# Patient Record
Sex: Female | Born: 1945 | Race: White | Hispanic: No | Smoking: Never smoker
Health system: Southern US, Community
[De-identification: ages and names within clinical notes are randomized; demographics above are authoritative.]

## PROBLEM LIST (undated history)

## (undated) DIAGNOSIS — T7840XA Allergy, unspecified, initial encounter: Secondary | ICD-10-CM

## (undated) DIAGNOSIS — C50919 Malignant neoplasm of unspecified site of unspecified female breast: Secondary | ICD-10-CM

## (undated) DIAGNOSIS — E079 Disorder of thyroid, unspecified: Secondary | ICD-10-CM

## (undated) DIAGNOSIS — K5790 Diverticulosis of intestine, part unspecified, without perforation or abscess without bleeding: Secondary | ICD-10-CM

## (undated) DIAGNOSIS — F419 Anxiety disorder, unspecified: Secondary | ICD-10-CM

## (undated) DIAGNOSIS — I499 Cardiac arrhythmia, unspecified: Secondary | ICD-10-CM

## (undated) DIAGNOSIS — R7303 Prediabetes: Secondary | ICD-10-CM

## (undated) DIAGNOSIS — G8929 Other chronic pain: Secondary | ICD-10-CM

## (undated) DIAGNOSIS — G4700 Insomnia, unspecified: Secondary | ICD-10-CM

## (undated) DIAGNOSIS — K219 Gastro-esophageal reflux disease without esophagitis: Secondary | ICD-10-CM

## (undated) DIAGNOSIS — M549 Dorsalgia, unspecified: Secondary | ICD-10-CM

## (undated) DIAGNOSIS — J3489 Other specified disorders of nose and nasal sinuses: Secondary | ICD-10-CM

## (undated) DIAGNOSIS — I480 Paroxysmal atrial fibrillation: Secondary | ICD-10-CM

## (undated) DIAGNOSIS — F329 Major depressive disorder, single episode, unspecified: Secondary | ICD-10-CM

## (undated) DIAGNOSIS — I428 Other cardiomyopathies: Secondary | ICD-10-CM

## (undated) DIAGNOSIS — R112 Nausea with vomiting, unspecified: Secondary | ICD-10-CM

## (undated) DIAGNOSIS — I509 Heart failure, unspecified: Secondary | ICD-10-CM

## (undated) DIAGNOSIS — B029 Zoster without complications: Secondary | ICD-10-CM

## (undated) DIAGNOSIS — R531 Weakness: Secondary | ICD-10-CM

## (undated) DIAGNOSIS — Z8719 Personal history of other diseases of the digestive system: Secondary | ICD-10-CM

## (undated) DIAGNOSIS — E039 Hypothyroidism, unspecified: Secondary | ICD-10-CM

## (undated) DIAGNOSIS — M1990 Unspecified osteoarthritis, unspecified site: Secondary | ICD-10-CM

## (undated) DIAGNOSIS — E669 Obesity, unspecified: Secondary | ICD-10-CM

## (undated) DIAGNOSIS — D649 Anemia, unspecified: Secondary | ICD-10-CM

## (undated) DIAGNOSIS — Z923 Personal history of irradiation: Secondary | ICD-10-CM

## (undated) DIAGNOSIS — M2550 Pain in unspecified joint: Secondary | ICD-10-CM

## (undated) DIAGNOSIS — Z9889 Other specified postprocedural states: Secondary | ICD-10-CM

## (undated) DIAGNOSIS — F32A Depression, unspecified: Secondary | ICD-10-CM

## (undated) DIAGNOSIS — Z973 Presence of spectacles and contact lenses: Secondary | ICD-10-CM

## (undated) DIAGNOSIS — R51 Headache: Secondary | ICD-10-CM

## (undated) DIAGNOSIS — E785 Hyperlipidemia, unspecified: Secondary | ICD-10-CM

## (undated) DIAGNOSIS — I10 Essential (primary) hypertension: Secondary | ICD-10-CM

## (undated) DIAGNOSIS — Z8619 Personal history of other infectious and parasitic diseases: Secondary | ICD-10-CM

## (undated) DIAGNOSIS — Z8711 Personal history of peptic ulcer disease: Secondary | ICD-10-CM

## (undated) HISTORY — DX: Anemia, unspecified: D64.9

## (undated) HISTORY — PX: CATARACT EXTRACTION BILATERAL W/ ANTERIOR VITRECTOMY: SHX1304

## (undated) HISTORY — DX: Obesity, unspecified: E66.9

## (undated) HISTORY — PX: TUBAL LIGATION: SHX77

## (undated) HISTORY — DX: Major depressive disorder, single episode, unspecified: F32.9

## (undated) HISTORY — DX: Hyperlipidemia, unspecified: E78.5

## (undated) HISTORY — DX: Allergy, unspecified, initial encounter: T78.40XA

## (undated) HISTORY — PX: ABDOMINAL HYSTERECTOMY: SHX81

## (undated) HISTORY — DX: Essential (primary) hypertension: I10

## (undated) HISTORY — PX: OTHER SURGICAL HISTORY: SHX169

## (undated) HISTORY — PX: KNEE ARTHROSCOPY: SUR90

## (undated) HISTORY — DX: Depression, unspecified: F32.A

## (undated) HISTORY — PX: CHOLECYSTECTOMY: SHX55

## (undated) HISTORY — PX: BACK SURGERY: SHX140

## (undated) HISTORY — PX: JOINT REPLACEMENT: SHX530

## (undated) HISTORY — DX: Anxiety disorder, unspecified: F41.9

## (undated) HISTORY — DX: Zoster without complications: B02.9

## (undated) HISTORY — PX: TONSILLECTOMY: SUR1361

## (undated) HISTORY — DX: Other cardiomyopathies: I42.8

## (undated) HISTORY — PX: FOOT TENDON SURGERY: SHX958

## (undated) HISTORY — DX: Gastro-esophageal reflux disease without esophagitis: K21.9

## (undated) HISTORY — DX: Unspecified osteoarthritis, unspecified site: M19.90

## (undated) HISTORY — PX: BREAST LUMPECTOMY: SHX2

## (undated) HISTORY — PX: UPPER GI ENDOSCOPY: SHX6162

## (undated) HISTORY — DX: Prediabetes: R73.03

## (undated) HISTORY — DX: Paroxysmal atrial fibrillation: I48.0

---

## 1998-06-23 ENCOUNTER — Ambulatory Visit (HOSPITAL_COMMUNITY): Admission: RE | Admit: 1998-06-23 | Discharge: 1998-06-23 | Payer: Self-pay | Admitting: *Deleted

## 1998-11-26 ENCOUNTER — Other Ambulatory Visit: Admission: RE | Admit: 1998-11-26 | Discharge: 1998-11-26 | Payer: Self-pay | Admitting: *Deleted

## 1999-01-19 ENCOUNTER — Other Ambulatory Visit: Admission: RE | Admit: 1999-01-19 | Discharge: 1999-01-19 | Payer: Self-pay | Admitting: Podiatry

## 1999-09-07 ENCOUNTER — Ambulatory Visit (HOSPITAL_COMMUNITY): Admission: RE | Admit: 1999-09-07 | Discharge: 1999-09-07 | Payer: Self-pay | Admitting: *Deleted

## 1999-12-30 ENCOUNTER — Other Ambulatory Visit: Admission: RE | Admit: 1999-12-30 | Discharge: 1999-12-30 | Payer: Self-pay | Admitting: *Deleted

## 2000-01-28 ENCOUNTER — Ambulatory Visit (HOSPITAL_COMMUNITY): Admission: RE | Admit: 2000-01-28 | Discharge: 2000-01-28 | Payer: Self-pay | Admitting: *Deleted

## 2003-01-15 ENCOUNTER — Encounter: Payer: Self-pay | Admitting: Gastroenterology

## 2003-07-01 ENCOUNTER — Ambulatory Visit (HOSPITAL_COMMUNITY): Admission: RE | Admit: 2003-07-01 | Discharge: 2003-07-01 | Payer: Self-pay | Admitting: Internal Medicine

## 2004-01-21 ENCOUNTER — Other Ambulatory Visit: Admission: RE | Admit: 2004-01-21 | Discharge: 2004-01-21 | Payer: Self-pay | Admitting: Internal Medicine

## 2004-01-27 ENCOUNTER — Ambulatory Visit (HOSPITAL_COMMUNITY): Admission: RE | Admit: 2004-01-27 | Discharge: 2004-01-27 | Payer: Self-pay | Admitting: Internal Medicine

## 2004-02-03 ENCOUNTER — Encounter: Admission: RE | Admit: 2004-02-03 | Discharge: 2004-02-03 | Payer: Self-pay | Admitting: Internal Medicine

## 2005-01-25 ENCOUNTER — Ambulatory Visit: Payer: Self-pay | Admitting: Cardiology

## 2005-01-28 ENCOUNTER — Encounter: Admission: RE | Admit: 2005-01-28 | Discharge: 2005-01-28 | Payer: Self-pay | Admitting: Internal Medicine

## 2005-02-09 ENCOUNTER — Ambulatory Visit: Payer: Self-pay

## 2005-02-11 ENCOUNTER — Encounter: Admission: RE | Admit: 2005-02-11 | Discharge: 2005-02-11 | Payer: Self-pay | Admitting: Internal Medicine

## 2005-04-11 HISTORY — PX: DUPUYTREN / PALMAR FASCIOTOMY: SUR601

## 2005-04-18 ENCOUNTER — Ambulatory Visit: Payer: Self-pay | Admitting: Cardiology

## 2005-10-20 ENCOUNTER — Encounter (INDEPENDENT_AMBULATORY_CARE_PROVIDER_SITE_OTHER): Payer: Self-pay | Admitting: Specialist

## 2005-10-20 ENCOUNTER — Ambulatory Visit (HOSPITAL_BASED_OUTPATIENT_CLINIC_OR_DEPARTMENT_OTHER): Admission: RE | Admit: 2005-10-20 | Discharge: 2005-10-20 | Payer: Self-pay | Admitting: Orthopedic Surgery

## 2006-02-22 ENCOUNTER — Encounter: Admission: RE | Admit: 2006-02-22 | Discharge: 2006-02-22 | Payer: Self-pay | Admitting: Internal Medicine

## 2006-03-27 ENCOUNTER — Ambulatory Visit: Payer: Self-pay | Admitting: Cardiology

## 2006-08-10 ENCOUNTER — Encounter: Admission: RE | Admit: 2006-08-10 | Discharge: 2006-08-10 | Payer: Self-pay | Admitting: Internal Medicine

## 2006-08-15 ENCOUNTER — Encounter: Payer: Self-pay | Admitting: Internal Medicine

## 2006-08-15 ENCOUNTER — Ambulatory Visit: Payer: Self-pay

## 2007-02-26 ENCOUNTER — Encounter: Admission: RE | Admit: 2007-02-26 | Discharge: 2007-02-26 | Payer: Self-pay | Admitting: Internal Medicine

## 2007-03-01 ENCOUNTER — Encounter: Admission: RE | Admit: 2007-03-01 | Discharge: 2007-03-01 | Payer: Self-pay | Admitting: Internal Medicine

## 2007-05-04 ENCOUNTER — Ambulatory Visit (HOSPITAL_BASED_OUTPATIENT_CLINIC_OR_DEPARTMENT_OTHER): Admission: RE | Admit: 2007-05-04 | Discharge: 2007-05-04 | Payer: Self-pay | Admitting: Orthopedic Surgery

## 2008-02-27 ENCOUNTER — Encounter: Admission: RE | Admit: 2008-02-27 | Discharge: 2008-02-27 | Payer: Self-pay | Admitting: Internal Medicine

## 2008-03-11 ENCOUNTER — Encounter: Payer: Self-pay | Admitting: Physician Assistant

## 2008-03-13 ENCOUNTER — Ambulatory Visit: Payer: Self-pay | Admitting: Cardiology

## 2008-03-13 ENCOUNTER — Telehealth: Payer: Self-pay | Admitting: Gastroenterology

## 2008-03-13 ENCOUNTER — Ambulatory Visit: Payer: Self-pay | Admitting: Gastroenterology

## 2008-03-13 DIAGNOSIS — I447 Left bundle-branch block, unspecified: Secondary | ICD-10-CM | POA: Insufficient documentation

## 2008-03-13 DIAGNOSIS — I10 Essential (primary) hypertension: Secondary | ICD-10-CM | POA: Insufficient documentation

## 2008-03-13 DIAGNOSIS — Z8679 Personal history of other diseases of the circulatory system: Secondary | ICD-10-CM

## 2008-03-13 DIAGNOSIS — E782 Mixed hyperlipidemia: Secondary | ICD-10-CM | POA: Insufficient documentation

## 2008-03-13 DIAGNOSIS — J309 Allergic rhinitis, unspecified: Secondary | ICD-10-CM | POA: Insufficient documentation

## 2008-03-13 DIAGNOSIS — F3341 Major depressive disorder, recurrent, in partial remission: Secondary | ICD-10-CM | POA: Insufficient documentation

## 2008-03-13 DIAGNOSIS — K219 Gastro-esophageal reflux disease without esophagitis: Secondary | ICD-10-CM

## 2008-03-13 LAB — CONVERTED CEMR LAB
Amylase: 42 units/L (ref 27–131)
BUN: 14 mg/dL (ref 6–23)
Basophils Absolute: 0 10*3/uL (ref 0.0–0.1)
Basophils Relative: 0 % (ref 0.0–3.0)
CO2: 30 meq/L (ref 19–32)
Calcium: 8.9 mg/dL (ref 8.4–10.5)
Chloride: 99 meq/L (ref 96–112)
Creatinine, Ser: 0.9 mg/dL (ref 0.4–1.2)
Eosinophils Absolute: 0.1 10*3/uL (ref 0.0–0.7)
Eosinophils Relative: 0.6 % (ref 0.0–5.0)
GFR calc Af Amer: 82 mL/min
GFR calc non Af Amer: 68 mL/min
Glucose, Bld: 98 mg/dL (ref 70–99)
HCT: 31.9 % — ABNORMAL LOW (ref 36.0–46.0)
Hemoglobin: 10.9 g/dL — ABNORMAL LOW (ref 12.0–15.0)
Lipase: 29 units/L (ref 11.0–59.0)
Lymphocytes Relative: 15.6 % (ref 12.0–46.0)
MCHC: 34.2 g/dL (ref 30.0–36.0)
MCV: 85 fL (ref 78.0–100.0)
Monocytes Absolute: 1 10*3/uL (ref 0.1–1.0)
Monocytes Relative: 7 % (ref 3.0–12.0)
Neutro Abs: 10.9 10*3/uL — ABNORMAL HIGH (ref 1.4–7.7)
Neutrophils Relative %: 76.8 % (ref 43.0–77.0)
Platelets: 372 10*3/uL (ref 150–400)
Potassium: 3.2 meq/L — ABNORMAL LOW (ref 3.5–5.1)
RBC: 3.75 M/uL — ABNORMAL LOW (ref 3.87–5.11)
RDW: 13.8 % (ref 11.5–14.6)
Sodium: 137 meq/L (ref 135–145)
WBC: 14.2 10*3/uL — ABNORMAL HIGH (ref 4.5–10.5)

## 2008-03-14 ENCOUNTER — Ambulatory Visit: Payer: Self-pay | Admitting: Internal Medicine

## 2008-03-19 ENCOUNTER — Telehealth: Payer: Self-pay | Admitting: Physician Assistant

## 2008-03-20 ENCOUNTER — Telehealth (INDEPENDENT_AMBULATORY_CARE_PROVIDER_SITE_OTHER): Payer: Self-pay | Admitting: *Deleted

## 2008-03-21 ENCOUNTER — Ambulatory Visit (HOSPITAL_COMMUNITY): Admission: RE | Admit: 2008-03-21 | Discharge: 2008-03-21 | Payer: Self-pay | Admitting: Internal Medicine

## 2008-03-27 ENCOUNTER — Ambulatory Visit: Payer: Self-pay | Admitting: Internal Medicine

## 2008-03-27 ENCOUNTER — Encounter: Payer: Self-pay | Admitting: Physician Assistant

## 2008-03-27 DIAGNOSIS — Z8719 Personal history of other diseases of the digestive system: Secondary | ICD-10-CM

## 2008-03-27 HISTORY — DX: Personal history of other diseases of the digestive system: Z87.19

## 2008-03-28 ENCOUNTER — Encounter: Payer: Self-pay | Admitting: Gastroenterology

## 2008-03-31 ENCOUNTER — Ambulatory Visit: Payer: Self-pay | Admitting: Gastroenterology

## 2008-03-31 LAB — CONVERTED CEMR LAB
ALT: 24 units/L (ref 0–35)
AST: 20 units/L (ref 0–37)
Albumin: 3.2 g/dL — ABNORMAL LOW (ref 3.5–5.2)
Alkaline Phosphatase: 95 units/L (ref 39–117)
Amylase: 47 units/L (ref 27–131)
Basophils Absolute: 0.1 10*3/uL (ref 0.0–0.1)
Basophils Relative: 1.2 % (ref 0.0–3.0)
Bilirubin, Direct: 0.1 mg/dL (ref 0.0–0.3)
Eosinophils Absolute: 0.1 10*3/uL (ref 0.0–0.7)
Eosinophils Relative: 1.8 % (ref 0.0–5.0)
HCT: 27.5 % — ABNORMAL LOW (ref 36.0–46.0)
Hemoglobin: 9.4 g/dL — ABNORMAL LOW (ref 12.0–15.0)
Lipase: 36 units/L (ref 11.0–59.0)
Lymphocytes Relative: 26.1 % (ref 12.0–46.0)
MCHC: 34.2 g/dL (ref 30.0–36.0)
MCV: 84.2 fL (ref 78.0–100.0)
Monocytes Absolute: 0.6 10*3/uL (ref 0.1–1.0)
Monocytes Relative: 7.1 % (ref 3.0–12.0)
Neutro Abs: 5.1 10*3/uL (ref 1.4–7.7)
Neutrophils Relative %: 63.8 % (ref 43.0–77.0)
Platelets: 345 10*3/uL (ref 150–400)
RBC: 3.26 M/uL — ABNORMAL LOW (ref 3.87–5.11)
RDW: 14 % (ref 11.5–14.6)
Total Bilirubin: 0.2 mg/dL — ABNORMAL LOW (ref 0.3–1.2)
Total Protein: 6.6 g/dL (ref 6.0–8.3)
WBC: 8 10*3/uL (ref 4.5–10.5)

## 2008-04-01 ENCOUNTER — Encounter: Payer: Self-pay | Admitting: Gastroenterology

## 2008-04-01 ENCOUNTER — Ambulatory Visit: Payer: Self-pay | Admitting: Gastroenterology

## 2008-04-01 LAB — CONVERTED CEMR LAB
Basophils Absolute: 0.1 10*3/uL (ref 0.0–0.1)
Basophils Relative: 1.1 % (ref 0.0–3.0)
Eosinophils Absolute: 0.1 10*3/uL (ref 0.0–0.7)
Eosinophils Relative: 1.3 % (ref 0.0–5.0)
Ferritin: 20.7 ng/mL (ref 10.0–291.0)
Folate: 6.8 ng/mL
HCT: 29.8 % — ABNORMAL LOW (ref 36.0–46.0)
Hemoglobin: 10.2 g/dL — ABNORMAL LOW (ref 12.0–15.0)
Iron: 41 ug/dL — ABNORMAL LOW (ref 42–145)
Lymphocytes Relative: 24.8 % (ref 12.0–46.0)
MCHC: 34.1 g/dL (ref 30.0–36.0)
MCV: 86.4 fL (ref 78.0–100.0)
Monocytes Absolute: 0.5 10*3/uL (ref 0.1–1.0)
Monocytes Relative: 5.7 % (ref 3.0–12.0)
Neutro Abs: 5.7 10*3/uL (ref 1.4–7.7)
Neutrophils Relative %: 67.1 % (ref 43.0–77.0)
Platelets: 356 10*3/uL (ref 150–400)
RBC: 3.45 M/uL — ABNORMAL LOW (ref 3.87–5.11)
RDW: 14.3 % (ref 11.5–14.6)
Saturation Ratios: 9.1 % — ABNORMAL LOW (ref 20.0–50.0)
Transferrin: 323 mg/dL (ref >=212.0)
Vitamin B-12: 244 pg/mL (ref 211–911)
WBC: 8.5 10*3/uL (ref 4.5–10.5)

## 2008-04-02 ENCOUNTER — Telehealth (INDEPENDENT_AMBULATORY_CARE_PROVIDER_SITE_OTHER): Payer: Self-pay | Admitting: *Deleted

## 2008-04-03 ENCOUNTER — Encounter: Payer: Self-pay | Admitting: Gastroenterology

## 2008-04-10 ENCOUNTER — Ambulatory Visit (HOSPITAL_COMMUNITY): Admission: RE | Admit: 2008-04-10 | Discharge: 2008-04-10 | Payer: Self-pay | Admitting: Gastroenterology

## 2008-04-14 ENCOUNTER — Encounter: Payer: Self-pay | Admitting: Gastroenterology

## 2008-04-17 ENCOUNTER — Ambulatory Visit (HOSPITAL_COMMUNITY): Admission: RE | Admit: 2008-04-17 | Discharge: 2008-04-17 | Payer: Self-pay | Admitting: Gastroenterology

## 2008-04-17 ENCOUNTER — Ambulatory Visit: Payer: Self-pay | Admitting: Gastroenterology

## 2008-04-23 ENCOUNTER — Ambulatory Visit: Payer: Self-pay | Admitting: Gastroenterology

## 2008-04-23 ENCOUNTER — Encounter (INDEPENDENT_AMBULATORY_CARE_PROVIDER_SITE_OTHER): Payer: Self-pay | Admitting: *Deleted

## 2008-04-23 LAB — CONVERTED CEMR LAB
Amylase: 40 units/L (ref 27–131)
Basophils Absolute: 0.1 10*3/uL (ref 0.0–0.1)
Basophils Relative: 1.1 % (ref 0.0–3.0)
Eosinophils Absolute: 0.2 10*3/uL (ref 0.0–0.7)
Eosinophils Relative: 2.4 % (ref 0.0–5.0)
HCT: 29.3 % — ABNORMAL LOW (ref 36.0–46.0)
Hemoglobin: 9.9 g/dL — ABNORMAL LOW (ref 12.0–15.0)
Lymphocytes Relative: 22.9 % (ref 12.0–46.0)
MCHC: 33.8 g/dL (ref 30.0–36.0)
MCV: 85.2 fL (ref 78.0–100.0)
Monocytes Absolute: 0.6 10*3/uL (ref 0.1–1.0)
Monocytes Relative: 7.5 % (ref 3.0–12.0)
Neutro Abs: 5.6 10*3/uL (ref 1.4–7.7)
Neutrophils Relative %: 66.1 % (ref 43.0–77.0)
Platelets: 292 10*3/uL (ref 150–400)
RBC: 3.44 M/uL — ABNORMAL LOW (ref 3.87–5.11)
RDW: 14.3 % (ref 11.5–14.6)
WBC: 8.4 10*3/uL (ref 4.5–10.5)

## 2008-04-25 ENCOUNTER — Ambulatory Visit: Payer: Self-pay | Admitting: Gastroenterology

## 2008-04-25 LAB — CONVERTED CEMR LAB
Fecal Occult Blood: NEGATIVE
OCCULT 1: NEGATIVE
OCCULT 2: NEGATIVE
OCCULT 3: NEGATIVE
OCCULT 4: NEGATIVE
OCCULT 5: NEGATIVE

## 2008-06-06 ENCOUNTER — Ambulatory Visit: Payer: Self-pay | Admitting: Gastroenterology

## 2008-06-06 LAB — CONVERTED CEMR LAB
ALT: 16 units/L (ref 0–35)
AST: 19 units/L (ref 0–37)
Albumin: 3.5 g/dL (ref 3.5–5.2)
Alkaline Phosphatase: 102 units/L (ref 39–117)
Amylase: 49 units/L (ref 27–131)
Basophils Absolute: 0.1 10*3/uL (ref 0.0–0.1)
Basophils Relative: 1.5 % (ref 0.0–3.0)
Bilirubin, Direct: 0.1 mg/dL (ref 0.0–0.3)
Eosinophils Absolute: 0.2 10*3/uL (ref 0.0–0.7)
Eosinophils Relative: 2.2 % (ref 0.0–5.0)
HCT: 32 % — ABNORMAL LOW (ref 36.0–46.0)
Hemoglobin: 10.8 g/dL — ABNORMAL LOW (ref 12.0–15.0)
Lipase: 26 units/L (ref 11.0–59.0)
Lymphocytes Relative: 22.3 % (ref 12.0–46.0)
MCHC: 33.7 g/dL (ref 30.0–36.0)
MCV: 84.3 fL (ref 78.0–100.0)
Monocytes Absolute: 0.7 10*3/uL (ref 0.1–1.0)
Monocytes Relative: 7.5 % (ref 3.0–12.0)
Neutro Abs: 5.8 10*3/uL (ref 1.4–7.7)
Neutrophils Relative %: 66.5 % (ref 43.0–77.0)
Platelets: 300 10*3/uL (ref 150–400)
RBC: 3.79 M/uL — ABNORMAL LOW (ref 3.87–5.11)
RDW: 14.1 % (ref 11.5–14.6)
Total Bilirubin: 0.5 mg/dL (ref 0.3–1.2)
Total Protein: 7.4 g/dL (ref 6.0–8.3)
WBC: 8.8 10*3/uL (ref 4.5–10.5)

## 2008-06-09 ENCOUNTER — Encounter: Payer: Self-pay | Admitting: Gastroenterology

## 2008-06-09 ENCOUNTER — Ambulatory Visit: Payer: Self-pay | Admitting: Cardiovascular Disease

## 2008-07-09 ENCOUNTER — Ambulatory Visit: Payer: Self-pay | Admitting: Gastroenterology

## 2008-09-16 ENCOUNTER — Encounter (INDEPENDENT_AMBULATORY_CARE_PROVIDER_SITE_OTHER): Payer: Self-pay | Admitting: *Deleted

## 2008-10-20 ENCOUNTER — Ambulatory Visit: Payer: Self-pay | Admitting: Gastroenterology

## 2008-11-03 ENCOUNTER — Ambulatory Visit (HOSPITAL_COMMUNITY): Admission: RE | Admit: 2008-11-03 | Discharge: 2008-11-03 | Payer: Self-pay | Admitting: Gastroenterology

## 2008-11-05 ENCOUNTER — Ambulatory Visit: Payer: Self-pay | Admitting: Gastroenterology

## 2008-11-25 ENCOUNTER — Encounter: Payer: Self-pay | Admitting: Gastroenterology

## 2009-03-12 ENCOUNTER — Other Ambulatory Visit: Admission: RE | Admit: 2009-03-12 | Discharge: 2009-03-12 | Payer: Self-pay | Admitting: Internal Medicine

## 2009-03-12 ENCOUNTER — Encounter: Payer: Self-pay | Admitting: Nurse Practitioner

## 2009-03-19 ENCOUNTER — Encounter: Admission: RE | Admit: 2009-03-19 | Discharge: 2009-03-19 | Payer: Self-pay | Admitting: Internal Medicine

## 2009-06-09 ENCOUNTER — Encounter: Payer: Self-pay | Admitting: Nurse Practitioner

## 2009-06-16 ENCOUNTER — Encounter: Payer: Self-pay | Admitting: Nurse Practitioner

## 2009-06-19 ENCOUNTER — Telehealth: Payer: Self-pay | Admitting: Gastroenterology

## 2009-06-22 ENCOUNTER — Ambulatory Visit: Payer: Self-pay | Admitting: Gastroenterology

## 2009-07-29 ENCOUNTER — Ambulatory Visit: Payer: Self-pay | Admitting: Gastroenterology

## 2009-08-04 ENCOUNTER — Encounter: Payer: Self-pay | Admitting: Gastroenterology

## 2009-08-19 ENCOUNTER — Ambulatory Visit (HOSPITAL_COMMUNITY): Admission: RE | Admit: 2009-08-19 | Discharge: 2009-08-19 | Payer: Self-pay | Admitting: General Surgery

## 2009-08-31 ENCOUNTER — Encounter: Payer: Self-pay | Admitting: Gastroenterology

## 2010-03-30 ENCOUNTER — Ambulatory Visit (HOSPITAL_COMMUNITY)
Admission: RE | Admit: 2010-03-30 | Discharge: 2010-03-30 | Payer: Self-pay | Source: Home / Self Care | Attending: Internal Medicine | Admitting: Internal Medicine

## 2010-05-02 ENCOUNTER — Encounter: Payer: Self-pay | Admitting: Internal Medicine

## 2010-05-13 NOTE — Letter (Signed)
Summary: [facility]  [facility]   Imported By: Lester [place] 06/24/2009 09:57:10  _____________________________________________________________________  External Attachment:    Type:   Image     Comment:   External Document

## 2010-05-13 NOTE — Assessment & Plan Note (Signed)
Summary: DIARRHEA X2WKS/YF               (DR.KAPLAN PT.)   History of Present Illness Visit Type: Follow-up Visit Primary GI MD: Melvia Heaps MD [facility] Primary Provider: Marisue Brooklyn, DO Requesting Provider: NA Chief Complaint: diarrhea x 2 weeks that has stopped with medication, stool were bright pink in color History of Present Illness:   Presents with two week history of diarrhea.  We gave patient samples of pancreatic enzymes in July after she presented with diarrhea following episode of acute pancreatitis. Diarrhea resolved, she discontinued enzymes. For at least a year now patient has continued to have intermittent epigastric discomfort radiating through to her back. Pain not any worse nor are episodes any more frequent than when she saw Dr. Arlyce Dice several months ago. Over the last few months has had occasional loose stool but two weeks ago developed increased abdominal pain and diarrhea. She had low grade fevers and just didn't feel well. No recent antibiotics. No sick contacts. Patient saw her PCP and LFTs, CBC and Lipase were normal.  Friday she restarted Creon and diarrhea has completely resolved.      GI Review of Systems    Reports bloating.     Location of  Abdominal pain: upper abdomen.    Denies abdominal pain, acid reflux, belching, chest pain, dysphagia with liquids, dysphagia with solids, heartburn, loss of appetite, nausea, vomiting, vomiting blood, weight loss, and  weight gain.        Denies anal fissure, black tarry stools, change in bowel habit, constipation, diarrhea, diverticulosis, fecal incontinence, heme positive stool, hemorrhoids, irritable bowel syndrome, jaundice, light color stool, liver problems, rectal bleeding, and  rectal pain.   Current Medications (verified): 1)  Verapamil Hcl Cr 180 Mg Cr-Tabs (Verapamil Hcl) .... Take 1 Tablet By Mouth Once A Day 2)  Temazepam 30 Mg Caps (Temazepam) .... Take 1 Tab By Mouth At Bedtime 3)  Doxazosin Mesylate 2 Mg Tabs  (Doxazosin Mesylate) .... Take 1 1/2 Tablet By Mouth Once Daily 4)  Omeprazole 20 Mg Cpdr (Omeprazole) .... Take 1 Tab Daily 5)  Paxil Cr 25 Mg Xr24h-Tab (Paroxetine Hcl) .... Take 1 Daily 6)  Promethazine Hcl 25 Mg Tabs (Promethazine Hcl) .... Take 1 Tab Every 4-6 Hours For Nausea and Vomiting 7)  Tramadol Hcl 50 Mg Tabs (Tramadol Hcl) .... Take 2 Tablets By Mouth Once Daily 8)  Vitamin D 2000 Unit Tabs (Cholecalciferol) .... Take 3 Tablets By Mouth Once Daily 9)  Creon 24000 Unit Cpep (Pancrelipase (Lip-Prot-Amyl)) .... Take One By Mouth With Meals 10)  Hyomax-Dt 0.375 Mg Cr-Tabs (Hyoscyamine Sulfate) .... Take One By Mouth Two Times A Day 11)  Flax Seed Oil 1000 Mg Caps (Flaxseed (Linseed)) .... Take 2 Tablet By Mouth Daily  Allergies (verified): 1)  ! Morphine Sulfate Cr (Morphine Sulfate) 2)  ! * Minocycline  Past History:  Past Medical History: Hx of LEFT BUNDLE BRANCH BLOCK (ICD-426.3) Family Hx of CARDIOVASCULAR DISEASE (ICD-429.2) DYSLIPIDEMIA (ICD-272.4) HYPERTENSION (ICD-401.9) DIVERTICULOSIS, COLON (ICD-562.10) ACUTE PANCREATITS 2009 (? thiazides) DEPRESSION  Past Surgical History: Reviewed history from 06/06/2008 and no changes required. Total abs hysterectomy with bilateral salpingo-oophorectomy Tonsillectomy right foot surgery x 2 right hand surgery x 1 trigger finger release-right hand 5 digit -left hand surgery x 3 right knee arthroscopy dental surgery lumpectomy-left breast-benign  Family History: Reviewed history from 06/06/2008 and no changes required. Family History of Breast Cancer:sister No FH of Colon Cancer: Family History of Diabetes: Brother, Sister x  2 Family History of Heart Disease: Mother Family History of Kidney Disease: Father  Social History: Reviewed history from 06/06/2008 and no changes required. Occupation: unemployed Patient has never smoked.  Alcohol Use - yes social Daily Caffeine Use 1 per day Illicit Drug Use - no Patient  does not get regular exercise.   Review of Systems       The patient complains of allergy/sinus, arthritis/joint pain, back pain, cough, fever, headaches-new, night sweats, sleeping problems, swelling of feet/legs, thirst - excessive, and urine leakage.  The patient denies anemia, anxiety-new, blood in urine, breast changes/lumps, change in vision, confusion, depression-new, fainting, fatigue, hearing problems, heart murmur, heart rhythm changes, itching, menstrual pain, muscle pains/cramps, nosebleeds, pregnancy symptoms, shortness of breath, skin rash, sore throat, swollen lymph glands, thirst - excessive , urination - excessive , urination changes/pain, vision changes, and voice change.    Vital Signs:  Patient profile:   65 year old female Height:      66 inches Weight:      233 pounds Pulse rate:   130 / minute Pulse rhythm:   regular BP sitting:   124 / 78  (left arm) Cuff size:   regular  Vitals Entered By: June McMurray CMA Duncan Dull) (June 22, 2009 11:08 AM)  Physical Exam  General:  Well developed, well nourished, no acute distress. Lungs:  Clear throughout to auscultation. Heart:  Regular rate and rhythm; no murmurs, rubs,  or bruits. Abdomen:  Abdomen soft,  nondistended, mild epigastric and LUQ tenderness to palpation. No obvious masses or hepatomegaly.Normal bowel sounds.  Neurologic:  Alert and  oriented x4;  grossly normal neurologically. Psych:  Alert and cooperative. Normal mood and affect.  Impression & Recommendations:  Problem # 1:  DIARRHEA (ICD-787.91) Assessment Improved Recent episode of diarrhea, abdominal pain, low grade fevers and malaise. Saw PCP and LFTs, Amylase, Lipase, BMET and UA unrmarkable. Hgb stable at 10. 2.  WBC normal. Minimally elevated ESR / CRP.  We called in Creon on Friday and she feels much better.  Patient may of had a viral illness which would have improved on its own. Not sure why pancreatic enzymes would help in the absence of chronic  pancreatitis / malabsorption.  I recommended weaning off Creon to see if diarrhea returns. See #2.  Problem # 2:  Hx of PANCREATITIS, HX OF (ICD-V12.70) Episode Dec. 2009 felt to be secondary to thiazides. She continues to have intermittent epigastric discomfort but nothing severe. She will follow up with Dr. Arlyce Dice in a few weeks. In the meantime, should the patient have any episodes of severe epigastric pain we will obtain CTscan. In the event of recurrent pancreatitis condsider MRCP or empric cholecystecomy for possible microlithiasis.  Patient Instructions: 1)  Over the next 7-10 days discontinue Creon. If diarrhea returns then resume medication. 2)  We made you a follow up appointment with Dr. Arlyce Dice for 07-29-09 at 9:45am.   3)  If upper abdominal pain increases, call our office for labs and CTscan.  4)  Copy sent to : Marisue Brooklyn, DO 5)  The medication list was reviewed and reconciled.  All changed / newly prescribed medications were explained.  A complete medication list was provided to the patient / caregiver.

## 2010-05-13 NOTE — Progress Notes (Signed)
Summary: TRIAGE: Diarrhea x2wks  Phone Note From Other Clinic Call back at [facility] [place] Phone [phone]   Caller: Darel Hong @ Dr Hardie Pulley office [phone] Call For: Dr Arlyce Dice Reason for Call: Schedule Patient Appt Summary of Call: Diarrhea x2wks. Would like pt worked in sooner than next available on 07-15-09. Would like for [place] to call patient back directly since she is established with [place] already and Dr Hardie Pulley office is only open half day today. Initial call taken by: Leanor Kail [facility],  June 19, 2009 9:27 AM  Follow-up for Phone Call        Last OV 10/2008.  For 3 weeks pt. states that every other day she gets up with diarrhea, stools are loose to watery, "Sometimes it's like an explosion"  Fever on tuesday at Dr.Stevenson's. Some nausea. Denies blood, black stools. Takes Hyomax two times a day, as needed.   1) Begin Hyomax two times a day for 5 days 2) Immodium QAM and PRN 3) See Willette Cluster NP on 06-22-09 at 11am 4) If symptoms become worse call back immediately or go to ER.  Follow-up by: Laureen Ochs LPN,  June 19, 2009 9:51 AM  Additional Follow-up for Phone Call Additional follow up Details #1::        she should restart creon if not already taking it Additional Follow-up by: Louis Meckel MD,  June 19, 2009 10:04 AM    Additional Follow-up for Phone Call Additional follow up Details #2::    Above MD orders reviewed with patient. Refill to her pharmacy. Pt. to keep scheduled office visit. Pt. instructed to call back as needed.  Follow-up by: Laureen Ochs LPN,  June 19, 2009 10:49 AM  Prescriptions: CREON 24000 UNIT CPEP (PANCRELIPASE (LIP-PROT-AMYL)) take one by mouth with meals  #90 x 3   Entered by:   Laureen Ochs LPN   Authorized by:   Louis Meckel MD   Signed by:   Laureen Ochs LPN on 46/96/2952   Method used:   Electronically to        [facility]  [phone]* (retail)       [address] [place], [place]  [address]       Ph:  [phone] or [phone]       Fax: [phone]   RxID:   [id number]

## 2010-05-13 NOTE — Letter (Signed)
Summary: [facility]   Imported By: Sherian Rein 08/18/2009 09:36:42  _____________________________________________________________________  External Attachment:    Type:   Image     Comment:   External Document

## 2010-05-13 NOTE — Letter (Signed)
Summary: [facility]   Imported By: Lester Vergas 09/23/2009 09:50:44  _____________________________________________________________________  External Attachment:    Type:   Image     Comment:   External Document

## 2010-05-13 NOTE — Assessment & Plan Note (Signed)
Summary: F/U Diarrhea, saw NP   History of Present Illness Visit Type: Follow-up Visit Primary GI MD: Melvia Heaps MD [facility] Primary Provider: Marisue Brooklyn, DO Requesting Provider: NA Chief Complaint: F/u diarrhea. Pt states diarrhea is better but she is having upper abd pain that radiates to her back  History of Present Illness:   Mrs. Neace has returned for followup of her abdominal pain and diarrhea.  Diarrhea has subsided.  She continues to complain of moderately severe midepigastric pain that radiates to her back.  It will occasionally awaken her.  Pain subsides with hyomax.  It may last up to 4 hours at a time.  Prior HIDA scan and ultrasound were negative.     GI Review of Systems    Reports abdominal pain.     Location of  Abdominal pain: upper abdomen.    Denies acid reflux, belching, bloating, chest pain, dysphagia with liquids, dysphagia with solids, heartburn, loss of appetite, nausea, vomiting, vomiting blood, weight loss, and  weight gain.        Denies anal fissure, black tarry stools, change in bowel habit, constipation, diarrhea, diverticulosis, fecal incontinence, heme positive stool, hemorrhoids, irritable bowel syndrome, jaundice, light color stool, liver problems, rectal bleeding, and  rectal pain.    Current Medications (verified): 1)  Verapamil Hcl Cr 180 Mg Cr-Tabs (Verapamil Hcl) .... Take 1 Tablet By Mouth Once A Day 2)  Temazepam 30 Mg Caps (Temazepam) .... Take 1 Tab By Mouth At Bedtime 3)  Doxazosin Mesylate 2 Mg Tabs (Doxazosin Mesylate) .... Take 1 1/2 Tablet By Mouth Once Daily 4)  Omeprazole 20 Mg Cpdr (Omeprazole) .... Take 1 Tab Daily 5)  Paxil Cr 25 Mg Xr24h-Tab (Paroxetine Hcl) .... Take 1 Daily 6)  Promethazine Hcl 25 Mg Tabs (Promethazine Hcl) .... Take 1 Tab Every 4-6 Hours For Nausea and Vomiting 7)  Tramadol Hcl 50 Mg Tabs (Tramadol Hcl) .... Take 2 Tablets By Mouth Once Daily 8)  Vitamin D 2000 Unit Tabs (Cholecalciferol) .... Take 3 Tablets  By Mouth Once Daily 9)  Creon 24000 Unit Cpep (Pancrelipase (Lip-Prot-Amyl)) .... Take One By Mouth With Meals 10)  Hyomax-Dt 0.375 Mg Cr-Tabs (Hyoscyamine Sulfate) .... Take One By Mouth Two Times A Day 11)  Flax Seed Oil 1000 Mg Caps (Flaxseed (Linseed)) .... Take 2 Tablet By Mouth Daily  Allergies (verified): 1)  ! Morphine Sulfate Cr (Morphine Sulfate) 2)  ! * Minocycline  Past History:  Past Medical History: Reviewed history from 06/22/2009 and no changes required. Hx of LEFT BUNDLE BRANCH BLOCK (ICD-426.3) Family Hx of CARDIOVASCULAR DISEASE (ICD-429.2) DYSLIPIDEMIA (ICD-272.4) HYPERTENSION (ICD-401.9) DIVERTICULOSIS, COLON (ICD-562.10) ACUTE PANCREATITS 2009 (? thiazides) DEPRESSION  Past Surgical History: Reviewed history from 06/06/2008 and no changes required. Total abs hysterectomy with bilateral salpingo-oophorectomy Tonsillectomy right foot surgery x 2 right hand surgery x 1 trigger finger release-right hand 5 digit -left hand surgery x 3 right knee arthroscopy dental surgery lumpectomy-left breast-benign  Family History: Reviewed history from 06/06/2008 and no changes required. Family History of Breast Cancer:sister No FH of Colon Cancer: Family History of Diabetes: Brother, Sister x 2 Family History of Heart Disease: Mother Family History of Kidney Disease: Father  Social History: Reviewed history from 06/06/2008 and no changes required. Occupation: unemployed Patient has never smoked.  Alcohol Use - yes social Daily Caffeine Use 1 per day Illicit Drug Use - no Patient does not get regular exercise.   Review of Systems       The patient complains  of back pain.  The patient denies allergy/sinus, anemia, anxiety-new, arthritis/joint pain, blood in urine, breast changes/lumps, change in vision, confusion, cough, coughing up blood, depression-new, fainting, fatigue, fever, headaches-new, hearing problems, heart murmur, heart rhythm changes, itching,  menstrual pain, muscle pains/cramps, night sweats, nosebleeds, pregnancy symptoms, shortness of breath, skin rash, sleeping problems, sore throat, swelling of feet/legs, swollen lymph glands, thirst - excessive , urination - excessive , urination changes/pain, urine leakage, vision changes, and voice change.    Vital Signs:  Patient profile:   65 year old female Height:      66 inches Weight:      233 pounds BMI:     37.74 BSA:     2.14 Pulse rate:   104 / minute Pulse rhythm:   regular BP sitting:   140 / 82  (left arm) Cuff size:   regular  Vitals Entered By: Ok Anis CMA (July 29, 2009 9:43 AM)  Physical Exam  Additional Exam:  exam she is well-developed well-nourished female  Abdomen is without masses organomegaly.  There is mild midepigastric tenderness to deep palpation   Impression & Recommendations:  Problem # 1:  EPIGASTRIC PAIN (ICD-789.06)  Pain could be due to acalculous cholecystitis.  A bile duct stone is a consideration.  History is noteworthy for pancreatitis that was thought to be due to thiazides.  EUS was normal.  Microlithiasis is also a concern.  Her response to hyomax, however, renders biliary tract disease less likely or at least atypical.  Recommendations #1 surgical consult for consideration of a cholecystectomy.  An intraoperative cholangiogram would also be important to rule out choledocholithiasis  Orders: [facility] ([facility])  Problem # 2:  DIARRHEA (ICD-787.91) Assessment: Improved  Patient Instructions: 1)  We will contact you with your appointment to [facility] 2)  The medication list was reviewed and reconciled.  All changed / newly prescribed medications were explained.  A complete medication list was provided to the patient / caregiver. 3)  Your appointment with [facility] is scheduled for 08/18/2009 at 10:20am 4)  cc Amy Elisabeth Most, DO 5)  cc Marcelino Freestone, MD Prescriptions: HYOMAX-SL 0.125 MG SUBL (HYOSCYAMINE SULFATE) take 2 tabs  sublingual q.4 h. p.r.n. abdominal pain  #25 x 2   Entered and Authorized by:   Louis Meckel MD   Signed by:   Louis Meckel MD on 07/29/2009   Method used:   [facility] to        [facility]  [phone]* (retail)       [address] [place], [place]  [address]       Ph: [phone] or [phone]       Fax: [phone]   RxID:   [id number]

## 2010-06-29 LAB — DIFFERENTIAL
Basophils Absolute: 0.1 10*3/uL (ref 0.0–0.1)
Basophils Relative: 1 % (ref 0–1)
Monocytes Absolute: 0.6 10*3/uL (ref 0.1–1.0)
Neutro Abs: 4 10*3/uL (ref 1.7–7.7)
Neutrophils Relative %: 64 % (ref 43–77)

## 2010-06-29 LAB — COMPREHENSIVE METABOLIC PANEL
Alkaline Phosphatase: 106 U/L (ref 39–117)
BUN: 11 mg/dL (ref 6–23)
Chloride: 106 mEq/L (ref 96–112)
GFR calc non Af Amer: >60 mL/min (ref >60)
Glucose, Bld: 103 mg/dL — ABNORMAL HIGH (ref 70–99)
Potassium: 5.4 mEq/L — ABNORMAL HIGH (ref 3.5–5.1)
Total Bilirubin: 1 mg/dL (ref 0.3–1.2)

## 2010-06-29 LAB — CBC
HCT: 31.2 % — ABNORMAL LOW (ref 36.0–46.0)
Hemoglobin: 10.6 g/dL — ABNORMAL LOW (ref 12.0–15.0)
MCV: 82.2 fL (ref 78.0–100.0)
WBC: 6.3 10*3/uL (ref 4.0–10.5)

## 2010-06-29 LAB — POTASSIUM: Potassium: 3.7 mEq/L (ref 3.5–5.1)

## 2010-08-24 NOTE — Op Note (Signed)
NAME:  [name] [name], [name] [name]             ACCOUNT NO.:  [id number]   MEDICAL RECORD NO.:  [id number]          PATIENT TYPE:  AMB   LOCATION:  [facility]                          FACILITY:  [facility]   PHYSICIAN:  Harvie Junior, M.D.   DATE OF BIRTH:  [birth year]   DATE OF PROCEDURE:  05/04/2007  DATE OF DISCHARGE:                               OPERATIVE REPORT   PREOPERATIVE DIAGNOSIS:  1. Medial meniscal tear.  2. Chondromalacia patella.  3. 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   POSTOPERATIVE DIAGNOSIS:  1. Medial meniscal tear.  2. Chondromalacia patella.  3. 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  4,  Chondral injury, lateral side.   PROCEDURE:  1. Partial posterior horn medial meniscectomy with corresponding      debridement in the medial femoral compartment.  2. Chondroplasty of the patellofemoral compartment, in particular the      lateral patellar facet and the patellofemoral trochlea with grade 4      change with debridement down to bleeding bone.  3. Debridement of chondral injury in the lateral compartment, both of      the lateral tibial plateau and lateral femoral condyle.  4. Lateral retinacular release.   SURGEON:  Harvie Junior, MD   ASSISTANT:  None.   ANESTHESIA:  General.   HISTORY:  [name] [name] is a 65 year old female with long history of  having had significant pain in the right knee.  We treated her  conservative for a period of time because of continued complaints of  pain.  She ultimately underwent operative knee arthroscopy.  Suspected  to have chondromalacia of the patella, and a tight lateral retinaculum  with medial pain preoperatively.   PROCEDURE IN DETAIL:  The patient taken to the operating room.  After  adequate anesthesia was obtained with general endotracheal, the patient  was taken to the operating room where the right leg was prepped and  draped in the usual sterile fashion.  Following this routine  arthroscopic examination of the knee revealed that  there was an obvious  chondromalacia of the patella on the lateral patellar facet and also in  the patellofemoral trochlea with exposed bone centrally.  This is  debrided of all loose fragmenting pieces of cartilage.  Attention was  turned down to the medial compartment.  There was a complex tear of the  posterior horn of the medial meniscus, which was debrided with straight  biting forceps and upbiting forceps.  Suction shaver was used to contour  the remaining rim.  The medial femoral condyle had some grade 2 and  grade 3 change which was debrided in the medial compartment.   Attention was turned over laterally, where there was some grade 2 and 3  change in the lateral compartment.  The lat4eral meniscus was within  normal limits.  ACL was within normal limits.   Attention turned up to the lateral retinaculum, and a lateral  retinaculum certainly seemed tight, and given the chondral findings on  the patellofemoral joint, felt that lateral retinacular release was  appropriate, and this was performed arthroscopically from 3  fingerbreadths proximal to the patella, down  to the joint line.  Once  this was completed, the knee was copiously and thoroughly irrigated and  suctioned dry.  The patellofemoral joint now tracked midline and without  any significant pressure.   At this point the knee was thoroughly irrigated and suctioned dry.  The  arthroscopic portals were closed with a bandage.  Sterile, compressive  dressing was applied.  The patient was taken to the recovery room, noted  to be in satisfactory condition.  Estimated blood loss for the procedure was none.      Harvie Junior, M.D.  Electronically Signed     JLG/MEDQ  D:  05/04/2007  T:  05/04/2007  Job:  [id number]

## 2010-08-27 NOTE — Assessment & Plan Note (Signed)
[facility]                            CARDIOLOGY OFFICE NOTE   NAME:[name] [name], RASHELL SHAMBAUGH                    MRN:          [id number]  DATE:03/27/2006                            DOB:          December 21, 1945    PRIMARY CARE PHYSICIAN:  Lovenia Kim, D.O.   REASON FOR VISIT:  Routine follow up.   HISTORY OF PRESENT ILLNESS:  This is a my meeting with Ms. Ebright.  She is a former patient of Dr. Andee Lineman with hypertension, hyperlipidemia  and left bundle branch block pattern by electrocardiography.  She has  had previous reassuring ischemic assessment and has normal left  ventricular systolic function.  Symptomatically, she denies any problems  with significant palpitations, chest pain or limiting dyspnea since her  last visit.  She was doing some swimming back in the summer months and  lost 10 pounds but has gotten away from this.  Her electrocardiogram  today shows sinus rhythm with a left bundle branch block pattern and her  medications are listed below.   ALLERGIES:  1. MORPHINE.  2. MINOCYCLINE.   PRESENT MEDICATIONS:  1. Paxil CR 25 mg p.o. daily.  2. Etodolac 400 mg p.o. b.i.d.  3. Doxazosin 4 mg p.o. daily.  4. Premarin 1.25 mg p.o. daily.  5. Flaxseed oil 3 tablets p.o. daily.  6. Ampicillin 500 mg p.o. daily.  7. Ranitidine 150 mg p.o. daily.  8. Hydrochlorothiazide 25 mg p.o. daily.  9. Verapamil 180 mg p.o. daily.   REVIEW OF SYSTEMS:  As in present illness.   EXAMINATION:  Blood pressure today 150/80, heart rate is 69 and weight  is 214 pounds which is up from 200 pounds this past January.  The  patient is comfortable and in no acute distress.  Examination of the  neck reveals no elevated jugular venous pressure without bruits.  No  thyromegaly is noted.  Lungs are clear without labored breathing at  rest.  Cardiac exam reveals a regular rate and rhythm without loud  murmur or gallop.  Abdomen is soft, nontender, and nondistended.   Normal  active bowel sounds.  Extremities show no significant pitting edema.  Distal pulses are 2+.   IMPRESSION/RECOMMENDATIONS:  1. Left bundle branch block pattern by electrocardiogram with no clear      evidence of ischemia based on fairly recent Myoview and overall      normal left ventricular systolic function.  She is not bothered by      any significant palpitations recently and I have recommended      continued efforts at exercise and also follow up of hypertension      for more optimal control.  I did not make any specific medication      adjustments.  She will follow up with Dr. Elisabeth Most and we can plan      to see her back in one years' time.  2. Further plans to follow.     Jonelle Sidle, MD  Electronically Signed    SGM/MedQ  DD: 03/27/2006  DT: 03/27/2006  Job #: [id number]   cc:   Amy  Elmer Ramp, D.O.

## 2010-08-27 NOTE — Op Note (Signed)
NAME:  [name] [name], [name] [name]             ACCOUNT NO.:  [id number]   MEDICAL RECORD NO.:  [id number]          PATIENT TYPE:  AMB   LOCATION:  DSC                          FACILITY:  [facility]   PHYSICIAN:  Katy Fitch. Sypher, M.D. DATE OF BIRTH:  11-04-1945   DATE OF PROCEDURE:  10/20/2005  DATE OF DISCHARGE:                                 OPERATIVE REPORT   PREOPERATIVE DIAGNOSIS:  Extensive Dupuytren's contracture involving right  ring and small fingers extending from pretendinous fibers of the palm to the  middle phalangeal segment, with 60-degree flexion fracture of  metacarpophalangeal joints and 40-degree flexion fracture of proximal  interphalangeal joints.   POSTOPERATIVE DIAGNOSIS:  Extensive Dupuytren's contracture involving right  ring and small fingers extending from pretendinous fibers of the palm to the  middle phalangeal segment, with 60-degree flexion fracture of  metacarpophalangeal joints and 40-degree flexion fracture of proximal  interphalangeal joints.   OPERATIONS:  Resection of pathologic Dupuytren's palmar fibromatosis from  right ring and small fingers and palm.   OPERATING SURGEON:  Katy Fitch. Sypher, M.D.   ASSISTANT:  Molly Maduro Dasnoit PA-C.   ANESTHESIA:  General by LMA, supervising anesthesiologist is Dr. Gelene Mink.   INDICATIONS:  [name] [name] is a 65 year old right-hand dominant woman  referred for evaluation and management of an extensive palmar  fibromatosis/Dupuytren's contracture.   She has [place] ancestors.  She is unaware of any of other primary  relatives who have experienced Dupuytren's disease.   She developed a significant flexion contracture of her right ring and small  finger MP and PIP joints, rendering her hand function impaired.   She requested resection.  Preoperatively she was advised of the potential  for Dupuytren's disease to extend over time into other rays an recur in the  ray operated upon.   She understands that at times  skin grafts are required and other measures to  resolve recurrent palmar fibromatosis.   She also understands the basic risks of infection, anesthetic complication,  possible neurovascular injury during surgery.  After informed consent, she  is brought to the operating room at this time anticipating resection of her  pathologic palmar fibromatosis.   PROCEDURE:  [name] [name] was brought to the operating room and placed in  supine position on the operating table.   Following induction of general anesthesia by LMA technique, the right arm  was prepped with Betadine soap and solution and sterilely draped.  A  pneumatic tourniquet was applied to the proximal right brachium.   Following exsanguination of the left arm with an Esmarch bandage, an  arterial tourniquet on the proximal brachium was inflated to 230 mmHg.  Ancef 1 g was administered preoperatively as a prophylactic antibiotic.   Brunner zigzag incisions were planned beginning with an incision paralleling  the thenar crease in the palm.  The subcutaneous tissues were carefully  divided raising 90-degree flaps to the level of the PIP flexion creases of  the ring and small fingers.  The dermis was carefully separated from the  pathologic fascia, followed by meticulous resection of the pretendinous  fibers to the ring and small fingers, several  extensions to the natatory  ligaments between the long and ring and ring and small fingers followed by  removal of spiral bands adjacent to the neurovascular bundles and a central  cord distally.  The pathologic fascia was excised from the ring and small  fingers as well as the palm.  The pretendinous fibers to the index and long  finger and the thumb index webspace were not involved.   After excision of the pathologic fascia, full extension of the MP joints  with hyperextension at 30 degrees was recovered and full extension of the  PIP joints was recovered.   Bleeding points were  electrocauterized with bipolar current, followed by  irrigation of the wound.  The wound was then repaired with corner sutures of  5-0 nylon and multiple horizontal mattress sutures of 5-0 nylon.  Redundant  skin was resected prior to closure to prevent dead space.  The transverse  limb in the palm of the Brunner incisions was left open to close by  secondary intention.   The wounds were dressed with Silvadene, Adaptic, sterile gauze, sterile  Webril and a volar plaster splint maintaining the ring and small fingers in  full extension at the MP and PIP joints.  The tourniquet was released with  immediate capillary refill to all fingers.  The dressing was completed with  Ace wrap and a volar plaster splint.  There were no apparent complications.   [name] [name] tolerated surgery and anesthesia well.  She was transferred to  the recovery room with stable vital signs.  She will be discharged to care  of her family with prescriptions for Percocet 5 mg one or two tablets p.o.  q.4-6h. p.r.n. pain, 30 tablets without refill, also Keflex 500 mg one p.o.  q.8h. x4 days as a prophylactic antibiotic.      Katy Fitch Sypher, M.D.  Electronically Signed     RVS/MEDQ  D:  10/20/2005  T:  10/20/2005  Job:  [id number]

## 2010-12-30 LAB — I-STAT 8, (EC8 V) (CONVERTED LAB)
Bicarbonate: 28.3 — ABNORMAL HIGH
Glucose, Bld: 92
Hemoglobin: 11.6 — ABNORMAL LOW
Operator id: 279391
Sodium: 136
TCO2: 29

## 2011-02-23 ENCOUNTER — Other Ambulatory Visit: Payer: Self-pay | Admitting: Emergency Medicine

## 2011-02-23 DIAGNOSIS — Z1231 Encounter for screening mammogram for malignant neoplasm of breast: Secondary | ICD-10-CM

## 2011-04-01 ENCOUNTER — Ambulatory Visit: Payer: Self-pay

## 2011-04-22 ENCOUNTER — Ambulatory Visit: Payer: Self-pay

## 2011-05-10 DIAGNOSIS — M1710 Unilateral primary osteoarthritis, unspecified knee: Secondary | ICD-10-CM | POA: Diagnosis not present

## 2011-05-10 DIAGNOSIS — M7090 Unspecified soft tissue disorder related to use, overuse and pressure of unspecified site: Secondary | ICD-10-CM | POA: Diagnosis not present

## 2011-05-23 DIAGNOSIS — E559 Vitamin D deficiency, unspecified: Secondary | ICD-10-CM | POA: Diagnosis not present

## 2011-05-23 DIAGNOSIS — I10 Essential (primary) hypertension: Secondary | ICD-10-CM | POA: Diagnosis not present

## 2011-05-23 DIAGNOSIS — E782 Mixed hyperlipidemia: Secondary | ICD-10-CM | POA: Diagnosis not present

## 2011-05-23 DIAGNOSIS — Z79899 Other long term (current) drug therapy: Secondary | ICD-10-CM | POA: Diagnosis not present

## 2011-05-23 DIAGNOSIS — R7309 Other abnormal glucose: Secondary | ICD-10-CM | POA: Diagnosis not present

## 2011-05-23 DIAGNOSIS — Z1212 Encounter for screening for malignant neoplasm of rectum: Secondary | ICD-10-CM | POA: Diagnosis not present

## 2011-05-26 ENCOUNTER — Other Ambulatory Visit: Payer: Self-pay | Admitting: Internal Medicine

## 2011-05-26 DIAGNOSIS — N63 Unspecified lump in unspecified breast: Secondary | ICD-10-CM

## 2011-06-01 ENCOUNTER — Ambulatory Visit
Admission: RE | Admit: 2011-06-01 | Discharge: 2011-06-01 | Disposition: A | Discharge status: Home / Self Care | Payer: Medicare Other | Source: Ambulatory Visit | Attending: Internal Medicine | Admitting: Internal Medicine

## 2011-06-01 DIAGNOSIS — N63 Unspecified lump in unspecified breast: Secondary | ICD-10-CM

## 2011-06-01 DIAGNOSIS — N644 Mastodynia: Secondary | ICD-10-CM | POA: Diagnosis not present

## 2011-06-01 DIAGNOSIS — N6489 Other specified disorders of breast: Secondary | ICD-10-CM | POA: Diagnosis not present

## 2011-06-27 DIAGNOSIS — IMO0001 Reserved for inherently not codable concepts without codable children: Secondary | ICD-10-CM | POA: Diagnosis not present

## 2011-06-27 DIAGNOSIS — M999 Biomechanical lesion, unspecified: Secondary | ICD-10-CM | POA: Diagnosis not present

## 2011-06-27 DIAGNOSIS — M47814 Spondylosis without myelopathy or radiculopathy, thoracic region: Secondary | ICD-10-CM | POA: Diagnosis not present

## 2011-06-27 DIAGNOSIS — M9981 Other biomechanical lesions of cervical region: Secondary | ICD-10-CM | POA: Diagnosis not present

## 2011-06-27 DIAGNOSIS — IMO0002 Reserved for concepts with insufficient information to code with codable children: Secondary | ICD-10-CM | POA: Diagnosis not present

## 2011-07-13 DIAGNOSIS — IMO0001 Reserved for inherently not codable concepts without codable children: Secondary | ICD-10-CM | POA: Diagnosis not present

## 2011-07-13 DIAGNOSIS — M999 Biomechanical lesion, unspecified: Secondary | ICD-10-CM | POA: Diagnosis not present

## 2011-07-13 DIAGNOSIS — M9981 Other biomechanical lesions of cervical region: Secondary | ICD-10-CM | POA: Diagnosis not present

## 2011-07-13 DIAGNOSIS — IMO0002 Reserved for concepts with insufficient information to code with codable children: Secondary | ICD-10-CM | POA: Diagnosis not present

## 2011-07-13 DIAGNOSIS — M47814 Spondylosis without myelopathy or radiculopathy, thoracic region: Secondary | ICD-10-CM | POA: Diagnosis not present

## 2011-07-15 DIAGNOSIS — M999 Biomechanical lesion, unspecified: Secondary | ICD-10-CM | POA: Diagnosis not present

## 2011-07-15 DIAGNOSIS — M47814 Spondylosis without myelopathy or radiculopathy, thoracic region: Secondary | ICD-10-CM | POA: Diagnosis not present

## 2011-07-15 DIAGNOSIS — IMO0001 Reserved for inherently not codable concepts without codable children: Secondary | ICD-10-CM | POA: Diagnosis not present

## 2011-07-15 DIAGNOSIS — M9981 Other biomechanical lesions of cervical region: Secondary | ICD-10-CM | POA: Diagnosis not present

## 2011-07-15 DIAGNOSIS — IMO0002 Reserved for concepts with insufficient information to code with codable children: Secondary | ICD-10-CM | POA: Diagnosis not present

## 2011-07-22 DIAGNOSIS — M25569 Pain in unspecified knee: Secondary | ICD-10-CM | POA: Diagnosis not present

## 2011-08-12 DIAGNOSIS — M9981 Other biomechanical lesions of cervical region: Secondary | ICD-10-CM | POA: Diagnosis not present

## 2011-08-12 DIAGNOSIS — M999 Biomechanical lesion, unspecified: Secondary | ICD-10-CM | POA: Diagnosis not present

## 2011-08-12 DIAGNOSIS — M47814 Spondylosis without myelopathy or radiculopathy, thoracic region: Secondary | ICD-10-CM | POA: Diagnosis not present

## 2011-08-12 DIAGNOSIS — IMO0001 Reserved for inherently not codable concepts without codable children: Secondary | ICD-10-CM | POA: Diagnosis not present

## 2011-08-22 DIAGNOSIS — R7309 Other abnormal glucose: Secondary | ICD-10-CM | POA: Diagnosis not present

## 2011-08-22 DIAGNOSIS — E782 Mixed hyperlipidemia: Secondary | ICD-10-CM | POA: Diagnosis not present

## 2011-08-22 DIAGNOSIS — E559 Vitamin D deficiency, unspecified: Secondary | ICD-10-CM | POA: Diagnosis not present

## 2011-08-22 DIAGNOSIS — I10 Essential (primary) hypertension: Secondary | ICD-10-CM | POA: Diagnosis not present

## 2011-08-22 DIAGNOSIS — Z79899 Other long term (current) drug therapy: Secondary | ICD-10-CM | POA: Diagnosis not present

## 2011-09-08 DIAGNOSIS — M25569 Pain in unspecified knee: Secondary | ICD-10-CM | POA: Diagnosis not present

## 2011-09-09 DIAGNOSIS — M79609 Pain in unspecified limb: Secondary | ICD-10-CM | POA: Diagnosis not present

## 2011-09-13 DIAGNOSIS — IMO0001 Reserved for inherently not codable concepts without codable children: Secondary | ICD-10-CM | POA: Diagnosis not present

## 2011-09-13 DIAGNOSIS — M9981 Other biomechanical lesions of cervical region: Secondary | ICD-10-CM | POA: Diagnosis not present

## 2011-09-13 DIAGNOSIS — M47814 Spondylosis without myelopathy or radiculopathy, thoracic region: Secondary | ICD-10-CM | POA: Diagnosis not present

## 2011-09-13 DIAGNOSIS — M999 Biomechanical lesion, unspecified: Secondary | ICD-10-CM | POA: Diagnosis not present

## 2011-09-14 DIAGNOSIS — M79609 Pain in unspecified limb: Secondary | ICD-10-CM | POA: Diagnosis not present

## 2011-09-20 DIAGNOSIS — M47814 Spondylosis without myelopathy or radiculopathy, thoracic region: Secondary | ICD-10-CM | POA: Diagnosis not present

## 2011-09-20 DIAGNOSIS — M999 Biomechanical lesion, unspecified: Secondary | ICD-10-CM | POA: Diagnosis not present

## 2011-09-20 DIAGNOSIS — M25569 Pain in unspecified knee: Secondary | ICD-10-CM | POA: Diagnosis not present

## 2011-09-20 DIAGNOSIS — M9981 Other biomechanical lesions of cervical region: Secondary | ICD-10-CM | POA: Diagnosis not present

## 2011-09-28 DIAGNOSIS — M23302 Other meniscus derangements, unspecified lateral meniscus, unspecified knee: Secondary | ICD-10-CM | POA: Diagnosis not present

## 2011-09-28 DIAGNOSIS — M9420 Chondromalacia, unspecified site: Secondary | ICD-10-CM | POA: Diagnosis not present

## 2011-09-28 DIAGNOSIS — M23329 Other meniscus derangements, posterior horn of medial meniscus, unspecified knee: Secondary | ICD-10-CM | POA: Diagnosis not present

## 2011-09-28 DIAGNOSIS — G5790 Unspecified mononeuropathy of unspecified lower limb: Secondary | ICD-10-CM | POA: Diagnosis not present

## 2011-09-28 DIAGNOSIS — M2240 Chondromalacia patellae, unspecified knee: Secondary | ICD-10-CM | POA: Diagnosis not present

## 2011-09-28 DIAGNOSIS — S8410XA Injury of peroneal nerve at lower leg level, unspecified leg, initial encounter: Secondary | ICD-10-CM | POA: Diagnosis not present

## 2011-09-28 DIAGNOSIS — IMO0002 Reserved for concepts with insufficient information to code with codable children: Secondary | ICD-10-CM | POA: Diagnosis not present

## 2011-09-28 DIAGNOSIS — S83289A Other tear of lateral meniscus, current injury, unspecified knee, initial encounter: Secondary | ICD-10-CM | POA: Diagnosis not present

## 2011-10-26 DIAGNOSIS — M9981 Other biomechanical lesions of cervical region: Secondary | ICD-10-CM | POA: Diagnosis not present

## 2011-10-26 DIAGNOSIS — M999 Biomechanical lesion, unspecified: Secondary | ICD-10-CM | POA: Diagnosis not present

## 2011-10-26 DIAGNOSIS — M47814 Spondylosis without myelopathy or radiculopathy, thoracic region: Secondary | ICD-10-CM | POA: Diagnosis not present

## 2011-11-03 DIAGNOSIS — M25569 Pain in unspecified knee: Secondary | ICD-10-CM | POA: Diagnosis not present

## 2011-11-03 DIAGNOSIS — M2240 Chondromalacia patellae, unspecified knee: Secondary | ICD-10-CM | POA: Diagnosis not present

## 2011-11-22 DIAGNOSIS — E782 Mixed hyperlipidemia: Secondary | ICD-10-CM | POA: Diagnosis not present

## 2011-11-22 DIAGNOSIS — E559 Vitamin D deficiency, unspecified: Secondary | ICD-10-CM | POA: Diagnosis not present

## 2011-11-22 DIAGNOSIS — R7309 Other abnormal glucose: Secondary | ICD-10-CM | POA: Diagnosis not present

## 2011-11-22 DIAGNOSIS — I10 Essential (primary) hypertension: Secondary | ICD-10-CM | POA: Diagnosis not present

## 2011-11-22 DIAGNOSIS — E538 Deficiency of other specified B group vitamins: Secondary | ICD-10-CM | POA: Diagnosis not present

## 2011-11-22 DIAGNOSIS — D649 Anemia, unspecified: Secondary | ICD-10-CM | POA: Diagnosis not present

## 2011-12-01 DIAGNOSIS — M2240 Chondromalacia patellae, unspecified knee: Secondary | ICD-10-CM | POA: Diagnosis not present

## 2011-12-01 DIAGNOSIS — M1710 Unilateral primary osteoarthritis, unspecified knee: Secondary | ICD-10-CM | POA: Diagnosis not present

## 2011-12-08 DIAGNOSIS — M1710 Unilateral primary osteoarthritis, unspecified knee: Secondary | ICD-10-CM | POA: Diagnosis not present

## 2011-12-08 DIAGNOSIS — M25569 Pain in unspecified knee: Secondary | ICD-10-CM | POA: Diagnosis not present

## 2011-12-15 DIAGNOSIS — M1710 Unilateral primary osteoarthritis, unspecified knee: Secondary | ICD-10-CM | POA: Diagnosis not present

## 2011-12-22 DIAGNOSIS — M1710 Unilateral primary osteoarthritis, unspecified knee: Secondary | ICD-10-CM | POA: Diagnosis not present

## 2011-12-29 DIAGNOSIS — M1710 Unilateral primary osteoarthritis, unspecified knee: Secondary | ICD-10-CM | POA: Diagnosis not present

## 2012-01-24 DIAGNOSIS — R29898 Other symptoms and signs involving the musculoskeletal system: Secondary | ICD-10-CM | POA: Diagnosis not present

## 2012-01-24 DIAGNOSIS — M1710 Unilateral primary osteoarthritis, unspecified knee: Secondary | ICD-10-CM | POA: Diagnosis not present

## 2012-02-01 DIAGNOSIS — Z23 Encounter for immunization: Secondary | ICD-10-CM | POA: Diagnosis not present

## 2012-02-07 DIAGNOSIS — IMO0002 Reserved for concepts with insufficient information to code with codable children: Secondary | ICD-10-CM | POA: Diagnosis not present

## 2012-02-07 DIAGNOSIS — M545 Low back pain: Secondary | ICD-10-CM | POA: Diagnosis not present

## 2012-02-07 DIAGNOSIS — M1710 Unilateral primary osteoarthritis, unspecified knee: Secondary | ICD-10-CM | POA: Diagnosis not present

## 2012-03-12 DIAGNOSIS — M25569 Pain in unspecified knee: Secondary | ICD-10-CM | POA: Diagnosis not present

## 2012-03-19 DIAGNOSIS — R109 Unspecified abdominal pain: Secondary | ICD-10-CM | POA: Diagnosis not present

## 2012-03-19 DIAGNOSIS — E782 Mixed hyperlipidemia: Secondary | ICD-10-CM | POA: Diagnosis not present

## 2012-03-19 DIAGNOSIS — E559 Vitamin D deficiency, unspecified: Secondary | ICD-10-CM | POA: Diagnosis not present

## 2012-03-19 DIAGNOSIS — R7309 Other abnormal glucose: Secondary | ICD-10-CM | POA: Diagnosis not present

## 2012-03-19 DIAGNOSIS — Z79899 Other long term (current) drug therapy: Secondary | ICD-10-CM | POA: Diagnosis not present

## 2012-03-19 DIAGNOSIS — I10 Essential (primary) hypertension: Secondary | ICD-10-CM | POA: Diagnosis not present

## 2012-04-11 HISTORY — PX: COLONOSCOPY: SHX174

## 2012-06-04 DIAGNOSIS — I10 Essential (primary) hypertension: Secondary | ICD-10-CM | POA: Diagnosis not present

## 2012-06-04 DIAGNOSIS — E782 Mixed hyperlipidemia: Secondary | ICD-10-CM | POA: Diagnosis not present

## 2012-06-04 DIAGNOSIS — D539 Nutritional anemia, unspecified: Secondary | ICD-10-CM | POA: Diagnosis not present

## 2012-06-04 DIAGNOSIS — R7309 Other abnormal glucose: Secondary | ICD-10-CM | POA: Diagnosis not present

## 2012-06-04 DIAGNOSIS — E559 Vitamin D deficiency, unspecified: Secondary | ICD-10-CM | POA: Diagnosis not present

## 2012-06-04 DIAGNOSIS — D518 Other vitamin B12 deficiency anemias: Secondary | ICD-10-CM | POA: Diagnosis not present

## 2012-06-04 DIAGNOSIS — Z79899 Other long term (current) drug therapy: Secondary | ICD-10-CM | POA: Diagnosis not present

## 2012-06-08 ENCOUNTER — Other Ambulatory Visit: Payer: Self-pay

## 2012-06-08 DIAGNOSIS — Z1231 Encounter for screening mammogram for malignant neoplasm of breast: Secondary | ICD-10-CM

## 2012-06-13 ENCOUNTER — Other Ambulatory Visit: Payer: Self-pay

## 2012-06-13 DIAGNOSIS — D485 Neoplasm of uncertain behavior of skin: Secondary | ICD-10-CM | POA: Diagnosis not present

## 2012-06-13 DIAGNOSIS — E782 Mixed hyperlipidemia: Secondary | ICD-10-CM | POA: Diagnosis not present

## 2012-07-06 ENCOUNTER — Inpatient Hospital Stay: Admission: RE | Admit: 2012-07-06 | Payer: Medicare Other | Source: Ambulatory Visit

## 2012-08-30 DIAGNOSIS — R7309 Other abnormal glucose: Secondary | ICD-10-CM | POA: Diagnosis not present

## 2012-08-30 DIAGNOSIS — I10 Essential (primary) hypertension: Secondary | ICD-10-CM | POA: Diagnosis not present

## 2012-08-30 DIAGNOSIS — E559 Vitamin D deficiency, unspecified: Secondary | ICD-10-CM | POA: Diagnosis not present

## 2012-08-30 DIAGNOSIS — E782 Mixed hyperlipidemia: Secondary | ICD-10-CM | POA: Diagnosis not present

## 2012-08-30 DIAGNOSIS — Z79899 Other long term (current) drug therapy: Secondary | ICD-10-CM | POA: Diagnosis not present

## 2012-09-06 DIAGNOSIS — M9981 Other biomechanical lesions of cervical region: Secondary | ICD-10-CM | POA: Diagnosis not present

## 2012-09-06 DIAGNOSIS — M999 Biomechanical lesion, unspecified: Secondary | ICD-10-CM | POA: Diagnosis not present

## 2012-09-06 DIAGNOSIS — M47814 Spondylosis without myelopathy or radiculopathy, thoracic region: Secondary | ICD-10-CM | POA: Diagnosis not present

## 2012-09-06 DIAGNOSIS — IMO0001 Reserved for inherently not codable concepts without codable children: Secondary | ICD-10-CM | POA: Diagnosis not present

## 2012-09-10 DIAGNOSIS — M9981 Other biomechanical lesions of cervical region: Secondary | ICD-10-CM | POA: Diagnosis not present

## 2012-09-10 DIAGNOSIS — IMO0001 Reserved for inherently not codable concepts without codable children: Secondary | ICD-10-CM | POA: Diagnosis not present

## 2012-09-10 DIAGNOSIS — M999 Biomechanical lesion, unspecified: Secondary | ICD-10-CM | POA: Diagnosis not present

## 2012-09-10 DIAGNOSIS — M47814 Spondylosis without myelopathy or radiculopathy, thoracic region: Secondary | ICD-10-CM | POA: Diagnosis not present

## 2012-09-12 DIAGNOSIS — M999 Biomechanical lesion, unspecified: Secondary | ICD-10-CM | POA: Diagnosis not present

## 2012-09-12 DIAGNOSIS — M47814 Spondylosis without myelopathy or radiculopathy, thoracic region: Secondary | ICD-10-CM | POA: Diagnosis not present

## 2012-09-12 DIAGNOSIS — M9981 Other biomechanical lesions of cervical region: Secondary | ICD-10-CM | POA: Diagnosis not present

## 2012-09-12 DIAGNOSIS — IMO0001 Reserved for inherently not codable concepts without codable children: Secondary | ICD-10-CM | POA: Diagnosis not present

## 2012-09-21 DIAGNOSIS — M999 Biomechanical lesion, unspecified: Secondary | ICD-10-CM | POA: Diagnosis not present

## 2012-09-21 DIAGNOSIS — IMO0001 Reserved for inherently not codable concepts without codable children: Secondary | ICD-10-CM | POA: Diagnosis not present

## 2012-09-21 DIAGNOSIS — M9981 Other biomechanical lesions of cervical region: Secondary | ICD-10-CM | POA: Diagnosis not present

## 2012-09-21 DIAGNOSIS — M47814 Spondylosis without myelopathy or radiculopathy, thoracic region: Secondary | ICD-10-CM | POA: Diagnosis not present

## 2012-10-23 DIAGNOSIS — M1710 Unilateral primary osteoarthritis, unspecified knee: Secondary | ICD-10-CM | POA: Diagnosis not present

## 2012-10-23 DIAGNOSIS — M545 Low back pain: Secondary | ICD-10-CM | POA: Diagnosis not present

## 2012-10-23 DIAGNOSIS — M549 Dorsalgia, unspecified: Secondary | ICD-10-CM | POA: Diagnosis not present

## 2012-11-16 ENCOUNTER — Encounter: Payer: Self-pay | Admitting: Gastroenterology

## 2012-12-20 DIAGNOSIS — M25569 Pain in unspecified knee: Secondary | ICD-10-CM | POA: Diagnosis not present

## 2012-12-20 DIAGNOSIS — M1710 Unilateral primary osteoarthritis, unspecified knee: Secondary | ICD-10-CM | POA: Diagnosis not present

## 2012-12-27 DIAGNOSIS — M1710 Unilateral primary osteoarthritis, unspecified knee: Secondary | ICD-10-CM | POA: Diagnosis not present

## 2013-01-01 DIAGNOSIS — I10 Essential (primary) hypertension: Secondary | ICD-10-CM | POA: Diagnosis not present

## 2013-01-01 DIAGNOSIS — M79609 Pain in unspecified limb: Secondary | ICD-10-CM | POA: Diagnosis not present

## 2013-01-01 DIAGNOSIS — R7309 Other abnormal glucose: Secondary | ICD-10-CM | POA: Diagnosis not present

## 2013-01-01 DIAGNOSIS — E559 Vitamin D deficiency, unspecified: Secondary | ICD-10-CM | POA: Diagnosis not present

## 2013-01-01 DIAGNOSIS — Z23 Encounter for immunization: Secondary | ICD-10-CM | POA: Diagnosis not present

## 2013-01-01 DIAGNOSIS — E782 Mixed hyperlipidemia: Secondary | ICD-10-CM | POA: Diagnosis not present

## 2013-01-01 DIAGNOSIS — Z79899 Other long term (current) drug therapy: Secondary | ICD-10-CM | POA: Diagnosis not present

## 2013-01-03 DIAGNOSIS — M1710 Unilateral primary osteoarthritis, unspecified knee: Secondary | ICD-10-CM | POA: Diagnosis not present

## 2013-01-04 ENCOUNTER — Encounter: Payer: Self-pay | Admitting: Gastroenterology

## 2013-01-10 DIAGNOSIS — M1710 Unilateral primary osteoarthritis, unspecified knee: Secondary | ICD-10-CM | POA: Diagnosis not present

## 2013-01-17 DIAGNOSIS — M1710 Unilateral primary osteoarthritis, unspecified knee: Secondary | ICD-10-CM | POA: Diagnosis not present

## 2013-01-21 DIAGNOSIS — M549 Dorsalgia, unspecified: Secondary | ICD-10-CM | POA: Diagnosis not present

## 2013-01-21 DIAGNOSIS — J019 Acute sinusitis, unspecified: Secondary | ICD-10-CM | POA: Diagnosis not present

## 2013-02-11 ENCOUNTER — Other Ambulatory Visit: Payer: Self-pay | Admitting: Physician Assistant

## 2013-02-11 ENCOUNTER — Ambulatory Visit (HOSPITAL_COMMUNITY)
Admission: RE | Admit: 2013-02-11 | Discharge: 2013-02-11 | Disposition: A | Discharge status: Home / Self Care | Payer: Medicare Other | Source: Ambulatory Visit | Attending: Physician Assistant | Admitting: Physician Assistant

## 2013-02-11 DIAGNOSIS — S298XXA Other specified injuries of thorax, initial encounter: Secondary | ICD-10-CM | POA: Diagnosis not present

## 2013-02-11 DIAGNOSIS — R109 Unspecified abdominal pain: Secondary | ICD-10-CM

## 2013-02-11 DIAGNOSIS — R079 Chest pain, unspecified: Secondary | ICD-10-CM | POA: Diagnosis not present

## 2013-02-12 ENCOUNTER — Telehealth: Payer: Self-pay

## 2013-02-12 NOTE — Telephone Encounter (Signed)
Patient given xray results, advised to discontinue gabapentin and continue pain meds , call office if needed

## 2013-02-12 NOTE — Telephone Encounter (Signed)
Message copied by Linwood Dibbles on Tue Feb 12, 2013  5:06 PM ------      Message from: Quentin Mulling R      Created: Tue Feb 12, 2013  4:54 PM       Please call patient and inform her that CXR showed a non displaced rib fracture. She can decrease or stop the Gabapentin and call if she needs pain meds. ------

## 2013-02-13 ENCOUNTER — Telehealth: Payer: Self-pay

## 2013-02-13 ENCOUNTER — Telehealth: Payer: Self-pay | Admitting: Physician Assistant

## 2013-02-13 MED ORDER — OXYCODONE-ACETAMINOPHEN 5-325 MG PO TABS: 1.0000 | ORAL_TABLET | Freq: Four times a day (QID) | ORAL | Status: DC | PRN

## 2013-02-13 NOTE — Telephone Encounter (Signed)
Patient has rib fracture. Will call in stronger pain medication. Please discontinue Norco. She can also buy an ACE bandage and wrap her ribs at the area that hurts to help decrease pain.

## 2013-02-13 NOTE — Telephone Encounter (Signed)
Pt got a call from [place] yesterday sayin that she had a fracture rib=and she is havin a lot of pain=she wants to know can we or her tape up her rib area =would that help with the pain? sendin chart back .

## 2013-02-13 NOTE — Telephone Encounter (Signed)
Patient has been informed of her xray results and advised to d/c gabapentin

## 2013-02-13 NOTE — Telephone Encounter (Signed)
Message copied by Linwood Dibbles on Wed Feb 13, 2013  8:08 AM ------      Message from: Quentin Mulling R      Created: Tue Feb 12, 2013  4:54 PM       Please call patient and inform her that CXR showed a non displaced rib fracture. She can decrease or stop the Gabapentin and call if she needs pain meds. ------

## 2013-02-26 ENCOUNTER — Ambulatory Visit (AMBULATORY_SURGERY_CENTER): Payer: Medicare Other | Admitting: *Deleted

## 2013-02-26 VITALS — Ht 65.0 in | Wt 223.8 lb

## 2013-02-26 DIAGNOSIS — Z1211 Encounter for screening for malignant neoplasm of colon: Secondary | ICD-10-CM

## 2013-02-26 MED ORDER — NA SULFATE-K SULFATE-MG SULF 17.5-3.13-1.6 GM/177ML PO SOLN: 1.0000 | Freq: Once | ORAL | Status: DC

## 2013-02-26 NOTE — Progress Notes (Signed)
Denies allergies to eggs or soy products. Denies complications with sedation or anesthesia. 

## 2013-03-18 ENCOUNTER — Other Ambulatory Visit: Payer: Self-pay | Admitting: Physician Assistant

## 2013-03-18 MED ORDER — GABAPENTIN 800 MG PO TABS: ORAL_TABLET | ORAL | Status: DC

## 2013-03-19 ENCOUNTER — Encounter: Payer: Self-pay | Admitting: Gastroenterology

## 2013-03-19 ENCOUNTER — Ambulatory Visit (AMBULATORY_SURGERY_CENTER): Payer: Medicare Other | Admitting: Gastroenterology

## 2013-03-19 VITALS — BP 151/83 | HR 72 | Temp 97.0°F | Resp 32 | Ht 65.0 in | Wt 223.0 lb

## 2013-03-19 DIAGNOSIS — K5730 Diverticulosis of large intestine without perforation or abscess without bleeding: Secondary | ICD-10-CM | POA: Diagnosis not present

## 2013-03-19 DIAGNOSIS — K5732 Diverticulitis of large intestine without perforation or abscess without bleeding: Secondary | ICD-10-CM | POA: Diagnosis not present

## 2013-03-19 DIAGNOSIS — D649 Anemia, unspecified: Secondary | ICD-10-CM | POA: Diagnosis not present

## 2013-03-19 DIAGNOSIS — K219 Gastro-esophageal reflux disease without esophagitis: Secondary | ICD-10-CM | POA: Diagnosis not present

## 2013-03-19 DIAGNOSIS — R109 Unspecified abdominal pain: Secondary | ICD-10-CM | POA: Diagnosis not present

## 2013-03-19 DIAGNOSIS — Z1211 Encounter for screening for malignant neoplasm of colon: Secondary | ICD-10-CM

## 2013-03-19 MED ORDER — SODIUM CHLORIDE 0.9 % IV SOLN: 500.0000 mL | INTRAVENOUS | Status: DC

## 2013-03-19 NOTE — Op Note (Signed)
[facility] [address] [place], [address]   COLONOSCOPY PROCEDURE REPORT  PATIENT: [name] [name], [name] [name]  MR#: [id number] BIRTHDATE: Aug 27, 1945 , 66  yrs. old GENDER: Female ENDOSCOPIST: Louis Meckel, MD REFERRED XB:[id number] Oneta Rack, M.D. PROCEDURE DATE:  03/19/2013 PROCEDURE:   Colonoscopy, diagnostic First Screening Colonoscopy - Avg.  risk and is 50 yrs.  old or older - No.  Prior Negative Screening - Now for repeat screening. 10 or more years since last screening  History of Adenoma - Now for follow-up colonoscopy & has been > or = to 3 yrs.  N/A  Polyps Removed Today? No.  Recommend repeat exam, <10 yrs? No. ASA CLASS:   Class II INDICATIONS:Average risk patient for colon cancer. MEDICATIONS: MAC sedation, administered by CRNA and Fentanyl-Detailed 260 mg IV  DESCRIPTION OF PROCEDURE:   After the risks benefits and alternatives of the procedure were thoroughly explained, informed consent was obtained.  A digital rectal exam revealed no abnormalities of the rectum.   The LB [id number]  endoscope was introduced through the anus and advanced to the terminal ileum which was intubated for a short distance. No adverse events experienced.   The quality of the prep was Suprep good  The instrument was then slowly withdrawn as the colon was fully examined.      COLON FINDINGS: The mucosa appeared normal in the terminal ileum. A normal appearing cecum, ileocecal valve, and appendiceal orifice were identified.  The ascending, hepatic flexure, transverse, splenic flexure, descending, sigmoid colon and rectum appeared unremarkable.  No polyps or cancers were seen.   Moderate diverticulosis was noted in the sigmoid colon.   The colon was otherwise normal.  There was no diverticulosis, inflammation, polyps or cancers unless previously stated.  Retroflexed views revealed no abnormalities. The time to cecum=4 minutes 04 seconds. Withdrawal time=7 minutes 49  seconds.  The scope was withdrawn and the procedure completed. COMPLICATIONS: There were no complications.  ENDOSCOPIC IMPRESSION: 1.   Normal mucosa in the terminal ileum 2.   Moderate diverticulosis was noted in the sigmoid colon 3.   The colon was otherwise normal  RECOMMENDATIONS: Continue current colorectal screening recommendations for "routine risk" patients with a repeat colonoscopy in 10 years.   eSigned:  Louis Meckel, MD 03/19/2013 9:09 AM   cc:   PATIENT NAME:  [name] [name], [name] [name] MR#: [id number]

## 2013-03-19 NOTE — Progress Notes (Signed)
Patient did not experience any of the following events: a burn prior to discharge; a fall within the facility; wrong site/side/patient/procedure/implant event; or a hospital transfer or hospital admission upon discharge from the facility. (G8907) Patient did not have preoperative order for IV antibiotic SSI prophylaxis. (G8918)  

## 2013-03-19 NOTE — Patient Instructions (Signed)

## 2013-03-19 NOTE — Progress Notes (Signed)
Report to pacu rn, vss, bbs=clear 

## 2013-03-20 ENCOUNTER — Telehealth: Payer: Self-pay | Admitting: *Deleted

## 2013-03-20 NOTE — Telephone Encounter (Signed)
Number identifier, left message, follow-up  

## 2013-03-27 ENCOUNTER — Encounter: Payer: Self-pay | Admitting: Internal Medicine

## 2013-03-28 ENCOUNTER — Encounter (INDEPENDENT_AMBULATORY_CARE_PROVIDER_SITE_OTHER): Payer: Self-pay

## 2013-03-28 ENCOUNTER — Ambulatory Visit (INDEPENDENT_AMBULATORY_CARE_PROVIDER_SITE_OTHER): Payer: Medicare Other | Admitting: Emergency Medicine

## 2013-03-28 ENCOUNTER — Encounter: Payer: Self-pay | Admitting: Emergency Medicine

## 2013-03-28 VITALS — BP 130/68 | HR 82 | Temp 97.8°F | Resp 18 | Ht 65.5 in | Wt 218.0 lb

## 2013-03-28 DIAGNOSIS — I10 Essential (primary) hypertension: Secondary | ICD-10-CM | POA: Diagnosis not present

## 2013-03-28 DIAGNOSIS — R7309 Other abnormal glucose: Secondary | ICD-10-CM | POA: Diagnosis not present

## 2013-03-28 DIAGNOSIS — E559 Vitamin D deficiency, unspecified: Secondary | ICD-10-CM

## 2013-03-28 DIAGNOSIS — R0602 Shortness of breath: Secondary | ICD-10-CM

## 2013-03-28 DIAGNOSIS — E782 Mixed hyperlipidemia: Secondary | ICD-10-CM | POA: Diagnosis not present

## 2013-03-28 LAB — HEPATIC FUNCTION PANEL
ALT: 12 U/L (ref 0–35)
Alkaline Phosphatase: 115 U/L (ref 39–117)
Indirect Bilirubin: 0.2 mg/dL (ref 0.0–0.9)
Total Protein: 7.3 g/dL (ref 6.0–8.3)

## 2013-03-28 LAB — CBC WITH DIFFERENTIAL/PLATELET
Basophils Absolute: 0.1 10*3/uL (ref 0.0–0.1)
Basophils Relative: 1 % (ref 0–1)
Eosinophils Absolute: 0.3 10*3/uL (ref 0.0–0.7)
Eosinophils Relative: 3 % (ref 0–5)
MCH: 26.4 pg (ref 26.0–34.0)
MCHC: 32.8 g/dL (ref 30.0–36.0)
MCV: 80.5 fL (ref 78.0–100.0)
Monocytes Relative: 8 % (ref 3–12)
Neutro Abs: 5.9 10*3/uL (ref 1.7–7.7)
Neutrophils Relative %: 61 % (ref 43–77)
Platelets: 345 10*3/uL (ref 150–400)

## 2013-03-28 LAB — BASIC METABOLIC PANEL WITH GFR
CO2: 26 mEq/L (ref 19–32)
Calcium: 9.5 mg/dL (ref 8.4–10.5)
GFR, Est African American: 85 mL/min
GFR, Est Non African American: 74 mL/min
Potassium: 4.4 mEq/L (ref 3.5–5.3)
Sodium: 136 mEq/L (ref 135–145)

## 2013-03-28 LAB — HEMOGLOBIN A1C
Hgb A1c MFr Bld: 6 % — ABNORMAL HIGH (ref <5.7)
Mean Plasma Glucose: 126 mg/dL — ABNORMAL HIGH (ref <117)

## 2013-03-28 LAB — LIPID PANEL
HDL: 51 mg/dL (ref >39)
LDL Cholesterol: 119 mg/dL — ABNORMAL HIGH (ref 0–99)
Total CHOL/HDL Ratio: 3.9 Ratio
VLDL: 29 mg/dL (ref 0–40)

## 2013-03-28 NOTE — Progress Notes (Signed)
Subjective:    Patient ID: [name] [name], female    DOB: 11-05-1945, 67 y.o.   MRN: [id number]  HPI Comments: 67 yo female presents for 3 month F/U for HTN, Cholesterol, Pre-Dm, D. Deficient. She notes she is trying to improve exercise with walking the dog. She is eating descent. LAST LABS T 193 TG 170 H 47 LDL 112 A1C 6.3 D 73. Her BP. has been good at home.  She notes occasional SOB with exertion but notes usually with her left sided rib pain from previous FX. She is slowly trying to increase activity level, which had declined with the rib FX pain.  She notes mild increased stress. She has noticed sores have started occuring in head. She enies itch and is not sure if she picks or has a nervous tendency of scratching head.  Anemia  Gastrophageal Reflux  Anxiety Symptoms include nervous/anxious behavior and shortness of breath. Patient reports no suicidal ideas.   Her past medical history is significant for anemia.  Hypertension Associated symptoms include anxiety and shortness of breath.  Hyperlipidemia Associated symptoms include shortness of breath.   Current Outpatient Prescriptions on File Prior to Visit  Medication Sig Dispense Refill  . atorvastatin (LIPITOR) 80 MG tablet Take 80 mg by mouth daily. 1/2 TU TH SUN      . doxazosin (CARDURA) 4 MG tablet Take 4 mg by mouth daily. 1/2 qd      . DULoxetine (CYMBALTA) 30 MG capsule Take 30 mg by mouth 3 (three) times daily.       Marland Kitchen gabapentin (NEURONTIN) 800 MG tablet Take one tablet 3 times daily  90 tablet  0  . oxyCODONE-acetaminophen (ROXICET) 5-325 MG per tablet Take 1 tablet by mouth every 6 (six) hours as needed.  60 tablet  0  . temazepam (RESTORIL) 30 MG capsule Take 30 mg by mouth at bedtime as needed for sleep.      . traMADol (ULTRAM) 50 MG tablet Take 50 mg by mouth 3 (three) times daily.      . verapamil (CALAN-SR) 240 MG CR tablet Take 240 mg by mouth at bedtime.       No current facility-administered  medications on file prior to visit.   ALLERGIES Requip; Codeine; Minocycline; Morphine sulfate; Tetracyclines & related; Zestril; Zetia; Amoxicillin; and Sulfa antibiotics  Past Medical History  Diagnosis Date  . Hypertension   . Arthritis   . Cataracts, bilateral   . Depression   . Anxiety   . GERD (gastroesophageal reflux disease)   . Hyperlipidemia   . Rib fracture   . Shingles   . Anemia   . Allergy   . Pre-diabetes   . Obese   . Gallstones        Review of Systems  Respiratory: Positive for shortness of breath.   Skin: Positive for rash.  Psychiatric/Behavioral: Negative for suicidal ideas. The patient is nervous/anxious.   All other systems reviewed and are negative.   BP 130/68  Pulse 82  Temp(Src) 97.8 F (36.6 C) (Temporal)  Resp 18  Ht 5' 5.5" (1.664 m)  Wt 218 lb (98.884 kg)  BMI 35.71 kg/m2     Objective:   Physical Exam  Nursing note and vitals reviewed. Constitutional: She is oriented to person, place, and time. She appears well-developed and well-nourished. No distress.  HENT:  Head: Normocephalic and atraumatic.  Right Ear: External ear normal.  Left Ear: External ear normal.  Nose: Nose normal.  Mouth/Throat: Oropharynx is  clear and moist.  Eyes: Conjunctivae and EOM are normal.  Neck: Normal range of motion. Neck supple. No JVD present. No thyromegaly present.  Cardiovascular: Normal rate, regular rhythm, normal heart sounds and intact distal pulses.   Pulmonary/Chest: Effort normal and breath sounds normal.  Abdominal: Soft. Bowel sounds are normal. She exhibits no distension and no mass. There is no tenderness. There is no rebound and no guarding.  Musculoskeletal: Normal range of motion. She exhibits no edema and no tenderness.  Lymphadenopathy:    She has no cervical adenopathy.  Neurological: She is alert and oriented to person, place, and time. No cranial nerve deficit.  Skin: Skin is warm and dry. No rash noted. No erythema. No  pallor.  Scattered 2-3 mm scaling, no erythema/ exudate  Psychiatric: She has a normal mood and affect. Her behavior is normal. Judgment and thought content normal.          Assessment & Plan:  1.  3 month F/U for HTN, Cholesterol, Pre-Dm, D. Deficient. Needs healthy diet, cardio QD and obtain healthy weight. Check Labs, Check BP if >130/80 call office 2. SOB vs Rib FX hx vs Anxiety-CXR, Check labs if any increase SX ER, advised of decreased stress measures. Advised to uses Baby shampoo on scalp and try not to scratch as aobserved during visit. Patient w/c if sx increase.

## 2013-03-28 NOTE — Patient Instructions (Signed)
Rib Fracture  A rib fracture is a break or crack in one of the bones of the ribs. The ribs are like a cage that goes around your upper chest. A broken or cracked rib is often painful, but most do not cause other problems. Most rib fractures heal on their own in 1 3 months.  HOME CARE   Avoid activities that cause pain to the injured area. Protect your injured area.   Slowly increase activity as told by your doctor.   Take medicine as told by your doctor.   Put ice on the injured area for the first 1 2 days after you have been treated or as told by your doctor.   Put ice in a plastic bag.   Place a towel between your skin and the bag.   Leave the ice on for 15 20 minutes at a time, every 2 hours while you are awake.   Do deep breathing as told by your doctor. You may be told to:   Take deep breaths many times a day.   Cough many times a day while hugging a pillow.   Use a device (incentive spirometer) to perform deep breathing many times a day.   Drink enough fluids to keep your pee (urine) clear or pale yellow.    Do not wear a rib belt or binder. These do not allow you to breathe deeply.  GET HELP RIGHT AWAY IF:    You have a fever.   You have trouble breathing.    You cannot stop coughing.   You cough up thick or bloody spit (mucus).    You feel sick to your stomach (nauseous), throw up (vomit), or have belly (abdominal) pain.    Your pain gets worse and medicine does not help.   MAKE SURE YOU:    Understand these instructions.   Will watch your condition.   Will get help right away if you are not doing well or get worse.  Document Released: 01/05/2008 Document Revised: 07/23/2012 Document Reviewed: 05/30/2012  [employer] Patient Information 2014 [employer].

## 2013-03-29 ENCOUNTER — Ambulatory Visit (HOSPITAL_COMMUNITY)
Admission: RE | Admit: 2013-03-29 | Discharge: 2013-03-29 | Disposition: A | Discharge status: Home / Self Care | Payer: Medicare Other | Source: Ambulatory Visit | Attending: Emergency Medicine | Admitting: Emergency Medicine

## 2013-03-29 ENCOUNTER — Other Ambulatory Visit: Payer: Self-pay | Admitting: Physician Assistant

## 2013-03-29 ENCOUNTER — Telehealth: Payer: Self-pay | Admitting: *Deleted

## 2013-03-29 DIAGNOSIS — R079 Chest pain, unspecified: Secondary | ICD-10-CM | POA: Insufficient documentation

## 2013-03-29 DIAGNOSIS — R0602 Shortness of breath: Secondary | ICD-10-CM | POA: Diagnosis not present

## 2013-03-29 DIAGNOSIS — S298XXA Other specified injuries of thorax, initial encounter: Secondary | ICD-10-CM | POA: Diagnosis not present

## 2013-03-29 DIAGNOSIS — Z1231 Encounter for screening mammogram for malignant neoplasm of breast: Secondary | ICD-10-CM

## 2013-03-29 LAB — INSULIN, FASTING: Insulin fasting, serum: 20 u[IU]/mL (ref 3–28)

## 2013-03-29 NOTE — Telephone Encounter (Signed)
Message copied by Nicholaus Corolla A on Fri Mar 29, 2013 12:05 PM ------      Message from: [place], [place] R      Created: Fri Mar 29, 2013 11:54 AM       CXR WNL ------

## 2013-04-01 NOTE — Telephone Encounter (Signed)
Spoke with patient about recent lab results, Xray results and instructions.

## 2013-04-16 ENCOUNTER — Ambulatory Visit (HOSPITAL_COMMUNITY)
Admission: RE | Admit: 2013-04-16 | Discharge: 2013-04-16 | Disposition: A | Discharge status: Home / Self Care | Payer: Medicare Other | Source: Ambulatory Visit | Attending: Physician Assistant | Admitting: Physician Assistant

## 2013-04-16 DIAGNOSIS — Z1231 Encounter for screening mammogram for malignant neoplasm of breast: Secondary | ICD-10-CM | POA: Diagnosis not present

## 2013-04-17 ENCOUNTER — Other Ambulatory Visit: Payer: Self-pay | Admitting: Physician Assistant

## 2013-04-17 MED ORDER — GABAPENTIN 800 MG PO TABS: ORAL_TABLET | ORAL | Status: DC

## 2013-04-19 ENCOUNTER — Other Ambulatory Visit: Payer: Self-pay | Admitting: Physician Assistant

## 2013-04-19 DIAGNOSIS — R928 Other abnormal and inconclusive findings on diagnostic imaging of breast: Secondary | ICD-10-CM

## 2013-04-22 ENCOUNTER — Encounter: Payer: Self-pay | Admitting: Physician Assistant

## 2013-04-22 ENCOUNTER — Ambulatory Visit (INDEPENDENT_AMBULATORY_CARE_PROVIDER_SITE_OTHER): Payer: Medicare Other | Admitting: Physician Assistant

## 2013-04-22 VITALS — BP 118/72 | HR 108 | Temp 100.0°F | Resp 16 | Ht 65.5 in | Wt 213.0 lb

## 2013-04-22 DIAGNOSIS — J09X2 Influenza due to identified novel influenza A virus with other respiratory manifestations: Secondary | ICD-10-CM | POA: Diagnosis not present

## 2013-04-22 LAB — POC INFLUENZA A&B (BINAX/QUICKVUE)
INFLUENZA A, POC: POSITIVE
Influenza B, POC: NEGATIVE

## 2013-04-22 MED ORDER — PREDNISONE 20 MG PO TABS: ORAL_TABLET | ORAL | Status: DC

## 2013-04-22 MED ORDER — PROMETHAZINE HCL 25 MG PO TABS: 25.0000 mg | ORAL_TABLET | Freq: Four times a day (QID) | ORAL | Status: DC | PRN

## 2013-04-22 MED ORDER — HYDROCODONE-ACETAMINOPHEN 5-325 MG PO TABS: ORAL_TABLET | ORAL | Status: DC

## 2013-04-22 MED ORDER — AZITHROMYCIN 250 MG PO TABS: ORAL_TABLET | ORAL | Status: DC

## 2013-04-22 NOTE — Patient Instructions (Signed)

## 2013-04-22 NOTE — Progress Notes (Signed)
Subjective:    Patient ID: [name] [name], female    DOB: Apr 08, 1946, 68 y.o.   MRN: [id number]  Influenza This is a new problem. Episode onset: Saturday morning. The problem has been gradually worsening. Associated symptoms include chills, congestion, coughing, fatigue, a fever, headaches, myalgias and weakness. Pertinent negatives include no abdominal pain, anorexia, arthralgias, change in bowel habit, chest pain, diaphoresis, joint swelling, nausea, neck pain, numbness, rash, sore throat, swollen glands, urinary symptoms, vertigo, visual change or vomiting. Nothing aggravates the symptoms. Treatments tried: theraflu, mucinex D, zyrtec D, robitussin. The treatment provided no relief.   Current Outpatient Prescriptions on File Prior to Visit  Medication Sig Dispense Refill  . atorvastatin (LIPITOR) 80 MG tablet Take 80 mg by mouth daily. 1/2 TU TH SUN      . doxazosin (CARDURA) 4 MG tablet Take 4 mg by mouth daily. 1/2 qd      . DULoxetine (CYMBALTA) 30 MG capsule Take 30 mg by mouth 3 (three) times daily.       Marland Kitchen gabapentin (NEURONTIN) 800 MG tablet Take one tablet 3 times daily  90 tablet  0  . omeprazole (PRILOSEC) 40 MG capsule Take 40 mg by mouth daily.      Marland Kitchen oxyCODONE-acetaminophen (ROXICET) 5-325 MG per tablet Take 1 tablet by mouth every 6 (six) hours as needed.  60 tablet  0  . temazepam (RESTORIL) 30 MG capsule Take 30 mg by mouth at bedtime as needed for sleep.      . traMADol (ULTRAM) 50 MG tablet Take 50 mg by mouth 3 (three) times daily.      . verapamil (CALAN-SR) 240 MG CR tablet Take 240 mg by mouth at bedtime.       No current facility-administered medications on file prior to visit.   Allergies  Allergen Reactions  . Requip [Ropinirole Hcl] Shortness Of Breath and Nausea And Vomiting  . Codeine Other (See Comments)    fatigue  . Minocycline   . Morphine Sulfate   . Tetracyclines & Related     Hives  . Zestril [Lisinopril] Cough  . Zetia [Ezetimibe] Hives  .  Amoxicillin Rash  . Sulfa Antibiotics Other (See Comments)    headache    Past Medical History  Diagnosis Date  . Hypertension   . Arthritis   . Cataracts, bilateral   . Depression   . Anxiety   . GERD (gastroesophageal reflux disease)   . Hyperlipidemia   . Rib fracture   . Shingles   . Anemia   . Allergy   . Pre-diabetes   . Obese   . Gallstones      Review of Systems  Constitutional: Positive for fever, chills and fatigue. Negative for diaphoresis.  HENT: Positive for congestion, postnasal drip, rhinorrhea and sinus pressure. Negative for sore throat.   Eyes: Negative.   Respiratory: Positive for cough and wheezing. Negative for chest tightness, shortness of breath and stridor.   Cardiovascular: Negative for chest pain.  Gastrointestinal: Negative.  Negative for nausea, vomiting, abdominal pain, anorexia and change in bowel habit.  Genitourinary: Negative.   Musculoskeletal: Positive for myalgias. Negative for arthralgias, joint swelling and neck pain.  Skin: Negative for rash.  Neurological: Positive for weakness and headaches. Negative for vertigo and numbness.       Objective:   Physical Exam  Constitutional: She is oriented to person, place, and time. She appears well-developed and well-nourished.  HENT:  Head: Normocephalic and atraumatic.  Right Ear: External  ear normal.  Left Ear: External ear normal.  Mouth/Throat: Oropharynx is clear and moist.  Eyes: Conjunctivae and EOM are normal. Pupils are equal, round, and reactive to light.  Neck: Normal range of motion. Neck supple. No thyromegaly present.  Cardiovascular: Normal rate, regular rhythm and normal heart sounds.  Exam reveals no gallop and no friction rub.   No murmur heard. Pulmonary/Chest: Effort normal and breath sounds normal. No respiratory distress. She has no wheezes.  Abdominal: Soft. Bowel sounds are normal. She exhibits no distension and no mass. There is no tenderness. There is no rebound  and no guarding.  Musculoskeletal: Normal range of motion.  Lymphadenopathy:    She has no cervical adenopathy.  Neurological: She is alert and oriented to person, place, and time. She displays normal reflexes. No cranial nerve deficit. Coordination normal.  Skin: Skin is warm and dry.  Psychiatric: She has a normal mood and affect.      Assessment & Plan:  Influenza due to identified novel influenza A virus with other respiratory manifestations - Plan: POC Influenza A&B  + flu A Prednisone 20 #20 NR Norco 5/325 #60 NR Phenergan 25 #60 NR Rest, increase fluids, go to the ER  If she develops chest pain, shortness of breath, dizziness, or unable to hold down fluids.

## 2013-04-25 ENCOUNTER — Other Ambulatory Visit: Payer: Self-pay | Admitting: Physician Assistant

## 2013-04-25 MED ORDER — ALBUTEROL SULFATE HFA 108 (90 BASE) MCG/ACT IN AERS: 2.0000 | INHALATION_SPRAY | Freq: Four times a day (QID) | RESPIRATORY_TRACT | Status: DC | PRN

## 2013-04-25 MED ORDER — AZITHROMYCIN 250 MG PO TABS: ORAL_TABLET | ORAL | Status: AC

## 2013-05-02 ENCOUNTER — Ambulatory Visit: Payer: Self-pay

## 2013-05-08 ENCOUNTER — Other Ambulatory Visit: Payer: Self-pay

## 2013-05-08 ENCOUNTER — Other Ambulatory Visit: Payer: Self-pay | Admitting: Physician Assistant

## 2013-05-08 DIAGNOSIS — R928 Other abnormal and inconclusive findings on diagnostic imaging of breast: Secondary | ICD-10-CM

## 2013-05-10 ENCOUNTER — Ambulatory Visit
Admission: RE | Admit: 2013-05-10 | Discharge: 2013-05-10 | Disposition: A | Discharge status: Home / Self Care | Payer: BC Managed Care – PPO | Source: Ambulatory Visit | Attending: Physician Assistant | Admitting: Physician Assistant

## 2013-05-10 ENCOUNTER — Other Ambulatory Visit: Payer: Self-pay | Admitting: Physician Assistant

## 2013-05-10 DIAGNOSIS — R928 Other abnormal and inconclusive findings on diagnostic imaging of breast: Secondary | ICD-10-CM

## 2013-05-15 ENCOUNTER — Ambulatory Visit
Admission: RE | Admit: 2013-05-15 | Discharge: 2013-05-15 | Disposition: A | Discharge status: Home / Self Care | Payer: BC Managed Care – PPO | Source: Ambulatory Visit | Attending: Physician Assistant | Admitting: Physician Assistant

## 2013-05-15 ENCOUNTER — Ambulatory Visit
Admission: RE | Admit: 2013-05-15 | Discharge: 2013-05-15 | Disposition: A | Discharge status: Home / Self Care | Payer: Medicare Other | Source: Ambulatory Visit | Attending: Physician Assistant | Admitting: Physician Assistant

## 2013-05-15 ENCOUNTER — Other Ambulatory Visit: Payer: Self-pay | Admitting: Physician Assistant

## 2013-05-15 ENCOUNTER — Ambulatory Visit
Admission: RE | Admit: 2013-05-15 | Discharge: 2013-05-15 | Disposition: A | Discharge status: Home / Self Care | Payer: Medicare Other | Source: Ambulatory Visit

## 2013-05-15 DIAGNOSIS — N63 Unspecified lump in unspecified breast: Secondary | ICD-10-CM | POA: Diagnosis not present

## 2013-05-15 DIAGNOSIS — R928 Other abnormal and inconclusive findings on diagnostic imaging of breast: Secondary | ICD-10-CM

## 2013-05-15 DIAGNOSIS — C50919 Malignant neoplasm of unspecified site of unspecified female breast: Secondary | ICD-10-CM | POA: Diagnosis not present

## 2013-05-16 ENCOUNTER — Telehealth: Payer: Self-pay | Admitting: Internal Medicine

## 2013-05-16 ENCOUNTER — Ambulatory Visit: Payer: Self-pay

## 2013-05-16 NOTE — Telephone Encounter (Signed)
Pt called and canceled NV.

## 2013-05-20 ENCOUNTER — Encounter: Payer: Self-pay | Admitting: *Deleted

## 2013-05-20 ENCOUNTER — Telehealth: Payer: Self-pay | Admitting: *Deleted

## 2013-05-20 DIAGNOSIS — C50312 Malignant neoplasm of lower-inner quadrant of left female breast: Secondary | ICD-10-CM

## 2013-05-20 DIAGNOSIS — Z170 Estrogen receptor positive status [ER+]: Secondary | ICD-10-CM

## 2013-05-20 HISTORY — DX: Malignant neoplasm of lower-inner quadrant of left female breast: C50.312

## 2013-05-20 HISTORY — DX: Estrogen receptor positive status (ER+): Z17.0

## 2013-05-20 NOTE — Telephone Encounter (Signed)
Called and spoke with patient to discuss navigator resources.  Confirmed appointment with Dr. Donne Hazel on 05/22/13.  Patient asks if someone will call her about her surgery.  Informed her that her appointment is discuss surgical options not to have surgery.  She stated " When they called me from the breast center and gave me my results they told me I was going to have surgery and I have already made plans accordingly."  She was very distraught and requested [facility] number to call and get more information. Gave her the number and she hung up the phone.

## 2013-05-22 ENCOUNTER — Ambulatory Visit (INDEPENDENT_AMBULATORY_CARE_PROVIDER_SITE_OTHER): Payer: Medicare Other | Admitting: General Surgery

## 2013-05-22 ENCOUNTER — Encounter (INDEPENDENT_AMBULATORY_CARE_PROVIDER_SITE_OTHER): Payer: Self-pay

## 2013-05-22 VITALS — BP 134/82 | HR 88 | Temp 98.9°F | Resp 18 | Ht 66.0 in | Wt 222.0 lb

## 2013-05-22 DIAGNOSIS — C50312 Malignant neoplasm of lower-inner quadrant of left female breast: Secondary | ICD-10-CM

## 2013-05-22 DIAGNOSIS — C50319 Malignant neoplasm of lower-inner quadrant of unspecified female breast: Secondary | ICD-10-CM

## 2013-05-23 ENCOUNTER — Encounter: Payer: Self-pay | Admitting: *Deleted

## 2013-05-23 ENCOUNTER — Telehealth: Payer: Self-pay | Admitting: *Deleted

## 2013-05-23 ENCOUNTER — Encounter (INDEPENDENT_AMBULATORY_CARE_PROVIDER_SITE_OTHER): Payer: Self-pay | Admitting: General Surgery

## 2013-05-23 NOTE — Progress Notes (Signed)
Patient ID: [name] [name], female   DOB: 02/06/1946, 68 y.o.   MRN: [id number]  Chief Complaint  Patient presents with  . Establish Care    Left br ca    HPI [name] [name] is a 68 y.o. female.  Referred by Dr Ulyess Blossom HPI This is a 68 year old female with a family history of breast cancer in a sister in her 31s who presents after undergoing screening mammography. She has breast density category C. She was noted to have a mass in the lower inner quadrant of her left breast with spiculated margins and associated microcalcifications measuring approximately 10 mm. Target ultrasound was performed showing an irregular hypoechoic mass at 9:00 6-7 cm from the nipple that measured 5 x 4 x 5 mm. This corresponds with the mammography findings. No lymphadenopathy is seen in her left axilla. She underwent a core biopsy that shows a grade 2 invasive lobular carcinoma that is estrogen and progesterone receptor positive, HER-2/neu is not amplified, and her proliferation marker is 14%. She describes no complaints referable to either breast. She comes in today to discuss her options. Past Medical History  Diagnosis Date  . Hypertension   . Arthritis   . Cataracts, bilateral   . Depression   . Anxiety   . GERD (gastroesophageal reflux disease)   . Hyperlipidemia   . Rib fracture   . Shingles   . Anemia   . Allergy   . Pre-diabetes   . Obese   . Gallstones     Past Surgical History  Procedure Laterality Date  . Cataract extraction bilateral w/ anterior vitrectomy    . Abdominal hysterectomy    . Cholecystectomy    . Knee arthroscopy Bilateral     Family History  Problem Relation Age of Onset  . Colon polyps Neg Hx   . Esophageal cancer Neg Hx   . Rectal cancer Neg Hx   . Stomach cancer Neg Hx   . Heart attack Mother   . Stroke Mother   . COPD Father   . Hypertension Father     Social History History  Substance Use Topics  . Smoking status: Never Smoker   . Smokeless  tobacco: Never Used  . Alcohol Use: Yes     Comment: social    Allergies  Allergen Reactions  . Requip [Ropinirole Hcl] Shortness Of Breath and Nausea And Vomiting  . Codeine Other (See Comments)    fatigue  . Minocycline   . Morphine Sulfate   . Tetracyclines & Related     Hives  . Zestril [Lisinopril] Cough  . Zetia [Ezetimibe] Hives  . Amoxicillin Rash  . Sulfa Antibiotics Other (See Comments)    headache    Current Outpatient Prescriptions  Medication Sig Dispense Refill  . atorvastatin (LIPITOR) 80 MG tablet Take 80 mg by mouth daily. 1/2 TU TH SUN      . doxazosin (CARDURA) 4 MG tablet Take 4 mg by mouth daily. 1/2 qd      . DULoxetine (CYMBALTA) 30 MG capsule Take 30 mg by mouth 3 (three) times daily.       Marland Kitchen gabapentin (NEURONTIN) 800 MG tablet Take one tablet 3 times daily  90 tablet  0  . HYDROcodone-acetaminophen (NORCO) 5-325 MG per tablet 1/2-1 pill as needed for cough, pain, headache every 6 hours.  60 tablet  0  . omeprazole (PRILOSEC) 40 MG capsule Take 40 mg by mouth daily.      Marland Kitchen oxyCODONE-acetaminophen (ROXICET) 5-325  MG per tablet Take 1 tablet by mouth every 6 (six) hours as needed.  60 tablet  0  . promethazine (PHENERGAN) 25 MG tablet Take 1 tablet (25 mg total) by mouth every 6 (six) hours as needed for nausea or vomiting.  60 tablet  0  . temazepam (RESTORIL) 30 MG capsule Take 30 mg by mouth at bedtime as needed for sleep.      . traMADol (ULTRAM) 50 MG tablet Take 50 mg by mouth 3 (three) times daily.      . verapamil (CALAN-SR) 240 MG CR tablet Take 240 mg by mouth at bedtime.      Marland Kitchen albuterol (PROVENTIL HFA;VENTOLIN HFA) 108 (90 BASE) MCG/ACT inhaler Inhale 2 puffs into the lungs every 6 (six) hours as needed for wheezing or shortness of breath.  1 Inhaler  2  . predniSONE (DELTASONE) 20 MG tablet Take one tablet three times daily with food for 3 days, take one tablet two times daily with food for 3 days, take one tablet daily for 5 days.  20 tablet  0    No current facility-administered medications for this visit.    Review of Systems Review of Systems  Constitutional: Negative for fever, chills and unexpected weight change.  HENT: Negative for congestion, hearing loss, sore throat, trouble swallowing and voice change.   Eyes: Negative for visual disturbance.  Respiratory: Negative for cough and wheezing.   Cardiovascular: Negative for chest pain, palpitations and leg swelling.  Gastrointestinal: Negative for nausea, vomiting, abdominal pain, diarrhea, constipation, blood in stool, abdominal distention and anal bleeding.  Genitourinary: Negative for hematuria, vaginal bleeding and difficulty urinating.  Musculoskeletal: Negative for arthralgias.  Skin: Negative for rash and wound.  Neurological: Negative for seizures, syncope and headaches.  Hematological: Negative for adenopathy. Does not bruise/bleed easily.  Psychiatric/Behavioral: Negative for confusion.    Blood pressure 134/82, pulse 88, temperature 98.9 F (37.2 C), resp. rate 18, height _0  (1.676 m), weight 222 lb (100.699 kg).  Physical Exam Physical Exam  Vitals reviewed. Constitutional: She appears well-developed and well-nourished.  HENT:  Head: Normocephalic and atraumatic.  Eyes: No scleral icterus.  Neck: Neck supple.  Cardiovascular: Normal rate, regular rhythm and normal heart sounds.   Pulmonary/Chest: Effort normal and breath sounds normal. She has no wheezes. She has no rales. Right breast exhibits no inverted nipple, no mass, no nipple discharge, no skin change and no tenderness. Left breast exhibits no inverted nipple, no mass, no nipple discharge, no skin change and no tenderness.  Lymphadenopathy:    She has no cervical adenopathy.    She has no axillary adenopathy.       Right: No supraclavicular adenopathy present.       Left: No supraclavicular adenopathy present.    Data Reviewed EXAM:  DIGITAL DIAGNOSTIC LEFT MAMMOGRAM  ULTRASOUND LEFT  BREAST  COMPARISON: Previous exams.  ACR Breast Density Category c: The breast tissue is heterogeneously  dense, which may obscure small masses.  FINDINGS:  There is a mass in the lower far inner left breast with spiculated  margins and associated microcalcifications measuring approximately  10 mm. This is confirmed on the additional spot compression CC and  MLO views.  Physical examination of the inner left breast does not definitely  reveal any palpable masses.  Targeted ultrasound of the left breast was performed demonstrating  an irregular hypoechoic mass at 9 o'clock 6-7 cm from the nipple  (this was initially described as 4 cm from the nipple, however  appeared more medial on the imaging than initially described)  measuring 0.5 x 0.4 x 0.5 cm. This corresponds with mammography  findings.  No lymphadenopathy is seen in the left axilla.  IMPRESSION:  Suspicious mass in the left breast at 9 o'clock 6-7 cm from the  nipple.    Assessment    Clinical stage I left breast invasive lobular cancer    Plan    Left breast seed guided lumpectomy, left axillary sentinel node biopsy     We discussed the staging and pathophysiology of breast cancer. We discussed all of the different options for treatment for breast cancer including surgery, chemotherapy, radiation therapy, Herceptin, and antiestrogen therapy.   We discussed a sentinel lymph node biopsy as she does not appear to having lymph node involvement right now. We discussed the performance of that with injection of radioactive tracer and possibly blue dye. We discussed that she would have an incision underneath her axillary hairline. We discussed that there is achance of having a positive node with a sentinel lymph node biopsy and we will await the permanent pathology to make any other first further decisions in terms of her treatment. One of these options might be to return to the operating room to perform an axillary lymph node  dissection. We discussed up to a 5% risk lifetime of chronic shoulder pain as well as lymphedema associated with a sentinel lymph node biopsy.  We discussed the options for treatment of the breast cancer which included lumpectomy versus a mastectomy. We discussed the performance of the lumpectomy with a wire placement. We discussed a 5-10% chance of a positive margin requiring reexcision in the operating room. We also discussed that she may need radiation therapy or antiestrogen therapy or both if she undergoes lumpectomy. We discussed the mastectomy and the postoperative care for that as well. We discussed that there is no difference in her survival whether she undergoes lumpectomy with radiation therapy or antiestrogen therapy versus a mastectomy.  We will have her see med onc and rad onc after surgery.  Referrals have been made. We discussed the risks of operation including bleeding, infection, possible reoperation. She understands her further therapy will be based on what her stages at the time of her operation.      Rashad Obeid 05/23/2013, 12:23 PM

## 2013-05-23 NOTE — Telephone Encounter (Signed)
Received appt date and time from Dr. Humphrey Rolls and called and left a message at home and on cell for pt to return my call so I can schedule a med onc appt.

## 2013-05-23 NOTE — Progress Notes (Signed)
[place] referral in [place].  Took paperwork to Dr. Humphrey Rolls for an appt time and date.  Emailed Alisha at [facility] to make her aware.

## 2013-05-27 ENCOUNTER — Encounter: Payer: Self-pay | Admitting: *Deleted

## 2013-05-27 NOTE — Progress Notes (Signed)
Called [name] [name] in [place] to see if she could coordinate the Rad Onc appt w/ the Med Onc appt for a same day visit and she doesn't have anything.  She also doesn't have a referral in [facility] to make the appt.  Emailed Alisha & Dr. Donne Hazel at [facility] to make them aware of Dr. Laurelyn Sickle appt and to request for them to place a referral in [facility] - per the pts request.  Mailed before appt letter, welcome packet & intake form to pt.  Took paperwork to Med [facility] for chart.

## 2013-05-30 ENCOUNTER — Encounter (HOSPITAL_BASED_OUTPATIENT_CLINIC_OR_DEPARTMENT_OTHER): Payer: Self-pay | Admitting: *Deleted

## 2013-05-30 NOTE — Progress Notes (Signed)
Pt to come in for labs and ekg She is going 2/24 for radioactive seeds-then surgery 2/26

## 2013-05-31 ENCOUNTER — Encounter (HOSPITAL_BASED_OUTPATIENT_CLINIC_OR_DEPARTMENT_OTHER)
Admission: RE | Admit: 2013-05-31 | Discharge: 2013-05-31 | Disposition: A | Discharge status: Home / Self Care | Payer: Medicare Other | Source: Ambulatory Visit | Attending: General Surgery | Admitting: General Surgery

## 2013-05-31 ENCOUNTER — Other Ambulatory Visit: Payer: Self-pay

## 2013-05-31 DIAGNOSIS — Z01810 Encounter for preprocedural cardiovascular examination: Secondary | ICD-10-CM | POA: Insufficient documentation

## 2013-05-31 LAB — CBC WITH DIFFERENTIAL/PLATELET
BASOS PCT: 1 % (ref 0–1)
Basophils Absolute: 0.1 10*3/uL (ref 0.0–0.1)
Eosinophils Absolute: 0.1 10*3/uL (ref 0.0–0.7)
Eosinophils Relative: 1 % (ref 0–5)
HCT: 33.2 % — ABNORMAL LOW (ref 36.0–46.0)
Hemoglobin: 10.9 g/dL — ABNORMAL LOW (ref 12.0–15.0)
Lymphocytes Relative: 36 % (ref 12–46)
Lymphs Abs: 3.5 10*3/uL (ref 0.7–4.0)
MCH: 27.6 pg (ref 26.0–34.0)
MCHC: 32.8 g/dL (ref 30.0–36.0)
MCV: 84.1 fL (ref 78.0–100.0)
Monocytes Absolute: 0.8 10*3/uL (ref 0.1–1.0)
Monocytes Relative: 8 % (ref 3–12)
NEUTROS ABS: 5.2 10*3/uL (ref 1.7–7.7)
NEUTROS PCT: 54 % (ref 43–77)
Platelets: 318 10*3/uL (ref 150–400)
RBC: 3.95 MIL/uL (ref 3.87–5.11)
RDW: 15.6 % — ABNORMAL HIGH (ref 11.5–15.5)
WBC: 9.6 10*3/uL (ref 4.0–10.5)

## 2013-05-31 LAB — COMPREHENSIVE METABOLIC PANEL
ALBUMIN: 3.7 g/dL (ref 3.5–5.2)
ALT: 14 U/L (ref 0–35)
AST: 15 U/L (ref 0–37)
Alkaline Phosphatase: 111 U/L (ref 39–117)
BUN: 18 mg/dL (ref 6–23)
CO2: 28 mEq/L (ref 19–32)
Calcium: 9.5 mg/dL (ref 8.4–10.5)
Chloride: 102 mEq/L (ref 96–112)
Creatinine, Ser: 0.78 mg/dL (ref 0.50–1.10)
GFR calc Af Amer: >90 mL/min (ref >90)
GFR calc non Af Amer: 85 mL/min — ABNORMAL LOW (ref >90)
Glucose, Bld: 100 mg/dL — ABNORMAL HIGH (ref 70–99)
POTASSIUM: 3.7 meq/L (ref 3.7–5.3)
SODIUM: 143 meq/L (ref 137–147)
Total Bilirubin: <0.2 mg/dL — ABNORMAL LOW (ref 0.3–1.2)
Total Protein: 7.4 g/dL (ref 6.0–8.3)

## 2013-06-01 LAB — CANCER ANTIGEN 27.29: CA 27.29: 30 U/mL (ref 0–39)

## 2013-06-04 ENCOUNTER — Ambulatory Visit
Admission: RE | Admit: 2013-06-04 | Discharge: 2013-06-04 | Disposition: A | Discharge status: Home / Self Care | Payer: BC Managed Care – PPO | Source: Ambulatory Visit | Attending: General Surgery | Admitting: General Surgery

## 2013-06-04 DIAGNOSIS — C50312 Malignant neoplasm of lower-inner quadrant of left female breast: Secondary | ICD-10-CM

## 2013-06-04 DIAGNOSIS — N63 Unspecified lump in unspecified breast: Secondary | ICD-10-CM | POA: Diagnosis not present

## 2013-06-06 ENCOUNTER — Ambulatory Visit
Admit: 2013-06-06 | Discharge: 2013-06-06 | Disposition: A | Discharge status: Home / Self Care | Payer: BC Managed Care – PPO | Attending: General Surgery | Admitting: General Surgery

## 2013-06-06 ENCOUNTER — Ambulatory Visit (HOSPITAL_BASED_OUTPATIENT_CLINIC_OR_DEPARTMENT_OTHER): Payer: Medicare Other | Admitting: Certified Registered"

## 2013-06-06 ENCOUNTER — Encounter (HOSPITAL_BASED_OUTPATIENT_CLINIC_OR_DEPARTMENT_OTHER): Payer: Self-pay

## 2013-06-06 ENCOUNTER — Ambulatory Visit (HOSPITAL_BASED_OUTPATIENT_CLINIC_OR_DEPARTMENT_OTHER)
Admission: RE | Admit: 2013-06-06 | Discharge: 2013-06-06 | Disposition: A | Discharge status: Home / Self Care | Payer: Medicare Other | Source: Ambulatory Visit | Attending: General Surgery | Admitting: General Surgery

## 2013-06-06 ENCOUNTER — Encounter (HOSPITAL_BASED_OUTPATIENT_CLINIC_OR_DEPARTMENT_OTHER)
Admission: RE | Disposition: A | Discharge status: Home / Self Care | Payer: Self-pay | Source: Ambulatory Visit | Attending: General Surgery

## 2013-06-06 ENCOUNTER — Encounter (HOSPITAL_COMMUNITY)
Admission: RE | Admit: 2013-06-06 | Discharge: 2013-06-06 | Disposition: A | Discharge status: Home / Self Care | Payer: Medicare Other | Source: Ambulatory Visit | Attending: General Surgery | Admitting: General Surgery

## 2013-06-06 ENCOUNTER — Encounter (HOSPITAL_BASED_OUTPATIENT_CLINIC_OR_DEPARTMENT_OTHER): Payer: Medicare Other | Admitting: Certified Registered"

## 2013-06-06 DIAGNOSIS — F3289 Other specified depressive episodes: Secondary | ICD-10-CM | POA: Diagnosis not present

## 2013-06-06 DIAGNOSIS — Z170 Estrogen receptor positive status [ER+]: Secondary | ICD-10-CM | POA: Diagnosis not present

## 2013-06-06 DIAGNOSIS — C50319 Malignant neoplasm of lower-inner quadrant of unspecified female breast: Secondary | ICD-10-CM | POA: Insufficient documentation

## 2013-06-06 DIAGNOSIS — Z803 Family history of malignant neoplasm of breast: Secondary | ICD-10-CM | POA: Diagnosis not present

## 2013-06-06 DIAGNOSIS — K219 Gastro-esophageal reflux disease without esophagitis: Secondary | ICD-10-CM | POA: Insufficient documentation

## 2013-06-06 DIAGNOSIS — G8918 Other acute postprocedural pain: Secondary | ICD-10-CM | POA: Diagnosis not present

## 2013-06-06 DIAGNOSIS — D649 Anemia, unspecified: Secondary | ICD-10-CM | POA: Diagnosis not present

## 2013-06-06 DIAGNOSIS — Z9849 Cataract extraction status, unspecified eye: Secondary | ICD-10-CM | POA: Diagnosis not present

## 2013-06-06 DIAGNOSIS — N644 Mastodynia: Secondary | ICD-10-CM | POA: Diagnosis not present

## 2013-06-06 DIAGNOSIS — F411 Generalized anxiety disorder: Secondary | ICD-10-CM | POA: Insufficient documentation

## 2013-06-06 DIAGNOSIS — F329 Major depressive disorder, single episode, unspecified: Secondary | ICD-10-CM | POA: Diagnosis not present

## 2013-06-06 DIAGNOSIS — I10 Essential (primary) hypertension: Secondary | ICD-10-CM | POA: Diagnosis not present

## 2013-06-06 DIAGNOSIS — IMO0002 Reserved for concepts with insufficient information to code with codable children: Secondary | ICD-10-CM | POA: Diagnosis not present

## 2013-06-06 DIAGNOSIS — E669 Obesity, unspecified: Secondary | ICD-10-CM | POA: Insufficient documentation

## 2013-06-06 DIAGNOSIS — C50312 Malignant neoplasm of lower-inner quadrant of left female breast: Secondary | ICD-10-CM

## 2013-06-06 DIAGNOSIS — C50919 Malignant neoplasm of unspecified site of unspecified female breast: Secondary | ICD-10-CM | POA: Diagnosis not present

## 2013-06-06 DIAGNOSIS — E785 Hyperlipidemia, unspecified: Secondary | ICD-10-CM | POA: Diagnosis not present

## 2013-06-06 DIAGNOSIS — N63 Unspecified lump in unspecified breast: Secondary | ICD-10-CM | POA: Diagnosis not present

## 2013-06-06 HISTORY — DX: Presence of spectacles and contact lenses: Z97.3

## 2013-06-06 HISTORY — DX: Other specified postprocedural states: R11.2

## 2013-06-06 HISTORY — DX: Other specified postprocedural states: Z98.890

## 2013-06-06 SURGERY — RADIOACTIVE SEED GUIDED PARTIAL MASTECTOMY WITH AXILLARY SENTINEL LYMPH NODE BIOPSY AND AXILLARY LYMPH NODE DISSECTION
Anesthesia: Regional | Site: Breast | Laterality: Left

## 2013-06-06 MED ORDER — FENTANYL CITRATE 0.05 MG/ML IJ SOLN
50.0000 ug | INTRAMUSCULAR | Status: DC | PRN
Start: 2013-06-06 — End: 2013-06-06
  Administered 2013-06-06: 50 ug via INTRAVENOUS
  Administered 2013-06-06: 100 ug via INTRAVENOUS

## 2013-06-06 MED ORDER — HYDROMORPHONE HCL PF 1 MG/ML IJ SOLN
0.2500 mg | INTRAMUSCULAR | Status: DC | PRN
  Administered 2013-06-06: 0.5 mg via INTRAVENOUS
  Administered 2013-06-06 (×2): 0.25 mg via INTRAVENOUS

## 2013-06-06 MED ORDER — HYDROMORPHONE HCL PF 1 MG/ML IJ SOLN
INTRAMUSCULAR | Status: AC
  Filled 2013-06-06: qty 1

## 2013-06-06 MED ORDER — VANCOMYCIN HCL IN DEXTROSE 500-5 MG/100ML-% IV SOLN
INTRAVENOUS | Status: AC
  Filled 2013-06-06: qty 100

## 2013-06-06 MED ORDER — MIDAZOLAM HCL 2 MG/2ML IJ SOLN
INTRAMUSCULAR | Status: AC
  Filled 2013-06-06: qty 2

## 2013-06-06 MED ORDER — BUPIVACAINE-EPINEPHRINE PF 0.5-1:200000 % IJ SOLN
INTRAMUSCULAR | Status: DC | PRN
  Administered 2013-06-06: 30 mL via PERINEURAL

## 2013-06-06 MED ORDER — LIDOCAINE HCL (CARDIAC) 20 MG/ML IV SOLN
INTRAVENOUS | Status: DC | PRN
  Administered 2013-06-06: 80 mg via INTRAVENOUS

## 2013-06-06 MED ORDER — VANCOMYCIN HCL IN DEXTROSE 1-5 GM/200ML-% IV SOLN
INTRAVENOUS | Status: AC
  Filled 2013-06-06: qty 200

## 2013-06-06 MED ORDER — BUPIVACAINE HCL (PF) 0.25 % IJ SOLN
INTRAMUSCULAR | Status: AC
  Filled 2013-06-06: qty 30

## 2013-06-06 MED ORDER — SODIUM CHLORIDE 0.9 % IJ SOLN
INTRAMUSCULAR | Status: AC
  Filled 2013-06-06: qty 10

## 2013-06-06 MED ORDER — LACTATED RINGERS IV SOLN
INTRAVENOUS | Status: DC
  Administered 2013-06-06 (×2): via INTRAVENOUS

## 2013-06-06 MED ORDER — HYDROCODONE-ACETAMINOPHEN 10-325 MG PO TABS: 1.0000 | ORAL_TABLET | Freq: Four times a day (QID) | ORAL | Status: DC | PRN

## 2013-06-06 MED ORDER — FENTANYL CITRATE 0.05 MG/ML IJ SOLN
INTRAMUSCULAR | Status: AC
  Filled 2013-06-06: qty 2

## 2013-06-06 MED ORDER — ONDANSETRON HCL 4 MG/2ML IJ SOLN
INTRAMUSCULAR | Status: DC | PRN
  Administered 2013-06-06: 4 mg via INTRAVENOUS

## 2013-06-06 MED ORDER — PROPOFOL 10 MG/ML IV BOLUS
INTRAVENOUS | Status: DC | PRN
  Administered 2013-06-06: 20 mg via INTRAVENOUS
  Administered 2013-06-06: 170 mg via INTRAVENOUS

## 2013-06-06 MED ORDER — FENTANYL CITRATE 0.05 MG/ML IJ SOLN
INTRAMUSCULAR | Status: AC
  Filled 2013-06-06: qty 4

## 2013-06-06 MED ORDER — ONDANSETRON HCL 4 MG/2ML IJ SOLN: 4.0000 mg | Freq: Four times a day (QID) | INTRAMUSCULAR | Status: DC | PRN

## 2013-06-06 MED ORDER — BUPIVACAINE HCL (PF) 0.25 % IJ SOLN
INTRAMUSCULAR | Status: DC | PRN
  Administered 2013-06-06: 7 mL

## 2013-06-06 MED ORDER — DEXAMETHASONE SODIUM PHOSPHATE 4 MG/ML IJ SOLN
INTRAMUSCULAR | Status: DC | PRN
  Administered 2013-06-06: 10 mg via INTRAVENOUS

## 2013-06-06 MED ORDER — METHYLENE BLUE 1 % INJ SOLN
INTRAMUSCULAR | Status: AC
  Filled 2013-06-06: qty 10

## 2013-06-06 MED ORDER — MIDAZOLAM HCL 2 MG/2ML IJ SOLN
1.0000 mg | INTRAMUSCULAR | Status: DC | PRN
  Administered 2013-06-06: 2 mg via INTRAVENOUS
  Administered 2013-06-06: 1 mg via INTRAVENOUS

## 2013-06-06 MED ORDER — OXYCODONE HCL 5 MG/5ML PO SOLN: 5.0000 mg | Freq: Once | ORAL | Status: AC | PRN

## 2013-06-06 MED ORDER — SODIUM CHLORIDE 0.9 % IV SOLN
1500.0000 mg | INTRAVENOUS | Status: AC
  Administered 2013-06-06: 1500 mg via INTRAVENOUS

## 2013-06-06 MED ORDER — OXYCODONE HCL 5 MG PO TABS
5.0000 mg | ORAL_TABLET | Freq: Once | ORAL | Status: AC | PRN
  Administered 2013-06-06: 5 mg via ORAL

## 2013-06-06 MED ORDER — TECHNETIUM TC 99M SULFUR COLLOID FILTERED
1.0000 | Freq: Once | INTRAVENOUS | Status: AC | PRN
  Administered 2013-06-06: 1 via INTRADERMAL

## 2013-06-06 MED ORDER — OXYCODONE HCL 5 MG PO TABS
ORAL_TABLET | ORAL | Status: AC
  Filled 2013-06-06: qty 1

## 2013-06-06 SURGICAL SUPPLY — 67 items
ADH SKN CLS APL DERMABOND .7 (GAUZE/BANDAGES/DRESSINGS) ×1
APL SKNCLS STERI-STRIP NONHPOA (GAUZE/BANDAGES/DRESSINGS) ×1
APPLIER CLIP 9.375 MED OPEN (MISCELLANEOUS) ×3
APR CLP MED 9.3 20 MLT OPN (MISCELLANEOUS) ×1
BENZOIN TINCTURE PRP APPL 2/3 (GAUZE/BANDAGES/DRESSINGS) ×3 IMPLANT
BINDER BREAST LRG (GAUZE/BANDAGES/DRESSINGS) IMPLANT
BINDER BREAST MEDIUM (GAUZE/BANDAGES/DRESSINGS) IMPLANT
BINDER BREAST XLRG (GAUZE/BANDAGES/DRESSINGS) IMPLANT
BINDER BREAST XXLRG (GAUZE/BANDAGES/DRESSINGS) ×2 IMPLANT
BLADE SURG 15 STRL LF DISP TIS (BLADE) ×2 IMPLANT
BLADE SURG 15 STRL SS (BLADE) ×6
CANISTER SUC SOCK COL 7IN (MISCELLANEOUS) ×1 IMPLANT
CANISTER SUCT 1200ML W/VALVE (MISCELLANEOUS) ×1 IMPLANT
CHLORAPREP W/TINT 26ML (MISCELLANEOUS) ×3 IMPLANT
CLIP APPLIE 9.375 MED OPEN (MISCELLANEOUS) ×1 IMPLANT
CLOSURE WOUND 1/2 X4 (GAUZE/BANDAGES/DRESSINGS) ×1
COVER MAYO STAND STRL (DRAPES) ×3 IMPLANT
COVER PROBE W GEL 5X96 (DRAPES) ×3 IMPLANT
COVER TABLE BACK 60X90 (DRAPES) ×3 IMPLANT
DECANTER SPIKE VIAL GLASS SM (MISCELLANEOUS) IMPLANT
DERMABOND ADVANCED (GAUZE/BANDAGES/DRESSINGS) ×2
DERMABOND ADVANCED .7 DNX12 (GAUZE/BANDAGES/DRESSINGS) ×1 IMPLANT
DEVICE DUBIN W/COMP PLATE 8390 (MISCELLANEOUS) ×3 IMPLANT
DRAIN CHANNEL 19F RND (DRAIN) IMPLANT
DRAPE LAPAROSCOPIC ABDOMINAL (DRAPES) ×3 IMPLANT
DRSG TEGADERM 4X4.75 (GAUZE/BANDAGES/DRESSINGS) ×3 IMPLANT
ELECT COATED BLADE 2.86 ST (ELECTRODE) ×3 IMPLANT
ELECT REM PT RETURN 9FT ADLT (ELECTROSURGICAL) ×3
ELECTRODE REM PT RTRN 9FT ADLT (ELECTROSURGICAL) ×1 IMPLANT
EVACUATOR SILICONE 100CC (DRAIN) IMPLANT
GLOVE BIO SURGEON STRL SZ7 (GLOVE) ×7 IMPLANT
GLOVE BIOGEL PI IND STRL 7.5 (GLOVE) ×1 IMPLANT
GLOVE BIOGEL PI INDICATOR 7.5 (GLOVE) ×4
GOWN STRL REUS W/ TWL LRG LVL3 (GOWN DISPOSABLE) ×2 IMPLANT
GOWN STRL REUS W/TWL LRG LVL3 (GOWN DISPOSABLE) ×6
KIT MARKER MARGIN INK (KITS) ×3 IMPLANT
NDL HYPO 25X1 1.5 SAFETY (NEEDLE) IMPLANT
NEEDLE HYPO 25X1 1.5 SAFETY (NEEDLE) ×3 IMPLANT
NS IRRIG 1000ML POUR BTL (IV SOLUTION) ×1 IMPLANT
PACK BASIN DAY SURGERY FS (CUSTOM PROCEDURE TRAY) ×3 IMPLANT
PENCIL BUTTON HOLSTER BLD 10FT (ELECTRODE) ×3 IMPLANT
PIN SAFETY STERILE (MISCELLANEOUS) ×1 IMPLANT
SHEET MEDIUM DRAPE 40X70 STRL (DRAPES) IMPLANT
SLEEVE SCD COMPRESS KNEE MED (MISCELLANEOUS) ×3 IMPLANT
SPONGE GAUZE 4X4 12PLY STER LF (GAUZE/BANDAGES/DRESSINGS) ×3 IMPLANT
SPONGE LAP 4X18 X RAY DECT (DISPOSABLE) ×3 IMPLANT
STAPLER VISISTAT 35W (STAPLE) ×3 IMPLANT
STOCKINETTE IMPERVIOUS LG (DRAPES) IMPLANT
STRIP CLOSURE SKIN 1/2X4 (GAUZE/BANDAGES/DRESSINGS) ×2 IMPLANT
SUT ETHILON 2 0 FS 18 (SUTURE) IMPLANT
SUT MNCRL AB 4-0 PS2 18 (SUTURE) ×5 IMPLANT
SUT MON AB 5-0 PS2 18 (SUTURE) IMPLANT
SUT SILK 2 0 SH (SUTURE) ×4 IMPLANT
SUT VIC AB 2-0 SH 27 (SUTURE) ×9
SUT VIC AB 2-0 SH 27XBRD (SUTURE) ×3 IMPLANT
SUT VIC AB 3-0 SH 27 (SUTURE) ×3
SUT VIC AB 3-0 SH 27X BRD (SUTURE) ×1 IMPLANT
SUT VIC AB 5-0 PS2 18 (SUTURE) IMPLANT
SUT VICRYL AB 2 0 TIE (SUTURE) IMPLANT
SUT VICRYL AB 2 0 TIES (SUTURE)
SUT VICRYL AB 3 0 TIES (SUTURE) IMPLANT
SYR CONTROL 10ML LL (SYRINGE) ×2 IMPLANT
TOWEL OR 17X24 6PK STRL BLUE (TOWEL DISPOSABLE) ×3 IMPLANT
TOWEL OR NON WOVEN STRL DISP B (DISPOSABLE) ×3 IMPLANT
TUBE CONNECTING 20'X1/4 (TUBING)
TUBE CONNECTING 20X1/4 (TUBING) ×1 IMPLANT
YANKAUER SUCT BULB TIP NO VENT (SUCTIONS) IMPLANT

## 2013-06-06 NOTE — Interval H&P Note (Signed)
History and Physical Interval Note:  06/06/2013 10:32 AM  [name] [name]  has presented today for surgery, with the diagnosis of left breast cancer  The various methods of treatment have been discussed with the patient and family. After consideration of risks, benefits and other options for treatment, the patient has consented to  Procedure(s): LEFT BREAST RADIOACTIVE SEED GUIDED LUMPECTOMY AND LEFT AXILLARY SENTINEL LYMPH NODE BIOPSY (Left) as a surgical intervention .  The patient's history has been reviewed, patient examined, no change in status, stable for surgery.  I have reviewed the patient's chart and labs.  Questions were answered to the patient's satisfaction.     [name] [name]

## 2013-06-06 NOTE — Discharge Instructions (Signed)
[facility] °Office Phone Number [phone] ° °BREAST BIOPSY/ PARTIAL MASTECTOMY: POST OP INSTRUCTIONS ° °Always review your discharge instruction sheet given to you by the facility where your surgery was performed. ° °IF YOU HAVE DISABILITY OR FAMILY LEAVE FORMS, YOU MUST BRING THEM TO THE OFFICE FOR PROCESSING.  DO NOT GIVE THEM TO YOUR DOCTOR. ° °1. A prescription for pain medication may be given to you upon discharge.  Take your pain medication as prescribed, if needed.  If narcotic pain medicine is not needed, then you may take acetaminophen (Tylenol), naprosyn (Alleve) or ibuprofen (Advil) as needed. °2. Take your usually prescribed medications unless otherwise directed °3. If you need a refill on your pain medication, please contact your pharmacy.  They will contact our office to request authorization.  Prescriptions will not be filled after 5pm or on week-ends. °4. You should eat very light the first 24 hours after surgery, such as soup, crackers, pudding, etc.  Resume your normal diet the day after surgery. °5. Most patients will experience some swelling and bruising in the breast.  Ice packs and a good support bra will help.  Wear the breast binder provided or a sports bra for 72 hours day and night.  After that wear a sports bra during the day until you return to the office. Swelling and bruising can take several days to resolve.  °6. It is common to experience some constipation if taking pain medication after surgery.  Increasing fluid intake and taking a stool softener will usually help or prevent this problem from occurring.  A mild laxative (Milk of Magnesia or Miralax) should be taken according to package directions if there are no bowel movements after 48 hours. °7. Unless discharge instructions indicate otherwise, you may remove your bandages 48 hours after surgery and you may shower at that time.  You may have steri-strips (small skin tapes) in place directly over the incision.   These strips should be left on the skin for 7-10 days and will come off on their own.  If your surgeon used skin glue on the incision, you may shower in 24 hours.  The glue will flake off over the next 2-3 weeks.  Any sutures or staples will be removed at the office during your follow-up visit. °8. ACTIVITIES:  You may resume regular daily activities (gradually increasing) beginning the next day.  Wearing a good support bra or sports bra minimizes pain and swelling.  You may have sexual intercourse when it is comfortable. °a. You may drive when you no longer are taking prescription pain medication, you can comfortably wear a seatbelt, and you can safely maneuver your car and apply brakes. °b. RETURN TO WORK:  ______________________________________________________________________________________ °9. You should see your doctor in the office for a follow-up appointment approximately two weeks after your surgery.  Your doctor’s nurse will typically make your follow-up appointment when she calls you with your pathology report.  Expect your pathology report 3-4 business days after your surgery.  You may call to check if you do not hear from us after three days. °10. OTHER INSTRUCTIONS: _______________________________________________________________________________________________ _____________________________________________________________________________________________________________________________________ °_____________________________________________________________________________________________________________________________________ °_____________________________________________________________________________________________________________________________________ ° °WHEN TO CALL DR WAKEFIELD: °1. Fever over 101.0 °2. Nausea and/or vomiting. °3. Extreme swelling or bruising. °4. Continued bleeding from incision. °5. Increased pain, redness, or drainage from the incision. ° °The clinic staff is available to  answer your questions during regular business hours.  Please don’t hesitate to call and ask to speak to one of the nurses for   clinical concerns.  If you have a medical emergency, go to the nearest emergency room or call 911.  A surgeon from [facility] is always on call at the hospital. ° °For further questions, please visit [email or web address] ° ° ° °Post Anesthesia Home Care Instructions ° °Activity: °Get plenty of rest for the remainder of the day. A responsible adult should stay with you for 24 hours following the procedure.  °For the next 24 hours, DO NOT: °-Drive a car °-Operate machinery °-Drink alcoholic beverages °-Take any medication unless instructed by your physician °-Make any legal decisions or sign important papers. ° °Meals: °Start with liquid foods such as gelatin or soup. Progress to regular foods as tolerated. Avoid greasy, spicy, heavy foods. If nausea and/or vomiting occur, drink only clear liquids until the nausea and/or vomiting subsides. Call your physician if vomiting continues. ° °Special Instructions/Symptoms: °Your throat may feel dry or sore from the anesthesia or the breathing tube placed in your throat during surgery. If this causes discomfort, gargle with warm salt water. The discomfort should disappear within 24 hours. ° ° °Regional Anesthesia Blocks ° °1. Numbness or the inability to move the "blocked" extremity may last from 3-48 hours after placement. The length of time depends on the medication injected and your individual response to the medication. If the numbness is not going away after 48 hours, call your surgeon. ° °2. The extremity that is blocked will need to be protected until the numbness is gone and the  Strength has returned. Because you cannot feel it, you will need to take extra care to avoid injury. Because it may be weak, you may have difficulty moving it or using it. You may not know what position it is in without looking at it while the  block is in effect. ° °3. For blocks in the legs and feet, returning to weight bearing and walking needs to be done carefully. You will need to wait until the numbness is entirely gone and the strength has returned. You should be able to move your leg and foot normally before you try and bear weight or walk. You will need someone to be with you when you first try to ensure you do not fall and possibly risk injury. ° °4. Bruising and tenderness at the needle site are common side effects and will resolve in a few days. ° °5. Persistent numbness or new problems with movement should be communicated to the surgeon or the [facility] ([phone])/ [facility] ([phone]). °

## 2013-06-06 NOTE — Progress Notes (Signed)
Assisted Dr. Marcie Bal with left, ultrasound guided, pectoralis block. Side rails up, monitors on throughout procedure. See vital signs in flow sheet. Tolerated Procedure well.

## 2013-06-06 NOTE — Anesthesia Postprocedure Evaluation (Signed)
Anesthesia Post Note  Patient: [name] [name]  Procedure(s) Performed: Procedure(s) (LRB): LEFT BREAST RADIOACTIVE SEED GUIDED LUMPECTOMY AND LEFT AXILLARY SENTINEL LYMPH NODE BIOPSY (Left)  Anesthesia type: General and PECS block  Patient location: PACU  Post pain: Pain level controlled and Adequate analgesia  Post assessment: Post-op Vital signs reviewed, Patient's Cardiovascular Status Stable, Respiratory Function Stable, Patent Airway and Pain level controlled  Last Vitals:  Filed Vitals:   06/06/13 1300  BP: 157/76  Pulse: 89  Temp:   Resp: 7    Post vital signs: Reviewed and stable  Level of consciousness: awake, alert  and oriented  Complications: No apparent anesthesia complications

## 2013-06-06 NOTE — Anesthesia Preprocedure Evaluation (Signed)
Anesthesia Evaluation  Patient identified by MRN, date of birth, ID band Patient awake    Reviewed: Allergy & Precautions, H&P , NPO status , Patient's Chart, lab work & pertinent test results  History of Anesthesia Complications (+) PONV  Airway Mallampati: II  Neck ROM: full    Dental   Pulmonary          Cardiovascular hypertension, + dysrhythmias     Neuro/Psych Anxiety Depression    GI/Hepatic GERD-  ,  Endo/Other  obese  Renal/GU      Musculoskeletal   Abdominal   Peds  Hematology   Anesthesia Other Findings   Reproductive/Obstetrics                           Anesthesia Physical Anesthesia Plan  ASA: II  Anesthesia Plan: General and Regional   Post-op Pain Management: MAC Combined w/ Regional for Post-op pain   Induction: Intravenous  Airway Management Planned: LMA  Additional Equipment:   Intra-op Plan:   Post-operative Plan:   Informed Consent: I have reviewed the patients History and Physical, chart, labs and discussed the procedure including the risks, benefits and alternatives for the proposed anesthesia with the patient or authorized representative who has indicated his/her understanding and acceptance.     Plan Discussed with: CRNA, Anesthesiologist and Surgeon  Anesthesia Plan Comments:         Anesthesia Quick Evaluation

## 2013-06-06 NOTE — Anesthesia Procedure Notes (Addendum)
Anesthesia Regional Block:  Pectoralis block  Pre-Anesthetic Checklist: ,, timeout performed, Correct Patient, Correct Site, Correct Laterality, Correct Procedure, Correct Position, site marked, Risks and benefits discussed,  Surgical consent,  Pre-op evaluation,  At surgeon's request and post-op pain management  Laterality: Left  Prep: chloraprep       Needles:   Needle Type: Echogenic Needle     Needle Length: 9cm 9 cm Needle Gauge: 21 and 21 G    Additional Needles:  Procedures: ultrasound guided (picture in chart) Pectoralis block Narrative:  Start time: 06/06/2013 10:44 AM End time: 06/06/2013 10:58 AM   Procedure Name: LMA Insertion Date/Time: 06/06/2013 11:06 AM Performed by: Baxter Flattery Pre-anesthesia Checklist: Patient identified, Emergency Drugs available, Suction available and Patient being monitored Patient Re-evaluated:Patient Re-evaluated prior to inductionOxygen Delivery Method: Circle System Utilized Preoxygenation: Pre-oxygenation with 100% oxygen Intubation Type: IV induction Ventilation: Mask ventilation without difficulty LMA: LMA inserted LMA Size: 4.0 Number of attempts: 1 Airway Equipment and Method: bite block Placement Confirmation: positive ETCO2 and breath sounds checked- equal and bilateral Tube secured with: Tape Dental Injury: Teeth and Oropharynx as per pre-operative assessment

## 2013-06-06 NOTE — Progress Notes (Signed)
Nuc med injection done by radiology staff. Versed and fentanyl given IV for sedation at Dr. Trude Mcburney request. Pt tol procedure well, see VS in doc flowsheets

## 2013-06-06 NOTE — Transfer of Care (Signed)
Immediate Anesthesia Transfer of Care Note  Patient: [name] [name]  Procedure(s) Performed: Procedure(s): LEFT BREAST RADIOACTIVE SEED GUIDED LUMPECTOMY AND LEFT AXILLARY SENTINEL LYMPH NODE BIOPSY (Left)  Patient Location: PACU  Anesthesia Type:GA combined with regional for post-op pain  Level of Consciousness: awake, alert , oriented and patient cooperative  Airway & Oxygen Therapy: Patient Spontanous Breathing and Patient connected to face mask oxygen  Post-op Assessment: Report given to PACU RN, Post -op Vital signs reviewed and stable and Patient moving all extremities  Post vital signs: Reviewed and stable  Complications: No apparent anesthesia complications

## 2013-06-06 NOTE — H&P (View-Only) (Signed)
Patient ID: [name] [name], female   DOB: 02/06/1946, 68 y.o.   MRN: [id number]  Chief Complaint  Patient presents with  . Establish Care    Left br ca    HPI [name] [name] is a 68 y.o. female.  Referred by Dr Ulyess Blossom HPI This is a 68 year old female with a family history of breast cancer in a sister in her 31s who presents after undergoing screening mammography. She has breast density category C. She was noted to have a mass in the lower inner quadrant of her left breast with spiculated margins and associated microcalcifications measuring approximately 10 mm. Target ultrasound was performed showing an irregular hypoechoic mass at 9:00 6-7 cm from the nipple that measured 5 x 4 x 5 mm. This corresponds with the mammography findings. No lymphadenopathy is seen in her left axilla. She underwent a core biopsy that shows a grade 2 invasive lobular carcinoma that is estrogen and progesterone receptor positive, HER-2/neu is not amplified, and her proliferation marker is 14%. She describes no complaints referable to either breast. She comes in today to discuss her options. Past Medical History  Diagnosis Date  . Hypertension   . Arthritis   . Cataracts, bilateral   . Depression   . Anxiety   . GERD (gastroesophageal reflux disease)   . Hyperlipidemia   . Rib fracture   . Shingles   . Anemia   . Allergy   . Pre-diabetes   . Obese   . Gallstones     Past Surgical History  Procedure Laterality Date  . Cataract extraction bilateral w/ anterior vitrectomy    . Abdominal hysterectomy    . Cholecystectomy    . Knee arthroscopy Bilateral     Family History  Problem Relation Age of Onset  . Colon polyps Neg Hx   . Esophageal cancer Neg Hx   . Rectal cancer Neg Hx   . Stomach cancer Neg Hx   . Heart attack Mother   . Stroke Mother   . COPD Father   . Hypertension Father     Social History History  Substance Use Topics  . Smoking status: Never Smoker   . Smokeless  tobacco: Never Used  . Alcohol Use: Yes     Comment: social    Allergies  Allergen Reactions  . Requip [Ropinirole Hcl] Shortness Of Breath and Nausea And Vomiting  . Codeine Other (See Comments)    fatigue  . Minocycline   . Morphine Sulfate   . Tetracyclines & Related     Hives  . Zestril [Lisinopril] Cough  . Zetia [Ezetimibe] Hives  . Amoxicillin Rash  . Sulfa Antibiotics Other (See Comments)    headache    Current Outpatient Prescriptions  Medication Sig Dispense Refill  . atorvastatin (LIPITOR) 80 MG tablet Take 80 mg by mouth daily. 1/2 TU TH SUN      . doxazosin (CARDURA) 4 MG tablet Take 4 mg by mouth daily. 1/2 qd      . DULoxetine (CYMBALTA) 30 MG capsule Take 30 mg by mouth 3 (three) times daily.       Marland Kitchen gabapentin (NEURONTIN) 800 MG tablet Take one tablet 3 times daily  90 tablet  0  . HYDROcodone-acetaminophen (NORCO) 5-325 MG per tablet 1/2-1 pill as needed for cough, pain, headache every 6 hours.  60 tablet  0  . omeprazole (PRILOSEC) 40 MG capsule Take 40 mg by mouth daily.      Marland Kitchen oxyCODONE-acetaminophen (ROXICET) 5-325  MG per tablet Take 1 tablet by mouth every 6 (six) hours as needed.  60 tablet  0  . promethazine (PHENERGAN) 25 MG tablet Take 1 tablet (25 mg total) by mouth every 6 (six) hours as needed for nausea or vomiting.  60 tablet  0  . temazepam (RESTORIL) 30 MG capsule Take 30 mg by mouth at bedtime as needed for sleep.      . traMADol (ULTRAM) 50 MG tablet Take 50 mg by mouth 3 (three) times daily.      . verapamil (CALAN-SR) 240 MG CR tablet Take 240 mg by mouth at bedtime.      Marland Kitchen albuterol (PROVENTIL HFA;VENTOLIN HFA) 108 (90 BASE) MCG/ACT inhaler Inhale 2 puffs into the lungs every 6 (six) hours as needed for wheezing or shortness of breath.  1 Inhaler  2  . predniSONE (DELTASONE) 20 MG tablet Take one tablet three times daily with food for 3 days, take one tablet two times daily with food for 3 days, take one tablet daily for 5 days.  20 tablet  0    No current facility-administered medications for this visit.    Review of Systems Review of Systems  Constitutional: Negative for fever, chills and unexpected weight change.  HENT: Negative for congestion, hearing loss, sore throat, trouble swallowing and voice change.   Eyes: Negative for visual disturbance.  Respiratory: Negative for cough and wheezing.   Cardiovascular: Negative for chest pain, palpitations and leg swelling.  Gastrointestinal: Negative for nausea, vomiting, abdominal pain, diarrhea, constipation, blood in stool, abdominal distention and anal bleeding.  Genitourinary: Negative for hematuria, vaginal bleeding and difficulty urinating.  Musculoskeletal: Negative for arthralgias.  Skin: Negative for rash and wound.  Neurological: Negative for seizures, syncope and headaches.  Hematological: Negative for adenopathy. Does not bruise/bleed easily.  Psychiatric/Behavioral: Negative for confusion.    Blood pressure 134/82, pulse 88, temperature 98.9 F (37.2 C), resp. rate 18, height _0  (1.676 m), weight 222 lb (100.699 kg).  Physical Exam Physical Exam  Vitals reviewed. Constitutional: She appears well-developed and well-nourished.  HENT:  Head: Normocephalic and atraumatic.  Eyes: No scleral icterus.  Neck: Neck supple.  Cardiovascular: Normal rate, regular rhythm and normal heart sounds.   Pulmonary/Chest: Effort normal and breath sounds normal. She has no wheezes. She has no rales. Right breast exhibits no inverted nipple, no mass, no nipple discharge, no skin change and no tenderness. Left breast exhibits no inverted nipple, no mass, no nipple discharge, no skin change and no tenderness.  Lymphadenopathy:    She has no cervical adenopathy.    She has no axillary adenopathy.       Right: No supraclavicular adenopathy present.       Left: No supraclavicular adenopathy present.    Data Reviewed EXAM:  DIGITAL DIAGNOSTIC LEFT MAMMOGRAM  ULTRASOUND LEFT  BREAST  COMPARISON: Previous exams.  ACR Breast Density Category c: The breast tissue is heterogeneously  dense, which may obscure small masses.  FINDINGS:  There is a mass in the lower far inner left breast with spiculated  margins and associated microcalcifications measuring approximately  10 mm. This is confirmed on the additional spot compression CC and  MLO views.  Physical examination of the inner left breast does not definitely  reveal any palpable masses.  Targeted ultrasound of the left breast was performed demonstrating  an irregular hypoechoic mass at 9 o'clock 6-7 cm from the nipple  (this was initially described as 4 cm from the nipple, however  appeared more medial on the imaging than initially described)  measuring 0.5 x 0.4 x 0.5 cm. This corresponds with mammography  findings.  No lymphadenopathy is seen in the left axilla.  IMPRESSION:  Suspicious mass in the left breast at 9 o'clock 6-7 cm from the  nipple.    Assessment    Clinical stage I left breast invasive lobular cancer    Plan    Left breast seed guided lumpectomy, left axillary sentinel node biopsy     We discussed the staging and pathophysiology of breast cancer. We discussed all of the different options for treatment for breast cancer including surgery, chemotherapy, radiation therapy, Herceptin, and antiestrogen therapy.   We discussed a sentinel lymph node biopsy as she does not appear to having lymph node involvement right now. We discussed the performance of that with injection of radioactive tracer and possibly blue dye. We discussed that she would have an incision underneath her axillary hairline. We discussed that there is achance of having a positive node with a sentinel lymph node biopsy and we will await the permanent pathology to make any other first further decisions in terms of her treatment. One of these options might be to return to the operating room to perform an axillary lymph node  dissection. We discussed up to a 5% risk lifetime of chronic shoulder pain as well as lymphedema associated with a sentinel lymph node biopsy.  We discussed the options for treatment of the breast cancer which included lumpectomy versus a mastectomy. We discussed the performance of the lumpectomy with a wire placement. We discussed a 5-10% chance of a positive margin requiring reexcision in the operating room. We also discussed that she may need radiation therapy or antiestrogen therapy or both if she undergoes lumpectomy. We discussed the mastectomy and the postoperative care for that as well. We discussed that there is no difference in her survival whether she undergoes lumpectomy with radiation therapy or antiestrogen therapy versus a mastectomy.  We will have her see med onc and rad onc after surgery.  Referrals have been made. We discussed the risks of operation including bleeding, infection, possible reoperation. She understands her further therapy will be based on what her stages at the time of her operation.      Amaad Byers 05/23/2013, 12:23 PM

## 2013-06-06 NOTE — Op Note (Signed)
Preoperative diagnosis: left breast cancer clinical stage I Postoperative diagnosis: same as above Procedure: 1. Left breast radioactive seed guided lumpectomy 2. Left axillary sentinel node biopsy Surgeon: Dr Serita Grammes Anesthesia: general with pectoral block EBL: minimal Complications: none Specimen: 1. Left breast tissue marked with paint, additional inferior, lateral and posterior margins marked short stitch superior, long lateral, double deep 2. Left axillary sentinel nodes with count of 564 Drains: none Sponge count correct at end of case Disposition to recovery in stable condition  Indications: This is a 27 yof who has clinical stage I left breast cancer.  We have decided to proceed with left breast radioactive seed lumpectomy and sentinel node biopsy.  Procedure: After informed consent was obtained the patient first had a radioactive seed placed prior to surgery. She then presented to day surgery. I confirmed the placement of the seed in the preoperative area. She underwent a pectoral block. She was given antibiotics. Sequential compression devices were on her legs. She was placed under general anesthesia with an LMA. She was then prepped and draped in the standard sterile surgical fashion. A surgical timeout was then performed.  I used the neoprobe to identify the location of the radioactive seed. I then made a curvilinear incision overlying this in the medial aspect of the breast. I then used the neoprobe to guide the dissection to remove the seed and the surrounding tissue. A Faxitron mammogram was taken after confirming the seed was in the specimen with the neoprobe. The mammogram showed the clip and the radioactive seed both to be present in the specimen. The specimen had been marked with paint. Radiology confirmed this and we placed pins. This was then taken to pathology. I took additional posterior margin to make this the pectoral muscle.  This was marked as above.  Pathology  thought that the lateral and inferior margins could be close. I excised more of both of these margins and passed them off.   I then obtained hemostasis. I closed the breast tissue a 2-0 Vicryl. I closed the dermis with 3-0 Vicryl and the skin with 4 Monocryl. Dermabond and Steri-Strips were placed.  I then made an axillary incision and dissected through the axillary fascia.  I then located a packet of sentinel nodes and excised these.  There was no background radioactivity.  I then obtained hemostasis.  I closed this with 2-0 vicryl, 3-0 vicryl and 4-0 monocryl.  Steristrips and dressings were placed.  She tolerated this well, was extubated and transferred to recovery stable.

## 2013-06-11 ENCOUNTER — Ambulatory Visit (INDEPENDENT_AMBULATORY_CARE_PROVIDER_SITE_OTHER): Payer: Medicare Other | Admitting: Physician Assistant

## 2013-06-11 ENCOUNTER — Encounter: Payer: Self-pay | Admitting: Physician Assistant

## 2013-06-11 VITALS — BP 132/70 | HR 88 | Temp 97.9°F | Resp 16 | Ht 65.5 in | Wt 218.0 lb

## 2013-06-11 DIAGNOSIS — E785 Hyperlipidemia, unspecified: Secondary | ICD-10-CM | POA: Diagnosis not present

## 2013-06-11 DIAGNOSIS — E559 Vitamin D deficiency, unspecified: Secondary | ICD-10-CM

## 2013-06-11 DIAGNOSIS — F341 Dysthymic disorder: Secondary | ICD-10-CM

## 2013-06-11 DIAGNOSIS — K219 Gastro-esophageal reflux disease without esophagitis: Secondary | ICD-10-CM

## 2013-06-11 DIAGNOSIS — Z79899 Other long term (current) drug therapy: Secondary | ICD-10-CM | POA: Diagnosis not present

## 2013-06-11 DIAGNOSIS — D649 Anemia, unspecified: Secondary | ICD-10-CM

## 2013-06-11 DIAGNOSIS — E669 Obesity, unspecified: Secondary | ICD-10-CM

## 2013-06-11 DIAGNOSIS — R7303 Prediabetes: Secondary | ICD-10-CM

## 2013-06-11 DIAGNOSIS — I447 Left bundle-branch block, unspecified: Secondary | ICD-10-CM

## 2013-06-11 DIAGNOSIS — Z0000 Encounter for general adult medical examination without abnormal findings: Secondary | ICD-10-CM | POA: Diagnosis not present

## 2013-06-11 DIAGNOSIS — E538 Deficiency of other specified B group vitamins: Secondary | ICD-10-CM

## 2013-06-11 DIAGNOSIS — I10 Essential (primary) hypertension: Secondary | ICD-10-CM

## 2013-06-11 DIAGNOSIS — R7309 Other abnormal glucose: Secondary | ICD-10-CM | POA: Diagnosis not present

## 2013-06-11 HISTORY — DX: Obesity, unspecified: E66.9

## 2013-06-11 LAB — HEMOGLOBIN A1C
Hgb A1c MFr Bld: 6.3 % — ABNORMAL HIGH (ref <5.7)
Mean Plasma Glucose: 134 mg/dL — ABNORMAL HIGH (ref <117)

## 2013-06-11 MED ORDER — CYCLOBENZAPRINE HCL 10 MG PO TABS: 10.0000 mg | ORAL_TABLET | Freq: Three times a day (TID) | ORAL | Status: DC | PRN

## 2013-06-11 NOTE — Progress Notes (Signed)
Subjective:   [name] [name] is a 68 y.o. female who presents for Medicare Annual Wellness Visit and complete physical.    Date of last medicare wellness visit is unknown.  Her blood pressure has been controlled at home, today their BP is BP: 132/70 mmHg She does workout. She denies chest pain, shortness of breath, dizziness.  She is on cholesterol medication and denies myalgias. Her cholesterol is not at goal. The cholesterol last visit was:   Lab Results  Component Value Date   CHOL 199 03/28/2013   HDL 51 03/28/2013   LDLCALC 119* 03/28/2013   TRIG 145 03/28/2013   CHOLHDL 3.9 03/28/2013   She has been working on diet and exercise for prediabetes, and denies foot ulcerations, nausea, paresthesia of the feet, polydipsia, polyuria and visual disturbances. Last A1C in the office was:  Lab Results  Component Value Date   HGBA1C 6.0* 03/28/2013   Patient is on Vitamin D supplement.    Names of Other Physician/Practitioners you currently use: 1. [facility]- here for primary care 2. [facility], eye doctor, last visit July 06 2012, and has appointment March 24 3. Dr. Lelon Perla, dentist, last visit has appoint in March and saw 6 months previously  Medical Services you may have received from other than [facility] providers in the past year (date may be approximate) Right breast biopsy for + breast cancer  Medication Review Current Outpatient Prescriptions on File Prior to Visit  Medication Sig Dispense Refill  . albuterol (PROVENTIL HFA;VENTOLIN HFA) 108 (90 BASE) MCG/ACT inhaler Inhale 2 puffs into the lungs every 6 (six) hours as needed for wheezing or shortness of breath.  1 Inhaler  2  . atorvastatin (LIPITOR) 80 MG tablet Take 80 mg by mouth daily. 1/2 TU TH SUN      . cholecalciferol (VITAMIN D) 1000 UNITS tablet Take 1,000 Units by mouth daily. 2      . doxazosin (CARDURA) 4 MG tablet Take 4 mg by mouth daily. 1/2 qd      . DULoxetine  (CYMBALTA) 30 MG capsule Take 30 mg by mouth 3 (three) times daily.       Marland Kitchen gabapentin (NEURONTIN) 800 MG tablet 800 mg at bedtime. Take 1-or 1/2      . HYDROcodone-acetaminophen (NORCO) 10-325 MG per tablet Take 1 tablet by mouth every 6 (six) hours as needed.  20 tablet  0  . HYDROcodone-acetaminophen (NORCO) 5-325 MG per tablet 1/2-1 pill as needed for cough, pain, headache every 6 hours.  60 tablet  0  . meloxicam (MOBIC) 15 MG tablet Take 15 mg by mouth daily.      Marland Kitchen omeprazole (PRILOSEC) 40 MG capsule Take 40 mg by mouth daily.      . promethazine (PHENERGAN) 25 MG tablet Take 1 tablet (25 mg total) by mouth every 6 (six) hours as needed for nausea or vomiting.  60 tablet  0  . temazepam (RESTORIL) 30 MG capsule Take 30 mg by mouth at bedtime as needed for sleep.      . traMADol (ULTRAM) 50 MG tablet Take 50 mg by mouth 3 (three) times daily.      . verapamil (CALAN-SR) 240 MG CR tablet Take 240 mg by mouth at bedtime.       No current facility-administered medications on file prior to visit.    Current Problems (verified) Patient Active Problem List   Diagnosis Date Noted  . Cancer of lower-inner quadrant of left female breast  05/20/2013  . DIARRHEA 06/22/2009  . PANCREATITIS, HX OF 06/22/2009  . ANEMIA 04/01/2008  . PANCREATITIS 03/27/2008  . EPIGASTRIC PAIN 03/27/2008  . DYSLIPIDEMIA 03/13/2008  . ANXIETY DEPRESSION 03/13/2008  . HYPERTENSION 03/13/2008  . LEFT BUNDLE BRANCH BLOCK 03/13/2008  . ALLERGIC RHINITIS 03/13/2008  . GERD 03/13/2008  . Diverticulosis of Colon (without Mention of Hemorrhage) 03/13/2008  . NAUSEA 03/13/2008  . ABDOMINAL PAIN, UNSPECIFIED SITE 03/13/2008  . ABDOMINAL PAIN-MULTIPLE SITES 03/13/2008    Screening Tests Health Maintenance  Topic Date Due  . Influenza Vaccine  11/09/2013  . Mammogram  05/16/2015  . Tetanus/tdap  03/11/2018  . Colonoscopy  03/20/2023  . Pneumococcal Polysaccharide Vaccine Age 37 And Over  Completed  . Zostavax   Completed    Immunization History  Administered Date(s) Administered  . Influenza Whole 01/01/2013  . Pneumococcal Polysaccharide-23 05/23/2011  . Tdap 03/11/2008  . Zoster 02/02/2010    Preventative care: Last colonoscopy: 2014 Last mammogram: 05/06/2013 + right breast cancer Last pap smear/pelvic exam: remote   DEXA: March 2014  Prior vaccinations: TD or Tdap: 2009  Influenza: 12/2012  Pneumococcal: 2013 Shingles/Zostavax: 2011  History reviewed: allergies, current medications, past family history, past medical history, past social history, past surgical history and problem list  Risk Factors: Osteoporosis: postmenopausal estrogen deficiency, dietary calcium and/or vitamin D deficiency and amenorrhea History of fracture in the past year: yes, fractured rib  Tobacco History  Smoking status  . Never Smoker   Smokeless tobacco  . Never Used   She does not smoke.  Patient is not a former smoker. Are there smokers in your home (other than you)?  No  Alcohol Current alcohol use: none  Caffeine Current caffeine use: denies use  Exercise Current exercise habits: The patient does not participate in regular exercise at present.  Current exercise: walking  Nutrition/Diet Current diet: in general, a "healthy" diet    Cardiac risk factors: advanced age (older than 53 for men, 45 for women), dyslipidemia, family history of premature cardiovascular disease, hypertension, obesity (BMI >= 30 kg/m2) and sedentary lifestyle.  Depression Screen Nurse depression screen reviewed due to recent cancer diagnosis however she states she is doing well, denies suicidal ideation and states cymbalta helps.   (Note: if answer to either of the following is "Yes", a more complete depression screening is indicated)   Q1: Over the past two weeks, have you felt down, depressed or hopeless? Yes  Q2: Over the past two weeks, have you felt little interest or pleasure in doing things? Yes  Have  you lost interest or pleasure in daily life? Yes  Do you often feel hopeless? No  Do you cry easily over simple problems? No  Activities of Daily Living Nurse ADLs screen reviewed.  In your present state of health, do you have any difficulty performing the following activities?:  Driving? No Managing money?  No Feeding yourself? No Getting from bed to chair? No Climbing a flight of stairs? No Preparing food and eating?: No Bathing or showering? No Getting dressed: No Getting to the toilet? No Using the toilet:No Moving around from place to place: No In the past year have you fallen or had a near fall?:Yes   Are you sexually active?  No  Do you have more than one partner?  No  Vision Difficulties: No  Hearing Difficulties: No Do you often ask people to speak up or repeat themselves? No Do you experience ringing or noises in your ears? No Do you  have difficulty understanding soft or whispered voices? No  Cognition  Do you feel that you have a problem with memory? No  Do you often misplace items? No  Do you feel safe at home?  Yes  Advanced directives Does patient have a [place]? No Does patient have a Living Will? No    Objective:     Vision and hearing screens reviewed.   Blood pressure 132/70, pulse 88, temperature 97.9 F (36.6 C), resp. rate 16, height 5' 5.5" (1.664 m), weight 218 lb (98.884 kg). Body mass index is 35.71 kg/(m^2).  General appearance: alert, no distress, WD/WN,  female Cognitive Testing  Alert? Yes  Normal Appearance?Yes  Oriented to person? Yes  Place? Yes   Time? Yes  Recall of three objects?  Yes  Can perform simple calculations? Yes  Displays appropriate judgment?Yes  Can read the correct time from a watch face?Yes  HEENT: normocephalic, sclerae anicteric, TMs pearly, nares patent, no discharge or erythema, pharynx normal Oral cavity: MMM, no lesions Neck: supple, no lymphadenopathy, no thyromegaly, no  masses Heart: RRR, normal S1, S2, no murmurs Lungs: CTA bilaterally, no wheezes, rhonchi, or rales Abdomen: +bs, soft, non tender, non distended, no masses, no hepatomegaly, no splenomegaly Musculoskeletal: nontender, no swelling, no obvious deformity Extremities: no edema, no cyanosis, no clubbing Pulses: 2+ symmetric, upper and lower extremities, normal cap refill Neurological: alert, oriented x 3, CN2-12 intact, strength normal upper extremities and lower extremities, sensation normal throughout, DTRs 2+ throughout, no cerebellar signs, gait normal Psychiatric: normal affect, behavior normal, pleasant  Breast: defer being treated for breast cancer Gyn: defer Rectal: defer  Assessment:   1. HYPERTENSION - TSH - Urinalysis, Routine w reflex microscopic - Microalbumin / creatinine urine ratio - [place], RETROPERITNL ABD,  LTD  2. LEFT BUNDLE BRANCH BLOCK  3. GERD  4. DYSLIPIDEMIA - Lipid panel  5. ANEMIA - Iron and TIBC - Ferritin  6. ANXIETY DEPRESSION - at goal with cymbalta  7. Prediabetes - Hemoglobin A1c - Insulin, fasting  8. Unspecified vitamin D deficiency  9. Encounter for long-term (current) use of other medications - Magnesium   Plan:   During the course of the visit the patient was educated and counseled about appropriate screening and preventive services including:    Influenza vaccine  Bone densitometry screening  Colorectal cancer screening  Diabetes screening  Glaucoma screening  Nutrition counseling   Advanced directives: has NO advanced directive  - add't info requested. Referral to SW: yes  Screening recommendations, referrals:  Vaccinations: Tdap vaccine no  Influenza vaccine no Pneumococcal vaccine no Shingles vaccine no Hep B vaccine no  Nutrition assessed and recommended  Colonoscopy no Mammogram no Pap smear no Pelvic exam no Recommended yearly ophthalmology/optometry visit for glaucoma screening and checkup Recommended  yearly dental visit for hygiene and checkup Advanced directives - yes  Conditions/risks identified: BMI: Discussed weight loss, diet, and increase physical activity.  Increase physical activity: AHA recommends 150 minutes of physical activity a week.  Medications reviewed DEXA- requested Prediabetes at goal. Urinary Incontinence is not an issue: discussed non pharmacology and pharmacology options.  Fall risk: low- discussed PT, home fall assessment, medications.   Medicare Attestation I have personally reviewed: The patient's medical and social history Their use of alcohol, tobacco or illicit drugs Their current medications and supplements The patient's functional ability including ADLs,fall risks, home safety risks, cognitive, and hearing and visual impairment Diet and physical activities Evidence for depression or mood disorders  The patient's weight, height, BMI, and visual acuity have been recorded in the chart.  I have made referrals, counseling, and provided education to the patient based on review of the above and I have provided the patient with a written personalized care plan for preventive services.     Vicie Mutters, PA-C   06/11/2013

## 2013-06-11 NOTE — Patient Instructions (Signed)
Preventative Care for Adults - Female      MAINTAIN REGULAR HEALTH EXAMS:  A routine yearly physical is a good way to check in with your primary care provider about your health and preventive screening. It is also an opportunity to share updates about your health and any concerns you have, and receive a thorough all-over exam.   Most health insurance companies pay for at least some preventative services.  Check with your health plan for specific coverages.  WHAT PREVENTATIVE SERVICES DO WOMEN NEED?  Adult women should have their weight and blood pressure checked regularly.   Women age 35 and older should have their cholesterol levels checked regularly.  Women should be screened for cervical cancer with a Pap smear and pelvic exam beginning at either age 21, or 3 years after they become sexually activity.    Breast cancer screening generally begins at age 40 with a mammogram and breast exam by your primary care provider.    Beginning at age 50 and continuing to age 75, women should be screened for colorectal cancer.  Certain people may need continued testing until age 85.  Updating vaccinations is part of preventative care.  Vaccinations help protect against diseases such as the flu.  Osteoporosis is a disease in which the bones lose minerals and strength as we age. Women ages 65 and over should discuss this with their caregivers, as should women after menopause who have other risk factors.  Lab tests are generally done as part of preventative care to screen for anemia and blood disorders, to screen for problems with the kidneys and liver, to screen for bladder problems, to check blood sugar, and to check your cholesterol level.  Preventative services generally include counseling about diet, exercise, avoiding tobacco, drugs, excessive alcohol consumption, and sexually transmitted infections.    GENERAL RECOMMENDATIONS FOR GOOD HEALTH:  Healthy diet:  Eat a variety of foods, including  fruit, vegetables, animal or vegetable protein, such as meat, fish, chicken, and eggs, or beans, lentils, tofu, and grains, such as rice.  Drink plenty of water daily.  Decrease saturated fat in the diet, avoid lots of red meat, processed foods, sweets, fast foods, and fried foods.  Exercise:  Aerobic exercise helps maintain good heart health. At least 30-40 minutes of moderate-intensity exercise is recommended. For example, a brisk walk that increases your heart rate and breathing. This should be done on most days of the week.   Find a type of exercise or a variety of exercises that you enjoy so that it becomes a part of your daily life.  Examples are running, walking, swimming, water aerobics, and biking.  For motivation and support, explore group exercise such as aerobic class, spin class, Zumba, Yoga,or  martial arts, etc.    Set exercise goals for yourself, such as a certain weight goal, walk or run in a race such as a 5k walk/run.  Speak to your primary care provider about exercise goals.  Disease prevention:  If you smoke or chew tobacco, find out from your caregiver how to quit. It can literally save your life, no matter how long you have been a tobacco user. If you do not use tobacco, never begin.   Maintain a healthy diet and normal weight. Increased weight leads to problems with blood pressure and diabetes.   The Body Mass Index or BMI is a way of measuring how much of your body is fat. Having a BMI above 27 increases the risk of heart disease,   diabetes, hypertension, stroke and other problems related to obesity. Your caregiver can help determine your BMI and based on it develop an exercise and dietary program to help you achieve or maintain this important measurement at a healthful level.  High blood pressure causes heart and blood vessel problems.  Persistent high blood pressure should be treated with medicine if weight loss and exercise do not work.   Fat and cholesterol leaves  deposits in your arteries that can block them. This causes heart disease and vessel disease elsewhere in your body.  If your cholesterol is found to be high, or if you have heart disease or certain other medical conditions, then you may need to have your cholesterol monitored frequently and be treated with medication.   Ask if you should have a cardiac stress test if your history suggests this. A stress test is a test done on a treadmill that looks for heart disease. This test can find disease prior to there being a problem.  Menopause can be associated with physical symptoms and risks. Hormone replacement therapy is available to decrease these. You should talk to your caregiver about whether starting or continuing to take hormones is right for you.   Osteoporosis is a disease in which the bones lose minerals and strength as we age. This can result in serious bone fractures. Risk of osteoporosis can be identified using a bone density scan. Women ages 65 and over should discuss this with their caregivers, as should women after menopause who have other risk factors. Ask your caregiver whether you should be taking a calcium supplement and Vitamin D, to reduce the rate of osteoporosis.   Avoid drinking alcohol in excess (more than two drinks per day).  Avoid use of street drugs. Do not share needles with anyone. Ask for professional help if you need assistance or instructions on stopping the use of alcohol, cigarettes, and/or drugs.  Brush your teeth twice a day with fluoride toothpaste, and floss once a day. Good oral hygiene prevents tooth decay and gum disease. The problems can be painful, unattractive, and can cause other health problems. Visit your dentist for a routine oral and dental check up and preventive care every 6-12 months.   Look at your skin regularly.  Use a mirror to look at your back. Notify your caregivers of changes in moles, especially if there are changes in shapes, colors, a size  larger than a pencil eraser, an irregular border, or development of new moles.  Safety:  Use seatbelts 100% of the time, whether driving or as a passenger.  Use safety devices such as hearing protection if you work in environments with loud noise or significant background noise.  Use safety glasses when doing any work that could send debris in to the eyes.  Use a helmet if you ride a bike or motorcycle.  Use appropriate safety gear for contact sports.  Talk to your caregiver about gun safety.  Use sunscreen with a SPF (or skin protection factor) of 15 or greater.  Lighter skinned people are at a greater risk of skin cancer. Don't forget to also wear sunglasses in order to protect your eyes from too much damaging sunlight. Damaging sunlight can accelerate cataract formation.   Practice safe sex. Use condoms. Condoms are used for birth control and to help reduce the spread of sexually transmitted infections (or STIs).  Some of the STIs are gonorrhea (the clap), chlamydia, syphilis, trichomonas, herpes, HPV (human papilloma virus) and HIV (human immunodeficiency virus)   which causes AIDS. The herpes, HIV and HPV are viral illnesses that have no cure. These can result in disability, cancer and death.   Keep carbon monoxide and smoke detectors in your home functioning at all times. Change the batteries every 6 months or use a model that plugs into the wall.   Vaccinations:  Stay up to date with your tetanus shots and other required immunizations. You should have a booster for tetanus every 10 years. Be sure to get your flu shot every year, since 5%-20% of the [place] population comes down with the flu. The flu vaccine changes each year, so being vaccinated once is not enough. Get your shot in the fall, before the flu season peaks.   Other vaccines to consider:  Human Papilloma Virus or HPV causes cancer of the cervix, and other infections that can be transmitted from person to person. There is a vaccine for  HPV, and females should get immunized between the ages of 75 and 33. It requires a series of 3 shots.   Pneumococcal vaccine to protect against certain types of pneumonia.  This is normally recommended for adults age 4 or older.  However, adults younger than 68 years old with certain underlying conditions such as diabetes, heart or lung disease should also receive the vaccine.  Shingles vaccine to protect against Varicella Zoster if you are older than age 67, or younger than 68 years old with certain underlying illness.  Hepatitis A vaccine to protect against a form of infection of the liver by a virus acquired from food.  Hepatitis B vaccine to protect against a form of infection of the liver by a virus acquired from blood or body fluids, particularly if you work in health care.  If you plan to travel internationally, check with your local health department for specific vaccination recommendations.  Cancer Screening:  Breast cancer screening is essential to preventive care for women. All women age 38 and older should perform a breast self-exam every month. At age 59 and older, women should have their caregiver complete a breast exam each year. Women at ages 23 and older should have a mammogram (x-ray film) of the breasts. Your caregiver can discuss how often you need mammograms.    Cervical cancer screening includes taking a Pap smear (sample of cells examined under a microscope) from the cervix (end of the uterus). It also includes testing for HPV (Human Papilloma Virus, which can cause cervical cancer). Screening and a pelvic exam should begin at age 56, or 3 years after a woman becomes sexually active. Screening should occur every year, with a Pap smear but no HPV testing, up to age 65. After age 34, you should have a Pap smear every 3 years with HPV testing, if no HPV was found previously.   Most routine colon cancer screening begins at the age of 74. On a yearly basis, doctors may provide  special easy to use take-home tests to check for hidden blood in the stool. Sigmoidoscopy or colonoscopy can detect the earliest forms of colon cancer and is life saving. These tests use a small camera at the end of a tube to directly examine the colon. Speak to your caregiver about this at age 81, when routine screening begins (and is repeated every 5 years unless early forms of pre-cancerous polyps or small growths are found).    What is the TMJ? The temporomandibular (tem-PUH-ro-man-DIB-yoo-ler) joint, or the TMJ, connects the upper and lower jawbones. This joint allows the jaw  to open wide and move back and forth when you chew, talk, or yawn.There are also several muscles that help this joint move. There can be muscle tightness and pain in the muscle that can cause several symptoms.  What causes TMJ pain? There are many causes of TMJ pain. Repeated chewing (for example, chewing gum) and clenching your teeth can cause pain in the joint. Some TMJ pain has no obvious cause. What can I do to ease the pain? There are many things you can do to help your pain get better. When you have pain:  Eat soft foods and stay away from chewy foods (for example, taffy) Try to use both sides of your mouth to chew Don't chew gum Don't open your mouth wide (for example, during yawning or singing) Don't bite your cheeks or fingernails Lower your amount of stress and worry Applying a warm, damp washcloth to the joint may help. Over-the-counter pain medicines such as ibuprofen (one brand: Advil) or acetaminophen (one brand: Tylenol) might also help. Do not use these medicines if you are allergic to them or if your doctor told you not to use them. How can I stop the pain from coming back? When your pain is better, you can do these exercises to make your muscles stronger and to keep the pain from coming back:  Resisted mouth opening: Place your thumb or two fingers under your chin and open your mouth slowly, pushing up  lightly on your chin with your thumb. Hold for three to six seconds. Close your mouth slowly. Resisted mouth closing: Place your thumbs under your chin and your two index fingers on the ridge between your mouth and the bottom of your chin. Push down lightly on your chin as you close your mouth. Tongue up: Slowly open and close your mouth while keeping the tongue touching the roof of the mouth. Side-to-side jaw movement: Place an object about one fourth of an inch thick (for example, two tongue depressors) between your front teeth. Slowly move your jaw from side to side. Increase the thickness of the object as the exercise becomes easier Forward jaw movement: Place an object about one fourth of an inch thick between your front teeth and move the bottom jaw forward so that the bottom teeth are in front of the top teeth. Increase the thickness of the object as the exercise becomes easier. These exercises should not be painful. If it hurts to do these exercises, stop doing them and talk to your family doctor.

## 2013-06-12 ENCOUNTER — Telehealth (INDEPENDENT_AMBULATORY_CARE_PROVIDER_SITE_OTHER): Payer: Self-pay | Admitting: General Surgery

## 2013-06-12 LAB — IRON AND TIBC
%SAT: 6 % — ABNORMAL LOW (ref 20–55)
IRON: 25 ug/dL — AB (ref 42–145)
TIBC: 415 ug/dL (ref 250–470)
UIBC: 390 ug/dL (ref 125–400)

## 2013-06-12 LAB — URINALYSIS, ROUTINE W REFLEX MICROSCOPIC
BILIRUBIN URINE: NEGATIVE
Glucose, UA: NEGATIVE mg/dL
Hgb urine dipstick: NEGATIVE
KETONES UR: NEGATIVE mg/dL
Leukocytes, UA: NEGATIVE
Nitrite: NEGATIVE
PH: 7 (ref 5.0–8.0)
Protein, ur: NEGATIVE mg/dL
SPECIFIC GRAVITY, URINE: 1.009 (ref 1.005–1.030)
Urobilinogen, UA: 0.2 mg/dL (ref 0.0–1.0)

## 2013-06-12 LAB — LIPID PANEL
Cholesterol: 215 mg/dL — ABNORMAL HIGH (ref 0–200)
HDL: 47 mg/dL (ref >39)
LDL CALC: 129 mg/dL — AB (ref 0–99)
TRIGLYCERIDES: 193 mg/dL — AB (ref <150)
Total CHOL/HDL Ratio: 4.6 Ratio
VLDL: 39 mg/dL (ref 0–40)

## 2013-06-12 LAB — MICROALBUMIN / CREATININE URINE RATIO
CREATININE, URINE: 82.2 mg/dL
MICROALB/CREAT RATIO: 6.1 mg/g (ref 0.0–30.0)
Microalb, Ur: 0.5 mg/dL (ref 0.00–1.89)

## 2013-06-12 LAB — FERRITIN: Ferritin: 17 ng/mL (ref 10–291)

## 2013-06-12 LAB — MAGNESIUM: Magnesium: 1.8 mg/dL (ref 1.5–2.5)

## 2013-06-12 LAB — INSULIN, FASTING: Insulin fasting, serum: 15 u[IU]/mL (ref 3–28)

## 2013-06-12 LAB — TSH: TSH: 1.951 u[IU]/mL (ref 0.350–4.500)

## 2013-06-12 NOTE — Telephone Encounter (Signed)
Attempted to call patient with pathology results

## 2013-06-14 ENCOUNTER — Other Ambulatory Visit: Payer: Self-pay | Admitting: *Deleted

## 2013-06-14 DIAGNOSIS — C50312 Malignant neoplasm of lower-inner quadrant of left female breast: Secondary | ICD-10-CM

## 2013-06-17 ENCOUNTER — Encounter: Payer: Self-pay | Admitting: *Deleted

## 2013-06-17 NOTE — Progress Notes (Signed)
Completed chart, labs entered, added to spreadsheet and placed in Dr. Khan's box.  

## 2013-06-19 ENCOUNTER — Encounter (INDEPENDENT_AMBULATORY_CARE_PROVIDER_SITE_OTHER): Payer: Self-pay | Admitting: General Surgery

## 2013-06-19 ENCOUNTER — Encounter (INDEPENDENT_AMBULATORY_CARE_PROVIDER_SITE_OTHER): Payer: Self-pay

## 2013-06-19 ENCOUNTER — Ambulatory Visit (INDEPENDENT_AMBULATORY_CARE_PROVIDER_SITE_OTHER): Payer: Medicare Other | Admitting: General Surgery

## 2013-06-19 VITALS — BP 134/80 | HR 78 | Temp 98.8°F | Resp 14 | Ht 66.0 in | Wt 222.0 lb

## 2013-06-19 DIAGNOSIS — Z09 Encounter for follow-up examination after completed treatment for conditions other than malignant neoplasm: Secondary | ICD-10-CM

## 2013-06-19 NOTE — Progress Notes (Signed)
Subjective:     Patient ID: [name] [name], female   DOB: 1945/06/29, 68 y.o.   MRN: [id number]  HPI This is a 68 year old female Who recently underwent a left breast radioactive seed guided lumpectomy and axillary sentinel lymph node biopsy. This is a 1.8 cm grade 1 invasive lobular carcinoma clear margins and a negative sentinel lymph node. This is hormone receptor positive and HER-2/neu is not amplified. She has some mild swelling in her left axilla but is otherwise doing fine. She comes back in today postoperatively.  Review of Systems     Objective:   Physical Exam  Pulmonary/Chest:         Assessment:     Stage I invasive lobular cancer status post lumpectomy and sentinel node biopsy     Plan:     She is doing very well. She's going to increase her activity. I asked her to massage her incisions with vitamin E. I gave her a list of exercises and a referral to the after breast cancer physical therapy class. I will place her next followup on her upcoming radiation and medical oncology appointments. I provided her a copy of her pathology today. I also told her if the seroma increases in size to call me back sooner

## 2013-06-19 NOTE — Patient Instructions (Signed)
      [facility] After Breast Cancer Class  After Breast Cancer Class is a specially designed exercise class to assist you in a safe recovery after having breast cancer surgery.  In this class you will learn how to get back to full function whether your drains were just removed or if you had surgery a month ago.  This one-time class is held the 1st and 3rd Monday of every month from 11:00 a.m. until 12:00 noon at the [place] located at [facility].  This class is FREE and space is limited.  For more information or to register for the next available class, call [phone].  Class Goals   Understand specific stretches to improve the flexibility of your chest and shoulder.   Learn ways to safely strengthen your upper body and improve your posture.   Understand the warning signs of infection and why you may be at risk for an arm infection.   Learn about Lymphedema and prevention.  **You do not attend this class until after surgery.  Drains must be removed to participate.     Serafina Royals, PT, CLT Leone Payor, PT, CLT

## 2013-06-21 ENCOUNTER — Encounter: Payer: Self-pay | Admitting: *Deleted

## 2013-06-21 ENCOUNTER — Telehealth: Payer: Self-pay | Admitting: Oncology

## 2013-06-21 ENCOUNTER — Ambulatory Visit (HOSPITAL_BASED_OUTPATIENT_CLINIC_OR_DEPARTMENT_OTHER): Payer: Medicare Other | Admitting: Oncology

## 2013-06-21 ENCOUNTER — Encounter (INDEPENDENT_AMBULATORY_CARE_PROVIDER_SITE_OTHER): Payer: Self-pay

## 2013-06-21 ENCOUNTER — Encounter: Payer: Self-pay | Admitting: Oncology

## 2013-06-21 ENCOUNTER — Ambulatory Visit: Payer: Medicare Other

## 2013-06-21 ENCOUNTER — Other Ambulatory Visit (HOSPITAL_BASED_OUTPATIENT_CLINIC_OR_DEPARTMENT_OTHER): Payer: Medicare Other

## 2013-06-21 VITALS — BP 152/82 | HR 102 | Temp 98.7°F | Resp 20 | Ht 66.0 in | Wt 203.3 lb

## 2013-06-21 DIAGNOSIS — C50319 Malignant neoplasm of lower-inner quadrant of unspecified female breast: Secondary | ICD-10-CM

## 2013-06-21 DIAGNOSIS — Z170 Estrogen receptor positive status [ER+]: Secondary | ICD-10-CM | POA: Diagnosis not present

## 2013-06-21 DIAGNOSIS — C50312 Malignant neoplasm of lower-inner quadrant of left female breast: Secondary | ICD-10-CM

## 2013-06-21 DIAGNOSIS — D649 Anemia, unspecified: Secondary | ICD-10-CM | POA: Diagnosis not present

## 2013-06-21 LAB — CBC WITH DIFFERENTIAL/PLATELET
BASO%: 2.9 % — AB (ref 0.0–2.0)
Basophils Absolute: 0.2 10*3/uL — ABNORMAL HIGH (ref 0.0–0.1)
EOS%: 6.9 % (ref 0.0–7.0)
Eosinophils Absolute: 0.5 10*3/uL (ref 0.0–0.5)
HCT: 31.3 % — ABNORMAL LOW (ref 34.8–46.6)
HGB: 10.3 g/dL — ABNORMAL LOW (ref 11.6–15.9)
LYMPH%: 22.2 % (ref 14.0–49.7)
MCH: 27.1 pg (ref 25.1–34.0)
MCHC: 32.9 g/dL (ref 31.5–36.0)
MCV: 82.4 fL (ref 79.5–101.0)
MONO#: 0.7 10*3/uL (ref 0.1–0.9)
MONO%: 9.2 % (ref 0.0–14.0)
NEUT%: 58.8 % (ref 38.4–76.8)
NEUTROS ABS: 4.3 10*3/uL (ref 1.5–6.5)
Platelets: 274 10*3/uL (ref 145–400)
RBC: 3.8 10*6/uL (ref 3.70–5.45)
RDW: 16.1 % — AB (ref 11.2–14.5)
WBC: 7.3 10*3/uL (ref 3.9–10.3)
lymph#: 1.6 10*3/uL (ref 0.9–3.3)

## 2013-06-21 LAB — COMPREHENSIVE METABOLIC PANEL (CC13)
ALK PHOS: 108 U/L (ref 40–150)
ALT: 15 U/L (ref 0–55)
AST: 14 U/L (ref 5–34)
Albumin: 3.5 g/dL (ref 3.5–5.0)
Anion Gap: 11 mEq/L (ref 3–11)
BUN: 17.9 mg/dL (ref 7.0–26.0)
CO2: 22 mEq/L (ref 22–29)
Calcium: 9.1 mg/dL (ref 8.4–10.4)
Chloride: 108 mEq/L (ref 98–109)
Creatinine: 0.8 mg/dL (ref 0.6–1.1)
Glucose: 126 mg/dl (ref 70–140)
POTASSIUM: 3.8 meq/L (ref 3.5–5.1)
SODIUM: 141 meq/L (ref 136–145)
TOTAL PROTEIN: 6.9 g/dL (ref 6.4–8.3)
Total Bilirubin: 0.33 mg/dL (ref 0.20–1.20)

## 2013-06-21 NOTE — [facility] Oncology Navigator Note (Signed)
Oncotype Dx ordered as per request by Dr. Humphrey Rolls.  Faxed to Pathology and receipt confirmed by [facility].

## 2013-06-21 NOTE — Progress Notes (Signed)
[name] [name] [id number] [birth year] 68 y.o. 06/21/2013 1:25 PM  CC  MCKEOWN,WILLIAM DAVID, MD [place] [address] Dr. Rolm Bookbinder Dr. Eppie Gibson  REASON FOR CONSULTATION:  68 year old female with new diagnosis of left breast cancer (T1 N0)  STAGE:  Cancer of lower-inner quadrant of left female breast   Primary site: Breast (Left)   Staging method: AJCC 7th Edition   Pathologic: Stage IA (T1c, N0, cM0) signed by Deatra Robinson, MD on 06/27/2013 12:21 AM   Summary: Stage IA (T1c, N0, cM0)    REFERRING PHYSICIAN: Dr. Rolm Bookbinder  HISTORY OF PRESENT ILLNESS:  [name] [name] is a 68 y.o. female the past medical history significant for anxiety GERD shingles anemia and hypertension. Patient also has a family history significant for breast cancer in a sister. Patient underwent a screening mammogram that showed a breast density category C. She was noted to have a mass in the lower inner quadrant of the left breast with spiculated margins and associated microcalcifications. This measures 10 mm. She had an ultrasound performed that showed irregular hypoechoic mass at the 9:00 position 67 cm from the nipple. This measured 5 x 4 x 5 mm. She had a core needle biopsy performed that revealed invasive lobular carcinoma, grade 2, tumor was estrogen receptor positive progesterone receptor positive HER-2/neu negative with a proliferation marker Ki-67 14%. Patient was seen by Dr. Rolm Bookbinder on 05/22/2013 to discuss surgical treatment options. Since then she underwent a left breast radioactive seed guided lumpectomy and axillary sentinel lymph node biopsy. Her final pathology did reveal a 1.8 cm, grade 1, invasive lobular carcinoma Sentinel node was negative for metastatic disease all margins were clear. Tumor again was ER positive PR positive HER-2/neu negative. She overall tolerated the procedure well. She is now seen in medical oncology for  discussion of adjuvant treatment options. Postoperatively she is doing well other than some mild left axilla swelling. She does have some tenderness at the surgical site as well. But she does tell me that she feels she has done remarkably well.   Past Medical History: Past Medical History  Diagnosis Date  . Hypertension   . Arthritis   . Cataracts, bilateral   . Depression   . Anxiety   . GERD (gastroesophageal reflux disease)   . Hyperlipidemia   . Rib fracture   . Shingles   . Anemia   . Allergy   . Pre-diabetes   . Obese   . Gallstones   . Wears glasses   . PONV (postoperative nausea and vomiting)     Past Surgical History: Past Surgical History  Procedure Laterality Date  . Cataract extraction bilateral w/ anterior vitrectomy    . Abdominal hysterectomy    . Cholecystectomy    . Knee arthroscopy Bilateral   . Tonsillectomy    . Colonoscopy    . Upper gi endoscopy    . Breast surgery      lt lump-neg  . Dupuytren / palmar fasciotomy  2007    rt    Family History: Family History  Problem Relation Age of Onset  . Colon polyps Neg Hx   . Esophageal cancer Neg Hx   . Rectal cancer Neg Hx   . Stomach cancer Neg Hx   . Heart attack Mother   . Stroke Mother   . COPD Father   . Hypertension Father     Social History History  Substance Use Topics  . Smoking status: Never  Smoker   . Smokeless tobacco: Never Used  . Alcohol Use: Yes     Comment: social    Allergies: Allergies  Allergen Reactions  . Requip [Ropinirole Hcl] Shortness Of Breath and Nausea And Vomiting  . Codeine Other (See Comments)    fatigue  . Minocycline   . Morphine Sulfate   . Tetracyclines & Related     Hives  . Zestril [Lisinopril] Cough  . Zetia [Ezetimibe] Hives  . Amoxicillin Rash  . Sulfa Antibiotics Other (See Comments)    headache    Current Medications: Current Outpatient Prescriptions  Medication Sig Dispense Refill  . albuterol (PROVENTIL HFA;VENTOLIN HFA) 108  (90 BASE) MCG/ACT inhaler Inhale 2 puffs into the lungs every 6 (six) hours as needed for wheezing or shortness of breath.  1 Inhaler  2  . atorvastatin (LIPITOR) 80 MG tablet Take 80 mg by mouth daily. 1/2 TU TH SUN      . cholecalciferol (VITAMIN D) 1000 UNITS tablet Take 1,000 Units by mouth daily. 2      . cyclobenzaprine (FLEXERIL) 10 MG tablet Take 1 tablet (10 mg total) by mouth every 8 (eight) hours as needed for muscle spasms.  30 tablet  1  . doxazosin (CARDURA) 4 MG tablet Take 4 mg by mouth daily. 1/2 qd      . DULoxetine (CYMBALTA) 30 MG capsule Take 30 mg by mouth 3 (three) times daily.       Marland Kitchen gabapentin (NEURONTIN) 800 MG tablet 800 mg at bedtime. Take 1-or 1/2      . HYDROcodone-acetaminophen (NORCO) 10-325 MG per tablet Take 1 tablet by mouth every 6 (six) hours as needed.  20 tablet  0  . HYDROcodone-acetaminophen (NORCO) 5-325 MG per tablet 1/2-1 pill as needed for cough, pain, headache every 6 hours.  60 tablet  0  . meloxicam (MOBIC) 15 MG tablet Take 15 mg by mouth daily.      Marland Kitchen omeprazole (PRILOSEC) 40 MG capsule Take 40 mg by mouth daily.      . promethazine (PHENERGAN) 25 MG tablet Take 1 tablet (25 mg total) by mouth every 6 (six) hours as needed for nausea or vomiting.  60 tablet  0  . temazepam (RESTORIL) 30 MG capsule Take 30 mg by mouth at bedtime as needed for sleep.      . traMADol (ULTRAM) 50 MG tablet Take 50 mg by mouth 3 (three) times daily.      . verapamil (CALAN-SR) 240 MG CR tablet Take 240 mg by mouth at bedtime.       No current facility-administered medications for this visit.    OB/GYN History: menarche at 36, menopause, HRT for 15 years, discontinue 2000, GxP1 at 52  Fertility Discussion: n/a Prior History of Cancer: no  Health Maintenance:  Colonoscopy yes Bone Density yes Last PAP smear n/a  ECOG PERFORMANCE STATUS: 0 - Asymptomatic  Genetic Counseling/testing:no  REVIEW OF SYSTEMS:  A comprehensive review of systems was negative.  except for some back pain and arthritic type of pains and aches and headaches. She does have some chronic sinus trouble. Remainder of the 14 point review of systems was normal and it is scant separately into the electronic medical record  PHYSICAL EXAMINATION: Blood pressure 152/82, pulse 102, temperature 98.7 F (37.1 C), temperature source Oral, resp. rate 20, height 5' 6"  (1.676 m), weight 203 lb 4.8 oz (92.216 kg).  General:  well-nourished in no acute distress.  Eyes:  no scleral icterus.  ENT:  There were no oropharyngeal lesions.  Neck was without thyromegaly.  Lymphatics:  Negative cervical, supraclavicular or axillary adenopathy.  Respiratory: lungs were clear bilaterally without wheezing or crackles.  Cardiovascular:  Regular rate and rhythm, S1/S2, without murmur, rub or gallop.  There was no pedal edema.  GI:  abdomen was soft, flat, nontender, nondistended, without organomegaly.  Muscoloskeletal:  no spinal tenderness of palpation of vertebral spine.  Skin exam was without echymosis, petichae.  Neuro exam was nonfocal.  Patient was able to get on and off exam table without assistance.  Gait was normal.  Patient was alerted and oriented.  Attention was good.   Language was appropriate.  Mood was normal without depression.  Speech was not pressured.  Thought content was not tangential.   Breasts: right breast normal without mass, skin or nipple changes or axillary nodes, left breast normal without mass, skin or nipple changes or axillary nodes with healing surgical scar and some fullness in the axilla..   STUDIES/RESULTS: Nm Sentinel Node Inj-no Rpt (breast)  06/06/2013   CLINICAL DATA: left axillary snbx   Sulfur colloid was injected intradermally by the nuclear medicine  technologist for breast cancer sentinel node localization.    US Breast Surgical Specimen  06/06/2013   CLINICAL DATA:  Status post lumpectomy following seed localization.  EXAM: SPECIMEN RADIOGRAPH OF THE left BREAST   COMPARISON:  Previous exam(s)  FINDINGS: Status post excision of the left breast. Radioactive seed is present and is marked for pathology. The previously placed clip is also localized for pathology.  IMPRESSION: Specimen radiograph of the left breast.  No apparent complications   Electronically Signed   By: Shon Hale M.D.   On: 06/06/2013 14:13   Mm Lt Plc Breast Loc Dev   1st Lesion  Inc Mammo Guide  06/04/2013   CLINICAL DATA:  Patient presents for seed localization prior to left lumpectomy.  EXAM: MAMMOGRAPHIC GUIDED RADIOACTIVE SEED LOCALIZATION OF THE left BREAST  COMPARISON:  Previous exam(s)  FINDINGS: Patient presents for radioactive seed localization prior to lumpectomy. I met with the patient and we discussed the procedure of seed localization including benefits and alternatives. We discussed the high likelihood of a successful procedure. We discussed the risks of the procedure including infection, bleeding, tissue injury and further surgery. We discussed the low dose of radioactivity involved in the procedure. Informed, written consent was given.  The usual time-out protocol was performed immediately prior to the procedure.  Using mammographic guidance, sterile technique, 2% lidocaine and an I-125 radioactive seed, mass and clip in the medial portion of the left breast are localized using a medial approach. The follow-up mammogram images confirm the seed in the expected location and are marked for Dr. Donne Hazel.  Follow-up survey of the patient confirms presence of radioactive seed.  Order number of I-125 seed:  [id number].  Dose of I-125 seed:  0.25 mCi.  The patient tolerated the procedure well and was [facility]. She was given instructions regarding seed removal. Surgery is scheduled for 06/06/2013.  IMPRESSION: Radioactive seed localization of the left breast. No apparent complications.   Electronically Signed   By: Shon Hale M.D.   On: 06/04/2013 14:43     LABS:     Chemistry      Component Value Date/Time   NA 143 05/31/2013 1427   K 3.7 05/31/2013 1427   CL 102 05/31/2013 1427   CO2 28 05/31/2013 1427   BUN 18 05/31/2013 1427  CREATININE 0.78 05/31/2013 1427   CREATININE 0.83 03/28/2013 1425      Component Value Date/Time   CALCIUM 9.5 05/31/2013 1427   ALKPHOS 111 05/31/2013 1427   AST 15 05/31/2013 1427   ALT 14 05/31/2013 1427   BILITOT <0.2* 05/31/2013 1427      Lab Results  Component Value Date   WBC 7.3 06/21/2013   HGB 10.3* 06/21/2013   HCT 31.3* 06/21/2013   MCV 82.4 06/21/2013   PLT 274 06/21/2013     ASSESSMENT/PLAN    68 year old female with  #1 stage I (T1 N0) invasive lobular carcinoma of the left breast diagnosed on a routine screening mammogram. Patient is now status post lumpectomy with sentinel lymph node biopsy performed on 06/06/2013. The final pathology revealed 1.8 cm grade 1 invasive lobular carcinoma Sentinel node was negative. Tumor was ER positive PR positive HER-2/neu negative with a proliferation marker Ki-67 at 14%.  #2 We spent the better part of today's hour-long appointment discussing the biology of breast cancer in general, and the specifics of the patient's tumor in particular. We discussed the pathology and radiology in detail today. We also discussed the pathophysiology of patient's breast cancer. We discussed the prognostic markers. We discussed the multidisciplinary approach to treatment of breast cancer including surgery radiation oncology and medical oncology. We discussed the patient's tumor being estrogen receptor positive and progesterone receptor positive so therefore she would be eligible for antiestrogen therapy with tamoxifen or one of the aromatase inhibitors. We discussed risks and benefits and side effects of these agents. We discussed role of chemotherapy in the treatment of breast cancer. Patient understands that she may or may not receive chemotherapy based on her final pathology. We discussed Oncotype  DX testing. I think this patient would be a good candidate for Oncotype DX testing per NCCN guidelines.  #3 she also will need adjuvant radiation therapy and she has an appointment set up with Dr. Eppie Gibson.  #4. Anemia: unclear etiology, could be related to recent surgery she will need a work up eventually if her hemoglobin does not improve. At present we will observe. She is asymptomatic.  5. We will see her back in 3-4 weeks time to discuss the results of Oncotype Dx.   Thank you so much for allowing me to participate in the care of [name] [name]. I will continue to follow up the patient with you and assist in her care.  All questions were answered. The patient knows to call the clinic with any problems, questions or concerns. We can certainly see the patient much sooner if necessary.  I spent 40 minutes counseling the patient face to face. The total time spent in the appointment was 60 minutes.  Marcy Panning, MD Medical/Oncology [facility] [phone] (beeper) [phone] (Office)  06/21/2013, 1:25 PM

## 2013-06-21 NOTE — Progress Notes (Signed)
Checked in new pt with no financial concerns. °

## 2013-06-21 NOTE — Telephone Encounter (Signed)
, °

## 2013-06-26 ENCOUNTER — Other Ambulatory Visit: Payer: Self-pay | Admitting: Radiation Oncology

## 2013-06-26 ENCOUNTER — Encounter: Payer: Self-pay | Admitting: Radiation Oncology

## 2013-06-26 ENCOUNTER — Ambulatory Visit
Admission: RE | Admit: 2013-06-26 | Discharge: 2013-06-26 | Disposition: A | Discharge status: Home / Self Care | Payer: Medicare Other | Source: Ambulatory Visit | Attending: Radiation Oncology | Admitting: Radiation Oncology

## 2013-06-26 VITALS — BP 133/71 | HR 80 | Temp 98.3°F | Ht 66.0 in | Wt 227.3 lb

## 2013-06-26 DIAGNOSIS — C50312 Malignant neoplasm of lower-inner quadrant of left female breast: Secondary | ICD-10-CM

## 2013-06-26 DIAGNOSIS — N63 Unspecified lump in unspecified breast: Secondary | ICD-10-CM

## 2013-06-26 DIAGNOSIS — C50919 Malignant neoplasm of unspecified site of unspecified female breast: Secondary | ICD-10-CM | POA: Insufficient documentation

## 2013-06-26 DIAGNOSIS — C50319 Malignant neoplasm of lower-inner quadrant of unspecified female breast: Secondary | ICD-10-CM | POA: Diagnosis not present

## 2013-06-26 HISTORY — DX: Malignant neoplasm of unspecified site of unspecified female breast: C50.919

## 2013-06-26 NOTE — Progress Notes (Signed)
Radiation Oncology         [phone] ________________________________  Initial outpatient Consultation  Name: [name] [name] MRN: [id number]  Date: 06/26/2013  DOB: [birth year]  KG:URKYHCW,CBJSEGB DAVID, MD  Rolm Bookbinder, MD   REFERRING PHYSICIAN: Rolm Bookbinder, MD  DIAGNOSIS: pT1cN0, clinical M0 stage I invasive lobular carcinoma of the left breast, grade 1, ER/PR positive HER-2/neu negative  HISTORY OF PRESENT ILLNESS::[name] [name] is a 68 y.o. female who presented on screening mammography to have an abnormality in the lower inner quadrant of her left breast. The lesion measured 5 mm on ultrasound. This was biopsied, revealing invasive mammary carcinoma. MRI of her breasts was not recommended. She underwent lumpectomy with sentinel lymph node biopsy on 06/06/2013. Her single sentinel node was negative. The invasive lobular carcinoma, identified in the primary lesion, spanned 1.8 cm. Her resection margins are negative.  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 She is otherwise in her usual state of health. Oncotype test results are pending.  PREVIOUS RADIATION THERAPY: No  PAST MEDICAL HISTORY:  has a past medical history of Hypertension; Arthritis; Cataracts, bilateral; Depression; Anxiety; GERD (gastroesophageal reflux disease); Hyperlipidemia; Rib fracture; Shingles; Anemia; Allergy; Pre-diabetes; Obese; Gallstones; Wears glasses; PONV (postoperative nausea and vomiting); and Breast cancer.    PAST SURGICAL HISTORY: Past Surgical History  Procedure Laterality Date  . Cataract extraction bilateral w/ anterior vitrectomy    . Abdominal hysterectomy    . Cholecystectomy    . Knee arthroscopy Bilateral   . Tonsillectomy    . Colonoscopy    . Upper gi endoscopy    . Breast surgery      lt lump-neg  . Dupuytren / palmar fasciotomy  2007    rt    FAMILY HISTORY: family history includes COPD in her father; Heart attack in her mother; Hypertension in her father;  Stroke in her mother. There is no history of Colon polyps, Esophageal cancer, Rectal cancer, or Stomach cancer.  SOCIAL HISTORY:  reports that she has never smoked. She has never used smokeless tobacco. She reports that she drinks alcohol. She reports that she does not use illicit drugs.  ALLERGIES: Requip; Codeine; Minocycline; Morphine sulfate; Tetracyclines & related; Zestril; Zetia; Amoxicillin; and Sulfa antibiotics  MEDICATIONS:  Current Outpatient Prescriptions  Medication Sig Dispense Refill  . atorvastatin (LIPITOR) 80 MG tablet Take 80 mg by mouth daily. 1/2 TU TH SUN      . cholecalciferol (VITAMIN D) 1000 UNITS tablet Take 1,000 Units by mouth. 2      . cyclobenzaprine (FLEXERIL) 10 MG tablet Take 1 tablet (10 mg total) by mouth every 8 (eight) hours as needed for muscle spasms.  30 tablet  1  . doxazosin (CARDURA) 4 MG tablet Take 4 mg by mouth daily. 1/2 qd      . DULoxetine (CYMBALTA) 30 MG capsule Take 30 mg by mouth 3 (three) times daily.       Marland Kitchen gabapentin (NEURONTIN) 800 MG tablet 800 mg at bedtime. Take 1-or 1/2      . HYDROcodone-acetaminophen (NORCO) 10-325 MG per tablet Take 1 tablet by mouth every 6 (six) hours as needed.  20 tablet  0  . HYDROcodone-acetaminophen (NORCO) 5-325 MG per tablet 1/2-1 pill as needed for cough, pain, headache every 6 hours.  60 tablet  0  . meloxicam (MOBIC) 15 MG tablet Take 15 mg by mouth daily.      Marland Kitchen omeprazole (PRILOSEC) 40 MG capsule Take 40 mg by mouth daily.      Marland Kitchen  temazepam (RESTORIL) 30 MG capsule Take 30 mg by mouth at bedtime as needed for sleep.      . traMADol (ULTRAM) 50 MG tablet Take 50 mg by mouth every 6 (six) hours as needed.       . verapamil (CALAN-SR) 240 MG CR tablet Take 240 mg by mouth at bedtime.      Marland Kitchen albuterol (PROVENTIL HFA;VENTOLIN HFA) 108 (90 BASE) MCG/ACT inhaler Inhale 2 puffs into the lungs every 6 (six) hours as needed for wheezing or shortness of breath.  1 Inhaler  2  . promethazine (PHENERGAN) 25 MG  tablet Take 1 tablet (25 mg total) by mouth every 6 (six) hours as needed for nausea or vomiting.  60 tablet  0   No current facility-administered medications for this encounter.    REVIEW OF SYSTEMS:  Notable for that above.   PHYSICAL EXAM:  height is _0  (1.676 m) and weight is 227 lb 4.8 oz (103.103 kg). Her temperature is 98.3 F (36.8 C). Her blood pressure is 133/71 and her pulse is 80.   Alert and oriented Head normocephalic, extraocular movements intact Neck demonstrates no palpable cervical or supraclavicular lymphadenopathy Heart regular rate and rhythm Chest clear to auscultation bilaterally Abdomen soft nontender nondistended no rigidity or guarding Skin and breast exam:R breast mass, tenderness to palpation, ~2cm, at 1230 oclock ; Left breast LIQ scar well healed.Marland Kitchen Axilla clear bilaterally to palpation Psychiatric exam: Insight intact,  normal affect  ECOG = 0    Pathology: 06/06/2013: HER-2/NEU BY C[facility] - NO AMPLIFICATION OF HER-2 DETECTED.  FINAL DIAGNOSIS Diagnosis 1. Breast, lumpectomy, Left with radioactive seed - INVASIVE LOBULAR CARCINOMA, GRADE 1 OF 3, SPANNING 1.8 CM. - FINAL RESECTION MARGINS ARE NEGATIVE FOR CARCINOMA. - SEE ONCOLOGY TABLE. 2. Lymph node, sentinel, biopsy, Left axillary #1 - ONE OF ONE LYMPH NODES NEGATIVE FOR CARCINOMA (0/1). 3. Breast, excision, Left additional posterior margin - BENIGN BREAST TISSUE. - NO MALIGNANCY IDENTIFIED. 4. Breast, excision, Left additional inferior margin - BENIGN BREAST TISSUE. - NO MALIGNANCY IDENTIFIED. 1 of 4 FINAL for MELLA, INCLAN ([id number]) Diagnosis(continued) 5. Breast, excision, Left additional lateral margin - BENIGN BREAST TISSUE. - NO MALIGNANCY IDENTIFIED. Microscopic Comment 1. BREAST, INVASIVE TUMOR, WITH LYMPH NODE SAMPLING Specimen, including laterality and lymph node sampling (sentinel, non-sentinel): Left breast lumpectomy and sentinel lymph node. Procedure: Left breast  lumpectomy and sentinel lymph node biopsy. Histologic type: Invasive lobular carcinoma. Grade: 1 Tubule formation: 3 Nuclear pleomorphism: 1 Mitotic:1 Tumor size (gross measurement): 1.8 cm Margins: Final margins negative. Invasive, distance to closest margin: <0.2 cm of original lateral margin, >1 cm of final lateral margin. In-situ, distance to closest margin: If margin positive, focally or broadly: Lymphovascular invasion: Absent. Ductal carcinoma in situ: Absent. Lobular neoplasia: Present, lobular carcinoma in situ. Tumor focality: Unifocal. Treatment effect: N/A. Extent of tumor: Confined to breast parenchyma. Lymph nodes: Examined: 1 Sentinel 0 Non-sentinel 1 Total Lymph nodes with metastasis: 0 Isolated tumor cells (< 0.2 mm): 0 Micrometastasis: (> 0.2 mm and < 2.0 mm): 0 Macrometastasis: (> 2.0 mm): 0 Extracapsular extension: 0 Breast prognostic profile: Performed on [id number], Her2 will be repeated and reported in an addendum. Estrogen receptor: Positive, strong staining intensity. Progesterone receptor: Positive, strong staining intensity. Her 2 neu: Not amplified. Ki-67: 14% Non-neoplastic breast: Fibrocystic change with calcifications, usual ductal hyperplasia, collagenous spherulosis (stained positive with e-cadherin), biopsy site change. TNM: pT1c, pN0 Comments: Immunohistochemistry for e-cadherin is negative in the carcinoma, confirming lobular origin. There are  calcifications within carcinoma and benign tissue.   LABORATORY DATA:  Lab Results  Component Value Date   WBC 7.3 06/21/2013   HGB 10.3* 06/21/2013   HCT 31.3* 06/21/2013   MCV 82.4 06/21/2013   PLT 274 06/21/2013   CMP     Component Value Date/Time   NA 141 06/21/2013 1242   NA 143 05/31/2013 1427   K 3.8 06/21/2013 1242   K 3.7 05/31/2013 1427   CL 102 05/31/2013 1427   CO2 22 06/21/2013 1242   CO2 28 05/31/2013 1427   GLUCOSE 126 06/21/2013 1242   GLUCOSE 100* 05/31/2013 1427   BUN 17.9  06/21/2013 1242   BUN 18 05/31/2013 1427   CREATININE 0.8 06/21/2013 1242   CREATININE 0.78 05/31/2013 1427   CREATININE 0.83 03/28/2013 1425   CALCIUM 9.1 06/21/2013 1242   CALCIUM 9.5 05/31/2013 1427   PROT 6.9 06/21/2013 1242   PROT 7.4 05/31/2013 1427   ALBUMIN 3.5 06/21/2013 1242   ALBUMIN 3.7 05/31/2013 1427   AST 14 06/21/2013 1242   AST 15 05/31/2013 1427   ALT 15 06/21/2013 1242   ALT 14 05/31/2013 1427   ALKPHOS 108 06/21/2013 1242   ALKPHOS 111 05/31/2013 1427   BILITOT 0.33 06/21/2013 1242   BILITOT <0.2* 05/31/2013 1427   GFRNONAA 85* 05/31/2013 1427   GFRAA >90 05/31/2013 1427         RADIOGRAPHY: Nm Sentinel Node Inj-no Rpt (breast)  06/06/2013   CLINICAL DATA: left axillary snbx   Sulfur colloid was injected intradermally by the nuclear medicine  technologist for breast cancer sentinel node localization.    US Breast Surgical Specimen  06/06/2013   CLINICAL DATA:  Status post lumpectomy following seed localization.  EXAM: SPECIMEN RADIOGRAPH OF THE left BREAST  COMPARISON:  Previous exam(s)  FINDINGS: Status post excision of the left breast. Radioactive seed is present and is marked for pathology. The previously placed clip is also localized for pathology.  IMPRESSION: Specimen radiograph of the left breast.  No apparent complications   Electronically Signed   By: Shon Hale M.D.   On: 06/06/2013 14:13   Mm Lt Plc Breast Loc Dev   1st Lesion  Inc Mammo Guide  06/04/2013   CLINICAL DATA:  Patient presents for seed localization prior to left lumpectomy.  EXAM: MAMMOGRAPHIC GUIDED RADIOACTIVE SEED LOCALIZATION OF THE left BREAST  COMPARISON:  Previous exam(s)  FINDINGS: Patient presents for radioactive seed localization prior to lumpectomy. I met with the patient and we discussed the procedure of seed localization including benefits and alternatives. We discussed the high likelihood of a successful procedure. We discussed the risks of the procedure including infection, bleeding, tissue  injury and further surgery. We discussed the low dose of radioactivity involved in the procedure. Informed, written consent was given.  The usual time-out protocol was performed immediately prior to the procedure.  Using mammographic guidance, sterile technique, 2% lidocaine and an I-125 radioactive seed, mass and clip in the medial portion of the left breast are localized using a medial approach. The follow-up mammogram images confirm the seed in the expected location and are marked for Dr. Donne Hazel.  Follow-up survey of the patient confirms presence of radioactive seed.  Order number of I-125 seed:  [id number].  Dose of I-125 seed:  0.25 mCi.  The patient tolerated the procedure well and was released from the [facility]. She was given instructions regarding seed removal. Surgery is scheduled for 06/06/2013.  IMPRESSION: Radioactive seed localization of the  left breast. No apparent complications.   Electronically Signed   By: Shon Hale M.D.   On: 06/04/2013 14:43      IMPRESSION/PLAN:  It was a pleasure meeting the patient today. We discussed the risks, benefits, and side effects of radiotherapy. Radio therapy should improve her local control by about two thirds following lumpectomy . We discussed that radiation would take approximately 6 weeks to complete and that her disposition for chemotherapy needs to be finalized before starting treatment planning. We spoke about acute effects including skin irritation and fatigue as well as much less common late effects including lung and heart irritation. We spoke about the latest technology that is used to minimize the risk of late effects for breast cancer patients undergoing radiotherapy. No guarantees of treatment were given. The patient is enthusiastic about proceeding with treatment. I look forward to participating in the patient's care.  Plan to simulate for RT once done with chemotherapy, or once cleared from chemotherapy pending oncotype results. I will  await the patient be referred back to me by medical oncology  I have ordered an ultrasound for a palpable R breast mass at 1230 o'clock identified at physical exam today. There is no comment about this mass on mammography report. Rationale for test discussed with patient.  __________________________________________   Eppie Gibson, MD

## 2013-06-26 NOTE — Progress Notes (Signed)
Location of Breast Cancer: Left Breast - Lower Inner Quadrant  Histology per Pathology Report:   06/06/13 Diagnosis 1. Breast, lumpectomy, Left with radioactive seed - INVASIVE LOBULAR CARCINOMA, GRADE 1 OF 3, SPANNING 1.8 CM. - FINAL RESECTION MARGINS ARE NEGATIVE FOR CARCINOMA. - SEE ONCOLOGY TABLE. 2. Lymph node, sentinel, biopsy, Left axillary #1 - ONE OF ONE LYMPH NODES NEGATIVE FOR CARCINOMA (0/1). 3. Breast, excision, Left additional posterior margin - BENIGN BREAST TISSUE. - NO MALIGNANCY IDENTIFIED. 4. Breast, excision, Left additional inferior margin - BENIGN BREAST TISSUE.  Examined: 1 Sentinel 0 Non-sentinel 1 Total Lymph nodes with metastasis: 0 - NO MALIGNANCY IDENTIFIED. 1 of 4 FINAL for VENIA, RIVERON ([id number]) Diagnosis(continued) 5. Breast, excision, Left additional lateral margin - BENIGN BREAST TISSUE. - NO MALIGNANCY IDENTIFIED.  05/15/13 Diagnosis Breast, left, needle core biopsy, mass, 8:30 o'clock, 9 cm / nipple - INVASIVE MAMMARY CARCINOMA, SEE COMMENT  Receptor Status: ER(100%), PR (88%), Her2-neu (No amplification), Ki-67(14%)   Patient Presentation:  68 year old female with a family history of breast cancer in a sister in her 5s who presents after undergoing screening mammography. She has breast density category C. She was noted to have a mass in the lower inner quadrant of her left breast with spiculated margins and associated microcalcifications measuring approximately 10 mm. Target ultrasound was performed showing an irregular hypoechoic mass at 9:00 6-7 cm from the nipple that measured 5 x 4 x 5 mm. This corresponds with the mammography findings. No lymphadenopathy is seen in her left axilla   Past/Anticipated interventions by surgeon, if SMO:[id number] of Left Breast with Axillary Dissection of 1 Sentinel Node  Past/Anticipated interventions by medical oncology, if any: Chemotherapy: Appt. On 08/23/13  Lymphedema issues, if any:  None  Pain issues, if any:   SAFETY ISSUES:  Prior radiation? No  Pacemaker/ICD? NO  Possible current pregnancy?No       Is the patient on methotrexate? NO  Current Complaints / other details:  Never Smoked, Alcohol socially, no drug use    Shalamar Plourde, Crista Curb, RN 06/26/2013,10:40 AM

## 2013-07-01 ENCOUNTER — Telehealth: Payer: Self-pay

## 2013-07-01 DIAGNOSIS — C50919 Malignant neoplasm of unspecified site of unspecified female breast: Secondary | ICD-10-CM | POA: Diagnosis not present

## 2013-07-01 NOTE — Telephone Encounter (Signed)
Pt left msg for test results.  LMOVM for return call to clinic desk nurse.  Marland Kitchen

## 2013-07-02 ENCOUNTER — Encounter: Payer: Self-pay | Admitting: *Deleted

## 2013-07-02 DIAGNOSIS — H35369 Drusen (degenerative) of macula, unspecified eye: Secondary | ICD-10-CM | POA: Diagnosis not present

## 2013-07-02 NOTE — Progress Notes (Signed)
Received Oncotype Dx results of 11.  Placed a copy in Dr. Laurelyn Sickle box and took a copy to HIM to scan.

## 2013-07-03 NOTE — Telephone Encounter (Signed)
This RN left voice message on patient's cell phone that we are waiting on the Oncotype results. The Oncotype determines if a patient needs chemotherapy or not. Results can take 2 - 3 weeks to get back. I informed patient that once we received the results, someone from [facility] will get in touch with her. Instructed patient to call if she has any further questions.

## 2013-07-05 ENCOUNTER — Ambulatory Visit
Admission: RE | Admit: 2013-07-05 | Discharge: 2013-07-05 | Disposition: A | Discharge status: Home / Self Care | Payer: BC Managed Care – PPO | Source: Ambulatory Visit | Attending: Radiation Oncology | Admitting: Radiation Oncology

## 2013-07-05 ENCOUNTER — Encounter: Payer: Self-pay | Admitting: Radiation Oncology

## 2013-07-05 DIAGNOSIS — N63 Unspecified lump in unspecified breast: Secondary | ICD-10-CM

## 2013-07-05 DIAGNOSIS — N6489 Other specified disorders of breast: Secondary | ICD-10-CM | POA: Diagnosis not present

## 2013-07-05 NOTE — Progress Notes (Signed)
I spoke w/ the patient about her negative US/mammogram and that I received word from Dr Humphrey Rolls and she can proceed with RT. Her score is low so no chemotherapy. She will only receive antiestrogen therapy. We will schedule simulation next week. -----------------------------------  [name] Gibson, MD

## 2013-07-09 ENCOUNTER — Other Ambulatory Visit: Payer: Self-pay | Admitting: Physician Assistant

## 2013-07-10 ENCOUNTER — Ambulatory Visit
Admission: RE | Admit: 2013-07-10 | Discharge: 2013-07-10 | Disposition: A | Discharge status: Home / Self Care | Payer: Medicare Other | Source: Ambulatory Visit | Attending: Radiation Oncology | Admitting: Radiation Oncology

## 2013-07-10 DIAGNOSIS — Z510 Encounter for antineoplastic radiation therapy: Secondary | ICD-10-CM | POA: Diagnosis not present

## 2013-07-10 DIAGNOSIS — C50319 Malignant neoplasm of lower-inner quadrant of unspecified female breast: Secondary | ICD-10-CM | POA: Insufficient documentation

## 2013-07-10 DIAGNOSIS — C50312 Malignant neoplasm of lower-inner quadrant of left female breast: Secondary | ICD-10-CM

## 2013-07-10 NOTE — Progress Notes (Signed)
  Radiation Oncology         (336) [phone] ________________________________  Name: [name] [name] MRN: [id number]  Date: 07/10/2013  DOB: 05/11/1945  SIMULATION AND TREATMENT PLANNING NOTE  Outpatient  DIAGNOSIS:  Left breast Cancer  NARRATIVE:  The patient was brought to the [facility].  Identity was confirmed.  All relevant records and images related to the planned course of therapy were reviewed.  The patient freely provided informed written consent to proceed with treatment after reviewing the details related to the planned course of therapy. The consent form was witnessed and verified by the simulation staff.    Then, the patient was set-up in a stable reproducible  supine position for radiation therapy.  CT images were obtained.  Surface markings were placed.  The CT images were loaded into the planning software.     RESPIRATORY MOTION MANAGEMENT SIMULATION  NARRATIVE:  In order to account for effect of respiratory motion on target structures and other organs in the planning and delivery of radiotherapy, this patient underwent respiratory motion management simulation.  To accomplish this, when the patient was brought to the CT simulation planning suite, 4D respiratoy motion management CT images were obtained.  The CT images were loaded into the planning software.  Then, using a variety of tools including Cine, MIP, and standard views, the target volume and planning target volumes (PTV) were delineated.  Avoidance structures were contoured.  Treatment planning then occurred.     TREATMENT PLANNING NOTE: Treatment planning then occurred.  The radiation prescription was entered and confirmed.    A total of 3 medically necessary complex treatment devices were fabricated and supervised by me - 2 tangential fields with MLCs to block heart, lung and a VACLOCK. I have requested : 3D Simulation  I have requested a DVH of the following structures: heart, lung, lump cav.    The  patient will receive 50 Gy in 25 fractions to her Left breast. A boost will be planned later.  Optical Surface Tracking Plan:  Since intensity modulated radiotherapy (IMRT) and 3D conformal radiation treatment methods are predicated on accurate and precise positioning for treatment, intrafraction motion monitoring is medically necessary to ensure accurate and safe treatment delivery. The ability to quantify intrafraction motion without excessive ionizing radiation dose can only be performed with optical surface tracking. Accordingly, surface imaging offers the opportunity to obtain 3D measurements of patient position throughout IMRT and 3D treatments without excessive radiation exposure. I am ordering optical surface tracking for this patient's upcoming course of radiotherapy.  ________________________________   Reference:  Ursula Alert, J, et al. Surface imaging-based analysis of intrafraction motion for breast radiotherapy patients.Journal of [employer], n. 6, nov. 2014. ISSN [id number].  Available at: <http://www.jacmp.org/index.php/jacmp/article/view/4957>.   -----------------------------------  Eppie Gibson, MD

## 2013-07-12 ENCOUNTER — Ambulatory Visit: Payer: Medicare Other | Admitting: Radiation Oncology

## 2013-07-16 DIAGNOSIS — M999 Biomechanical lesion, unspecified: Secondary | ICD-10-CM | POA: Diagnosis not present

## 2013-07-16 DIAGNOSIS — M545 Low back pain, unspecified: Secondary | ICD-10-CM | POA: Diagnosis not present

## 2013-07-16 DIAGNOSIS — M5137 Other intervertebral disc degeneration, lumbosacral region: Secondary | ICD-10-CM | POA: Diagnosis not present

## 2013-07-16 DIAGNOSIS — IMO0001 Reserved for inherently not codable concepts without codable children: Secondary | ICD-10-CM | POA: Diagnosis not present

## 2013-07-16 DIAGNOSIS — M9981 Other biomechanical lesions of cervical region: Secondary | ICD-10-CM | POA: Diagnosis not present

## 2013-07-16 DIAGNOSIS — M5430 Sciatica, unspecified side: Secondary | ICD-10-CM | POA: Diagnosis not present

## 2013-07-16 DIAGNOSIS — M47814 Spondylosis without myelopathy or radiculopathy, thoracic region: Secondary | ICD-10-CM | POA: Diagnosis not present

## 2013-07-17 ENCOUNTER — Ambulatory Visit
Admission: RE | Admit: 2013-07-17 | Discharge: 2013-07-17 | Disposition: A | Discharge status: Home / Self Care | Payer: Medicare Other | Source: Ambulatory Visit | Attending: Radiation Oncology | Admitting: Radiation Oncology

## 2013-07-17 ENCOUNTER — Encounter: Payer: Self-pay | Admitting: Radiation Oncology

## 2013-07-17 DIAGNOSIS — Z510 Encounter for antineoplastic radiation therapy: Secondary | ICD-10-CM | POA: Diagnosis not present

## 2013-07-17 DIAGNOSIS — C50319 Malignant neoplasm of lower-inner quadrant of unspecified female breast: Secondary | ICD-10-CM | POA: Diagnosis not present

## 2013-07-17 NOTE — Progress Notes (Signed)
Simulation Verification Note LOWER INNER QUADRANT BREAST CANCER, left OUTPATIENT  The patient was brought to the treatment unit and placed in the planned treatment position. The clinical setup was verified. Then port films were obtained and uploaded to the radiation oncology medical record software.  The treatment beams were carefully compared against the planned radiation fields. The position location and shape of the radiation fields was reviewed. They targeted volume of tissue appears to be appropriately covered by the radiation beams. Organs at risk appear to be excluded as planned.  Based on my personal review, I approved the simulation verification. The patient's treatment will proceed as planned.  -----------------------------------  Eppie Gibson, MD

## 2013-07-18 ENCOUNTER — Ambulatory Visit
Admission: RE | Admit: 2013-07-18 | Discharge: 2013-07-18 | Disposition: A | Discharge status: Home / Self Care | Payer: Medicare Other | Source: Ambulatory Visit | Attending: Radiation Oncology | Admitting: Radiation Oncology

## 2013-07-18 DIAGNOSIS — M47814 Spondylosis without myelopathy or radiculopathy, thoracic region: Secondary | ICD-10-CM | POA: Diagnosis not present

## 2013-07-18 DIAGNOSIS — Z510 Encounter for antineoplastic radiation therapy: Secondary | ICD-10-CM | POA: Diagnosis not present

## 2013-07-18 DIAGNOSIS — M5137 Other intervertebral disc degeneration, lumbosacral region: Secondary | ICD-10-CM | POA: Diagnosis not present

## 2013-07-18 DIAGNOSIS — M5430 Sciatica, unspecified side: Secondary | ICD-10-CM | POA: Diagnosis not present

## 2013-07-18 DIAGNOSIS — M999 Biomechanical lesion, unspecified: Secondary | ICD-10-CM | POA: Diagnosis not present

## 2013-07-18 DIAGNOSIS — C50319 Malignant neoplasm of lower-inner quadrant of unspecified female breast: Secondary | ICD-10-CM | POA: Diagnosis not present

## 2013-07-18 DIAGNOSIS — IMO0001 Reserved for inherently not codable concepts without codable children: Secondary | ICD-10-CM | POA: Diagnosis not present

## 2013-07-18 DIAGNOSIS — M9981 Other biomechanical lesions of cervical region: Secondary | ICD-10-CM | POA: Diagnosis not present

## 2013-07-19 ENCOUNTER — Ambulatory Visit: Payer: Medicare Other

## 2013-07-19 ENCOUNTER — Ambulatory Visit
Admission: RE | Admit: 2013-07-19 | Discharge: 2013-07-19 | Disposition: A | Discharge status: Home / Self Care | Payer: Medicare Other | Source: Ambulatory Visit | Attending: Radiation Oncology | Admitting: Radiation Oncology

## 2013-07-19 DIAGNOSIS — Z510 Encounter for antineoplastic radiation therapy: Secondary | ICD-10-CM | POA: Diagnosis not present

## 2013-07-19 DIAGNOSIS — N200 Calculus of kidney: Secondary | ICD-10-CM | POA: Diagnosis not present

## 2013-07-19 DIAGNOSIS — C50319 Malignant neoplasm of lower-inner quadrant of unspecified female breast: Secondary | ICD-10-CM | POA: Diagnosis not present

## 2013-07-19 DIAGNOSIS — K7689 Other specified diseases of liver: Secondary | ICD-10-CM | POA: Diagnosis not present

## 2013-07-22 ENCOUNTER — Ambulatory Visit
Admission: RE | Admit: 2013-07-22 | Discharge: 2013-07-22 | Disposition: A | Discharge status: Home / Self Care | Payer: Medicare Other | Source: Ambulatory Visit | Attending: Radiation Oncology | Admitting: Radiation Oncology

## 2013-07-22 ENCOUNTER — Ambulatory Visit: Payer: Medicare Other

## 2013-07-22 VITALS — BP 120/65 | HR 70 | Temp 98.7°F | Ht 66.0 in

## 2013-07-22 DIAGNOSIS — M5430 Sciatica, unspecified side: Secondary | ICD-10-CM | POA: Diagnosis not present

## 2013-07-22 DIAGNOSIS — C50312 Malignant neoplasm of lower-inner quadrant of left female breast: Secondary | ICD-10-CM

## 2013-07-22 DIAGNOSIS — M999 Biomechanical lesion, unspecified: Secondary | ICD-10-CM | POA: Diagnosis not present

## 2013-07-22 DIAGNOSIS — C50319 Malignant neoplasm of lower-inner quadrant of unspecified female breast: Secondary | ICD-10-CM | POA: Diagnosis not present

## 2013-07-22 DIAGNOSIS — M5137 Other intervertebral disc degeneration, lumbosacral region: Secondary | ICD-10-CM | POA: Diagnosis not present

## 2013-07-22 DIAGNOSIS — Z510 Encounter for antineoplastic radiation therapy: Secondary | ICD-10-CM | POA: Diagnosis not present

## 2013-07-22 DIAGNOSIS — IMO0001 Reserved for inherently not codable concepts without codable children: Secondary | ICD-10-CM | POA: Diagnosis not present

## 2013-07-22 DIAGNOSIS — M47814 Spondylosis without myelopathy or radiculopathy, thoracic region: Secondary | ICD-10-CM | POA: Diagnosis not present

## 2013-07-22 DIAGNOSIS — M9981 Other biomechanical lesions of cervical region: Secondary | ICD-10-CM | POA: Diagnosis not present

## 2013-07-22 MED ORDER — RADIAPLEXRX EX GEL
Freq: Once | CUTANEOUS | Status: AC
  Administered 2013-07-22: 20:00:00 via TOPICAL

## 2013-07-22 MED ORDER — ALRA NON-METALLIC DEODORANT (RAD-ONC)
1.0000 "application " | Freq: Once | TOPICAL | Status: AC
  Administered 2013-07-22: 1 via TOPICAL

## 2013-07-22 NOTE — Progress Notes (Signed)
Ms. Aldredge has received 4 fractions to her left breast.  She appears very fatigued and reports nausea without emesis since this weekend.  She has a script for Phenergan at home, but has not taken any.   Post sim education today  Regarding side-effect management related to treatment to her breast inclusive of skin changes/irritation, fatigue and pain.  Given the radiation therapy and you booklet with the appropriate pages marked.  Dicussed management of her nausea with the phenergan she has at home.  Will report to Dr. Isidore Moos.  Given Radiaplex Gel to use after treatment and at bedtime and given Alra Deodorant.

## 2013-07-22 NOTE — Progress Notes (Signed)
   Weekly Management Note:  outpatient Current Dose:  6 Gy  Projected Dose: 60 Gy   Narrative:  The patient presents for routine under treatment assessment.  CBCT/MVCT images/Port film x-rays were reviewed.  The chart was checked. Had some nausea, starting on Fri 4-10.   Physical Findings:  height is 5\' 6"  (1.676 m). Her temperature is 98.7 F (37.1 C). Her blood pressure is 120/65 and her pulse is 70.  NAD, no skin changes over left breast  Impression:  The patient is tolerating radiotherapy.  Plan:  Continue radiotherapy as planned. Recommended phenergan for nausea, prn.  ________________________________   Eppie Gibson, M.D.

## 2013-07-22 NOTE — Addendum Note (Signed)
Encounter addended by: Deirdre Evener, RN on: 07/22/2013  7:31 PM<BR>     Documentation filed: Inpatient MAR

## 2013-07-23 ENCOUNTER — Ambulatory Visit
Admission: RE | Admit: 2013-07-23 | Discharge: 2013-07-23 | Disposition: A | Discharge status: Home / Self Care | Payer: Medicare Other | Source: Ambulatory Visit | Attending: Radiation Oncology | Admitting: Radiation Oncology

## 2013-07-23 DIAGNOSIS — C50319 Malignant neoplasm of lower-inner quadrant of unspecified female breast: Secondary | ICD-10-CM | POA: Diagnosis not present

## 2013-07-23 DIAGNOSIS — Z510 Encounter for antineoplastic radiation therapy: Secondary | ICD-10-CM | POA: Diagnosis not present

## 2013-07-24 ENCOUNTER — Ambulatory Visit
Admission: RE | Admit: 2013-07-24 | Discharge: 2013-07-24 | Disposition: A | Discharge status: Home / Self Care | Payer: Medicare Other | Source: Ambulatory Visit | Attending: Radiation Oncology | Admitting: Radiation Oncology

## 2013-07-24 DIAGNOSIS — Z510 Encounter for antineoplastic radiation therapy: Secondary | ICD-10-CM | POA: Diagnosis not present

## 2013-07-24 DIAGNOSIS — C50319 Malignant neoplasm of lower-inner quadrant of unspecified female breast: Secondary | ICD-10-CM | POA: Diagnosis not present

## 2013-07-25 ENCOUNTER — Ambulatory Visit
Admission: RE | Admit: 2013-07-25 | Discharge: 2013-07-25 | Disposition: A | Discharge status: Home / Self Care | Payer: Medicare Other | Source: Ambulatory Visit | Attending: Radiation Oncology | Admitting: Radiation Oncology

## 2013-07-25 DIAGNOSIS — C50319 Malignant neoplasm of lower-inner quadrant of unspecified female breast: Secondary | ICD-10-CM | POA: Diagnosis not present

## 2013-07-25 DIAGNOSIS — Z510 Encounter for antineoplastic radiation therapy: Secondary | ICD-10-CM | POA: Diagnosis not present

## 2013-07-26 ENCOUNTER — Encounter (HOSPITAL_COMMUNITY): Payer: Self-pay

## 2013-07-26 ENCOUNTER — Ambulatory Visit
Admission: RE | Admit: 2013-07-26 | Discharge: 2013-07-26 | Disposition: A | Discharge status: Home / Self Care | Payer: Medicare Other | Source: Ambulatory Visit | Attending: Radiation Oncology | Admitting: Radiation Oncology

## 2013-07-26 DIAGNOSIS — H43399 Other vitreous opacities, unspecified eye: Secondary | ICD-10-CM | POA: Diagnosis not present

## 2013-07-26 DIAGNOSIS — H43819 Vitreous degeneration, unspecified eye: Secondary | ICD-10-CM | POA: Diagnosis not present

## 2013-07-26 DIAGNOSIS — Z510 Encounter for antineoplastic radiation therapy: Secondary | ICD-10-CM | POA: Diagnosis not present

## 2013-07-26 DIAGNOSIS — H353190 Nonexudative age-related macular degeneration, unspecified eye, stage unspecified: Secondary | ICD-10-CM | POA: Diagnosis not present

## 2013-07-26 DIAGNOSIS — C50319 Malignant neoplasm of lower-inner quadrant of unspecified female breast: Secondary | ICD-10-CM | POA: Diagnosis not present

## 2013-07-26 DIAGNOSIS — H35369 Drusen (degenerative) of macula, unspecified eye: Secondary | ICD-10-CM | POA: Diagnosis not present

## 2013-07-29 ENCOUNTER — Ambulatory Visit
Admission: RE | Admit: 2013-07-29 | Discharge: 2013-07-29 | Disposition: A | Discharge status: Home / Self Care | Payer: Medicare Other | Source: Ambulatory Visit | Attending: Radiation Oncology | Admitting: Radiation Oncology

## 2013-07-29 VITALS — BP 138/63 | HR 78 | Temp 99.0°F | Wt 224.2 lb

## 2013-07-29 DIAGNOSIS — Z510 Encounter for antineoplastic radiation therapy: Secondary | ICD-10-CM | POA: Diagnosis not present

## 2013-07-29 DIAGNOSIS — C50319 Malignant neoplasm of lower-inner quadrant of unspecified female breast: Secondary | ICD-10-CM | POA: Diagnosis not present

## 2013-07-29 DIAGNOSIS — C50312 Malignant neoplasm of lower-inner quadrant of left female breast: Secondary | ICD-10-CM

## 2013-07-29 NOTE — Progress Notes (Signed)
Patient here for weekly assessment of radiation to left breast.Completed 8 of 25 treatments.Denies pain.Mildly discolored.No concerns.

## 2013-07-29 NOTE — Progress Notes (Signed)
   Weekly Management Note:  outpatient Current Dose:  16 Gy  Projected Dose: 60  Gy   Narrative:  The patient presents for routine under treatment assessment.  CBCT/MVCT images/Port film x-rays were reviewed.  The chart was checked. Doing well  Physical Findings:  weight is 224 lb 3.2 oz (101.696 kg). Her temperature is 99 F (37.2 C). Her blood pressure is 138/63 and her pulse is 78.  mild hyperpigmentation of left breast  Impression:  The patient is tolerating radiotherapy.  Plan:  Continue radiotherapy as planned.   ________________________________   Eppie Gibson, M.D.

## 2013-07-30 ENCOUNTER — Ambulatory Visit
Admission: RE | Admit: 2013-07-30 | Discharge: 2013-07-30 | Disposition: A | Discharge status: Home / Self Care | Payer: Medicare Other | Source: Ambulatory Visit | Attending: Radiation Oncology | Admitting: Radiation Oncology

## 2013-07-30 DIAGNOSIS — M545 Low back pain, unspecified: Secondary | ICD-10-CM | POA: Diagnosis not present

## 2013-07-30 DIAGNOSIS — C50319 Malignant neoplasm of lower-inner quadrant of unspecified female breast: Secondary | ICD-10-CM | POA: Diagnosis not present

## 2013-07-30 DIAGNOSIS — Z510 Encounter for antineoplastic radiation therapy: Secondary | ICD-10-CM | POA: Diagnosis not present

## 2013-07-31 ENCOUNTER — Ambulatory Visit
Admission: RE | Admit: 2013-07-31 | Discharge: 2013-07-31 | Disposition: A | Discharge status: Home / Self Care | Payer: Medicare Other | Source: Ambulatory Visit | Attending: Radiation Oncology | Admitting: Radiation Oncology

## 2013-07-31 DIAGNOSIS — C50319 Malignant neoplasm of lower-inner quadrant of unspecified female breast: Secondary | ICD-10-CM | POA: Diagnosis not present

## 2013-07-31 DIAGNOSIS — Z510 Encounter for antineoplastic radiation therapy: Secondary | ICD-10-CM | POA: Diagnosis not present

## 2013-08-01 ENCOUNTER — Ambulatory Visit
Admission: RE | Admit: 2013-08-01 | Discharge: 2013-08-01 | Disposition: A | Discharge status: Home / Self Care | Payer: Medicare Other | Source: Ambulatory Visit | Attending: Radiation Oncology | Admitting: Radiation Oncology

## 2013-08-01 ENCOUNTER — Other Ambulatory Visit: Payer: Self-pay | Admitting: Internal Medicine

## 2013-08-01 DIAGNOSIS — C50319 Malignant neoplasm of lower-inner quadrant of unspecified female breast: Secondary | ICD-10-CM | POA: Diagnosis not present

## 2013-08-01 DIAGNOSIS — Z510 Encounter for antineoplastic radiation therapy: Secondary | ICD-10-CM | POA: Diagnosis not present

## 2013-08-02 ENCOUNTER — Ambulatory Visit
Admission: RE | Admit: 2013-08-02 | Discharge: 2013-08-02 | Disposition: A | Discharge status: Home / Self Care | Payer: Medicare Other | Source: Ambulatory Visit | Attending: Radiation Oncology | Admitting: Radiation Oncology

## 2013-08-02 DIAGNOSIS — C50319 Malignant neoplasm of lower-inner quadrant of unspecified female breast: Secondary | ICD-10-CM | POA: Diagnosis not present

## 2013-08-02 DIAGNOSIS — Z510 Encounter for antineoplastic radiation therapy: Secondary | ICD-10-CM | POA: Diagnosis not present

## 2013-08-05 ENCOUNTER — Ambulatory Visit
Admission: RE | Admit: 2013-08-05 | Discharge: 2013-08-05 | Disposition: A | Discharge status: Home / Self Care | Payer: Medicare Other | Source: Ambulatory Visit | Attending: Radiation Oncology | Admitting: Radiation Oncology

## 2013-08-05 VITALS — BP 159/77 | HR 86 | Temp 98.5°F | Ht 66.0 in | Wt 225.9 lb

## 2013-08-05 DIAGNOSIS — Z510 Encounter for antineoplastic radiation therapy: Secondary | ICD-10-CM | POA: Diagnosis not present

## 2013-08-05 DIAGNOSIS — C50319 Malignant neoplasm of lower-inner quadrant of unspecified female breast: Secondary | ICD-10-CM | POA: Diagnosis not present

## 2013-08-05 DIAGNOSIS — C50312 Malignant neoplasm of lower-inner quadrant of left female breast: Secondary | ICD-10-CM

## 2013-08-05 NOTE — Progress Notes (Addendum)
[name] [name] has had 13 fractions to her left breast.  She is having pain in her lower back that shoots down her leg.  She rates it as a 10/10 when standing. She is taking percocet PRN. She has seen Dr. [name] who put her on a prednisone dose pak last week.  She said she has been trying to contact him about doing an MRI.  She reports fatigue.  The skin on her left breast is pink and intact.  She is using radiaplex gel twice a day.  Her bp is elevated today at 160/78 and 159/77 when retaken.

## 2013-08-05 NOTE — Progress Notes (Signed)
   Weekly Management Note:  outpatient Current Dose:  26 Gy  Projected Dose: 60 Gy   Narrative:  The patient presents for routine under treatment assessment.  CBCT/MVCT images/Port film x-rays were reviewed.  The chart was checked. No new complaints related to RT. Using Radiaplex. Low back pain x 2-3 weeks managed by her orthopedist with prednisone.  Physical Findings:  height is 5\' 6"  (1.676 m) and weight is 225 lb 14.4 oz (102.468 kg). Her temperature is 98.5 F (36.9 C). Her blood pressure is 159/77 and her pulse is 86.  sitting with leg propped up on chair.  Left breast - hyperpigmentation with intact skin.  Impression:  The patient is tolerating radiotherapy.  Plan:  Continue radiotherapy as planned. She is calling orthopedist to schedule an MRI for her back.  ________________________________   Eppie Gibson, M.D.

## 2013-08-06 ENCOUNTER — Ambulatory Visit
Admission: RE | Admit: 2013-08-06 | Discharge: 2013-08-06 | Disposition: A | Discharge status: Home / Self Care | Payer: Medicare Other | Source: Ambulatory Visit | Attending: Radiation Oncology | Admitting: Radiation Oncology

## 2013-08-06 DIAGNOSIS — Z510 Encounter for antineoplastic radiation therapy: Secondary | ICD-10-CM | POA: Diagnosis not present

## 2013-08-06 DIAGNOSIS — C50319 Malignant neoplasm of lower-inner quadrant of unspecified female breast: Secondary | ICD-10-CM | POA: Diagnosis not present

## 2013-08-06 DIAGNOSIS — M47817 Spondylosis without myelopathy or radiculopathy, lumbosacral region: Secondary | ICD-10-CM | POA: Diagnosis not present

## 2013-08-07 ENCOUNTER — Telehealth: Payer: Self-pay | Admitting: *Deleted

## 2013-08-07 ENCOUNTER — Ambulatory Visit
Admission: RE | Admit: 2013-08-07 | Discharge: 2013-08-07 | Disposition: A | Discharge status: Home / Self Care | Payer: Medicare Other | Source: Ambulatory Visit | Attending: Radiation Oncology | Admitting: Radiation Oncology

## 2013-08-07 DIAGNOSIS — Z510 Encounter for antineoplastic radiation therapy: Secondary | ICD-10-CM | POA: Diagnosis not present

## 2013-08-07 DIAGNOSIS — C50319 Malignant neoplasm of lower-inner quadrant of unspecified female breast: Secondary | ICD-10-CM | POA: Diagnosis not present

## 2013-08-07 NOTE — Telephone Encounter (Signed)
Spoke to pt concerning r/s f/u appt with Dr. Humphrey Rolls.  Informed pt that Dr. Humphrey Rolls is on LOA and she will need to see one of her partners.  Confirmed new appt date and time with Dr. Marko Plume on 09/18/13 at 1:00pm.  Pt denies further needs at this time.

## 2013-08-08 ENCOUNTER — Ambulatory Visit
Admission: RE | Admit: 2013-08-08 | Discharge: 2013-08-08 | Disposition: A | Discharge status: Home / Self Care | Payer: Medicare Other | Source: Ambulatory Visit | Attending: Radiation Oncology | Admitting: Radiation Oncology

## 2013-08-08 DIAGNOSIS — M545 Low back pain, unspecified: Secondary | ICD-10-CM | POA: Diagnosis not present

## 2013-08-08 DIAGNOSIS — C50319 Malignant neoplasm of lower-inner quadrant of unspecified female breast: Secondary | ICD-10-CM | POA: Diagnosis not present

## 2013-08-08 DIAGNOSIS — Z510 Encounter for antineoplastic radiation therapy: Secondary | ICD-10-CM | POA: Diagnosis not present

## 2013-08-09 ENCOUNTER — Ambulatory Visit
Admission: RE | Admit: 2013-08-09 | Discharge: 2013-08-09 | Disposition: A | Discharge status: Home / Self Care | Payer: Medicare Other | Source: Ambulatory Visit | Attending: Radiation Oncology | Admitting: Radiation Oncology

## 2013-08-09 DIAGNOSIS — C50319 Malignant neoplasm of lower-inner quadrant of unspecified female breast: Secondary | ICD-10-CM | POA: Diagnosis not present

## 2013-08-09 DIAGNOSIS — Z510 Encounter for antineoplastic radiation therapy: Secondary | ICD-10-CM | POA: Diagnosis not present

## 2013-08-09 DIAGNOSIS — IMO0002 Reserved for concepts with insufficient information to code with codable children: Secondary | ICD-10-CM | POA: Diagnosis not present

## 2013-08-12 ENCOUNTER — Encounter: Payer: Self-pay | Admitting: Radiation Oncology

## 2013-08-12 ENCOUNTER — Ambulatory Visit
Admission: RE | Admit: 2013-08-12 | Discharge: 2013-08-12 | Disposition: A | Discharge status: Home / Self Care | Payer: Medicare Other | Source: Ambulatory Visit | Attending: Radiation Oncology | Admitting: Radiation Oncology

## 2013-08-12 ENCOUNTER — Ambulatory Visit: Admission: RE | Admit: 2013-08-12 | Payer: Medicare Other | Source: Ambulatory Visit | Admitting: Radiation Oncology

## 2013-08-12 VITALS — BP 134/88 | HR 81 | Temp 98.3°F | Ht 66.0 in | Wt 224.6 lb

## 2013-08-12 DIAGNOSIS — C50319 Malignant neoplasm of lower-inner quadrant of unspecified female breast: Secondary | ICD-10-CM | POA: Diagnosis not present

## 2013-08-12 DIAGNOSIS — C50312 Malignant neoplasm of lower-inner quadrant of left female breast: Secondary | ICD-10-CM

## 2013-08-12 DIAGNOSIS — Z510 Encounter for antineoplastic radiation therapy: Secondary | ICD-10-CM | POA: Diagnosis not present

## 2013-08-12 MED ORDER — ONDANSETRON HCL 4 MG PO TABS
8.0000 mg | ORAL_TABLET | Freq: Once | ORAL | Status: DC
  Filled 2013-08-12: qty 2

## 2013-08-12 MED ORDER — ONDANSETRON 8 MG PO TBDP
8.0000 mg | ORAL_TABLET | Freq: Once | ORAL | Status: AC
Start: 2013-08-12 — End: 2013-08-12
  Administered 2013-08-12: 8 mg via ORAL
  Filled 2013-08-12: qty 1

## 2013-08-12 NOTE — Progress Notes (Signed)
Ms. Califano rports nausea today.  She is receiving radiation therapy to her left breast and note mild hyperpigmentation of the breast and in the inframmary fold.  She denies any pain today, but reports nausea.  Given Zofran  8mg   OD given today as ordered by Dr. Isidore Moos. Informed Ms. Heick that sometimes a headache can occur with this medication and she stated understanding.  Will re-check her status on tomorrow.

## 2013-08-12 NOTE — Progress Notes (Signed)
   Weekly Management Note:  outpatient Current Dose:  36 Gy  Projected Dose: 60 Gy   Narrative:  The patient presents for routine under treatment assessment.  CBCT/MVCT images/Port film x-rays were reviewed.  The chart was checked. She is having some nausea today. She usually takes Phenergan at home. The nausea is not a daily issue for her. She is in quite a bit of back pain and recently she was told that she has a cyst in the lumbar spine causing pain as it presses on the nerve. She is undergoing an epidural tomorrow.   Physical Findings:  height is 5\' 6"  (1.676 m) and weight is 224 lb 9.6 oz (101.878 kg). Her temperature is 98.3 F (36.8 C). Her blood pressure is 134/88 and her pulse is 81.  moderate hyperpigmentation of her left breast.  Impression:  The patient is tolerating radiotherapy.  Plan:  Continue radiotherapy as planned.   ________________________________   Eppie Gibson, M.D.

## 2013-08-13 ENCOUNTER — Ambulatory Visit
Admission: RE | Admit: 2013-08-13 | Discharge: 2013-08-13 | Disposition: A | Discharge status: Home / Self Care | Payer: Medicare Other | Source: Ambulatory Visit | Attending: Radiation Oncology | Admitting: Radiation Oncology

## 2013-08-13 DIAGNOSIS — IMO0002 Reserved for concepts with insufficient information to code with codable children: Secondary | ICD-10-CM | POA: Diagnosis not present

## 2013-08-13 DIAGNOSIS — C50319 Malignant neoplasm of lower-inner quadrant of unspecified female breast: Secondary | ICD-10-CM | POA: Diagnosis not present

## 2013-08-13 DIAGNOSIS — Z510 Encounter for antineoplastic radiation therapy: Secondary | ICD-10-CM | POA: Diagnosis not present

## 2013-08-14 ENCOUNTER — Ambulatory Visit
Admission: RE | Admit: 2013-08-14 | Discharge: 2013-08-14 | Disposition: A | Discharge status: Home / Self Care | Payer: Medicare Other | Source: Ambulatory Visit | Attending: Radiation Oncology | Admitting: Radiation Oncology

## 2013-08-14 DIAGNOSIS — C50319 Malignant neoplasm of lower-inner quadrant of unspecified female breast: Secondary | ICD-10-CM | POA: Diagnosis not present

## 2013-08-14 DIAGNOSIS — Z510 Encounter for antineoplastic radiation therapy: Secondary | ICD-10-CM | POA: Diagnosis not present

## 2013-08-15 ENCOUNTER — Encounter: Payer: Self-pay | Admitting: *Deleted

## 2013-08-15 ENCOUNTER — Ambulatory Visit
Admission: RE | Admit: 2013-08-15 | Discharge: 2013-08-15 | Disposition: A | Discharge status: Home / Self Care | Payer: Medicare Other | Source: Ambulatory Visit | Attending: Radiation Oncology | Admitting: Radiation Oncology

## 2013-08-15 DIAGNOSIS — C50319 Malignant neoplasm of lower-inner quadrant of unspecified female breast: Secondary | ICD-10-CM | POA: Diagnosis not present

## 2013-08-15 DIAGNOSIS — Z510 Encounter for antineoplastic radiation therapy: Secondary | ICD-10-CM | POA: Diagnosis not present

## 2013-08-15 NOTE — Progress Notes (Signed)
[name] in today with report of Bright Red face on yesterday and now, redness of her anterior neck with associated nausea.  She reports that she received an epidural for a cyst in the L5 region on Tuesday of this week and noted the bright red face on yesterday(Wednesday).  She is currently receiving radiation therapy to her left breast and this is not a reaction to treatment.  Called [facility] and relayed message related to above and was informed that [name] would be called today. Requested they give her at least 30 minutes to drive home before calling.   Encouraged Ms [name] [name] to take Phenergan went she returns home and she stated she would.

## 2013-08-16 ENCOUNTER — Ambulatory Visit
Admission: RE | Admit: 2013-08-16 | Discharge: 2013-08-16 | Disposition: A | Discharge status: Home / Self Care | Payer: Medicare Other | Source: Ambulatory Visit | Attending: Radiation Oncology | Admitting: Radiation Oncology

## 2013-08-16 ENCOUNTER — Encounter: Payer: Self-pay | Admitting: Radiation Oncology

## 2013-08-16 DIAGNOSIS — Z510 Encounter for antineoplastic radiation therapy: Secondary | ICD-10-CM | POA: Diagnosis not present

## 2013-08-16 DIAGNOSIS — C50319 Malignant neoplasm of lower-inner quadrant of unspecified female breast: Secondary | ICD-10-CM | POA: Diagnosis not present

## 2013-08-19 ENCOUNTER — Ambulatory Visit
Admission: RE | Admit: 2013-08-19 | Discharge: 2013-08-19 | Disposition: A | Discharge status: Home / Self Care | Payer: Medicare Other | Source: Ambulatory Visit | Attending: Radiation Oncology | Admitting: Radiation Oncology

## 2013-08-19 ENCOUNTER — Encounter: Payer: Self-pay | Admitting: Radiation Oncology

## 2013-08-19 VITALS — BP 135/67 | HR 89 | Temp 98.9°F | Ht 66.0 in | Wt 222.7 lb

## 2013-08-19 DIAGNOSIS — C50319 Malignant neoplasm of lower-inner quadrant of unspecified female breast: Secondary | ICD-10-CM | POA: Diagnosis not present

## 2013-08-19 DIAGNOSIS — Z510 Encounter for antineoplastic radiation therapy: Secondary | ICD-10-CM | POA: Diagnosis not present

## 2013-08-19 DIAGNOSIS — C50312 Malignant neoplasm of lower-inner quadrant of left female breast: Secondary | ICD-10-CM

## 2013-08-19 NOTE — Progress Notes (Signed)
   Weekly Management Note:  outpatient Current Dose:  46 Gy  Projected Dose: 60 Gy   Narrative:  The patient presents for routine under treatment assessment.  CBCT/MVCT images/Port film x-rays were reviewed.  The chart was checked. Will have boost which will start on 08/22/13.   She appears fatigued today. No longer has the facial flush that was present on last week. [place] informed her that this was a result of her Epidural  Physical Findings:  height is 5\' 6"  (1.676 m) and weight is 222 lb 11.2 oz (101.016 kg). Her temperature is 98.9 F (37.2 C). Her blood pressure is 135/67 and her pulse is 89.  skin is pink, hyperpigmented, and intact, over L breast/axilla  Impression:  The patient is tolerating radiotherapy.  Plan:  Continue radiotherapy as planned. I let the patient know that my partner, Dr. Valere Dross, will be covering for me next week during my vacation.  1 mo f/u card given.  ________________________________   Eppie Gibson, M.D.

## 2013-08-19 NOTE — Progress Notes (Signed)
[name] [name] has received 23/25 fractions to her whole breast.  Will have boost which will start on 08/22/13.  Note redness mixed with hyperpigmentation of her left breast.  Her skin is soft and intact, inclusive of her inframmary fold.  She appears fatigued today. No longer has the facial flush that was present on last week.  [place] informed her that this was a result of her Epidural on Tuesday of last week.

## 2013-08-20 ENCOUNTER — Ambulatory Visit
Admission: RE | Admit: 2013-08-20 | Discharge: 2013-08-20 | Disposition: A | Discharge status: Home / Self Care | Payer: Medicare Other | Source: Ambulatory Visit | Attending: Radiation Oncology | Admitting: Radiation Oncology

## 2013-08-20 DIAGNOSIS — Z510 Encounter for antineoplastic radiation therapy: Secondary | ICD-10-CM | POA: Diagnosis not present

## 2013-08-20 DIAGNOSIS — C50319 Malignant neoplasm of lower-inner quadrant of unspecified female breast: Secondary | ICD-10-CM | POA: Diagnosis not present

## 2013-08-21 ENCOUNTER — Ambulatory Visit
Admission: RE | Admit: 2013-08-21 | Discharge: 2013-08-21 | Disposition: A | Discharge status: Home / Self Care | Payer: Medicare Other | Source: Ambulatory Visit | Attending: Radiation Oncology | Admitting: Radiation Oncology

## 2013-08-21 DIAGNOSIS — C50319 Malignant neoplasm of lower-inner quadrant of unspecified female breast: Secondary | ICD-10-CM | POA: Diagnosis not present

## 2013-08-21 DIAGNOSIS — Z510 Encounter for antineoplastic radiation therapy: Secondary | ICD-10-CM | POA: Diagnosis not present

## 2013-08-22 ENCOUNTER — Ambulatory Visit
Admission: RE | Admit: 2013-08-22 | Discharge: 2013-08-22 | Disposition: A | Discharge status: Home / Self Care | Payer: Medicare Other | Source: Ambulatory Visit | Attending: Radiation Oncology | Admitting: Radiation Oncology

## 2013-08-22 DIAGNOSIS — C50319 Malignant neoplasm of lower-inner quadrant of unspecified female breast: Secondary | ICD-10-CM | POA: Diagnosis not present

## 2013-08-22 DIAGNOSIS — Z510 Encounter for antineoplastic radiation therapy: Secondary | ICD-10-CM | POA: Diagnosis not present

## 2013-08-23 ENCOUNTER — Ambulatory Visit
Admission: RE | Admit: 2013-08-23 | Discharge: 2013-08-23 | Disposition: A | Discharge status: Home / Self Care | Payer: Medicare Other | Source: Ambulatory Visit | Attending: Radiation Oncology | Admitting: Radiation Oncology

## 2013-08-23 ENCOUNTER — Ambulatory Visit: Payer: Medicare Other | Admitting: Oncology

## 2013-08-23 DIAGNOSIS — C50319 Malignant neoplasm of lower-inner quadrant of unspecified female breast: Secondary | ICD-10-CM | POA: Diagnosis not present

## 2013-08-23 DIAGNOSIS — Z510 Encounter for antineoplastic radiation therapy: Secondary | ICD-10-CM | POA: Diagnosis not present

## 2013-08-26 ENCOUNTER — Ambulatory Visit
Admission: RE | Admit: 2013-08-26 | Discharge: 2013-08-26 | Disposition: A | Discharge status: Home / Self Care | Payer: Medicare Other | Source: Ambulatory Visit | Attending: Radiation Oncology | Admitting: Radiation Oncology

## 2013-08-26 ENCOUNTER — Encounter: Payer: Self-pay | Admitting: Radiation Oncology

## 2013-08-26 VITALS — BP 123/73 | HR 68 | Temp 98.6°F | Ht 66.0 in | Wt 229.4 lb

## 2013-08-26 DIAGNOSIS — C50319 Malignant neoplasm of lower-inner quadrant of unspecified female breast: Secondary | ICD-10-CM | POA: Diagnosis not present

## 2013-08-26 DIAGNOSIS — Z510 Encounter for antineoplastic radiation therapy: Secondary | ICD-10-CM | POA: Diagnosis not present

## 2013-08-26 DIAGNOSIS — C50312 Malignant neoplasm of lower-inner quadrant of left female breast: Secondary | ICD-10-CM

## 2013-08-26 NOTE — Progress Notes (Signed)
[name] [name] has received 3/5 fractions to the boost field.  She now has a moist desquamation in the left inframmary fold and she reports pain at a level 5/10.  Anterior breast red, but skin intact.

## 2013-08-26 NOTE — Progress Notes (Signed)
Weekly Management Note:  Site: Left breast Current Dose:  5600  cGy Projected Dose: 6000  cGy  Narrative: The patient is seen today for routine under treatment assessment. CBCT/MVCT images/port films were reviewed. The chart was reviewed.   She does have discomfort from desquamation along her inframammary region. She has been using Radioplex gel.  Physical Examination:  Filed Vitals:   08/26/13 1328  BP: 123/73  Pulse: 68  Temp: 98.6 F (37 C)  .  Weight: 229 lb 6.4 oz (104.055 kg). There is moderate erythema along the left breast with moist desquamation along the inframammary region. There is dry desquamation elsewhere.  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 She was given hydrogel pads to use when necessary. She will finish her treatment this Thursday.  Plan: Continue radiation therapy as planned. One-month followup visit with Dr Isidore Moos after completion of radiation therapy this Thursday.

## 2013-08-27 ENCOUNTER — Ambulatory Visit
Admission: RE | Admit: 2013-08-27 | Discharge: 2013-08-27 | Disposition: A | Discharge status: Home / Self Care | Payer: Medicare Other | Source: Ambulatory Visit | Attending: Radiation Oncology | Admitting: Radiation Oncology

## 2013-08-27 DIAGNOSIS — C50319 Malignant neoplasm of lower-inner quadrant of unspecified female breast: Secondary | ICD-10-CM | POA: Diagnosis not present

## 2013-08-27 DIAGNOSIS — Z510 Encounter for antineoplastic radiation therapy: Secondary | ICD-10-CM | POA: Diagnosis not present

## 2013-08-28 ENCOUNTER — Ambulatory Visit
Admission: RE | Admit: 2013-08-28 | Discharge: 2013-08-28 | Disposition: A | Discharge status: Home / Self Care | Payer: Medicare Other | Source: Ambulatory Visit | Attending: Radiation Oncology | Admitting: Radiation Oncology

## 2013-08-28 ENCOUNTER — Encounter: Payer: Self-pay | Admitting: Radiation Oncology

## 2013-08-28 DIAGNOSIS — Z510 Encounter for antineoplastic radiation therapy: Secondary | ICD-10-CM | POA: Diagnosis not present

## 2013-08-28 DIAGNOSIS — C50319 Malignant neoplasm of lower-inner quadrant of unspecified female breast: Secondary | ICD-10-CM | POA: Diagnosis not present

## 2013-08-31 NOTE — Progress Notes (Signed)
  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 Also, she developed dry desquamation elsewhere.  Plan: The patient has completed radiation treatment. Topical agents given to help her skin. The patient will return to radiation oncology clinic for routine followup in one month. I advised them to call or return sooner if they have any questions or concerns related to their recovery or treatment.  -----------------------------------  Eppie Gibson, MD

## 2013-09-03 DIAGNOSIS — IMO0002 Reserved for concepts with insufficient information to code with codable children: Secondary | ICD-10-CM | POA: Diagnosis not present

## 2013-09-12 ENCOUNTER — Other Ambulatory Visit: Payer: Self-pay | Admitting: Physician Assistant

## 2013-09-12 ENCOUNTER — Encounter: Payer: Self-pay | Admitting: Physician Assistant

## 2013-09-12 ENCOUNTER — Ambulatory Visit (INDEPENDENT_AMBULATORY_CARE_PROVIDER_SITE_OTHER): Payer: Medicare Other | Admitting: Physician Assistant

## 2013-09-12 VITALS — BP 132/68 | HR 68 | Temp 97.9°F | Resp 16 | Wt 226.0 lb

## 2013-09-12 DIAGNOSIS — I10 Essential (primary) hypertension: Secondary | ICD-10-CM

## 2013-09-12 DIAGNOSIS — E559 Vitamin D deficiency, unspecified: Secondary | ICD-10-CM

## 2013-09-12 DIAGNOSIS — E669 Obesity, unspecified: Secondary | ICD-10-CM

## 2013-09-12 DIAGNOSIS — Z79899 Other long term (current) drug therapy: Secondary | ICD-10-CM

## 2013-09-12 DIAGNOSIS — E785 Hyperlipidemia, unspecified: Secondary | ICD-10-CM | POA: Diagnosis not present

## 2013-09-12 DIAGNOSIS — R7303 Prediabetes: Secondary | ICD-10-CM

## 2013-09-12 DIAGNOSIS — R7309 Other abnormal glucose: Secondary | ICD-10-CM | POA: Diagnosis not present

## 2013-09-12 DIAGNOSIS — E538 Deficiency of other specified B group vitamins: Secondary | ICD-10-CM | POA: Diagnosis not present

## 2013-09-12 DIAGNOSIS — F341 Dysthymic disorder: Secondary | ICD-10-CM | POA: Diagnosis not present

## 2013-09-12 DIAGNOSIS — C50312 Malignant neoplasm of lower-inner quadrant of left female breast: Secondary | ICD-10-CM

## 2013-09-12 DIAGNOSIS — D509 Iron deficiency anemia, unspecified: Secondary | ICD-10-CM | POA: Diagnosis not present

## 2013-09-12 LAB — CBC WITH DIFFERENTIAL/PLATELET
Basophils Absolute: 0.1 K/uL (ref 0.0–0.1)
Basophils Relative: 1 % (ref 0–1)
Eosinophils Absolute: 0.2 K/uL (ref 0.0–0.7)
Eosinophils Relative: 2 % (ref 0–5)
HCT: 29.7 % — ABNORMAL LOW (ref 36.0–46.0)
Hemoglobin: 9.7 g/dL — ABNORMAL LOW (ref 12.0–15.0)
Lymphocytes Relative: 21 % (ref 12–46)
Lymphs Abs: 1.6 K/uL (ref 0.7–4.0)
MCH: 26.9 pg (ref 26.0–34.0)
MCHC: 32.7 g/dL (ref 30.0–36.0)
MCV: 82.3 fL (ref 78.0–100.0)
Monocytes Absolute: 0.6 K/uL (ref 0.1–1.0)
Monocytes Relative: 8 % (ref 3–12)
Neutro Abs: 5.3 K/uL (ref 1.7–7.7)
Neutrophils Relative %: 68 % (ref 43–77)
Platelets: 309 K/uL (ref 150–400)
RBC: 3.61 MIL/uL — ABNORMAL LOW (ref 3.87–5.11)
RDW: 15.4 % (ref 11.5–15.5)
WBC: 7.8 K/uL (ref 4.0–10.5)

## 2013-09-12 MED ORDER — AZITHROMYCIN 250 MG PO TABS: ORAL_TABLET | ORAL | Status: AC

## 2013-09-12 MED ORDER — TEMAZEPAM 30 MG PO CAPS: ORAL_CAPSULE | ORAL | Status: DC

## 2013-09-12 MED ORDER — CIPROFLOXACIN HCL 500 MG PO TABS: 500.0000 mg | ORAL_TABLET | Freq: Two times a day (BID) | ORAL | Status: AC

## 2013-09-12 NOTE — Patient Instructions (Signed)
Use a dropper to put olive oil or canola oil in the effected ear- 2-3 times a week. Let it soak for 20-30 min then you can take a shower or use a baby bulb with warm water to wash out the ear wax.  Do not use Qtips  If you are traveling you can take these medications to be more prepared. If you get chest pain, shortness of breath or abdominal pain please go to the hospital wherever you may be.   Ciprofloxacin is good for travelers diarrhea, you can take 2 pills a day for 7 days. Or it is also good for urinary tract infections, you can take 2 a day for 7 days.  Zpak- is good for sinus infections please finish as prescribed Phenergran is for nausea but it sedating so plan on eating and sleeping.  Levsin is good for nausea, diarrhea, or abdominal cramping- it can constipate you so don't take too much. This dissolves under your tongue.  Prednisone is good for joint pain or rashes or spider bites- you can take it as prescribed but if you are feeling better you can stop it early.     Bad carbs also include fruit juice, alcohol, and sweet tea. These are empty calories that do not signal to your brain that you are full.   Please remember the good carbs are still carbs which convert into sugar. So please measure them out no more than 1/2-1 cup of rice, oatmeal, pasta, and beans.  Veggies are however free foods! Pile them on.   I like lean protein at every meal such as chicken, [place], pork chops, cottage cheese, etc. Just do not fry these meats and please center your meal around vegetable, the meats should be a side dish.   No all fruit is created equal. Please see the list below, the fruit at the bottom is higher in sugars than the fruit at the top

## 2013-09-12 NOTE — Progress Notes (Signed)
Assessment and Plan:  Hypertension: Continue medication, monitor blood pressure at home. Continue DASH diet. Cholesterol: Continue diet and exercise. Check cholesterol.  Pre-diabetes-Continue diet and exercise. Check A1C Vitamin D Def- check level and continue medications.  Back pain- cont follow up Dr. [name] Travel- sending in travel medications with directions on use  Continue diet and meds as discussed. Further disposition pending results of labs.  HPI 68 y.o. female  presents for 3 month follow up with hypertension, hyperlipidemia, prediabetes and vitamin D. Her blood pressure has been controlled at home, today their BP is BP: 132/68 mmHg She does not workout. She denies chest pain, shortness of breath, dizziness.  She is on cholesterol medication and denies myalgias. Her cholesterol is at goal. The cholesterol last visit was:   Lab Results  Component Value Date   CHOL 215* 06/11/2013   HDL 47 06/11/2013   LDLCALC 129* 06/11/2013   TRIG 193* 06/11/2013   CHOLHDL 4.6 06/11/2013   She has been working on diet and exercise for prediabetes, and denies nausea, paresthesia of the feet and polydipsia. Last A1C in the office was:  Lab Results  Component Value Date   HGBA1C 6.3* 06/11/2013   Patient is on Vitamin D supplement.   She was diagnosed with left breast cancer in Dec had a lumpectomy and has been doing radiation.  She has been having lower back pain with radiation down her left leg, she had an MRI of her lower back that showed a cyst causing mass effect and root impingement. She is going on vacation on the 13th and is planning on having surgery with Dr. [name] when she returns. She has had 2 epidural and is having another epidural Tuesday.   Current Medications:  Current Outpatient Prescriptions on File Prior to Visit  Medication Sig Dispense Refill  . atorvastatin (LIPITOR) 80 MG tablet Take 80 mg by mouth daily. 1/2 TU TH SUN      . cholecalciferol (VITAMIN D) 1000 UNITS tablet Take  1,000 Units by mouth. 2      . cyclobenzaprine (FLEXERIL) 10 MG tablet Take 1 tablet (10 mg total) by mouth every 8 (eight) hours as needed for muscle spasms.  30 tablet  1  . doxazosin (CARDURA) 8 MG tablet TAKE 1/2 TABLET(4MG ) BY MOUTH EVERY DAY  45 tablet  3  . DULoxetine (CYMBALTA) 30 MG capsule Take 30 mg by mouth 3 (three) times daily.       Marland Kitchen gabapentin (NEURONTIN) 800 MG tablet 800 mg at bedtime. Take 1-or 1/2      . hyaluronate sodium (RADIAPLEXRX) GEL Apply 1 application topically 2 (two) times daily.      Marland Kitchen HYDROcodone-acetaminophen (NORCO) 5-325 MG per tablet 1/2-1 pill as needed for cough, pain, headache every 6 hours.  60 tablet  0  . meloxicam (MOBIC) 15 MG tablet Take 15 mg by mouth daily.      . non-metallic deodorant Jethro Poling) MISC Apply 1 application topically daily as needed.      Marland Kitchen omeprazole (PRILOSEC) 40 MG capsule Take 40 mg by mouth daily.      . promethazine (PHENERGAN) 25 MG tablet Take 1 tablet (25 mg total) by mouth every 6 (six) hours as needed for nausea or vomiting.  60 tablet  0  . temazepam (RESTORIL) 30 MG capsule TAKE 1 CAPSULE BY MOUTH AT BEDTIME  90 capsule  1  . traMADol (ULTRAM) 50 MG tablet Take 50 mg by mouth every 6 (six) hours as needed.  No current facility-administered medications on file prior to visit.   Medical History:  Past Medical History  Diagnosis Date  . Hypertension   . Arthritis   . Cataracts, bilateral   . Depression   . Anxiety   . GERD (gastroesophageal reflux disease)   . Hyperlipidemia   . Rib fracture   . Shingles   . Anemia   . Allergy   . Pre-diabetes   . Obese   . Gallstones   . Wears glasses   . PONV (postoperative nausea and vomiting)   . Breast cancer      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   Allergies:  Allergies  Allergen Reactions  . Requip [Ropinirole Hcl] Shortness Of Breath and Nausea And Vomiting  . Codeine Other (See Comments)    fatigue  . Minocycline   . Morphine  Sulfate     Palpitations  . Tetracyclines & Related     Hives  . Zestril [Lisinopril] Cough  . Zetia [Ezetimibe] Hives  . Amoxicillin Rash  . Sulfa Antibiotics Other (See Comments)    headache     Review of Systems: [X]  = complains of  [ ]  = denies  General: Fatigue [ ]  Fever [ ]  Chills [ ]  Weakness [ ]   Insomnia [ ]  Eyes: Redness [ ]  Blurred vision [ ]  Diplopia [ ]   ENT: Congestion [ ]  Sinus Pain [ ]  Post Nasal Drip [ ]  Sore Throat [ ]  Earache [ ]   Cardiac: Chest pain/pressure [ ]  SOB [ ]  Orthopnea [ ]   Palpitations [ ]   Paroxysmal nocturnal dyspnea[ ]  Claudication [ ]  Edema [ ]   Pulmonary: Cough [ ]  Wheezing[ ]   SOB [ ]   Snoring [ ]   GI: Nausea [ ]  Vomiting[ ]  Dysphagia[ ]  Heartburn[ ]  Abdominal pain [ ]  Constipation [ ] ; Diarrhea [ ] ; BRBPR [ ]  Melena[ ]  GU: Hematuria[ ]  Dysuria [ ]  Nocturia[ ]  Urgency [ ]   Hesitancy [ ]  Discharge [ ]  Neuro: Headaches[ ]  Vertigo[ ]  Paresthesias[ ]  Spasm [ ]  Speech changes [ ]  Incoordination [ ]   Ortho: Arthritis [ ]  Joint pain [ ]  Muscle pain Valu.Nieves ] Joint swelling [ ]  Back Pain SEEING DR.GRAVES [ X] Skin:  Rash FROM RADIATION ON HER CHEST Valu.Nieves ]  Pruritis [ ]  Change in skin lesion [ ]   Psych: Depression[ ]  Anxiety[ ]  Confusion [ ]  Memory loss [ ]   Heme/Lypmh: Bleeding [ ]  Bruising [ ]  Enlarged lymph nodes [ ]   Endocrine: Visual blurring [ ]  Paresthesia [ ]  Polyuria [ ]  Polydypsea [ ]    Heat/cold intolerance [ ]  Hypoglycemia [ ]   Family history- Review and unchanged Social history- Review and unchanged Physical Exam: BP 132/68  Pulse 68  Temp(Src) 97.9 F (36.6 C)  Resp 16  Wt 226 lb (102.513 kg) Wt Readings from Last 3 Encounters:  09/12/13 226 lb (102.513 kg)  08/26/13 229 lb 6.4 oz (104.055 kg)  08/19/13 222 lb 11.2 oz (101.016 kg)   General Appearance: Well nourished, in no apparent distress. Eyes: PERRLA, EOMs, conjunctiva no swelling or erythema Sinuses: No Frontal/maxillary tenderness ENT/Mouth: Ext aud canals clear, TMs without  erythema, bulging. No erythema, swelling, or exudate on post pharynx.  Tonsils not swollen or erythematous. Hearing normal.  Neck: Supple, thyroid normal.  Respiratory: Respiratory effort normal, BS equal bilaterally without rales, rhonchi, wheezing or stridor.  Cardio: RRR with no MRGs. Brisk peripheral pulses without edema.  Abdomen: Soft, + BS.  Non tender, no  guarding, rebound, hernias, masses. Lymphatics: Non tender without lymphadenopathy.  Musculoskeletal: antalgic gait, decrease reflexes/ROM/ and strength in left leg secondary to pain Skin: Warm, dry without rashes, lesions, ecchymosis.  Neuro: Cranial nerves intact. Normal muscle tone, no cerebellar symptoms. Sensation intact.  Psych: Awake and oriented X 3, normal affect, Insight and Judgment appropriate.    Vicie Mutters 2:51 PM

## 2013-09-13 LAB — TSH: TSH: 2.309 u[IU]/mL (ref 0.350–4.500)

## 2013-09-13 LAB — IRON AND TIBC
%SAT: 13 % — ABNORMAL LOW (ref 20–55)
Iron: 61 ug/dL (ref 42–145)
TIBC: 461 ug/dL (ref 250–470)
UIBC: 400 ug/dL (ref 125–400)

## 2013-09-13 LAB — HEPATIC FUNCTION PANEL
ALBUMIN: 4.1 g/dL (ref 3.5–5.2)
ALK PHOS: 92 U/L (ref 39–117)
ALT: 15 U/L (ref 0–35)
AST: 15 U/L (ref 0–37)
Bilirubin, Direct: <0.1 mg/dL (ref 0.0–0.3)
TOTAL PROTEIN: 7 g/dL (ref 6.0–8.3)
Total Bilirubin: 0.3 mg/dL (ref 0.2–1.2)

## 2013-09-13 LAB — VITAMIN B12: Vitamin B-12: 359 pg/mL (ref 211–911)

## 2013-09-13 LAB — LIPID PANEL
Cholesterol: 246 mg/dL — ABNORMAL HIGH (ref 0–200)
HDL: 53 mg/dL (ref >39)
LDL Cholesterol: 166 mg/dL — ABNORMAL HIGH (ref 0–99)
Total CHOL/HDL Ratio: 4.6 Ratio
Triglycerides: 137 mg/dL (ref <150)
VLDL: 27 mg/dL (ref 0–40)

## 2013-09-13 LAB — BASIC METABOLIC PANEL WITH GFR
BUN: 21 mg/dL (ref 6–23)
CO2: 27 mEq/L (ref 19–32)
CREATININE: 0.86 mg/dL (ref 0.50–1.10)
Calcium: 9.3 mg/dL (ref 8.4–10.5)
Chloride: 100 mEq/L (ref 96–112)
GFR, EST NON AFRICAN AMERICAN: 70 mL/min
GFR, Est African American: 81 mL/min
Glucose, Bld: 88 mg/dL (ref 70–99)
Potassium: 4.1 mEq/L (ref 3.5–5.3)
Sodium: 135 mEq/L (ref 135–145)

## 2013-09-13 LAB — HEMOGLOBIN A1C
Hgb A1c MFr Bld: 6.3 % — ABNORMAL HIGH (ref <5.7)
Mean Plasma Glucose: 134 mg/dL — ABNORMAL HIGH (ref <117)

## 2013-09-13 LAB — MAGNESIUM: MAGNESIUM: 2 mg/dL (ref 1.5–2.5)

## 2013-09-13 LAB — FERRITIN: Ferritin: 20 ng/mL (ref 10–291)

## 2013-09-15 ENCOUNTER — Other Ambulatory Visit: Payer: Self-pay | Admitting: Oncology

## 2013-09-16 ENCOUNTER — Telehealth: Payer: Self-pay

## 2013-09-16 ENCOUNTER — Telehealth: Payer: Self-pay | Admitting: Oncology

## 2013-09-16 NOTE — Telephone Encounter (Signed)
returned pt call and s.w pt and r/s appt per pt requst...pt aware of new d.t

## 2013-09-16 NOTE — Telephone Encounter (Signed)
Message copied by Baruch Merl on Mon Sep 16, 2013 11:50 AM ------      Message from: Gordy Levan      Created: Sun Sep 15, 2013 12:20 PM       If possible, please call Dr Marcy Siren office before her apt on 6-10, to get copy of bone density scan if he has had it done, or let me know if none has been done.      thanks ------

## 2013-09-16 NOTE — Telephone Encounter (Signed)
Caren Griffins reviewed patient's chart back to 2007 and no bone density scan has been done.

## 2013-09-17 DIAGNOSIS — IMO0002 Reserved for concepts with insufficient information to code with codable children: Secondary | ICD-10-CM | POA: Diagnosis not present

## 2013-09-18 ENCOUNTER — Ambulatory Visit: Payer: Medicare Other | Admitting: Oncology

## 2013-09-30 ENCOUNTER — Encounter: Payer: Self-pay | Admitting: Radiation Oncology

## 2013-10-01 ENCOUNTER — Telehealth: Payer: Self-pay | Admitting: Oncology

## 2013-10-01 DIAGNOSIS — IMO0002 Reserved for concepts with insufficient information to code with codable children: Secondary | ICD-10-CM | POA: Diagnosis not present

## 2013-10-01 NOTE — Telephone Encounter (Signed)
RETURNED PT'S CALL RE CX 6/24 APPTS. FORMER kk PT TO [facility]. PER PT SHE IS NOT FEELING WELL AND WILL CALL BACK TO R/S. MESSAGE FORWARDED TO [facility] RE APPT W/SQUIRE 6/24 - PT AWARE.

## 2013-10-02 ENCOUNTER — Ambulatory Visit: Payer: Medicare Other | Admitting: Oncology

## 2013-10-02 ENCOUNTER — Other Ambulatory Visit: Payer: Self-pay | Admitting: Orthopedic Surgery

## 2013-10-02 ENCOUNTER — Ambulatory Visit: Admission: RE | Admit: 2013-10-02 | Payer: Medicare Other | Source: Ambulatory Visit | Admitting: Radiation Oncology

## 2013-10-02 HISTORY — DX: Personal history of irradiation: Z92.3

## 2013-10-04 ENCOUNTER — Other Ambulatory Visit: Payer: Self-pay | Admitting: Physician Assistant

## 2013-10-04 ENCOUNTER — Encounter: Payer: Self-pay | Admitting: Physician Assistant

## 2013-10-04 ENCOUNTER — Telehealth: Payer: Self-pay

## 2013-10-04 ENCOUNTER — Encounter (HOSPITAL_COMMUNITY): Payer: Self-pay | Admitting: Pharmacy Technician

## 2013-10-04 NOTE — Telephone Encounter (Signed)
Patient just got back from vacation and was given Rx for prednisone. Patient c/o chills & not feeling well. Is it okay for patient to take Prednisone? Please advise

## 2013-10-07 ENCOUNTER — Ambulatory Visit (INDEPENDENT_AMBULATORY_CARE_PROVIDER_SITE_OTHER): Payer: Medicare Other | Admitting: Physician Assistant

## 2013-10-07 ENCOUNTER — Encounter: Payer: Self-pay | Admitting: Physician Assistant

## 2013-10-07 VITALS — BP 138/70 | HR 100 | Temp 97.7°F | Resp 16 | Wt 220.0 lb

## 2013-10-07 DIAGNOSIS — J0100 Acute maxillary sinusitis, unspecified: Secondary | ICD-10-CM | POA: Diagnosis not present

## 2013-10-07 DIAGNOSIS — H6120 Impacted cerumen, unspecified ear: Secondary | ICD-10-CM

## 2013-10-07 DIAGNOSIS — H6121 Impacted cerumen, right ear: Secondary | ICD-10-CM

## 2013-10-07 MED ORDER — LEVOFLOXACIN 500 MG PO TABS: 500.0000 mg | ORAL_TABLET | Freq: Every day | ORAL | Status: DC

## 2013-10-07 MED ORDER — DEXAMETHASONE SODIUM PHOSPHATE 10 MG/ML IJ SOLN
10.0000 mg | Freq: Once | INTRAMUSCULAR | Status: AC
  Administered 2013-10-07: 10 mg via INTRAMUSCULAR

## 2013-10-07 MED ORDER — PREDNISONE 20 MG PO TABS: ORAL_TABLET | ORAL | Status: DC

## 2013-10-07 NOTE — Pre-Procedure Instructions (Signed)
[name] [name]  10/07/2013   Your procedure is scheduled on:  Wednesday, July 8th  Report to [facility] at 0630 AM.  Call this number if you have problems the morning of surgery: [phone]   Remember:   Do not eat food or drink liquids after midnight.   Take these medicines the morning of surgery with A SIP OF WATER: cymbalta, neurontin, prilosec, pain medication if needed, nasonex if needed   Do not wear jewelry, make-up or nail polish.  Do not wear lotions, powders, or perfumes. You may wear deodorant.  Do not shave 48 hours prior to surgery. Men may shave face and neck.  Do not bring valuables to the hospital.  [facility] is not responsible  for any belongings or valuables.               Contacts, dentures or bridgework may not be worn into surgery.  Leave suitcase in the car. After surgery it may be brought to your room.  For patients admitted to the hospital, discharge time is determined by your  treatment team.               Patients discharged the day of surgery will not be allowed to drive home.  Please read over the following fact sheets that you were given: Pain Booklet, Coughing and Deep Breathing, Blood Transfusion Information, MRSA Information and Surgical Site Infection Prevention [facility] - Preparing for Surgery  Before surgery, you can play an important role.  Because skin is not sterile, your skin needs to be as free of germs as possible.  You can reduce the number of germs on you skin by washing with CHG (chlorahexidine gluconate) soap before surgery.  CHG is an antiseptic cleaner which kills germs and bonds with the skin to continue killing germs even after washing.  Please DO NOT use if you have an allergy to CHG or antibacterial soaps.  If your skin becomes reddened/irritated stop using the CHG and inform your nurse when you arrive at Short Stay.  Do not shave (including legs and underarms) for at least 48 hours prior to the first CHG  shower.  You may shave your face.  Please follow these instructions carefully:   1.  Shower with CHG Soap the night before surgery and the morning of Surgery.  2.  If you choose to wash your hair, wash your hair first as usual with your normal shampoo.  3.  After you shampoo, rinse your hair and body thoroughly to remove the shampoo.  4.  Use CHG as you would any other liquid soap.  You can apply CHG directly to the skin and wash gently with scrungie or a clean washcloth.  5.  Apply the CHG Soap to your body ONLY FROM THE NECK DOWN.  Do not use on open wounds or open sores.  Avoid contact with your eyes, ears, mouth and genitals (private parts).  Wash genitals (private parts) with your normal soap.  6.  Wash thoroughly, paying special attention to the area where your surgery will be performed.  7.  Thoroughly rinse your body with warm water from the neck down.  8.  DO NOT shower/wash with your normal soap after using and rinsing off the CHG Soap.  9.  Pat yourself dry with a clean towel.            10.  Wear clean pajamas.  11.  Place clean sheets on your bed the night of your first shower and do not sleep with pets.  Day of Surgery  Do not apply any lotions/deoderants the morning of surgery.  Please wear clean clothes to the hospital/surgery center.

## 2013-10-07 NOTE — Patient Instructions (Addendum)
Can get on Magnesium 250mg  - 400mg  a day to help prevent constipation after surgery  Sinusitis Sinusitis is redness, soreness, and swelling (inflammation) of the paranasal sinuses. Paranasal sinuses are air pockets within the bones of your face (beneath the eyes, the middle of the forehead, or above the eyes). In healthy paranasal sinuses, mucus is able to drain out, and air is able to circulate through them by way of your nose. However, when your paranasal sinuses are inflamed, mucus and air can become trapped. This can allow bacteria and other germs to grow and cause infection. Sinusitis can develop quickly and last only a short time (acute) or continue over a long period (chronic). Sinusitis that lasts for more than 12 weeks is considered chronic.  CAUSES  Causes of sinusitis include:  Allergies.  Structural abnormalities, such as displacement of the cartilage that separates your nostrils (deviated septum), which can decrease the air flow through your nose and sinuses and affect sinus drainage.  Functional abnormalities, such as when the small hairs (cilia) that line your sinuses and help remove mucus do not work properly or are not present. SYMPTOMS  Symptoms of acute and chronic sinusitis are the same. The primary symptoms are pain and pressure around the affected sinuses. Other symptoms include:  Upper toothache.  Earache.  Headache.  Bad breath.  Decreased sense of smell and taste.  A cough, which worsens when you are lying flat.  Fatigue.  Fever.  Thick drainage from your nose, which often is green and may contain pus (purulent).  Swelling and warmth over the affected sinuses. DIAGNOSIS  Your caregiver will perform a physical exam. During the exam, your caregiver may:  Look in your nose for signs of abnormal growths in your nostrils (nasal polyps).  Tap over the affected sinus to check for signs of infection.  View the inside of your sinuses (endoscopy) with a  special imaging device with a light attached (endoscope), which is inserted into your sinuses. If your caregiver suspects that you have chronic sinusitis, one or more of the following tests may be recommended:  Allergy tests.  Nasal culture--A sample of mucus is taken from your nose and sent to a lab and screened for bacteria.  Nasal cytology--A sample of mucus is taken from your nose and examined by your caregiver to determine if your sinusitis is related to an allergy. TREATMENT  Most cases of acute sinusitis are related to a viral infection and will resolve on their own within 10 days. Sometimes medicines are prescribed to help relieve symptoms (pain medicine, decongestants, nasal steroid sprays, or saline sprays).  However, for sinusitis related to a bacterial infection, your caregiver will prescribe antibiotic medicines. These are medicines that will help kill the bacteria causing the infection.  Rarely, sinusitis is caused by a fungal infection. In theses cases, your caregiver will prescribe antifungal medicine. For some cases of chronic sinusitis, surgery is needed. Generally, these are cases in which sinusitis recurs more than 3 times per year, despite other treatments. HOME CARE INSTRUCTIONS   Drink plenty of water. Water helps thin the mucus so your sinuses can drain more easily.  Use a humidifier.  Inhale steam 3 to 4 times a day (for example, sit in the bathroom with the shower running).  Apply a warm, moist washcloth to your face 3 to 4 times a day, or as directed by your caregiver.  Use saline nasal sprays to help moisten and clean your sinuses.  Take over-the-counter or prescription medicines  for pain, discomfort, or fever only as directed by your caregiver. SEEK IMMEDIATE MEDICAL CARE IF:  You have increasing pain or severe headaches.  You have nausea, vomiting, or drowsiness.  You have swelling around your face.  You have vision problems.  You have a stiff  neck.  You have difficulty breathing. MAKE SURE YOU:   Understand these instructions.  Will watch your condition.  Will get help right away if you are not doing well or get worse. Document Released: 03/28/2005 Document Revised: 06/20/2011 Document Reviewed: 04/12/2011 [facility] Patient Information 2015 [place], [place]. This information is not intended to replace advice given to you by your health care provider. Make sure you discuss any questions you have with your health care provider.

## 2013-10-07 NOTE — Progress Notes (Signed)
[employer] representative Note  Diagnosis: Left Breast Cancer  The patient's CT images from her initial simulation were reviewed to plan her boost treatment to her left breast  lumpectomy cavity.  Measurements were made regarding the size and depth of the surgical bed. The boost to the lumpectomy cavity will be delivered with 15 MeV electrons prescribed to the 100% isodose line.   A special port plan was reviewed and approved. A custom electron cut-out will be used for her boost field.  10 Gy in 5 fractions prescribed.  -----------------------------------  Eppie Gibson, MD

## 2013-10-07 NOTE — Progress Notes (Signed)
   Subjective:    Patient ID: [name] [name], female    DOB: 1946/01/11, 68 y.o.   MRN: [id number]  URI  This is a new problem. Episode onset: 4-5 days. There has been no fever. Associated symptoms include congestion, coughing, ear pain (right ear), a plugged ear sensation, rhinorrhea, sinus pain and a sore throat. Pertinent negatives include no abdominal pain, chest pain, diarrhea, dysuria, headaches, joint pain, joint swelling, nausea, neck pain, rash, sneezing, swollen glands, vomiting or wheezing. She has tried antihistamine and decongestant (zpak) for the symptoms.    Review of Systems  HENT: Positive for congestion, ear pain (right ear), rhinorrhea and sore throat. Negative for sneezing.   Respiratory: Positive for cough. Negative for wheezing.   Cardiovascular: Negative for chest pain.  Gastrointestinal: Negative for nausea, vomiting, abdominal pain and diarrhea.  Genitourinary: Negative for dysuria.  Musculoskeletal: Negative for joint pain and neck pain.  Skin: Negative for rash.  Neurological: Negative for headaches.       Objective:   Physical Exam        Assessment & Plan:  Sinus infection- levaquin 500, Dexamethasone shot 10mg  Cerumen impaction- irrigation.

## 2013-10-07 NOTE — Progress Notes (Signed)
Another chart

## 2013-10-08 ENCOUNTER — Encounter (HOSPITAL_COMMUNITY)
Admission: RE | Admit: 2013-10-08 | Discharge: 2013-10-08 | Disposition: A | Discharge status: Home / Self Care | Payer: Medicare Other | Source: Ambulatory Visit | Attending: Orthopedic Surgery | Admitting: Orthopedic Surgery

## 2013-10-08 ENCOUNTER — Encounter (HOSPITAL_COMMUNITY): Payer: Self-pay

## 2013-10-08 DIAGNOSIS — Z01812 Encounter for preprocedural laboratory examination: Secondary | ICD-10-CM | POA: Diagnosis not present

## 2013-10-08 HISTORY — DX: Diverticulosis of intestine, part unspecified, without perforation or abscess without bleeding: K57.90

## 2013-10-08 HISTORY — DX: Other chronic pain: G89.29

## 2013-10-08 HISTORY — DX: Personal history of peptic ulcer disease: Z87.11

## 2013-10-08 HISTORY — DX: Personal history of other diseases of the digestive system: Z87.19

## 2013-10-08 HISTORY — DX: Personal history of other infectious and parasitic diseases: Z86.19

## 2013-10-08 HISTORY — DX: Weakness: R53.1

## 2013-10-08 HISTORY — DX: Pain in unspecified joint: M25.50

## 2013-10-08 HISTORY — DX: Dorsalgia, unspecified: M54.9

## 2013-10-08 HISTORY — DX: Insomnia, unspecified: G47.00

## 2013-10-08 HISTORY — DX: Headache: R51

## 2013-10-08 HISTORY — DX: Other specified disorders of nose and nasal sinuses: J34.89

## 2013-10-08 LAB — COMPREHENSIVE METABOLIC PANEL
ALT: 16 U/L (ref 0–35)
AST: 16 U/L (ref 0–37)
Albumin: 3.8 g/dL (ref 3.5–5.2)
Alkaline Phosphatase: 99 U/L (ref 39–117)
BUN: 20 mg/dL (ref 6–23)
CALCIUM: 9.7 mg/dL (ref 8.4–10.5)
CO2: 21 meq/L (ref 19–32)
CREATININE: 0.75 mg/dL (ref 0.50–1.10)
Chloride: 100 mEq/L (ref 96–112)
GFR, EST NON AFRICAN AMERICAN: 86 mL/min — AB (ref >90)
GLUCOSE: 132 mg/dL — AB (ref 70–99)
Potassium: 3.9 mEq/L (ref 3.7–5.3)
Sodium: 142 mEq/L (ref 137–147)
Total Bilirubin: 0.2 mg/dL — ABNORMAL LOW (ref 0.3–1.2)
Total Protein: 8.1 g/dL (ref 6.0–8.3)

## 2013-10-08 LAB — CBC WITH DIFFERENTIAL/PLATELET
Basophils Absolute: 0 10*3/uL (ref 0.0–0.1)
Basophils Relative: 0 % (ref 0–1)
Eosinophils Absolute: 0 10*3/uL (ref 0.0–0.7)
Eosinophils Relative: 0 % (ref 0–5)
HEMATOCRIT: 34.7 % — AB (ref 36.0–46.0)
Hemoglobin: 11.4 g/dL — ABNORMAL LOW (ref 12.0–15.0)
LYMPHS PCT: 6 % — AB (ref 12–46)
Lymphs Abs: 0.6 10*3/uL — ABNORMAL LOW (ref 0.7–4.0)
MCH: 27.7 pg (ref 26.0–34.0)
MCHC: 32.9 g/dL (ref 30.0–36.0)
MCV: 84.4 fL (ref 78.0–100.0)
MONOS PCT: 9 % (ref 3–12)
Monocytes Absolute: 0.9 10*3/uL (ref 0.1–1.0)
Neutro Abs: 8.6 10*3/uL — ABNORMAL HIGH (ref 1.7–7.7)
Neutrophils Relative %: 85 % — ABNORMAL HIGH (ref 43–77)
Platelets: 401 10*3/uL — ABNORMAL HIGH (ref 150–400)
RBC: 4.11 MIL/uL (ref 3.87–5.11)
RDW: 15.6 % — ABNORMAL HIGH (ref 11.5–15.5)
WBC: 10.1 10*3/uL (ref 4.0–10.5)

## 2013-10-08 LAB — URINALYSIS, ROUTINE W REFLEX MICROSCOPIC
Bilirubin Urine: NEGATIVE
GLUCOSE, UA: NEGATIVE mg/dL
Hgb urine dipstick: NEGATIVE
KETONES UR: NEGATIVE mg/dL
Leukocytes, UA: NEGATIVE
Nitrite: NEGATIVE
PH: 6 (ref 5.0–8.0)
Protein, ur: NEGATIVE mg/dL
SPECIFIC GRAVITY, URINE: 1.018 (ref 1.005–1.030)
Urobilinogen, UA: 0.2 mg/dL (ref 0.0–1.0)

## 2013-10-08 LAB — SURGICAL PCR SCREEN
MRSA, PCR: NEGATIVE
Staphylococcus aureus: NEGATIVE

## 2013-10-08 LAB — ABO/RH: ABO/RH(D): A POS

## 2013-10-08 LAB — APTT: aPTT: 24 seconds (ref 24–37)

## 2013-10-08 LAB — PROTIME-INR
INR: 0.94 (ref 0.00–1.49)
Prothrombin Time: 12.6 seconds (ref 11.6–15.2)

## 2013-10-08 MED ORDER — POVIDONE-IODINE 7.5 % EX SOLN: Freq: Once | CUTANEOUS | Status: DC

## 2013-10-08 NOTE — Progress Notes (Signed)
Pt doesn't have a cardiologist  Stress test done 62yrs ago  Echo report in epic from   Denies ever having a heart cath   Medical MD is Adult and Adolescent with Dr.William MCKeown but sees PA  EKG in epic from 05-31-13  CXR in epic from 03-28-13

## 2013-10-09 ENCOUNTER — Ambulatory Visit
Admission: RE | Admit: 2013-10-09 | Discharge: 2013-10-09 | Disposition: A | Discharge status: Home / Self Care | Payer: Medicare Other | Source: Ambulatory Visit | Attending: Radiation Oncology | Admitting: Radiation Oncology

## 2013-10-09 ENCOUNTER — Encounter: Payer: Self-pay | Admitting: Radiation Oncology

## 2013-10-09 DIAGNOSIS — C50312 Malignant neoplasm of lower-inner quadrant of left female breast: Secondary | ICD-10-CM

## 2013-10-09 HISTORY — PX: LUMBAR FUSION: SHX111

## 2013-10-09 NOTE — Progress Notes (Addendum)
[name] [name] here today for assessment s/p radiation therapy to her Left breast which completed on 08/28/13.  She denies any redness nor tanning of skin on the left breast, but reports tenderness and occasional shooting pains in this area.  Explained that the shooting pains are due to irritation of the nerve endings in her breast, but will gradually subside.   She reports that she does not have the desire to do anything at this time when questioned about her level of fatigue. Will have surgery for a disk fusion of her L4-5 vetebral bodies on 7/8/5 and she may be sent to rehabilitation which is causing her "distress".

## 2013-10-09 NOTE — Progress Notes (Signed)
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  She denies any redness nor tanning of skin on the left breast, but reports tenderness and occasional shooting pains in this area.  She reports that she does not have the desire to do anything at this time when questioned about her level of fatigue. Denies depression. Will have surgery for a disk fusion of her L4-5 vetebral bodies on 7/8/5 and she may be sent to rehabilitation which is causing her anxiety. The uncertainty of her recovery from back surgery is hard to think about.                             ALLERGIES:  is allergic to requip; codeine; minocycline; morphine sulfate; tetracyclines & related; zestril; zetia; amoxicillin; and sulfa antibiotics.  Meds: Current Outpatient Prescriptions  Medication Sig Dispense Refill  . atorvastatin (LIPITOR) 80 MG tablet Take 40 mg by mouth 3 (three) times a week. Tuesday, Thursday and Sunday      . Cholecalciferol (VITAMIN D) 2000 UNITS tablet Take 4,000 Units by mouth daily.      Marland Kitchen doxazosin (CARDURA) 8 MG tablet Take 4 mg by mouth at bedtime.      . DULoxetine (CYMBALTA) 30 MG capsule Take 90 mg by mouth daily.       Marland Kitchen gabapentin (NEURONTIN) 800 MG tablet Take 800 mg by mouth 3 (three) times daily.       Marland Kitchen levofloxacin (LEVAQUIN) 500 MG tablet Take 1 tablet (500 mg total) by mouth daily.  10 tablet  0  .  mometasone (NASONEX) 50 MCG/ACT nasal spray Place 1 spray into both nostrils 2 (two) times daily as needed (sinusitis).      Marland Kitchen omeprazole (PRILOSEC) 40 MG capsule Take 40 mg by mouth daily.      Marland Kitchen oxyCODONE-acetaminophen (ENDOCET) 5-325 MG per tablet Take 1 tablet by mouth every 6 (six) hours as needed for severe pain.      Marland Kitchen temazepam (RESTORIL) 30 MG capsule Take 30 mg by mouth at bedtime.      . traMADol (ULTRAM) 50 MG tablet Take 50 mg by mouth 4 (four) times daily as needed for moderate pain (headaches).       . verapamil (CALAN-SR) 240 MG CR tablet Take 240 mg by mouth daily.      . predniSONE (DELTASONE) 20 MG tablet Take one pill two times daily for 3 days, take one pill daily for 4 days.  10 tablet  0   No current facility-administered medications for this encounter.    Physical Findings: The patient is in no acute distress. Patient is alert and oriented. Skin has healed nicely over left breast.    Vitals with Age-Percentiles 10/08/2013  Length 932.3 cm  Systolic 557  Diastolic 85  Pulse 322  Respiration 18  Weight 99.02 kg  BMI 35.3  VISIT  REPORT     Lab Findings: Lab Results  Component Value Date   WBC 10.1 10/08/2013   HGB 11.4* 10/08/2013   HCT 34.7* 10/08/2013   MCV 84.4 10/08/2013   PLT 401* 10/08/2013    Radiographic Findings: No results found.  Impression/Plan: Continue lotion or vitamin E cream over left breast for 1 month.  I encouraged her to continue with yearly mammography and followup with medical oncology. I will see her back on an as-needed basis. I have encouraged her to call if she has any issues or concerns in the future. I wished her the very best.  Since she doesn't have an appointment with med/onc (none made since Dr. Humphrey Rolls left) I contacted people in the dept to make an appt for her ASAP. She needs to discussed anti estrogen therapy with a provider, and would like to do so before her back surgery on 10/16/13.    _____________________________________   Eppie Gibson, MD

## 2013-10-10 ENCOUNTER — Telehealth: Payer: Self-pay | Admitting: *Deleted

## 2013-10-10 ENCOUNTER — Other Ambulatory Visit: Payer: Self-pay | Admitting: Internal Medicine

## 2013-10-10 NOTE — Telephone Encounter (Signed)
Received referral from Dr. Isidore Moos to schedule pt before 7/8 due to pt having back surgery.  Got with Mendel Ryder and she gave me the ok to schedule her for 7/6.  Called pt and confirmed 10/14/13 appt w/ her.  Emailed Dr. Isidore Moos to make her aware.

## 2013-10-14 ENCOUNTER — Encounter: Payer: Self-pay | Admitting: Adult Health

## 2013-10-14 ENCOUNTER — Telehealth: Payer: Self-pay | Admitting: Oncology

## 2013-10-14 ENCOUNTER — Ambulatory Visit (HOSPITAL_BASED_OUTPATIENT_CLINIC_OR_DEPARTMENT_OTHER): Payer: BC Managed Care – PPO | Admitting: Adult Health

## 2013-10-14 VITALS — BP 139/81 | HR 103 | Temp 98.4°F | Resp 18 | Ht 66.0 in | Wt 223.9 lb

## 2013-10-14 DIAGNOSIS — C50919 Malignant neoplasm of unspecified site of unspecified female breast: Secondary | ICD-10-CM | POA: Diagnosis not present

## 2013-10-14 DIAGNOSIS — Z170 Estrogen receptor positive status [ER+]: Secondary | ICD-10-CM | POA: Diagnosis not present

## 2013-10-14 DIAGNOSIS — C50312 Malignant neoplasm of lower-inner quadrant of left female breast: Secondary | ICD-10-CM

## 2013-10-14 MED ORDER — ANASTROZOLE 1 MG PO TABS: 1.0000 mg | ORAL_TABLET | Freq: Every day | ORAL | Status: DC

## 2013-10-14 NOTE — Progress Notes (Signed)
[name] [name] [id number] 05-18-1945 68 y.o. 10/14/2013 9:20 PM  CC  MCKEOWN,WILLIAM DAVID, MD [place] [address] Dr. Rolm Bookbinder Dr. Eppie Gibson  DIAGNOSIS: 68 year old female with new diagnosis of left breast cancer (T1 N0)  STAGE:  Cancer of lower-inner quadrant of left female breast   Primary site: Breast (Left)   Staging method: AJCC 7th Edition   Pathologic: Stage IA (T1c, N0, cM0) signed by Deatra Robinson, MD on 06/27/2013 12:21 AM   Summary: Stage IA (T1c, N0, cM0)    ONCOLOGIC HISTORY:  [name] [name] is a 68 y.o. female with  1.  A past medical history significant for anxiety GERD shingles anemia and hypertension. Patient also has a family history significant for breast cancer in a sister. Patient underwent a screening mammogram that showed a breast density category C. She was noted to have a mass in the lower inner quadrant of the left breast with spiculated margins and associated microcalcifications. This measures 10 mm. She had an ultrasound performed that showed irregular hypoechoic mass at the 9:00 position 67 cm from the nipple. This measured 5 x 4 x 5 mm. She had a core needle biopsy performed that revealed invasive lobular carcinoma, grade 2, tumor was estrogen receptor positive progesterone receptor positive HER-2/neu negative with a proliferation marker Ki-67 14%. Patient was seen by Dr. Rolm Bookbinder on 05/22/2013 to discuss surgical treatment options. Since then she underwent a left breast radioactive seed guided lumpectomy and axillary sentinel lymph node biopsy. Her final pathology did reveal a 1.8 cm, grade 1, invasive lobular carcinoma Sentinel node was negative for metastatic disease all margins were clear. Tumor again was ER positive PR positive HER-2/neu negative. She overall tolerated the procedure well.  2.  Patient underwent adjuvant radiation therapy under the care of Dr. Isidore Moos from 07/18/13 through  08/28/13.  Past Medical History: Past Medical History  Diagnosis Date  . Arthritis   . Anxiety   . Hyperlipidemia     takes Atorvastatin 3 times a week  . Shingles   . Anemia   . Allergy     Nasonex daily as needed  . Wears glasses   . PONV (postoperative nausea and vomiting)   . 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 S/P radiation therapy 07/18/2013-08/28/2013    1) Left Breast / 50 Gy in 25 fractions/ 2) Left Breast Boost / 10 Gy in 5 fractions  . Hypertension     takes Cardura nightly and Verapamil daily  . History of shingles   . Sinus drainage     put on Levaquin yesterday if no better in 3 days will start prednisone  . Headache(784.0)     rare  . Weakness     tingling and numbness both hands and left leg  . Joint pain   . Chronic back pain     cyst sitting on L4-5;slipped disc  . GERD (gastroesophageal reflux disease)     takes Omeprazole daily  . History of gastric ulcer at age 53  . Diverticulosis   . Depression     takes Cymbalta daily  . Insomnia     takes Restoril nightly    Past Surgical History: Past Surgical History  Procedure Laterality Date  . Cataract extraction bilateral w/ anterior vitrectomy Bilateral   . Abdominal hysterectomy    . Cholecystectomy    . Knee arthroscopy Bilateral   . Tonsillectomy    .  Colonoscopy    . Upper gi endoscopy    . Breast surgery      lt lump-neg  . Dupuytren / palmar fasciotomy Bilateral 2007    x 3 to left and once to the right   . Tubal ligation    . Foot surgery Right   . Epidural injections      x 3    Family History: Family History  Problem Relation Age of Onset  . Colon polyps Neg Hx   . Esophageal cancer Neg Hx   . Rectal cancer Neg Hx   . Stomach cancer Neg Hx   . Heart attack Mother   . Stroke Mother   . COPD Father   . Hypertension Father     Social History History  Substance Use Topics  . Smoking status: Never Smoker   . Smokeless tobacco:  Never Used  . Alcohol Use: Yes     Comment: wine occasionally    Allergies: Allergies  Allergen Reactions  . Requip [Ropinirole Hcl] Shortness Of Breath and Nausea And Vomiting  . Codeine Other (See Comments)    fatigue  . Minocycline Hives  . Morphine Sulfate Other (See Comments)    Palpitations  . Tetracyclines & Related Hives    Pt doesn't remember a reaction  . Zestril [Lisinopril] Cough  . Zetia [Ezetimibe] Other (See Comments)    Muscle and joint pain  . Amoxicillin Rash    Pt doesn't remember a reaction  . Sulfa Antibiotics Other (See Comments)    Headache (pt states that a blood pressure medicine caused a severe headache but doesn't remember a reaction to sulfa)    Current Medications: Current Outpatient Prescriptions  Medication Sig Dispense Refill  . atorvastatin (LIPITOR) 80 MG tablet Take 40 mg by mouth 3 (three) times a week. Tuesday, Thursday and Sunday      . Cholecalciferol (VITAMIN D) 2000 UNITS tablet Take 4,000 Units by mouth daily.      Marland Kitchen doxazosin (CARDURA) 8 MG tablet Take 4 mg by mouth at bedtime.      . DULoxetine (CYMBALTA) 30 MG capsule Take 90 mg by mouth daily.       Marland Kitchen gabapentin (NEURONTIN) 800 MG tablet Take 800 mg by mouth 3 (three) times daily.       . mometasone (NASONEX) 50 MCG/ACT nasal spray Place 1 spray into both nostrils 2 (two) times daily as needed (sinusitis).      Marland Kitchen omeprazole (PRILOSEC) 40 MG capsule Take 40 mg by mouth daily.      Marland Kitchen oxyCODONE-acetaminophen (ENDOCET) 5-325 MG per tablet Take 1 tablet by mouth every 6 (six) hours as needed for severe pain.      Marland Kitchen temazepam (RESTORIL) 30 MG capsule Take 30 mg by mouth at bedtime.      . traMADol (ULTRAM) 50 MG tablet Take 50 mg by mouth 4 (four) times daily as needed for moderate pain (headaches).       . verapamil (CALAN-SR) 240 MG CR tablet TAKE 1 TABLET BY MOUTH EVERY DAY WITH FOOD  90 tablet  1  . anastrozole (ARIMIDEX) 1 MG tablet Take 1 tablet (1 mg total) by mouth daily.  30  tablet  3  . levofloxacin (LEVAQUIN) 500 MG tablet Take 1 tablet (500 mg total) by mouth daily.  10 tablet  0  . predniSONE (DELTASONE) 20 MG tablet Take one pill two times daily for 3 days, take one pill daily for 4 days.  10 tablet  0   No current facility-administered medications for this visit.    OB/GYN History: menarche at 66, menopause, HRT for 15 years, discontinue 2000, GxP1 at 67  REVIEW OF SYSTEMS: A 10 point review of systems was conducted and is otherwise negative except for what is noted above.    Health Maintenance  Mammogram: 03/2013 Colonoscopy: 03/19/2013 with 10 year f/u recommended Bone Density Scan: 1 year ago, at [facility] Pap Smear: s/p TAH/BSO in [place] Exam: 04/2013 Vitamin D Level: 04/2013 Lipid Panel: 04/2013   PHYSICAL EXAMINATION: Blood pressure 139/81, pulse 103, temperature 98.4 F (36.9 C), temperature source Oral, resp. rate 18, height 5' 6" (1.676 m), weight 223 lb 14.4 oz (101.56 kg). GENERAL: Patient is a well appearing female in no acute distress HEENT:  Sclerae anicteric.  Oropharynx clear and moist. No ulcerations or evidence of oropharyngeal candidiasis. Neck is supple.  NODES:  No cervical, supraclavicular, or axillary lymphadenopathy palpated.  BREAST EXAM:  Left breast s/p lumpectomy, no nodules, masses, right breast no nodules, masses, skin changes, benign bilateral breast exam.  LUNGS:  Clear to auscultation bilaterally.  No wheezes or rhonchi. HEART:  Regular rate and rhythm. No murmur appreciated. ABDOMEN:  Soft, nontender.  Positive, normoactive bowel sounds. No organomegaly palpated. MSK:  No focal spinal tenderness to palpation. Full range of motion bilaterally in the upper extremities. EXTREMITIES:  No peripheral edema.   SKIN:  Clear with no obvious rashes or skin changes. No nail dyscrasia. NEURO:  Nonfocal. Well oriented.  Appropriate affect.    Breasts: right breast normal without mass, skin or nipple changes or  axillary nodes, left breast normal without mass, skin or nipple changes or axillary nodes with healing surgical scar and some fullness in the axilla..   STUDIES/RESULTS: Nm Sentinel Node Inj-no Rpt (breast)  06/06/2013   CLINICAL DATA: left axillary snbx   Sulfur colloid was injected intradermally by the nuclear medicine  technologist for breast cancer sentinel node localization.    US Breast Surgical Specimen  06/06/2013   CLINICAL DATA:  Status post lumpectomy following seed localization.  EXAM: SPECIMEN RADIOGRAPH OF THE left BREAST  COMPARISON:  Previous exam(s)  FINDINGS: Status post excision of the left breast. Radioactive seed is present and is marked for pathology. The previously placed clip is also localized for pathology.  IMPRESSION: Specimen radiograph of the left breast.  No apparent complications   Electronically Signed   By: Shon Hale M.D.   On: 06/06/2013 14:13   Mm Lt Plc Breast Loc Dev   1st Lesion  Inc Mammo Guide  06/04/2013   CLINICAL DATA:  Patient presents for seed localization prior to left lumpectomy.  EXAM: MAMMOGRAPHIC GUIDED RADIOACTIVE SEED LOCALIZATION OF THE left BREAST  COMPARISON:  Previous exam(s)  FINDINGS: Patient presents for radioactive seed localization prior to lumpectomy. I met with the patient and we discussed the procedure of seed localization including benefits and alternatives. We discussed the high likelihood of a successful procedure. We discussed the risks of the procedure including infection, bleeding, tissue injury and further surgery. We discussed the low dose of radioactivity involved in the procedure. Informed, written consent was given.  The usual time-out protocol was performed immediately prior to the procedure.  Using mammographic guidance, sterile technique, 2% lidocaine and an I-125 radioactive seed, mass and clip in the medial portion of the left breast are localized using a medial approach. The follow-up mammogram images confirm the seed in  the expected location and are marked for  Dr. Donne Hazel.  Follow-up survey of the patient confirms presence of radioactive seed.  Order number of I-125 seed:  [id number].  Dose of I-125 seed:  0.25 mCi.  The patient tolerated the procedure well and was [facility]. She was given instructions regarding seed removal. Surgery is scheduled for 06/06/2013.  IMPRESSION: Radioactive seed localization of the left breast. No apparent complications.   Electronically Signed   By: Shon Hale M.D.   On: 06/04/2013 14:43     LABS:    Chemistry      Component Value Date/Time   NA 142 10/08/2013 1059   NA 141 06/21/2013 1242   K 3.9 10/08/2013 1059   K 3.8 06/21/2013 1242   CL 100 10/08/2013 1059   CO2 21 10/08/2013 1059   CO2 22 06/21/2013 1242   BUN 20 10/08/2013 1059   BUN 17.9 06/21/2013 1242   CREATININE 0.75 10/08/2013 1059   CREATININE 0.86 09/12/2013 1503   CREATININE 0.8 06/21/2013 1242      Component Value Date/Time   CALCIUM 9.7 10/08/2013 1059   CALCIUM 9.1 06/21/2013 1242   ALKPHOS 99 10/08/2013 1059   ALKPHOS 108 06/21/2013 1242   AST 16 10/08/2013 1059   AST 14 06/21/2013 1242   ALT 16 10/08/2013 1059   ALT 15 06/21/2013 1242   BILITOT 0.2* 10/08/2013 1059   BILITOT 0.33 06/21/2013 1242      Lab Results  Component Value Date   WBC 10.1 10/08/2013   HGB 11.4* 10/08/2013   HCT 34.7* 10/08/2013   MCV 84.4 10/08/2013   PLT 401* 10/08/2013     ASSESSMENT/PLAN    68 year old female with  #1 stage I (T1 N0) invasive lobular carcinoma of the left breast diagnosed on a routine screening mammogram. Patient is now status post lumpectomy with sentinel lymph node biopsy performed on 06/06/2013. The final pathology revealed 1.8 cm grade 1 invasive lobular carcinoma Sentinel node was negative. Tumor was ER positive PR positive HER-2/neu negative with a proliferation marker Ki-67 at 14%.  #2 Oncotype score was 11, putting her in the low risk category for distant recurrence.  She completed  adjuvant radiation on 08/28/13.    #3 I spent a great deal of time today discussing the patient's surgical pathology, and her oncotype score in detail.  I discussed anti-estrogen therapy with her in detail.  She is due to have a L1 spinal fusion on 10/16/13.  Due to her upcoming surgery, we will start her on Arimidex daily.  She will start this in the beginning of August.  I gave her detailed information about Arimidex in her AVS.  We will contact her PCP to get a copy of her recent bone density scan.  We also discussed survivorship today and I recommended a healthy diet, self breast exams, and exercise when she is able.    The patient will return in October, 2015 for labs and f/u with Dr. Jana Hakim.   She knows to call us in the interim for any questions or concerns.  We can certainly see her sooner if needed.  This plan was reviewed with Dr. Jana Hakim in detail.    I spent 25 minutes counseling the patient face to face.  The total time spent in the appointment was 30 minutes.  Minette Headland, [place] [phone] 10/14/2013, 9:20 PM

## 2013-10-14 NOTE — Patient Instructions (Signed)
Take Arimidex 1mg  daily beginning 11/09/2013.  Continue with healthy diet and self breast exams monthly.    Anastrozole tablets What is this medicine? ANASTROZOLE (an AS troe zole) is used to treat breast cancer in women who have gone through menopause. Some types of breast cancer depend on estrogen to grow, and this medicine can stop tumor growth by blocking estrogen production. This medicine may be used for other purposes; ask your health care provider or pharmacist if you have questions. COMMON BRAND NAME(S): Arimidex What should I tell my health care provider before I take this medicine? They need to know if you have any of these conditions: -liver disease -an unusual or allergic reaction to anastrozole, other medicines, foods, dyes, or preservatives -pregnant or trying to get pregnant -breast-feeding How should I use this medicine? Take this medicine by mouth with a glass of water. Follow the directions on the prescription label. You can take this medicine with or without food. Take your doses at regular intervals. Do not take your medicine more often than directed. Do not stop taking except on the advice of your doctor or health care professional. Talk to your pediatrician regarding the use of this medicine in children. Special care may be needed. Overdosage: If you think you have taken too much of this medicine contact a poison control center or emergency room at once. NOTE: This medicine is only for you. Do not share this medicine with others. What if I miss a dose? If you miss a dose, take it as soon as you can. If it is almost time for your next dose, take only that dose. Do not take double or extra doses. What may interact with this medicine? Do not take this medicine with any of the following medications: -female hormones, like estrogens or progestins and birth control pills This medicine may also interact with the following medications: -tamoxifen This list may not describe all  possible interactions. Give your health care provider a list of all the medicines, herbs, non-prescription drugs, or dietary supplements you use. Also tell them if you smoke, drink alcohol, or use illegal drugs. Some items may interact with your medicine. What should I watch for while using this medicine? Visit your doctor or health care professional for regular checks on your progress. Let your doctor or health care professional know about any unusual vaginal bleeding. Do not treat yourself for diarrhea, nausea, vomiting or other side effects. Ask your doctor or health care professional for advice. What side effects may I notice from receiving this medicine? Side effects that you should report to your doctor or health care professional as soon as possible: -allergic reactions like skin rash, itching or hives, swelling of the face, lips, or tongue -any new or unusual symptoms -breathing problems -chest pain -leg pain or swelling -vomiting Side effects that usually do not require medical attention (report to your doctor or health care professional if they continue or are bothersome): -back or bone pain -cough, or throat infection -diarrhea or constipation -dizziness -headache -hot flashes -loss of appetite -nausea -sweating -weakness and tiredness -weight gain This list may not describe all possible side effects. Call your doctor for medical advice about side effects. You may report side effects to FDA at 1-800-FDA-1088. Where should I keep my medicine? Keep out of the reach of children. Store at room temperature between 20 and 25 degrees C (68 and 77 degrees F). Throw away any unused medicine after the expiration date. NOTE: This sheet is a summary.  It may not cover all possible information. If you have questions about this medicine, talk to your doctor, pharmacist, or health care provider.  2015, Elsevier/Gold Standard. (2007-06-08 16:31:52)  Breast Self-Awareness Practicing breast  self-awareness may pick up problems early, prevent significant medical complications, and possibly save your life. By practicing breast self-awareness, you can become familiar with how your breasts look and feel and if your breasts are changing. This allows you to notice changes early. It can also offer you some reassurance that your breast health is good. One way to learn what is normal for your breasts and whether your breasts are changing is to do a breast self-exam. If you find a lump or something that was not present in the past, it is best to contact your caregiver right away. Other findings that should be evaluated by your caregiver include nipple discharge, especially if it is bloody; skin changes or reddening; areas where the skin seems to be pulled in (retracted); or new lumps and bumps. Breast pain is seldom associated with cancer (malignancy), but should also be evaluated by a caregiver. HOW TO PERFORM A BREAST SELF-EXAM The best time to examine your breasts is 5-7 days after your menstrual period is over. During menstruation, the breasts are lumpier, and it may be more difficult to pick up changes. If you do not menstruate, have reached menopause, or had your uterus removed (hysterectomy), you should examine your breasts at regular intervals, such as monthly. If you are breastfeeding, examine your breasts after a feeding or after using a breast pump. Breast implants do not decrease the risk for lumps or tumors, so continue to perform breast self-exams as recommended. Talk to your caregiver about how to determine the difference between the implant and breast tissue. Also, talk about the amount of pressure you should use during the exam. Over time, you will become more familiar with the variations of your breasts and more comfortable with the exam. A breast self-exam requires you to remove all your clothes above the waist. 1. Look at your breasts and nipples. Stand in front of a mirror in a room with  good lighting. With your hands on your hips, push your hands firmly downward. Look for a difference in shape, contour, and size from one breast to the other (asymmetry). Asymmetry includes puckers, dips, or bumps. Also, look for skin changes, such as reddened or scaly areas on the breasts. Look for nipple changes, such as discharge, dimpling, repositioning, or redness. 2. Carefully feel your breasts. This is best done either in the shower or tub while using soapy water or when flat on your back. Place the arm (on the side of the breast you are examining) above your head. Use the pads (not the fingertips) of your three middle fingers on your opposite hand to feel your breasts. Start in the underarm area and use  inch (2 cm) overlapping circles to feel your breast. Use 3 different levels of pressure (light, medium, and firm pressure) at each circle before moving to the next circle. The light pressure is needed to feel the tissue closest to the skin. The medium pressure will help to feel breast tissue a little deeper, while the firm pressure is needed to feel the tissue close to the ribs. Continue the overlapping circles, moving downward over the breast until you feel your ribs below your breast. Then, move one finger-width towards the center of the body. Continue to use the  inch (2 cm) overlapping circles to feel your  breast as you move slowly up toward the collar bone (clavicle) near the base of the neck. Continue the up and down exam using all 3 pressures until you reach the middle of the chest. Do this with each breast, carefully feeling for lumps or changes. 3.  Keep a written record with breast changes or normal findings for each breast. By writing this information down, you do not need to depend only on memory for size, tenderness, or location. Write down where you are in your menstrual cycle, if you are still menstruating. Breast tissue can have some lumps or thick tissue. However, see your caregiver if  you find anything that concerns you.  SEEK MEDICAL CARE IF:  You see a change in shape, contour, or size of your breasts or nipples.   You see skin changes, such as reddened or scaly areas on the breasts or nipples.   You have an unusual discharge from your nipples.   You feel a new lump or unusually thick areas.  Document Released: 03/28/2005 Document Revised: 03/14/2012 Document Reviewed: 07/13/2011 [facility] Patient Information 2015 [place], [place]. This information is not intended to replace advice given to you by your health care provider. Make sure you discuss any questions you have with your health care provider.

## 2013-10-15 MED ORDER — SODIUM CHLORIDE 0.9 % IV SOLN
1500.0000 mg | INTRAVENOUS | Status: AC
  Administered 2013-10-16: 1500 mg via INTRAVENOUS
  Filled 2013-10-15 (×2): qty 1500

## 2013-10-16 ENCOUNTER — Inpatient Hospital Stay (HOSPITAL_COMMUNITY): Payer: Medicare Other | Admitting: Anesthesiology

## 2013-10-16 ENCOUNTER — Inpatient Hospital Stay (HOSPITAL_COMMUNITY)
Admission: RE | Admit: 2013-10-16 | Discharge: 2013-10-22 | DRG: 459 | Disposition: A | Discharge status: Skilled Nursing Facility | Payer: Medicare Other | Source: Ambulatory Visit | Attending: Orthopedic Surgery | Admitting: Orthopedic Surgery

## 2013-10-16 ENCOUNTER — Inpatient Hospital Stay (HOSPITAL_COMMUNITY): Payer: Medicare Other

## 2013-10-16 ENCOUNTER — Encounter (HOSPITAL_COMMUNITY)
Admission: RE | Disposition: A | Discharge status: Skilled Nursing Facility | Payer: Medicare Other | Source: Ambulatory Visit | Attending: Orthopedic Surgery

## 2013-10-16 ENCOUNTER — Encounter (HOSPITAL_COMMUNITY): Payer: Medicare Other | Admitting: Anesthesiology

## 2013-10-16 ENCOUNTER — Encounter (HOSPITAL_COMMUNITY): Payer: Self-pay | Admitting: *Deleted

## 2013-10-16 DIAGNOSIS — M545 Low back pain, unspecified: Secondary | ICD-10-CM | POA: Diagnosis not present

## 2013-10-16 DIAGNOSIS — J189 Pneumonia, unspecified organism: Secondary | ICD-10-CM | POA: Diagnosis not present

## 2013-10-16 DIAGNOSIS — J9601 Acute respiratory failure with hypoxia: Secondary | ICD-10-CM

## 2013-10-16 DIAGNOSIS — M549 Dorsalgia, unspecified: Secondary | ICD-10-CM | POA: Diagnosis not present

## 2013-10-16 DIAGNOSIS — M8569 Other cyst of bone, multiple sites: Secondary | ICD-10-CM | POA: Diagnosis not present

## 2013-10-16 DIAGNOSIS — IMO0002 Reserved for concepts with insufficient information to code with codable children: Secondary | ICD-10-CM | POA: Diagnosis not present

## 2013-10-16 DIAGNOSIS — I471 Supraventricular tachycardia, unspecified: Secondary | ICD-10-CM | POA: Diagnosis not present

## 2013-10-16 DIAGNOSIS — E785 Hyperlipidemia, unspecified: Secondary | ICD-10-CM | POA: Diagnosis present

## 2013-10-16 DIAGNOSIS — R6510 Systemic inflammatory response syndrome (SIRS) of non-infectious origin without acute organ dysfunction: Secondary | ICD-10-CM | POA: Diagnosis not present

## 2013-10-16 DIAGNOSIS — M1990 Unspecified osteoarthritis, unspecified site: Secondary | ICD-10-CM | POA: Diagnosis not present

## 2013-10-16 DIAGNOSIS — E669 Obesity, unspecified: Secondary | ICD-10-CM

## 2013-10-16 DIAGNOSIS — R509 Fever, unspecified: Secondary | ICD-10-CM | POA: Diagnosis not present

## 2013-10-16 DIAGNOSIS — Q762 Congenital spondylolisthesis: Principal | ICD-10-CM

## 2013-10-16 DIAGNOSIS — M159 Polyosteoarthritis, unspecified: Secondary | ICD-10-CM | POA: Diagnosis not present

## 2013-10-16 DIAGNOSIS — D62 Acute posthemorrhagic anemia: Secondary | ICD-10-CM | POA: Diagnosis not present

## 2013-10-16 DIAGNOSIS — M5137 Other intervertebral disc degeneration, lumbosacral region: Secondary | ICD-10-CM | POA: Diagnosis not present

## 2013-10-16 DIAGNOSIS — Z79899 Other long term (current) drug therapy: Secondary | ICD-10-CM | POA: Diagnosis not present

## 2013-10-16 DIAGNOSIS — G96198 Other disorders of meninges, not elsewhere classified: Secondary | ICD-10-CM | POA: Diagnosis not present

## 2013-10-16 DIAGNOSIS — F3289 Other specified depressive episodes: Secondary | ICD-10-CM | POA: Diagnosis present

## 2013-10-16 DIAGNOSIS — J690 Pneumonitis due to inhalation of food and vomit: Secondary | ICD-10-CM | POA: Diagnosis not present

## 2013-10-16 DIAGNOSIS — J9600 Acute respiratory failure, unspecified whether with hypoxia or hypercapnia: Secondary | ICD-10-CM | POA: Diagnosis not present

## 2013-10-16 DIAGNOSIS — G4700 Insomnia, unspecified: Secondary | ICD-10-CM | POA: Diagnosis present

## 2013-10-16 DIAGNOSIS — R0602 Shortness of breath: Secondary | ICD-10-CM | POA: Diagnosis not present

## 2013-10-16 DIAGNOSIS — F411 Generalized anxiety disorder: Secondary | ICD-10-CM | POA: Diagnosis present

## 2013-10-16 DIAGNOSIS — M539 Dorsopathy, unspecified: Secondary | ICD-10-CM | POA: Diagnosis not present

## 2013-10-16 DIAGNOSIS — M6281 Muscle weakness (generalized): Secondary | ICD-10-CM | POA: Diagnosis not present

## 2013-10-16 DIAGNOSIS — Z923 Personal history of irradiation: Secondary | ICD-10-CM | POA: Diagnosis not present

## 2013-10-16 DIAGNOSIS — M79609 Pain in unspecified limb: Secondary | ICD-10-CM | POA: Diagnosis not present

## 2013-10-16 DIAGNOSIS — M4310 Spondylolisthesis, site unspecified: Secondary | ICD-10-CM | POA: Diagnosis not present

## 2013-10-16 DIAGNOSIS — C50919 Malignant neoplasm of unspecified site of unspecified female breast: Secondary | ICD-10-CM | POA: Diagnosis present

## 2013-10-16 DIAGNOSIS — Z6836 Body mass index (BMI) 36.0-36.9, adult: Secondary | ICD-10-CM

## 2013-10-16 DIAGNOSIS — R209 Unspecified disturbances of skin sensation: Secondary | ICD-10-CM | POA: Diagnosis not present

## 2013-10-16 DIAGNOSIS — K219 Gastro-esophageal reflux disease without esophagitis: Secondary | ICD-10-CM | POA: Diagnosis present

## 2013-10-16 DIAGNOSIS — F329 Major depressive disorder, single episode, unspecified: Secondary | ICD-10-CM | POA: Diagnosis present

## 2013-10-16 DIAGNOSIS — M5410 Radiculopathy, site unspecified: Secondary | ICD-10-CM | POA: Diagnosis present

## 2013-10-16 DIAGNOSIS — G9619 Other disorders of meninges, not elsewhere classified: Secondary | ICD-10-CM

## 2013-10-16 DIAGNOSIS — M48061 Spinal stenosis, lumbar region without neurogenic claudication: Secondary | ICD-10-CM | POA: Diagnosis present

## 2013-10-16 DIAGNOSIS — I10 Essential (primary) hypertension: Secondary | ICD-10-CM | POA: Diagnosis present

## 2013-10-16 DIAGNOSIS — Z4789 Encounter for other orthopedic aftercare: Secondary | ICD-10-CM | POA: Diagnosis not present

## 2013-10-16 SURGERY — POSTERIOR LUMBAR FUSION 1 LEVEL
Anesthesia: General | Site: Spine Lumbar

## 2013-10-16 MED ORDER — LIDOCAINE HCL (CARDIAC) 20 MG/ML IV SOLN
INTRAVENOUS | Status: DC | PRN
  Administered 2013-10-16: 20 mg via INTRAVENOUS

## 2013-10-16 MED ORDER — PROPOFOL 10 MG/ML IV BOLUS
INTRAVENOUS | Status: AC
  Filled 2013-10-16: qty 20

## 2013-10-16 MED ORDER — DOXAZOSIN MESYLATE 4 MG PO TABS
4.0000 mg | ORAL_TABLET | Freq: Every day | ORAL | Status: DC
  Administered 2013-10-16 – 2013-10-21 (×5): 4 mg via ORAL
  Filled 2013-10-16 (×7): qty 1

## 2013-10-16 MED ORDER — SODIUM CHLORIDE 0.9 % IV SOLN
INTRAVENOUS | Status: DC | PRN
  Administered 2013-10-16: 08:00:00 via INTRAVENOUS

## 2013-10-16 MED ORDER — LIDOCAINE HCL 4 % MT SOLN
OROMUCOSAL | Status: DC | PRN
  Administered 2013-10-16: 4 mL via TOPICAL

## 2013-10-16 MED ORDER — ALUM & MAG HYDROXIDE-SIMETH 200-200-20 MG/5ML PO SUSP: 30.0000 mL | Freq: Four times a day (QID) | ORAL | Status: DC | PRN

## 2013-10-16 MED ORDER — MIDAZOLAM HCL 2 MG/2ML IJ SOLN: 0.5000 mg | Freq: Once | INTRAMUSCULAR | Status: DC | PRN

## 2013-10-16 MED ORDER — MEPERIDINE HCL 25 MG/ML IJ SOLN: 6.2500 mg | INTRAMUSCULAR | Status: DC | PRN

## 2013-10-16 MED ORDER — ALBUMIN HUMAN 5 % IV SOLN
INTRAVENOUS | Status: DC | PRN
  Administered 2013-10-16: 11:00:00 via INTRAVENOUS

## 2013-10-16 MED ORDER — ONDANSETRON HCL 4 MG/2ML IJ SOLN
INTRAMUSCULAR | Status: AC
  Filled 2013-10-16: qty 2

## 2013-10-16 MED ORDER — MIDAZOLAM HCL 2 MG/2ML IJ SOLN
INTRAMUSCULAR | Status: AC
Start: 2013-10-16 — End: 2013-10-16
  Filled 2013-10-16: qty 2

## 2013-10-16 MED ORDER — HYDROMORPHONE HCL PF 1 MG/ML IJ SOLN
INTRAMUSCULAR | Status: AC
  Filled 2013-10-16: qty 1

## 2013-10-16 MED ORDER — VITAMIN D3 25 MCG (1000 UNIT) PO TABS
4000.0000 [IU] | ORAL_TABLET | Freq: Every day | ORAL | Status: DC
  Administered 2013-10-17 – 2013-10-22 (×6): 4000 [IU] via ORAL
  Filled 2013-10-16 (×6): qty 4

## 2013-10-16 MED ORDER — ONDANSETRON HCL 4 MG/2ML IJ SOLN: 4.0000 mg | INTRAMUSCULAR | Status: DC | PRN

## 2013-10-16 MED ORDER — FENTANYL 10 MCG/ML IV SOLN
INTRAVENOUS | Status: DC
  Administered 2013-10-16: 90 ug via INTRAVENOUS
  Administered 2013-10-16: 22:00:00 via INTRAVENOUS
  Administered 2013-10-16: 285 ug via INTRAVENOUS
  Administered 2013-10-16: 14:00:00 via INTRAVENOUS
  Administered 2013-10-16: 251.8 ug via INTRAVENOUS
  Administered 2013-10-17: 05:00:00 via INTRAVENOUS
  Administered 2013-10-17: 345 ug via INTRAVENOUS
  Administered 2013-10-17: 300 ug via INTRAVENOUS
  Filled 2013-10-16 (×3): qty 50

## 2013-10-16 MED ORDER — DULOXETINE HCL 60 MG PO CPEP
90.0000 mg | ORAL_CAPSULE | Freq: Every day | ORAL | Status: DC
  Administered 2013-10-17 – 2013-10-22 (×6): 90 mg via ORAL
  Filled 2013-10-16 (×6): qty 1

## 2013-10-16 MED ORDER — THROMBIN 20000 UNITS EX SOLR
CUTANEOUS | Status: AC
  Filled 2013-10-16: qty 20000

## 2013-10-16 MED ORDER — ARTIFICIAL TEARS OP OINT
TOPICAL_OINTMENT | OPHTHALMIC | Status: AC
  Filled 2013-10-16: qty 3.5

## 2013-10-16 MED ORDER — SENNOSIDES-DOCUSATE SODIUM 8.6-50 MG PO TABS: 1.0000 | ORAL_TABLET | Freq: Every evening | ORAL | Status: DC | PRN

## 2013-10-16 MED ORDER — ARTIFICIAL TEARS OP OINT
TOPICAL_OINTMENT | OPHTHALMIC | Status: DC | PRN
  Administered 2013-10-16: 1 via OPHTHALMIC

## 2013-10-16 MED ORDER — LIDOCAINE HCL (CARDIAC) 20 MG/ML IV SOLN
INTRAVENOUS | Status: AC
  Filled 2013-10-16: qty 10

## 2013-10-16 MED ORDER — SODIUM CHLORIDE 0.9 % IJ SOLN
INTRAMUSCULAR | Status: AC
  Filled 2013-10-16: qty 10

## 2013-10-16 MED ORDER — FLUTICASONE PROPIONATE 50 MCG/ACT NA SUSP
1.0000 | Freq: Every day | NASAL | Status: DC
  Administered 2013-10-17 – 2013-10-22 (×6): 1 via NASAL
  Filled 2013-10-16: qty 16

## 2013-10-16 MED ORDER — DIAZEPAM 5 MG PO TABS
5.0000 mg | ORAL_TABLET | Freq: Four times a day (QID) | ORAL | Status: DC | PRN
  Administered 2013-10-16 – 2013-10-17 (×4): 5 mg via ORAL
  Filled 2013-10-16 (×3): qty 1

## 2013-10-16 MED ORDER — GLYCOPYRROLATE 0.2 MG/ML IJ SOLN
INTRAMUSCULAR | Status: AC
  Filled 2013-10-16: qty 2

## 2013-10-16 MED ORDER — SODIUM CHLORIDE 0.9 % IV SOLN
INTRAVENOUS | Status: DC
  Administered 2013-10-16 – 2013-10-18 (×3): via INTRAVENOUS

## 2013-10-16 MED ORDER — SUCCINYLCHOLINE CHLORIDE 20 MG/ML IJ SOLN
INTRAMUSCULAR | Status: AC
  Filled 2013-10-16: qty 1

## 2013-10-16 MED ORDER — PROMETHAZINE HCL 25 MG/ML IJ SOLN: 6.2500 mg | INTRAMUSCULAR | Status: DC | PRN

## 2013-10-16 MED ORDER — EPHEDRINE SULFATE 50 MG/ML IJ SOLN
INTRAMUSCULAR | Status: AC
  Filled 2013-10-16: qty 1

## 2013-10-16 MED ORDER — NALOXONE HCL 0.4 MG/ML IJ SOLN
0.4000 mg | INTRAMUSCULAR | Status: DC | PRN
  Filled 2013-10-16: qty 1

## 2013-10-16 MED ORDER — OXYCODONE-ACETAMINOPHEN 5-325 MG PO TABS
1.0000 | ORAL_TABLET | ORAL | Status: DC | PRN
  Administered 2013-10-16 – 2013-10-17 (×4): 2 via ORAL
  Administered 2013-10-19 – 2013-10-20 (×6): 1 via ORAL
  Administered 2013-10-21 – 2013-10-22 (×6): 2 via ORAL
  Filled 2013-10-16: qty 1
  Filled 2013-10-16: qty 2
  Filled 2013-10-16: qty 1
  Filled 2013-10-16 (×3): qty 2
  Filled 2013-10-16: qty 1
  Filled 2013-10-16 (×2): qty 2
  Filled 2013-10-16 (×3): qty 1
  Filled 2013-10-16 (×4): qty 2

## 2013-10-16 MED ORDER — SODIUM CHLORIDE 0.9 % IJ SOLN
3.0000 mL | Freq: Two times a day (BID) | INTRAMUSCULAR | Status: DC
  Administered 2013-10-17 (×2): 3 mL via INTRAVENOUS

## 2013-10-16 MED ORDER — NEOSTIGMINE METHYLSULFATE 10 MG/10ML IV SOLN
INTRAVENOUS | Status: AC
  Filled 2013-10-16: qty 1

## 2013-10-16 MED ORDER — OXYCODONE HCL 5 MG/5ML PO SOLN: 5.0000 mg | Freq: Once | ORAL | Status: AC | PRN

## 2013-10-16 MED ORDER — SODIUM CHLORIDE 0.9 % IJ SOLN: 9.0000 mL | INTRAMUSCULAR | Status: DC | PRN

## 2013-10-16 MED ORDER — ONDANSETRON HCL 4 MG/2ML IJ SOLN
INTRAMUSCULAR | Status: DC | PRN
  Administered 2013-10-16: 4 mg via INTRAVENOUS

## 2013-10-16 MED ORDER — EPHEDRINE SULFATE 50 MG/ML IJ SOLN
INTRAMUSCULAR | Status: DC | PRN
  Administered 2013-10-16: 10 mg via INTRAVENOUS
  Administered 2013-10-16: 5 mg via INTRAVENOUS

## 2013-10-16 MED ORDER — FLEET ENEMA 7-19 GM/118ML RE ENEM: 1.0000 | ENEMA | Freq: Once | RECTAL | Status: AC | PRN

## 2013-10-16 MED ORDER — SUFENTANIL CITRATE 50 MCG/ML IV SOLN
INTRAVENOUS | Status: AC
  Filled 2013-10-16: qty 1

## 2013-10-16 MED ORDER — ANASTROZOLE 1 MG PO TABS
1.0000 mg | ORAL_TABLET | Freq: Every day | ORAL | Status: DC
  Administered 2013-10-17 – 2013-10-22 (×6): 1 mg via ORAL
  Filled 2013-10-16 (×6): qty 1

## 2013-10-16 MED ORDER — PROPOFOL 10 MG/ML IV BOLUS
INTRAVENOUS | Status: DC | PRN
  Administered 2013-10-16: 100 mg via INTRAVENOUS

## 2013-10-16 MED ORDER — SUFENTANIL CITRATE 50 MCG/ML IV SOLN
INTRAVENOUS | Status: DC | PRN
  Administered 2013-10-16: 10 ug via INTRAVENOUS
  Administered 2013-10-16: 20 ug via INTRAVENOUS
  Administered 2013-10-16: 5 ug via INTRAVENOUS
  Administered 2013-10-16: 10 ug via INTRAVENOUS
  Administered 2013-10-16 (×2): 5 ug via INTRAVENOUS
  Administered 2013-10-16 (×2): 10 ug via INTRAVENOUS
  Administered 2013-10-16 (×2): 5 ug via INTRAVENOUS

## 2013-10-16 MED ORDER — OXYCODONE HCL 5 MG PO TABS
ORAL_TABLET | ORAL | Status: AC
  Filled 2013-10-16: qty 1

## 2013-10-16 MED ORDER — DIAZEPAM 5 MG PO TABS
ORAL_TABLET | ORAL | Status: AC
  Filled 2013-10-16: qty 1

## 2013-10-16 MED ORDER — SODIUM CHLORIDE 0.9 % IJ SOLN: 3.0000 mL | INTRAMUSCULAR | Status: DC | PRN

## 2013-10-16 MED ORDER — TEMAZEPAM 15 MG PO CAPS
30.0000 mg | ORAL_CAPSULE | Freq: Every day | ORAL | Status: DC
  Administered 2013-10-16 – 2013-10-21 (×6): 30 mg via ORAL
  Filled 2013-10-16 (×6): qty 2

## 2013-10-16 MED ORDER — ONDANSETRON HCL 4 MG/2ML IJ SOLN
4.0000 mg | Freq: Four times a day (QID) | INTRAMUSCULAR | Status: DC | PRN
  Filled 2013-10-16: qty 2

## 2013-10-16 MED ORDER — 0.9 % SODIUM CHLORIDE (POUR BTL) OPTIME
TOPICAL | Status: DC | PRN
  Administered 2013-10-16 (×3): 1000 mL

## 2013-10-16 MED ORDER — ROCURONIUM BROMIDE 50 MG/5ML IV SOLN
INTRAVENOUS | Status: AC
  Filled 2013-10-16: qty 2

## 2013-10-16 MED ORDER — ROCURONIUM BROMIDE 100 MG/10ML IV SOLN
INTRAVENOUS | Status: DC | PRN
  Administered 2013-10-16: 10 mg via INTRAVENOUS
  Administered 2013-10-16: 20 mg via INTRAVENOUS
  Administered 2013-10-16: 10 mg via INTRAVENOUS
  Administered 2013-10-16: 30 mg via INTRAVENOUS

## 2013-10-16 MED ORDER — OXYCODONE HCL 5 MG PO TABS
5.0000 mg | ORAL_TABLET | Freq: Once | ORAL | Status: AC | PRN
  Administered 2013-10-16: 5 mg via ORAL

## 2013-10-16 MED ORDER — PHENOL 1.4 % MT LIQD: 1.0000 | OROMUCOSAL | Status: DC | PRN

## 2013-10-16 MED ORDER — GABAPENTIN 800 MG PO TABS
800.0000 mg | ORAL_TABLET | Freq: Three times a day (TID) | ORAL | Status: DC
  Administered 2013-10-16: 800 mg via ORAL
  Filled 2013-10-16 (×5): qty 1

## 2013-10-16 MED ORDER — ZOLPIDEM TARTRATE 5 MG PO TABS: 5.0000 mg | ORAL_TABLET | Freq: Every evening | ORAL | Status: DC | PRN

## 2013-10-16 MED ORDER — HYDROMORPHONE HCL PF 1 MG/ML IJ SOLN
0.2500 mg | INTRAMUSCULAR | Status: DC | PRN
  Administered 2013-10-16 (×4): 0.5 mg via INTRAVENOUS

## 2013-10-16 MED ORDER — HEMOSTATIC AGENTS (NO CHARGE) OPTIME
TOPICAL | Status: DC | PRN
  Administered 2013-10-16: 1 via TOPICAL

## 2013-10-16 MED ORDER — MIDAZOLAM HCL 5 MG/5ML IJ SOLN
INTRAMUSCULAR | Status: DC | PRN
  Administered 2013-10-16 (×2): 1 mg via INTRAVENOUS

## 2013-10-16 MED ORDER — METHYLENE BLUE 1 % INJ SOLN
INTRAMUSCULAR | Status: AC
  Filled 2013-10-16: qty 10

## 2013-10-16 MED ORDER — THROMBIN 20000 UNITS EX KIT
PACK | CUTANEOUS | Status: DC | PRN
  Administered 2013-10-16 (×2): 20000 [IU] via TOPICAL

## 2013-10-16 MED ORDER — MENTHOL 3 MG MT LOZG: 1.0000 | LOZENGE | OROMUCOSAL | Status: DC | PRN

## 2013-10-16 MED ORDER — DIPHENHYDRAMINE HCL 50 MG/ML IJ SOLN
12.5000 mg | Freq: Four times a day (QID) | INTRAMUSCULAR | Status: DC | PRN
  Filled 2013-10-16: qty 0.25

## 2013-10-16 MED ORDER — DOCUSATE SODIUM 100 MG PO CAPS
100.0000 mg | ORAL_CAPSULE | Freq: Two times a day (BID) | ORAL | Status: DC
  Administered 2013-10-16 – 2013-10-17 (×3): 100 mg via ORAL
  Filled 2013-10-16 (×5): qty 1

## 2013-10-16 MED ORDER — PROPOFOL INFUSION 10 MG/ML OPTIME
INTRAVENOUS | Status: DC | PRN
  Administered 2013-10-16: 50 ug/kg/min via INTRAVENOUS

## 2013-10-16 MED ORDER — ACETAMINOPHEN 325 MG PO TABS
650.0000 mg | ORAL_TABLET | ORAL | Status: DC | PRN
  Administered 2013-10-18 (×2): 650 mg via ORAL
  Filled 2013-10-16 (×2): qty 2

## 2013-10-16 MED ORDER — ATORVASTATIN CALCIUM 40 MG PO TABS
40.0000 mg | ORAL_TABLET | ORAL | Status: DC
  Administered 2013-10-17 – 2013-10-19 (×2): 40 mg via ORAL
  Filled 2013-10-16 (×3): qty 1

## 2013-10-16 MED ORDER — VANCOMYCIN HCL 10 G IV SOLR
1500.0000 mg | Freq: Once | INTRAVENOUS | Status: AC
  Administered 2013-10-16: 1500 mg via INTRAVENOUS
  Filled 2013-10-16: qty 1500

## 2013-10-16 MED ORDER — BUPIVACAINE-EPINEPHRINE 0.25% -1:200000 IJ SOLN
INTRAMUSCULAR | Status: DC | PRN
  Administered 2013-10-16: 25 mL

## 2013-10-16 MED ORDER — LACTATED RINGERS IV SOLN
INTRAVENOUS | Status: DC | PRN
  Administered 2013-10-16 (×2): via INTRAVENOUS

## 2013-10-16 MED ORDER — PANTOPRAZOLE SODIUM 40 MG PO TBEC
80.0000 mg | DELAYED_RELEASE_TABLET | Freq: Every day | ORAL | Status: DC
  Administered 2013-10-17 – 2013-10-22 (×6): 80 mg via ORAL
  Filled 2013-10-16 (×7): qty 2

## 2013-10-16 MED ORDER — BISACODYL 5 MG PO TBEC: 5.0000 mg | DELAYED_RELEASE_TABLET | Freq: Every day | ORAL | Status: DC | PRN

## 2013-10-16 MED ORDER — DIPHENHYDRAMINE HCL 12.5 MG/5ML PO ELIX
12.5000 mg | ORAL_SOLUTION | Freq: Four times a day (QID) | ORAL | Status: DC | PRN
  Filled 2013-10-16: qty 5

## 2013-10-16 MED ORDER — BUPIVACAINE-EPINEPHRINE (PF) 0.25% -1:200000 IJ SOLN
INTRAMUSCULAR | Status: AC
  Filled 2013-10-16: qty 30

## 2013-10-16 MED ORDER — ACETAMINOPHEN 650 MG RE SUPP: 650.0000 mg | RECTAL | Status: DC | PRN

## 2013-10-16 MED ORDER — VERAPAMIL HCL ER 240 MG PO TBCR
240.0000 mg | EXTENDED_RELEASE_TABLET | Freq: Every day | ORAL | Status: DC
  Administered 2013-10-17 – 2013-10-22 (×6): 240 mg via ORAL
  Filled 2013-10-16 (×6): qty 1

## 2013-10-16 MED ORDER — VITAMIN D 50 MCG (2000 UT) PO TABS: 4000.0000 [IU] | ORAL_TABLET | Freq: Every day | ORAL | Status: DC

## 2013-10-16 MED ORDER — SODIUM CHLORIDE 0.9 % IV SOLN: 250.0000 mL | INTRAVENOUS | Status: DC

## 2013-10-16 MED ORDER — LEVOFLOXACIN 500 MG PO TABS: 500.0000 mg | ORAL_TABLET | Freq: Every day | ORAL | Status: DC

## 2013-10-16 MED ORDER — HYDROMORPHONE HCL PF 1 MG/ML IJ SOLN
0.5000 mg | INTRAMUSCULAR | Status: DC | PRN
  Administered 2013-10-16: 1 mg via INTRAVENOUS
  Filled 2013-10-16: qty 1

## 2013-10-16 SURGICAL SUPPLY — 90 items
APL SKNCLS STERI-STRIP NONHPOA (GAUZE/BANDAGES/DRESSINGS) ×1
BENZOIN TINCTURE PRP APPL 2/3 (GAUZE/BANDAGES/DRESSINGS) ×3 IMPLANT
BLADE SURG ROTATE 9660 (MISCELLANEOUS) IMPLANT
BUR ROUND PRECISION 4.0 (BURR) ×2 IMPLANT
BUR ROUND PRECISION 4.0MM (BURR) ×1
CARTRIDGE OIL MAESTRO DRILL (MISCELLANEOUS) ×1 IMPLANT
CLOSURE STERI-STRIP 1/2X4 (GAUZE/BANDAGES/DRESSINGS) ×2
CLOSURE WOUND 1/2 X4 (GAUZE/BANDAGES/DRESSINGS) ×2
CLSR STERI-STRIP ANTIMIC 1/2X4 (GAUZE/BANDAGES/DRESSINGS) ×2 IMPLANT
CONT SPEC STER OR (MISCELLANEOUS) ×3 IMPLANT
CORDS BIPOLAR (ELECTRODE) ×3 IMPLANT
COVER MAYO STAND STRL (DRAPES) ×6 IMPLANT
COVER SURGICAL LIGHT HANDLE (MISCELLANEOUS) ×3 IMPLANT
DIFFUSER DRILL AIR PNEUMATIC (MISCELLANEOUS) ×3 IMPLANT
DRAIN CHANNEL 15F RND FF W/TCR (WOUND CARE) IMPLANT
DRAPE C-ARM 42X72 X-RAY (DRAPES) ×3 IMPLANT
DRAPE ORTHO SPLIT 77X108 STRL (DRAPES) ×3
DRAPE POUCH INSTRU U-SHP 10X18 (DRAPES) ×3 IMPLANT
DRAPE SURG 17X23 STRL (DRAPES) ×9 IMPLANT
DRAPE SURG ORHT 6 SPLT 77X108 (DRAPES) ×1 IMPLANT
DURAPREP 26ML APPLICATOR (WOUND CARE) ×3 IMPLANT
ELECT BLADE 4.0 EZ CLEAN MEGAD (MISCELLANEOUS) ×3
ELECT CAUTERY BLADE 6.4 (BLADE) ×3 IMPLANT
ELECT REM PT RETURN 9FT ADLT (ELECTROSURGICAL) ×3
ELECTRODE BLDE 4.0 EZ CLN MEGD (MISCELLANEOUS) ×1 IMPLANT
ELECTRODE REM PT RTRN 9FT ADLT (ELECTROSURGICAL) ×1 IMPLANT
EVACUATOR SILICONE 100CC (DRAIN) IMPLANT
GAUZE SPONGE 4X4 16PLY XRAY LF (GAUZE/BANDAGES/DRESSINGS) ×12 IMPLANT
GLOVE BIO SURGEON STRL SZ7 (GLOVE) ×3 IMPLANT
GLOVE BIO SURGEON STRL SZ8 (GLOVE) ×3 IMPLANT
GLOVE BIOGEL PI IND STRL 7.0 (GLOVE) ×1 IMPLANT
GLOVE BIOGEL PI IND STRL 8 (GLOVE) ×1 IMPLANT
GLOVE BIOGEL PI INDICATOR 7.0 (GLOVE) ×2
GLOVE BIOGEL PI INDICATOR 8 (GLOVE) ×2
GOWN STRL REUS W/ TWL LRG LVL3 (GOWN DISPOSABLE) ×2 IMPLANT
GOWN STRL REUS W/ TWL XL LVL3 (GOWN DISPOSABLE) ×1 IMPLANT
GOWN STRL REUS W/TWL LRG LVL3 (GOWN DISPOSABLE) ×6
GOWN STRL REUS W/TWL XL LVL3 (GOWN DISPOSABLE) ×3
IV CATH 14GX2 1/4 (CATHETERS) ×3 IMPLANT
KIT BASIN OR (CUSTOM PROCEDURE TRAY) ×3 IMPLANT
KIT POSITION SURG JACKSON T1 (MISCELLANEOUS) ×3 IMPLANT
KIT ROOM TURNOVER OR (KITS) ×3 IMPLANT
MARKER SKIN DUAL TIP RULER LAB (MISCELLANEOUS) IMPLANT
NDL ASP BONE MRW 11GX15 (NEEDLE) IMPLANT
NDL HYPO 25GX1X1/2 BEV (NEEDLE) ×1 IMPLANT
NDL SAFETY ECLIPSE 18X1.5 (NEEDLE) ×1 IMPLANT
NDL SPNL 18GX3.5 QUINCKE PK (NEEDLE) ×2 IMPLANT
NEEDLE 22X1 1/2 (OR ONLY) (NEEDLE) ×3 IMPLANT
NEEDLE ASP BONE MRW 11GX15 (NEEDLE) ×3 IMPLANT
NEEDLE BONE MARROW 8GX6 FENEST (NEEDLE) ×2 IMPLANT
NEEDLE HYPO 18GX1.5 SHARP (NEEDLE) ×3
NEEDLE HYPO 25GX1X1/2 BEV (NEEDLE) ×3 IMPLANT
NEEDLE SPNL 18GX3.5 QUINCKE PK (NEEDLE) ×6 IMPLANT
NEURO MONITORING STIM (LABOR (TRAVEL & OVERTIME)) ×2 IMPLANT
NS IRRIG 1000ML POUR BTL (IV SOLUTION) ×7 IMPLANT
OIL CARTRIDGE MAESTRO DRILL (MISCELLANEOUS) ×3
PACK LAMINECTOMY ORTHO (CUSTOM PROCEDURE TRAY) ×3 IMPLANT
PACK UNIVERSAL I (CUSTOM PROCEDURE TRAY) ×3 IMPLANT
PAD ARMBOARD 7.5X6 YLW CONV (MISCELLANEOUS) ×6 IMPLANT
PATTIES SURGICAL .5 X1 (DISPOSABLE) ×3 IMPLANT
PATTIES SURGICAL .5X1.5 (GAUZE/BANDAGES/DRESSINGS) ×3 IMPLANT
PUTTY DBX 1CC (Putty) ×3 IMPLANT
PUTTY DBX 1CC DEPUY (Putty) IMPLANT
ROD PRE BENT EXP 40MM (Rod) ×4 IMPLANT
SCREW EXPEDIUM POLYAXIAL 6X40 (Screw) ×12 IMPLANT
SCREW SET SINGLE INNER (Screw) ×10 IMPLANT
SHEET CONFORM 45LX20WX5H (Bone Implant) ×3 IMPLANT
SPONGE GAUZE 4X4 12PLY (GAUZE/BANDAGES/DRESSINGS) ×3 IMPLANT
SPONGE GAUZE 4X4 12PLY STER LF (GAUZE/BANDAGES/DRESSINGS) ×2 IMPLANT
SPONGE INTESTINAL PEANUT (DISPOSABLE) ×6 IMPLANT
SPONGE SURGIFOAM ABS GEL 100 (HEMOSTASIS) ×3 IMPLANT
STRIP CLOSURE SKIN 1/2X4 (GAUZE/BANDAGES/DRESSINGS) ×4 IMPLANT
SURGIFLO TRUKIT (HEMOSTASIS) IMPLANT
SUT BONE WAX W31G (SUTURE) ×2 IMPLANT
SUT MNCRL AB 4-0 PS2 18 (SUTURE) ×6 IMPLANT
SUT VIC AB 0 CT1 18XCR BRD 8 (SUTURE) ×1 IMPLANT
SUT VIC AB 0 CT1 8-18 (SUTURE) ×6
SUT VIC AB 1 CT1 18XCR BRD 8 (SUTURE) ×2 IMPLANT
SUT VIC AB 1 CT1 8-18 (SUTURE) ×6
SUT VIC AB 2-0 CT2 18 VCP726D (SUTURE) ×3 IMPLANT
SYR 20CC LL (SYRINGE) ×3 IMPLANT
SYR BULB IRRIGATION 50ML (SYRINGE) ×3 IMPLANT
SYR CONTROL 10ML LL (SYRINGE) ×6 IMPLANT
SYR TB 1ML LUER SLIP (SYRINGE) ×3 IMPLANT
TAPE CLOTH SURG 4X10 WHT LF (GAUZE/BANDAGES/DRESSINGS) ×2 IMPLANT
TOWEL OR 17X24 6PK STRL BLUE (TOWEL DISPOSABLE) ×3 IMPLANT
TOWEL OR 17X26 10 PK STRL BLUE (TOWEL DISPOSABLE) ×3 IMPLANT
TRAY FOLEY CATH 16FRSI W/METER (SET/KITS/TRAYS/PACK) ×3 IMPLANT
WATER STERILE IRR 1000ML POUR (IV SOLUTION) ×3 IMPLANT
YANKAUER SUCT BULB TIP NO VENT (SUCTIONS) ×5 IMPLANT

## 2013-10-16 NOTE — Progress Notes (Signed)
Utilization review completed.  

## 2013-10-16 NOTE — Transfer of Care (Signed)
Immediate Anesthesia Transfer of Care Note  Patient: [name] [name]  Procedure(s) Performed: Procedure(s) with comments: POSTERIOR LUMBAR FUSION 1 LEVEL (N/A) - Lumbar 4-5 posterior spinal fusion with instrumentation,allograft, autograft  Patient Location: PACU  Anesthesia Type:General  Level of Consciousness: awake, oriented, sedated and patient cooperative  Airway & Oxygen Therapy: Patient Spontanous Breathing and Patient connected to face mask oxygen  Post-op Assessment: Report given to PACU RN, Post -op Vital signs reviewed and stable and Patient moving all extremities  Post vital signs: Reviewed and stable  Complications: No apparent anesthesia complications

## 2013-10-16 NOTE — Progress Notes (Signed)
ANTIBIOTIC CONSULT NOTE - INITIAL  Pharmacy Consult for vancomcyin Indication: post-op prophylaxis  Allergies  Allergen Reactions  . Requip [Ropinirole Hcl] Shortness Of Breath and Nausea And Vomiting  . Codeine Other (See Comments)    fatigue  . Minocycline Hives  . Morphine Sulfate Other (See Comments)    Palpitations  . Tetracyclines & Related Hives    Pt doesn't remember a reaction  . Zestril [Lisinopril] Cough  . Zetia [Ezetimibe] Other (See Comments)    Muscle and joint pain  . Amoxicillin Rash    Pt doesn't remember a reaction  . Sulfa Antibiotics Other (See Comments)    Headache (pt states that a blood pressure medicine caused a severe headache but doesn't remember a reaction to sulfa)    Patient Measurements: Weight: 223 lb (101.152 kg) Adjusted Body Weight:   Vital Signs: Temp: 97.8 F (36.6 C) (07/08 1618) Temp src: Oral (07/08 0629) BP: 127/64 mmHg (07/08 1618) Pulse Rate: 96 (07/08 1618) Intake/Output from previous day:   Intake/Output from this shift: Total I/O In: 2250 [I.V.:2000; IV Piggyback:250] Out: 480 [Urine:180; Blood:300]  Labs: No results found for this basename: WBC, HGB, PLT, LABCREA, CREATININE,  in the last 72 hours The CrCl is unknown because both a height and weight (above a minimum accepted value) are required for this calculation. No results found for this basename: VANCOTROUGH, Corlis Leak, VANCORANDOM, GENTTROUGH, GENTPEAK, GENTRANDOM, TOBRATROUGH, TOBRAPEAK, TOBRARND, AMIKACINPEAK, AMIKACINTROU, AMIKACIN,  in the last 72 hours   Microbiology: Recent Results (from the past 720 hour(s))  SURGICAL PCR SCREEN     Status: None   Collection Time    10/08/13 10:59 AM      Result Value Ref Range Status   MRSA, PCR NEGATIVE  NEGATIVE Final   Staphylococcus aureus NEGATIVE  NEGATIVE Final   Comment:            The Xpert SA Assay (FDA     approved for NASAL specimens     in patients over 75 years of age),     is one component of     a  comprehensive surveillance     program.  Test performance has     been validated by [employer] for patients greater     than or equal to 31 year old.     It is not intended     to diagnose infection nor to     guide or monitor treatment.    Medical History: Past Medical History  Diagnosis Date  . Arthritis   . Anxiety   . Hyperlipidemia     takes Atorvastatin 3 times a week  . Shingles   . Anemia   . Allergy     Nasonex daily as needed  . Wears glasses   . PONV (postoperative nausea and vomiting)   . 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 S/P radiation therapy 07/18/2013-08/28/2013    1) Left Breast / 50 Gy in 25 fractions/ 2) Left Breast Boost / 10 Gy in 5 fractions  . Hypertension     takes Cardura nightly and Verapamil daily  . History of shingles   . Sinus drainage     put on Levaquin yesterday if no better in 3 days will start prednisone  . Headache(784.0)     rare  . Weakness     tingling and numbness both hands and left leg  . Joint pain   .  Chronic back pain     cyst sitting on L4-5;slipped disc  . GERD (gastroesophageal reflux disease)     takes Omeprazole daily  . History of gastric ulcer at age 81  . Diverticulosis   . Depression     takes Cymbalta daily  . Insomnia     takes Restoril nightly    Medications:  Prescriptions prior to admission  Medication Sig Dispense Refill  . anastrozole (ARIMIDEX) 1 MG tablet Take 1 tablet (1 mg total) by mouth daily.  30 tablet  3  . atorvastatin (LIPITOR) 80 MG tablet Take 40 mg by mouth 3 (three) times a week. Tuesday, Thursday and Sunday      . Cholecalciferol (VITAMIN D) 2000 UNITS tablet Take 4,000 Units by mouth daily.      Marland Kitchen doxazosin (CARDURA) 8 MG tablet Take 4 mg by mouth at bedtime.      . DULoxetine (CYMBALTA) 30 MG capsule Take 90 mg by mouth daily.       Marland Kitchen gabapentin (NEURONTIN) 800 MG tablet Take 800 mg by mouth 3 (three) times daily.       Marland Kitchen  levofloxacin (LEVAQUIN) 500 MG tablet Take 1 tablet (500 mg total) by mouth daily.  10 tablet  0  . mometasone (NASONEX) 50 MCG/ACT nasal spray Place 1 spray into both nostrils 2 (two) times daily as needed (sinusitis).      Marland Kitchen omeprazole (PRILOSEC) 40 MG capsule Take 40 mg by mouth daily.      Marland Kitchen oxyCODONE-acetaminophen (ENDOCET) 5-325 MG per tablet Take 1 tablet by mouth every 6 (six) hours as needed for severe pain.      Marland Kitchen temazepam (RESTORIL) 30 MG capsule Take 30 mg by mouth at bedtime.      . verapamil (CALAN-SR) 240 MG CR tablet TAKE 1 TABLET BY MOUTH EVERY DAY WITH FOOD  90 tablet  1  . predniSONE (DELTASONE) 20 MG tablet Take one pill two times daily for 3 days, take one pill daily for 4 days.  10 tablet  0  . traMADol (ULTRAM) 50 MG tablet Take 50 mg by mouth 4 (four) times daily as needed for moderate pain (headaches).         Assessment: 68 yo female POD #0 s/p lumbar fusion.  Initiating vancomycin for surgical prophylaxis.  Renal function is stable, Scr <1.  Currently no signs of infection, no leukocytosis, afebrile.   Goal of Therapy:  Infection prevention   Plan:  Vancomycin 1500 mg IV x1 Will discontinue after one dose as patient does not have any drains    Hughes Better, PharmD, BCPS Clinical Pharmacist Pager: [phone] 10/16/2013 5:01 PM

## 2013-10-16 NOTE — H&P (Signed)
PREOPERATIVE H&P  Chief Complaint: left leg pain  HPI: [name] [name] is a 68 y.o. female who presents with ongoing pain in the left leg x 4 months  MRI reveals facet cyst on left at L4/5, xrays reveal instability at L4/5  Patient has failed multiple forms of conservative care and continues to have pain (see office notes for additional details regarding the patient's full course of treatment)  Past Medical History  Diagnosis Date  . Arthritis   . Anxiety   . Hyperlipidemia     takes Atorvastatin 3 times a week  . Shingles   . Anemia   . Allergy     Nasonex daily as needed  . Wears glasses   . PONV (postoperative nausea and vomiting)   . 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 S/P radiation therapy 07/18/2013-08/28/2013    1) Left Breast / 50 Gy in 25 fractions/ 2) Left Breast Boost / 10 Gy in 5 fractions  . Hypertension     takes Cardura nightly and Verapamil daily  . History of shingles   . Sinus drainage     put on Levaquin yesterday if no better in 3 days will start prednisone  . Headache(784.0)     rare  . Weakness     tingling and numbness both hands and left leg  . Joint pain   . Chronic back pain     cyst sitting on L4-5;slipped disc  . GERD (gastroesophageal reflux disease)     takes Omeprazole daily  . History of gastric ulcer at age 47  . Diverticulosis   . Depression     takes Cymbalta daily  . Insomnia     takes Restoril nightly   Past Surgical History  Procedure Laterality Date  . Cataract extraction bilateral w/ anterior vitrectomy Bilateral   . Abdominal hysterectomy    . Cholecystectomy    . Knee arthroscopy Bilateral   . Tonsillectomy    . Colonoscopy    . Upper gi endoscopy    . Breast surgery      lt lump-neg  . Dupuytren / palmar fasciotomy Bilateral 2007    x 3 to left and once to the right   . Tubal ligation    . Foot surgery Right   . Epidural injections      x 3   History    Social History  . Marital Status: Widowed    Spouse Name: N/A    Number of Children: N/A  . Years of Education: N/A   Social History Main Topics  . Smoking status: Never Smoker   . Smokeless tobacco: Never Used  . Alcohol Use: Yes     Comment: wine occasionally  . Drug Use: No  . Sexual Activity: None   Other Topics Concern  . None   Social History Narrative  . None   Family History  Problem Relation Age of Onset  . Colon polyps Neg Hx   . Esophageal cancer Neg Hx   . Rectal cancer Neg Hx   . Stomach cancer Neg Hx   . Heart attack Mother   . Stroke Mother   . COPD Father   . Hypertension Father    Allergies  Allergen Reactions  . Requip [Ropinirole Hcl] Shortness Of Breath and Nausea And Vomiting  . Codeine Other (See Comments)    fatigue  . Minocycline Hives  . Morphine Sulfate Other (  See Comments)    Palpitations  . Tetracyclines & Related Hives    Pt doesn't remember a reaction  . Zestril [Lisinopril] Cough  . Zetia [Ezetimibe] Other (See Comments)    Muscle and joint pain  . Amoxicillin Rash    Pt doesn't remember a reaction  . Sulfa Antibiotics Other (See Comments)    Headache (pt states that a blood pressure medicine caused a severe headache but doesn't remember a reaction to sulfa)   Prior to Admission medications   Medication Sig Start Date End Date Taking? Authorizing Provider  anastrozole (ARIMIDEX) 1 MG tablet Take 1 tablet (1 mg total) by mouth daily. 10/14/13  Yes Minette Headland, NP  atorvastatin (LIPITOR) 80 MG tablet Take 40 mg by mouth 3 (three) times a week. Tuesday, Thursday and Sunday   Yes Historical Provider, MD  Cholecalciferol (VITAMIN D) 2000 UNITS tablet Take 4,000 Units by mouth daily.   Yes Historical Provider, MD  doxazosin (CARDURA) 8 MG tablet Take 4 mg by mouth at bedtime.   Yes Historical Provider, MD  DULoxetine (CYMBALTA) 30 MG capsule Take 90 mg by mouth daily.    Yes Historical Provider, MD  gabapentin (NEURONTIN) 800  MG tablet Take 800 mg by mouth 3 (three) times daily.  04/17/13  Yes Vicie Mutters, PA-C  levofloxacin (LEVAQUIN) 500 MG tablet Take 1 tablet (500 mg total) by mouth daily. 10/07/13  Yes Vicie Mutters, PA-C  mometasone (NASONEX) 50 MCG/ACT nasal spray Place 1 spray into both nostrils 2 (two) times daily as needed (sinusitis).   Yes Historical Provider, MD  omeprazole (PRILOSEC) 40 MG capsule Take 40 mg by mouth daily.   Yes Historical Provider, MD  oxyCODONE-acetaminophen (ENDOCET) 5-325 MG per tablet Take 1 tablet by mouth every 6 (six) hours as needed for severe pain.   Yes Historical Provider, MD  temazepam (RESTORIL) 30 MG capsule Take 30 mg by mouth at bedtime.   Yes Historical Provider, MD  verapamil (CALAN-SR) 240 MG CR tablet TAKE 1 TABLET BY MOUTH EVERY DAY WITH FOOD 10/10/13  Yes Unk Pinto, MD  predniSONE (DELTASONE) 20 MG tablet Take one pill two times daily for 3 days, take one pill daily for 4 days. 10/07/13   Vicie Mutters, PA-C  traMADol (ULTRAM) 50 MG tablet Take 50 mg by mouth 4 (four) times daily as needed for moderate pain (headaches).     Historical Provider, MD     All other systems have been reviewed and were otherwise negative with the exception of those mentioned in the HPI and as above.  Physical Exam: Filed Vitals:   10/16/13 0629  BP: 159/72  Pulse: 98  Temp: 98.3 F (36.8 C)  Resp: 18    General: Alert, no acute distress Cardiovascular: No pedal edema Respiratory: No cyanosis, no use of accessory musculature Skin: No lesions in the area of chief complaint Neurologic: Sensation intact distally Psychiatric: Patient is competent for consent with normal mood and affect Lymphatic: No axillary or cervical lymphadenopathy  MUSCULOSKELETAL: + SLR on left  Assessment/Plan: Left leg pain Plan for Procedure(s): POSTERIOR LUMBAR DECOMPRESSION AND FUSION 1 LEVEL   Sinclair Ship, MD 10/16/2013 7:47 AM

## 2013-10-16 NOTE — Anesthesia Procedure Notes (Signed)
Procedure Name: Intubation Date/Time: 10/16/2013 8:44 AM Performed by: Izora Gala Pre-anesthesia Checklist: Patient identified, Emergency Drugs available, Suction available, Patient being monitored and Timeout performed Patient Re-evaluated:Patient Re-evaluated prior to inductionOxygen Delivery Method: Circle system utilized Preoxygenation: Pre-oxygenation with 100% oxygen Intubation Type: IV induction Ventilation: Mask ventilation without difficulty and Oral airway inserted - appropriate to patient size Laryngoscope Size: Miller and 3 Grade View: Grade II Tube type: Oral Tube size: 7.5 mm Number of attempts: 1 Airway Equipment and Method: Stylet and LTA kit utilized Placement Confirmation: ETT inserted through vocal cords under direct vision,  positive ETCO2 and breath sounds checked- equal and bilateral Secured at: 23 cm Dental Injury: Teeth and Oropharynx as per pre-operative assessment  Comments: Larynx anterior.  Visualized arytenoids with laryngeal pressure by Dr. Glennon Mac

## 2013-10-16 NOTE — Anesthesia Preprocedure Evaluation (Addendum)
Anesthesia Evaluation  Patient identified by MRN, date of birth, ID band Patient awake    Reviewed: Allergy & Precautions, H&P , NPO status , Patient's Chart, lab work & pertinent test results  History of Anesthesia Complications Negative for: history of anesthetic complications  Airway Mallampati: II TM Distance: >3 FB Neck ROM: Full    Dental  (+) Teeth Intact, Dental Advisory Given   Pulmonary neg pulmonary ROS,  breath sounds clear to auscultation  Pulmonary exam normal       Cardiovascular hypertension, Pt. on medications - anginaRhythm:Regular Rate:Normal  Patient says she was told she had "extra beats".  Underwent stress test that was negative.  ECG shows SR  LBBB   Neuro/Psych Anxiety Depression Chronic back pain to L leg: narcotics negative neurological ROS  negative psych ROS   GI/Hepatic Neg liver ROS, GERD-  Medicated and Controlled,  Endo/Other  negative endocrine ROS  Renal/GU negative Renal ROS     Musculoskeletal negative musculoskeletal ROS (+)   Abdominal Normal abdominal exam  (+) + obese,   Peds  Hematology negative hematology ROS (+)   Anesthesia Other Findings Breast cancer: surgery and XRT  Reproductive/Obstetrics                        Anesthesia Physical Anesthesia Plan  ASA: II  Anesthesia Plan: General   Post-op Pain Management:    Induction: Intravenous  Airway Management Planned: Oral ETT  Additional Equipment:   Intra-op Plan:   Post-operative Plan: Extubation in OR  Informed Consent:   Dental advisory given  Plan Discussed with: CRNA, Anesthesiologist and Surgeon  Anesthesia Plan Comments: (Plan routine monitors, GETA)       Anesthesia Quick Evaluation

## 2013-10-16 NOTE — Anesthesia Postprocedure Evaluation (Signed)
Anesthesia Post Note  Patient: [name] [name]  Procedure(s) Performed: Procedure(s) (LRB): POSTERIOR LUMBAR FUSION 1 LEVEL (N/A)  Anesthesia type: general  Patient location: PACU  Post pain: Pain level controlled  Post assessment: Patient's Cardiovascular Status Stable  Last Vitals:  Filed Vitals:   10/16/13 1407  BP: 149/66  Pulse: 86  Temp:   Resp: 11    Post vital signs: Reviewed and stable  Level of consciousness: sedated  Complications: No apparent anesthesia complications

## 2013-10-17 ENCOUNTER — Encounter (HOSPITAL_COMMUNITY): Payer: Self-pay | Admitting: General Practice

## 2013-10-17 ENCOUNTER — Telehealth: Payer: Self-pay | Admitting: *Deleted

## 2013-10-17 MED ORDER — OXYCODONE HCL ER 10 MG PO T12A
10.0000 mg | EXTENDED_RELEASE_TABLET | Freq: Two times a day (BID) | ORAL | Status: DC
  Administered 2013-10-17 (×2): 10 mg via ORAL
  Filled 2013-10-17 (×2): qty 1

## 2013-10-17 MED ORDER — WHITE PETROLATUM GEL
Status: AC
  Administered 2013-10-17: 20:00:00
  Filled 2013-10-17: qty 5

## 2013-10-17 MED ORDER — GABAPENTIN 400 MG PO CAPS
800.0000 mg | ORAL_CAPSULE | Freq: Three times a day (TID) | ORAL | Status: DC
  Administered 2013-10-17 – 2013-10-22 (×16): 800 mg via ORAL
  Filled 2013-10-17 (×18): qty 2

## 2013-10-17 MED ORDER — GABAPENTIN 100 MG PO CAPS
200.0000 mg | ORAL_CAPSULE | Freq: Three times a day (TID) | ORAL | Status: DC
  Filled 2013-10-17 (×3): qty 2

## 2013-10-17 MED FILL — Sodium Chloride IV Soln 0.9%: INTRAVENOUS | Qty: 2000 | Status: AC

## 2013-10-17 MED FILL — Heparin Sodium (Porcine) Inj 1000 Unit/ML: INTRAMUSCULAR | Qty: 30 | Status: AC

## 2013-10-17 NOTE — Telephone Encounter (Signed)
Received call from Butch Penny at Dr. Idell Pickles office stating patient has not ever had a bone density performed there.  Will notify Mendel Ryder.

## 2013-10-17 NOTE — Progress Notes (Signed)
Fentanyl PCA d/c.  22 ml/220 mcg wasted, unable to waste in Pyxis as it is sent up from pharmacy.  Humphrey Rolls present for waste in sink.

## 2013-10-17 NOTE — Evaluation (Signed)
Physical Therapy Evaluation Patient Details Name: ALONA DANFORD MRN: [id number] DOB: [birth year] Today's Date: 10/17/2013   History of Present Illness  s/p L4-L5 fusion with facet cyst removal.   Clinical Impression  Patient is s/p surgery listed aboveresulting in the deficits listed below (see PT Problem List). Patient will benefit from skilled PT to increase their independence and safety with mobility (while adhering to their precautions) to allow discharge to next appropriate venue. Anticipate good progress from pt, who is highly motivated. At this time recommend SNF due to lack of 24/7 (A) and she has 19 steps to get to bedroom and bathroom at home.      Follow Up Recommendations SNF;Supervision/Assistance - 24 hour    Equipment Recommendations  3in1 (PT)    Recommendations for Other Services OT consult     Precautions / Restrictions Precautions Precautions: Back Precaution Booklet Issued: Yes (comment) Precaution Comments: reviewed precautions with pt Required Braces or Orthoses: Spinal Brace Spinal Brace: Thoracolumbosacral orthotic;Applied in sitting position Restrictions Weight Bearing Restrictions: No      Mobility  Bed Mobility Overal bed mobility: Needs Assistance Bed Mobility: Rolling;Sidelying to Sit Rolling: Min assist Sidelying to sit: Min assist       General bed mobility comments: cues and (A) for log rolling technique; incr time required due to pain   Transfers Overall transfer level: Needs assistance Equipment used: Rolling walker (2 wheeled) Transfers: Sit to/from Stand Sit to Stand: Min guard         General transfer comment: min guard to steady and cues for hand placement and sequencing with RW   Ambulation/Gait Ambulation/Gait assistance: Min guard;Min assist Ambulation Distance (Feet): 14 Feet Assistive device: Rolling walker (2 wheeled) Gait Pattern/deviations: Step-through pattern;Narrow base of support;Shuffle Gait velocity:  decreased due to pain  Gait velocity interpretation: Below normal speed for age/gender General Gait Details: pt with min LOB posteriorly; min (A) to maintain balance; cues for sequencing and safety with RW  Stairs            Wheelchair Mobility    Modified Rankin (Stroke Patients Only)       Balance Overall balance assessment: Needs assistance Sitting-balance support: Feet supported;Single extremity supported Sitting balance-Leahy Scale: Fair Sitting balance - Comments: able to tolerate sitting ~ 10 min to donn brace; able to sit with single or no UE support; very guarded due to pain    Standing balance support: During functional activity;Bilateral upper extremity supported Standing balance-Leahy Scale: Poor Standing balance comment: relies heavily on UE support from RW                             Pertinent Vitals/Pain 7/10; PCA PRN    Home Living Family/patient expects to be discharged to:: Private residence Living Arrangements: Alone Available Help at Discharge: Other (Comment) (unsure) Type of Home: Other(Comment) (Condo ) Home Access: Stairs to enter Entrance Stairs-Rails: None Entrance Stairs-Number of Steps: 1 Home Layout: Two level;Bed/bath upstairs Home Equipment: Walker - 2 wheels;Walker - 4 wheels;Shower seat;Hand held shower head      Prior Function Level of Independence: Independent               Hand Dominance   Dominant Hand: Right    Extremity/Trunk Assessment   Upper Extremity Assessment: Defer to OT evaluation           Lower Extremity Assessment: Generalized weakness (secondary to surgery)      Cervical / Trunk  Assessment: Normal  Communication   Communication: No difficulties  Cognition Arousal/Alertness: Awake/alert Behavior During Therapy: WFL for tasks assessed/performed Overall Cognitive Status: Within Functional Limits for tasks assessed                      General Comments General comments  (skin integrity, edema, etc.): educated on proper donning of brace and wear schedule     Exercises        Assessment/Plan    PT Assessment Patient needs continued PT services  PT Diagnosis Generalized weakness;Acute pain;Abnormality of gait   PT Problem List Decreased strength;Decreased activity tolerance;Decreased balance;Decreased mobility;Decreased knowledge of use of DME;Decreased safety awareness;Decreased knowledge of precautions;Pain  PT Treatment Interventions DME instruction;Gait training;Functional mobility training;Stair training;Therapeutic activities;Therapeutic exercise;Balance training;Neuromuscular re-education;Patient/family education   PT Goals (Current goals can be found in the Care Plan section) Acute Rehab PT Goals Patient Stated Goal: to get more independent and go home PT Goal Formulation: With patient Time For Goal Achievement: 10/21/13 Potential to Achieve Goals: Good    Frequency Min 5X/week   Barriers to discharge Decreased caregiver support;Inaccessible home environment does not have 24/7 (A) and has 19 STE    Co-evaluation               End of Session Equipment Utilized During Treatment: Gait belt;Back brace Activity Tolerance: Patient limited by pain Patient left: in chair;with call bell/phone within reach Nurse Communication: Mobility status;Patient requests pain meds         Time: 0806-0839 PT Time Calculation (min): 33 min   Charges:   PT Evaluation $Initial PT Evaluation Tier I: 1 Procedure PT Treatments $Gait Training: 8-22 mins $Therapeutic Activity: 8-22 mins   PT G CodesGustavus Bryant, [place] 10/17/2013, 8:53 AM

## 2013-10-17 NOTE — Evaluation (Signed)
I have read and agree with this note.   Time in/out: 244-695 Total time:48 minutes (Ev and 3SC)  [place], OTR/L [phone]

## 2013-10-17 NOTE — Op Note (Signed)
NAMEMarland Kitchen  [name], [name]             ACCOUNT NO.:  [id number]  MEDICAL RECORD NO.:  [id number]  LOCATION:  5N29C                        FACILITY:  [facility]  PHYSICIAN:  Phylliss Bob, MD      DATE OF BIRTH:  Nov 07, 1945  DATE OF PROCEDURE: 10/16/2013                              OPERATIVE REPORT   PREOPERATIVE DIAGNOSES: 1. Grade 1 L4-5 spondylolisthesis. 2. L4-5 spinal stenosis. 3. 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  POSTOPERATIVE DIAGNOSES: 1. Grade 1 L4-5 spondylolisthesis. 2. L4-5 spinal stenosis. 3. 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  PROCEDURE: 1. L4-5 laminectomy with bilateral partial facetectomy and removal of     large left L4-5 facet cyst. 2. Posterior spinal fusion. 3. Bilateral posterior spinal fusion, L4-5. 4. Placement of posterior instrumentation L4-5 (7 x 40 mm screws). 5. Use of local autograft. 6. Use of morselized allograft. 7. Intraoperative use of fluoroscopy.  SURGEON:  Phylliss Bob, MD  ASSISTANT:  Pricilla Holm, PA-C  ANESTHESIA:  General endotracheal anesthesia.  COMPLICATIONS:  None.  DISPOSITION:  Stable.  ESTIMATED BLOOD LOSS:  Minimal.  INDICATIONS FOR PROCEDURE:  Briefly, [name] [name] is a pleasant 68 year old female who did present to me with a 75-month history of severe pain in her left leg.  An MRI did reveal a very large left-sided L4-5 facet cyst.  The patient did fail multiple forms of conservative care.  She did have instability noted at L4-5 and we did have a discussion regarding proceeding with the procedure reflected above.  OPERATIVE DETAILS:  On October 16, 2013, the patient was brought to surgery and general endotracheal anesthesia was administered.  The patient was placed prone on a well-padded flat Jackson bed with a spinal frame. Antibiotics were given and a time-out procedure was performed.  The back was prepped and draped in usual sterile fashion.   I then made a midline incision.  The fascia was incised in the midline.  The musculature was retracted.  The transverse processes of L4 and L5 were subperiosteally exposed, as per the L4-5 facet joints bilaterally.  Using anatomical landmarks, I did cannulate the L4 and L5 pedicles bilaterally at L4 and at L5.  A ball-tip probe was used to confirm that there was no cortical violation.  A 5 mm tap was used.  Bone wax was placed in the cannulated pedicle holes.  The posterolateral gutters were then packed using Ray- Tec.  I then performed a central decompression.  I did remove the L4 spinous process.  I then performed a bilateral partial facetectomy.  On the left, a very large left-sided L4-5 facet cyst was noted.  It was noted to be very adherent to the dura immediately anterior to it.  I was able to perform a partial facetectomy on the left side, which did allow the cyst to migrate dorsally, away from the traversing L5 nerve.  I did also debulk the cyst substantially.  The anterior and medial wall of the cyst was extremely adherent to the dura, and I did feel that additional attempts at removing the wall of the cyst itself, would bring with it a substantially increased  risk of a durotomy.  I therefore, did believe the anterior wall of the cyst, which was adherent to the dura.  Again, the bone, of note, the facet joint immediately posterior and lateral to the cyst was partially removed, in order to obtain a full decompression of the exiting L5 nerve.  At the termination of the decompression, she had a traversing L5 nerve.  At the termination of the decompression, I was able to palpate the L4 and L5 pedicles using a Woodson.  There was no remaining substantial compression in the spinal canal.  At this point, a high-speed bur was used to decorticate the transverse processes of L4 and L5 and the L4-5 facet joint bilaterally.  An allograft in the form of a CONFORM SHEET was selected.   Autograft obtained from the decompression was packed into the posterolateral gutters on the right and on the left side.  A 50% of the CONFORM SHEET was placed over the autograft on the left, and 50% was placed on the right.  Of note, I did liberally irrigate the wound with approximately 1 L of normal saline, immediately prior to decortication.  At this point, 6 x 40 mm screws were placed bilaterally at the L4 and L5 pedicles.  A 40 mm rod was placed and the heads of the screws and caps were placed and a final locking procedure was performed.  I was very pleased with the fluoroscopic AP and lateral images.  At this point, the wound was closed in layers using #1 Vicryl, followed by 0 Vicryl, followed by 3-0 Monocryl.  Benzoin and Steri-Strips were applied followed by sterile dressing.  Of note, I did use neurologic monitoring throughout the surgery and there was no abnormal EMG activity.  The pedicle screws were tested using triggered EMG, and there was no screw that tested below 15 milliamps.  Of note, Pricilla Holm was my assistant throughout surgery and did aid in retraction, suctioning, and closure throughout the surgery.     Phylliss Bob, MD     MD/MEDQ  D:  10/16/2013  T:  10/17/2013  Job:  [id number]  cc:   Unk Pinto, M.D.

## 2013-10-17 NOTE — Progress Notes (Signed)
CSW has met with pt to discuss SNF placement. Pt agreeable and requesting placement at [facility].   Full assessment to follow.  Hunt Oris, MSW, [place]

## 2013-10-17 NOTE — Plan of Care (Signed)
Problem: Consults Goal: Diagnosis - Spinal Surgery Outcome: Completed/Met Date Met:  10/17/13 Thoraco/Lumbar Spine Fusion L4-L5

## 2013-10-17 NOTE — Progress Notes (Signed)
    Patient doing well, + moderate LBP Left leg pain is has resolved, no leg pain    Physical Exam: Filed Vitals:   10/17/13 0428  BP: 138/67  Pulse: 121  Temp: 98.5 F (36.9 C)  Resp: 13    Dressing in place NVI  POD #1 s/p L4/5 decompression and fusion  - up with PT/OT, encourage ambulation - Percocet for pain, Valium for muscle spasms, will also add 10mg  oxycontin for now - likely d/c home Friday/Saturday

## 2013-10-17 NOTE — Evaluation (Signed)
Occupational Therapy Evaluation Patient Details Name: [name] [name] MRN: [id number] DOB: 10/08/1945 Today's Date: 10/17/2013    History of Present Illness s/p L4-L5 fusion with facet cyst removal.    Clinical Impression   Pta pt was independent with ADLs and now from above presents with generalized weakness, acute pain and back precautions limiting her independence with self care tasks. Educated pt on AE for LB bathing/dressing, compensatory strategies for ADLs/IADLs, proper body mechanics for transfer while adhering to back precautions and reviewed how to don/doff TLSO brace. Pt lives alone and would benefit from additional acute OT with f/u of SNF to get to a modified independent level prior to D/C home. Will continue to follow.    Follow Up Recommendations  Supervision/Assistance - 24 hour;SNF    Equipment Recommendations  3 in 1 bedside comode;Other (comment) (Long handled sponge, reacher)       Precautions / Restrictions Precautions Precautions: Back Precaution Booklet Issued: Yes (comment) Precaution Comments: reviewed precautions with pt Required Braces or Orthoses: Spinal Brace Spinal Brace: Thoracolumbosacral orthotic;Applied in sitting position      Mobility Bed Mobility Overal bed mobility: Needs Assistance Bed Mobility: Rolling;Sit to Sidelying Rolling: Min assist     Sit to sidelying: Min assist General bed mobility comments: VC for log rolling technique and A required for LEs; increased pain when repositioning in bed  Transfers Overall transfer level: Needs assistance Equipment used: Rolling walker (2 wheeled) Transfers: Sit to/from Stand Sit to Stand: Min guard              Balance Overall balance assessment: Needs assistance Sitting-balance support: Single extremity supported;Feet supported Sitting balance-Leahy Scale: Fair     Standing balance-Leahy Scale: Poor Standing balance comment: relies heavily on RW for support while standing; pt  able to use both hands intermittently for grooming activities but quickly returns one hand to the RW/countertop                            ADL Overall ADL's : Needs assistance/impaired Eating/Feeding: Independent;Sitting   Grooming: Min guard;Standing;Wash/dry hands;Wash/dry face;Oral care   Upper Body Bathing: Sitting;Set up   Lower Body Bathing: Minimal assistance;With adaptive equipment;Adhering to back precautions;Sit to/from stand   Upper Body Dressing : Set up;Sitting   Lower Body Dressing: Minimal assistance;Adhering to back precautions;With adaptive equipment;Sit to/from stand   Toilet Transfer: Min guard;RW;Ambulation;Grab bars   Toileting- Water quality scientist and Hygiene: Adhering to back precautions;Sit to/from stand;Maximal assistance (due to girth and back brace)       Functional mobility during ADLs: Min guard;Rolling walker General ADL Comments: Pt was in pain at the start of tx. Pt lives alone and doesn't feel that safe going home with the back precautions and brace without help. Educated had pt demonstrate knowledge of AE for LB bathing/dressing and the toilet aide. Educated pt on using 2 cups (one for spitting and one for rinsing) for oral care, good body mechanics for transfers and comensatory strategies for IADLs.  Reviewed precautions, how to don/doff brace and wearing schedule.               Pertinent Vitals/Pain Pt c/o pain as "off the chart" during tx. Pt was given pain medicine at beginning of tx and was repositioned back in bed at the end to rest.     Hand Dominance Right   Extremity/Trunk Assessment Upper Extremity Assessment Upper Extremity Assessment: Generalized weakness   Lower Extremity Assessment Lower Extremity Assessment:  Defer to PT evaluation   Cervical / Trunk Assessment Cervical / Trunk Assessment: Normal   Communication Communication Communication: No difficulties   Cognition Arousal/Alertness:  Awake/alert Behavior During Therapy: WFL for tasks assessed/performed Overall Cognitive Status: Within Functional Limits for tasks assessed                                Home Living Family/patient expects to be discharged to:: [place]: Gilford Rile - 2 wheels;Walker - 4 wheels;Shower seat;Hand held shower head          Prior Functioning/Environment Level of Independence: Independent             OT Diagnosis: Generalized weakness;Acute pain   OT Problem List: Decreased strength;Decreased range of motion;Decreased activity tolerance;Decreased knowledge of use of DME or AE;Obesity;Impaired balance (sitting and/or standing);Pain   OT Treatment/Interventions: Self-care/ADL training;Energy conservation;DME and/or AE instruction;Balance training;Patient/family education    OT Goals(Current goals can be found in the care plan section) Acute Rehab OT Goals Patient Stated Goal: to get more independent and go home OT Goal Formulation: With patient Time For Goal Achievement: 10/24/13 Potential to Achieve Goals: Good ADL Goals Pt Will Perform Lower Body Bathing: with set-up;with supervision;sit to/from stand;with adaptive equipment Pt Will Perform Lower Body Dressing: with set-up;with supervision;sit to/from stand;with adaptive equipment Pt Will Transfer to Toilet: with set-up;with supervision;bedside commode;ambulating (over toilet) Pt Will Perform Toileting - Clothing Manipulation and hygiene: with mod assist;sit to/from stand Additional ADL Goal #1: Pt will don/doff back brace while adhering to precations with S  OT Frequency: Min 2X/week   Barriers to D/C: Inaccessible home environment             End of Session Equipment Utilized During Treatment: Gait belt;Rolling walker;Back brace  Activity Tolerance: Patient limited by pain Patient left: in bed;with call bell/phone within reach;with family/visitor  present   Time:  -    Charges:    G-CodesLyda Perone 2013/11/16, 12:24 PM

## 2013-10-18 ENCOUNTER — Inpatient Hospital Stay (HOSPITAL_COMMUNITY): Payer: Medicare Other

## 2013-10-18 ENCOUNTER — Encounter (HOSPITAL_COMMUNITY): Payer: Self-pay | Admitting: Orthopedic Surgery

## 2013-10-18 DIAGNOSIS — I471 Supraventricular tachycardia: Secondary | ICD-10-CM

## 2013-10-18 DIAGNOSIS — J9600 Acute respiratory failure, unspecified whether with hypoxia or hypercapnia: Secondary | ICD-10-CM

## 2013-10-18 DIAGNOSIS — E669 Obesity, unspecified: Secondary | ICD-10-CM

## 2013-10-18 LAB — LACTIC ACID, PLASMA: Lactic Acid, Venous: 1 mmol/L (ref 0.5–2.2)

## 2013-10-18 LAB — URINALYSIS, ROUTINE W REFLEX MICROSCOPIC
Bilirubin Urine: NEGATIVE
Glucose, UA: NEGATIVE mg/dL
HGB URINE DIPSTICK: NEGATIVE
Ketones, ur: NEGATIVE mg/dL
Nitrite: NEGATIVE
PROTEIN: NEGATIVE mg/dL
Specific Gravity, Urine: 1.021 (ref 1.005–1.030)
UROBILINOGEN UA: 0.2 mg/dL (ref 0.0–1.0)
pH: 5.5 (ref 5.0–8.0)

## 2013-10-18 LAB — COMPREHENSIVE METABOLIC PANEL
ALT: 21 U/L (ref 0–35)
ANION GAP: 16 — AB (ref 5–15)
AST: 34 U/L (ref 0–37)
Albumin: 2.9 g/dL — ABNORMAL LOW (ref 3.5–5.2)
Alkaline Phosphatase: 84 U/L (ref 39–117)
BILIRUBIN TOTAL: 0.6 mg/dL (ref 0.3–1.2)
BUN: 9 mg/dL (ref 6–23)
CO2: 22 mEq/L (ref 19–32)
Calcium: 8.3 mg/dL — ABNORMAL LOW (ref 8.4–10.5)
Chloride: 99 mEq/L (ref 96–112)
Creatinine, Ser: 0.85 mg/dL (ref 0.50–1.10)
GFR calc Af Amer: 80 mL/min — ABNORMAL LOW (ref >90)
GFR calc non Af Amer: 69 mL/min — ABNORMAL LOW (ref >90)
GLUCOSE: 103 mg/dL — AB (ref 70–99)
Potassium: 4 mEq/L (ref 3.7–5.3)
Sodium: 137 mEq/L (ref 137–147)
Total Protein: 6.2 g/dL (ref 6.0–8.3)

## 2013-10-18 LAB — URINE MICROSCOPIC-ADD ON

## 2013-10-18 LAB — CBC
HCT: 24.5 % — ABNORMAL LOW (ref 36.0–46.0)
Hemoglobin: 7.8 g/dL — ABNORMAL LOW (ref 12.0–15.0)
MCH: 27.5 pg (ref 26.0–34.0)
MCHC: 31.8 g/dL (ref 30.0–36.0)
MCV: 86.3 fL (ref 78.0–100.0)
Platelets: 264 10*3/uL (ref 150–400)
RBC: 2.84 MIL/uL — ABNORMAL LOW (ref 3.87–5.11)
RDW: 16.5 % — AB (ref 11.5–15.5)
WBC: 17.2 10*3/uL — ABNORMAL HIGH (ref 4.0–10.5)

## 2013-10-18 LAB — PREPARE RBC (CROSSMATCH)

## 2013-10-18 MED ORDER — SODIUM CHLORIDE 0.9 % IV SOLN
1.0000 g | Freq: Once | INTRAVENOUS | Status: AC
  Administered 2013-10-18: 1 g via INTRAVENOUS
  Filled 2013-10-18 (×2): qty 1

## 2013-10-18 MED ORDER — VANCOMYCIN HCL 10 G IV SOLR
2000.0000 mg | Freq: Once | INTRAVENOUS | Status: AC
  Administered 2013-10-18: 2000 mg via INTRAVENOUS
  Filled 2013-10-18 (×2): qty 2000

## 2013-10-18 MED ORDER — FUROSEMIDE 10 MG/ML IJ SOLN
10.0000 mg | Freq: Once | INTRAMUSCULAR | Status: AC
  Administered 2013-10-18: 10 mg via INTRAVENOUS
  Filled 2013-10-18: qty 2

## 2013-10-18 MED ORDER — POLYETHYLENE GLYCOL 3350 17 G PO PACK
17.0000 g | PACK | Freq: Two times a day (BID) | ORAL | Status: DC
  Administered 2013-10-18 – 2013-10-20 (×5): 17 g via ORAL
  Filled 2013-10-18 (×12): qty 1

## 2013-10-18 MED ORDER — DOCUSATE SODIUM 100 MG PO CAPS
200.0000 mg | ORAL_CAPSULE | Freq: Two times a day (BID) | ORAL | Status: DC
  Administered 2013-10-18 – 2013-10-20 (×6): 200 mg via ORAL
  Filled 2013-10-18 (×10): qty 2

## 2013-10-18 MED ORDER — IOHEXOL 350 MG/ML SOLN
100.0000 mL | Freq: Once | INTRAVENOUS | Status: AC | PRN
  Administered 2013-10-18: 100 mL via INTRAVENOUS

## 2013-10-18 MED ORDER — BISACODYL 10 MG RE SUPP
10.0000 mg | Freq: Every day | RECTAL | Status: DC
  Administered 2013-10-18 – 2013-10-19 (×2): 10 mg via RECTAL
  Filled 2013-10-18 (×3): qty 1

## 2013-10-18 MED ORDER — SODIUM CHLORIDE 0.9 % IV SOLN
1.0000 g | Freq: Three times a day (TID) | INTRAVENOUS | Status: DC
  Administered 2013-10-19 – 2013-10-21 (×8): 1 g via INTRAVENOUS
  Filled 2013-10-18 (×11): qty 1

## 2013-10-18 MED ORDER — SODIUM CHLORIDE 0.9 % IV SOLN
INTRAVENOUS | Status: AC
Start: 2013-10-18 — End: 2013-10-19
  Administered 2013-10-18 – 2013-10-19 (×2): via INTRAVENOUS

## 2013-10-18 MED ORDER — METOPROLOL TARTRATE 25 MG PO TABS
25.0000 mg | ORAL_TABLET | Freq: Two times a day (BID) | ORAL | Status: DC
  Administered 2013-10-18 – 2013-10-22 (×8): 25 mg via ORAL
  Filled 2013-10-18 (×10): qty 1

## 2013-10-18 MED ORDER — SODIUM CHLORIDE 0.9 % IJ SOLN
INTRAMUSCULAR | Status: AC
  Administered 2013-10-18: 3 mL
  Filled 2013-10-18: qty 50

## 2013-10-18 MED ORDER — VANCOMYCIN HCL IN DEXTROSE 1-5 GM/200ML-% IV SOLN
1000.0000 mg | Freq: Two times a day (BID) | INTRAVENOUS | Status: DC
  Administered 2013-10-19 – 2013-10-21 (×5): 1000 mg via INTRAVENOUS
  Filled 2013-10-18 (×6): qty 200

## 2013-10-18 MED ORDER — ALBUTEROL SULFATE (2.5 MG/3ML) 0.083% IN NEBU
2.5000 mg | INHALATION_SOLUTION | RESPIRATORY_TRACT | Status: DC | PRN
  Administered 2013-10-20: 2.5 mg via RESPIRATORY_TRACT
  Filled 2013-10-18: qty 3

## 2013-10-18 MED ORDER — DIAZEPAM 2 MG PO TABS
2.0000 mg | ORAL_TABLET | Freq: Two times a day (BID) | ORAL | Status: DC | PRN
  Administered 2013-10-22: 2 mg via ORAL
  Filled 2013-10-18: qty 1

## 2013-10-18 NOTE — Progress Notes (Signed)
CSW provided bed offers, pt has accepted placement at [facility]. CSW to assist with dc once pt is medically stable.  Hunt Oris, MSW, [place]

## 2013-10-18 NOTE — Progress Notes (Signed)
Of note I did just review pts CBC, Hg came back 7.8 and 2U PRBC's have been ordered. I called and spoke to pts nurse and she reports pt doing better with reduction in px medication. Will call and speak to pt and inform regarding infusion

## 2013-10-18 NOTE — Progress Notes (Signed)
Pt with temp of 102.0, HR 144, O2 sats as low as 70% on room air. Sats up to 94% on 2L [place]. Pt also voided in bed. Pt remains A&Ox4, but lethargic and slow to respond. 650 mg tylenol administered. PA on call notified. Received verbal orders for chest x-ray, blood cultures x 2, UA, and to restart fluids. Dr. Eliseo Squires also notified by on call PA. Dr. Eliseo Squires up to see pt at bedside. New orders placed. Pt placed on tele (box 5N18). Nursing will continue to monitor.

## 2013-10-18 NOTE — Progress Notes (Addendum)
Clinical Social Work Department CLINICAL SOCIAL WORK PLACEMENT NOTE 10/18/2013  Patient:  ELLAR, HAKALA  Account Number:  [id number] Admit date:  10/16/2013  Clinical Social Worker:  Hunt Oris, Latanya Presser  Date/time:  10/18/2013 10:21 AM  Clinical Social Work is seeking post-discharge placement for this patient at the following level of care:   SKILLED NURSING   (*CSW will update this form in Epic as items are completed)   10/18/2013  Patient/family provided with [facility] Department of Clinical Social Work's list of facilities offering this level of care within the geographic area requested by the patient (or if unable, by the patient's family).  10/18/2013  Patient/family informed of their freedom to choose among providers that offer the needed level of care, that participate in Medicare, Medicaid or managed care program needed by the patient, have an available bed and are willing to accept the patient.  10/18/2013  Patient/family informed of [facility]' ownership interest in [facility], as well as of the fact that they are under no obligation to receive care at this facility.  PASARR submitted to EDS on 10/18/2013 PASARR number received on 10/18/2013  FL2 transmitted to all facilities in geographic area requested by pt/family on   FL2 transmitted to all facilities within larger geographic area on   Patient informed that his/her managed care company has contracts with or will negotiate with  certain facilities, including the following:     Patient/family informed of bed offers received:  10/18/2013 Patient chooses bed at [place] [facility] Physician recommends and patient chooses bed at    Patient to be transferred to  [facility] on  10/22/13 Patient to be transferred to facility by ptar Patient and family notified of transfer on Pt alert and oriented and will notify family. Name of family member notified:    The following physician request  were entered in Epic:   Additional Comments:  Hunt Oris, MSW, [place]

## 2013-10-18 NOTE — Progress Notes (Signed)
Physical Therapy Treatment Patient Details Name: [name] [name] MRN: [id number] DOB: [birth year] Today's Date: 10/18/2013    History of Present Illness s/p L4-L5 fusion with facet cyst removal.     PT Comments    Pt was very slow with ambulation due to lethargy and pain. amb limited by increased HR to 134bpm. Noted pt up in recliner with no brace on. Pt stated she got up to recliner without brace. RN notified about brace wear. Continue to recommend SNF for ongoing Physical Therapy.     Follow Up Recommendations        Equipment Recommendations       Recommendations for Other Services       Precautions / Restrictions Precautions Precautions: Back Precaution Comments: reviewed precautions with pt Required Braces or Orthoses: Spinal Brace Spinal Brace: Thoracolumbosacral orthotic;Applied in sitting position Restrictions Weight Bearing Restrictions: No    Mobility  Bed Mobility               General bed mobility comments: pt up in recliner before and after session  Transfers Overall transfer level: Needs assistance Equipment used: Rolling walker (2 wheeled) Transfers: Sit to/from Stand Sit to Stand: Min guard         General transfer comment: min guard for safety and cues for hand placement and to not bend anteriorly.  Ambulation/Gait Ambulation/Gait assistance: Min guard Ambulation Distance (Feet): 14 Feet Assistive device: Rolling walker (2 wheeled) Gait Pattern/deviations: Step-to pattern;Decreased stride length;Antalgic;Trunk flexed Gait velocity: decreased due to pain  Gait velocity interpretation: Below normal speed for age/gender General Gait Details: min A to maintain balance and for safety. cues for RW management.   Stairs            Wheelchair Mobility    Modified Rankin (Stroke Patients Only)       Balance                                    Cognition Arousal/Alertness: Lethargic;Suspect due to  medications Behavior During Therapy: [facility] for tasks assessed/performed Overall Cognitive Status: Within Functional Limits for tasks assessed                      Exercises      General Comments        Pertinent Vitals/Pain 710. Pt repositioned in recliner with brace on and pillows for posture.     Home Living                      Prior Function            PT Goals (current goals can now be found in the care plan section) Progress towards PT goals: Progressing toward goals    Frequency       PT Plan      Co-evaluation             End of Session Equipment Utilized During Treatment: Gait belt;Back brace Activity Tolerance: Patient tolerated treatment well;Patient limited by pain;Patient limited by lethargy Patient left: in chair;with call bell/phone within reach     Time: 1107-1127 PT Time Calculation (min): 20 min  Charges:  $Gait Training: 8-22 mins                    G Codes:      Jacqualyn Posey 10/18/2013, 11:34 AM  10/18/2013 [place], Tonia Brooms PTA  [phone] pager [phone] office

## 2013-10-18 NOTE — Progress Notes (Signed)
Consult Note Patient Demographics  [name] [name], is a 68 y.o. female, DOB - 1946-02-21, PJA:[id number]  Admit date - 10/16/2013   Admitting Physician Sinclair Ship, MD  Outpatient Primary MD for the patient is Alesia Richards, MD  LOS - 2   No chief complaint on file.          Subjective:   [name] today has, No headache, No chest pain, No abdominal pain - No Nausea, No new weakness tingling or numbness, ++ Cough , mild SOB.      Assessment & Plan    1. Elective lumbar fusion surgery. We'll do further management to primary team which is delivered orthopedics. Consider chemical prophylaxis if safe.    2. Fever postop. Clinically appears to be mild aspiration, she does unknown history of choking on food yesterday, does cough with her diet, will have speech evaluate, downgrade to clear liquid diet for now, feeding assistance aspiration precautions. Obtain blood cultures and sputum cultures. Place on vancomycin and meropenem for now. Chest x-ray does show bibasal infiltrates. Incentive spirometry added as well. UA pending. Have reminded lab more than 3 times to obtain CBC and BMP on urgent basis.   3. History of paroxysmal supraventricular tachycardia. Is on verapamil at home continue.    4. History of dyslipidemia. On statin continue.    5. GERD. On PPI.      6. Medical issues of morbid obesity, anxiety, shingles, breast cancer. Supportive care with outpatient followup with PCP and primary oncologist as needed.    7. Tachycardia. Most certainly due to fever of 102, monitor with supportive care, and lower extremity venous duplex postop.  Suspicion for PE is low.     Medications  Scheduled Meds: . anastrozole  1 mg Oral Daily  . atorvastatin  40 mg Oral Q T,Th,Sat-1800  . bisacodyl  10 mg Rectal Daily  . cholecalciferol  4,000 Units Oral Daily  . docusate sodium  200 mg Oral BID  . doxazosin  4 mg Oral QHS  . DULoxetine  90 mg Oral Daily  . fluticasone  1 spray Each Nare Daily  . gabapentin  800 mg Oral TID  . meropenem (MERREM) IV  1 g Intravenous Once  . pantoprazole  80 mg Oral QAC breakfast  . polyethylene glycol  17 g Oral BID  . temazepam  30 mg Oral QHS  . vancomycin  2,000 mg Intravenous Once  . verapamil  240 mg Oral Daily   Continuous Infusions: . sodium chloride 75 mL/hr at 10/18/13 0830  PRN Meds:.acetaminophen, acetaminophen, alum & mag hydroxide-simeth, diazepam, naloxone, ondansetron (ZOFRAN) IV, oxyCODONE-acetaminophen, senna-docusate, zolpidem  DVT Prophylaxis  Per primary team, consider Chemical prophylaxis   Lab Results  Component Value Date   PLT 401* 10/08/2013    Antibiotics     Anti-infectives   Start     Dose/Rate Route Frequency Ordered Stop   10/18/13 0930  vancomycin (VANCOCIN) 2,000 mg in sodium chloride 0.9 % 500 mL IVPB     2,000 mg 250 mL/hr over 120 Minutes Intravenous  Once 10/18/13 0847     10/18/13 0930  meropenem (MERREM) 1 g in sodium chloride 0.9 % 100 mL IVPB     1 g 200 mL/hr over 30 Minutes Intravenous  Once 10/18/13 0847     10/16/13 2000  vancomycin (VANCOCIN) 1,500 mg in sodium chloride 0.9 % 500 mL IVPB     1,500 mg 250 mL/hr over 120 Minutes Intravenous  Once 10/16/13 1653 10/16/13 2212   10/16/13 1645  levofloxacin (LEVAQUIN) tablet 500 mg  Status:  Discontinued     500 mg Oral Daily 10/16/13 1636 10/16/13 1713   10/16/13 0600  vancomycin (VANCOCIN) 1,500 mg in sodium chloride 0.9 % 500 mL IVPB     1,500 mg 250 mL/hr over 120 Minutes Intravenous On call to O.R. 10/15/13 1412 10/16/13 0833          Objective:   Filed Vitals:   10/17/13 1124  10/17/13 1400 10/17/13 2035 10/18/13 0517  BP: 129/58 128/55 149/60 145/54  Pulse:  110 110 144  Temp:  99.3 F (37.4 C) 100.7 F (38.2 C) 102 F (38.9 C)  TempSrc:      Resp:  15 16 20   Weight:      SpO2:  92% 94% 94%    Wt Readings from Last 3 Encounters:  10/16/13 101.152 kg (223 lb)  10/16/13 101.152 kg (223 lb)  10/14/13 101.56 kg (223 lb 14.4 oz)     Intake/Output Summary (Last 24 hours) at 10/18/13 1011 Last data filed at 10/18/13 0630  Gross per 24 hour  Intake  767.5 ml  Output      0 ml  Net  767.5 ml     Physical Exam  Awake Alert, Oriented X 3, No new F.N deficits, Normal affect [place].AT,PERRAL Supple Neck,No JVD, No cervical lymphadenopathy appriciated.  Symmetrical Chest wall movement, Good air movement bilaterally, bibasilar rales RRR,No Gallops,Rubs or new Murmurs, No Parasternal Heave +ve B.Sounds, Abd Soft, No tenderness, No organomegaly appriciated, No rebound - guarding or rigidity. No Cyanosis, Clubbing or edema, No new Rash or bruise     Data Review   Micro Results Recent Results (from the past 240 hour(s))  SURGICAL PCR SCREEN     Status: None   Collection Time    10/08/13 10:59 AM      Result Value Ref Range Status   MRSA, PCR NEGATIVE  NEGATIVE Final   Staphylococcus aureus NEGATIVE  NEGATIVE Final   Comment:            The Xpert SA Assay (FDA     approved for NASAL specimens     in patients over 32 years of age),     is one component of     a comprehensive surveillance     program.  Test performance has     been validated by [employer] for patients greater     than or equal to 31 year old.  It is not intended     to diagnose infection nor to     guide or monitor treatment.    Radiology Reports Dg Lumbar Spine 2-3 Views  10/16/2013   CLINICAL DATA:  Back pain.  EXAM: LUMBAR SPINE - 2-3 VIEW; DG C-ARM 61-120 MIN  COMPARISON:  MRI lumbar spine 08/06/2013.  FINDINGS: Cross-table film 1 demonstrates needles directed most  closely at the L3 and L5 spinous processes.  Cross-table film 2 demonstrates an angled probe directed most closely toward the L4-5 interspace.  Spot radiographs from intraoperative C-arm demonstrate pedicle screw and rod construct across the L4-5 interspace.  IMPRESSION: L4-5 fusion.   Electronically Signed   By: Rolla Flatten M.D.   On: 10/16/2013 12:45   Dg Chest Port 1 View  10/18/2013   CLINICAL DATA:  Shortness of breath, postoperative lumbar surgery  EXAM: PORTABLE CHEST - 1 VIEW  COMPARISON:  03/19/2013  FINDINGS: Cardiac shadow is stable. The left lung is well aerated and clear. Increasing infiltrate is noted in the right lung base. Continued followup is recommended. The bony structures are within normal limits.  IMPRESSION: New right basilar infiltrate.  Followup films are recommended.   Electronically Signed   By: Inez Catalina M.D.   On: 10/18/2013 07:35   Dg C-arm 1-60 Min  10/16/2013   CLINICAL DATA:  Back pain.  EXAM: LUMBAR SPINE - 2-3 VIEW; DG C-ARM 61-120 MIN  COMPARISON:  MRI lumbar spine 08/06/2013.  FINDINGS: Cross-table film 1 demonstrates needles directed most closely at the L3 and L5 spinous processes.  Cross-table film 2 demonstrates an angled probe directed most closely toward the L4-5 interspace.  Spot radiographs from intraoperative C-arm demonstrate pedicle screw and rod construct across the L4-5 interspace.  IMPRESSION: L4-5 fusion.   Electronically Signed   By: Rolla Flatten M.D.   On: 10/16/2013 12:45    CBC No results found for this basename: WBC, HGB, HCT, PLT, MCV, MCH, MCHC, RDW, NEUTRABS, LYMPHSABS, MONOABS, EOSABS, BASOSABS, BANDABS, BANDSABD,  in the last 168 hours  Chemistries  No results found for this basename: NA, K, CL, CO2, GLUCOSE, BUN, CREATININE, GFRCGP, CALCIUM, MG, AST, ALT, ALKPHOS, BILITOT,  in the last 168 hours ------------------------------------------------------------------------------------------------------------------ CrCl is unknown because both  a height and weight (above a minimum accepted value) are required for this calculation. ------------------------------------------------------------------------------------------------------------------ No results found for this basename: HGBA1C,  in the last 72 hours ------------------------------------------------------------------------------------------------------------------ No results found for this basename: CHOL, HDL, LDLCALC, TRIG, CHOLHDL, LDLDIRECT,  in the last 72 hours ------------------------------------------------------------------------------------------------------------------ No results found for this basename: TSH, T4TOTAL, FREET3, T3FREE, THYROIDAB,  in the last 72 hours ------------------------------------------------------------------------------------------------------------------ No results found for this basename: VITAMINB12, FOLATE, FERRITIN, TIBC, IRON, RETICCTPCT,  in the last 72 hours  Coagulation profile No results found for this basename: INR, PROTIME,  in the last 168 hours  No results found for this basename: DDIMER,  in the last 72 hours  Cardiac Enzymes No results found for this basename: CK, CKMB, TROPONINI, MYOGLOBIN,  in the last 168 hours ------------------------------------------------------------------------------------------------------------------ No components found with this basename: POCBNP,      Time Spent in minutes  35   Laurey Salser K M.D on 10/18/2013 at 10:11 AM  Between 7am to 7pm - Pager - [phone]  After 7pm go to [email or web address] - password [email or web address]  And look for the night coverage person covering for me after hours  [facility] Office  [phone]   **Disclaimer: This note may have been dictated with voice recognition  software. Similar sounding words can inadvertently be transcribed and this note may contain transcription errors which may not have been corrected upon publication of note.**

## 2013-10-18 NOTE — Progress Notes (Signed)
Clinical Social Work Department BRIEF PSYCHOSOCIAL ASSESSMENT 10/18/2013  Patient:  [name] [name], [name] [name]     Account Number:  [id number]     Admit date:  10/16/2013  Clinical Social Worker:  Valda Lamb  Date/Time:  10/17/2013 03:30 PM  Referred by:  Physician  Date Referred:  10/18/2013 Referred for  SNF Placement   Other Referral:   Interview type:  Patient Other interview type:   CSW also completed assessment with pt son in the room, and grand daughter.    PSYCHOSOCIAL DATA Living Status:  ALONE Admitted from facility:   Level of care:   Primary support name:  [name] [name] Primary support relationship to patient:   Degree of support available:   Pt appears to have a good support system from children however she lives alone and does not have anyone to help her at home.    CURRENT CONCERNS Current Concerns  Post-Acute Placement   Other Concerns:    SOCIAL WORK ASSESSMENT / PLAN CSW visited pt room and introduced herself and role to pt and family, CSW received consent to speak while family was present.    Pt appreciative of visit and confirmed that she needs ST SNF before returning home as her family does not live locally. Pt states she lives near [facility] and would prefer placement there. CSW answered pt and family questions and informed her that [facility] would follow up with bed offers.   Assessment/plan status:  Psychosocial Support/Ongoing Assessment of Needs Other assessment/ plan:   Information/referral to community resources:   CSW provided pt with a SNF list and CSW contact number    PATIENT'S/FAMILY'S RESPONSE TO PLAN OF CARE: Pt was alert and oriented and very pleasant to speak to. Pt son and grandchildren also present. Pt agreeable to placement and CSW intervention.       Hunt Oris, MSW, [place]

## 2013-10-18 NOTE — Progress Notes (Signed)
ANTIBIOTIC CONSULT NOTE - INITIAL  Pharmacy Consult for vancomycin + meropenem Indication: rule out pneumonia  Allergies  Allergen Reactions  . Requip [Ropinirole Hcl] Shortness Of Breath and Nausea And Vomiting  . Codeine Other (See Comments)    fatigue  . Minocycline Hives  . Morphine Sulfate Other (See Comments)    Palpitations  . Tetracyclines & Related Hives    Pt doesn't remember a reaction  . Zestril [Lisinopril] Cough  . Zetia [Ezetimibe] Other (See Comments)    Muscle and joint pain  . Amoxicillin Rash    Pt doesn't remember a reaction  . Sulfa Antibiotics Other (See Comments)    Headache (pt states that a blood pressure medicine caused a severe headache but doesn't remember a reaction to sulfa)    Patient Measurements: Height: 5' 6.14" (168 cm) Weight: 223 lb (101.152 kg) IBW/kg (Calculated) : 59.63 Adjusted Body Weight:   Vital Signs: Temp: 102 F (38.9 C) (07/10 0517) BP: 145/54 mmHg (07/10 0517) Pulse Rate: 144 (07/10 0517) Intake/Output from previous day: 07/09 0701 - 07/10 0700 In: 1007.5 [P.O.:960; I.V.:47.5] Out: -  Intake/Output from this shift:    Labs:  Recent Labs  10/18/13 0845  WBC 17.2*  HGB 7.8*  PLT 264  CREATININE 0.85   Estimated Creatinine Clearance: 77.3 ml/min (by C-G formula based on Cr of 0.85). No results found for this basename: VANCOTROUGH, Corlis Leak, VANCORANDOM, GENTTROUGH, GENTPEAK, GENTRANDOM, TOBRATROUGH, TOBRAPEAK, TOBRARND, AMIKACINPEAK, AMIKACINTROU, AMIKACIN,  in the last 72 hours   Microbiology: Recent Results (from the past 720 hour(s))  SURGICAL PCR SCREEN     Status: None   Collection Time    10/08/13 10:59 AM      Result Value Ref Range Status   MRSA, PCR NEGATIVE  NEGATIVE Final   Staphylococcus aureus NEGATIVE  NEGATIVE Final   Comment:            The Xpert SA Assay (FDA     approved for NASAL specimens     in patients over 73 years of age),     is one component of     a comprehensive surveillance      program.  Test performance has     been validated by [employer] for patients greater     than or equal to 67 year old.     It is not intended     to diagnose infection nor to     guide or monitor treatment.    Medical History: Past Medical History  Diagnosis Date  . Arthritis   . Anxiety   . Hyperlipidemia     takes Atorvastatin 3 times a week  . Shingles   . Anemia   . Allergy     Nasonex daily as needed  . Wears glasses   . PONV (postoperative nausea and vomiting)   . 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 S/P radiation therapy 07/18/2013-08/28/2013    1) Left Breast / 50 Gy in 25 fractions/ 2) Left Breast Boost / 10 Gy in 5 fractions  . Hypertension     takes Cardura nightly and Verapamil daily  . History of shingles   . Sinus drainage     put on Levaquin yesterday if no better in 3 days will start prednisone  . Headache(784.0)     rare  . Weakness     tingling and numbness both hands and left leg  .  Joint pain   . Chronic back pain     cyst sitting on L4-5;slipped disc  . GERD (gastroesophageal reflux disease)     takes Omeprazole daily  . History of gastric ulcer at age 38  . Diverticulosis   . Depression     takes Cymbalta daily  . Insomnia     takes Restoril nightly    Medications:  Anti-infectives   Start     Dose/Rate Route Frequency Ordered Stop   10/19/13 0100  vancomycin (VANCOCIN) IVPB 1000 mg/200 mL premix     1,000 mg 200 mL/hr over 60 Minutes Intravenous Every 12 hours 10/18/13 1142     10/18/13 2000  meropenem (MERREM) 1 g in sodium chloride 0.9 % 100 mL IVPB     1 g 200 mL/hr over 30 Minutes Intravenous Every 8 hours 10/18/13 1142     10/18/13 0930  vancomycin (VANCOCIN) 2,000 mg in sodium chloride 0.9 % 500 mL IVPB     2,000 mg 250 mL/hr over 120 Minutes Intravenous  Once 10/18/13 0847     10/18/13 0930  meropenem (MERREM) 1 g in sodium chloride 0.9 % 100 mL IVPB     1 g 200 mL/hr over  30 Minutes Intravenous  Once 10/18/13 0847     10/16/13 2000  vancomycin (VANCOCIN) 1,500 mg in sodium chloride 0.9 % 500 mL IVPB     1,500 mg 250 mL/hr over 120 Minutes Intravenous  Once 10/16/13 1653 10/16/13 2212   10/16/13 1645  levofloxacin (LEVAQUIN) tablet 500 mg  Status:  Discontinued     500 mg Oral Daily 10/16/13 1636 10/16/13 1713   10/16/13 0600  vancomycin (VANCOCIN) 1,500 mg in sodium chloride 0.9 % 500 mL IVPB     1,500 mg 250 mL/hr over 120 Minutes Intravenous On call to O.R. 10/15/13 1412 10/16/13 5374     Assessment: 68 yof presented to the hospital initially with LLE pain. Now to start broad-spectrum empiric meropenem + vancomycin for fever/PNA. Tmax is 102, WBC is 17.2, Scr is WNL. Last received vancomycin on 7/8 pre- and post-op.   Vanc 7/10>> Mero 7/10>>  Goal of Therapy:  Vancomycin trough level 15-20 mcg/ml  Plan:  1. Vancomycin 2gm IV x 1 then 1gm IV Q12H 2. Meropenem 1gm IV Q8H 3. F/u renal fxn, C&S, clinical status and trough at [facility], Rande Lawman 10/18/2013,11:43 AM

## 2013-10-18 NOTE — Progress Notes (Signed)
OT Cancellation Note  Patient Details Name: [name] [name] MRN: [id number] DOB: [birth year]   Cancelled Treatment:    Reason Eval/Treat Not Completed: Patient at CT Scan. Will continue to follow.  [place], [place] [phone] 10/18/2013, 4:15 PM

## 2013-10-18 NOTE — Consult Note (Addendum)
[facility] Medical Consultation  [name] [name] HKV:[id number] DOB: [birth year] DOA: 10/16/2013 PCP: Alesia Richards, MD   Requesting physician: PA Date of consultation: 10/18/2013 Reason for consultation: tachycardia Impression/Recommendations Active Problems:   Obesity (BMI 30-39.9)   Radiculopathy   Fever, unspecified   Paroxysmal supraventricular tachycardia    Fever- blood culture x 2, tylenol PRN, U/A, chest x ray, CTA to r/o PE, recent surgery -hold abx pending these studies -holding on CTA until CR back  Tachycardia- place on tele, could be related to fever vs PE  Obesity  Recent lumbar fusion   I will followup again tomorrow. Please contact me if I can be of assistance in the meanwhile. Thank you for this consultation.  Chief Complaint: change in status  HPI:  68 yo female who underwent L4/5 decompression and fusion on 7/8 by Dr. Lynann Bologna.  Patient was up with PT and doing well until patient developed fevers and SOB last PM- has required 2 L of O2.  She has also been tachycardic Per nursing, patient has been doing her incentive spirometry Has history of breast cancer and is on arimidex Does not appear patient has been on blood thinners while in hospital No chest pain, no N/V/D Hospitalist were consulted for help with fevers.   Review of Systems:  All systems reviewed, negative unless stated above   Past Medical History  Diagnosis Date  . Arthritis   . Anxiety   . Hyperlipidemia     takes Atorvastatin 3 times a week  . Shingles   . Anemia   . Allergy     Nasonex daily as needed  . Wears glasses   . PONV (postoperative nausea and vomiting)   . 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 S/P radiation therapy 07/18/2013-08/28/2013    1) Left Breast / 50 Gy in 25 fractions/ 2) Left Breast Boost / 10 Gy in 5 fractions  . Hypertension     takes Cardura nightly and Verapamil daily  . History of shingles    . Sinus drainage     put on Levaquin yesterday if no better in 3 days will start prednisone  . Headache(784.0)     rare  . Weakness     tingling and numbness both hands and left leg  . Joint pain   . Chronic back pain     cyst sitting on L4-5;slipped disc  . GERD (gastroesophageal reflux disease)     takes Omeprazole daily  . History of gastric ulcer at age 75  . Diverticulosis   . Depression     takes Cymbalta daily  . Insomnia     takes Restoril nightly   Past Surgical History  Procedure Laterality Date  . Cataract extraction bilateral w/ anterior vitrectomy Bilateral   . Abdominal hysterectomy    . Cholecystectomy    . Knee arthroscopy Bilateral   . Tonsillectomy    . Colonoscopy    . Upper gi endoscopy    . Breast surgery      lt lump-neg  . Dupuytren / palmar fasciotomy Bilateral 2007    x 3 to left and once to the right   . Tubal ligation    . Foot surgery Right   . Epidural injections      x 3   Social History:  reports that she has never smoked. She has never used smokeless tobacco. She reports that she drinks alcohol. She reports that she  does not use illicit drugs.  Allergies  Allergen Reactions  . Requip [Ropinirole Hcl] Shortness Of Breath and Nausea And Vomiting  . Codeine Other (See Comments)    fatigue  . Minocycline Hives  . Morphine Sulfate Other (See Comments)    Palpitations  . Tetracyclines & Related Hives    Pt doesn't remember a reaction  . Zestril [Lisinopril] Cough  . Zetia [Ezetimibe] Other (See Comments)    Muscle and joint pain  . Amoxicillin Rash    Pt doesn't remember a reaction  . Sulfa Antibiotics Other (See Comments)    Headache (pt states that a blood pressure medicine caused a severe headache but doesn't remember a reaction to sulfa)   Family History  Problem Relation Age of Onset  . Colon polyps Neg Hx   . Esophageal cancer Neg Hx   . Rectal cancer Neg Hx   . Stomach cancer Neg Hx   . Heart attack Mother   . Stroke  Mother   . COPD Father   . Hypertension Father     Prior to Admission medications   Medication Sig Start Date End Date Taking? Authorizing Provider  anastrozole (ARIMIDEX) 1 MG tablet Take 1 tablet (1 mg total) by mouth daily. 10/14/13  Yes Minette Headland, NP  atorvastatin (LIPITOR) 80 MG tablet Take 40 mg by mouth 3 (three) times a week. Tuesday, Thursday and Sunday   Yes Historical Provider, MD  Cholecalciferol (VITAMIN D) 2000 UNITS tablet Take 4,000 Units by mouth daily.   Yes Historical Provider, MD  doxazosin (CARDURA) 8 MG tablet Take 4 mg by mouth at bedtime.   Yes Historical Provider, MD  DULoxetine (CYMBALTA) 30 MG capsule Take 90 mg by mouth daily.    Yes Historical Provider, MD  gabapentin (NEURONTIN) 800 MG tablet Take 800 mg by mouth 3 (three) times daily.  04/17/13  Yes Vicie Mutters, PA-C  levofloxacin (LEVAQUIN) 500 MG tablet Take 1 tablet (500 mg total) by mouth daily. 10/07/13  Yes Vicie Mutters, PA-C  mometasone (NASONEX) 50 MCG/ACT nasal spray Place 1 spray into both nostrils 2 (two) times daily as needed (sinusitis).   Yes Historical Provider, MD  omeprazole (PRILOSEC) 40 MG capsule Take 40 mg by mouth daily.   Yes Historical Provider, MD  oxyCODONE-acetaminophen (ENDOCET) 5-325 MG per tablet Take 1 tablet by mouth every 6 (six) hours as needed for severe pain.   Yes Historical Provider, MD  temazepam (RESTORIL) 30 MG capsule Take 30 mg by mouth at bedtime.   Yes Historical Provider, MD  verapamil (CALAN-SR) 240 MG CR tablet TAKE 1 TABLET BY MOUTH EVERY DAY WITH FOOD 10/10/13  Yes Unk Pinto, MD  predniSONE (DELTASONE) 20 MG tablet Take one pill two times daily for 3 days, take one pill daily for 4 days. 10/07/13   Vicie Mutters, PA-C  traMADol (ULTRAM) 50 MG tablet Take 50 mg by mouth 4 (four) times daily as needed for moderate pain (headaches).     Historical Provider, MD   Physical Exam: Blood pressure 145/54, pulse 144, temperature 102 F (38.9 C), temperature  source Oral, resp. rate 20, weight 101.152 kg (223 lb), SpO2 94.00%. Filed Vitals:   10/18/13 0517  BP: 145/54  Pulse: 144  Temp: 102 F (38.9 C)  Resp: 20     General:  right eye drooping (patient's normal), flushed appearing skin, slow to respond  ENT: moist mm  Neck: supple  Cardiovascular: tachy  Respiratory: coarse- cleared with coughing  Abdomen: +BS,  obese  Skin: flushed  Musculoskeletal: left calf tenderness  Psychiatric: slow to respond  Neurologic: right eye droop, moves all 4 ext  Labs on Admission:  Basic Metabolic Panel: No results found for this basename: NA, K, CL, CO2, GLUCOSE, BUN, CREATININE, CALCIUM, MG, PHOS,  in the last 168 hours Liver Function Tests: No results found for this basename: AST, ALT, ALKPHOS, BILITOT, PROT, ALBUMIN,  in the last 168 hours No results found for this basename: LIPASE, AMYLASE,  in the last 168 hours No results found for this basename: AMMONIA,  in the last 168 hours CBC: No results found for this basename: WBC, NEUTROABS, HGB, HCT, MCV, PLT,  in the last 168 hours Cardiac Enzymes: No results found for this basename: CKTOTAL, CKMB, CKMBINDEX, TROPONINI,  in the last 168 hours BNP: No components found with this basename: POCBNP,  CBG: No results found for this basename: GLUCAP,  in the last 168 hours  Radiological Exams on Admission: Dg Lumbar Spine 2-3 Views  10/16/2013   CLINICAL DATA:  Back pain.  EXAM: LUMBAR SPINE - 2-3 VIEW; DG C-ARM 61-120 MIN  COMPARISON:  MRI lumbar spine 08/06/2013.  FINDINGS: Cross-table film 1 demonstrates needles directed most closely at the L3 and L5 spinous processes.  Cross-table film 2 demonstrates an angled probe directed most closely toward the L4-5 interspace.  Spot radiographs from intraoperative C-arm demonstrate pedicle screw and rod construct across the L4-5 interspace.  IMPRESSION: L4-5 fusion.   Electronically Signed   By: Rolla Flatten M.D.   On: 10/16/2013 12:45   Dg C-arm 1-60  Min  10/16/2013   CLINICAL DATA:  Back pain.  EXAM: LUMBAR SPINE - 2-3 VIEW; DG C-ARM 61-120 MIN  COMPARISON:  MRI lumbar spine 08/06/2013.  FINDINGS: Cross-table film 1 demonstrates needles directed most closely at the L3 and L5 spinous processes.  Cross-table film 2 demonstrates an angled probe directed most closely toward the L4-5 interspace.  Spot radiographs from intraoperative C-arm demonstrate pedicle screw and rod construct across the L4-5 interspace.  IMPRESSION: L4-5 fusion.   Electronically Signed   By: Rolla Flatten M.D.   On: 10/16/2013 12:45    EKG: Independently reviewed. pending  Time spent: 65 min  Yesmin Mutch [facility] Pager [phone]  If 7PM-7AM, please contact night-coverage [email or web address] Password [email or web address] 10/18/2013, 6:09 AM

## 2013-10-18 NOTE — Progress Notes (Signed)
    Patient doing OK, well controlled expected mild-moderate LBP, resolved pre-op left leg pain. Reported fever and tachycardia overnight and IM team was consulted. Pt undergoing workup currently to include EKG, CBC, UA, Chest Xray, CTA. Pt reports feeling some better now but very sleepy. She was up and walking with PT yesterday and doing well. She wishes to go to SNF ([facility]) as she lives alone. She denies SOB, Chest Px, N/V/D. Has been up and to bathroom on own, has not had a BM yet. Admits to very little PO intake, just feels tired and "overmedicated."    Physical Exam: Filed Vitals:   10/18/13 0517  BP: 145/54  Pulse: 144  Temp: 102 F (38.9 C)  Resp: 20    Dressing in place, somewhat soaked, will reinforce. Pt appears very tired/sedated. Answers questions appropriately and A&OX3 but struggles to keep eyes open. Distal compartments soft and nontender, no labored breathing. NVI  POD #2 s/p L4/5 Decompression and Fusion  - Pt appears sedated  -D/C oxycontin  - D/C IV Dilaudid  - encourage only 1 PO percocet at a time PRN - CBC STAT ordered, awaiting results  - Dressing beginning to peal off, please reinforce, do not remove  - Cont PT/OT, encourage ambulation  -TLSO brace at all times when OOB except PM trips to bathroom   - Back precautions at all times - Tachycardia and fever 102 overnight, followed by IM team  -Pending ECG, Labwork, CT  - Likely able to d/c to SNF ([facility]) Sat vs Sunday pending work up by IM and improved alertness - F/U in office 2 weeks

## 2013-10-18 NOTE — Progress Notes (Signed)
I have read and agree with this note.   Golden Circle, OTR/L [phone]

## 2013-10-19 ENCOUNTER — Inpatient Hospital Stay (HOSPITAL_COMMUNITY): Payer: Medicare Other

## 2013-10-19 DIAGNOSIS — R509 Fever, unspecified: Secondary | ICD-10-CM

## 2013-10-19 DIAGNOSIS — M79609 Pain in unspecified limb: Secondary | ICD-10-CM

## 2013-10-19 LAB — TYPE AND SCREEN
ABO/RH(D): A POS
Antibody Screen: NEGATIVE
UNIT DIVISION: 0
Unit division: 0

## 2013-10-19 LAB — BASIC METABOLIC PANEL
ANION GAP: 12 (ref 5–15)
BUN: 9 mg/dL (ref 6–23)
CHLORIDE: 100 meq/L (ref 96–112)
CO2: 25 mEq/L (ref 19–32)
Calcium: 8.3 mg/dL — ABNORMAL LOW (ref 8.4–10.5)
Creatinine, Ser: 0.7 mg/dL (ref 0.50–1.10)
GFR, EST NON AFRICAN AMERICAN: 88 mL/min — AB (ref >90)
Glucose, Bld: 99 mg/dL (ref 70–99)
POTASSIUM: 3.8 meq/L (ref 3.7–5.3)
SODIUM: 137 meq/L (ref 137–147)

## 2013-10-19 LAB — CBC
HCT: 25.3 % — ABNORMAL LOW (ref 36.0–46.0)
Hemoglobin: 8.4 g/dL — ABNORMAL LOW (ref 12.0–15.0)
MCH: 28.3 pg (ref 26.0–34.0)
MCHC: 33.2 g/dL (ref 30.0–36.0)
MCV: 85.2 fL (ref 78.0–100.0)
PLATELETS: 221 10*3/uL (ref 150–400)
RBC: 2.97 MIL/uL — ABNORMAL LOW (ref 3.87–5.11)
RDW: 17.1 % — AB (ref 11.5–15.5)
WBC: 16.1 10*3/uL — AB (ref 4.0–10.5)

## 2013-10-19 LAB — IRON AND TIBC
IRON: 45 ug/dL (ref 42–135)
Saturation Ratios: 17 % — ABNORMAL LOW (ref 20–55)
TIBC: 262 ug/dL (ref 250–470)
UIBC: 217 ug/dL (ref 125–400)

## 2013-10-19 LAB — EXPECTORATED SPUTUM ASSESSMENT W GRAM STAIN, RFLX TO RESP C

## 2013-10-19 LAB — FOLATE

## 2013-10-19 LAB — RETICULOCYTES
RBC.: 3.06 MIL/uL — ABNORMAL LOW (ref 3.87–5.11)
RETIC CT PCT: 1 % (ref 0.4–3.1)
Retic Count, Absolute: 30.6 10*3/uL (ref 19.0–186.0)

## 2013-10-19 LAB — VITAMIN B12: Vitamin B-12: 729 pg/mL (ref 211–911)

## 2013-10-19 LAB — FERRITIN: Ferritin: 216 ng/mL (ref 10–291)

## 2013-10-19 LAB — EXPECTORATED SPUTUM ASSESSMENT W REFEX TO RESP CULTURE

## 2013-10-19 MED ORDER — FUROSEMIDE 10 MG/ML IJ SOLN
10.0000 mg | Freq: Once | INTRAMUSCULAR | Status: AC
  Administered 2013-10-19: 10 mg via INTRAVENOUS
  Filled 2013-10-19: qty 2

## 2013-10-19 NOTE — Evaluation (Signed)
Clinical/Bedside Swallow Evaluation Patient Details  Name: [name] [name] MRN: [id number] Date of Birth: 1945/06/24  Today's Date: 10/19/2013 Time: 1000-1014 SLP Time Calculation (min): 14 min  Past Medical History:  Past Medical History  Diagnosis Date  . Arthritis   . Anxiety   . Hyperlipidemia     takes Atorvastatin 3 times a week  . Shingles   . Anemia   . Allergy     Nasonex daily as needed  . Wears glasses   . PONV (postoperative nausea and vomiting)   . 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 S/P radiation therapy 07/18/2013-08/28/2013    1) Left Breast / 50 Gy in 25 fractions/ 2) Left Breast Boost / 10 Gy in 5 fractions  . Hypertension     takes Cardura nightly and Verapamil daily  . History of shingles   . Sinus drainage     put on Levaquin yesterday if no better in 3 days will start prednisone  . Headache(784.0)     rare  . Weakness     tingling and numbness both hands and left leg  . Joint pain   . Chronic back pain     cyst sitting on L4-5;slipped disc  . GERD (gastroesophageal reflux disease)     takes Omeprazole daily  . History of gastric ulcer at age 48  . Diverticulosis   . Depression     takes Cymbalta daily  . Insomnia     takes Restoril nightly   Past Surgical History:  Past Surgical History  Procedure Laterality Date  . Cataract extraction bilateral w/ anterior vitrectomy Bilateral   . Abdominal hysterectomy    . Cholecystectomy    . Knee arthroscopy Bilateral   . Tonsillectomy    . Colonoscopy    . Upper gi endoscopy    . Breast surgery      lt lump-neg  . Dupuytren / palmar fasciotomy Bilateral 2007    x 3 to left and once to the right   . Tubal ligation    . Foot surgery Right   . Epidural injections      x 3   HPI:  68 y.o. female admitted for L4-L5 fusion with facet cyst removal; fever and tachycardia post-surgery with MD concerned for aspiration; clinical swallow eval ordered.     Assessment / Plan / Recommendation Clinical Impression  Pt presents with normal oropharyngeal swallow with active mastication, swift swallow response, and no clinical signs of aspiration.  Recommend resuming regular diet with thin liquids.             Diet Recommendation Regular;Thin liquid   Liquid Administration via: Straw;Cup Medication Administration: Whole meds with liquid Supervision: Patient able to self feed Postural Changes and/or Swallow Maneuvers: Seated upright 90 degrees    Other  Recommendations Oral Care Recommendations: Oral care BID   Follow Up Recommendations  None      Swallow Study Prior Functional Status       General Date of Onset: 10/19/13 HPI: 68 y.o. female admitted for L4-L5 fusion with facet cyst removal; fever and tachycardia post-surgery with MD concerned for aspiration; clinical swallow eval ordered.  Type of Study: Bedside swallow evaluation Previous Swallow Assessment: none per records Diet Prior to this Study:  (clear liquids) Temperature Spikes Noted: No Respiratory Status: Nasal cannula Behavior/Cognition: Alert;Cooperative;Pleasant mood Oral Cavity - Dentition: Adequate natural dentition Self-Feeding Abilities: Able to feed self Patient Positioning: Upright in  bed Baseline Vocal Quality: Clear Volitional Cough: Strong Volitional Swallow: Able to elicit    Oral/Motor/Sensory Function Overall Oral Motor/Sensory Function: Appears within functional limits for tasks assessed   Ice Chips Ice chips: Within functional limits Presentation: Self Fed   Thin Liquid Thin Liquid: Within functional limits Presentation: Self Fed;Straw    Nectar Thick Nectar Thick Liquid: Not tested   Honey Thick Honey Thick Liquid: Not tested   Puree Puree: Within functional limits Presentation: Spoon;Self Fed   Solid   Dijon Cosens L. [place], [place] CCC/SLP Pager [phone]     Solid: Within functional limits Presentation: Self Fed       Juan Quam  Laurice 10/19/2013,10:19 AM

## 2013-10-19 NOTE — Progress Notes (Addendum)
This note also relates to the following rows which could not be included: Rate - Cannot attach notes to extension rows   Rate of blood administration noted to be at 100 mL/hr at this time.

## 2013-10-19 NOTE — Progress Notes (Signed)
VASCULAR LAB PRELIMINARY  PRELIMINARY  PRELIMINARY  PRELIMINARY  Bilateral lower extremity venous duplex completed.    Preliminary report:  Bilateral:  No evidence of DVT, superficial thrombosis, or Baker's Cyst.   Rodd Heft, RVS 10/19/2013, 2:56 PM

## 2013-10-19 NOTE — Progress Notes (Signed)
Physical Therapy Treatment Patient Details Name: [name] [name] MRN: [id number] DOB: [birth year] Today's Date: 10/19/2013    History of Present Illness s/p L4-L5 fusion with facet cyst removal.     PT Comments    Patient initially out for doppler study, returned with preliminary results Negative for LE DVT.  Patient fatigued, but agreeable to PT.  Mod assist to don/doff back brace, Min assist for transfers and gait in room distances.  Patient progressing towards therapy goals.  Follow Up Recommendations  SNF;Supervision/Assistance - 24 hour     Equipment Recommendations  3in1 (PT)    Recommendations for Other Services       Precautions / Restrictions Precautions Precautions: Back Precaution Comments: reviewed precautions with pt Required Braces or Orthoses: Spinal Brace Spinal Brace: Thoracolumbosacral orthotic;Applied in sitting position Restrictions Weight Bearing Restrictions: No    Mobility  Bed Mobility Overal bed mobility: Needs Assistance Bed Mobility: Rolling;Sit to Sidelying Rolling: Min assist Sidelying to sit: Min assist     Sit to sidelying: Min assist    Transfers Overall transfer level: Needs assistance Equipment used: Rolling walker (2 wheeled) Transfers: Sit to/from Stand Sit to Stand: Min guard         General transfer comment: min guard for safety and cues for hand placement and to not bend anteriorly.  Ambulation/Gait Ambulation/Gait assistance: Min guard Ambulation Distance (Feet): 25 Feet Assistive device: Rolling walker (2 wheeled) Gait Pattern/deviations: Decreased stride length;Antalgic Gait velocity: decreased due to pain  Gait velocity interpretation: Below normal speed for age/gender     Stairs            Wheelchair Mobility    Modified Rankin (Stroke Patients Only)       Balance Overall balance assessment: Needs assistance   Sitting balance-Leahy Scale: Fair       Standing balance-Leahy Scale: Poor                      Cognition Arousal/Alertness: Awake/alert Behavior During Therapy: WFL for tasks assessed/performed Overall Cognitive Status: Within Functional Limits for tasks assessed                      Exercises General Exercises - Lower Extremity Ankle Circles/Pumps: AROM;Both Heel Slides: AAROM;Both;10 reps Hip ABduction/ADduction: AAROM;Both;10 reps    General Comments        Pertinent Vitals/Pain Pain "sore and ache" descriptors, feels more support with back brace in place.    Home Living                      Prior Function            PT Goals (current goals can now be found in the care plan section) Acute Rehab PT Goals Patient Stated Goal: to get more independent and go home Progress towards PT goals: Progressing toward goals    Frequency  Min 5X/week    PT Plan Current plan remains appropriate    Co-evaluation             End of Session Equipment Utilized During Treatment: Gait belt;Back brace Activity Tolerance: Patient tolerated treatment well;Patient limited by lethargy;Patient limited by pain Patient left: in bed;with call bell/phone within reach     Time: 1610-1645 PT Time Calculation (min): 35 min  Charges:  $Gait Training: 8-22 mins $Therapeutic Activity: 8-22 mins                    G  Codes:      Greer Ee 10/19/2013, 4:52 PM

## 2013-10-19 NOTE — Progress Notes (Signed)
Consult Note Patient Demographics  [name] [name], is a 68 y.o. female, DOB - 1945-05-25, JIR:[id number]  Admit date - 10/16/2013   Admitting Physician Sinclair Ship, MD  Outpatient Primary MD for the patient is Alesia Richards, MD  LOS - 3   No chief complaint on file.          Subjective:   [name] [name] today has, No headache, No chest pain, No abdominal pain - No Nausea, No new weakness tingling or numbness, improved Cough , resolved SOB.      Assessment & Plan    1. Elective lumbar fusion surgery. We'll do further management to primary team which is delivered orthopedics. Consider chemical prophylaxis if safe.    2. Fever postop secondary to aspiration HCAP - monitor cultures which were drawn, on empiric vancomycin and meropenem with good effect, minimize narcotics, speech evaluate, currently on clear liquid diet, and a new feeding assistance aspiration precautions, flutter valve for pulmonary toiletry. Oxygen nebulizer treatments as needed. Clinically much improved.   If remains afebrile and cultures are negative on 10/20/2013, she can be switched to oral Levaquin and Flagyl (multiple allergies) for 7 more days and discharged with close speech followup and aspiration precautions to rehabilitation.    3. History of paroxysmal supraventricular tachycardia. Is on verapamil at home continue.    4. History of dyslipidemia. On statin continue.    5. GERD. On PPI.      6. Tachycardia. Most certainly due to fever of 102, monitor with supportive care, tachycardia resolved, CT angiogram chest unremarkable, lower extremity venous duplex is pending  clinical suspicion of DVT is low.    7. Post op blood loss related anemia. Received 2 units of packed RBC by surgical team on 10/18/2013. Some element of dilution due to IV fluids as well, anemia panel pending, repeat H&H in the morning. Gentle diuresis today.     8.Medical issues of morbid obesity, anxiety, shingles, breast cancer. Supportive care with outpatient followup with PCP and primary oncologist as needed.      Medications  Scheduled Meds: . anastrozole  1 mg Oral Daily  . atorvastatin  40 mg Oral Q T,Th,Sat-1800  . bisacodyl  10 mg Rectal Daily  . cholecalciferol  4,000 Units Oral Daily  . docusate sodium  200 mg Oral BID  . doxazosin  4 mg Oral QHS  . DULoxetine  90 mg Oral Daily  . fluticasone  1 spray Each Nare Daily  . gabapentin  800 mg Oral TID  . meropenem (MERREM) IV  1 g Intravenous Q8H  . metoprolol tartrate  25 mg Oral BID  . pantoprazole  80 mg Oral QAC breakfast  . polyethylene glycol  17 g Oral BID  . temazepam  30 mg Oral QHS  . vancomycin  1,000 mg Intravenous Q12H  . verapamil  240 mg Oral Daily   Continuous Infusions:   PRN Meds:.acetaminophen, acetaminophen, albuterol, alum & mag hydroxide-simeth, diazepam, naloxone, ondansetron (ZOFRAN) IV, oxyCODONE-acetaminophen, senna-docusate, zolpidem  DVT Prophylaxis  Per primary team, consider Chemical prophylaxis   Lab Results  Component Value Date   PLT 221 10/19/2013    Antibiotics     Anti-infectives   Start     Dose/Rate Route Frequency Ordered Stop   10/19/13 0100  vancomycin (VANCOCIN) IVPB 1000 mg/200 mL premix     1,000 mg 200 mL/hr over 60 Minutes Intravenous Every 12 hours 10/18/13 1142     10/18/13 2000  meropenem (MERREM) 1 g in sodium chloride 0.9 % 100 mL IVPB     1 g 200 mL/hr over 30 Minutes Intravenous Every 8 hours 10/18/13 1142     10/18/13 0930  vancomycin (VANCOCIN) 2,000 mg in sodium chloride 0.9 % 500 mL IVPB     2,000 mg 250 mL/hr over 120 Minutes Intravenous  Once  10/18/13 0847 10/18/13 1524   10/18/13 0930  meropenem (MERREM) 1 g in sodium chloride 0.9 % 100 mL IVPB     1 g 200 mL/hr over 30 Minutes Intravenous  Once 10/18/13 0847 10/18/13 1311   10/16/13 2000  vancomycin (VANCOCIN) 1,500 mg in sodium chloride 0.9 % 500 mL IVPB     1,500 mg 250 mL/hr over 120 Minutes Intravenous  Once 10/16/13 1653 10/16/13 2212   10/16/13 1645  levofloxacin (LEVAQUIN) tablet 500 mg  Status:  Discontinued     500 mg Oral Daily 10/16/13 1636 10/16/13 1713   10/16/13 0600  vancomycin (VANCOCIN) 1,500 mg in sodium chloride 0.9 % 500 mL IVPB     1,500 mg 250 mL/hr over 120 Minutes Intravenous On call to O.R. 10/15/13 1412 10/16/13 0833          Objective:   Filed Vitals:   10/18/13 2108 10/19/13 0003 10/19/13 0330 10/19/13 0539  BP: 105/57 129/61  133/63  Pulse: 79 75  85  Temp: 97.7 F (36.5 C) 97.2 F (36.2 C)  98 F (36.7 C)  TempSrc: Oral Oral  Oral  Resp: 20 20  21   Height:      Weight:      SpO2: 93% 95% 98% 98%    Wt Readings from Last 3 Encounters:  10/16/13 101.152 kg (223 lb)  10/16/13 101.152 kg (223 lb)  10/14/13 101.56 kg (223 lb 14.4 oz)     Intake/Output Summary (Last 24 hours) at 10/19/13 0934 Last data filed at 10/19/13 0600  Gross per 24 hour  Intake 2136.25 ml  Output      0 ml  Net 2136.25 ml     Physical Exam  Awake Alert, Oriented X 3, No new F.N deficits, Normal affect .AT,PERRAL Supple Neck,No JVD, No cervical lymphadenopathy appriciated.  Symmetrical Chest wall movement, Good air movement bilaterally, bibasilar rales RRR,No Gallops,Rubs or new Murmurs, No Parasternal Heave +ve B.Sounds, Abd Soft, No tenderness, No organomegaly appriciated, No rebound - guarding or rigidity. No Cyanosis, Clubbing or edema, No new Rash or bruise     Data Review   Micro Results No results found for this or any previous visit (from the past 240  hour(s)).  Radiology Reports Dg Lumbar Spine 2-3 Views  10/16/2013   CLINICAL  DATA:  Back pain.  EXAM: LUMBAR SPINE - 2-3 VIEW; DG C-ARM 61-120 MIN  COMPARISON:  MRI lumbar spine 08/06/2013.  FINDINGS: Cross-table film 1 demonstrates needles directed most closely at the L3 and L5 spinous processes.  Cross-table film 2 demonstrates an angled probe directed most closely toward the L4-5 interspace.  Spot radiographs from intraoperative C-arm demonstrate pedicle screw and rod construct across the L4-5 interspace.  IMPRESSION: L4-5 fusion.   Electronically Signed   By: Rolla Flatten M.D.   On: 10/16/2013 12:45   Dg Chest Port 1 View  10/18/2013   CLINICAL DATA:  Shortness of breath, postoperative lumbar surgery  EXAM: PORTABLE CHEST - 1 VIEW  COMPARISON:  03/19/2013  FINDINGS: Cardiac shadow is stable. The left lung is well aerated and clear. Increasing infiltrate is noted in the right lung base. Continued followup is recommended. The bony structures are within normal limits.  IMPRESSION: New right basilar infiltrate.  Followup films are recommended.   Electronically Signed   By: Inez Catalina M.D.   On: 10/18/2013 07:35   Dg C-arm 1-60 Min  10/16/2013   CLINICAL DATA:  Back pain.  EXAM: LUMBAR SPINE - 2-3 VIEW; DG C-ARM 61-120 MIN  COMPARISON:  MRI lumbar spine 08/06/2013.  FINDINGS: Cross-table film 1 demonstrates needles directed most closely at the L3 and L5 spinous processes.  Cross-table film 2 demonstrates an angled probe directed most closely toward the L4-5 interspace.  Spot radiographs from intraoperative C-arm demonstrate pedicle screw and rod construct across the L4-5 interspace.  IMPRESSION: L4-5 fusion.   Electronically Signed   By: Rolla Flatten M.D.   On: 10/16/2013 12:45    CBC  Recent Labs Lab 10/18/13 0845 10/19/13 0538  WBC 17.2* 16.1*  HGB 7.8* 8.4*  HCT 24.5* 25.3*  PLT 264 221  MCV 86.3 85.2  MCH 27.5 28.3  MCHC 31.8 33.2  RDW 16.5* 17.1*    Chemistries   Recent Labs Lab 10/18/13 0845 10/19/13 0538  NA 137 137  K 4.0 3.8  CL 99 100  CO2 22 25    GLUCOSE 103* 99  BUN 9 9  CREATININE 0.85 0.70  CALCIUM 8.3* 8.3*  AST 34  --   ALT 21  --   ALKPHOS 84  --   BILITOT 0.6  --    ------------------------------------------------------------------------------------------------------------------ estimated creatinine clearance is 82.1 ml/min (by C-G formula based on Cr of 0.7). ------------------------------------------------------------------------------------------------------------------ No results found for this basename: HGBA1C,  in the last 72 hours ------------------------------------------------------------------------------------------------------------------ No results found for this basename: CHOL, HDL, LDLCALC, TRIG, CHOLHDL, LDLDIRECT,  in the last 72 hours ------------------------------------------------------------------------------------------------------------------ No results found for this basename: TSH, T4TOTAL, FREET3, T3FREE, THYROIDAB,  in the last 72 hours ------------------------------------------------------------------------------------------------------------------ No results found for this basename: VITAMINB12, FOLATE, FERRITIN, TIBC, IRON, RETICCTPCT,  in the last 72 hours  Coagulation profile No results found for this basename: INR, PROTIME,  in the last 168 hours  No results found for this basename: DDIMER,  in the last 72 hours  Cardiac Enzymes No results found for this basename: CK, CKMB, TROPONINI, MYOGLOBIN,  in the last 168 hours ------------------------------------------------------------------------------------------------------------------ No components found with this basename: POCBNP,      Time Spent in minutes  35   Lala Lund K M.D on 10/19/2013 at 9:34 AM  Between 7am to 7pm - Pager - [phone]  After 7pm go to [email or web address] - password [email or web address]  And look  for the night coverage person covering for me after hours  [facility] Office  [phone]   **Disclaimer:  This note may have been dictated with voice recognition software. Similar sounding words can inadvertently be transcribed and this note may contain transcription errors which may not have been corrected upon publication of note.**

## 2013-10-19 NOTE — Progress Notes (Signed)
   PATIENT ID: [name] [name]   3 Days Post-Op Procedure(s) (LRB): POSTERIOR LUMBAR FUSION 1 LEVEL (N/A)  Subjective: Reports feeling much better this am after transfusion. No complaints or concerns.   Objective:  Filed Vitals:   10/19/13 0539  BP: 133/63  Pulse: 85  Temp: 98 F (36.7 C)  Resp: 21     Dressing in place Awake, alert, orientated x 3 Distally NVI  Labs:   Recent Labs  10/18/13 0845 10/19/13 0538  HGB 7.8* 8.4*   Recent Labs  10/18/13 0845 10/19/13 0538  WBC 17.2* 16.1*  RBC 2.84* 2.97*  HCT 24.5* 25.3*  PLT 264 221   Recent Labs  10/18/13 0845 10/19/13 0538  NA 137 137  K 4.0 3.8  CL 99 100  CO2 22 25  BUN 9 9  CREATININE 0.85 0.70  GLUCOSE 103* 99  CALCIUM 8.3* 8.3*    Assessment and Plan: Much improved today after 2 units packed RBC yesterday, Hgb 8.4, asymptomatic  Cognition improved after removing IV narcotics, continue with PO pain rx PT/OT today Per internal medicine, on IV abx for another 24 hrs, anticipate d/c to SNF  tomorrow when medically stable and SNF bed available Scripts in chart  VTE proph: SCDs

## 2013-10-20 DIAGNOSIS — J189 Pneumonia, unspecified organism: Secondary | ICD-10-CM

## 2013-10-20 LAB — BASIC METABOLIC PANEL
ANION GAP: 14 (ref 5–15)
BUN: 8 mg/dL (ref 6–23)
CHLORIDE: 101 meq/L (ref 96–112)
CO2: 25 mEq/L (ref 19–32)
Calcium: 8.5 mg/dL (ref 8.4–10.5)
Creatinine, Ser: 0.68 mg/dL (ref 0.50–1.10)
GFR calc Af Amer: >90 mL/min (ref >90)
GFR calc non Af Amer: 89 mL/min — ABNORMAL LOW (ref >90)
Glucose, Bld: 126 mg/dL — ABNORMAL HIGH (ref 70–99)
Potassium: 3.8 mEq/L (ref 3.7–5.3)
Sodium: 140 mEq/L (ref 137–147)

## 2013-10-20 LAB — MAGNESIUM: Magnesium: 1.9 mg/dL (ref 1.5–2.5)

## 2013-10-20 LAB — CBC
HCT: 26 % — ABNORMAL LOW (ref 36.0–46.0)
Hemoglobin: 8.3 g/dL — ABNORMAL LOW (ref 12.0–15.0)
MCH: 27.3 pg (ref 26.0–34.0)
MCHC: 31.9 g/dL (ref 30.0–36.0)
MCV: 85.5 fL (ref 78.0–100.0)
PLATELETS: 268 10*3/uL (ref 150–400)
RBC: 3.04 MIL/uL — AB (ref 3.87–5.11)
RDW: 16.8 % — ABNORMAL HIGH (ref 11.5–15.5)
WBC: 12.3 10*3/uL — AB (ref 4.0–10.5)

## 2013-10-20 NOTE — Progress Notes (Signed)
   PATIENT ID: [name] [name]   4 Days Post-Op Procedure(s) (LRB): POSTERIOR LUMBAR FUSION 1 LEVEL (N/A)  Subjective: Nursing and patient reports dropping saturation levels last night into the 80s off O2, Put on 2L O2 and then 4L after no improvement. This am is feeling better on nasal cannula. Using incentive spirometry hourly and discussed importance with patient this morning. Sitting up and eating breakfast. Pain controlled with Percocet.   Objective:  Filed Vitals:   10/20/13 0615  BP: 118/53  Pulse: 85  Temp: 98.4 F (36.9 C)  Resp: 18     Dressing c/d/i, changed by nursing today Awake, alert, orientated Respiratory effort wnl NVI bilat LE  Labs:   Recent Labs  10/18/13 0845 10/19/13 0538  HGB 7.8* 8.4*   Recent Labs  10/18/13 0845 10/19/13 0538 10/19/13 1017  WBC 17.2* 16.1*  --   RBC 2.84* 2.97* 3.06*  HCT 24.5* 25.3*  --   PLT 264 221  --    Recent Labs  10/18/13 0845 10/19/13 0538  NA 137 137  K 4.0 3.8  CL 99 100  CO2 22 25  BUN 9 9  CREATININE 0.85 0.70  GLUCOSE 103* 99  CALCIUM 8.3* 8.3*    Assessment and Plan: desat episode overnight, now on 4L O2 per internal medicine team, sats now 98 Will likely hold off on d/c until this has stabilized, per IM team, we appreciate their care in this case Continue with percocet for pain control, minimizing if possible, pain well controlled Cont PT IV abx per IM team D/c to SNF when medically stable  VTE proph: SCDs

## 2013-10-20 NOTE — Progress Notes (Signed)
PATIENT DETAILS Name: STEPHANE JUNKINS Age: 68 y.o. Sex: female Date of Birth: August 02, 1945 Admit Date: 10/16/2013 Admitting Physician Sinclair Ship, MD IOX:[id number] DAVID, MD  Subjective: Better today, denies any Shortness of breath  Assessment/Plan: Active Problems:   Suspected Aspiration PNA -improved, continue with empiric vancomycin and meropenem-started 7/10 -afebrile, leukocytosis significantly decreased -blood culture on 7/10 negative -seen by SLP on 7/11-now on regular diet with thin liquid -since clinically improved, suspect can be switched to oral Levaquin and Flagyl in the next day or so.  SIR's -secondary to above -resolved with IVF, Antibiotics  Acute Hypoxic resp failure -secondary to PNA -CTA Chest neg for PE -supportive care, O2 as needed  Tachycardia -secondary to fever -resolved with treatment of PNA  Elective lumbar fusion surgery - Defer further management to primary team. Consider chemical prophylaxis for DVT if safe.  History of paroxysmal supraventricular tachycardia -stable - continue verapamil  History of dyslipidemia - c/w statin    GERD - On PPI.  Morbid Obesity -counseled regarding weight loss  Disposition: Remain inpatient  DVT Prophylaxis:  SCD's  Code Status: Full code  Family Communication None at bedside  Procedures:  None  CONSULTS:  None  Time spent 40 minutes-which includes 50% of the time with face-to-face with patient/ family and coordinating care related to the above assessment and plan.    MEDICATIONS: Scheduled Meds: . anastrozole  1 mg Oral Daily  . atorvastatin  40 mg Oral Q T,Th,Sat-1800  . bisacodyl  10 mg Rectal Daily  . cholecalciferol  4,000 Units Oral Daily  . docusate sodium  200 mg Oral BID  . doxazosin  4 mg Oral QHS  . DULoxetine  90 mg Oral Daily  . fluticasone  1 spray Each Nare Daily  . gabapentin  800 mg Oral TID  . meropenem (MERREM) IV  1 g Intravenous  Q8H  . metoprolol tartrate  25 mg Oral BID  . pantoprazole  80 mg Oral QAC breakfast  . polyethylene glycol  17 g Oral BID  . temazepam  30 mg Oral QHS  . vancomycin  1,000 mg Intravenous Q12H  . verapamil  240 mg Oral Daily   Continuous Infusions:  PRN Meds:.acetaminophen, acetaminophen, albuterol, alum & mag hydroxide-simeth, diazepam, naloxone, ondansetron (ZOFRAN) IV, oxyCODONE-acetaminophen, senna-docusate, zolpidem  Antibiotics: Anti-infectives   Start     Dose/Rate Route Frequency Ordered Stop   10/19/13 0100  vancomycin (VANCOCIN) IVPB 1000 mg/200 mL premix     1,000 mg 200 mL/hr over 60 Minutes Intravenous Every 12 hours 10/18/13 1142     10/18/13 2000  meropenem (MERREM) 1 g in sodium chloride 0.9 % 100 mL IVPB     1 g 200 mL/hr over 30 Minutes Intravenous Every 8 hours 10/18/13 1142     10/18/13 0930  vancomycin (VANCOCIN) 2,000 mg in sodium chloride 0.9 % 500 mL IVPB     2,000 mg 250 mL/hr over 120 Minutes Intravenous  Once 10/18/13 0847 10/18/13 1524   10/18/13 0930  meropenem (MERREM) 1 g in sodium chloride 0.9 % 100 mL IVPB     1 g 200 mL/hr over 30 Minutes Intravenous  Once 10/18/13 0847 10/18/13 1311   10/16/13 2000  vancomycin (VANCOCIN) 1,500 mg in sodium chloride 0.9 % 500 mL IVPB     1,500 mg 250 mL/hr over 120 Minutes Intravenous  Once 10/16/13 1653 10/16/13 2212   10/16/13 1645  levofloxacin (LEVAQUIN) tablet 500 mg  Status:  Discontinued     500 mg Oral Daily 10/16/13 1636 10/16/13 1713   10/16/13 0600  vancomycin (VANCOCIN) 1,500 mg in sodium chloride 0.9 % 500 mL IVPB     1,500 mg 250 mL/hr over 120 Minutes Intravenous On call to O.R. 10/15/13 1412 10/16/13 0833       PHYSICAL EXAM: Vital signs in last 24 hours: Filed Vitals:   10/19/13 1405 10/19/13 2206 10/20/13 0028 10/20/13 0615  BP: 128/69 134/56  118/53  Pulse: 94 105  85  Temp: 98.8 F (37.1 C) 99.5 F (37.5 C) 99.7 F (37.6 C) 98.4 F (36.9 C)  TempSrc:  Oral  Oral  Resp: 18 18  18     Height:      Weight:      SpO2: 100%  87% 88%    Weight change:  Filed Weights   10/16/13 0629  Weight: 101.152 kg (223 lb)   Body mass index is 35.84 kg/(m^2).   Gen Exam: Awake and alert with clear speech.  Neck: Supple, No JVD.   Chest: Right base rales, but otherwise clear CVS: S1 S2 Regular, no murmurs.  Abdomen: soft, BS +, non tender, non distended.  Extremities: no edema, lower extremities warm to touch. Neurologic: Non Focal.   Skin: No Rash.   Wounds: N/A.    Intake/Output from previous day:  Intake/Output Summary (Last 24 hours) at 10/20/13 1106 Last data filed at 10/20/13 0900  Gross per 24 hour  Intake   1960 ml  Output      0 ml  Net   1960 ml     LAB RESULTS: CBC  Recent Labs Lab 10/18/13 0845 10/19/13 0538 10/20/13 0915  WBC 17.2* 16.1* 12.3*  HGB 7.8* 8.4* 8.3*  HCT 24.5* 25.3* 26.0*  PLT 264 221 268  MCV 86.3 85.2 85.5  MCH 27.5 28.3 27.3  MCHC 31.8 33.2 31.9  RDW 16.5* 17.1* 16.8*    Chemistries   Recent Labs Lab 10/18/13 0845 10/19/13 0538 10/20/13 0915  NA 137 137 140  K 4.0 3.8 3.8  CL 99 100 101  CO2 22 25 25   GLUCOSE 103* 99 126*  BUN 9 9 8   CREATININE 0.85 0.70 0.68  CALCIUM 8.3* 8.3* 8.5  MG  --   --  1.9    CBG: No results found for this basename: GLUCAP,  in the last 168 hours  GFR Estimated Creatinine Clearance: 82.1 ml/min (by C-G formula based on Cr of 0.68).  Coagulation profile No results found for this basename: INR, PROTIME,  in the last 168 hours  Cardiac Enzymes No results found for this basename: CK, CKMB, TROPONINI, MYOGLOBIN,  in the last 168 hours  No components found with this basename: POCBNP,  No results found for this basename: DDIMER,  in the last 72 hours No results found for this basename: HGBA1C,  in the last 72 hours No results found for this basename: CHOL, HDL, LDLCALC, TRIG, CHOLHDL, LDLDIRECT,  in the last 72 hours No results found for this basename: TSH, T4TOTAL, FREET3,  T3FREE, THYROIDAB,  in the last 72 hours  Recent Labs  10/19/13 1017  VITAMINB12 729  FOLATE >20.0  FERRITIN 216  TIBC 262  IRON 45  RETICCTPCT 1.0   No results found for this basename: LIPASE, AMYLASE,  in the last 72 hours  Urine Studies No results found for this basename: UACOL, UAPR, USPG, UPH, UTP, UGL, UKET, UBIL, UHGB, UNIT, UROB, ULEU, UEPI, UWBC, URBC, UBAC, CAST, CRYS, UCOM,  BILUA,  in the last 72 hours  MICROBIOLOGY: Recent Results (from the past 240 hour(s))  CULTURE, BLOOD (ROUTINE X 2)     Status: None   Collection Time    10/18/13  8:40 AM      Result Value Ref Range Status   Specimen Description BLOOD RIGHT ARM   Final   Special Requests BOTTLES DRAWN AEROBIC ONLY 5CC   Final   Culture  Setup Time     Final   Value: 10/18/2013 12:14     Performed at [facility]   Culture     Final   Value:        BLOOD CULTURE RECEIVED NO GROWTH TO DATE CULTURE WILL BE HELD FOR 5 DAYS BEFORE ISSUING A FINAL NEGATIVE REPORT     Performed at [facility]   Report Status PENDING   Incomplete  CULTURE, BLOOD (ROUTINE X 2)     Status: None   Collection Time    10/18/13  8:45 AM      Result Value Ref Range Status   Specimen Description BLOOD RIGHT HAND   Final   Special Requests BOTTLES DRAWN AEROBIC ONLY 10CC   Final   Culture  Setup Time     Final   Value: 10/18/2013 12:14     Performed at [facility]   Culture     Final   Value:        BLOOD CULTURE RECEIVED NO GROWTH TO DATE CULTURE WILL BE HELD FOR 5 DAYS BEFORE ISSUING A FINAL NEGATIVE REPORT     Performed at [facility]   Report Status PENDING   Incomplete  CULTURE, EXPECTORATED SPUTUM-ASSESSMENT     Status: None   Collection Time    10/19/13  5:21 PM      Result Value Ref Range Status   Specimen Description SPUTUM   Final   Special Requests NONE   Final   Sputum evaluation     Final   Value: THIS SPECIMEN IS ACCEPTABLE. RESPIRATORY CULTURE REPORT TO FOLLOW.   Report Status  10/19/2013 FINAL   Final    RADIOLOGY STUDIES/RESULTS: Dg Lumbar Spine 2-3 Views  10/16/2013   CLINICAL DATA:  Back pain.  EXAM: LUMBAR SPINE - 2-3 VIEW; DG C-ARM 61-120 MIN  COMPARISON:  MRI lumbar spine 08/06/2013.  FINDINGS: Cross-table film 1 demonstrates needles directed most closely at the L3 and L5 spinous processes.  Cross-table film 2 demonstrates an angled probe directed most closely toward the L4-5 interspace.  Spot radiographs from intraoperative C-arm demonstrate pedicle screw and rod construct across the L4-5 interspace.  IMPRESSION: L4-5 fusion.   Electronically Signed   By: Rolla Flatten M.D.   On: 10/16/2013 12:45   Ct Angio Chest Pe W/cm &/or Wo Cm  10/18/2013   CLINICAL DATA:  Shortness of breath.  EXAM: CT ANGIOGRAPHY CHEST WITH CONTRAST  TECHNIQUE: Multidetector CT imaging of the chest was performed using the standard protocol during bolus administration of intravenous contrast. Multiplanar CT image reconstructions and MIPs were obtained to evaluate the vascular anatomy.  CONTRAST:  148mL OMNIPAQUE IOHEXOL 350 MG/ML SOLN intravenously  COMPARISON:  Portable chest x-ray of October 18, 2013.  FINDINGS: Contrast within the pulmonary arterial tree is normal. There are no filling defects to suggest an acute pulmonary embolism. The caliber of the thoracic aorta is normal. There is no false lumen. The cardiac chambers are top-normal in size. There are faint coronary artery calcifications. The thoracic esophagus is  normal. There is a small hiatal hernia. There is no lymphadenopathy. There is no pleural nor pericardial effusion.  At lung window settings there are patchy interstitial densities bilaterally with relative sparing of the left lower lobe. There are no pulmonary parenchymal nodules. There is no bronchiectasis.  Within the upper abdomen the visualized portion of the liver exhibits well-circumscribed hypodensities with HU values of +4 to +6 consistent with cysts. The observed portions of the  adrenal glands and spleen are normal. The bony thorax is unremarkable.  Review of the MIP images confirms the above findings.  IMPRESSION: 1. Patchy interstitial and early alveolar densities in both lungs are consistent with pneumonia. 2. There is no acute pulmonary embolism nor acute thoracic aortic pathology. 3. There is no CHF nor pleural or pericardial effusion.   Electronically Signed   By: David  Martinique   On: 10/18/2013 15:14   Dg Chest Port 1 View  10/19/2013   CLINICAL DATA:  Short of breath  EXAM: PORTABLE CHEST - 1 VIEW  COMPARISON:  CT 10/18/2013  FINDINGS: Normal cardiac silhouette. There is bilateral patchy airspace disease not changed from prior. Low lung volumes. No pneumothorax.  IMPRESSION: Stable bilateral patchy airspace disease consistent with bilateral pneumonia.   Electronically Signed   By: Suzy Bouchard M.D.   On: 10/19/2013 08:03   Dg Chest Port 1 View  10/18/2013   CLINICAL DATA:  Shortness of breath, postoperative lumbar surgery  EXAM: PORTABLE CHEST - 1 VIEW  COMPARISON:  03/19/2013  FINDINGS: Cardiac shadow is stable. The left lung is well aerated and clear. Increasing infiltrate is noted in the right lung base. Continued followup is recommended. The bony structures are within normal limits.  IMPRESSION: New right basilar infiltrate.  Followup films are recommended.   Electronically Signed   By: Inez Catalina M.D.   On: 10/18/2013 07:35   Dg C-arm 1-60 Min  10/16/2013   CLINICAL DATA:  Back pain.  EXAM: LUMBAR SPINE - 2-3 VIEW; DG C-ARM 61-120 MIN  COMPARISON:  MRI lumbar spine 08/06/2013.  FINDINGS: Cross-table film 1 demonstrates needles directed most closely at the L3 and L5 spinous processes.  Cross-table film 2 demonstrates an angled probe directed most closely toward the L4-5 interspace.  Spot radiographs from intraoperative C-arm demonstrate pedicle screw and rod construct across the L4-5 interspace.  IMPRESSION: L4-5 fusion.   Electronically Signed   By: Rolla Flatten  M.D.   On: 10/16/2013 12:45    Oren Binet, MD  [facility] Pager:[phone]  If 7PM-7AM, please contact night-coverage [email or web address] Password [email or web address] 10/20/2013, 11:06 AM   LOS: 4 days   **Disclaimer: This note may have been dictated with voice recognition software. Similar sounding words can inadvertently be transcribed and this note may contain transcription errors which may not have been corrected upon publication of note.**

## 2013-10-21 LAB — CBC
HCT: 27 % — ABNORMAL LOW (ref 36.0–46.0)
Hemoglobin: 8.7 g/dL — ABNORMAL LOW (ref 12.0–15.0)
MCH: 27.8 pg (ref 26.0–34.0)
MCHC: 32.2 g/dL (ref 30.0–36.0)
MCV: 86.3 fL (ref 78.0–100.0)
PLATELETS: 281 10*3/uL (ref 150–400)
RBC: 3.13 MIL/uL — ABNORMAL LOW (ref 3.87–5.11)
RDW: 16.8 % — AB (ref 11.5–15.5)
WBC: 7.5 10*3/uL (ref 4.0–10.5)

## 2013-10-21 MED ORDER — LEVOFLOXACIN 750 MG PO TABS
750.0000 mg | ORAL_TABLET | Freq: Every day | ORAL | Status: DC
Start: 2013-10-21 — End: 2013-10-22
  Administered 2013-10-21 – 2013-10-22 (×2): 750 mg via ORAL
  Filled 2013-10-21 (×2): qty 1

## 2013-10-21 MED ORDER — METRONIDAZOLE 500 MG PO TABS
500.0000 mg | ORAL_TABLET | Freq: Three times a day (TID) | ORAL | Status: DC
  Administered 2013-10-21 – 2013-10-22 (×3): 500 mg via ORAL
  Filled 2013-10-21 (×6): qty 1

## 2013-10-21 NOTE — Discharge Summary (Signed)
Patient ID: TAIESHA BOVARD MRN: [id number] DOB/AGE: 68-Dec-1947 68 y.o.  Admit date: 10/16/2013 Discharge date: 10/21/2013  Admission Diagnoses:  Active Problems:   Obesity (BMI 30-39.9)   Radiculopathy   Fever, unspecified   Paroxysmal supraventricular tachycardia   Discharge Diagnoses:  Same  Past Medical History  Diagnosis Date  . Arthritis   . Anxiety   . Hyperlipidemia     takes Atorvastatin 3 times a week  . Shingles   . Anemia   . Allergy     Nasonex daily as needed  . Wears glasses   . PONV (postoperative nausea and vomiting)   . 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 S/P radiation therapy 07/18/2013-08/28/2013    1) Left Breast / 50 Gy in 25 fractions/ 2) Left Breast Boost / 10 Gy in 5 fractions  . Hypertension     takes Cardura nightly and Verapamil daily  . History of shingles   . Sinus drainage     put on Levaquin yesterday if no better in 3 days will start prednisone  . Headache(784.0)     rare  . Weakness     tingling and numbness both hands and left leg  . Joint pain   . Chronic back pain     cyst sitting on L4-5;slipped disc  . GERD (gastroesophageal reflux disease)     takes Omeprazole daily  . History of gastric ulcer at age 68  . Diverticulosis   . Depression     takes Cymbalta daily  . Insomnia     takes Restoril nightly    Surgeries: Procedure(s): POSTERIOR LUMBAR FUSION 1 LEVEL L4-5 on 10/16/2013   Consultants: Treatment Team:  Minerva Ends, MD  Discharged Condition: Improved  Hospital Course: PRESLEA RHODUS is an 68 y.o. female who was admitted 10/16/2013 for operative treatment of lumbar radiculopathy. Patient has severe unremitting pain that affects sleep, daily activities, and work/hobbies. After pre-op clearance the patient was taken to the operating room on 10/16/2013 and underwent  Procedure(s): POSTERIOR LUMBAR FUSION 1 L4-5 LEVEL.    Patient was given perioperative  antibiotics: Anti-infectives   Start     Dose/Rate Route Frequency Ordered Stop   10/21/13 1400  metroNIDAZOLE (FLAGYL) tablet 500 mg     500 mg Oral 3 times per day 10/21/13 1106     10/21/13 1230  levofloxacin (LEVAQUIN) tablet 750 mg     750 mg Oral Daily 10/21/13 1106     10/19/13 0100  vancomycin (VANCOCIN) IVPB 1000 mg/200 mL premix  Status:  Discontinued     1,000 mg 200 mL/hr over 60 Minutes Intravenous Every 12 hours 10/18/13 1142 10/21/13 1106   10/18/13 2000  meropenem (MERREM) 1 g in sodium chloride 0.9 % 100 mL IVPB  Status:  Discontinued     1 g 200 mL/hr over 30 Minutes Intravenous Every 8 hours 10/18/13 1142 10/21/13 1106   10/18/13 0930  vancomycin (VANCOCIN) 2,000 mg in sodium chloride 0.9 % 500 mL IVPB     2,000 mg 250 mL/hr over 120 Minutes Intravenous  Once 10/18/13 0847 10/18/13 1524   10/18/13 0930  meropenem (MERREM) 1 g in sodium chloride 0.9 % 100 mL IVPB     1 g 200 mL/hr over 30 Minutes Intravenous  Once 10/18/13 0847 10/18/13 1311   10/16/13 2000  vancomycin (VANCOCIN) 1,500 mg in sodium chloride 0.9 % 500 mL IVPB     1,500  mg 250 mL/hr over 120 Minutes Intravenous  Once 10/16/13 1653 10/16/13 2212   10/16/13 1645  levofloxacin (LEVAQUIN) tablet 500 mg  Status:  Discontinued     500 mg Oral Daily 10/16/13 1636 10/16/13 1713   10/16/13 0600  vancomycin (VANCOCIN) 1,500 mg in sodium chloride 0.9 % 500 mL IVPB     1,500 mg 250 mL/hr over 120 Minutes Intravenous On call to O.R. 10/15/13 1412 10/16/13 5956       Patient was given sequential compression devices, early ambulation to prevent DVT.  Patient benefited maximally from hospital stay and there was mild complication of aspiration PNA followed by IM team   Recent vital signs: Patient Vitals for the past 24 hrs:  BP Temp Temp src Pulse Resp SpO2  10/21/13 1331 132/63 mmHg 98.2 F (36.8 C) Oral 74 18 99 %  10/21/13 0559 118/51 mmHg 98.2 F (36.8 C) Oral 80 18 98 %  10/20/13 2123 144/66 mmHg 97.8 F  (36.6 C) Oral 87 18 97 %     Discharge Medications:     Medication List    STOP taking these medications       predniSONE 20 MG tablet  Commonly known as:  DELTASONE      TAKE these medications       anastrozole 1 MG tablet  Commonly known as:  ARIMIDEX  Take 1 tablet (1 mg total) by mouth daily.     atorvastatin 80 MG tablet  Commonly known as:  LIPITOR  Take 40 mg by mouth 3 (three) times a week. Tuesday, Thursday and Sunday     doxazosin 8 MG tablet  Commonly known as:  CARDURA  Take 4 mg by mouth at bedtime.     DULoxetine 30 MG capsule  Commonly known as:  CYMBALTA  Take 90 mg by mouth daily.     ENDOCET 5-325 MG per tablet  Generic drug:  oxyCODONE-acetaminophen  Take 1 tablet by mouth every 6 (six) hours as needed for severe pain.     gabapentin 800 MG tablet  Commonly known as:  NEURONTIN  Take 800 mg by mouth 3 (three) times daily.     levofloxacin 500 MG tablet  Commonly known as:  LEVAQUIN  Take 1 tablet (500 mg total) by mouth daily.     mometasone 50 MCG/ACT nasal spray  Commonly known as:  NASONEX  Place 1 spray into both nostrils 2 (two) times daily as needed (sinusitis).     omeprazole 40 MG capsule  Commonly known as:  PRILOSEC  Take 40 mg by mouth daily.     temazepam 30 MG capsule  Commonly known as:  RESTORIL  Take 30 mg by mouth at bedtime.     traMADol 50 MG tablet  Commonly known as:  ULTRAM  Take 50 mg by mouth 4 (four) times daily as needed for moderate pain (headaches).     verapamil 240 MG CR tablet  Commonly known as:  CALAN-SR  TAKE 1 TABLET BY MOUTH EVERY DAY WITH FOOD     Vitamin D 2000 UNITS tablet  Take 4,000 Units by mouth daily.        Diagnostic Studies: Dg Lumbar Spine 2-3 Views  10/16/2013   CLINICAL DATA:  Back pain.  EXAM: LUMBAR SPINE - 2-3 VIEW; DG C-ARM 61-120 MIN  COMPARISON:  MRI lumbar spine 08/06/2013.  FINDINGS: Cross-table film 1 demonstrates needles directed most closely at the L3 and L5 spinous  processes.  Cross-table film 2 demonstrates  an angled probe directed most closely toward the L4-5 interspace.  Spot radiographs from intraoperative C-arm demonstrate pedicle screw and rod construct across the L4-5 interspace.  IMPRESSION: L4-5 fusion.   Electronically Signed   By: Rolla Flatten M.D.   On: 10/16/2013 12:45   Ct Angio Chest Pe W/cm &/or Wo Cm  10/18/2013   CLINICAL DATA:  Shortness of breath.  EXAM: CT ANGIOGRAPHY CHEST WITH CONTRAST  TECHNIQUE: Multidetector CT imaging of the chest was performed using the standard protocol during bolus administration of intravenous contrast. Multiplanar CT image reconstructions and MIPs were obtained to evaluate the vascular anatomy.  CONTRAST:  155mL OMNIPAQUE IOHEXOL 350 MG/ML SOLN intravenously  COMPARISON:  Portable chest x-ray of October 18, 2013.  FINDINGS: Contrast within the pulmonary arterial tree is normal. There are no filling defects to suggest an acute pulmonary embolism. The caliber of the thoracic aorta is normal. There is no false lumen. The cardiac chambers are top-normal in size. There are faint coronary artery calcifications. The thoracic esophagus is normal. There is a small hiatal hernia. There is no lymphadenopathy. There is no pleural nor pericardial effusion.  At lung window settings there are patchy interstitial densities bilaterally with relative sparing of the left lower lobe. There are no pulmonary parenchymal nodules. There is no bronchiectasis.  Within the upper abdomen the visualized portion of the liver exhibits well-circumscribed hypodensities with HU values of +4 to +6 consistent with cysts. The observed portions of the adrenal glands and spleen are normal. The bony thorax is unremarkable.  Review of the MIP images confirms the above findings.  IMPRESSION: 1. Patchy interstitial and early alveolar densities in both lungs are consistent with pneumonia. 2. There is no acute pulmonary embolism nor acute thoracic aortic pathology. 3.  There is no CHF nor pleural or pericardial effusion.   Electronically Signed   By: David  Martinique   On: 10/18/2013 15:14   Dg Chest Port 1 View  10/19/2013   CLINICAL DATA:  Short of breath  EXAM: PORTABLE CHEST - 1 VIEW  COMPARISON:  CT 10/18/2013  FINDINGS: Normal cardiac silhouette. There is bilateral patchy airspace disease not changed from prior. Low lung volumes. No pneumothorax.  IMPRESSION: Stable bilateral patchy airspace disease consistent with bilateral pneumonia.   Electronically Signed   By: Suzy Bouchard M.D.   On: 10/19/2013 08:03   Dg Chest Port 1 View  10/18/2013   CLINICAL DATA:  Shortness of breath, postoperative lumbar surgery  EXAM: PORTABLE CHEST - 1 VIEW  COMPARISON:  03/19/2013  FINDINGS: Cardiac shadow is stable. The left lung is well aerated and clear. Increasing infiltrate is noted in the right lung base. Continued followup is recommended. The bony structures are within normal limits.  IMPRESSION: New right basilar infiltrate.  Followup films are recommended.   Electronically Signed   By: Inez Catalina M.D.   On: 10/18/2013 07:35   Dg C-arm 1-60 Min  10/16/2013   CLINICAL DATA:  Back pain.  EXAM: LUMBAR SPINE - 2-3 VIEW; DG C-ARM 61-120 MIN  COMPARISON:  MRI lumbar spine 08/06/2013.  FINDINGS: Cross-table film 1 demonstrates needles directed most closely at the L3 and L5 spinous processes.  Cross-table film 2 demonstrates an angled probe directed most closely toward the L4-5 interspace.  Spot radiographs from intraoperative C-arm demonstrate pedicle screw and rod construct across the L4-5 interspace.  IMPRESSION: L4-5 fusion.   Electronically Signed   By: Rolla Flatten M.D.   On: 10/16/2013 12:45  Disposition: SNF   POD #5 s/p L4-5 Decompression and Fusion  - IM following aspiration PNA   -Transition to PO ABX today  - Pt still anaemic minimally smptomatic with Hg 8.3 10/20/13 trending up 8.7 today - Cont PT/OT, encourage ambulation   -TLSO brace at all times when OOB  except PM trips to bathroom   -Back precautions  - Percocet for pain, Valium for muscle spasms PRN, px well controlled  -  d/c to SNF today pending release by IM to PO ABX -F/U in office 2 weeks   Signed: Justice Britain 10/21/2013, 4:32 PM

## 2013-10-21 NOTE — Progress Notes (Signed)
    Patient doing well from standpoint of lumbar sx, L leg pain resolved, minimal LBP. Notes feeling improved today with ABX therapy and decreased SOB, still feels a bit fatigued. Has been progressing diet after cleared by swallowing eval.    Physical Exam: Filed Vitals:   10/21/13 0559  BP: 118/51  Pulse: 80  Temp: 98.2 F (36.8 C)  Resp: 18    Dressing in place NVI  POD #5 s/p L4-5 Decompression and Fusion   - IM following aspiration PNA  -Transition to PO ABX today vs tomorrow  - Pt still anaemic and mildly symptomatic with Hg 8.3 10/20/13  -Will consider transfusion pending todays CBC results  - Cont PT/OT, encourage ambulation  -TLSO brace at all times when OOB except PM trips to bathroom   -Back precautions  - Percocet for pain, Valium for muscle spasms PRN, px well controlled  - likely d/c to SNF today vs tomorrow pending release by IM to PO ABX

## 2013-10-21 NOTE — Progress Notes (Signed)
Physical Therapy Treatment Patient Details Name: [name] [name] MRN: [id number] DOB: 07-27-1945 Today's Date: 11-10-13    History of Present Illness s/p L4-L5 fusion with facet cyst removal.     PT Comments    Making slow improvements with mobility and gait.  Follow Up Recommendations  SNF;Supervision/Assistance - 24 hour     Equipment Recommendations  3in1 (PT)    Recommendations for Other Services       Precautions / Restrictions Precautions Precautions: Back Precaution Comments: reviewed precautions with pt Required Braces or Orthoses: Spinal Brace Spinal Brace: Thoracolumbosacral orthotic;Applied in sitting position Restrictions Weight Bearing Restrictions: No    Mobility  Bed Mobility Overal bed mobility: Needs Assistance Bed Mobility: Sit to Sidelying         Sit to sidelying: Min assist General bed mobility comments: Assist to bring LE's onto bed.  Max assist to don/doff brace in sitting.  Transfers Overall transfer level: Needs assistance Equipment used: Rolling walker (2 wheeled) Transfers: Sit to/from Stand Sit to Stand: Min guard         General transfer comment: Verbal cues for hand placement and technique to maintain back precautions.  Ambulation/Gait Ambulation/Gait assistance: Min guard Ambulation Distance (Feet): 40 Feet Assistive device: Rolling walker (2 wheeled) Gait Pattern/deviations: Step-through pattern;Decreased step length - right;Decreased step length - left;Decreased stride length;Shuffle Gait velocity: Decreased Gait velocity interpretation: Below normal speed for age/gender General Gait Details: Verbal cues to stand upright.  Slow deliberate gait.   Stairs            Wheelchair Mobility    Modified Rankin (Stroke Patients Only)       Balance                                    Cognition Arousal/Alertness: Awake/alert Behavior During Therapy: WFL for tasks assessed/performed Overall  Cognitive Status: Within Functional Limits for tasks assessed                      Exercises      General Comments        Pertinent Vitals/Pain Pain 8/10.  RN notified    Home Living                      Prior Function            PT Goals (current goals can now be found in the care plan section) Progress towards PT goals: Progressing toward goals    Frequency  Min 5X/week    PT Plan Current plan remains appropriate    Co-evaluation             End of Session Equipment Utilized During Treatment: Gait belt;Back brace Activity Tolerance: Patient limited by pain;Patient limited by fatigue Patient left: in bed;with call bell/phone within reach     Time: 1719-1743 PT Time Calculation (min): 24 min  Charges:  $Gait Training: 23-37 mins                    G Codes:      Despina Pole 2013-11-10, 6:32 PM Carita Pian. Sanjuana Kava, [place] Pager [phone]

## 2013-10-21 NOTE — Progress Notes (Signed)
PATIENT DETAILS Name: [name] [name] Age: 68 y.o. Sex: female Date of Birth: 1945-08-31 Admit Date: 10/16/2013 Admitting Physician Sinclair Ship, MD EHU:[id number] DAVID, MD  Subjective: Improving rapidly, no major complaints  Assessment/Plan: Active Problems:   Suspected Aspiration PNA -significantly improved,was onempiric vancomycin and meropenem-started 7/10,since-afebrile, leukocytosis significantly decreased, ok to switch to Levaquin and flagyl, stop date for antibiotic therapy 10/24/13. Ok to discharge to SNF.I will sign off.  -blood culture on 7/10 negative -seen by SLP on 7/11-now on regular diet with thin liquid  SIR's -secondary to above -resolved with IVF, Antibiotics  Acute Hypoxic resp failure -secondary to PNA -CTA Chest neg for PE -supportive care, O2 as needed  Tachycardia -secondary to fever -resolved with treatment of PNA  Elective lumbar fusion surgery - Defer further management to primary team. Consider chemical prophylaxis for DVT if safe.  History of paroxysmal supraventricular tachycardia -stable - continue verapamil  History of dyslipidemia - c/w statin    GERD - On PPI.  Morbid Obesity -counseled regarding weight loss  Disposition: Remain inpatient-defer to primary service  DVT Prophylaxis:  SCD's  Code Status: Full code  Family Communication None at bedside  Procedures:  None  MEDICATIONS: Scheduled Meds: . anastrozole  1 mg Oral Daily  . atorvastatin  40 mg Oral Q T,Th,Sat-1800  . bisacodyl  10 mg Rectal Daily  . cholecalciferol  4,000 Units Oral Daily  . docusate sodium  200 mg Oral BID  . doxazosin  4 mg Oral QHS  . DULoxetine  90 mg Oral Daily  . fluticasone  1 spray Each Nare Daily  . gabapentin  800 mg Oral TID  . meropenem (MERREM) IV  1 g Intravenous Q8H  . metoprolol tartrate  25 mg Oral BID  . pantoprazole  80 mg Oral QAC breakfast  . polyethylene glycol  17 g Oral BID  .  temazepam  30 mg Oral QHS  . vancomycin  1,000 mg Intravenous Q12H  . verapamil  240 mg Oral Daily   Continuous Infusions:  PRN Meds:.acetaminophen, acetaminophen, albuterol, alum & mag hydroxide-simeth, diazepam, naloxone, ondansetron (ZOFRAN) IV, oxyCODONE-acetaminophen, senna-docusate, zolpidem  Antibiotics: Anti-infectives   Start     Dose/Rate Route Frequency Ordered Stop   10/19/13 0100  vancomycin (VANCOCIN) IVPB 1000 mg/200 mL premix     1,000 mg 200 mL/hr over 60 Minutes Intravenous Every 12 hours 10/18/13 1142     10/18/13 2000  meropenem (MERREM) 1 g in sodium chloride 0.9 % 100 mL IVPB     1 g 200 mL/hr over 30 Minutes Intravenous Every 8 hours 10/18/13 1142     10/18/13 0930  vancomycin (VANCOCIN) 2,000 mg in sodium chloride 0.9 % 500 mL IVPB     2,000 mg 250 mL/hr over 120 Minutes Intravenous  Once 10/18/13 0847 10/18/13 1524   10/18/13 0930  meropenem (MERREM) 1 g in sodium chloride 0.9 % 100 mL IVPB     1 g 200 mL/hr over 30 Minutes Intravenous  Once 10/18/13 0847 10/18/13 1311   10/16/13 2000  vancomycin (VANCOCIN) 1,500 mg in sodium chloride 0.9 % 500 mL IVPB     1,500 mg 250 mL/hr over 120 Minutes Intravenous  Once 10/16/13 1653 10/16/13 2212   10/16/13 1645  levofloxacin (LEVAQUIN) tablet 500 mg  Status:  Discontinued     500 mg Oral Daily 10/16/13 1636 10/16/13 1713   10/16/13 0600  vancomycin (VANCOCIN) 1,500 mg in sodium chloride 0.9 %  500 mL IVPB     1,500 mg 250 mL/hr over 120 Minutes Intravenous On call to O.R. 10/15/13 1412 10/16/13 0833       PHYSICAL EXAM: Vital signs in last 24 hours: Filed Vitals:   10/20/13 0615 10/20/13 1357 10/20/13 2123 10/21/13 0559  BP: 118/53 123/61 144/66 118/51  Pulse: 85 69 87 80  Temp: 98.4 F (36.9 C) 98.1 F (36.7 C) 97.8 F (36.6 C) 98.2 F (36.8 C)  TempSrc: Oral  Oral Oral  Resp: 18 18 18 18   Height:      Weight:      SpO2: 88% 98% 97% 98%    Weight change:  Filed Weights   10/16/13 0629  Weight:  101.152 kg (223 lb)   Body mass index is 35.84 kg/(m^2).   Gen Exam: Awake and alert with clear speech.  Neck: Supple, No JVD.   Chest: Right base rales, but otherwise clear CVS: S1 S2 Regular, no murmurs.  Abdomen: soft, BS +, non tender, non distended.  Extremities: no edema, lower extremities warm to touch. Neurologic: Non Focal.   Skin: No Rash.   Wounds: N/A.    Intake/Output from previous day:  Intake/Output Summary (Last 24 hours) at 10/21/13 1026 Last data filed at 10/21/13 0500  Gross per 24 hour  Intake    720 ml  Output      0 ml  Net    720 ml     LAB RESULTS: CBC  Recent Labs Lab 10/18/13 0845 10/19/13 0538 10/20/13 0915 10/21/13 0852  WBC 17.2* 16.1* 12.3* 7.5  HGB 7.8* 8.4* 8.3* 8.7*  HCT 24.5* 25.3* 26.0* 27.0*  PLT 264 221 268 281  MCV 86.3 85.2 85.5 86.3  MCH 27.5 28.3 27.3 27.8  MCHC 31.8 33.2 31.9 32.2  RDW 16.5* 17.1* 16.8* 16.8*    Chemistries   Recent Labs Lab 10/18/13 0845 10/19/13 0538 10/20/13 0915  NA 137 137 140  K 4.0 3.8 3.8  CL 99 100 101  CO2 22 25 25   GLUCOSE 103* 99 126*  BUN 9 9 8   CREATININE 0.85 0.70 0.68  CALCIUM 8.3* 8.3* 8.5  MG  --   --  1.9    CBG: No results found for this basename: GLUCAP,  in the last 168 hours  GFR Estimated Creatinine Clearance: 82.1 ml/min (by C-G formula based on Cr of 0.68).  Coagulation profile No results found for this basename: INR, PROTIME,  in the last 168 hours  Cardiac Enzymes No results found for this basename: CK, CKMB, TROPONINI, MYOGLOBIN,  in the last 168 hours  No components found with this basename: POCBNP,  No results found for this basename: DDIMER,  in the last 72 hours No results found for this basename: HGBA1C,  in the last 72 hours No results found for this basename: CHOL, HDL, LDLCALC, TRIG, CHOLHDL, LDLDIRECT,  in the last 72 hours No results found for this basename: TSH, T4TOTAL, FREET3, T3FREE, THYROIDAB,  in the last 72 hours  Recent Labs   10/19/13 1017  VITAMINB12 729  FOLATE >20.0  FERRITIN 216  TIBC 262  IRON 45  RETICCTPCT 1.0   No results found for this basename: LIPASE, AMYLASE,  in the last 72 hours  Urine Studies No results found for this basename: UACOL, UAPR, USPG, UPH, UTP, UGL, UKET, UBIL, UHGB, UNIT, UROB, ULEU, UEPI, UWBC, URBC, UBAC, CAST, CRYS, UCOM, BILUA,  in the last 72 hours  MICROBIOLOGY: Recent Results (from the past 240 hour(s))  CULTURE, BLOOD (ROUTINE X 2)     Status: None   Collection Time    10/18/13  8:40 AM      Result Value Ref Range Status   Specimen Description BLOOD RIGHT ARM   Final   Special Requests BOTTLES DRAWN AEROBIC ONLY 5CC   Final   Culture  Setup Time     Final   Value: 10/18/2013 12:14     Performed at [facility]   Culture     Final   Value:        BLOOD CULTURE RECEIVED NO GROWTH TO DATE CULTURE WILL BE HELD FOR 5 DAYS BEFORE ISSUING A FINAL NEGATIVE REPORT     Performed at [facility]   Report Status PENDING   Incomplete  CULTURE, BLOOD (ROUTINE X 2)     Status: None   Collection Time    10/18/13  8:45 AM      Result Value Ref Range Status   Specimen Description BLOOD RIGHT HAND   Final   Special Requests BOTTLES DRAWN AEROBIC ONLY 10CC   Final   Culture  Setup Time     Final   Value: 10/18/2013 12:14     Performed at [facility]   Culture     Final   Value:        BLOOD CULTURE RECEIVED NO GROWTH TO DATE CULTURE WILL BE HELD FOR 5 DAYS BEFORE ISSUING A FINAL NEGATIVE REPORT     Performed at [facility]   Report Status PENDING   Incomplete  CULTURE, EXPECTORATED SPUTUM-ASSESSMENT     Status: None   Collection Time    10/19/13  5:21 PM      Result Value Ref Range Status   Specimen Description SPUTUM   Final   Special Requests NONE   Final   Sputum evaluation     Final   Value: THIS SPECIMEN IS ACCEPTABLE. RESPIRATORY CULTURE REPORT TO FOLLOW.   Report Status 10/19/2013 FINAL   Final  CULTURE, RESPIRATORY  (NON-EXPECTORATED)     Status: None   Collection Time    10/19/13  5:21 PM      Result Value Ref Range Status   Specimen Description SPUTUM   Final   Special Requests NONE   Final   Gram Stain     Final   Value: FEW WBC PRESENT, PREDOMINANTLY PMN     FEW SQUAMOUS EPITHELIAL CELLS PRESENT     FEW GRAM POSITIVE COCCI IN PAIRS     FEW GRAM POSITIVE RODS     Performed at [facility]   Culture     Final   Value: NORMAL OROPHARYNGEAL FLORA     Performed at [facility]   Report Status PENDING   Incomplete    RADIOLOGY STUDIES/RESULTS: Dg Lumbar Spine 2-3 Views  10/16/2013   CLINICAL DATA:  Back pain.  EXAM: LUMBAR SPINE - 2-3 VIEW; DG C-ARM 61-120 MIN  COMPARISON:  MRI lumbar spine 08/06/2013.  FINDINGS: Cross-table film 1 demonstrates needles directed most closely at the L3 and L5 spinous processes.  Cross-table film 2 demonstrates an angled probe directed most closely toward the L4-5 interspace.  Spot radiographs from intraoperative C-arm demonstrate pedicle screw and rod construct across the L4-5 interspace.  IMPRESSION: L4-5 fusion.   Electronically Signed   By: Rolla Flatten M.D.   On: 10/16/2013 12:45   Ct Angio Chest Pe W/cm &/or Wo Cm  10/18/2013   CLINICAL DATA:  Shortness of breath.  EXAM: CT ANGIOGRAPHY CHEST WITH CONTRAST  TECHNIQUE: Multidetector CT imaging of the chest was performed using the standard protocol during bolus administration of intravenous contrast. Multiplanar CT image reconstructions and MIPs were obtained to evaluate the vascular anatomy.  CONTRAST:  145mL OMNIPAQUE IOHEXOL 350 MG/ML SOLN intravenously  COMPARISON:  Portable chest x-ray of October 18, 2013.  FINDINGS: Contrast within the pulmonary arterial tree is normal. There are no filling defects to suggest an acute pulmonary embolism. The caliber of the thoracic aorta is normal. There is no false lumen. The cardiac chambers are top-normal in size. There are faint coronary artery calcifications. The  thoracic esophagus is normal. There is a small hiatal hernia. There is no lymphadenopathy. There is no pleural nor pericardial effusion.  At lung window settings there are patchy interstitial densities bilaterally with relative sparing of the left lower lobe. There are no pulmonary parenchymal nodules. There is no bronchiectasis.  Within the upper abdomen the visualized portion of the liver exhibits well-circumscribed hypodensities with HU values of +4 to +6 consistent with cysts. The observed portions of the adrenal glands and spleen are normal. The bony thorax is unremarkable.  Review of the MIP images confirms the above findings.  IMPRESSION: 1. Patchy interstitial and early alveolar densities in both lungs are consistent with pneumonia. 2. There is no acute pulmonary embolism nor acute thoracic aortic pathology. 3. There is no CHF nor pleural or pericardial effusion.   Electronically Signed   By: David  Martinique   On: 10/18/2013 15:14   Dg Chest Port 1 View  10/19/2013   CLINICAL DATA:  Short of breath  EXAM: PORTABLE CHEST - 1 VIEW  COMPARISON:  CT 10/18/2013  FINDINGS: Normal cardiac silhouette. There is bilateral patchy airspace disease not changed from prior. Low lung volumes. No pneumothorax.  IMPRESSION: Stable bilateral patchy airspace disease consistent with bilateral pneumonia.   Electronically Signed   By: Suzy Bouchard M.D.   On: 10/19/2013 08:03   Dg Chest Port 1 View  10/18/2013   CLINICAL DATA:  Shortness of breath, postoperative lumbar surgery  EXAM: PORTABLE CHEST - 1 VIEW  COMPARISON:  03/19/2013  FINDINGS: Cardiac shadow is stable. The left lung is well aerated and clear. Increasing infiltrate is noted in the right lung base. Continued followup is recommended. The bony structures are within normal limits.  IMPRESSION: New right basilar infiltrate.  Followup films are recommended.   Electronically Signed   By: Inez Catalina M.D.   On: 10/18/2013 07:35   Dg C-arm 1-60 Min  10/16/2013    CLINICAL DATA:  Back pain.  EXAM: LUMBAR SPINE - 2-3 VIEW; DG C-ARM 61-120 MIN  COMPARISON:  MRI lumbar spine 08/06/2013.  FINDINGS: Cross-table film 1 demonstrates needles directed most closely at the L3 and L5 spinous processes.  Cross-table film 2 demonstrates an angled probe directed most closely toward the L4-5 interspace.  Spot radiographs from intraoperative C-arm demonstrate pedicle screw and rod construct across the L4-5 interspace.  IMPRESSION: L4-5 fusion.   Electronically Signed   By: Rolla Flatten M.D.   On: 10/16/2013 12:45    Oren Binet, MD  [facility] Pager:[phone]  If 7PM-7AM, please contact night-coverage [email or web address] Password [email or web address] 10/21/2013, 10:26 AM   LOS: 5 days   **Disclaimer: This note may have been dictated with voice recognition software. Similar sounding words can inadvertently be transcribed and this note may contain transcription errors which may not have been corrected upon publication of note.**

## 2013-10-22 DIAGNOSIS — M1990 Unspecified osteoarthritis, unspecified site: Secondary | ICD-10-CM | POA: Diagnosis not present

## 2013-10-22 DIAGNOSIS — F329 Major depressive disorder, single episode, unspecified: Secondary | ICD-10-CM | POA: Diagnosis not present

## 2013-10-22 DIAGNOSIS — C50919 Malignant neoplasm of unspecified site of unspecified female breast: Secondary | ICD-10-CM | POA: Diagnosis not present

## 2013-10-22 DIAGNOSIS — I10 Essential (primary) hypertension: Secondary | ICD-10-CM | POA: Diagnosis not present

## 2013-10-22 DIAGNOSIS — M539 Dorsopathy, unspecified: Secondary | ICD-10-CM | POA: Diagnosis not present

## 2013-10-22 DIAGNOSIS — IMO0002 Reserved for concepts with insufficient information to code with codable children: Secondary | ICD-10-CM | POA: Diagnosis not present

## 2013-10-22 DIAGNOSIS — M545 Low back pain, unspecified: Secondary | ICD-10-CM | POA: Diagnosis not present

## 2013-10-22 DIAGNOSIS — G4700 Insomnia, unspecified: Secondary | ICD-10-CM | POA: Diagnosis not present

## 2013-10-22 DIAGNOSIS — M159 Polyosteoarthritis, unspecified: Secondary | ICD-10-CM | POA: Diagnosis not present

## 2013-10-22 DIAGNOSIS — Z4789 Encounter for other orthopedic aftercare: Secondary | ICD-10-CM | POA: Diagnosis not present

## 2013-10-22 DIAGNOSIS — M6281 Muscle weakness (generalized): Secondary | ICD-10-CM | POA: Diagnosis not present

## 2013-10-22 DIAGNOSIS — Z9889 Other specified postprocedural states: Secondary | ICD-10-CM | POA: Diagnosis not present

## 2013-10-22 DIAGNOSIS — K219 Gastro-esophageal reflux disease without esophagitis: Secondary | ICD-10-CM | POA: Diagnosis not present

## 2013-10-22 LAB — CULTURE, RESPIRATORY W GRAM STAIN

## 2013-10-22 LAB — CULTURE, RESPIRATORY: Culture: NORMAL

## 2013-10-22 NOTE — Progress Notes (Signed)
Physical Therapy Treatment Patient Details Name: [name] [name] MRN: [id number] DOB: 07-05-1945 Today's Date: 10/22/2013    History of Present Illness s/p L4-L5 fusion with facet cyst removal.     PT Comments    Pt was much more alert and motivated to get up and amb today. Pt pushed to increase amb distance today. Pt needed verbal cues to stay in walker and not lean too far forward when amb. Continue to recommend SNF for ongoing Physical Therapy.    Follow Up Recommendations  SNF;Supervision/Assistance - 24 hour     Equipment Recommendations  3in1 (PT)    Recommendations for Other Services       Precautions / Restrictions Precautions Precautions: Back Required Braces or Orthoses: Spinal Brace Spinal Brace: Thoracolumbosacral orthotic;Applied in sitting position Restrictions Weight Bearing Restrictions: No    Mobility  Bed Mobility Overal bed mobility: Needs Assistance Bed Mobility: Sidelying to Sit;Sit to Sidelying;Rolling Rolling: Min guard Sidelying to sit: Supervision     Sit to sidelying: Supervision General bed mobility comments: Min guard for safety when rolling to prevent twisting. Verbal cues for LE when going from sit to sidelying. Pt needed assistance with shifting hips in bed when in supine.  Transfers Overall transfer level: Needs assistance Equipment used: Rolling walker (2 wheeled) Transfers: Sit to/from Stand Sit to Stand: Min guard         General transfer comment: verbal cues for technique and to make sure back precautions are maintained. Min guard for safety.   Ambulation/Gait Ambulation/Gait assistance: Min guard Ambulation Distance (Feet): 50 Feet Assistive device: Rolling walker (2 wheeled) Gait Pattern/deviations: Step-to pattern;Decreased stride length Gait velocity: Decreased Gait velocity interpretation: Below normal speed for age/gender General Gait Details: verbal cues to stand upright. Pt walks slow with precaution and says  there is no pain but is aware of pain in back.    Stairs            Wheelchair Mobility    Modified Rankin (Stroke Patients Only)       Balance                                    Cognition Arousal/Alertness: Awake/alert Behavior During Therapy: WFL for tasks assessed/performed Overall Cognitive Status: Within Functional Limits for tasks assessed                      Exercises      General Comments        Pertinent Vitals/Pain Pt. Did not state number of pain, but said "pain has gotten much better and able to move around easier." Pt repositioned for comfort in bed in supine.     Home Living                      Prior Function            PT Goals (current goals can now be found in the care plan section) Progress towards PT goals: Progressing toward goals    Frequency  Min 5X/week    PT Plan Current plan remains appropriate    Co-evaluation             End of Session Equipment Utilized During Treatment: Back brace Activity Tolerance: Patient tolerated treatment well Patient left: in bed;with call bell/phone within reach     Time: 1884-1660 PT Time Calculation (min): 14 min  Charges:  G Codes:      BRASFIELD,Chassity Ludke,SPTA 10/22/2013, 12:59 PM

## 2013-10-22 NOTE — Progress Notes (Signed)
Seen and agreed 10/22/2013 Jacqualyn Posey PTA [phone] pager [phone] office

## 2013-10-24 LAB — CULTURE, BLOOD (ROUTINE X 2)
CULTURE: NO GROWTH
Culture: NO GROWTH

## 2013-10-25 DIAGNOSIS — Z9889 Other specified postprocedural states: Secondary | ICD-10-CM | POA: Diagnosis not present

## 2013-10-25 DIAGNOSIS — M1990 Unspecified osteoarthritis, unspecified site: Secondary | ICD-10-CM | POA: Diagnosis not present

## 2013-10-25 DIAGNOSIS — I10 Essential (primary) hypertension: Secondary | ICD-10-CM | POA: Diagnosis not present

## 2013-10-25 DIAGNOSIS — K219 Gastro-esophageal reflux disease without esophagitis: Secondary | ICD-10-CM | POA: Diagnosis not present

## 2013-10-25 DIAGNOSIS — F329 Major depressive disorder, single episode, unspecified: Secondary | ICD-10-CM | POA: Diagnosis not present

## 2013-10-28 ENCOUNTER — Telehealth: Payer: Self-pay | Admitting: *Deleted

## 2013-10-28 NOTE — Telephone Encounter (Signed)
Called pt to f/u with her about getting a bone density scheduled for her. Pt had a spinal fusion done 2 weeks ago and is still recovering at a Rehab facility. I will schedule a bone density for pt after she is d/c from facility. Anticipated date of discharge is Nov 11, 2013. Pt was in agreement with this plan. Message to be forwarded to Charlestine Massed, NP.

## 2013-11-07 DIAGNOSIS — Q7649 Other congenital malformations of spine, not associated with scoliosis: Secondary | ICD-10-CM | POA: Diagnosis not present

## 2013-11-07 DIAGNOSIS — K219 Gastro-esophageal reflux disease without esophagitis: Secondary | ICD-10-CM | POA: Diagnosis not present

## 2013-11-07 DIAGNOSIS — L298 Other pruritus: Secondary | ICD-10-CM | POA: Diagnosis not present

## 2013-11-15 ENCOUNTER — Other Ambulatory Visit: Payer: Self-pay | Admitting: Internal Medicine

## 2013-11-29 ENCOUNTER — Telehealth: Payer: Self-pay | Admitting: *Deleted

## 2013-11-29 NOTE — Telephone Encounter (Signed)
Called to f/u with pt. No answer, but left a detailed message to have pt to call this nurse back concerning making her an appt to have a bone density done. Pt has a spinal fusion in July and was in rehab for recovery. I had told her I would wait until she was back home before I make this future appt for her. Told pt to call this nurse back @ [phone]. Message to be forwarded to Charlestine Massed, NP.

## 2013-12-06 ENCOUNTER — Encounter (INDEPENDENT_AMBULATORY_CARE_PROVIDER_SITE_OTHER): Payer: Self-pay | Admitting: General Surgery

## 2013-12-06 DIAGNOSIS — M545 Low back pain, unspecified: Secondary | ICD-10-CM | POA: Diagnosis not present

## 2013-12-17 DIAGNOSIS — M1710 Unilateral primary osteoarthritis, unspecified knee: Secondary | ICD-10-CM | POA: Diagnosis not present

## 2013-12-17 DIAGNOSIS — M25569 Pain in unspecified knee: Secondary | ICD-10-CM | POA: Diagnosis not present

## 2013-12-18 ENCOUNTER — Encounter: Payer: Self-pay | Admitting: Internal Medicine

## 2013-12-18 ENCOUNTER — Other Ambulatory Visit: Payer: Self-pay | Admitting: Internal Medicine

## 2013-12-18 ENCOUNTER — Ambulatory Visit (INDEPENDENT_AMBULATORY_CARE_PROVIDER_SITE_OTHER): Payer: Medicare Other | Admitting: Internal Medicine

## 2013-12-18 VITALS — BP 136/74 | HR 92 | Temp 97.5°F | Resp 16 | Ht 65.5 in | Wt 221.0 lb

## 2013-12-18 DIAGNOSIS — E559 Vitamin D deficiency, unspecified: Secondary | ICD-10-CM | POA: Diagnosis not present

## 2013-12-18 DIAGNOSIS — R7309 Other abnormal glucose: Secondary | ICD-10-CM | POA: Insufficient documentation

## 2013-12-18 DIAGNOSIS — I10 Essential (primary) hypertension: Secondary | ICD-10-CM | POA: Diagnosis not present

## 2013-12-18 DIAGNOSIS — E785 Hyperlipidemia, unspecified: Secondary | ICD-10-CM | POA: Diagnosis not present

## 2013-12-18 DIAGNOSIS — R7303 Prediabetes: Secondary | ICD-10-CM

## 2013-12-18 DIAGNOSIS — M8580 Other specified disorders of bone density and structure, unspecified site: Secondary | ICD-10-CM

## 2013-12-18 DIAGNOSIS — Z79899 Other long term (current) drug therapy: Secondary | ICD-10-CM | POA: Diagnosis not present

## 2013-12-18 NOTE — Progress Notes (Signed)
Patient ID: [name] [name], female   DOB: [birth year], 68 y.o.   MRN: [id number]   This very nice 68 y.o.female presents for 3 month follow up with Hypertension, Hyperlipidemia, Pre-Diabetes and Vitamin D Deficiency. Patient had a colonoscopy in Nov 2014 and also that month was found to have a Left breast cancer treated by lumpectomy then radiation and subsequently Arimidex and is followed by Dr Jana Hakim. More recently she had a Lumbar Fusion 10/16/2013 by Dr Lorin Mercy.    Patient is treated for HTN & BP has been controlled at home. Today's BP: 136/74 mmHg. Patient denies any cardiac type chest pain, palpitations, dyspnea/orthopnea/PND, dizziness, claudication, or dependent edema.   Hyperlipidemia is not controlled with diet & meds - which she has take Atorvastatin 8o mg x 1/2 tab 3 x week. Patient denies myalgias or other med SE's. Last Lipids were not at goal - Total Chol  246*; HDL  53; LDL  166*; Trig 137 on 09/12/2013    Also, the patient has history of Morbid Obesity (BMI 36.2) and consequent PreDiabetes and patient denies any symptoms of reactive hypoglycemia, diabetic polys, paresthesias or visual blurring.  Last A1c was  6.3% on 09/12/2013.    Further, Patient has history of Vitamin D Deficiency and patient supplements vitamin D without any suspected side-effects. Last vitamin D was  67.   Medication List   anastrozole 1 MG tablet  Commonly known as:  ARIMIDEX  Take 1 tablet (1 mg total) by mouth daily.     atorvastatin 80 MG tablet  Commonly known as:  LIPITOR  TAKE 1 TABLET EVERY DAY     doxazosin 8 MG tablet  Commonly known as:  CARDURA  Take 4 mg by mouth at bedtime.     DULoxetine 30 MG capsule  Commonly known as:  CYMBALTA  Take 90 mg by mouth daily.     ENDOCET 5-325 MG per tablet  Generic drug:  oxyCODONE-acetaminophen  Take 1 tablet by mouth every 6 (six) hours as needed for severe pain.     gabapentin 800 MG tablet  Commonly known as:  NEURONTIN  Take 800 mg by mouth 3  (three) times daily.     levofloxacin 500 MG tablet  Commonly known as:  LEVAQUIN  Take 1 tablet (500 mg total) by mouth daily.     mometasone 50 MCG/ACT nasal spray  Commonly known as:  NASONEX  Place 1 spray into both nostrils 2 (two) times daily as needed (sinusitis).     omeprazole 40 MG capsule  Commonly known as:  PRILOSEC  Take 40 mg by mouth daily.     temazepam 30 MG capsule  Commonly known as:  RESTORIL  Take 30 mg by mouth at bedtime.     traMADol 50 MG tablet  Commonly known as:  ULTRAM  Take 50 mg by mouth 4 (four) times daily as needed for moderate pain (headaches).     verapamil 240 MG CR tablet  Commonly known as:  CALAN-SR  TAKE 1 TABLET BY MOUTH EVERY DAY WITH FOOD     Vitamin D 2000 UNITS tablet  Take 4,000 Units by mouth daily.     Allergies  Allergen Reactions  . Requip [Ropinirole Hcl] Shortness Of Breath and Nausea And Vomiting  . Codeine Other (See Comments)    fatigue  . Minocycline Hives  . Morphine Sulfate Other (See Comments)    Palpitations  . Tetracyclines & Related Hives    Pt doesn't remember a reaction  .  Zestril [Lisinopril] Cough  . Zetia [Ezetimibe] Other (See Comments)    Muscle and joint pain  . Amoxicillin Rash    Pt doesn't remember a reaction  . Sulfa Antibiotics Other (See Comments)    Headache (pt states that a blood pressure medicine caused a severe headache but doesn't remember a reaction to sulfa)   PMHx:   Past Medical History  Diagnosis Date  . Arthritis   . Anxiety   . Hyperlipidemia     takes Atorvastatin 3 times a week  . Shingles   . Anemia   . Allergy     Nasonex daily as needed  . Wears glasses   . PONV (postoperative nausea and vomiting)   . 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 S/P radiation therapy 07/18/2013-08/28/2013    1) Left Breast / 50 Gy in 25 fractions/ 2) Left Breast Boost / 10 Gy in 5 fractions  . Hypertension     takes Cardura nightly and  Verapamil daily  . History of shingles   . Sinus drainage     put on Levaquin yesterday if no better in 3 days will start prednisone  . Headache(784.0)     rare  . Weakness     tingling and numbness both hands and left leg  . Joint pain   . Chronic back pain     cyst sitting on L4-5;slipped disc  . GERD (gastroesophageal reflux disease)     takes Omeprazole daily  . History of gastric ulcer at age 35  . Diverticulosis   . Depression     takes Cymbalta daily  . Insomnia     takes Restoril nightly   FHx:    Reviewed / unchanged SHx:    Reviewed / unchanged  Systems Review:  Constitutional: Denies fever, chills, wt changes, headaches, insomnia, fatigue, night sweats, change in appetite. Eyes: Denies redness, blurred vision, diplopia, discharge, itchy, watery eyes.  ENT: Denies discharge, congestion, post nasal drip, epistaxis, sore throat, earache, hearing loss, dental pain, tinnitus, vertigo, sinus pain, snoring.  CV: Denies chest pain, palpitations, irregular heartbeat, syncope, dyspnea, diaphoresis, orthopnea, PND, claudication or edema. Respiratory: denies cough, dyspnea, DOE, pleurisy, hoarseness, laryngitis, wheezing.  Gastrointestinal: Denies dysphagia, odynophagia, heartburn, reflux, water brash, abdominal pain or cramps, nausea, vomiting, bloating, diarrhea, constipation, hematemesis, melena, hematochezia  or hemorrhoids. Genitourinary: Denies dysuria, frequency, urgency, nocturia, hesitancy, discharge, hematuria or flank pain. Musculoskeletal: Denies arthralgias, myalgias, stiffness, jt. swelling, pain, limping or strain/sprain.  Skin: Denies pruritus, rash, hives, warts, acne, eczema or change in skin lesion(s). Neuro: No weakness, tremor, incoordination, spasms, paresthesia or pain. Psychiatric: Denies confusion, memory loss or sensory loss. Endo: Denies change in weight, skin or hair change.  Heme/Lymph: No excessive bleeding, bruising or enlarged lymph  nodes.  Exam:  BP 136/74  Pulse 92  Temp 97.5 F   Resp 16  Ht 5' 5.5"   Wt 221 lb   BMI 36.20   Appears well nourished and in no distress. Eyes: PERRLA, EOMs, conjunctiva no swelling or erythema. Sinuses: No frontal/maxillary tenderness ENT/Mouth: EAC's clear, TM's nl w/o erythema, bulging. Nares clear w/o erythema, swelling, exudates. Oropharynx clear without erythema or exudates. Oral hygiene is good. Tongue normal, non obstructing. Hearing intact.  Neck: Supple. Thyroid nl. Car 2+/2+ without bruits, nodes or JVD. Chest: Respirations nl with BS clear & equal w/o rales, rhonchi, wheezing or stridor.  Cor: Heart sounds normal w/ regular rate and rhythm  without sig. murmurs, gallops, clicks, or rubs. Peripheral pulses normal and equal  without edema.  Abdomen: Soft & bowel sounds normal. Non-tender w/o guarding, rebound, hernias, masses, or organomegaly.  Lymphatics: Unremarkable.  Musculoskeletal: Full ROM all peripheral extremities, joint stability, 5/5 strength, and normal gait.  Skin: Warm, dry without exposed rashes, lesions or ecchymosis apparent.  Neuro: Cranial nerves intact, reflexes equal bilaterally. Sensory-motor testing grossly intact. Tendon reflexes grossly intact.  Pysch: Alert & oriented x 3.  Insight and judgement nl & appropriate. No ideations.   Assessment and Plan:  1. Hypertension - Continue monitor blood pressure at home. Continue diet/meds same.  2. Hyperlipidemia - Continue diet/meds, exercise,& lifestyle modifications. Continue monitor periodic cholesterol/liver & renal functions   3. Pre-Diabetes - Continue diet, exercise, lifestyle modifications. Monitor appropriate labs.  4. Vitamin D Deficiency - Continue supplementation.  Recommended regular exercise, BP monitoring, weight loss, and discussed med and SE's. Recommended labs to assess and monitor clinical status. Further disposition pending results of labs.

## 2013-12-18 NOTE — Patient Instructions (Signed)

## 2013-12-19 LAB — HEMOGLOBIN A1C
Hgb A1c MFr Bld: 6.1 % — ABNORMAL HIGH (ref <5.7)
MEAN PLASMA GLUCOSE: 128 mg/dL — AB (ref <117)

## 2013-12-19 LAB — HEPATIC FUNCTION PANEL
ALBUMIN: 4.4 g/dL (ref 3.5–5.2)
ALT: 10 U/L (ref 0–35)
AST: 14 U/L (ref 0–37)
Alkaline Phosphatase: 99 U/L (ref 39–117)
Bilirubin, Direct: 0.1 mg/dL (ref 0.0–0.3)
Indirect Bilirubin: 0.2 mg/dL (ref 0.2–1.2)
TOTAL PROTEIN: 7.7 g/dL (ref 6.0–8.3)
Total Bilirubin: 0.3 mg/dL (ref 0.2–1.2)

## 2013-12-19 LAB — CBC WITH DIFFERENTIAL/PLATELET
BASOS ABS: 0 10*3/uL (ref 0.0–0.1)
BASOS PCT: 0 % (ref 0–1)
Eosinophils Absolute: 0 10*3/uL (ref 0.0–0.7)
Eosinophils Relative: 0 % (ref 0–5)
HCT: 33.1 % — ABNORMAL LOW (ref 36.0–46.0)
Hemoglobin: 10.6 g/dL — ABNORMAL LOW (ref 12.0–15.0)
Lymphocytes Relative: 3 % — ABNORMAL LOW (ref 12–46)
Lymphs Abs: 0.5 10*3/uL — ABNORMAL LOW (ref 0.7–4.0)
MCH: 27.7 pg (ref 26.0–34.0)
MCHC: 32 g/dL (ref 30.0–36.0)
MCV: 86.6 fL (ref 78.0–100.0)
MONO ABS: 1 10*3/uL (ref 0.1–1.0)
Monocytes Relative: 6 % (ref 3–12)
NEUTROS ABS: 14.6 10*3/uL — AB (ref 1.7–7.7)
Neutrophils Relative %: 91 % — ABNORMAL HIGH (ref 43–77)
Platelets: 320 10*3/uL (ref 150–400)
RBC: 3.82 MIL/uL — ABNORMAL LOW (ref 3.87–5.11)
RDW: 16.5 % — AB (ref 11.5–15.5)
WBC: 16 10*3/uL — ABNORMAL HIGH (ref 4.0–10.5)

## 2013-12-19 LAB — BASIC METABOLIC PANEL WITH GFR
BUN: 23 mg/dL (ref 6–23)
CALCIUM: 10 mg/dL (ref 8.4–10.5)
CO2: 25 mEq/L (ref 19–32)
Chloride: 101 mEq/L (ref 96–112)
Creat: 0.88 mg/dL (ref 0.50–1.10)
GFR, EST AFRICAN AMERICAN: 79 mL/min
GFR, EST NON AFRICAN AMERICAN: 68 mL/min
Glucose, Bld: 100 mg/dL — ABNORMAL HIGH (ref 70–99)
POTASSIUM: 4.5 meq/L (ref 3.5–5.3)
Sodium: 137 mEq/L (ref 135–145)

## 2013-12-19 LAB — MAGNESIUM: MAGNESIUM: 1.8 mg/dL (ref 1.5–2.5)

## 2013-12-19 LAB — LIPID PANEL
CHOLESTEROL: 221 mg/dL — AB (ref 0–200)
HDL: 56 mg/dL (ref >39)
LDL Cholesterol: 139 mg/dL — ABNORMAL HIGH (ref 0–99)
TRIGLYCERIDES: 130 mg/dL (ref <150)
Total CHOL/HDL Ratio: 3.9 Ratio
VLDL: 26 mg/dL (ref 0–40)

## 2013-12-19 LAB — INSULIN, FASTING: INSULIN FASTING, SERUM: 35.1 u[IU]/mL — AB (ref 2.0–19.6)

## 2013-12-19 LAB — VITAMIN D 25 HYDROXY (VIT D DEFICIENCY, FRACTURES): VIT D 25 HYDROXY: 60 ng/mL (ref 30–89)

## 2013-12-19 LAB — TSH: TSH: 0.612 u[IU]/mL (ref 0.350–4.500)

## 2014-01-06 DIAGNOSIS — M1710 Unilateral primary osteoarthritis, unspecified knee: Secondary | ICD-10-CM | POA: Diagnosis not present

## 2014-01-07 ENCOUNTER — Ambulatory Visit (HOSPITAL_COMMUNITY)
Admission: RE | Admit: 2014-01-07 | Discharge: 2014-01-07 | Disposition: A | Discharge status: Home / Self Care | Payer: Medicare Other | Source: Ambulatory Visit | Attending: Internal Medicine | Admitting: Internal Medicine

## 2014-01-07 DIAGNOSIS — Z13820 Encounter for screening for osteoporosis: Secondary | ICD-10-CM | POA: Insufficient documentation

## 2014-01-07 DIAGNOSIS — M8580 Other specified disorders of bone density and structure, unspecified site: Secondary | ICD-10-CM

## 2014-01-07 DIAGNOSIS — Z780 Asymptomatic menopausal state: Secondary | ICD-10-CM | POA: Diagnosis not present

## 2014-01-13 ENCOUNTER — Other Ambulatory Visit (HOSPITAL_BASED_OUTPATIENT_CLINIC_OR_DEPARTMENT_OTHER): Payer: Medicare Other

## 2014-01-13 ENCOUNTER — Telehealth: Payer: Self-pay | Admitting: Oncology

## 2014-01-13 ENCOUNTER — Ambulatory Visit (HOSPITAL_BASED_OUTPATIENT_CLINIC_OR_DEPARTMENT_OTHER): Payer: Medicare Other | Admitting: Oncology

## 2014-01-13 VITALS — BP 139/62 | HR 79 | Temp 98.5°F | Resp 18 | Ht 65.5 in | Wt 221.4 lb

## 2014-01-13 DIAGNOSIS — Z170 Estrogen receptor positive status [ER+]: Secondary | ICD-10-CM | POA: Diagnosis not present

## 2014-01-13 DIAGNOSIS — C50312 Malignant neoplasm of lower-inner quadrant of left female breast: Secondary | ICD-10-CM | POA: Diagnosis not present

## 2014-01-13 DIAGNOSIS — M545 Low back pain: Secondary | ICD-10-CM | POA: Diagnosis not present

## 2014-01-13 LAB — COMPREHENSIVE METABOLIC PANEL (CC13)
ALK PHOS: 102 U/L (ref 40–150)
ALT: 13 U/L (ref 0–55)
AST: 14 U/L (ref 5–34)
Albumin: 3.5 g/dL (ref 3.5–5.0)
Anion Gap: 8 mEq/L (ref 3–11)
BUN: 14.4 mg/dL (ref 7.0–26.0)
CALCIUM: 9.4 mg/dL (ref 8.4–10.4)
CHLORIDE: 105 meq/L (ref 98–109)
CO2: 27 mEq/L (ref 22–29)
Creatinine: 0.8 mg/dL (ref 0.6–1.1)
Glucose: 99 mg/dl (ref 70–140)
Potassium: 3.5 mEq/L (ref 3.5–5.1)
Sodium: 140 mEq/L (ref 136–145)
Total Bilirubin: 0.22 mg/dL (ref 0.20–1.20)
Total Protein: 7 g/dL (ref 6.4–8.3)

## 2014-01-13 LAB — CBC WITH DIFFERENTIAL/PLATELET
BASO%: 0.2 % (ref 0.0–2.0)
BASOS ABS: 0 10*3/uL (ref 0.0–0.1)
EOS%: 4 % (ref 0.0–7.0)
Eosinophils Absolute: 0.3 10*3/uL (ref 0.0–0.5)
HEMATOCRIT: 30.7 % — AB (ref 34.8–46.6)
HGB: 9.9 g/dL — ABNORMAL LOW (ref 11.6–15.9)
LYMPH%: 17.3 % (ref 14.0–49.7)
MCH: 28.1 pg (ref 25.1–34.0)
MCHC: 32.2 g/dL (ref 31.5–36.0)
MCV: 87.5 fL (ref 79.5–101.0)
MONO#: 0.7 10*3/uL (ref 0.1–0.9)
MONO%: 10.4 % (ref 0.0–14.0)
NEUT%: 68.1 % (ref 38.4–76.8)
NEUTROS ABS: 4.5 10*3/uL (ref 1.5–6.5)
PLATELETS: 256 10*3/uL (ref 145–400)
RBC: 3.51 10*6/uL — ABNORMAL LOW (ref 3.70–5.45)
RDW: 15.2 % — ABNORMAL HIGH (ref 11.2–14.5)
WBC: 6.6 10*3/uL (ref 3.9–10.3)
lymph#: 1.1 10*3/uL (ref 0.9–3.3)

## 2014-01-13 MED ORDER — CALCIUM CARBONATE-VITAMIN D 600-400 MG-UNIT PO CHEW: 1.0000 | CHEWABLE_TABLET | Freq: Every day | ORAL | Status: DC

## 2014-01-13 MED ORDER — ANASTROZOLE 1 MG PO TABS: 1.0000 mg | ORAL_TABLET | Freq: Every day | ORAL | Status: DC

## 2014-01-13 NOTE — Progress Notes (Signed)
[facility]  [phone] [phone]) [phone]     ID: [name] [name] DOB: Apr 19, 1945  MR#: [id number]  OYD#:[id number]  Patient Care Team: Unk Pinto, MD as PCP - General (Internal Medicine) PCP: Alesia Richards, MD GYN: SU: Rolm Bookbinder OTHER MD: Eppie Gibson, Phylliss Bob (ortho)  CHIEF COMPLAINT: Estrogen receptor positive breast cancer  CURRENT TREATMENT: Anastrozole   BREAST CANCER HISTORY: From doctor Khan's earlier note:  "[name] [name] is a 68 y.o. female with  1. A past medical history significant for anxiety GERD shingles anemia and hypertension. Patient also has a family history significant for breast cancer in a sister. Patient underwent a screening mammogram that showed a breast density category C. She was noted to have a mass in the lower inner quadrant of the left breast with spiculated margins and associated microcalcifications. This measures 10 mm. She had an ultrasound performed that showed irregular hypoechoic mass at the 9:00 position 67 cm from the nipple. This measured 5 x 4 x 5 mm. She had a core needle biopsy performed that revealed invasive lobular carcinoma, grade 2, tumor was estrogen receptor positive progesterone receptor positive HER-2/neu negative with a proliferation marker Ki-67 14%. Patient was seen by Dr. Rolm Bookbinder on 05/22/2013 to discuss surgical treatment options. Since then she underwent a left breast radioactive seed guided lumpectomy and axillary sentinel lymph node biopsy. Her final pathology did reveal a 1.8 cm, grade 1, invasive lobular carcinoma Sentinel node was negative for metastatic disease all margins were clear. Tumor again was ER positive PR positive HER-2/neu negative. She overall tolerated the procedure well.  2. Patient underwent adjuvant radiation therapy under the care of Dr. Isidore Moos from 07/18/13 through 08/28/13."  Her subsequent history is as detailed below  INTERVAL  HISTORY: [name] [name] returns today for followup of her breast cancer. She is establishing herself in my service today. She started anastrozole in June of 2015. Initially she had significant hot flashes but these have now become much milder. They don't wake her up at night. Vaginal dryness is not an issue. She does not have arthralgias or myalgias associated with the anastrozole although she does have significant knee and of course back problems.  REVIEW OF SYSTEMS: Since her last visit here she underwent spinal fusion therapy. This was complicated by anemia requiring transfusion, and pneumonia. She was in a brace for several weeks, which was not found she says because of the hot flashes she was experiencing from the anastrozole. She underwent rehabilitation at [facility] for 2 weeks with good results. Currently she walks her dog everyday as her main form of exercise. She's not allowed to lift more than 10 pounds. She is still undergoing rehabilitation at [facility]. From a breast cancer point of view she has tenderness in the lateral aspect of the left breast and a little bit in the axilla as well. This is worse with stretching. A detailed review of systems today was otherwise noncontributory  PAST MEDICAL HISTORY: Past Medical History  Diagnosis Date  . Arthritis   . Anxiety   . Hyperlipidemia     takes Atorvastatin 3 times a week  . Shingles   . Anemia   . Allergy     Nasonex daily as needed  . Wears glasses   . PONV (postoperative nausea and vomiting)   . 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 S/P radiation therapy 07/18/2013-08/28/2013    1) Left Breast / 50  Gy in 25 fractions/ 2) Left Breast Boost / 10 Gy in 5 fractions  . Hypertension     takes Cardura nightly and Verapamil daily  . History of shingles   . Sinus drainage     put on Levaquin yesterday if no better in 3 days will start prednisone  . Headache(784.0)     rare  .  Weakness     tingling and numbness both hands and left leg  . Joint pain   . Chronic back pain     cyst sitting on L4-5;slipped disc  . GERD (gastroesophageal reflux disease)     takes Omeprazole daily  . History of gastric ulcer at age 79  . Diverticulosis   . Depression     takes Cymbalta daily  . Insomnia     takes Restoril nightly    PAST SURGICAL HISTORY: Past Surgical History  Procedure Laterality Date  . Cataract extraction bilateral w/ anterior vitrectomy Bilateral   . Abdominal hysterectomy    . Cholecystectomy    . Knee arthroscopy Bilateral   . Tonsillectomy    . Colonoscopy    . Upper gi endoscopy    . Breast surgery      lt lump-neg  . Dupuytren / palmar fasciotomy Bilateral 2007    x 3 to left and once to the right   . Tubal ligation    . Foot surgery Right   . Epidural injections      x 3    FAMILY HISTORY Family History  Problem Relation Age of Onset  . Colon polyps Neg Hx   . Esophageal cancer Neg Hx   . Rectal cancer Neg Hx   . Stomach cancer Neg Hx   . Heart attack Mother   . Stroke Mother   . COPD Father   . Hypertension Father    the patient's father died at the age of 90 from uremia. The patient's mother died at the age of 18 from a heart attack. The patient has one brother, 5 sisters. One of her sisters was diagnosed with breast cancer in her 80s. There is no history of ovarian cancer in the family  GYNECOLOGIC HISTORY:  No LMP recorded. Patient has had a hysterectomy. Menarche age 43, first live birth age 42. The patient is GX P1. She had a hysterectomy with bilateral salpingo-oophorectomy in her 68s and receive hormone replacement for approximately 30 years after that.  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 Her husband died a few years ago with severe respiratory complications. He had a tracheostomy. She was his primary caregiver. She now lives alone with her femal Educational psychologist. Her son  Harrell Gave lives in [place]. He does O. designed for [employer]. The patient has one biological and 3 stepgrandchildren. She is a [employer]   ADVANCED DIRECTIVES: Not in place; at the 01/13/2014 visit the patient received the appropriate documents to declare healthcare power of attorney.   HEALTH MAINTENANCE: History  Substance Use Topics  . Smoking status: Never Smoker   . Smokeless tobacco: Never Used  . Alcohol Use: Yes     Comment: wine occasionally-rarely     Colonoscopy:  PAP:  Bone density: 01/07/2014, normal  Lipid panel:  Allergies  Allergen Reactions  . Requip [Ropinirole Hcl] Shortness Of Breath and Nausea And Vomiting  . Codeine Other (See Comments)    fatigue  . Minocycline Hives  . Morphine Sulfate Other (See  Comments)    Palpitations  . Tetracyclines & Related Hives    Pt doesn't remember a reaction  . Zestril [Lisinopril] Cough  . Zetia [Ezetimibe] Other (See Comments)    Muscle and joint pain  . Amoxicillin Rash    Pt doesn't remember a reaction  . Sulfa Antibiotics Other (See Comments)    Headache (pt states that a blood pressure medicine caused a severe headache but doesn't remember a reaction to sulfa)    Current Outpatient Prescriptions  Medication Sig Dispense Refill  . anastrozole (ARIMIDEX) 1 MG tablet Take 1 tablet (1 mg total) by mouth daily.  30 tablet  3  . atorvastatin (LIPITOR) 80 MG tablet TAKE 1 TABLET EVERY DAY  90 tablet  0  . Cholecalciferol (VITAMIN D) 2000 UNITS tablet Take 4,000 Units by mouth daily.      Marland Kitchen doxazosin (CARDURA) 8 MG tablet Take 4 mg by mouth at bedtime.      . DULoxetine (CYMBALTA) 30 MG capsule Take 90 mg by mouth daily.       Marland Kitchen gabapentin (NEURONTIN) 800 MG tablet Take 800 mg by mouth 3 (three) times daily.       . mometasone (NASONEX) 50 MCG/ACT nasal spray Place 1 spray into both nostrils 2 (two) times daily as needed (sinusitis).      Marland Kitchen omeprazole (PRILOSEC) 40 MG capsule Take 40 mg by mouth  daily.      Marland Kitchen oxyCODONE-acetaminophen (ENDOCET) 5-325 MG per tablet Take 1 tablet by mouth every 6 (six) hours as needed for severe pain.      Marland Kitchen temazepam (RESTORIL) 30 MG capsule Take 30 mg by mouth at bedtime.      . traMADol (ULTRAM) 50 MG tablet Take 50 mg by mouth 4 (four) times daily as needed for moderate pain (headaches).       . verapamil (CALAN-SR) 240 MG CR tablet TAKE 1 TABLET BY MOUTH EVERY DAY WITH FOOD  90 tablet  1   No current facility-administered medications for this visit.    OBJECTIVE: Middle-aged white woman in no acute distress Filed Vitals:   01/13/14 1545  BP: 139/62  Pulse: 79  Temp: 98.5 F (36.9 C)  Resp: 18     Body mass index is 36.27 kg/(m^2).    ECOG FS:2 - Symptomatic, <50% confined to bed  Ocular: Sclerae unicteric, pupils round and equal Ear-nose-throat: Oropharynx clear and moist Lymphatic: No cervical or supraclavicular adenopathy Lungs no rales or rhonchi, good excursion bilaterally Heart regular rate and rhythm, no murmur appreciated Abd soft, nontender, positive bowel sounds MSK no focal spinal tenderness to mild palpation, no upper extremity edema Neuro: non-focal, well-oriented, appropriate affect Breasts: The right breast is unremarkable. The left breast is status post lumpectomy and radiation. There is no evidence of local recurrence. The left axilla is benign.   LAB RESULTS:  CMP     Component Value Date/Time   NA 140 01/13/2014 1505   NA 137 12/18/2013 1438   K 3.5 01/13/2014 1505   K 4.5 12/18/2013 1438   CL 101 12/18/2013 1438   CO2 27 01/13/2014 1505   CO2 25 12/18/2013 1438   GLUCOSE 99 01/13/2014 1505   GLUCOSE 100* 12/18/2013 1438   BUN 14.4 01/13/2014 1505   BUN 23 12/18/2013 1438   CREATININE 0.8 01/13/2014 1505   CREATININE 0.88 12/18/2013 1438   CREATININE 0.68 10/20/2013 0915   CALCIUM 9.4 01/13/2014 1505   CALCIUM 10.0 12/18/2013 1438   PROT 7.0 01/13/2014  1505   PROT 7.7 12/18/2013 1438   ALBUMIN 3.5 01/13/2014 1505   ALBUMIN 4.4  12/18/2013 1438   AST 14 01/13/2014 1505   AST 14 12/18/2013 1438   ALT 13 01/13/2014 1505   ALT 10 12/18/2013 1438   ALKPHOS 102 01/13/2014 1505   ALKPHOS 99 12/18/2013 1438   BILITOT 0.22 01/13/2014 1505   BILITOT 0.3 12/18/2013 1438   GFRNONAA 68 12/18/2013 1438   GFRNONAA 89* 10/20/2013 0915   GFRAA 79 12/18/2013 1438   GFRAA >90 10/20/2013 0915    I No results found for this basename: SPEP, UPEP,  kappa and lambda light chains    Lab Results  Component Value Date   WBC 6.6 01/13/2014   NEUTROABS 4.5 01/13/2014   HGB 9.9* 01/13/2014   HCT 30.7* 01/13/2014   MCV 87.5 01/13/2014   PLT 256 01/13/2014      Chemistry      Component Value Date/Time   NA 140 01/13/2014 1505   NA 137 12/18/2013 1438   K 3.5 01/13/2014 1505   K 4.5 12/18/2013 1438   CL 101 12/18/2013 1438   CO2 27 01/13/2014 1505   CO2 25 12/18/2013 1438   BUN 14.4 01/13/2014 1505   BUN 23 12/18/2013 1438   CREATININE 0.8 01/13/2014 1505   CREATININE 0.88 12/18/2013 1438   CREATININE 0.68 10/20/2013 0915      Component Value Date/Time   CALCIUM 9.4 01/13/2014 1505   CALCIUM 10.0 12/18/2013 1438   ALKPHOS 102 01/13/2014 1505   ALKPHOS 99 12/18/2013 1438   AST 14 01/13/2014 1505   AST 14 12/18/2013 1438   ALT 13 01/13/2014 1505   ALT 10 12/18/2013 1438   BILITOT 0.22 01/13/2014 1505   BILITOT 0.3 12/18/2013 1438       Lab Results  Component Value Date   LABCA2 30 05/31/2013    No components found with this basename: [id number]    No results found for this basename: INR,  in the last 168 hours  Urinalysis    Component Value Date/Time   COLORURINE YELLOW 10/18/2013 1300   APPEARANCEUR HAZY* 10/18/2013 1300   LABSPEC 1.021 10/18/2013 1300   PHURINE 5.5 10/18/2013 1300   GLUCOSEU NEGATIVE 10/18/2013 1300   HGBUR NEGATIVE 10/18/2013 1300   BILIRUBINUR NEGATIVE 10/18/2013 1300   KETONESUR NEGATIVE 10/18/2013 1300   PROTEINUR NEGATIVE 10/18/2013 1300   UROBILINOGEN 0.2 10/18/2013 1300   NITRITE NEGATIVE 10/18/2013 1300   LEUKOCYTESUR MODERATE*  10/18/2013 1300    STUDIES: Dg Bone Density  01/07/2014   EXAM: DG DXA BONE DENSITY STUDY  The Bone Mineral Densitometry hard-copy report (which includes all data, graphical display, and FRAX results when applicable) has been sent directly to the ordering physician.  This report can also be obtained electronically by viewing images for this exam through the performing facility's EMR, or by logging directly into [employer].   Electronically Signed   By: Lajean Manes M.D.   On: 01/07/2014 13:59    ASSESSMENT: 68 y.o. [place] woman  (1) status post left lumpectomy and sentinel lymph node sampling 06/06/2013 for a pT1c pN0, stage IA invasive lobular carcinoma, grade 1, estrogen receptor and progesterone receptor strongly positive, with no HER-2 amplification, and an MIB-1 of 14%.  (2) Oncotype DX score of 11 predicted a 10 year risk of outside the breast recurrence of 8% of the patient's only local treatment was tamoxifen for 5 years. It also predicted no benefit from chemotherapy.  (3) adjuvant radiation  to the left breast completed 08/28/2013.  (4) started anastrozole June 20 15; bone density scan September 2015 was normal (T score positive)  (5) status post back injury requiring posterior lumbar fusion at the L4-5 level  (6) status post remote hysterectomy with bilateral salpingo-oophorectomy  PLAN: We spent the better part of today's hour-long appointment discussing the biology of breast cancer in general, and the specifics of the patient's tumor in particular. She understands her tumor was nonaggressive looking and slow-growing. She has had excellent local treatment. She started her systemic treatment in June and so far she is tolerating it well.  We went over the Oncotype score and she understands it is predicated on her taking tamoxifen for 5 years. However she will be on anastrozole instead for 5 years. Because anastrozole is more effective than tamoxifen in had direct comparisons,  she can expect a 95% chance of this cancer not recurring within the next 10 years assuming of course she does complete her 5 years of anastrozole, which is the plan.  We discussed the possible toxicities, side effects and complications of anastrozole particularly including bone density issues, and a good news there is that we are starting with a normal bone density. We will repeat that in 2 years. In the meantime I have encouraged her to continue walking and to start vitamin D and calcium supplementation.  If she sees her surgeon in 3 months then she can see me 6 months from now and we can continue to "tag team her" in that fashion for the first 2 years of followup, after which we can broaden the followup interval.  Finally we also discussed advanced directives. Because she is a widow she does not have an asthmatic healthcare power of attorney. She is intending to name her son. I gave her the appropriate documents to complete today.  The patient has a good understanding of the overall plan. She agrees with it. She knows the goal of treatment in her case is cure. She will call with any problems that may develop before her next visit here.  Chauncey Cruel, MD   01/13/2014 4:17 PM

## 2014-01-13 NOTE — Telephone Encounter (Signed)
gv pt appt schedule for march/april 2016.

## 2014-01-14 ENCOUNTER — Telehealth: Payer: Self-pay | Admitting: *Deleted

## 2014-01-14 NOTE — Telephone Encounter (Signed)
Patient aware of normal BMD result and advised to continue Calcium 500-600 mg daily,Vitamin D and exercise per Dr Melford Aase.

## 2014-01-16 DIAGNOSIS — M545 Low back pain: Secondary | ICD-10-CM | POA: Diagnosis not present

## 2014-01-22 DIAGNOSIS — M545 Low back pain: Secondary | ICD-10-CM | POA: Diagnosis not present

## 2014-01-27 DIAGNOSIS — M545 Low back pain: Secondary | ICD-10-CM | POA: Diagnosis not present

## 2014-01-29 ENCOUNTER — Ambulatory Visit (INDEPENDENT_AMBULATORY_CARE_PROVIDER_SITE_OTHER): Payer: Medicare Other | Admitting: *Deleted

## 2014-01-29 DIAGNOSIS — Z23 Encounter for immunization: Secondary | ICD-10-CM

## 2014-01-30 ENCOUNTER — Other Ambulatory Visit: Payer: Self-pay | Admitting: Physician Assistant

## 2014-01-31 ENCOUNTER — Other Ambulatory Visit: Payer: Self-pay | Admitting: Physician Assistant

## 2014-02-03 DIAGNOSIS — M545 Low back pain: Secondary | ICD-10-CM | POA: Diagnosis not present

## 2014-02-04 DIAGNOSIS — M1711 Unilateral primary osteoarthritis, right knee: Secondary | ICD-10-CM | POA: Diagnosis not present

## 2014-02-04 DIAGNOSIS — M1712 Unilateral primary osteoarthritis, left knee: Secondary | ICD-10-CM | POA: Diagnosis not present

## 2014-02-05 DIAGNOSIS — M545 Low back pain: Secondary | ICD-10-CM | POA: Diagnosis not present

## 2014-02-07 DIAGNOSIS — M545 Low back pain: Secondary | ICD-10-CM | POA: Diagnosis not present

## 2014-02-10 DIAGNOSIS — M545 Low back pain: Secondary | ICD-10-CM | POA: Diagnosis not present

## 2014-02-11 DIAGNOSIS — M170 Bilateral primary osteoarthritis of knee: Secondary | ICD-10-CM | POA: Diagnosis not present

## 2014-02-12 DIAGNOSIS — M545 Low back pain: Secondary | ICD-10-CM | POA: Diagnosis not present

## 2014-02-15 ENCOUNTER — Other Ambulatory Visit: Payer: Self-pay | Admitting: Physician Assistant

## 2014-02-17 DIAGNOSIS — M545 Low back pain: Secondary | ICD-10-CM | POA: Diagnosis not present

## 2014-02-18 DIAGNOSIS — M170 Bilateral primary osteoarthritis of knee: Secondary | ICD-10-CM | POA: Diagnosis not present

## 2014-02-19 DIAGNOSIS — M545 Low back pain: Secondary | ICD-10-CM | POA: Diagnosis not present

## 2014-02-24 DIAGNOSIS — M545 Low back pain: Secondary | ICD-10-CM | POA: Diagnosis not present

## 2014-02-25 DIAGNOSIS — M170 Bilateral primary osteoarthritis of knee: Secondary | ICD-10-CM | POA: Diagnosis not present

## 2014-02-27 ENCOUNTER — Other Ambulatory Visit: Payer: Self-pay

## 2014-02-27 MED ORDER — ATORVASTATIN CALCIUM 80 MG PO TABS: 80.0000 mg | ORAL_TABLET | Freq: Every day | ORAL | Status: DC

## 2014-03-04 DIAGNOSIS — M170 Bilateral primary osteoarthritis of knee: Secondary | ICD-10-CM | POA: Diagnosis not present

## 2014-03-21 DIAGNOSIS — Z981 Arthrodesis status: Secondary | ICD-10-CM | POA: Diagnosis not present

## 2014-03-24 ENCOUNTER — Encounter: Payer: Self-pay | Admitting: Physician Assistant

## 2014-03-24 ENCOUNTER — Other Ambulatory Visit: Payer: Self-pay | Admitting: Physician Assistant

## 2014-03-24 ENCOUNTER — Ambulatory Visit (INDEPENDENT_AMBULATORY_CARE_PROVIDER_SITE_OTHER): Payer: Medicare Other | Admitting: Physician Assistant

## 2014-03-24 VITALS — BP 140/78 | HR 84 | Temp 97.7°F | Resp 16 | Wt 223.0 lb

## 2014-03-24 DIAGNOSIS — K858 Other acute pancreatitis without necrosis or infection: Secondary | ICD-10-CM

## 2014-03-24 DIAGNOSIS — I471 Supraventricular tachycardia: Secondary | ICD-10-CM | POA: Diagnosis not present

## 2014-03-24 DIAGNOSIS — Z79899 Other long term (current) drug therapy: Secondary | ICD-10-CM

## 2014-03-24 DIAGNOSIS — I10 Essential (primary) hypertension: Secondary | ICD-10-CM | POA: Diagnosis not present

## 2014-03-24 DIAGNOSIS — R1013 Epigastric pain: Secondary | ICD-10-CM | POA: Diagnosis not present

## 2014-03-24 DIAGNOSIS — K219 Gastro-esophageal reflux disease without esophagitis: Secondary | ICD-10-CM

## 2014-03-24 DIAGNOSIS — E782 Mixed hyperlipidemia: Secondary | ICD-10-CM

## 2014-03-24 DIAGNOSIS — E538 Deficiency of other specified B group vitamins: Secondary | ICD-10-CM | POA: Diagnosis not present

## 2014-03-24 DIAGNOSIS — E559 Vitamin D deficiency, unspecified: Secondary | ICD-10-CM

## 2014-03-24 DIAGNOSIS — E669 Obesity, unspecified: Secondary | ICD-10-CM

## 2014-03-24 DIAGNOSIS — R7303 Prediabetes: Secondary | ICD-10-CM

## 2014-03-24 DIAGNOSIS — D649 Anemia, unspecified: Secondary | ICD-10-CM | POA: Diagnosis not present

## 2014-03-24 DIAGNOSIS — R7309 Other abnormal glucose: Secondary | ICD-10-CM | POA: Diagnosis not present

## 2014-03-24 DIAGNOSIS — E611 Iron deficiency: Secondary | ICD-10-CM | POA: Diagnosis not present

## 2014-03-24 LAB — CBC WITH DIFFERENTIAL/PLATELET
Basophils Absolute: 0.1 K/uL (ref 0.0–0.1)
Basophils Relative: 1 % (ref 0–1)
Eosinophils Absolute: 0.1 K/uL (ref 0.0–0.7)
Eosinophils Relative: 2 % (ref 0–5)
HCT: 29.3 % — ABNORMAL LOW (ref 36.0–46.0)
Hemoglobin: 10 g/dL — ABNORMAL LOW (ref 12.0–15.0)
Lymphocytes Relative: 23 % (ref 12–46)
Lymphs Abs: 1.6 K/uL (ref 0.7–4.0)
MCH: 28.3 pg (ref 26.0–34.0)
MCHC: 34.1 g/dL (ref 30.0–36.0)
MCV: 83 fL (ref 78.0–100.0)
MPV: 10.4 fL (ref 9.4–12.4)
Monocytes Absolute: 0.7 K/uL (ref 0.1–1.0)
Monocytes Relative: 10 % (ref 3–12)
Neutro Abs: 4.5 K/uL (ref 1.7–7.7)
Neutrophils Relative %: 64 % (ref 43–77)
Platelets: 284 K/uL (ref 150–400)
RBC: 3.53 MIL/uL — ABNORMAL LOW (ref 3.87–5.11)
RDW: 14.9 % (ref 11.5–15.5)
WBC: 7 K/uL (ref 4.0–10.5)

## 2014-03-24 MED ORDER — MELOXICAM 15 MG PO TABS: ORAL_TABLET | ORAL | Status: DC

## 2014-03-24 NOTE — Patient Instructions (Signed)
Increase prilosec to twice a day for 7-14 days  Abdominal Pain Many things can cause abdominal pain. Usually, abdominal pain is not caused by a disease and will improve without treatment. It can often be observed and treated at home. Your health care provider will do a physical exam and possibly order blood tests and X-rays to help determine the seriousness of your pain. However, in many cases, more time must pass before a clear cause of the pain can be found. Before that point, your health care provider may not know if you need more testing or further treatment. HOME CARE INSTRUCTIONS  Monitor your abdominal pain for any changes. The following actions may help to alleviate any discomfort you are experiencing:  Only take over-the-counter or prescription medicines as directed by your health care provider.  Do not take laxatives unless directed to do so by your health care provider.  Try a clear liquid diet (broth, tea, or water) as directed by your health care provider. Slowly move to a bland diet as tolerated. SEEK MEDICAL CARE IF:  You have unexplained abdominal pain.  You have abdominal pain associated with nausea or diarrhea.  You have pain when you urinate or have a bowel movement.  You experience abdominal pain that wakes you in the night.  You have abdominal pain that is worsened or improved by eating food.  You have abdominal pain that is worsened with eating fatty foods.  You have a fever. SEEK IMMEDIATE MEDICAL CARE IF:   Your pain does not go away within 2 hours.  You keep throwing up (vomiting).  Your pain is felt only in portions of the abdomen, such as the right side or the left lower portion of the abdomen.  You pass bloody or black tarry stools. MAKE SURE YOU:  Understand these instructions.   Will watch your condition.   Will get help right away if you are not doing well or get worse.  Document Released: 01/05/2005 Document Revised: 04/02/2013 Document  Reviewed: 12/05/2012 [facility] Patient Information 2015 [place], [place]. This information is not intended to replace advice given to you by your health care provider. Make sure you discuss any questions you have with your health care provider.

## 2014-03-24 NOTE — Progress Notes (Signed)
Assessment and Plan:  Hypertension: Continue medication, monitor blood pressure at home. Continue DASH diet.  Reminder to go to the ER if any CP, SOB, nausea, dizziness, severe HA, changes vision/speech, left arm numbness and tingling, and jaw pain. Cholesterol: Continue diet and exercise. Check cholesterol.  Pre-diabetes-Continue diet and exercise. Check A1C Vitamin D Def- check level and continue medications.  Obesity with co morbidities- long discussion about weight loss, diet, and exercise Epigastric pain:? infection, ulcer  pancreatitis-  check labs, bland diet, small portions, increase H20, increase PPI to BID for 1-2 weeks, stop NSAIDS if pain continues will return for abdominal U/S.  Patient advised to go to the ER if the symptoms increase or worsen.     Continue diet and meds as discussed. Further disposition pending results of labs.  HPI 68 y.o. female  presents for 3 month follow up with hypertension, hyperlipidemia, prediabetes and vitamin D. Her blood pressure has been controlled at home, she is on cardura 8mg , verapamil 240 at night, today their BP is BP: 140/78 mmHg She does not workout. She denies chest pain, shortness of breath, dizziness.  She is on cholesterol medication, lipitor 80mg  1/2 pill every day and denies myalgias. Her cholesterol is not at goal. The cholesterol last visit was:   Lab Results  Component Value Date   CHOL 221* 12/18/2013   HDL 56 12/18/2013   LDLCALC 139* 12/18/2013   TRIG 130 12/18/2013   CHOLHDL 3.9 12/18/2013   She has been working on diet and exercise for prediabetes, and denies paresthesia of the feet, polydipsia, polyuria and visual disturbances. Last A1C in the office was:  Lab Results  Component Value Date   HGBA1C 6.1* 12/18/2013   Patient is not on Vitamin D supplement, since she started calcitrate with Vit D, has not been taking it for 3 months.  Lab Results  Component Value Date   VD25OH 60 12/18/2013     Follows with Dr.  [name] for lower back surgery, she states it has improved but still has some muscular pain, no pain down her legs.  Follows with Dr. Griffith Citron for left breast cancer, had lumpectomy and radiation.  She has had periumbilical pain for 2 weeks, sharp pain, intermittent, worse with bending over/palpation, denies diarrhea, constipation, nausea. Not worse with eating/food. She has been taking meloxicam 15mg  daily since the surgery BMI is Body mass index is 36.53 kg/(m^2)., she is working on diet and exercise. Wt Readings from Last 3 Encounters:  03/24/14 223 lb (101.152 kg)  01/13/14 221 lb 6.4 oz (100.426 kg)  12/18/13 221 lb (100.245 kg)    Current Medications:  Current Outpatient Prescriptions on File Prior to Visit  Medication Sig Dispense Refill  . anastrozole (ARIMIDEX) 1 MG tablet Take 1 tablet (1 mg total) by mouth daily. 90 tablet 3  . atorvastatin (LIPITOR) 80 MG tablet Take 1 tablet (80 mg total) by mouth daily. 90 tablet 3  . Calcium Carbonate-Vitamin D (CALTRATE 600+D) 600-400 MG-UNIT per chew tablet Chew 1 tablet by mouth daily. 180 tablet 4  . Cholecalciferol (VITAMIN D) 2000 UNITS tablet Take 4,000 Units by mouth daily.    Marland Kitchen doxazosin (CARDURA) 8 MG tablet Take 4 mg by mouth at bedtime.    . DULoxetine (CYMBALTA) 30 MG capsule TAKE 1 CAPSULE BY MOUTH 3 TIMES DAILY 270 capsule 3  . gabapentin (NEURONTIN) 800 MG tablet Take 800 mg by mouth 3 (three) times daily.     . mometasone (NASONEX) 50 MCG/ACT nasal spray  Place 1 spray into both nostrils 2 (two) times daily as needed (sinusitis).    Marland Kitchen omeprazole (PRILOSEC) 20 MG capsule TAKE ONE CAPSULE BY MOUTH EVERY DAY 90 capsule 99  . omeprazole (PRILOSEC) 40 MG capsule TAKE 1 CAPSULE BY MOUTH ONCE DAILY 90 capsule 2  . oxyCODONE-acetaminophen (ENDOCET) 5-325 MG per tablet Take 1 tablet by mouth every 6 (six) hours as needed for severe pain.    Marland Kitchen temazepam (RESTORIL) 30 MG capsule Take 30 mg by mouth at bedtime.    . traMADol (ULTRAM) 50 MG  tablet Take 50 mg by mouth 4 (four) times daily as needed for moderate pain (headaches).     . verapamil (CALAN-SR) 240 MG CR tablet TAKE 1 TABLET BY MOUTH EVERY DAY WITH FOOD 90 tablet 1   No current facility-administered medications on file prior to visit.   Medical History:  Past Medical History  Diagnosis Date  . Arthritis   . Anxiety   . Hyperlipidemia     takes Atorvastatin 3 times a week  . Shingles   . Anemia   . Allergy     Nasonex daily as needed  . Wears glasses   . PONV (postoperative nausea and vomiting)   . 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 S/P radiation therapy 07/18/2013-08/28/2013    1) Left Breast / 50 Gy in 25 fractions/ 2) Left Breast Boost / 10 Gy in 5 fractions  . Hypertension     takes Cardura nightly and Verapamil daily  . History of shingles   . Sinus drainage     put on Levaquin yesterday if no better in 3 days will start prednisone  . Headache(784.0)     rare  . Weakness     tingling and numbness both hands and left leg  . Joint pain   . Chronic back pain     cyst sitting on L4-5;slipped disc  . GERD (gastroesophageal reflux disease)     takes Omeprazole daily  . History of gastric ulcer at age 35  . Diverticulosis   . Depression     takes Cymbalta daily  . Insomnia     takes Restoril nightly   Allergies:  Allergies  Allergen Reactions  . Requip [Ropinirole Hcl] Shortness Of Breath and Nausea And Vomiting  . Codeine Other (See Comments)    fatigue  . Minocycline Hives  . Morphine Sulfate Other (See Comments)    Palpitations  . Tetracyclines & Related Hives    Pt doesn't remember a reaction  . Zestril [Lisinopril] Cough  . Zetia [Ezetimibe] Other (See Comments)    Muscle and joint pain  . Amoxicillin Rash    Pt doesn't remember a reaction  . Sulfa Antibiotics Other (See Comments)    Headache (pt states that a blood pressure medicine caused a severe headache but doesn't remember a  reaction to sulfa)     Review of Systems:  Review of Systems  Constitutional: Negative.   HENT: Negative.   Respiratory: Negative.   Cardiovascular: Negative.   Gastrointestinal: Positive for abdominal pain. Negative for heartburn, nausea, vomiting, diarrhea, constipation, blood in stool and melena.  Genitourinary: Negative.  Negative for dysuria, urgency, frequency, hematuria and flank pain.  Musculoskeletal: Positive for back pain. Negative for myalgias, joint pain, falls and neck pain.  Skin: Negative.   Neurological: Negative.   Psychiatric/Behavioral: Negative.     Family history- Review and unchanged Social history- Review and  unchanged Physical Exam: BP 140/78 mmHg  Pulse 84  Temp(Src) 97.7 F (36.5 C)  Resp 16  Wt 223 lb (101.152 kg) Wt Readings from Last 3 Encounters:  03/24/14 223 lb (101.152 kg)  01/13/14 221 lb 6.4 oz (100.426 kg)  12/18/13 221 lb (100.245 kg)   General Appearance: Well nourished, in no apparent distress. Eyes: PERRLA, EOMs, conjunctiva no swelling or erythema Sinuses: No Frontal/maxillary tenderness ENT/Mouth: Ext aud canals clear, TMs without erythema, bulging. No erythema, swelling, or exudate on post pharynx.  Tonsils not swollen or erythematous. Hearing normal.  Neck: Supple, thyroid normal.  Respiratory: Respiratory effort normal, BS equal bilaterally without rales, rhonchi, wheezing or stridor.  Cardio: RRR with 2/6 soft holosystolic murmur. Brisk peripheral pulses without edema.  Abdomen: Soft, + BS, obese, + epigastric tenderness and suprapubic pain, no guarding, rebound, hernias, masses. + healing deep vertical scar from naval down lower AB. Lymphatics: Non tender without lymphadenopathy.  Musculoskeletal: Full ROM, 5/5 strength, normal gait.  Skin: Warm, dry without rashes, lesions, ecchymosis.  Neuro: Cranial nerves intact. Normal muscle tone, no cerebellar symptoms. Sensation intact.  Psych: Awake and oriented X 3, normal affect,  Insight and Judgment appropriate.    Vicie Mutters, PA-C 2:35 PM [facility]

## 2014-03-25 LAB — BASIC METABOLIC PANEL WITH GFR
BUN: 19 mg/dL (ref 6–23)
CALCIUM: 9.5 mg/dL (ref 8.4–10.5)
CHLORIDE: 103 meq/L (ref 96–112)
CO2: 24 mEq/L (ref 19–32)
Creat: 0.72 mg/dL (ref 0.50–1.10)
GFR, EST NON AFRICAN AMERICAN: 87 mL/min
GFR, Est African American: >89 mL/min
Glucose, Bld: 75 mg/dL (ref 70–99)
Potassium: 4.2 mEq/L (ref 3.5–5.3)
Sodium: 138 mEq/L (ref 135–145)

## 2014-03-25 LAB — LIPID PANEL
CHOLESTEROL: 159 mg/dL (ref 0–200)
HDL: 48 mg/dL (ref >39)
LDL Cholesterol: 82 mg/dL (ref 0–99)
Total CHOL/HDL Ratio: 3.3 Ratio
Triglycerides: 146 mg/dL (ref <150)
VLDL: 29 mg/dL (ref 0–40)

## 2014-03-25 LAB — HEPATIC FUNCTION PANEL
ALBUMIN: 4 g/dL (ref 3.5–5.2)
ALT: 10 U/L (ref 0–35)
AST: 15 U/L (ref 0–37)
Alkaline Phosphatase: 106 U/L (ref 39–117)
Bilirubin, Direct: 0.1 mg/dL (ref 0.0–0.3)
Indirect Bilirubin: 0.2 mg/dL (ref 0.2–1.2)
Total Bilirubin: 0.3 mg/dL (ref 0.2–1.2)
Total Protein: 6.8 g/dL (ref 6.0–8.3)

## 2014-03-25 LAB — IRON AND TIBC
%SAT: 8 % — AB (ref 20–55)
Iron: 30 ug/dL — ABNORMAL LOW (ref 42–145)
TIBC: 392 ug/dL (ref 250–470)
UIBC: 362 ug/dL (ref 125–400)

## 2014-03-25 LAB — TSH: TSH: 2.318 u[IU]/mL (ref 0.350–4.500)

## 2014-03-25 LAB — MAGNESIUM: Magnesium: 1.9 mg/dL (ref 1.5–2.5)

## 2014-03-25 LAB — FERRITIN: FERRITIN: 25 ng/mL (ref 10–291)

## 2014-03-25 LAB — VITAMIN B12: Vitamin B-12: 357 pg/mL (ref 211–911)

## 2014-03-25 LAB — HEMOGLOBIN A1C
HEMOGLOBIN A1C: 6.3 % — AB (ref <5.7)
Mean Plasma Glucose: 134 mg/dL — ABNORMAL HIGH (ref <117)

## 2014-03-25 LAB — AMYLASE: Amylase: 25 U/L (ref 0–105)

## 2014-03-25 LAB — LIPASE: LIPASE: 34 U/L (ref 0–75)

## 2014-03-31 DIAGNOSIS — L600 Ingrowing nail: Secondary | ICD-10-CM | POA: Diagnosis not present

## 2014-04-01 DIAGNOSIS — C50912 Malignant neoplasm of unspecified site of left female breast: Secondary | ICD-10-CM | POA: Diagnosis not present

## 2014-04-02 ENCOUNTER — Other Ambulatory Visit: Payer: Self-pay | Admitting: Internal Medicine

## 2014-04-28 ENCOUNTER — Other Ambulatory Visit: Payer: Self-pay | Admitting: Physician Assistant

## 2014-04-28 DIAGNOSIS — G4700 Insomnia, unspecified: Secondary | ICD-10-CM

## 2014-05-01 ENCOUNTER — Other Ambulatory Visit: Payer: Self-pay | Admitting: Oncology

## 2014-05-01 DIAGNOSIS — Z853 Personal history of malignant neoplasm of breast: Secondary | ICD-10-CM

## 2014-05-15 ENCOUNTER — Other Ambulatory Visit: Payer: Self-pay

## 2014-05-15 ENCOUNTER — Other Ambulatory Visit: Payer: Self-pay | Admitting: Oncology

## 2014-05-15 DIAGNOSIS — Z853 Personal history of malignant neoplasm of breast: Secondary | ICD-10-CM

## 2014-05-20 ENCOUNTER — Ambulatory Visit
Admission: RE | Admit: 2014-05-20 | Discharge: 2014-05-20 | Disposition: A | Discharge status: Home / Self Care | Payer: Medicare Other | Source: Ambulatory Visit | Attending: Oncology | Admitting: Oncology

## 2014-05-20 DIAGNOSIS — Z853 Personal history of malignant neoplasm of breast: Secondary | ICD-10-CM

## 2014-06-06 ENCOUNTER — Other Ambulatory Visit: Payer: Self-pay | Admitting: Nurse Practitioner

## 2014-06-13 ENCOUNTER — Telehealth: Payer: Self-pay | Admitting: Oncology

## 2014-06-13 NOTE — Telephone Encounter (Signed)
per GM to move due to on call-cld pt to adv of updated time appt same day

## 2014-06-16 ENCOUNTER — Encounter: Payer: Self-pay | Admitting: Physician Assistant

## 2014-06-16 ENCOUNTER — Ambulatory Visit (INDEPENDENT_AMBULATORY_CARE_PROVIDER_SITE_OTHER): Payer: Medicare Other | Admitting: Physician Assistant

## 2014-06-16 VITALS — BP 138/80 | HR 84 | Temp 97.7°F | Resp 16 | Ht 65.5 in | Wt 226.0 lb

## 2014-06-16 DIAGNOSIS — M51369 Other intervertebral disc degeneration, lumbar region without mention of lumbar back pain or lower extremity pain: Secondary | ICD-10-CM

## 2014-06-16 DIAGNOSIS — Z0001 Encounter for general adult medical examination with abnormal findings: Secondary | ICD-10-CM | POA: Diagnosis not present

## 2014-06-16 DIAGNOSIS — J0100 Acute maxillary sinusitis, unspecified: Secondary | ICD-10-CM

## 2014-06-16 DIAGNOSIS — R6889 Other general symptoms and signs: Secondary | ICD-10-CM | POA: Diagnosis not present

## 2014-06-16 DIAGNOSIS — B351 Tinea unguium: Secondary | ICD-10-CM

## 2014-06-16 DIAGNOSIS — I10 Essential (primary) hypertension: Secondary | ICD-10-CM

## 2014-06-16 DIAGNOSIS — E669 Obesity, unspecified: Secondary | ICD-10-CM

## 2014-06-16 DIAGNOSIS — J309 Allergic rhinitis, unspecified: Secondary | ICD-10-CM

## 2014-06-16 DIAGNOSIS — K219 Gastro-esophageal reflux disease without esophagitis: Secondary | ICD-10-CM

## 2014-06-16 DIAGNOSIS — R7303 Prediabetes: Secondary | ICD-10-CM

## 2014-06-16 DIAGNOSIS — M5136 Other intervertebral disc degeneration, lumbar region: Secondary | ICD-10-CM | POA: Insufficient documentation

## 2014-06-16 DIAGNOSIS — R7309 Other abnormal glucose: Secondary | ICD-10-CM | POA: Diagnosis not present

## 2014-06-16 DIAGNOSIS — Z8719 Personal history of other diseases of the digestive system: Secondary | ICD-10-CM

## 2014-06-16 DIAGNOSIS — Z79899 Other long term (current) drug therapy: Secondary | ICD-10-CM

## 2014-06-16 DIAGNOSIS — I471 Supraventricular tachycardia, unspecified: Secondary | ICD-10-CM

## 2014-06-16 DIAGNOSIS — E782 Mixed hyperlipidemia: Secondary | ICD-10-CM | POA: Diagnosis not present

## 2014-06-16 DIAGNOSIS — Z23 Encounter for immunization: Secondary | ICD-10-CM

## 2014-06-16 DIAGNOSIS — E559 Vitamin D deficiency, unspecified: Secondary | ICD-10-CM

## 2014-06-16 DIAGNOSIS — F341 Dysthymic disorder: Secondary | ICD-10-CM

## 2014-06-16 DIAGNOSIS — M5416 Radiculopathy, lumbar region: Secondary | ICD-10-CM

## 2014-06-16 DIAGNOSIS — Z0000 Encounter for general adult medical examination without abnormal findings: Secondary | ICD-10-CM

## 2014-06-16 DIAGNOSIS — G4700 Insomnia, unspecified: Secondary | ICD-10-CM

## 2014-06-16 DIAGNOSIS — Z789 Other specified health status: Secondary | ICD-10-CM

## 2014-06-16 DIAGNOSIS — C50312 Malignant neoplasm of lower-inner quadrant of left female breast: Secondary | ICD-10-CM

## 2014-06-16 DIAGNOSIS — Z8679 Personal history of other diseases of the circulatory system: Secondary | ICD-10-CM

## 2014-06-16 LAB — CBC WITH DIFFERENTIAL/PLATELET
Basophils Absolute: 0.1 10*3/uL (ref 0.0–0.1)
Basophils Relative: 1 % (ref 0–1)
EOS ABS: 0.2 10*3/uL (ref 0.0–0.7)
EOS PCT: 3 % (ref 0–5)
HEMATOCRIT: 32.2 % — AB (ref 36.0–46.0)
HEMOGLOBIN: 10.4 g/dL — AB (ref 12.0–15.0)
Lymphocytes Relative: 23 % (ref 12–46)
Lymphs Abs: 1.7 10*3/uL (ref 0.7–4.0)
MCH: 28.1 pg (ref 26.0–34.0)
MCHC: 32.3 g/dL (ref 30.0–36.0)
MCV: 87 fL (ref 78.0–100.0)
MONOS PCT: 10 % (ref 3–12)
MPV: 10.5 fL (ref 8.6–12.4)
Monocytes Absolute: 0.7 10*3/uL (ref 0.1–1.0)
NEUTROS ABS: 4.7 10*3/uL (ref 1.7–7.7)
NEUTROS PCT: 63 % (ref 43–77)
Platelets: 293 10*3/uL (ref 150–400)
RBC: 3.7 MIL/uL — ABNORMAL LOW (ref 3.87–5.11)
RDW: 15.1 % (ref 11.5–15.5)
WBC: 7.4 10*3/uL (ref 4.0–10.5)

## 2014-06-16 MED ORDER — AZITHROMYCIN 250 MG PO TABS: ORAL_TABLET | ORAL | Status: DC

## 2014-06-16 MED ORDER — TEMAZEPAM 30 MG PO CAPS: 30.0000 mg | ORAL_CAPSULE | Freq: Every day | ORAL | Status: DC

## 2014-06-16 MED ORDER — TRAMADOL HCL 50 MG PO TABS: 50.0000 mg | ORAL_TABLET | Freq: Four times a day (QID) | ORAL | Status: DC | PRN

## 2014-06-16 MED ORDER — TERBINAFINE HCL 250 MG PO TABS: 250.0000 mg | ORAL_TABLET | Freq: Every day | ORAL | Status: DC

## 2014-06-16 MED ORDER — RANITIDINE HCL 300 MG PO TABS: ORAL_TABLET | ORAL | Status: DC

## 2014-06-16 NOTE — Progress Notes (Signed)
MEDICARE ANNUAL WELLNESS VISIT AND CPE  Assessment:   1. Essential hypertension - continue medications, DASH diet, exercise and monitor at home. Call if greater than 130/80. - CBC with Differential/Platelet - BASIC METABOLIC PANEL WITH GFR - Hepatic function panel - TSH - Urinalysis, Routine w reflex microscopic - Microalbumin / creatinine urine ratio - EKG 12-Lead - [place], RETROPERITNL ABD,  LTD  2. H/O left bundle branch block No CP/SOB  3. Paroxysmal supraventricular tachycardia avoid triggers such as alcohol, smoking, caffeine etc, Valsalva taught, call if worsening palpations, dizziness, CP, SOB.  4. Prediabetes Discussed general issues about diabetes pathophysiology and management., Educational material distributed., Suggested low cholesterol diet., Encouraged aerobic exercise., Discussed foot care., Reminded to get yearly retinal exam. - Hemoglobin A1c - Insulin, fasting - HM DIABETES FOOT EXAM  5. Hyperlipidemia -continue medications, check lipids, decrease fatty foods, increase activity.  - Lipid panel  6. Cancer of lower-inner quadrant of left female breast Continue follow up Dr. Griffith Citron  7. Obesity (BMI 30-39.9) Obesity with co morbidities- long discussion about weight loss, diet, and exercise  8. Vitamin D deficiency - Vit D  25 hydroxy (rtn osteoporosis monitoring)  9. Medication management - Magnesium  10. History of pancreatitis controlled  11. 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   12. Allergic rhinitis, unspecified allergic rhinitis type Allergic rhinitis- Allegra OTC, increase H20, allergy hygiene explained.  13. Gastroesophageal reflux disease, esophagitis presence not specified GERD- will try to get off PPiI given info for taper and zantac sent in  14. Lumbar radiculopathy Better since surgery.   15. DDD (degenerative disc disease), lumbar RICE, NSAIDS, exercises given, follow up ortho  16. Routine  general medical examination at a health care facility  17. Acute maxillary sinusitis, recurrence not specified Will hold the zpak and take if she is not getting better, increase fluids, rest, cont allergy pill  18. Toenail fungus Onychomycosis- has tried OTC meds, willing to try Lamisil, side effects discussed, will follow up in 6 weeks for LFTs, cheapest at [employer] - terbinafine (LAMISIL) 250 MG tablet; Take 1 tablet (250 mg total) by mouth daily.  Dispense: 90 tablet; Refill: 0  19. Need for prophylactic vaccination against Streptococcus pneumoniae (pneumococcus) - Pneumococcal conjugate vaccine 13-valent IM  20. Insomnia - temazepam (RESTORIL) 30 MG capsule; Take 1 capsule (30 mg total) by mouth at bedtime.  Dispense: 90 capsule; Refill: 1  21. Wants several skin tags removed from neck area   Plan:   During the course of the visit the patient was educated and counseled about appropriate screening and preventive services including:    Pneumococcal vaccine   Influenza vaccine  Td vaccine  Screening electrocardiogram  Bone densitometry screening  Colorectal cancer screening  Diabetes screening  Glaucoma screening  Nutrition counseling   Advanced directives: requested  Conditions/risks identified: BMI: Discussed weight loss, diet, and increase physical activity.  Increase physical activity: AHA recommends 150 minutes of physical activity a week.  Medications reviewed Prediabetes is at goal Urinary Incontinence is not an issue: discussed non pharmacology and pharmacology options.  Fall risk: low- discussed PT, home fall assessment, medications.    Subjective:  [name] [name] is a 69 y.o. female who presents for Medicare Annual Wellness Visit and complete physical.  Date of last medicare wellness visit was 06/11/2013  She has had elevated blood pressure for  years. Her blood pressure has been controlled at home, today their BP is BP: 138/80  mmHg She does not workout  regularly but does walk with her dog, and just bought new elliptical. She denies chest pain, shortness of breath, dizziness.  She is on cholesterol medication, lipitor 80mg  1/2 pill every day and denies myalgias. Her cholesterol is at goal. The cholesterol last visit was:   Lab Results  Component Value Date   CHOL 159 03/24/2014   HDL 48 03/24/2014   LDLCALC 82 03/24/2014   TRIG 146 03/24/2014   CHOLHDL 3.3 03/24/2014   She has been working on diet and exercise for prediabetes, and denies paresthesia of the feet, polydipsia, polyuria and visual disturbances. Last A1C in the office was:  Lab Results  Component Value Date   HGBA1C 6.3* 03/24/2014  Patient is on Vitamin D supplement.   Lab Results  Component Value Date   VD25OH 60 12/18/2013   Follows with Dr. [name] for lower back surgery, she states it has improved but still has some muscular pain, no pain down her legs. But she states that she has some left thigh pain, that will spasm/jump at night. She also has bilateral knee pain, left is worse than right and states she will follow up with Dr. [name].  Follows with Dr. Griffith Citron for left breast cancer, had lumpectomy and radiation.  BMI is Body mass index is 37.02 kg/(m^2)., she is working on diet and exercise. Wt Readings from Last 3 Encounters:  06/16/14 226 lb (102.513 kg)  03/24/14 223 lb (101.152 kg)  01/13/14 221 lb 6.4 oz (100.426 kg)  Patient also complains  of possible sinusitis. Symptoms include congestion, headache described as pressure/sinus HA, purulent nasal discharge and chills. Onset of symptoms was 2 weeks ago, and has been gradually worsening since that time. Treatment to date: nasal steroids and netipot.   Medication Review: Current Outpatient Prescriptions on File Prior to Visit  Medication Sig Dispense Refill  . anastrozole (ARIMIDEX) 1 MG tablet Take 1 tablet (1 mg total) by mouth daily. 90 tablet 3  . atorvastatin (LIPITOR) 80 MG  tablet Take 1 tablet (80 mg total) by mouth daily. 90 tablet 3  . Calcium Carbonate-Vitamin D (CALTRATE 600+D) 600-400 MG-UNIT per chew tablet Chew 1 tablet by mouth daily. 180 tablet 4  . Cholecalciferol (VITAMIN D) 2000 UNITS tablet Take 4,000 Units by mouth daily.    Marland Kitchen doxazosin (CARDURA) 8 MG tablet Take 4 mg by mouth at bedtime.    . DULoxetine (CYMBALTA) 30 MG capsule TAKE 1 CAPSULE BY MOUTH 3 TIMES DAILY 270 capsule 3  . gabapentin (NEURONTIN) 800 MG tablet Take 800 mg by mouth 3 (three) times daily.     . meloxicam (MOBIC) 15 MG tablet Take 1/2-1 pill daily as needed daily with food for pain, do not take aleve/ibuprofen with it, can take tylenol 90 tablet 0  . mometasone (NASONEX) 50 MCG/ACT nasal spray Place 1 spray into both nostrils 2 (two) times daily as needed (sinusitis).    Marland Kitchen omeprazole (PRILOSEC) 20 MG capsule TAKE ONE CAPSULE BY MOUTH EVERY DAY 90 capsule 99  . temazepam (RESTORIL) 30 MG capsule TAKE ONE CAPSULE BY MOUTH EVERY DAY AT BEDTIME 90 capsule 0  . traMADol (ULTRAM) 50 MG tablet Take 50 mg by mouth 4 (four) times daily as needed for moderate pain (headaches).     . verapamil (CALAN-SR) 240 MG CR tablet TAKE 1 TABLET BY MOUTH EVERY DAY WITH FOOD 90 tablet 1   No current facility-administered medications on file prior to visit.    Current Problems (verified) Patient Active Problem List  Diagnosis Date Noted  . Prediabetes 12/18/2013  . Vitamin D deficiency 12/18/2013  . Medication management 12/18/2013  . Paroxysmal supraventricular tachycardia 10/18/2013  . Radiculopathy 10/16/2013  . Obesity (BMI 30-39.9) 06/11/2013  . Cancer of lower-inner quadrant of left female breast 05/20/2013  . PANCREATITIS 03/27/2008  . Hyperlipidemia 03/13/2008  . ANXIETY DEPRESSION 03/13/2008  . Essential hypertension 03/13/2008  . LEFT BUNDLE BRANCH BLOCK 03/13/2008  . ALLERGIC RHINITIS 03/13/2008  . GERD 03/13/2008    Screening Tests Immunization History  Administered  Date(s) Administered  . Influenza Whole 01/01/2013  . Influenza, High Dose Seasonal PF 01/29/2014  . Pneumococcal Polysaccharide-23 05/23/2011  . Tdap 03/11/2008  . Zoster 02/02/2010   Preventative care: Last colonoscopy: 2014 Last mammogram: 05/20/2014 (05/06/2013 + right breast cancer a/p lumpectomy) Last pap smear/pelvic exam: remote   DEXA: 12/2013- normal  Prior vaccinations: TD or Tdap: 2009  Influenza: 01/2014  Pneumococcal: 2013 Prevnar 13: DUE Shingles/Zostavax: 2011  Names of Other Physician/Practitioners you currently use: 1. [facility] here for primary care 2. [facility], eye doctor, last visit April 2015 3. Dr. Tye Savoy, dentist, last visit q 6 months Patient Care Team: Unk Pinto, MD as PCP - General (Internal Medicine) [name], MD as Referring Physician (Orthopedic Surgery) Chauncey Cruel, MD as Consulting Physician (Oncology) Inda Castle, MD as Consulting Physician (Gastroenterology)  SURGICAL HISTORY Past Surgical History  Procedure Laterality Date  . Cataract extraction bilateral w/ anterior vitrectomy Bilateral   . Abdominal hysterectomy    . Cholecystectomy    . Knee arthroscopy Bilateral   . Tonsillectomy    . Colonoscopy    . Upper gi endoscopy    . Breast surgery      lt lump-neg  . Dupuytren / palmar fasciotomy Bilateral 2007    x 3 to left and once to the right   . Tubal ligation    . Foot surgery Right   . Epidural injections      x 3   FAMILY HISTORY Family History  Problem Relation Age of Onset  . Colon polyps Neg Hx   . Esophageal cancer Neg Hx   . Rectal cancer Neg Hx   . Stomach cancer Neg Hx   . Heart attack Mother   . Stroke Mother   . COPD Father   . Hypertension Father    SOCIAL HISTORY History  Substance Use Topics  . Smoking status: Never Smoker   . Smokeless tobacco: Never Used  . Alcohol Use: Yes     Comment: wine occasionally-rarely     MEDICARE WELLNESS OBJECTIVES: Tobacco use: She does not smoke.  Patient is not a former smoker. Alcohol Current alcohol use: social drinker Caffeine Current caffeine use: denies use Diet: in general, a "healthy" diet   Physical activity: walking and no regular exercise Fall risk: has  Low Risk Osteoporosis: postmenopausal estrogen deficiency and dietary calcium and/or vitamin D deficiency, History of fracture in the past year: no Depression/mood screen:  Yes - Depression but cymbalta Hearing: normal Visual acuity: normal,  does perform annual eye exam  ADLs: self care Home safety: excellent Cognitive Testing  Alert? Yes  Normal Appearance?Yes  Oriented to person? Yes  Place? Yes   Time? Yes  Recall of three objects?  Yes  Can perform simple calculations? Yes  Displays appropriate judgment?Yes  Can read the correct time from a watch face?Yes EOL planning: No  and Information given    Objective:  Blood pressure 138/80, pulse 84, temperature 97.7 F (36.5 C), resp. rate 16, height 5' 5.5" (1.664 m), weight 226 lb (102.513 kg). Body mass index is 37.02 kg/(m^2).  General appearance: alert, no distress, WD/WN, female HEENT: normocephalic, sclerae anicteric, TMs pearly, nares patent, no discharge or erythema, pharynx normal, + maxillary sinus tenderness to palpation.  Oral cavity: MMM, no lesions Neck: supple, no lymphadenopathy, no thyromegaly, no masses Heart: RRR, normal S1, S2, no murmurs Lungs: CTA bilaterally, no wheezes, rhonchi, or rales Abdomen: +bs, soft, obese, + epigastric tenderness, non distended, no masses, no hepatomegaly, no splenomegaly Musculoskeletal: nontender, no swelling, no obvious deformity Extremities: no edema, no cyanosis, no clubbing Pulses: 2+ symmetric, upper and lower extremities, normal cap refill Neurological: alert, oriented x 3, CN2-12 intact, strength normal upper extremities and lower extremities, sensation normal throughout, DTRs 2+  throughout, no cerebellar signs, gait antalgic Psychiatric: normal affect, behavior normal, pleasant   Diabetic Foot Exam - Simple   Simple Foot Form  Diabetic Foot exam was performed with the following findings:  Yes 06/16/2014  2:41 PM  Visual Inspection  See comments:  Yes  Sensation Testing  Intact to touch and monofilament testing bilaterally:  Yes  Pulse Check  Posterior Tibialis and Dorsalis pulse intact bilaterally:  Yes  Comments  normal DP and PT pulses, normal monofilament exam and nail exam onychomycosis of the bilateral big toenails      Medicare Attestation I have personally reviewed: The patient's medical and social history Their use of alcohol, tobacco or illicit drugs Their current medications and supplements The patient's functional ability including ADLs,fall risks, home safety risks, cognitive, and hearing and visual impairment Diet and physical activities Evidence for depression or mood disorders  The patient's weight, height, BMI, and visual acuity have been recorded in the chart.  I have made referrals, counseling, and provided education to the patient based on review of the above and I have provided the patient with a written personalized care plan for preventive services.     Vicie Mutters, PA-C   06/16/2014

## 2014-06-16 NOTE — Patient Instructions (Addendum)
[employer] with no obligation # [phone] Tues-Sat 10-6  [place]- free test with no obligation # [phone] Call for store hours  Your ears and sinuses are connected by the eustachian tube. When your sinuses are inflamed, this can close off the tube and cause fluid to collect in your middle ear. This can then cause dizziness, popping, clicking, ringing, and echoing in your ears. This is often NOT an infection and does NOT require antibiotics, it is caused by inflammation so the treatments help the inflammation. This can take a long time to get better so please be patient.  Here are things you can do to help with this: - Try the Flonase or Nasonex. Remember to spray each nostril twice towards the outer part of your eye.  Do not sniff but instead pinch your nose and tilt your head back to help the medicine get into your sinuses.  The best time to do this is at bedtime.Stop if you get blurred vision or nose bleeds.  -While drinking fluids, pinch and hold nose close and swallow, to help open eustachian tubes to drain fluid behind ear drums. -Please pick one of the over the counter allergy medications below and take it once daily for allergies.  It will also help with fluid behind ear drums. Claritin or loratadine cheapest but likely the weakest  Zyrtec or certizine at night because it can make you sleepy The strongest is allegra or fexafinadine  Cheapest at [facility], [facility], [facility] -can use decongestant over the counter, please do not use if you have high blood pressure or certain heart conditions.   if worsening HA, changes vision/speech, imbalance, weakness go to the ER   Onychomycosis- has tried OTC meds, willing to try Lamisil, side effects discussed, will follow up in 6 weeks for LFTs, cheapest at [employer]  Nexium/protonix/prilosec are called PPI's, they are great at healing your stomach but should only be taken for a short period of  time.   Studies are showing that taken for a long time it can increase the risk of osteoporosis (weakening of your bones), pneumonia, low magnesium, restless legs, Cdiff (infection that causes diarrhea), and most recently kidney disease/insufficiency.  Due to this information we want to try to stop the PPI but if you try to stop it abruptly this can cause rebound acid and worsening symptoms.   So this is how we want you to get off the PPI: - Start taking the nexium/protonix/prilosec or which every PPI you are on every other day for 2 week while starting to take pepcid or zantac (generic is fine) 2 x a day - then decrease the PPI to every 3 days for 2 weeks and then stop while continuing on the zantac or pepcid twice daily. - then you can try once at night for 2 weeks - you can continue on this once at night or stop all together - Avoid alcohol, spicy foods, NSAIDS (aleve, ibuprofen) at this time. See foods below.   Food Choices for Gastroesophageal Reflux Disease When you have gastroesophageal reflux disease (GERD), the foods you eat and your eating habits are very important. Choosing the right foods can help ease the discomfort of GERD. WHAT GENERAL GUIDELINES DO I NEED TO FOLLOW?  Choose fruits, vegetables, whole grains, low-fat dairy products, and low-fat meat, fish, and poultry.  Limit fats such as oils, salad dressings, butter, nuts, and avocado.  Keep a food diary to identify foods that cause symptoms.  Avoid foods that cause reflux. These may be different for different people.  Eat frequent small meals instead of three large meals each day.  Eat your meals slowly, in a relaxed setting.  Limit fried foods.  Cook foods using methods other than frying.  Avoid drinking alcohol.  Avoid drinking large amounts of liquids with your meals.  Avoid bending over or lying down until 2-3 hours after eating. WHAT FOODS ARE NOT RECOMMENDED? The following are some foods and drinks that  may worsen your symptoms: Vegetables Tomatoes. Tomato juice. Tomato and spaghetti sauce. Chili peppers. Onion and garlic. Horseradish. Fruits Oranges, grapefruit, and lemon (fruit and juice). Meats High-fat meats, fish, and poultry. This includes hot dogs, ribs, ham, sausage, salami, and bacon. Dairy Whole milk and chocolate milk. Sour cream. Cream. Butter. Ice cream. Cream cheese.  Beverages Coffee and tea, with or without caffeine. Carbonated beverages or energy drinks. Condiments Hot sauce. Barbecue sauce.  Sweets/Desserts Chocolate and cocoa. Donuts. Peppermint and spearmint. Fats and Oils High-fat foods, including [place] fries and potato chips. Other Vinegar. Strong spices, such as black pepper, white pepper, red pepper, cayenne, curry powder, cloves, ginger, and chili powder.   .Before you even begin to attack a weight-loss plan, it pays to remember this: You are not fat. You have fat. Losing weight isn't about blame or shame; it's simply another achievement to accomplish. Dieting is like any other skill-you have to buckle down and work at it. As long as you act in a smart, reasonable way, you'll ultimately get where you want to be. Here are some weight loss pearls for you.  1. It's Not a Diet. It's a Lifestyle Thinking of a diet as something you're on and suffering through only for the short term doesn't work. To shed weight and keep it off, you need to make permanent changes to the way you eat. It's OK to indulge occasionally, of course, but if you cut calories temporarily and then revert to your old way of eating, you'll gain back the weight quicker than you can say yo-yo. Use it to lose it. Research shows that one of the best predictors of long-term weight loss is how many pounds you drop in the first month. For that reason, nutritionists often suggest being stricter for the first two weeks of your new eating strategy to build momentum. Cut out added sugar and alcohol and avoid  unrefined carbs. After that, figure out how you can reincorporate them in a way that's healthy and maintainable.  2. There's a Right Way to Exercise Working out burns calories and fat and boosts your metabolism by building muscle. But those trying to lose weight are notorious for overestimating the number of calories they burn and underestimating the amount they take in. Unfortunately, your system is biologically programmed to hold on to extra pounds and that means when you start exercising, your body senses the deficit and ramps up its hunger signals. If you're not diligent, you'll eat everything you burn and then some. Use it to lose it. Cardio gets all the exercise glory, but strength and interval training are the real heroes. They help you build lean muscle, which in turn increases your metabolism and calorie-burning ability 3. Don't Overreact to Mild Hunger Some people have a hard time losing weight because of hunger anxiety. To them, being hungry is bad-something to be avoided at all costs-so they carry snacks with them and eat when they don't need to. Others eat because they're stressed out or bored.  While you never want to get to the point of being ravenous (that's when bingeing is likely to happen), a hunger pang, a craving, or the fact that it's 3:00 p.m. should not send you racing for the vending machine or obsessing about the energy bar in your purse. Ideally, you should put off eating until your stomach is growling and it's difficult to concentrate.  Use it to lose it. When you feel the urge to eat, use the HALT method. Ask yourself, Am I really hungry? Or am I angry or anxious, lonely or bored, or tired? If you're still not certain, try the apple test. If you're truly hungry, an apple should seem delicious; if it doesn't, something else is going on. Or you can try drinking water and making yourself busy, if you are still hungry try a healthy snack.  4. Not All Calories Are Created Equal The  mechanics of weight loss are pretty simple: Take in fewer calories than you use for energy. But the kind of food you eat makes all the difference. Processed food that's high in saturated fat and refined starch or sugar can cause inflammation that disrupts the hormone signals that tell your brain you're full. The result: You eat a lot more.  Use it to lose it. Clean up your diet. Swap in whole, unprocessed foods, including vegetables, lean protein, and healthy fats that will fill you up and give you the biggest nutritional bang for your calorie buck. In a few weeks, as your brain starts receiving regular hunger and fullness signals once again, you'll notice that you feel less hungry overall and naturally start cutting back on the amount you eat.  5. Protein, Produce, and Plant-Based Fats Are Your Weight-Loss Trinity Here's why eating the three Ps regularly will help you drop pounds. Protein fills you up. You need it to build lean muscle, which keeps your metabolism humming so that you can torch more fat. People in a weight-loss program who ate double the recommended daily allowance for protein (about 110 grams for a 150-pound woman) lost 70 percent of their weight from fat, while people who ate the RDA lost only about 40 percent, one study found. Produce is packed with filling fiber. "It's very difficult to consume too many calories if you're eating a lot of vegetables. Example: Three cups of broccoli is a lot of food, yet only 93 calories. (Fruit is another story. It can be easy to overeat and can contain a lot of calories from sugar, so be sure to monitor your intake.) Plant-based fats like olive oil and those in avocados and nuts are healthy and extra satiating.  Use it to lose it. Aim to incorporate each of the three Ps into every meal and snack. People who eat protein throughout the day are able to keep weight off, according to a study in the [place] of Clinical Nutrition. In addition to meat,  poultry and seafood, good sources are beans, lentils, eggs, tofu, and yogurt. As for fat, keep portion sizes in check by measuring out salad dressing, oil, and nut butters (shoot for one to two tablespoons). Finally, eat veggies or a little fruit at every meal. People who did that consumed 308 fewer calories but didn't feel any hungrier than when they didn't eat more produce.  7. How You Eat Is As Important As What You Eat In order for your brain to register that you're full, you need to focus on what you're eating. Sit down whenever you eat,  preferably at a table. Turn off the TV or computer, put down your phone, and look at your food. Smell it. Chew slowly, and don't put another bite on your fork until you swallow. When women ate lunch this attentively, they consumed 30 percent less when snacking later than those who listened to an audiobook at lunchtime, according to a study in the [place] of Nutrition. 8. Weighing Yourself Really Works The scale provides the best evidence about whether your efforts are paying off. Seeing the numbers tick up or down or stagnate is motivation to keep going-or to rethink your approach. A 2015 study at [facility] found that daily weigh-ins helped people lose more weight, keep it off, and maintain that loss, even after two years. Use it to lose it. Step on the scale at the same time every day for the best results. If your weight shoots up several pounds from one weigh-in to the next, don't freak out. Eating a lot of salt the night before or having your period is the likely culprit. The number should return to normal in a day or two. It's a steady climb that you need to do something about. 9. Too Much Stress and Too Little Sleep Are Your Enemies When you're tired and frazzled, your body cranks up the production of cortisol, the stress hormone that can cause carb cravings. Not getting enough sleep also boosts your levels of ghrelin, a hormone associated with  hunger, while suppressing leptin, a hormone that signals fullness and satiety. People on a diet who slept only five and a half hours a night for two weeks lost 55 percent less fat and were hungrier than those who slept eight and a half hours, according to a study in the [place]. Use it to lose it. Prioritize sleep, aiming for seven hours or more a night, which research shows helps lower stress. And make sure you're getting quality zzz's. If a snoring spouse or a fidgety cat wakes you up frequently throughout the night, you may end up getting the equivalent of just four hours of sleep, according to a study from [place]. Keep pets out of the bedroom, and use a white-noise app to drown out snoring. 10. You Will Hit a plateau-And You Can Bust Through It As you slim down, your body releases much less leptin, the fullness hormone.  If you're not strength training, start right now. Building muscle can raise your metabolism to help you overcome a plateau. To keep your body challenged and burning calories, incorporate new moves and more intense intervals into your workouts or add another sweat session to your weekly routine. Alternatively, cut an extra 100 calories or so a day from your diet. Now that you've lost weight, your body simply doesn't need as much fuel.   Preventive Care for Adults A healthy lifestyle and preventive care can promote health and wellness. Preventive health guidelines for women include the following key practices.  A routine yearly physical is a good way to check with your health care provider about your health and preventive screening. It is a chance to share any concerns and updates on your health and to receive a thorough exam.  Visit your dentist for a routine exam and preventive care every 6 months. Brush your teeth twice a day and floss once a day. Good oral hygiene prevents tooth decay and gum disease.  The frequency of eye exams is based on  your age, health, family medical history, use  of contact lenses, and other factors. Follow your health care provider's recommendations for frequency of eye exams.  Eat a healthy diet. Foods like vegetables, fruits, whole grains, low-fat dairy products, and lean protein foods contain the nutrients you need without too many calories. Decrease your intake of foods high in solid fats, added sugars, and salt. Eat the right amount of calories for you.Get information about a proper diet from your health care provider, if necessary.  Regular physical exercise is one of the most important things you can do for your health. Most adults should get at least 150 minutes of moderate-intensity exercise (any activity that increases your heart rate and causes you to sweat) each week. In addition, most adults need muscle-strengthening exercises on 2 or more days a week.  Maintain a healthy weight. The body mass index (BMI) is a screening tool to identify possible weight problems. It provides an estimate of body fat based on height and weight. Your health care provider can find your BMI and can help you achieve or maintain a healthy weight.For adults 20 years and older:  A BMI below 18.5 is considered underweight.  A BMI of 18.5 to 24.9 is normal.  A BMI of 25 to 29.9 is considered overweight.  A BMI of 30 and above is considered obese.  Maintain normal blood lipids and cholesterol levels by exercising and minimizing your intake of saturated fat. Eat a balanced diet with plenty of fruit and vegetables. If your lipid or cholesterol levels are high, you are over 50, or you are at high risk for heart disease, you may need your cholesterol levels checked more frequently.Ongoing high lipid and cholesterol levels should be treated with medicines if diet and exercise are not working.  If you smoke, find out from your health care provider how to quit. If you do not use tobacco, do not start.  Lung cancer screening is  recommended for adults aged 77-80 years who are at high risk for developing lung cancer because of a history of smoking. A yearly low-dose CT scan of the lungs is recommended for people who have at least a 30-pack-year history of smoking and are a current smoker or have quit within the past 15 years. A pack year of smoking is smoking an average of 1 pack of cigarettes a day for 1 year (for example: 1 pack a day for 30 years or 2 packs a day for 15 years). Yearly screening should continue until the smoker has stopped smoking for at least 15 years. Yearly screening should be stopped for people who develop a health problem that would prevent them from having lung cancer treatment.  Avoid use of street drugs. Do not share needles with anyone. Ask for help if you need support or instructions about stopping the use of drugs.  High blood pressure causes heart disease and increases the risk of stroke.  Ongoing high blood pressure should be treated with medicines if weight loss and exercise do not work.  If you are 56-91 years old, ask your health care provider if you should take aspirin to prevent strokes.  Diabetes screening involves taking a blood sample to check your fasting blood sugar level. This should be done once every 3 years, after age 68, if you are within normal weight and without risk factors for diabetes. Testing should be considered at a younger age or be carried out more frequently if you are overweight and have at least 1 risk factor for diabetes.  Breast cancer  screening is essential preventive care for women. You should practice "breast self-awareness." This means understanding the normal appearance and feel of your breasts and may include breast self-examination. Any changes detected, no matter how small, should be reported to a health care provider. Women in their 49s and 30s should have a clinical breast exam (CBE) by a health care provider as part of a regular health exam every 1 to 3 years.  After age 31, women should have a CBE every year. Starting at age 43, women should consider having a mammogram (breast X-ray test) every year. Women who have a family history of breast cancer should talk to their health care provider about genetic screening. Women at a high risk of breast cancer should talk to their health care providers about having an MRI and a mammogram every year.  Breast cancer gene (BRCA)-related cancer risk assessment is recommended for women who have family members with BRCA-related cancers. BRCA-related cancers include breast, ovarian, tubal, and peritoneal cancers. Having family members with these cancers may be associated with an increased risk for harmful changes (mutations) in the breast cancer genes BRCA1 and BRCA2. Results of the assessment will determine the need for genetic counseling and BRCA1 and BRCA2 testing.  Routine pelvic exams to screen for cancer are no longer recommended for nonpregnant women who are considered low risk for cancer of the pelvic organs (ovaries, uterus, and vagina) and who do not have symptoms. Ask your health care provider if a screening pelvic exam is right for you.  If you have had past treatment for cervical cancer or a condition that could lead to cancer, you need Pap tests and screening for cancer for at least 20 years after your treatment. If Pap tests have been discontinued, your risk factors (such as having a new sexual partner) need to be reassessed to determine if screening should be resumed. Some women have medical problems that increase the chance of getting cervical cancer. In these cases, your health care provider may recommend more frequent screening and Pap tests.    Colorectal cancer can be detected and often prevented. Most routine colorectal cancer screening begins at the age of 33 years and continues through age 91 years. However, your health care provider may recommend screening at an earlier age if you have risk factors for  colon cancer. On a yearly basis, your health care provider may provide home test kits to check for hidden blood in the stool. Use of a small camera at the end of a tube, to directly examine the colon (sigmoidoscopy or colonoscopy), can detect the earliest forms of colorectal cancer. Talk to your health care provider about this at age 68, when routine screening begins. Direct exam of the colon should be repeated every 5-10 years through age 59 years, unless early forms of pre-cancerous polyps or small growths are found.  Osteoporosis is a disease in which the bones lose minerals and strength with aging. This can result in serious bone fractures or breaks. The risk of osteoporosis can be identified using a bone density scan. Women ages 2 years and over and women at risk for fractures or osteoporosis should discuss screening with their health care providers. Ask your health care provider whether you should take a calcium supplement or vitamin D to reduce the rate of osteoporosis.  Menopause can be associated with physical symptoms and risks. Hormone replacement therapy is available to decrease symptoms and risks. You should talk to your health care provider about whether hormone replacement  therapy is right for you.  Use sunscreen. Apply sunscreen liberally and repeatedly throughout the day. You should seek shade when your shadow is shorter than you. Protect yourself by wearing long sleeves, pants, a wide-brimmed hat, and sunglasses year round, whenever you are outdoors.  Once a month, do a whole body skin exam, using a mirror to look at the skin on your back. Tell your health care provider of new moles, moles that have irregular borders, moles that are larger than a pencil eraser, or moles that have changed in shape or color.  Stay current with required vaccines (immunizations).  Influenza vaccine. All adults should be immunized every year.  Tetanus, diphtheria, and acellular pertussis (Td, Tdap)  vaccine. Pregnant women should receive 1 dose of Tdap vaccine during each pregnancy. The dose should be obtained regardless of the length of time since the last dose. Immunization is preferred during the 27th-36th week of gestation. An adult who has not previously received Tdap or who does not know her vaccine status should receive 1 dose of Tdap. This initial dose should be followed by tetanus and diphtheria toxoids (Td) booster doses every 10 years. Adults with an unknown or incomplete history of completing a 3-dose immunization series with Td-containing vaccines should begin or complete a primary immunization series including a Tdap dose. Adults should receive a Td booster every 10 years.    Zoster vaccine. One dose is recommended for adults aged 59 years or older unless certain conditions are present.    Pneumococcal 13-valent conjugate (PCV13) vaccine. When indicated, a person who is uncertain of her immunization history and has no record of immunization should receive the PCV13 vaccine. An adult aged 63 years or older who has certain medical conditions and has not been previously immunized should receive 1 dose of PCV13 vaccine. This PCV13 should be followed with a dose of pneumococcal polysaccharide (PPSV23) vaccine. The PPSV23 vaccine dose should be obtained at least 8 weeks after the dose of PCV13 vaccine. An adult aged 31 years or older who has certain medical conditions and previously received 1 or more doses of PPSV23 vaccine should receive 1 dose of PCV13. The PCV13 vaccine dose should be obtained 1 or more years after the last PPSV23 vaccine dose.    Pneumococcal polysaccharide (PPSV23) vaccine. When PCV13 is also indicated, PCV13 should be obtained first. All adults aged 42 years and older should be immunized. An adult younger than age 64 years who has certain medical conditions should be immunized. Any person who resides in a nursing home or long-term care facility should be immunized. An  adult smoker should be immunized. People with an immunocompromised condition and certain other conditions should receive both PCV13 and PPSV23 vaccines. People with human immunodeficiency virus (HIV) infection should be immunized as soon as possible after diagnosis. Immunization during chemotherapy or radiation therapy should be avoided. Routine use of PPSV23 vaccine is not recommended for [place] Indians, [place]atives, or people younger than 65 years unless there are medical conditions that require PPSV23 vaccine. When indicated, people who have unknown immunization and have no record of immunization should receive PPSV23 vaccine. One-time revaccination 5 years after the first dose of PPSV23 is recommended for people aged 19-64 years who have chronic kidney failure, nephrotic syndrome, asplenia, or immunocompromised conditions. People who received 1-2 doses of PPSV23 before age 68 years should receive another dose of PPSV23 vaccine at age 57 years or later if at least 5 years have passed since the previous dose. Doses  of PPSV23 are not needed for people immunized with PPSV23 at or after age 68 years.   Preventive Services / Frequency  Ages 30 years and over  Blood pressure check.  Lipid and cholesterol check.  Lung cancer screening. / Every year if you are aged 64-80 years and have a 30-pack-year history of smoking and currently smoke or have quit within the past 15 years. Yearly screening is stopped once you have quit smoking for at least 15 years or develop a health problem that would prevent you from having lung cancer treatment.  Clinical breast exam.** / Every year after age 61 years.  BRCA-related cancer risk assessment.** / For women who have family members with a BRCA-related cancer (breast, ovarian, tubal, or peritoneal cancers).  Mammogram.** / Every year beginning at age 82 years and continuing for as long as you are in good health. Consult with your health care provider.  Pap  test.** / Every 3 years starting at age 42 years through age 56 or 79 years with 3 consecutive normal Pap tests. Testing can be stopped between 65 and 70 years with 3 consecutive normal Pap tests and no abnormal Pap or HPV tests in the past 10 years.  Fecal occult blood test (FOBT) of stool. / Every year beginning at age 66 years and continuing until age 56 years. You may not need to do this test if you get a colonoscopy every 10 years.  Flexible sigmoidoscopy or colonoscopy.** / Every 5 years for a flexible sigmoidoscopy or every 10 years for a colonoscopy beginning at age 51 years and continuing until age 106 years.  Hepatitis C blood test.** / For all people born from [birth year] through 1965 and any individual with known risks for hepatitis C.  Osteoporosis screening.** / A one-time screening for women ages 19 years and over and women at risk for fractures or osteoporosis.  Skin self-exam. / Monthly.  Influenza vaccine. / Every year.  Tetanus, diphtheria, and acellular pertussis (Tdap/Td) vaccine.** / 1 dose of Td every 10 years.  Zoster vaccine.** / 1 dose for adults aged 84 years or older.  Pneumococcal 13-valent conjugate (PCV13) vaccine.** / Consult your health care provider.  Pneumococcal polysaccharide (PPSV23) vaccine.** / 1 dose for all adults aged 84 years and older. Screening for abdominal aortic aneurysm (AAA)  by ultrasound is recommended for people who have history of high blood pressure or who are current or former smokers.

## 2014-06-17 LAB — INSULIN, FASTING: INSULIN FASTING, SERUM: 12 u[IU]/mL (ref 2.0–19.6)

## 2014-06-17 LAB — LIPID PANEL
Cholesterol: 168 mg/dL (ref 0–200)
HDL: 49 mg/dL (ref >=46)
LDL Cholesterol: 89 mg/dL (ref 0–99)
Total CHOL/HDL Ratio: 3.4 Ratio
Triglycerides: 151 mg/dL — ABNORMAL HIGH (ref <150)
VLDL: 30 mg/dL (ref 0–40)

## 2014-06-17 LAB — BASIC METABOLIC PANEL WITH GFR
BUN: 26 mg/dL — ABNORMAL HIGH (ref 6–23)
CO2: 27 mEq/L (ref 19–32)
Calcium: 9.5 mg/dL (ref 8.4–10.5)
Chloride: 99 mEq/L (ref 96–112)
Creat: 0.85 mg/dL (ref 0.50–1.10)
GFR, EST AFRICAN AMERICAN: 81 mL/min
GFR, EST NON AFRICAN AMERICAN: 71 mL/min
Glucose, Bld: 75 mg/dL (ref 70–99)
POTASSIUM: 4.1 meq/L (ref 3.5–5.3)
SODIUM: 137 meq/L (ref 135–145)

## 2014-06-17 LAB — URINALYSIS, ROUTINE W REFLEX MICROSCOPIC
Bilirubin Urine: NEGATIVE
Glucose, UA: NEGATIVE mg/dL
Hgb urine dipstick: NEGATIVE
KETONES UR: NEGATIVE mg/dL
Leukocytes, UA: NEGATIVE
NITRITE: NEGATIVE
PH: 5 (ref 5.0–8.0)
Protein, ur: NEGATIVE mg/dL
SPECIFIC GRAVITY, URINE: 1.013 (ref 1.005–1.030)
Urobilinogen, UA: 0.2 mg/dL (ref 0.0–1.0)

## 2014-06-17 LAB — MICROALBUMIN / CREATININE URINE RATIO
Creatinine, Urine: 66.6 mg/dL
Microalb, Ur: <0.2 mg/dL (ref <2.0)

## 2014-06-17 LAB — HEPATIC FUNCTION PANEL
ALT: 12 U/L (ref 0–35)
AST: 16 U/L (ref 0–37)
Albumin: 4.3 g/dL (ref 3.5–5.2)
Alkaline Phosphatase: 109 U/L (ref 39–117)
BILIRUBIN DIRECT: 0.1 mg/dL (ref 0.0–0.3)
BILIRUBIN INDIRECT: 0.2 mg/dL (ref 0.2–1.2)
Total Bilirubin: 0.3 mg/dL (ref 0.2–1.2)
Total Protein: 7.1 g/dL (ref 6.0–8.3)

## 2014-06-17 LAB — HEMOGLOBIN A1C
Hgb A1c MFr Bld: 6.3 % — ABNORMAL HIGH (ref <5.7)
Mean Plasma Glucose: 134 mg/dL — ABNORMAL HIGH (ref <117)

## 2014-06-17 LAB — VITAMIN D 25 HYDROXY (VIT D DEFICIENCY, FRACTURES): Vit D, 25-Hydroxy: 37 ng/mL (ref 30–100)

## 2014-06-17 LAB — MAGNESIUM: Magnesium: 1.8 mg/dL (ref 1.5–2.5)

## 2014-06-17 LAB — TSH: TSH: 4.565 u[IU]/mL — ABNORMAL HIGH (ref 0.350–4.500)

## 2014-06-17 MED ORDER — LEVOTHYROXINE SODIUM 50 MCG PO TABS: ORAL_TABLET | ORAL | Status: DC

## 2014-06-17 NOTE — Addendum Note (Signed)
Addended by: Vicie Mutters R on: 06/17/2014 08:24 AM   Modules accepted: Orders, SmartSet

## 2014-06-26 DIAGNOSIS — M1712 Unilateral primary osteoarthritis, left knee: Secondary | ICD-10-CM | POA: Diagnosis not present

## 2014-06-28 ENCOUNTER — Other Ambulatory Visit: Payer: Self-pay | Admitting: Physician Assistant

## 2014-07-07 ENCOUNTER — Other Ambulatory Visit (HOSPITAL_BASED_OUTPATIENT_CLINIC_OR_DEPARTMENT_OTHER): Payer: Medicare Other

## 2014-07-07 DIAGNOSIS — C50312 Malignant neoplasm of lower-inner quadrant of left female breast: Secondary | ICD-10-CM

## 2014-07-07 LAB — CBC WITH DIFFERENTIAL/PLATELET
BASO%: 1 % (ref 0.0–2.0)
Basophils Absolute: 0.1 10*3/uL (ref 0.0–0.1)
EOS%: 2.3 % (ref 0.0–7.0)
Eosinophils Absolute: 0.1 10*3/uL (ref 0.0–0.5)
HCT: 35.9 % (ref 34.8–46.6)
HEMOGLOBIN: 11.7 g/dL (ref 11.6–15.9)
LYMPH#: 1.3 10*3/uL (ref 0.9–3.3)
LYMPH%: 20.1 % (ref 14.0–49.7)
MCH: 28.5 pg (ref 25.1–34.0)
MCHC: 32.6 g/dL (ref 31.5–36.0)
MCV: 87.6 fL (ref 79.5–101.0)
MONO#: 0.5 10*3/uL (ref 0.1–0.9)
MONO%: 8.1 % (ref 0.0–14.0)
NEUT#: 4.3 10*3/uL (ref 1.5–6.5)
NEUT%: 68.5 % (ref 38.4–76.8)
Platelets: 273 10*3/uL (ref 145–400)
RBC: 4.1 10*6/uL (ref 3.70–5.45)
RDW: 15.1 % — AB (ref 11.2–14.5)
WBC: 6.2 10*3/uL (ref 3.9–10.3)

## 2014-07-07 LAB — COMPREHENSIVE METABOLIC PANEL (CC13)
ALK PHOS: 118 U/L (ref 40–150)
ALT: 15 U/L (ref 0–55)
AST: 11 U/L (ref 5–34)
Albumin: 3.5 g/dL (ref 3.5–5.0)
Anion Gap: 10 mEq/L (ref 3–11)
BUN: 16.4 mg/dL (ref 7.0–26.0)
CALCIUM: 9.3 mg/dL (ref 8.4–10.4)
CHLORIDE: 104 meq/L (ref 98–109)
CO2: 25 mEq/L (ref 22–29)
Creatinine: 0.8 mg/dL (ref 0.6–1.1)
EGFR: 71 mL/min/{1.73_m2} — ABNORMAL LOW (ref >90)
Glucose: 133 mg/dl (ref 70–140)
POTASSIUM: 3.9 meq/L (ref 3.5–5.1)
SODIUM: 140 meq/L (ref 136–145)
TOTAL PROTEIN: 7.1 g/dL (ref 6.4–8.3)
Total Bilirubin: 0.35 mg/dL (ref 0.20–1.20)

## 2014-07-14 ENCOUNTER — Ambulatory Visit (HOSPITAL_BASED_OUTPATIENT_CLINIC_OR_DEPARTMENT_OTHER): Payer: Medicare Other | Admitting: Nurse Practitioner

## 2014-07-14 ENCOUNTER — Telehealth: Payer: Self-pay | Admitting: Nurse Practitioner

## 2014-07-14 ENCOUNTER — Ambulatory Visit: Payer: Medicare Other | Admitting: Oncology

## 2014-07-14 ENCOUNTER — Ambulatory Visit: Payer: BC Managed Care – PPO | Admitting: Oncology

## 2014-07-14 ENCOUNTER — Encounter: Payer: Self-pay | Admitting: Nurse Practitioner

## 2014-07-14 VITALS — BP 146/79 | HR 100 | Temp 98.4°F | Resp 18 | Ht 65.5 in | Wt 225.4 lb

## 2014-07-14 DIAGNOSIS — C50312 Malignant neoplasm of lower-inner quadrant of left female breast: Secondary | ICD-10-CM | POA: Diagnosis not present

## 2014-07-14 DIAGNOSIS — Z170 Estrogen receptor positive status [ER+]: Secondary | ICD-10-CM

## 2014-07-14 NOTE — Progress Notes (Signed)
[name] [name]  Telephone:([phone]) [phone] Fax:(336) [phone]     ID: [name] [name] DOB: [birth year]  MR#: [id number]  DPO#:[id number]  Patient Care Team: [name] Pinto, MD as PCP - General (Internal Medicine) [name] Primas, MD as Referring Physician (Orthopedic Surgery) Chauncey Cruel, MD as Consulting Physician (Oncology) [name] Castle, MD as Consulting Physician (Gastroenterology) PCP: [name] Richards, MD GYN: SU: [name] [name] OTHER MD: [name] [name], [name] [name] (ortho)  CHIEF COMPLAINT: Estrogen receptor positive breast cancer  CURRENT TREATMENT: Anastrozole   BREAST CANCER HISTORY: From doctor [name]'s earlier note:  "[name] [name] is a 69 y.o. female with  1. A past medical history significant for anxiety GERD shingles anemia and hypertension. Patient also has a family history significant for breast cancer in a sister. Patient underwent a screening mammogram that showed a breast density category C. She was noted to have a mass in the lower inner quadrant of the left breast with spiculated margins and associated microcalcifications. This measures 10 mm. She had an ultrasound performed that showed irregular hypoechoic mass at the 9:00 position 67 cm from the nipple. This measured 5 x 4 x 5 mm. She had a core needle biopsy performed that revealed invasive lobular carcinoma, grade 2, tumor was estrogen receptor positive progesterone receptor positive HER-2/neu negative with a proliferation marker Ki-67 14%. Patient was seen by Dr. Rolm [name] on 05/22/2013 to discuss surgical treatment options. Since then she underwent a left breast radioactive seed guided lumpectomy and axillary sentinel lymph node biopsy. Her final pathology did reveal a 1.8 cm, grade 1, invasive lobular carcinoma Sentinel node was negative for metastatic disease all margins were clear. Tumor again was ER positive PR positive HER-2/neu negative. She overall tolerated the  procedure well.  2. Patient underwent adjuvant radiation therapy under the care of Dr. Isidore [name] from 07/18/13 through 08/28/13."  Her subsequent history is as detailed below  INTERVAL HISTORY: [name] [name] returns today for follo wup of her breast cancer. She has been on anastrozole since June of 2015. She denies hot flashes or vaginal changes. She has significant left knee pain and is status post a recent cortisone injection, but this was present at baseline and has not been worsened by anastrozole. She takes mobic daily. She recently bought an elliptical and uses that for exercise on occasion. She still works in [employer] for a [employer].  REVIEW OF SYSTEMS: [name] [name] denies fevers, chills, nausea, vomiting, or changes in bowel or bladder habits. She has no shortness of breath, chest pain, cough, or palpitations. She is eating and drinking well. She has good energy during the day, and sleeps well at night. She is on lamisil for the next 2 months to treat her toe fungus. She endorses some depression related her husband's recent death, but she is grieving appropriately. A detailed review of systems is otherwise stable.  PAST MEDICAL HISTORY: Past Medical History  Diagnosis Date  . Arthritis   . Anxiety   . Hyperlipidemia     takes Atorvastatin 3 times a week  . Shingles   . Anemia   . Allergy     Nasonex daily as needed  . Wears glasses   . PONV (postoperative nausea and vomiting)   . 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 S/P radiation therapy 07/18/2013-08/28/2013    1) Left Breast / 50 Gy in 25 fractions/ 2) Left Breast Boost / 10 Gy in 5 fractions  .  Hypertension     takes Cardura nightly and Verapamil daily  . History of shingles   . Sinus drainage     put on Levaquin yesterday if no better in 3 days will start prednisone  . Headache(784.0)     rare  . Weakness     tingling and numbness both hands and left leg  . Joint pain    . Chronic back pain     cyst sitting on L4-5;slipped disc  . GERD (gastroesophageal reflux disease)     takes Omeprazole daily  . History of gastric ulcer at age 1  . Diverticulosis   . Depression     takes Cymbalta daily  . Insomnia     takes Restoril nightly    PAST SURGICAL HISTORY: Past Surgical History  Procedure Laterality Date  . Cataract extraction bilateral w/ anterior vitrectomy Bilateral   . Abdominal hysterectomy    . Cholecystectomy    . Knee arthroscopy Bilateral   . Tonsillectomy    . Colonoscopy  2014  . Upper gi endoscopy    . Breast surgery      lt lump-neg  . Dupuytren / palmar fasciotomy Bilateral 2007    x 3 to left and once to the right   . Tubal ligation    . Foot surgery Right   . Epidural injections      x 3  . Lumbar fusion  10/2013    Dr. Lynann [name]    FAMILY HISTORY Family History  Problem Relation Age of Onset  . Colon polyps Neg Hx   . Esophageal cancer Neg Hx   . Rectal cancer Neg Hx   . Stomach cancer Neg Hx   . Heart attack Mother   . Stroke Mother   . COPD Father   . Hypertension Father    the patient's father died at the age of 17 from uremia. The patient's mother died at the age of 45 from a heart attack. The patient has one brother, 5 sisters. One of her sisters was diagnosed with breast cancer in her 58s. There is no history of ovarian cancer in the family  GYNECOLOGIC HISTORY:  No LMP recorded. Patient has had a hysterectomy. Menarche age 16, first live birth age 82. The patient is GX P1. She had a hysterectomy with bilateral salpingo-oophorectomy in her 75s and receive hormone replacement for approximately 30 years after that.  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 Her husband died a few years ago with severe respiratory complications. He had a tracheostomy. She was his primary caregiver. She now lives alone with her femal Educational psychologist. Her son [name] [name] lives in [place]. He does O. designed for [employer]. The patient has one biological and 3 stepgrandchildren. She is a [employer]   ADVANCED DIRECTIVES: Not in place; at the 01/13/2014 visit the patient received the appropriate documents to declare healthcare power of attorney.   HEALTH MAINTENANCE: History  Substance Use Topics  . Smoking status: Never Smoker   . Smokeless tobacco: Never Used  . Alcohol Use: Yes     Comment: wine occasionally-rarely     Colonoscopy:  PAP:  Bone density: 01/07/2014, normal  Lipid panel:  Allergies  Allergen Reactions  . Requip [Ropinirole Hcl] Shortness Of Breath and Nausea And Vomiting  . Codeine Other (See Comments)    fatigue  . Minocycline Hives  . Morphine Sulfate Other (See Comments)    Palpitations  .  Tetracyclines & Related Hives    Pt doesn't remember a reaction  . Zestril [Lisinopril] Cough  . Zetia [Ezetimibe] Other (See Comments)    Muscle and joint pain  . Amoxicillin Rash    Pt doesn't remember a reaction  . Sulfa Antibiotics Other (See Comments)    Headache (pt states that a blood pressure medicine caused a severe headache but doesn't remember a reaction to sulfa)    Current Outpatient Prescriptions  Medication Sig Dispense Refill  . anastrozole (ARIMIDEX) 1 MG tablet Take 1 tablet (1 mg total) by mouth daily. 90 tablet 3  . atorvastatin (LIPITOR) 80 MG tablet Take 1 tablet (80 mg total) by mouth daily. 90 tablet 3  . Calcium Carbonate-Vitamin D (CALTRATE 600+D) 600-400 MG-UNIT per chew tablet Chew 1 tablet by mouth daily. 180 tablet 4  . Cholecalciferol (VITAMIN D) 2000 UNITS tablet Take 4,000 Units by mouth daily.    Marland Kitchen doxazosin (CARDURA) 8 MG tablet Take 4 mg by mouth at bedtime.    . DULoxetine (CYMBALTA) 30 MG capsule TAKE 1 CAPSULE BY MOUTH 3 TIMES DAILY 270 capsule 3  . gabapentin (NEURONTIN) 300 MG capsule Take by mouth at bedtime.  2  . levothyroxine (SYNTHROID) 50 MCG tablet 1/2-1 daily for thyroid, 30-60 mins on  an empty stomach in the morning. (Patient taking differently: 50 mcg. 1/2-1 daily for thyroid, 30-60 mins on an empty stomach in the morning.) 30 tablet 1  . meloxicam (MOBIC) 15 MG tablet TAKE 1/2 TO 1 TABLET DAILY WITH FOOR FOR PAIN *DONT TAKE WITH ALEVE OR IBUPROFEN CAN TAKE TYLENOL* 90 tablet 1  . omeprazole (PRILOSEC) 20 MG capsule TAKE ONE CAPSULE BY MOUTH EVERY DAY 90 capsule 99  . ranitidine (ZANTAC) 300 MG tablet Take twice a day while trying to get off PPI, then can go to once at night 60 tablet 3  . temazepam (RESTORIL) 30 MG capsule Take 1 capsule (30 mg total) by mouth at bedtime. 90 capsule 1  . terbinafine (LAMISIL) 250 MG tablet Take 1 tablet (250 mg total) by mouth daily. 90 tablet 0  . traMADol (ULTRAM) 50 MG tablet Take 1 tablet (50 mg total) by mouth 4 (four) times daily as needed for moderate pain (headaches). 120 tablet 0  . verapamil (CALAN-SR) 240 MG CR tablet TAKE 1 TABLET BY MOUTH EVERY DAY WITH FOOD 90 tablet 1  . mometasone (NASONEX) 50 MCG/ACT nasal spray Place 1 spray into both nostrils 2 (two) times daily as needed (sinusitis).     No current facility-administered medications for this visit.    OBJECTIVE: Middle-aged white woman in no acute distress Filed Vitals:   07/14/14 1128  BP: 146/79  Pulse: 100  Temp: 98.4 F (36.9 C)  Resp: 18     Body mass index is 36.92 kg/(m^2).    ECOG FS:2 - Symptomatic, <50% confined to bed  Skin: warm, dry  HEENT: sclerae anicteric, conjunctivae pink, oropharynx clear. No thrush or mucositis.  Lymph Nodes: No cervical or supraclavicular lymphadenopathy  Lungs: clear to auscultation bilaterally, no rales, wheezes, or rhonci  Heart: regular rate and rhythm  Abdomen: round, soft, non tender, positive bowel sounds  Musculoskeletal: No focal spinal tenderness, no peripheral edema  Neuro: non focal, well oriented, positive affect  Breasts: left breast status post lumpectomy and radiation. Palpable scar tissue to upper inner  quadrant along incision line. No evidence of recurrent disease. Left axilla benign. Right breast unremarkable.   LAB RESULTS:  CMP  Component Value Date/Time   NA 140 07/07/2014 1152   NA 137 06/16/2014 1511   K 3.9 07/07/2014 1152   K 4.1 06/16/2014 1511   CL 99 06/16/2014 1511   CO2 25 07/07/2014 1152   CO2 27 06/16/2014 1511   GLUCOSE 133 07/07/2014 1152   GLUCOSE 75 06/16/2014 1511   BUN 16.4 07/07/2014 1152   BUN 26* 06/16/2014 1511   CREATININE 0.8 07/07/2014 1152   CREATININE 0.85 06/16/2014 1511   CREATININE 0.68 10/20/2013 0915   CALCIUM 9.3 07/07/2014 1152   CALCIUM 9.5 06/16/2014 1511   PROT 7.1 07/07/2014 1152   PROT 7.1 06/16/2014 1511   ALBUMIN 3.5 07/07/2014 1152   ALBUMIN 4.3 06/16/2014 1511   AST 11 07/07/2014 1152   AST 16 06/16/2014 1511   ALT 15 07/07/2014 1152   ALT 12 06/16/2014 1511   ALKPHOS 118 07/07/2014 1152   ALKPHOS 109 06/16/2014 1511   BILITOT 0.35 07/07/2014 1152   BILITOT 0.3 06/16/2014 1511   GFRNONAA 71 06/16/2014 1511   GFRNONAA 89* 10/20/2013 0915   GFRAA 81 06/16/2014 1511   GFRAA >90 10/20/2013 0915    I No results found for: SPEP  Lab Results  Component Value Date   WBC 6.2 07/07/2014   NEUTROABS 4.3 07/07/2014   HGB 11.7 07/07/2014   HCT 35.9 07/07/2014   MCV 87.6 07/07/2014   PLT 273 07/07/2014      Chemistry      Component Value Date/Time   NA 140 07/07/2014 1152   NA 137 06/16/2014 1511   K 3.9 07/07/2014 1152   K 4.1 06/16/2014 1511   CL 99 06/16/2014 1511   CO2 25 07/07/2014 1152   CO2 27 06/16/2014 1511   BUN 16.4 07/07/2014 1152   BUN 26* 06/16/2014 1511   CREATININE 0.8 07/07/2014 1152   CREATININE 0.85 06/16/2014 1511   CREATININE 0.68 10/20/2013 0915      Component Value Date/Time   CALCIUM 9.3 07/07/2014 1152   CALCIUM 9.5 06/16/2014 1511   ALKPHOS 118 07/07/2014 1152   ALKPHOS 109 06/16/2014 1511   AST 11 07/07/2014 1152   AST 16 06/16/2014 1511   ALT 15 07/07/2014 1152   ALT 12  06/16/2014 1511   BILITOT 0.35 07/07/2014 1152   BILITOT 0.3 06/16/2014 1511       Lab Results  Component Value Date   LABCA2 30 05/31/2013    No components found for: [id number]  No results for input(s): INR in the last 168 hours.  Urinalysis    Component Value Date/Time   COLORURINE YELLOW 06/16/2014 1523   APPEARANCEUR CLEAR 06/16/2014 1523   LABSPEC 1.013 06/16/2014 1523   PHURINE 5.0 06/16/2014 1523   GLUCOSEU NEG 06/16/2014 1523   HGBUR NEG 06/16/2014 1523   BILIRUBINUR NEG 06/16/2014 1523   KETONESUR NEG 06/16/2014 1523   PROTEINUR NEG 06/16/2014 1523   UROBILINOGEN 0.2 06/16/2014 1523   NITRITE NEG 06/16/2014 1523   LEUKOCYTESUR NEG 06/16/2014 1523    STUDIES: .No results found. Most recent mammogram on 05/20/14 was unremarkable Most recent bone density scan on 01/08/15 showed a t-score of 0.9 (normal)  ASSESSMENT: 69 y.o. [place] woman  (1) status post left lumpectomy and sentinel lymph node sampling 06/06/2013 for a pT1c pN0, stage IA invasive lobular carcinoma, grade 1, estrogen receptor and progesterone receptor strongly positive, with no HER-2 amplification, and an MIB-1 of 14%.  (2) Oncotype DX score of 11 predicted a 10 year risk of outside the breast  recurrence of 8% of the patient's only local treatment was tamoxifen for 5 years. It also predicted no benefit from chemotherapy.  (3) adjuvant radiation to the left breast completed 08/28/2013.  (4) started anastrozole June 20 15; bone density scan September 2015 was normal (T score positive)  (5) status post back injury requiring posterior lumbar fusion at the L4-5 level  (6) status post remote hysterectomy with bilateral salpingo-oophorectomy  PLAN: Amberleigh is doing well today. The labs were reviewed in detail and were entirely normal. She is tolerating the anastrozole well and the goal is to continue until June 2020 to complete 5 years of antiestrogen therapy.   She will return in 6 months for labs  and a follow up visit. She understands and agrees with this plan. She knows the goal of treatment in her case is cure. She has been encouraged to call with any issues that might arise before her next visit here.   Laurie Panda, NP   07/14/2014 11:57 AM

## 2014-07-14 NOTE — Telephone Encounter (Signed)
Appointments made and avs printed for patient °

## 2014-07-24 ENCOUNTER — Other Ambulatory Visit: Payer: Self-pay | Admitting: Emergency Medicine

## 2014-07-28 ENCOUNTER — Encounter: Payer: Self-pay | Admitting: Physician Assistant

## 2014-07-28 ENCOUNTER — Ambulatory Visit (INDEPENDENT_AMBULATORY_CARE_PROVIDER_SITE_OTHER): Payer: Medicare Other | Admitting: Physician Assistant

## 2014-07-28 VITALS — BP 146/80 | HR 102 | Temp 98.4°F | Resp 16 | Ht 65.5 in | Wt 226.0 lb

## 2014-07-28 DIAGNOSIS — D239 Other benign neoplasm of skin, unspecified: Secondary | ICD-10-CM | POA: Diagnosis not present

## 2014-07-28 DIAGNOSIS — L814 Other melanin hyperpigmentation: Secondary | ICD-10-CM | POA: Diagnosis not present

## 2014-07-28 DIAGNOSIS — B351 Tinea unguium: Secondary | ICD-10-CM

## 2014-07-28 DIAGNOSIS — L918 Other hypertrophic disorders of the skin: Secondary | ICD-10-CM

## 2014-07-28 DIAGNOSIS — L821 Other seborrheic keratosis: Secondary | ICD-10-CM | POA: Diagnosis not present

## 2014-07-28 DIAGNOSIS — D229 Melanocytic nevi, unspecified: Secondary | ICD-10-CM

## 2014-07-28 LAB — HEPATIC FUNCTION PANEL
ALBUMIN: 4 g/dL (ref 3.5–5.2)
ALT: 12 U/L (ref 0–35)
AST: 13 U/L (ref 0–37)
Alkaline Phosphatase: 109 U/L (ref 39–117)
Bilirubin, Direct: 0.1 mg/dL (ref 0.0–0.3)
Indirect Bilirubin: 0.2 mg/dL (ref 0.2–1.2)
TOTAL PROTEIN: 6.6 g/dL (ref 6.0–8.3)
Total Bilirubin: 0.3 mg/dL (ref 0.2–1.2)

## 2014-07-28 NOTE — Patient Instructions (Addendum)
Wound care HOME CARE INSTRUCTIONS   Keep the wound site dry and clean. Do not soak it in water.  If you still have a bandage (dressing), change it at least once a day or as directed by your health care provider. If the dressing sticks, pour warm, sterile water over it until it loosens and can be removed without pulling apart the wound edges. Pat dry with a clean towel.  Apply cream or ointment that stops the growth of bacteria (antibacterial cream or ointment) only if your health care provider has directed you to do so. Place a nonstick bandage over the wound to prevent the dressing from sticking.  Cover the nonstick bandage with a new dressing as directed by your health care provider.  If the bandage becomes wet, dirty, or develops a bad smell, change it as soon as possible.  New scars become sunburned easily. Use sunscreens with a sun protection factor (SPF) of at least 15 when out in the sun. Reapply the SPF every 2 hours.  Only take medicines as directed by your health care provider. SEEK IMMEDIATE MEDICAL CARE IF:   You have redness, swelling, or increasing pain in the wound.  You have pus coming from the wound.  You have a fever.  You notice a bad smell coming from the wound or dressing.  Your wound edges open up after staples have been removed. MAKE SURE YOU:   Understand these instructions.  Will watch your condition.  Will get help right away if you are not doing well or get worse. Document Released: 03/10/2008 Document Revised: 04/02/2013 Document Reviewed: 03/10/2008 [facility] Patient Information 2015 [place], [place]. This information is not intended to replace advice given to you by your health care provider. Make sure you discuss any questions you have with your health care provider.  Mole Excision Your caregiver has removed (excised) a mole. Most moles are benign (non cancerous). Some moles may change over time and require biopsy (tissue sample) or removal. The mole  usually is removed by shaving or cutting it from the skin. You will have stitches in your skin if the mole is large. A small mole, or one that is shaved off, may require only a small bandage. Your caregiver will send a piece of the mole to the laboratory (pathology) to examine it under a microscope for signs of cancer. Make sure you get your biopsy results when you return for your follow-up visit. Call if there is no return visit. HOME CARE INSTRUCTIONS   If the biopsied area was the arm or leg, keep it raised (above the level of your heart) to decrease pain and swelling, if you are having any.  Keep the wound and dressing clean and dry. Clean as necessary.  If the dressing gets wet, remove it slowly and carefully. If it sticks, use warm, soapy water to gently loosen it. Pat the area dry with a clean towel before putting on another dressing.  Return in 7 days or as directed to have your sutures (stitches) removed.  Call in 3 to 4 days, or as directed, for the results of your biopsy. SEEK IMMEDIATE MEDICAL CARE IF:   You have a fever.  You have excess blood soaking through the dressing.  You have increasing pain and swelling in the wound.  You have numbness or swelling below the wound.  You have redness, swelling, pus, a bad smell, or red streaks coming away from the wound, or any other signs of infection. MAKE SURE YOU:  Understand these instructions.  Will watch your condition.  Will get help right away if you are not doing well or get worse. Document Released: 03/25/2000 Document Revised: 06/20/2011 Document Reviewed: 02/28/2007 [facility] Patient Information 2015 [place], [place]. This information is not intended to replace advice given to you by your health care provider. Make sure you discuss any questions you have with your health care provider.

## 2014-07-28 NOTE — Progress Notes (Signed)
  Subjective:     [name] [name] is a 69 y.o. female who complains of skin tags. The patient wishes skin tag removed as the lesion is getting caught on clothing and/or jewelry and is recurrently irritated. And has atypical nevus, left front chest, irreg border with multicolor, measures 4x4 mm.   In addition last visit she was given lamisil for toenail fungus and needs liver function tested.   The following portions of the patient's history were reviewed and updated as appropriate: allergies, current medications, past family history, past medical history, past social history, past surgical history and problem list.  Review of Systems Pertinent items are noted in HPI.    Objective:    Skin:  2 skins tag in the left neck region, were removed using scissors and forceps after alcohol prep; hemostasis is obtained with pressure and discarded.   Left chest area with  atypical nevus, left front chest, irreg border with multicolor, measures 4x4 mm. Area cleaned with alcohol, marcaine used to numb the area, an elliptical incision was made and 3 sutures 4.0 nylon placed. Tolerated well.     Assessment:    Chronically irritated skin tag, removed.   onychomycosis of toenails Atypical mole rule out atypia   Plan:     1. The patient is instructed to watch for signs of infection including erythema, pain, purulent discharge, or crusting.  2. Written patient instruction given. 3. Follow up as needed for acute illness.   4. Check LFTs since on lamisil 5. Mole sent for removal

## 2014-07-31 ENCOUNTER — Other Ambulatory Visit: Payer: Self-pay | Admitting: Internal Medicine

## 2014-07-31 DIAGNOSIS — M1711 Unilateral primary osteoarthritis, right knee: Secondary | ICD-10-CM | POA: Diagnosis not present

## 2014-08-04 ENCOUNTER — Ambulatory Visit: Payer: Self-pay | Admitting: Physician Assistant

## 2014-08-05 ENCOUNTER — Ambulatory Visit: Payer: Medicare Other | Admitting: Physician Assistant

## 2014-08-05 ENCOUNTER — Encounter: Payer: Self-pay | Admitting: Physician Assistant

## 2014-08-05 VITALS — BP 128/78 | HR 88 | Temp 97.7°F | Resp 16 | Ht 65.5 in | Wt 229.0 lb

## 2014-08-05 DIAGNOSIS — Z4802 Encounter for removal of sutures: Secondary | ICD-10-CM

## 2014-08-05 NOTE — Progress Notes (Signed)
Subjective:    [name] [name] is a 69 y.o. female who had a mole removal on 07/28/2014.  She denies pain, redness, or drainage from the wound.   The following portions of the patient's history were reviewed and updated as appropriate: allergies, current medications, past family history, past medical history, past social history, past surgical history and problem list.  Review of Systems Pertinent items are noted in HPI.    Objective:    BP 128/78 mmHg  Pulse 88  Temp(Src) 97.7 F (36.5 C)  Resp 16  Ht 5' 5.5" (1.664 m)  Wt 229 lb (103.874 kg)  BMI 37.51 kg/m2 Injury exam: left upper chest is healing well, without evidence of infection.    Assessment:    Laceration is healing well, without evidence of infection.    Plan:     1. 3 sutures were removed. 2. Wound care discussed. 3. Follow up as needed.

## 2014-08-11 ENCOUNTER — Other Ambulatory Visit: Payer: Self-pay | Admitting: Physician Assistant

## 2014-09-22 ENCOUNTER — Encounter: Payer: Self-pay | Admitting: Internal Medicine

## 2014-09-22 ENCOUNTER — Ambulatory Visit (INDEPENDENT_AMBULATORY_CARE_PROVIDER_SITE_OTHER): Payer: Medicare Other | Admitting: Internal Medicine

## 2014-09-22 VITALS — BP 126/68 | HR 76 | Temp 97.3°F | Resp 16 | Ht 65.5 in | Wt 229.6 lb

## 2014-09-22 DIAGNOSIS — R7309 Other abnormal glucose: Secondary | ICD-10-CM | POA: Diagnosis not present

## 2014-09-22 DIAGNOSIS — I10 Essential (primary) hypertension: Secondary | ICD-10-CM | POA: Diagnosis not present

## 2014-09-22 DIAGNOSIS — E559 Vitamin D deficiency, unspecified: Secondary | ICD-10-CM

## 2014-09-22 DIAGNOSIS — K219 Gastro-esophageal reflux disease without esophagitis: Secondary | ICD-10-CM

## 2014-09-22 DIAGNOSIS — E782 Mixed hyperlipidemia: Secondary | ICD-10-CM

## 2014-09-22 DIAGNOSIS — Z79899 Other long term (current) drug therapy: Secondary | ICD-10-CM

## 2014-09-22 DIAGNOSIS — R7303 Prediabetes: Secondary | ICD-10-CM

## 2014-09-22 LAB — TSH: TSH: 0.766 u[IU]/mL (ref 0.350–4.500)

## 2014-09-22 LAB — CBC WITH DIFFERENTIAL/PLATELET
BASOS ABS: 0.1 10*3/uL (ref 0.0–0.1)
Basophils Relative: 1 % (ref 0–1)
Eosinophils Absolute: 0.2 10*3/uL (ref 0.0–0.7)
Eosinophils Relative: 2 % (ref 0–5)
HCT: 31.9 % — ABNORMAL LOW (ref 36.0–46.0)
HEMOGLOBIN: 10.5 g/dL — AB (ref 12.0–15.0)
Lymphocytes Relative: 19 % (ref 12–46)
Lymphs Abs: 1.4 10*3/uL (ref 0.7–4.0)
MCH: 28.3 pg (ref 26.0–34.0)
MCHC: 32.9 g/dL (ref 30.0–36.0)
MCV: 86 fL (ref 78.0–100.0)
MPV: 10.8 fL (ref 8.6–12.4)
Monocytes Absolute: 0.6 10*3/uL (ref 0.1–1.0)
Monocytes Relative: 8 % (ref 3–12)
NEUTROS ABS: 5.3 10*3/uL (ref 1.7–7.7)
Neutrophils Relative %: 70 % (ref 43–77)
PLATELETS: 291 10*3/uL (ref 150–400)
RBC: 3.71 MIL/uL — AB (ref 3.87–5.11)
RDW: 14.8 % (ref 11.5–15.5)
WBC: 7.6 10*3/uL (ref 4.0–10.5)

## 2014-09-22 LAB — BASIC METABOLIC PANEL WITH GFR
BUN: 19 mg/dL (ref 6–23)
CALCIUM: 9.2 mg/dL (ref 8.4–10.5)
CO2: 25 meq/L (ref 19–32)
CREATININE: 0.84 mg/dL (ref 0.50–1.10)
Chloride: 103 mEq/L (ref 96–112)
GFR, EST AFRICAN AMERICAN: 83 mL/min
GFR, Est Non African American: 72 mL/min
Glucose, Bld: 106 mg/dL — ABNORMAL HIGH (ref 70–99)
Potassium: 3.8 mEq/L (ref 3.5–5.3)
Sodium: 137 mEq/L (ref 135–145)

## 2014-09-22 LAB — HEPATIC FUNCTION PANEL
ALBUMIN: 4 g/dL (ref 3.5–5.2)
ALK PHOS: 101 U/L (ref 39–117)
ALT: 10 U/L (ref 0–35)
AST: 13 U/L (ref 0–37)
BILIRUBIN DIRECT: 0.1 mg/dL (ref 0.0–0.3)
BILIRUBIN TOTAL: 0.2 mg/dL (ref 0.2–1.2)
Indirect Bilirubin: 0.1 mg/dL — ABNORMAL LOW (ref 0.2–1.2)
Total Protein: 6.8 g/dL (ref 6.0–8.3)

## 2014-09-22 LAB — LIPID PANEL
CHOLESTEROL: 163 mg/dL (ref 0–200)
HDL: 46 mg/dL (ref >=46)
LDL Cholesterol: 75 mg/dL (ref 0–99)
Total CHOL/HDL Ratio: 3.5 Ratio
Triglycerides: 212 mg/dL — ABNORMAL HIGH (ref <150)
VLDL: 42 mg/dL — ABNORMAL HIGH (ref 0–40)

## 2014-09-22 LAB — MAGNESIUM: Magnesium: 1.8 mg/dL (ref 1.5–2.5)

## 2014-09-22 LAB — HEMOGLOBIN A1C
HEMOGLOBIN A1C: 6.2 % — AB (ref <5.7)
Mean Plasma Glucose: 131 mg/dL — ABNORMAL HIGH (ref <117)

## 2014-09-22 NOTE — Progress Notes (Signed)
Patient ID: [name] [name], female   DOB: 1946-02-13, 69 y.o.   MRN: [id number]   This very nice 69 y.o. MWF  presents for 3 month follow up with Hypertension, Hyperlipidemia, Pre-Diabetes and Vitamin D Deficiency.    Patient is treated for HTN & BP has been controlled at home. Today's BP: 126/68 mmHg. Patient has had no complaints of any cardiac type chest pain, palpitations, dyspnea/orthopnea/PND, dizziness, claudication, or dependent edema.   Hyperlipidemia is controlled with diet & meds. Patient denies myalgias or other med SE's. Last Lipids were at goal - Chol 168; HDL 49; LDL 89; Trig 151 on 06/16/2014.    Also, the patient has history of Morbid Obesity (BMI 37.61) and consequent PreDiabetes and has had no symptoms of reactive hypoglycemia, diabetic polys, paresthesias or visual blurring.  Last A1c was  Not at goal with elevated A1c of 6.3% on 06/16/2014.    Further, the patient also has history of Vitamin D Deficiency and supplements vitamin D without any suspected side-effects. Last vitamin D was 37 on 06/16/2014.  Medication Sig  . anastrozole  1 MG tablet Take 1 tablet (1 mg total) by mouth daily.  Marland Kitchen atorvastatin  80 MG tablet Take 1 tablet (80 mg total) by mouth daily.  Marland Kitchen CALTRATE 600+D 600-400  Chew 1 tablet by mouth daily.  . Cholecalciferol (2000 UNITS tablet Take 4,000 Units by mouth daily.  Marland Kitchen doxazosin 8 MG tablet TAKE 1/2 TABLET(4MG ) BY MOUTH EVERY DAY  . DULoxetine 30 MG capsule TAKE 1 CAPSULE BY MOUTH 3 TIMES DAILY  . gabapentin  300 MG capsule Take by mouth at bedtime.  Marland Kitchen levothyroxine  50 MCG tablet 1/2-1 DAILY FOR THYROID, 30-60 MINS ON AN EMPTY STOMACH IN THE MORNING.  . meloxicam  15 MG tablet TAKE 1/2 TO 1 TABLET DAILY   . mometasone  50 MCG/ACT nasal spray Place 1 spray into both nostrils 2 (two) times daily as needed (sinusitis).  Marland Kitchen omeprazole  20 MG capsule TAKE ONE CAPSULE BY MOUTH EVERY DAY  . ranitidine (ZANTAC) 300 MG tablet Take twice a day while trying to get off  PPI, then can go to once at night  . temazepam (RESTORIL) 30 MG capsule TAKE 1 CAPSULE BY MOUTH AT BEDTIME  . terbinafine (LAMISIL) 250 MG tablet Take 1 tablet (250 mg total) by mouth daily.  . traMADol (ULTRAM) 50 MG tablet Take 1 tablet times daily as needed for moderate pain  . verapamil (CALAN-SR) 240 MG CR tablet TAKE 1 TABLET BY MOUTH EVERY DAY WITH FOOD   Allergies  Allergen Reactions  . Requip [Ropinirole Hcl] Shortness Of Breath and Nausea And Vomiting  . Codeine Other (See Comments)    fatigue  . Minocycline Hives  . Morphine Sulfate Other (See Comments)    Palpitations  . Tetracyclines & Related Hives    Pt doesn't remember a reaction  . Zestril [Lisinopril] Cough  . Zetia [Ezetimibe] Other (See Comments)    Muscle and joint pain  . Amoxicillin Rash    Pt doesn't remember a reaction  . Sulfa Antibiotics Other (See Comments)    Headache (pt states that a blood pressure medicine caused a severe headache but doesn't remember a reaction to sulfa)   PMHx:   Past Medical History  Diagnosis Date  . Arthritis   . Anxiety   . Hyperlipidemia     takes Atorvastatin 3 times a week  . Shingles   . Anemia   . Allergy  Nasonex daily as needed  . Wears glasses   . PONV (postoperative nausea and vomiting)   . 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 S/P radiation therapy 07/18/2013-08/28/2013    1) Left Breast / 50 Gy in 25 fractions/ 2) Left Breast Boost / 10 Gy in 5 fractions  . Hypertension     takes Cardura nightly and Verapamil daily  . History of shingles   . Sinus drainage     put on Levaquin yesterday if no better in 3 days will start prednisone  . Headache(784.0)     rare  . Weakness     tingling and numbness both hands and left leg  . Joint pain   . Chronic back pain     cyst sitting on L4-5;slipped disc  . GERD (gastroesophageal reflux disease)     takes Omeprazole daily  . History of gastric ulcer at age 34  .  Diverticulosis   . Depression     takes Cymbalta daily  . Insomnia     takes Restoril nightly   Immunization History  Administered Date(s) Administered  . Influenza Whole 01/01/2013  . Influenza, High Dose Seasonal PF 01/29/2014  . Pneumococcal Conjugate-13 06/16/2014  . Pneumococcal Polysaccharide-23 05/23/2011  . Tdap 03/11/2008  . Zoster 02/02/2010   Past Surgical History  Procedure Laterality Date  . Cataract extraction bilateral w/ anterior vitrectomy Bilateral   . Abdominal hysterectomy    . Cholecystectomy    . Knee arthroscopy Bilateral   . Tonsillectomy    . Colonoscopy  2014  . Upper gi endoscopy    . Breast surgery      lt lump-neg  . Dupuytren / palmar fasciotomy Bilateral 2007    x 3 to left and once to the right   . Tubal ligation    . Foot surgery Right   . Epidural injections      x 3  . Lumbar fusion  10/2013    Dr. Lynann Bologna   FHx:    Reviewed / unchanged  SHx:    Reviewed / unchanged  Systems Review:  Constitutional: Denies fever, chills, wt changes, headaches, insomnia, fatigue, night sweats, change in appetite. Eyes: Denies redness, blurred vision, diplopia, discharge, itchy, watery eyes.  ENT: Denies discharge, congestion, post nasal drip, epistaxis, sore throat, earache, hearing loss, dental pain, tinnitus, vertigo, sinus pain, snoring.  CV: Denies chest pain, palpitations, irregular heartbeat, syncope, dyspnea, diaphoresis, orthopnea, PND, claudication or edema. Respiratory: denies cough, dyspnea, DOE, pleurisy, hoarseness, laryngitis, wheezing.  Gastrointestinal: Denies dysphagia, odynophagia, heartburn, reflux, water brash, abdominal pain or cramps, nausea, vomiting, bloating, diarrhea, constipation, hematemesis, melena, hematochezia  or hemorrhoids. Genitourinary: Denies dysuria, frequency, urgency, nocturia, hesitancy, discharge, hematuria or flank pain. Musculoskeletal: Denies arthralgias, myalgias, stiffness, jt. swelling, pain, limping or  strain/sprain.  Skin: Denies pruritus, rash, hives, warts, acne, eczema or change in skin lesion(s). Neuro: No weakness, tremor, incoordination, spasms, paresthesia or pain. Psychiatric: Denies confusion, memory loss or sensory loss. Endo: Denies change in weight, skin or hair change.  Heme/Lymph: No excessive bleeding, bruising or enlarged lymph nodes.  Physical Exam  BP 126/68   Pulse 76  Temp 97.3 F Resp 16  Ht 5' 5.5" ( Wt 229 lb 9.6 oz     BMI 37.61   Appears well nourished and in no distress. Eyes: PERRLA, EOMs, conjunctiva no swelling or erythema. Sinuses: No frontal/maxillary tenderness ENT/Mouth: EAC's clear, TM's nl w/o erythema, bulging. Nares  clear w/o erythema, swelling, exudates. Oropharynx clear without erythema or exudates. Oral hygiene is good. Tongue normal, non obstructing. Hearing intact.  Neck: Supple. Thyroid nl. Car 2+/2+ without bruits, nodes or JVD. Chest: Respirations nl with BS clear & equal w/o rales, rhonchi, wheezing or stridor.  Cor: Heart sounds normal w/ regular rate and rhythm without sig. murmurs, gallops, clicks, or rubs. Peripheral pulses normal and equal  without edema.  Abdomen: Soft & bowel sounds normal. Non-tender w/o guarding, rebound, hernias, masses, or organomegaly.  Lymphatics: Unremarkable.  Musculoskeletal: Full ROM all peripheral extremities, joint stability, 5/5 strength, and normal gait.  Skin: Warm, dry without exposed rashes, lesions or ecchymosis apparent.  Neuro: Cranial nerves intact, reflexes equal bilaterally. Sensory-motor testing grossly intact. Tendon reflexes grossly intact.  Pysch: Alert & oriented x 3.  Insight and judgement nl & appropriate. No ideations.  Assessment and Plan:  1. Essential hypertension  - TSH  2. Hyperlipidemia  - Lipid panel  3. Prediabetes  - Hemoglobin A1c - Insulin, random  4. Vitamin D deficiency  - Vit D  25 hydroxy (rtn osteoporosis monitoring)  5. Gastroesophageal reflux  disease, esophagitis presence not specified   6. Medication management  - CBC with Differential/Platelet - BASIC METABOLIC PANEL WITH GFR - Hepatic function panel - Magnesium   Recommended regular exercise, BP monitoring, weight control, and discussed med and SE's. Recommended labs to assess and monitor clinical status. Further disposition pending results of labs. Over 30 minutes of exam, counseling, chart review was performed

## 2014-09-22 NOTE — Patient Instructions (Signed)

## 2014-09-23 LAB — VITAMIN D 25 HYDROXY (VIT D DEFICIENCY, FRACTURES): Vit D, 25-Hydroxy: 31 ng/mL (ref 30–100)

## 2014-09-23 LAB — INSULIN, RANDOM: INSULIN: 69.1 u[IU]/mL — AB (ref 2.0–19.6)

## 2014-09-30 ENCOUNTER — Other Ambulatory Visit: Payer: Self-pay | Admitting: Internal Medicine

## 2014-10-04 ENCOUNTER — Other Ambulatory Visit: Payer: Self-pay | Admitting: Physician Assistant

## 2014-10-04 DIAGNOSIS — J302 Other seasonal allergic rhinitis: Secondary | ICD-10-CM

## 2014-10-05 ENCOUNTER — Other Ambulatory Visit: Payer: Self-pay | Admitting: Physician Assistant

## 2014-10-22 ENCOUNTER — Other Ambulatory Visit: Payer: Self-pay | Admitting: Physician Assistant

## 2014-11-13 ENCOUNTER — Other Ambulatory Visit: Payer: Self-pay | Admitting: Physician Assistant

## 2014-11-14 NOTE — Telephone Encounter (Signed)
Called Rx into [facility] 

## 2014-12-25 DIAGNOSIS — M1712 Unilateral primary osteoarthritis, left knee: Secondary | ICD-10-CM | POA: Diagnosis not present

## 2014-12-30 ENCOUNTER — Encounter: Payer: Self-pay | Admitting: Internal Medicine

## 2014-12-30 ENCOUNTER — Ambulatory Visit (INDEPENDENT_AMBULATORY_CARE_PROVIDER_SITE_OTHER): Payer: Medicare Other | Admitting: Internal Medicine

## 2014-12-30 ENCOUNTER — Other Ambulatory Visit: Payer: Self-pay | Admitting: Internal Medicine

## 2014-12-30 VITALS — BP 124/60 | HR 98 | Temp 97.8°F | Resp 16 | Ht 65.5 in | Wt 231.0 lb

## 2014-12-30 DIAGNOSIS — Z23 Encounter for immunization: Secondary | ICD-10-CM

## 2014-12-30 DIAGNOSIS — K219 Gastro-esophageal reflux disease without esophagitis: Secondary | ICD-10-CM

## 2014-12-30 DIAGNOSIS — E559 Vitamin D deficiency, unspecified: Secondary | ICD-10-CM

## 2014-12-30 DIAGNOSIS — Z79899 Other long term (current) drug therapy: Secondary | ICD-10-CM | POA: Diagnosis not present

## 2014-12-30 DIAGNOSIS — R7309 Other abnormal glucose: Secondary | ICD-10-CM | POA: Diagnosis not present

## 2014-12-30 DIAGNOSIS — R7303 Prediabetes: Secondary | ICD-10-CM

## 2014-12-30 DIAGNOSIS — E782 Mixed hyperlipidemia: Secondary | ICD-10-CM | POA: Diagnosis not present

## 2014-12-30 DIAGNOSIS — I10 Essential (primary) hypertension: Secondary | ICD-10-CM

## 2014-12-30 LAB — BASIC METABOLIC PANEL WITH GFR
BUN: 20 mg/dL (ref 7–25)
CALCIUM: 9.1 mg/dL (ref 8.6–10.4)
CO2: 26 mmol/L (ref 20–31)
Chloride: 103 mmol/L (ref 98–110)
Creat: 0.68 mg/dL (ref 0.50–0.99)
GFR, Est Non African American: >89 mL/min (ref >=60)
GLUCOSE: 81 mg/dL (ref 65–99)
Potassium: 4.2 mmol/L (ref 3.5–5.3)
Sodium: 137 mmol/L (ref 135–146)

## 2014-12-30 LAB — CBC WITH DIFFERENTIAL/PLATELET
Basophils Absolute: 0.1 10*3/uL (ref 0.0–0.1)
Basophils Relative: 1 % (ref 0–1)
EOS PCT: 2 % (ref 0–5)
Eosinophils Absolute: 0.2 10*3/uL (ref 0.0–0.7)
HEMATOCRIT: 31.7 % — AB (ref 36.0–46.0)
Hemoglobin: 10.6 g/dL — ABNORMAL LOW (ref 12.0–15.0)
LYMPHS ABS: 1.8 10*3/uL (ref 0.7–4.0)
LYMPHS PCT: 20 % (ref 12–46)
MCH: 28.8 pg (ref 26.0–34.0)
MCHC: 33.4 g/dL (ref 30.0–36.0)
MCV: 86.1 fL (ref 78.0–100.0)
MONO ABS: 0.9 10*3/uL (ref 0.1–1.0)
MPV: 10.2 fL (ref 8.6–12.4)
Monocytes Relative: 10 % (ref 3–12)
Neutro Abs: 6 10*3/uL (ref 1.7–7.7)
Neutrophils Relative %: 67 % (ref 43–77)
Platelets: 284 10*3/uL (ref 150–400)
RBC: 3.68 MIL/uL — ABNORMAL LOW (ref 3.87–5.11)
RDW: 14.9 % (ref 11.5–15.5)
WBC: 9 10*3/uL (ref 4.0–10.5)

## 2014-12-30 LAB — LIPID PANEL
CHOLESTEROL: 155 mg/dL (ref 125–200)
HDL: 47 mg/dL (ref >=46)
LDL CALC: 81 mg/dL (ref <130)
TRIGLYCERIDES: 137 mg/dL (ref <150)
Total CHOL/HDL Ratio: 3.3 Ratio (ref <=5.0)
VLDL: 27 mg/dL (ref <30)

## 2014-12-30 LAB — HEPATIC FUNCTION PANEL
ALBUMIN: 4 g/dL (ref 3.6–5.1)
ALK PHOS: 108 U/L (ref 33–130)
ALT: 12 U/L (ref 6–29)
AST: 13 U/L (ref 10–35)
Bilirubin, Direct: 0.1 mg/dL (ref <=0.2)
Indirect Bilirubin: 0.2 mg/dL (ref 0.2–1.2)
TOTAL PROTEIN: 6.6 g/dL (ref 6.1–8.1)
Total Bilirubin: 0.3 mg/dL (ref 0.2–1.2)

## 2014-12-30 LAB — MAGNESIUM: Magnesium: 2 mg/dL (ref 1.5–2.5)

## 2014-12-30 LAB — TSH: TSH: 1.232 u[IU]/mL (ref 0.350–4.500)

## 2014-12-30 MED ORDER — TEMAZEPAM 30 MG PO CAPS: 30.0000 mg | ORAL_CAPSULE | Freq: Every evening | ORAL | Status: DC | PRN

## 2014-12-30 MED ORDER — SUCRALFATE 1 G PO TABS: 1.0000 g | ORAL_TABLET | Freq: Four times a day (QID) | ORAL | Status: DC

## 2014-12-30 NOTE — Addendum Note (Signed)
Addended by: Starlyn Skeans A on: 12/30/2014 03:15 PM   Modules accepted: Orders

## 2014-12-30 NOTE — Addendum Note (Signed)
Addended by: HELMER, REBECCA A on: 12/30/2014 03:13 PM   Modules accepted: Orders

## 2014-12-30 NOTE — Progress Notes (Signed)
Patient ID: [name] [name], female   DOB: 08-04-1945, 69 y.o.   MRN: [id number]  Assessment and Plan:  Hypertension:  -Continue medication,  -monitor blood pressure at home.  -Continue DASH diet.   -Reminder to go to the ER if any CP, SOB, nausea, dizziness, severe HA, changes vision/speech, left arm numbness and tingling, and jaw pain.  Cholesterol: -Continue diet and exercise.  -Check cholesterol.   Pre-diabetes: -Continue diet and exercise.  -Check A1C  Vitamin D Def: -check level -continue medications.   Continue diet and meds as discussed. Further disposition pending results of labs.  HPI 69 y.o. female  presents for 3 month follow up with hypertension, hyperlipidemia, prediabetes and vitamin D.   Her blood pressure has been controlled at home, today their BP is BP: 124/60 mmHg.   She does not workout. She denies chest pain, shortness of breath, dizziness.     She is on cholesterol medication and denies myalgias. Her cholesterol is at goal. The cholesterol last visit was:   Lab Results  Component Value Date   CHOL 163 09/22/2014   HDL 46 09/22/2014   LDLCALC 75 09/22/2014   TRIG 212* 09/22/2014   CHOLHDL 3.5 09/22/2014     She has been working on diet and exercise for prediabetes, and denies foot ulcerations, hyperglycemia, hypoglycemia , increased appetite, nausea, paresthesia of the feet, polydipsia, polyuria, visual disturbances, vomiting and weight loss. Last A1C in the office was:  Lab Results  Component Value Date   HGBA1C 6.2* 09/22/2014    Patient is on Vitamin D supplement.  Lab Results  Component Value Date   VD25OH 31 09/22/2014      Current Medications:  Current Outpatient Prescriptions on File Prior to Visit  Medication Sig Dispense Refill  . anastrozole (ARIMIDEX) 1 MG tablet Take 1 tablet (1 mg total) by mouth daily. 90 tablet 3  . atorvastatin (LIPITOR) 80 MG tablet Take 1 tablet (80 mg total) by mouth daily. 90 tablet 3  . Calcium  Carbonate-Vitamin D (CALTRATE 600+D) 600-400 MG-UNIT per chew tablet Chew 1 tablet by mouth daily. 180 tablet 4  . Cholecalciferol (VITAMIN D) 2000 UNITS tablet Take 4,000 Units by mouth daily.    . cyclobenzaprine (FLEXERIL) 10 MG tablet Take 10 mg by mouth at bedtime.  1  . doxazosin (CARDURA) 8 MG tablet TAKE 1/2 TABLET(4MG ) BY MOUTH EVERY DAY 45 tablet 3  . DULoxetine (CYMBALTA) 30 MG capsule TAKE 1 CAPSULE BY MOUTH 3 TIMES DAILY 270 capsule 3  . gabapentin (NEURONTIN) 300 MG capsule TAKE 1 TABLET BY MOUTH EVERY DAY AT BEDTIME 90 capsule 1  . levothyroxine (SYNTHROID, LEVOTHROID) 50 MCG tablet 1/2-1 DAILY FOR THYROID, 30-60 MINS ON AN EMPTY STOMACH IN THE MORNING. 90 tablet 1  . meloxicam (MOBIC) 15 MG tablet TAKE 1/2 TO 1 TABLET DAILY WITH FOOR FOR PAIN *DONT TAKE WITH ALEVE OR IBUPROFEN CAN TAKE TYLENOL* 90 tablet 1  . mometasone (NASONEX) 50 MCG/ACT nasal spray USE 1 TO 2 SPRAYS IN EACH NOSTRIL TWICE DAILY 17 g 5  . ranitidine (ZANTAC) 300 MG tablet TAKE 1 TABLET BY MOUTH TWICE A DAY WHILE TRYING TO GET OFF PPI THEN CAN GO TO ONCE NIGHTLY 60 tablet 3  . traMADol (ULTRAM) 50 MG tablet TAKE 1 TABLET BY MOUTH 4 TIMES DAILY AS NEEDED 120 tablet 0  . verapamil (CALAN-SR) 240 MG CR tablet TAKE 1 TABLET BY MOUTH EVERY DAY WITH FOOD 90 tablet 1   No current facility-administered medications on  file prior to visit.    Medical History:  Past Medical History  Diagnosis Date  . Arthritis   . Anxiety   . Hyperlipidemia     takes Atorvastatin 3 times a week  . Shingles   . Anemia   . Allergy     Nasonex daily as needed  . Wears glasses   . PONV (postoperative nausea and vomiting)   . 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 S/P radiation therapy 07/18/2013-08/28/2013    1) Left Breast / 50 Gy in 25 fractions/ 2) Left Breast Boost / 10 Gy in 5 fractions  . Hypertension     takes Cardura nightly and Verapamil daily  . History of shingles   . Sinus  drainage     put on Levaquin yesterday if no better in 3 days will start prednisone  . Headache(784.0)     rare  . Weakness     tingling and numbness both hands and left leg  . Joint pain   . Chronic back pain     cyst sitting on L4-5;slipped disc  . GERD (gastroesophageal reflux disease)     takes Omeprazole daily  . History of gastric ulcer at age 30  . Diverticulosis   . Depression     takes Cymbalta daily  . Insomnia     takes Restoril nightly    Allergies:  Allergies  Allergen Reactions  . Requip [Ropinirole Hcl] Shortness Of Breath and Nausea And Vomiting  . Codeine Other (See Comments)    fatigue  . Minocycline Hives  . Morphine Sulfate Other (See Comments)    Palpitations  . Tetracyclines & Related Hives    Pt doesn't remember a reaction  . Zestril [Lisinopril] Cough  . Zetia [Ezetimibe] Other (See Comments)    Muscle and joint pain  . Amoxicillin Rash    Pt doesn't remember a reaction  . Sulfa Antibiotics Other (See Comments)    Headache (pt states that a blood pressure medicine caused a severe headache but doesn't remember a reaction to sulfa)     Review of Systems:  Review of Systems  Constitutional: Negative for fever, chills and malaise/fatigue.  HENT: Negative for congestion, ear pain and sore throat.   Respiratory: Negative for cough, shortness of breath and wheezing.   Cardiovascular: Negative for chest pain and palpitations.  Gastrointestinal: Negative for heartburn, diarrhea, constipation, blood in stool and melena.  Genitourinary: Negative.   Skin: Negative.   Neurological: Negative for dizziness, sensory change, loss of consciousness and headaches.  Psychiatric/Behavioral: Positive for depression. The patient is not nervous/anxious and does not have insomnia.     Family history- Review and unchanged  Social history- Review and unchanged  Physical Exam: BP 124/60 mmHg  Pulse 98  Temp(Src) 97.8 F (36.6 C) (Temporal)  Resp 16  Ht 5' 5.5"  (1.664 m)  Wt 231 lb (104.781 kg)  BMI 37.84 kg/m2 Wt Readings from Last 3 Encounters:  12/30/14 231 lb (104.781 kg)  09/22/14 229 lb 9.6 oz (104.146 kg)  08/05/14 229 lb (103.874 kg)    General Appearance: Well nourished well developed, in no apparent distress. Eyes: PERRLA, EOMs, conjunctiva no swelling or erythema ENT/Mouth: Ear canals normal without obstruction, swelling, erythma, discharge.  TMs normal bilaterally.  Oropharynx moist, clear, without exudate, or postoropharyngeal swelling. Neck: Supple, thyroid normal,no cervical adenopathy  Respiratory: Respiratory effort normal, Breath sounds clear A&P without rhonchi, wheeze, or rale.  No retractions, no accessory usage. Cardio: RRR with no MRGs. Brisk peripheral pulses without edema.  Abdomen: Soft, + BS,  Non tender, no guarding, rebound, hernias, masses. Musculoskeletal: Full ROM, 5/5 strength, Normal gait Skin: Warm, dry without rashes, lesions, ecchymosis.  Neuro: Awake and oriented X 3, Cranial nerves intact. Normal muscle tone, no cerebellar symptoms. Psych: Normal affect, Insight and Judgment appropriate.    Starlyn Skeans, PA-C 2:35 PM [facility]

## 2014-12-31 LAB — INSULIN, RANDOM: INSULIN: 19.2 u[IU]/mL (ref 2.0–19.6)

## 2014-12-31 LAB — VITAMIN D 25 HYDROXY (VIT D DEFICIENCY, FRACTURES): VIT D 25 HYDROXY: 48 ng/mL (ref 30–100)

## 2014-12-31 LAB — HEMOGLOBIN A1C
Hgb A1c MFr Bld: 6.3 % — ABNORMAL HIGH (ref <5.7)
Mean Plasma Glucose: 134 mg/dL — ABNORMAL HIGH (ref <117)

## 2015-01-07 DIAGNOSIS — M720 Palmar fascial fibromatosis [Dupuytren]: Secondary | ICD-10-CM | POA: Diagnosis not present

## 2015-01-13 ENCOUNTER — Other Ambulatory Visit (HOSPITAL_BASED_OUTPATIENT_CLINIC_OR_DEPARTMENT_OTHER): Payer: Medicare Other

## 2015-01-13 ENCOUNTER — Encounter: Payer: Self-pay | Admitting: Nurse Practitioner

## 2015-01-13 ENCOUNTER — Ambulatory Visit (HOSPITAL_BASED_OUTPATIENT_CLINIC_OR_DEPARTMENT_OTHER): Payer: Medicare Other | Admitting: Nurse Practitioner

## 2015-01-13 ENCOUNTER — Other Ambulatory Visit: Payer: Self-pay | Admitting: *Deleted

## 2015-01-13 ENCOUNTER — Telehealth: Payer: Self-pay | Admitting: Oncology

## 2015-01-13 VITALS — BP 153/74 | HR 84 | Temp 98.4°F | Resp 18 | Ht 65.5 in | Wt 231.4 lb

## 2015-01-13 DIAGNOSIS — C50312 Malignant neoplasm of lower-inner quadrant of left female breast: Secondary | ICD-10-CM | POA: Diagnosis not present

## 2015-01-13 DIAGNOSIS — Z79811 Long term (current) use of aromatase inhibitors: Secondary | ICD-10-CM

## 2015-01-13 LAB — CBC WITH DIFFERENTIAL/PLATELET
BASO%: 1.5 % (ref 0.0–2.0)
BASOS ABS: 0.1 10*3/uL (ref 0.0–0.1)
EOS ABS: 0.1 10*3/uL (ref 0.0–0.5)
EOS%: 2.2 % (ref 0.0–7.0)
HCT: 30.3 % — ABNORMAL LOW (ref 34.8–46.6)
HGB: 10.1 g/dL — ABNORMAL LOW (ref 11.6–15.9)
LYMPH%: 20.1 % (ref 14.0–49.7)
MCH: 28.8 pg (ref 25.1–34.0)
MCHC: 33.3 g/dL (ref 31.5–36.0)
MCV: 86.5 fL (ref 79.5–101.0)
MONO#: 0.6 10*3/uL (ref 0.1–0.9)
MONO%: 8.8 % (ref 0.0–14.0)
NEUT#: 4.5 10*3/uL (ref 1.5–6.5)
NEUT%: 67.4 % (ref 38.4–76.8)
PLATELETS: 270 10*3/uL (ref 145–400)
RBC: 3.5 10*6/uL — AB (ref 3.70–5.45)
RDW: 14.4 % (ref 11.2–14.5)
WBC: 6.7 10*3/uL (ref 3.9–10.3)
lymph#: 1.3 10*3/uL (ref 0.9–3.3)

## 2015-01-13 LAB — COMPREHENSIVE METABOLIC PANEL (CC13)
ALT: 14 U/L (ref 0–55)
ANION GAP: 7 meq/L (ref 3–11)
AST: 14 U/L (ref 5–34)
Albumin: 3.7 g/dL (ref 3.5–5.0)
Alkaline Phosphatase: 116 U/L (ref 40–150)
BUN: 14.2 mg/dL (ref 7.0–26.0)
CHLORIDE: 108 meq/L (ref 98–109)
CO2: 25 meq/L (ref 22–29)
Calcium: 9.3 mg/dL (ref 8.4–10.4)
Creatinine: 0.7 mg/dL (ref 0.6–1.1)
EGFR: 83 mL/min/{1.73_m2} — AB (ref >90)
GLUCOSE: 100 mg/dL (ref 70–140)
POTASSIUM: 3.9 meq/L (ref 3.5–5.1)
SODIUM: 140 meq/L (ref 136–145)
TOTAL PROTEIN: 7 g/dL (ref 6.4–8.3)

## 2015-01-13 MED ORDER — ANASTROZOLE 1 MG PO TABS: 1.0000 mg | ORAL_TABLET | Freq: Every day | ORAL | Status: DC

## 2015-01-13 NOTE — Telephone Encounter (Signed)
Gave patient avs report and appointments for April 2017.  °

## 2015-01-13 NOTE — Progress Notes (Signed)
[name] [name]  [phone] [phone]     ID: SHAWNIKA [name] DOB: [birth year]  MR#: [id number]  ITJ#:[id number]  Patient Care Team: Unk Pinto, MD as PCP - General (Internal Medicine) [name], MD as Referring Physician (Orthopedic Surgery) Chauncey Cruel, MD as Consulting Physician (Oncology) Inda Castle, MD as Consulting Physician (Gastroenterology) PCP: Alesia Richards, MD GYN: SU: Rolm Bookbinder OTHER MD: Eppie Gibson, Phylliss Bob (ortho)  CHIEF COMPLAINT: Estrogen receptor positive breast cancer  CURRENT TREATMENT: Anastrozole   BREAST CANCER HISTORY: From doctor Khan's earlier note:  "[name] [name] is a 69 y.o. female with  1. A past medical history significant for anxiety GERD shingles anemia and hypertension. Patient also has a family history significant for breast cancer in a sister. Patient underwent a screening mammogram that showed a breast density category C. She was noted to have a mass in the lower inner quadrant of the left breast with spiculated margins and associated microcalcifications. This measures 10 mm. She had an ultrasound performed that showed irregular hypoechoic mass at the 9:00 position 67 cm from the nipple. This measured 5 x 4 x 5 mm. She had a core needle biopsy performed that revealed invasive lobular carcinoma, grade 2, tumor was estrogen receptor positive progesterone receptor positive HER-2/neu negative with a proliferation marker Ki-67 14%. Patient was seen by Dr. Rolm Bookbinder on 05/22/2013 to discuss surgical treatment options. Since then she underwent a left breast radioactive seed guided lumpectomy and axillary sentinel lymph node biopsy. Her final pathology did reveal a 1.8 cm, grade 1, invasive lobular carcinoma Sentinel node was negative for metastatic disease all margins were clear. Tumor again was ER positive PR positive HER-2/neu negative. She overall tolerated the  procedure well.  2. Patient underwent adjuvant radiation therapy under the care of Dr. Isidore Moos from 07/18/13 through 08/28/13."  Her subsequent history is as detailed below  INTERVAL HISTORY: [name] [name] returns today for follow up of her breast cancer. She continues on anastrozole daily. By far, her main complaint is hot flashes, especially since the summer has been so war. Now that is is cooling down, she is experiencing them less frequently. She takes gabapentin for this daily as well. She denies vaginal changes. The interval history is generally unremarkable.  REVIEW OF SYSTEMS: [name] [name] denies fevers, chills, nausea, vomiting, or changes in bowel or bladder habits. She has left knee pain and requires a replacement eventually, but she is reluctant to make plans for this. She is on mobic daily. She is no longer exercising regularly. She has no shortness of breath, chest pain, cough, or palpitations. She is eating and drinking well. She is fatigued at times, but is able to press through it. She endorses some depression related her husband's recent death, but she is grieving appropriately. A detailed review of systems is otherwise stable.  PAST MEDICAL HISTORY: Past Medical History  Diagnosis Date  . Arthritis   . Anxiety   . Hyperlipidemia     takes Atorvastatin 3 times a week  . Shingles   . Anemia   . Allergy     Nasonex daily as needed  . Wears glasses   . PONV (postoperative nausea and vomiting)   . 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 S/P radiation therapy 07/18/2013-08/28/2013    1) Left Breast / 50 Gy in 25 fractions/ 2) Left Breast Boost / 10 Gy in 5 fractions  . Hypertension  takes Cardura nightly and Verapamil daily  . History of shingles   . Sinus drainage     put on Levaquin yesterday if no better in 3 days will start prednisone  . Headache(784.0)     rare  . Weakness     tingling and numbness both hands and left leg  . Joint pain    . Chronic back pain     cyst sitting on L4-5;slipped disc  . GERD (gastroesophageal reflux disease)     takes Omeprazole daily  . History of gastric ulcer at age 62  . Diverticulosis   . Depression     takes Cymbalta daily  . Insomnia     takes Restoril nightly    PAST SURGICAL HISTORY: Past Surgical History  Procedure Laterality Date  . Cataract extraction bilateral w/ anterior vitrectomy Bilateral   . Abdominal hysterectomy    . Cholecystectomy    . Knee arthroscopy Bilateral   . Tonsillectomy    . Colonoscopy  2014  . Upper gi endoscopy    . Breast surgery      lt lump-neg  . Dupuytren / palmar fasciotomy Bilateral 2007    x 3 to left and once to the right   . Tubal ligation    . Foot surgery Right   . Epidural injections      x 3  . Lumbar fusion  10/2013    Dr. Lynann Bologna    FAMILY HISTORY Family History  Problem Relation Age of Onset  . Colon polyps Neg Hx   . Esophageal cancer Neg Hx   . Rectal cancer Neg Hx   . Stomach cancer Neg Hx   . Heart attack Mother   . Stroke Mother   . COPD Father   . Hypertension Father    the patient's father died at the age of 30 from uremia. The patient's mother died at the age of 72 from a heart attack. The patient has one brother, 5 sisters. One of her sisters was diagnosed with breast cancer in her 41s. There is no history of ovarian cancer in the family  GYNECOLOGIC HISTORY:  No LMP recorded. Patient has had a hysterectomy. Menarche age 32, first live birth age 54. The patient is GX P1. She had a hysterectomy with bilateral salpingo-oophorectomy in her 29s and receive hormone replacement for approximately 30 years after that.  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 Her husband died a few years ago with severe respiratory complications. He had a tracheostomy. She was his primary caregiver. She now lives alone with her femal Educational psychologist. Her son Harrell Gave lives in [place]. He does O. designed for [employer]. The patient has one biological and 3 stepgrandchildren. She is a [employer]   ADVANCED DIRECTIVES: Not in place; at the 01/13/2014 visit the patient received the appropriate documents to declare healthcare power of attorney.   HEALTH MAINTENANCE: Social History  Substance Use Topics  . Smoking status: Never Smoker   . Smokeless tobacco: Never Used  . Alcohol Use: Yes     Comment: wine occasionally-rarely     Colonoscopy:  PAP:  Bone density: 01/07/2014, normal  Lipid panel:  Allergies  Allergen Reactions  . Requip [Ropinirole Hcl] Shortness Of Breath and Nausea And Vomiting  . Codeine Other (See Comments)    fatigue  . Minocycline Hives  . Morphine Sulfate Other (See Comments)    Palpitations  . Tetracyclines &  Related Hives    Pt doesn't remember a reaction  . Zestril [Lisinopril] Cough  . Zetia [Ezetimibe] Other (See Comments)    Muscle and joint pain  . Amoxicillin Rash    Pt doesn't remember a reaction  . Sulfa Antibiotics Other (See Comments)    Headache (pt states that a blood pressure medicine caused a severe headache but doesn't remember a reaction to sulfa)    Current Outpatient Prescriptions  Medication Sig Dispense Refill  . atorvastatin (LIPITOR) 80 MG tablet Take 1 tablet (80 mg total) by mouth daily. 90 tablet 3  . Calcium Carbonate-Vitamin D (CALTRATE 600+D) 600-400 MG-UNIT per chew tablet Chew 1 tablet by mouth daily. 180 tablet 4  . Cholecalciferol (VITAMIN D) 2000 UNITS tablet Take 4,000 Units by mouth daily.    Marland Kitchen doxazosin (CARDURA) 8 MG tablet TAKE 1/2 TABLET(4MG) BY MOUTH EVERY DAY 45 tablet 3  . DULoxetine (CYMBALTA) 30 MG capsule TAKE 1 CAPSULE BY MOUTH 3 TIMES DAILY 270 capsule 3  . gabapentin (NEURONTIN) 300 MG capsule TAKE 1 TABLET BY MOUTH EVERY DAY AT BEDTIME 90 capsule 1  . levothyroxine (SYNTHROID, LEVOTHROID) 50 MCG tablet Take 50 mcg by mouth daily before breakfast.    . meloxicam  (MOBIC) 15 MG tablet TAKE 1/2 TO 1 TABLET DAILY WITH FOOR FOR PAIN *DONT TAKE WITH ALEVE OR IBUPROFEN CAN TAKE TYLENOL* 90 tablet 1  . mometasone (NASONEX) 50 MCG/ACT nasal spray USE 1 TO 2 SPRAYS IN EACH NOSTRIL TWICE DAILY 17 g 5  . ranitidine (ZANTAC) 300 MG tablet TAKE 1 TABLET BY MOUTH TWICE A DAY WHILE TRYING TO GET OFF PPI THEN CAN GO TO ONCE NIGHTLY 60 tablet 3  . temazepam (RESTORIL) 30 MG capsule Take 1 capsule (30 mg total) by mouth at bedtime as needed for sleep. 90 capsule 0  . terbinafine (LAMISIL) 250 MG tablet Take 250 mg by mouth daily. Pt takes this medication alternating months    . traMADol (ULTRAM) 50 MG tablet TAKE 1 TABLET BY MOUTH 4 TIMES DAILY AS NEEDED 120 tablet 0  . verapamil (CALAN-SR) 240 MG CR tablet TAKE 1 TABLET BY MOUTH EVERY DAY WITH FOOD 90 tablet 1  . anastrozole (ARIMIDEX) 1 MG tablet Take 1 tablet (1 mg total) by mouth daily. 90 tablet 3  . cyclobenzaprine (FLEXERIL) 10 MG tablet Take 10 mg by mouth at bedtime.  1  . sucralfate (CARAFATE) 1 G tablet Take 1 tablet (1 g total) by mouth 4 (four) times daily. (Patient not taking: Reported on 01/13/2015) 120 tablet 1   No current facility-administered medications for this visit.    OBJECTIVE: Middle-aged white woman in no acute distress Filed Vitals:   01/13/15 1459  BP: 153/74  Pulse: 84  Temp: 98.4 F (36.9 C)  Resp: 18     Body mass index is 37.91 kg/(m^2).    ECOG FS:2 - Symptomatic, <50% confined to bed  Skin: warm, dry  HEENT: sclerae anicteric, conjunctivae pink, oropharynx clear. No thrush or mucositis.  Lymph Nodes: No cervical or supraclavicular lymphadenopathy  Lungs: clear to auscultation bilaterally, no rales, wheezes, or rhonci  Heart: regular rate and rhythm  Abdomen: obese, soft, non tender, positive bowel sounds  Musculoskeletal: No focal spinal tenderness, no peripheral edema  Neuro: non focal, well oriented, positive affect  Breasts: left breast status post lumpectomy and radiation.  Grossly palpable scar tissue to upper inner quadrant along incision line. No evidence of recurrent disease. Left axilla benign. Right breast unremarkable.  LAB RESULTS:  CMP     Component Value Date/Time   NA 140 01/13/2015 1438   NA 137 12/30/2014 1516   K 3.9 01/13/2015 1438   K 4.2 12/30/2014 1516   CL 103 12/30/2014 1516   CO2 25 01/13/2015 1438   CO2 26 12/30/2014 1516   GLUCOSE 100 01/13/2015 1438   GLUCOSE 81 12/30/2014 1516   BUN 14.2 01/13/2015 1438   BUN 20 12/30/2014 1516   CREATININE 0.7 01/13/2015 1438   CREATININE 0.68 12/30/2014 1516   CREATININE 0.68 10/20/2013 0915   CALCIUM 9.3 01/13/2015 1438   CALCIUM 9.1 12/30/2014 1516   PROT 7.0 01/13/2015 1438   PROT 6.6 12/30/2014 1516   ALBUMIN 3.7 01/13/2015 1438   ALBUMIN 4.0 12/30/2014 1516   AST 14 01/13/2015 1438   AST 13 12/30/2014 1516   ALT 14 01/13/2015 1438   ALT 12 12/30/2014 1516   ALKPHOS 116 01/13/2015 1438   ALKPHOS 108 12/30/2014 1516   BILITOT <0.30 01/13/2015 1438   BILITOT 0.3 12/30/2014 1516   GFRNONAA >89 12/30/2014 1516   GFRNONAA 89* 10/20/2013 0915   GFRAA >89 12/30/2014 1516   GFRAA >90 10/20/2013 0915    I No results found for: SPEP  Lab Results  Component Value Date   WBC 6.7 01/13/2015   NEUTROABS 4.5 01/13/2015   HGB 10.1* 01/13/2015   HCT 30.3* 01/13/2015   MCV 86.5 01/13/2015   PLT 270 01/13/2015      Chemistry      Component Value Date/Time   NA 140 01/13/2015 1438   NA 137 12/30/2014 1516   K 3.9 01/13/2015 1438   K 4.2 12/30/2014 1516   CL 103 12/30/2014 1516   CO2 25 01/13/2015 1438   CO2 26 12/30/2014 1516   BUN 14.2 01/13/2015 1438   BUN 20 12/30/2014 1516   CREATININE 0.7 01/13/2015 1438   CREATININE 0.68 12/30/2014 1516   CREATININE 0.68 10/20/2013 0915      Component Value Date/Time   CALCIUM 9.3 01/13/2015 1438   CALCIUM 9.1 12/30/2014 1516   ALKPHOS 116 01/13/2015 1438   ALKPHOS 108 12/30/2014 1516   AST 14 01/13/2015 1438   AST 13  12/30/2014 1516   ALT 14 01/13/2015 1438   ALT 12 12/30/2014 1516   BILITOT <0.30 01/13/2015 1438   BILITOT 0.3 12/30/2014 1516       Lab Results  Component Value Date   LABCA2 30 05/31/2013    No components found for: HTDSK876  No results for input(s): INR in the last 168 hours.  Urinalysis    Component Value Date/Time   COLORURINE YELLOW 06/16/2014 1523   APPEARANCEUR CLEAR 06/16/2014 1523   LABSPEC 1.013 06/16/2014 1523   PHURINE 5.0 06/16/2014 1523   GLUCOSEU NEG 06/16/2014 1523   HGBUR NEG 06/16/2014 1523   BILIRUBINUR NEG 06/16/2014 1523   KETONESUR NEG 06/16/2014 1523   PROTEINUR NEG 06/16/2014 1523   UROBILINOGEN 0.2 06/16/2014 1523   NITRITE NEG 06/16/2014 1523   LEUKOCYTESUR NEG 06/16/2014 1523    STUDIES: .No results found. Most recent mammogram on 05/20/14 was unremarkable Most recent bone density scan on 01/07/14 showed a t-score of 0.9 (normal)  ASSESSMENT: 69 y.o. [place] woman  (1) status post left lumpectomy and sentinel lymph node sampling 06/06/2013 for a pT1c pN0, stage IA invasive lobular carcinoma, grade 1, estrogen receptor and progesterone receptor strongly positive, with no HER-2 amplification, and an MIB-1 of 14%.  (2) Oncotype DX score of 11 predicted  a 10 year risk of outside the breast recurrence of 8% of the patient's only local treatment was tamoxifen for 5 years. It also predicted no benefit from chemotherapy.  (3) adjuvant radiation to the left breast completed 08/28/2013.  (4) started anastrozole June 2015; bone density scan September 2015 was normal (T score positive)  (5) status post back injury requiring posterior lumbar fusion at the L4-5 level  (6) status post remote hysterectomy with bilateral salpingo-oophorectomy  PLAN: Tura continues to do well as far as her breast cancer is concerned. She is now 1.5 years out from her definitve surgery with no evidence of recurrent disease. The labs were reviewed in detail and were  stable. She has a baseline of anemia with hgb in the 10 range.. She is tolerating the anastrozole well and the goal is to continue this drug for 5 years of antiestrogen therapy.   Her next mammogram will be due in February 2017.  She will return in 6 months for labs and a follow up visit just after this repeat scan. She understands and agrees with this plan. She knows the goal of treatment in her case is cure. She has been encouraged to call with any issues that might arise before her next visit here.   Laurie Panda, NP   01/13/2015 3:46 PM

## 2015-01-14 ENCOUNTER — Other Ambulatory Visit: Payer: Self-pay | Admitting: Internal Medicine

## 2015-01-19 DIAGNOSIS — M720 Palmar fascial fibromatosis [Dupuytren]: Secondary | ICD-10-CM | POA: Diagnosis not present

## 2015-01-21 DIAGNOSIS — M720 Palmar fascial fibromatosis [Dupuytren]: Secondary | ICD-10-CM | POA: Diagnosis not present

## 2015-02-06 ENCOUNTER — Other Ambulatory Visit: Payer: Self-pay | Admitting: Internal Medicine

## 2015-02-06 DIAGNOSIS — E039 Hypothyroidism, unspecified: Secondary | ICD-10-CM

## 2015-02-12 DIAGNOSIS — M79671 Pain in right foot: Secondary | ICD-10-CM | POA: Diagnosis not present

## 2015-02-12 DIAGNOSIS — M1712 Unilateral primary osteoarthritis, left knee: Secondary | ICD-10-CM | POA: Diagnosis not present

## 2015-02-19 DIAGNOSIS — M76821 Posterior tibial tendinitis, right leg: Secondary | ICD-10-CM | POA: Diagnosis not present

## 2015-02-19 DIAGNOSIS — M1712 Unilateral primary osteoarthritis, left knee: Secondary | ICD-10-CM | POA: Diagnosis not present

## 2015-02-23 DIAGNOSIS — M720 Palmar fascial fibromatosis [Dupuytren]: Secondary | ICD-10-CM | POA: Diagnosis not present

## 2015-02-25 ENCOUNTER — Other Ambulatory Visit: Payer: Self-pay | Admitting: Internal Medicine

## 2015-02-26 DIAGNOSIS — M1712 Unilateral primary osteoarthritis, left knee: Secondary | ICD-10-CM | POA: Diagnosis not present

## 2015-03-01 IMAGING — MG MM SCREEN MAMMOGRAM BILATERAL
3 series · 3 of 3 positions shown · non-contrast
Comparison: Previous exams.

CLINICAL DATA: Screening recall for a left breast mass seen on
tomosynthesis.

EXAM:
DIGITAL DIAGNOSTIC  LEFT MAMMOGRAM
ULTRASOUND LEFT BREAST

[L CC]
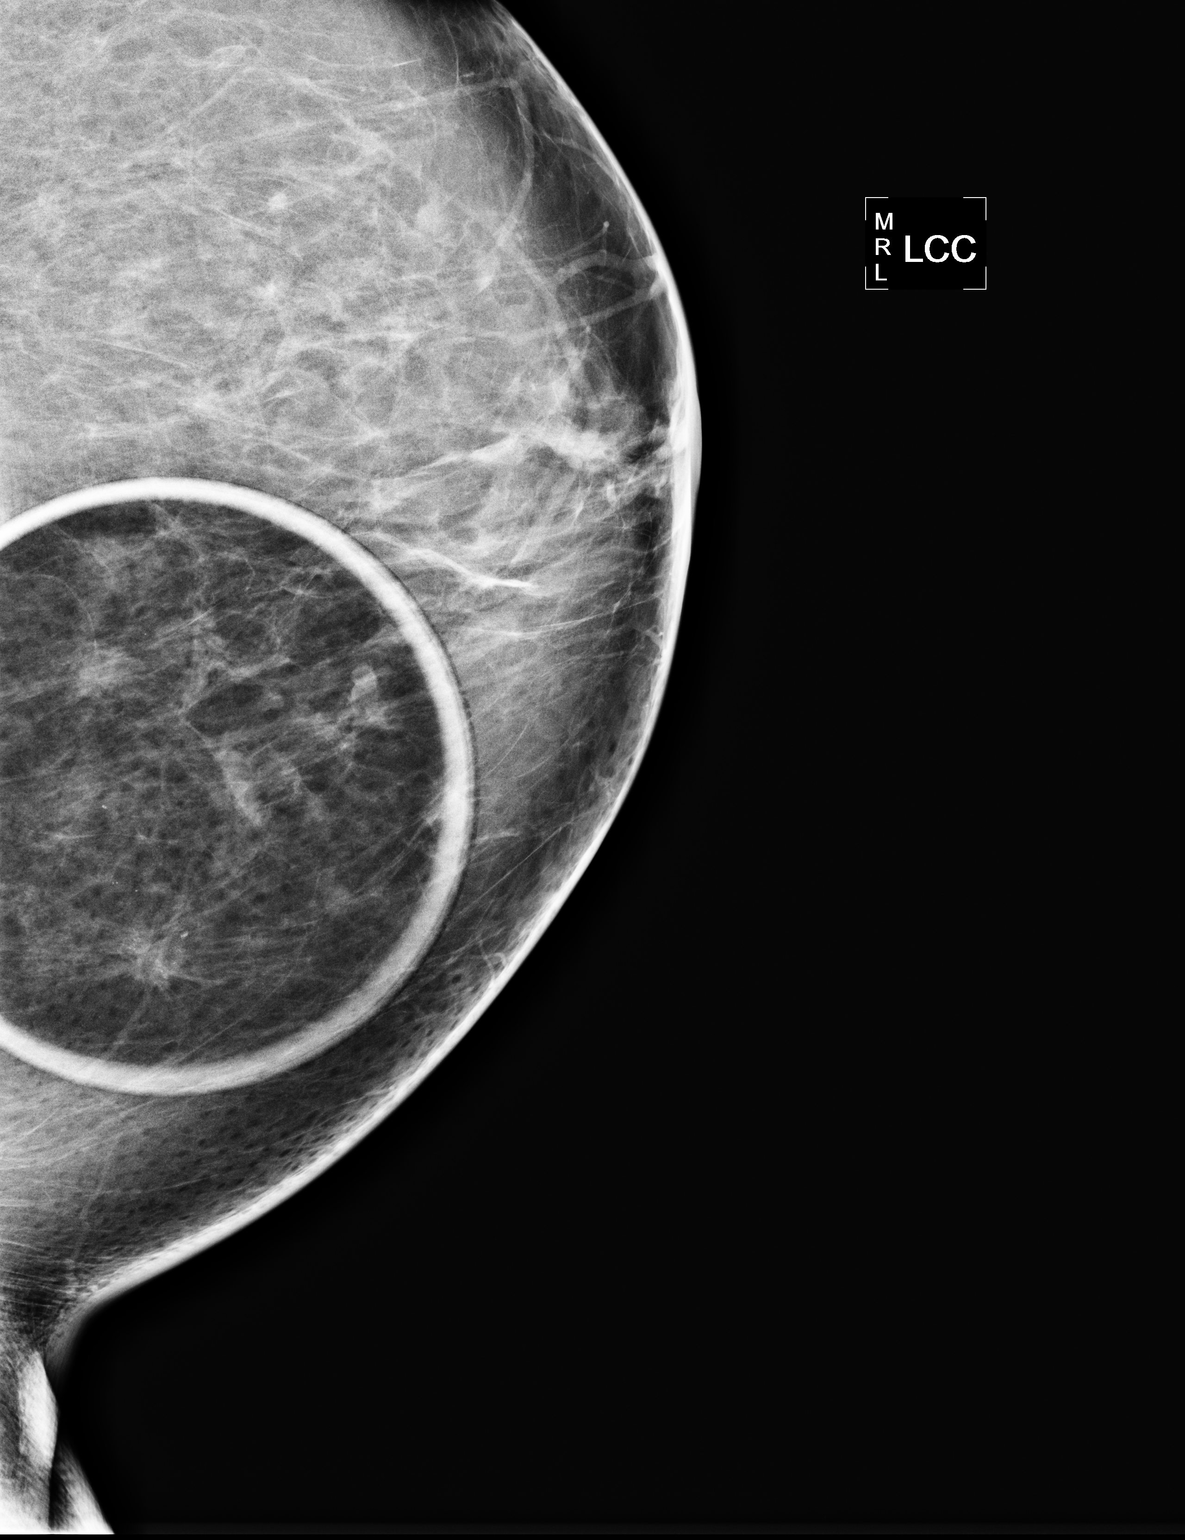

[L MLO (1 of 2)]
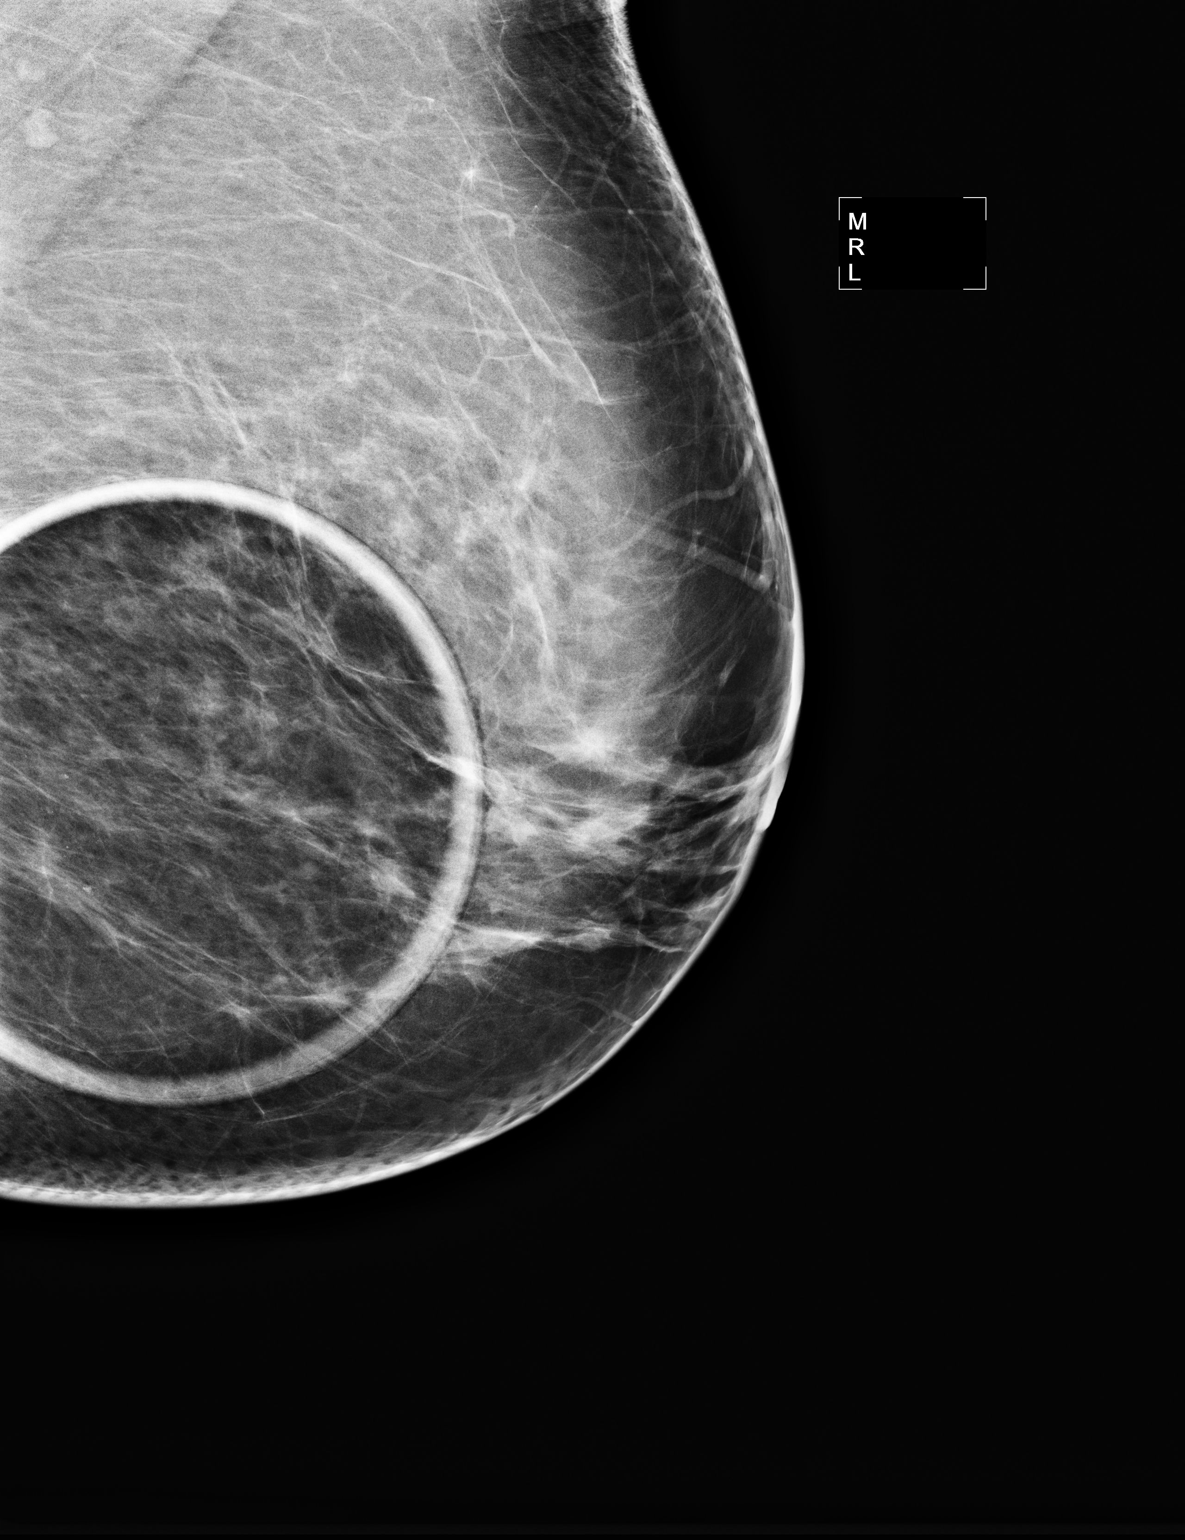

[L MLO (2 of 2)]
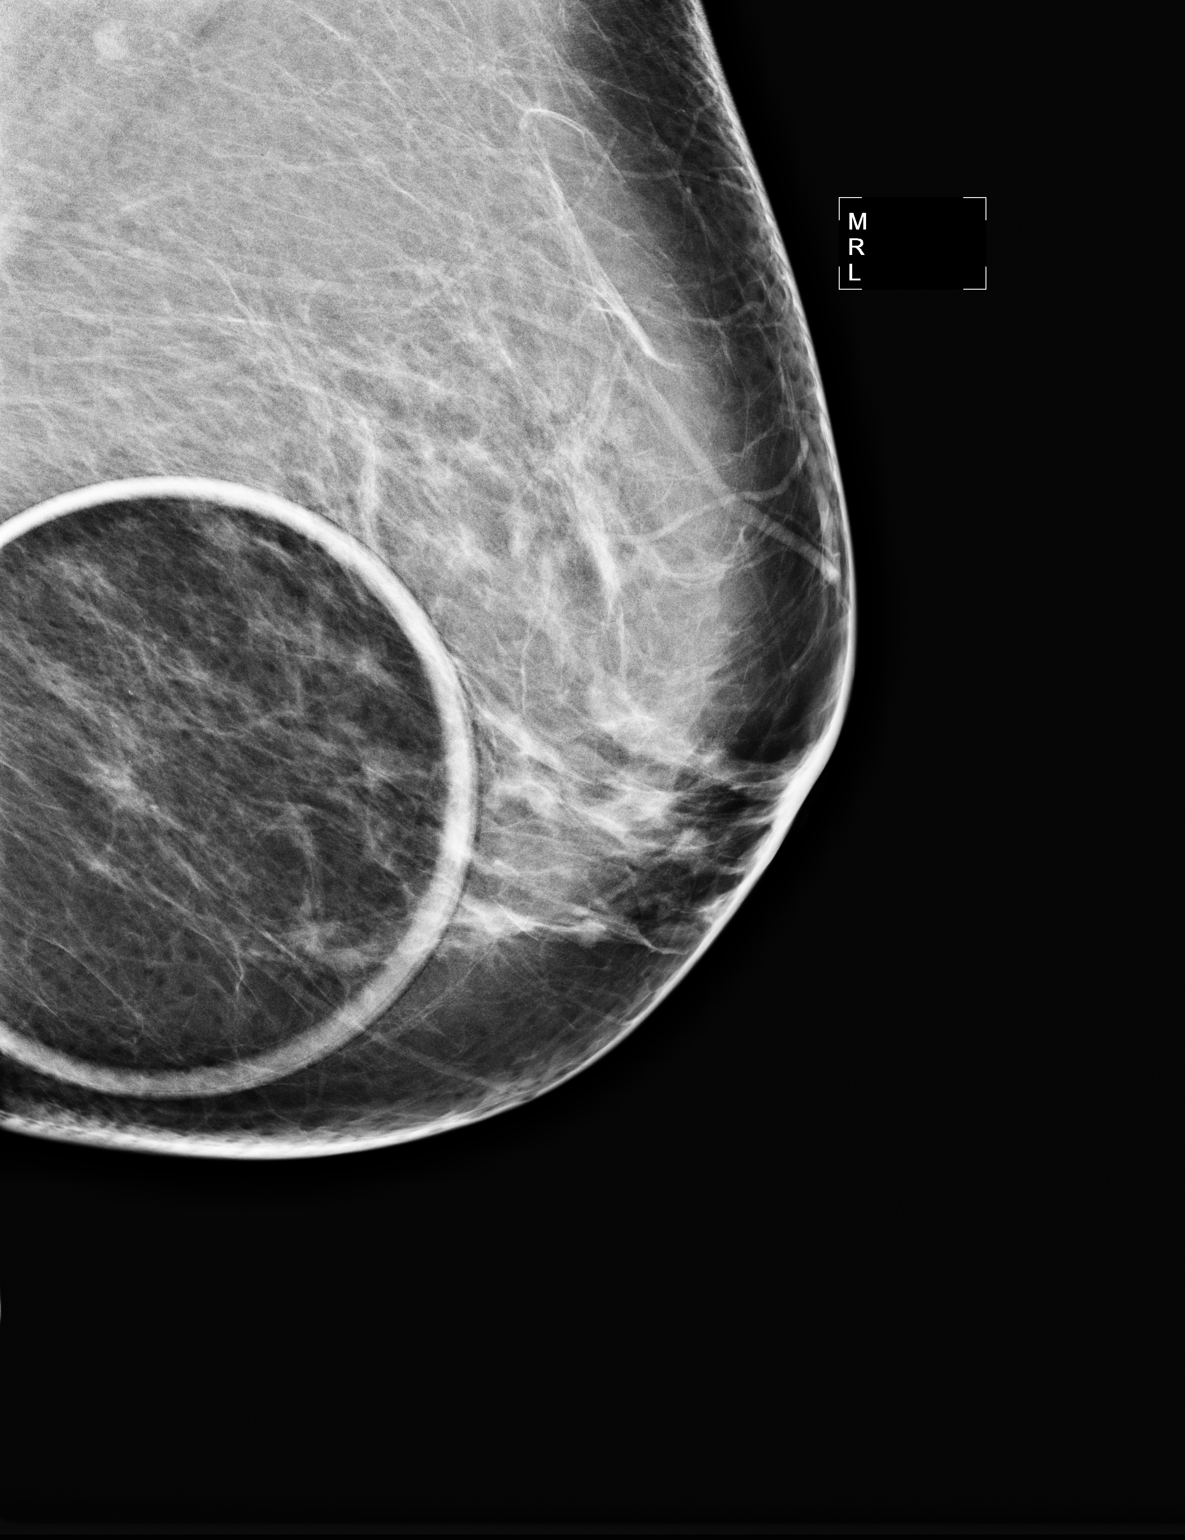

[3 of 3 positions shown; findings below may reference images not displayed]

ACR Breast Density Category c: The breast tissue is heterogeneously
dense, which may obscure small masses.
FINDINGS: There is a mass in the lower far inner left breast with spiculated
margins and associated microcalcifications measuring approximately
10 mm. This is confirmed on the additional spot compression CC and
MLO views.

Physical examination of the inner left breast does not definitely
reveal any palpable masses.

Targeted ultrasound of the left breast was performed demonstrating
an irregular hypoechoic mass at 9 o'clock 6-7 cm from the nipple
(this was initially described as 4 cm from the nipple, however
appeared more medial on the imaging than initially described)
measuring 0.5 x 0.4 x 0.5 cm. This corresponds with mammography
findings.

No lymphadenopathy is seen in the left axilla.
IMPRESSION: Suspicious mass in the left breast at 9 o'clock 6-7 cm from the
nipple.

RECOMMENDATION:
Ultrasound-guided biopsy of the suspicious mass in the medial left
breast is recommended. This is scheduled for 05/15/2013 at 1 p.m..

I have discussed the findings and recommendations with the patient.
Results were also provided in writing at the conclusion of the
visit. If applicable, a reminder letter will be sent to the patient
regarding the next appointment.

BI-RADS CATEGORY  4: Suspicious abnormality - biopsy should be
considered.

## 2015-03-06 ENCOUNTER — Other Ambulatory Visit: Payer: Self-pay | Admitting: Oncology

## 2015-03-10 DIAGNOSIS — M1712 Unilateral primary osteoarthritis, left knee: Secondary | ICD-10-CM | POA: Diagnosis not present

## 2015-03-17 DIAGNOSIS — M1712 Unilateral primary osteoarthritis, left knee: Secondary | ICD-10-CM | POA: Diagnosis not present

## 2015-03-24 DIAGNOSIS — C50912 Malignant neoplasm of unspecified site of left female breast: Secondary | ICD-10-CM | POA: Diagnosis not present

## 2015-03-26 ENCOUNTER — Other Ambulatory Visit: Payer: Self-pay | Admitting: Internal Medicine

## 2015-03-29 ENCOUNTER — Other Ambulatory Visit: Payer: Self-pay | Admitting: Internal Medicine

## 2015-03-30 ENCOUNTER — Encounter: Payer: Self-pay | Admitting: Gastroenterology

## 2015-03-31 ENCOUNTER — Ambulatory Visit (INDEPENDENT_AMBULATORY_CARE_PROVIDER_SITE_OTHER): Payer: Medicare Other | Admitting: Physician Assistant

## 2015-03-31 ENCOUNTER — Other Ambulatory Visit: Payer: Self-pay | Admitting: Physician Assistant

## 2015-03-31 ENCOUNTER — Other Ambulatory Visit: Payer: Self-pay

## 2015-03-31 ENCOUNTER — Encounter: Payer: Self-pay | Admitting: Physician Assistant

## 2015-03-31 ENCOUNTER — Ambulatory Visit: Payer: Self-pay | Admitting: Physician Assistant

## 2015-03-31 VITALS — BP 120/84 | HR 96 | Temp 97.0°F | Resp 16 | Ht 65.5 in | Wt 226.0 lb

## 2015-03-31 DIAGNOSIS — Z79899 Other long term (current) drug therapy: Secondary | ICD-10-CM

## 2015-03-31 DIAGNOSIS — I10 Essential (primary) hypertension: Secondary | ICD-10-CM

## 2015-03-31 DIAGNOSIS — D649 Anemia, unspecified: Secondary | ICD-10-CM | POA: Diagnosis not present

## 2015-03-31 DIAGNOSIS — E782 Mixed hyperlipidemia: Secondary | ICD-10-CM | POA: Diagnosis not present

## 2015-03-31 DIAGNOSIS — R7309 Other abnormal glucose: Secondary | ICD-10-CM

## 2015-03-31 DIAGNOSIS — E559 Vitamin D deficiency, unspecified: Secondary | ICD-10-CM

## 2015-03-31 DIAGNOSIS — Z0000 Encounter for general adult medical examination without abnormal findings: Secondary | ICD-10-CM | POA: Insufficient documentation

## 2015-03-31 DIAGNOSIS — E669 Obesity, unspecified: Secondary | ICD-10-CM

## 2015-03-31 DIAGNOSIS — E538 Deficiency of other specified B group vitamins: Secondary | ICD-10-CM | POA: Diagnosis not present

## 2015-03-31 LAB — LIPID PANEL
CHOL/HDL RATIO: 3.6 ratio (ref <=5.0)
CHOLESTEROL: 162 mg/dL (ref 125–200)
HDL: 45 mg/dL — AB (ref >=46)
LDL Cholesterol: 84 mg/dL (ref <130)
Triglycerides: 163 mg/dL — ABNORMAL HIGH (ref <150)
VLDL: 33 mg/dL — AB (ref <30)

## 2015-03-31 LAB — HEPATIC FUNCTION PANEL
ALBUMIN: 4.1 g/dL (ref 3.6–5.1)
ALK PHOS: 107 U/L (ref 33–130)
ALT: 11 U/L (ref 6–29)
AST: 13 U/L (ref 10–35)
Bilirubin, Direct: 0.1 mg/dL (ref <=0.2)
Indirect Bilirubin: 0.2 mg/dL (ref 0.2–1.2)
TOTAL PROTEIN: 6.8 g/dL (ref 6.1–8.1)
Total Bilirubin: 0.3 mg/dL (ref 0.2–1.2)

## 2015-03-31 LAB — CBC WITH DIFFERENTIAL/PLATELET
BASOS ABS: 0.1 10*3/uL (ref 0.0–0.1)
Basophils Relative: 1 % (ref 0–1)
EOS ABS: 0.2 10*3/uL (ref 0.0–0.7)
EOS PCT: 2 % (ref 0–5)
HEMATOCRIT: 30.3 % — AB (ref 36.0–46.0)
Hemoglobin: 10.1 g/dL — ABNORMAL LOW (ref 12.0–15.0)
LYMPHS ABS: 1.6 10*3/uL (ref 0.7–4.0)
LYMPHS PCT: 21 % (ref 12–46)
MCH: 28.2 pg (ref 26.0–34.0)
MCHC: 33.3 g/dL (ref 30.0–36.0)
MCV: 84.6 fL (ref 78.0–100.0)
MONO ABS: 0.7 10*3/uL (ref 0.1–1.0)
MONOS PCT: 9 % (ref 3–12)
MPV: 9.9 fL (ref 8.6–12.4)
Neutro Abs: 5.2 10*3/uL (ref 1.7–7.7)
Neutrophils Relative %: 67 % (ref 43–77)
Platelets: 313 10*3/uL (ref 150–400)
RBC: 3.58 MIL/uL — ABNORMAL LOW (ref 3.87–5.11)
RDW: 14.5 % (ref 11.5–15.5)
WBC: 7.7 10*3/uL (ref 4.0–10.5)

## 2015-03-31 LAB — BASIC METABOLIC PANEL WITH GFR
BUN: 22 mg/dL (ref 7–25)
CO2: 21 mmol/L (ref 20–31)
CREATININE: 0.56 mg/dL (ref 0.50–0.99)
Calcium: 9.2 mg/dL (ref 8.6–10.4)
Chloride: 103 mmol/L (ref 98–110)
GFR, Est Non African American: >89 mL/min (ref >=60)
GLUCOSE: 82 mg/dL (ref 65–99)
POTASSIUM: 4.2 mmol/L (ref 3.5–5.3)
Sodium: 141 mmol/L (ref 135–146)

## 2015-03-31 LAB — MAGNESIUM: Magnesium: 2.1 mg/dL (ref 1.5–2.5)

## 2015-03-31 MED ORDER — TRAMADOL HCL 50 MG PO TABS: ORAL_TABLET | ORAL | Status: DC

## 2015-03-31 MED ORDER — AZITHROMYCIN 250 MG PO TABS: ORAL_TABLET | ORAL | Status: AC

## 2015-03-31 NOTE — Patient Instructions (Signed)
HOW TO TREAT VIRAL COUGH AND COLD SYMPTOMS:  -Symptoms usually last at least 1 week with the worst symptoms being around day 4.  - colds usually start with a sore throat and end with a cough, and the cough can take 2 weeks to get better.  -No antibiotics are needed for colds, flu, sore throats, cough, bronchitis UNLESS symptoms are longer than 7 days OR if you are getting better then get drastically worse.  -There are a lot of combination medications (Dayquil, Nyquil, Vicks 44, tyelnol cold and sinus, ETC). Please look at the ingredients on the back so that you are treating the correct symptoms and not doubling up on medications/ingredients.    Medicines you can use  Nasal congestion  - pseudoephedrine (Sudafed)- behind the counter, do not use if you have high blood pressure, medicine that have -D in them.  - phenylephrine (Sudafed PE) -Dextormethorphan + chlorpheniramine (Coridcidin HBP)- okay if you have high blood pressure -Oxymetazoline (Afrin) nasal spray- LIMIT to 3 days -Saline nasal spray -Neti pot (used distilled or bottled water)  Ear pain/congestion  -pseudoephedrine (sudafed) - Nasonex/flonase nasal spray  Fever  -Acetaminophen (Tyelnol) -Ibuprofen (Advil, motrin, aleve)  Sore Throat  -Acetaminophen (Tyelnol) -Ibuprofen (Advil, motrin, aleve) -Drink a lot of water -Gargle with salt water - Rest your voice (don't talk) -Throat sprays -Cough drops  Body Aches  -Acetaminophen (Tyelnol) -Ibuprofen (Advil, motrin, aleve)  Headache  -Acetaminophen (Tyelnol) -Ibuprofen (Advil, motrin, aleve) - Exedrin, Exedrin Migraine  Allergy symptoms (cough, sneeze, runny nose, itchy eyes) -Claritin or loratadine cheapest but likely the weakest  -Zyrtec or certizine at night because it can make you sleepy -The strongest is allegra or fexafinadine  Cheapest at [employer], [employer], [employer]  Cough  -Dextromethorphan (Delsym)- medicine that has DM in it -Guafenesin  (Mucinex/Robitussin) - cough drops - drink lots of water  Chest Congestion  -Guafenesin (Mucinex/Robitussin)  Red Itchy Eyes  - Naphcon-A  Upset Stomach  - Bland diet (nothing spicy, greasy, fried, and high acid foods like tomatoes, oranges, berries) -OKAY- cereal, bread, soup, crackers, rice -Eat smaller more frequent meals -reduce caffeine, no alcohol -Loperamide (Imodium-AD) if diarrhea -Prevacid for heart burn  General health when sick  -Hydration -wash your hands frequently -keep surfaces clean -change pillow cases and sheets often -Get fresh air but do not exercise strenuously -Vitamin D, double up on it - Vitamin C -Zinc        We want weight loss that will last so you should lose 1-2 pounds a week.  THAT IS IT! Please pick THREE things a month to change. Once it is a habit check off the item. Then pick another three items off the list to become habits.  If you are already doing a habit on the list GREAT!  Cross that item off! o Don't drink your calories. Ie, alcohol, soda, fruit juice, and sweet tea.  o Drink more water. Drink a glass when you feel hungry or before each meal.  o Eat breakfast - Complex carb and protein (likeDannon light and fit yogurt, oatmeal, fruit, eggs, [place] bacon). o Measure your cereal.  Eat no more than one cup a day. (ie [place]) o Eat an apple a day. o Add a vegetable a day. o Try a new vegetable a month. o Use Pam! Stop using oil or butter to cook. o Don't finish your plate or use smaller plates. o Share your dessert. o Eat sugar free Jello for dessert or frozen grapes. o Don't eat 2-3  hours before bed. o Switch to whole wheat bread, pasta, and brown rice. o Make healthier choices when you eat out. No fries! o Pick baked chicken, NOT fried. o Don't forget to SLOW DOWN when you eat. It is not going anywhere.  o Take the stairs. o Park far away in the parking lot o [employer] (or weights) for 10 minutes while watching  TV. o Walk at work for 10 minutes during break. o Walk outside 1 time a week with your friend, kids, dog, or significant other. o Start a walking group at [place] the mall as much as you can tolerate.  o Keep a food diary. o Weigh yourself daily. o Walk for 15 minutes 3 days per week. o Cook at home more often and eat out less.  If life happens and you go back to old habits, it is okay.  Just start over. You can do it!   If you experience chest pain, get short of breath, or tired during the exercise, please stop immediately and inform your doctor.

## 2015-03-31 NOTE — Progress Notes (Signed)
Assessment and Plan:  Hypertension: Continue medication, monitor blood pressure at home. Continue DASH diet.  Reminder to go to the ER if any CP, SOB, nausea, dizziness, severe HA, changes vision/speech, left arm numbness and tingling, and jaw pain. Cholesterol: Continue diet and exercise. Check cholesterol.  Pre-diabetes-Continue diet and exercise. Check A1C Vitamin D Def- check level and continue medications.  Obesity with co morbidities- long discussion about weight loss, diet, and exercise Cough/sinus infection- hold zpak  Continue diet and meds as discussed. Further disposition pending results of labs. Future Appointments Date Time Provider [place]  03/31/2015 4:00 PM Vicie Mutters, [place] [facility] None  06/23/2015 2:00 PM Vicie Mutters, PA-C [facility] None  07/13/2015 2:00 PM [facility] 1 [facility] None  07/13/2015 2:30 PM Chauncey Cruel, MD [facility] None    HPI 69 y.o. female  presents for 3 month follow up with hypertension, hyperlipidemia, prediabetes and vitamin D. Her blood pressure has been controlled at home, she is on cardura 8mg , verapamil 240 at night, today their BP is BP: 120/84 mmHg She does not workout. She denies chest pain, shortness of breath, dizziness.  She is on cholesterol medication, lipitor 80mg  1/2 pill every day and denies myalgias. Her cholesterol is not at goal. The cholesterol last visit was:   Lab Results  Component Value Date   CHOL 155 12/30/2014   HDL 47 12/30/2014   LDLCALC 81 12/30/2014   TRIG 137 12/30/2014   CHOLHDL 3.3 12/30/2014   She has been working on diet and exercise for prediabetes, and denies paresthesia of the feet, polydipsia, polyuria and visual disturbances. Last A1C in the office was:  Lab Results  Component Value Date   HGBA1C 6.3* 12/30/2014   Patient is not on Vitamin D supplement, since she started calcitrate with Vit D, has not been taking it for 3 months.  Lab Results  Component Value Date   VD25OH 48 12/30/2014     Follows with Dr. [name] for lower back surgery, recently went to them for right lateral ankle pain, 4-5 weeks ago twisted her ankle [place] a grave [place], has been following with them. Suppose to be wearing a boot.  Follows with Dr. Griffith Citron for left breast cancer, had lumpectomy and radiation.  BMI is Body mass index is 37.02 kg/(m^2)., she is working on diet and exercise. Wt Readings from Last 3 Encounters:  03/31/15 226 lb (102.513 kg)  01/13/15 231 lb 6.4 oz (104.962 kg)  12/30/14 231 lb (104.781 kg)    Current Medications:  Current Outpatient Prescriptions on File Prior to Visit  Medication Sig Dispense Refill  . anastrozole (ARIMIDEX) 1 MG tablet TAKE 1 TABLET BY MOUTH EVERY DAY 90 tablet 3  . atorvastatin (LIPITOR) 80 MG tablet Take 1 tablet (80 mg total) by mouth daily. 90 tablet 3  . Calcium Carbonate-Vitamin D (CALTRATE 600+D) 600-400 MG-UNIT per chew tablet Chew 1 tablet by mouth daily. 180 tablet 4  . Cholecalciferol (VITAMIN D) 2000 UNITS tablet Take 4,000 Units by mouth daily.    . cyclobenzaprine (FLEXERIL) 10 MG tablet Take 10 mg by mouth at bedtime.  1  . doxazosin (CARDURA) 8 MG tablet TAKE 1/2 TABLET(4MG ) BY MOUTH EVERY DAY 45 tablet 3  . DULoxetine (CYMBALTA) 30 MG capsule TAKE 1 CAPSULE BY MOUTH 3 TIMES DAILY 270 capsule 3  . gabapentin (NEURONTIN) 300 MG capsule TAKE ONE CAPSULE BY MOUTH AT BEDTIME 90 capsule 1  . levothyroxine (SYNTHROID, LEVOTHROID) 50 MCG tablet TAKE 1/2 OR 1 TABLET BY  MOUTH EVERY MORNING 30-60 MINS BEFORE BREAKFAST ON AN EMPTY STOMACH 90 tablet 1  . meloxicam (MOBIC) 15 MG tablet TAKE 1/2 TO 1 TABLET DAILY WITH FOOR FOR PAIN *DONT TAKE WITH ALEVE OR IBUPROFEN CAN TAKE TYLENOL* 90 tablet 1  . mometasone (NASONEX) 50 MCG/ACT nasal spray USE 1 TO 2 SPRAYS IN EACH NOSTRIL TWICE DAILY 17 g 99  . ranitidine (ZANTAC) 300 MG tablet TAKE 1 TABLET BY MOUTH TWICE A DAY WHILE TRYING TO GET OFF PPI THEN CAN GO TO ONCE NIGHTLY 60 tablet 3  .  sucralfate (CARAFATE) 1 G tablet TAKE 1 TABLET BY MOUTH 4 TIMES A DAY 120 tablet 1  . temazepam (RESTORIL) 30 MG capsule Take 1 capsule (30 mg total) by mouth at bedtime as needed for sleep. 90 capsule 0  . terbinafine (LAMISIL) 250 MG tablet Take 250 mg by mouth daily. Pt takes this medication alternating months    . traMADol (ULTRAM) 50 MG tablet TAKE 1 TABLET BY MOUTH 4 TIMES DAILY AS NEEDED 120 tablet 0  . verapamil (CALAN-SR) 240 MG CR tablet TAKE 1 TABLET BY MOUTH EVERY DAY WITH FOOD 90 tablet 1   No current facility-administered medications on file prior to visit.   Medical History:  Past Medical History  Diagnosis Date  . Arthritis   . Anxiety   . Hyperlipidemia     takes Atorvastatin 3 times a week  . Shingles   . Anemia   . Allergy     Nasonex daily as needed  . Wears glasses   . PONV (postoperative nausea and vomiting)   . 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 S/P radiation therapy 07/18/2013-08/28/2013    1) Left Breast / 50 Gy in 25 fractions/ 2) Left Breast Boost / 10 Gy in 5 fractions  . Hypertension     takes Cardura nightly and Verapamil daily  . History of shingles   . Sinus drainage     put on Levaquin yesterday if no better in 3 days will start prednisone  . Headache(784.0)     rare  . Weakness     tingling and numbness both hands and left leg  . Joint pain   . Chronic back pain     cyst sitting on L4-5;slipped disc  . GERD (gastroesophageal reflux disease)     takes Omeprazole daily  . History of gastric ulcer at age 58  . Diverticulosis   . Depression     takes Cymbalta daily  . Insomnia     takes Restoril nightly   Allergies:  Allergies  Allergen Reactions  . Requip [Ropinirole Hcl] Shortness Of Breath and Nausea And Vomiting  . Codeine Other (See Comments)    fatigue  . Minocycline Hives  . Morphine Sulfate Other (See Comments)    Palpitations  . Tetracyclines & Related Hives    Pt doesn't  remember a reaction  . Zestril [Lisinopril] Cough  . Zetia [Ezetimibe] Other (See Comments)    Muscle and joint pain  . Amoxicillin Rash    Pt doesn't remember a reaction  . Sulfa Antibiotics Other (See Comments)    Headache (pt states that a blood pressure medicine caused a severe headache but doesn't remember a reaction to sulfa)     Review of Systems:  Review of Systems  Constitutional: Negative.  Negative for fever and chills.  HENT: Positive for congestion (x 1 week) and sore throat. Negative  for ear discharge, ear pain, hearing loss and tinnitus.   Respiratory: Positive for cough and sputum production (yellow). Negative for hemoptysis, shortness of breath, wheezing and stridor.   Cardiovascular: Negative.   Gastrointestinal: Negative for heartburn, nausea, vomiting, abdominal pain, diarrhea, constipation, blood in stool and melena.  Genitourinary: Negative.  Negative for dysuria, urgency, frequency, hematuria and flank pain.  Musculoskeletal: Positive for back pain and joint pain (right ankle, following with Dr. [name]). Negative for myalgias, falls and neck pain.  Skin: Negative.   Neurological: Negative.  Negative for headaches.  Psychiatric/Behavioral: Negative.     Family history- Review and unchanged Social history- Review and unchanged Physical Exam: BP 120/84 mmHg  Pulse 96  Temp(Src) 97 F (36.1 C) (Temporal)  Resp 16  Ht 5' 5.5" (1.664 m)  Wt 226 lb (102.513 kg)  BMI 37.02 kg/m2  SpO2 97% Wt Readings from Last 3 Encounters:  03/31/15 226 lb (102.513 kg)  01/13/15 231 lb 6.4 oz (104.962 kg)  12/30/14 231 lb (104.781 kg)   General Appearance: Well nourished, in no apparent distress. Eyes: PERRLA, EOMs, conjunctiva no swelling or erythema Sinuses: No Frontal/maxillary tenderness ENT/Mouth: Ext aud canals clear, TMs without erythema, bulging. No erythema, swelling, or exudate on post pharynx.  Tonsils not swollen or erythematous. Hearing normal.  Neck: Supple,  thyroid normal.  Respiratory: Respiratory effort normal, BS equal bilaterally without rales, rhonchi, wheezing or stridor.  Cardio: RRR with 2/6 soft holosystolic murmur. Brisk peripheral pulses without edema.  Abdomen: Soft, + BS, obese, + epigastric tenderness and suprapubic pain, no guarding, rebound, hernias, masses. + healing deep vertical scar from naval down lower AB. Lymphatics: Non tender without lymphadenopathy.  Musculoskeletal: Full ROM, 5/5 strength, normal gait.  Skin: Warm, dry without rashes, lesions, ecchymosis.  Neuro: Cranial nerves intact. Normal muscle tone, no cerebellar symptoms. Sensation intact.  Psych: Awake and oriented X 3, normal affect, Insight and Judgment appropriate.    Vicie Mutters, PA-C 3:41 PM [facility]

## 2015-04-01 LAB — IRON AND TIBC
%SAT: 8 % — AB (ref 11–50)
Iron: 28 ug/dL — ABNORMAL LOW (ref 45–160)
TIBC: 364 ug/dL (ref 250–450)
UIBC: 336 ug/dL (ref 125–400)

## 2015-04-01 LAB — TSH: TSH: 0.989 u[IU]/mL (ref 0.350–4.500)

## 2015-04-01 LAB — HEMOGLOBIN A1C
Hgb A1c MFr Bld: 6.2 % — ABNORMAL HIGH (ref <5.7)
MEAN PLASMA GLUCOSE: 131 mg/dL — AB (ref <117)

## 2015-04-01 LAB — FERRITIN: Ferritin: 38 ng/mL (ref 10–291)

## 2015-04-01 LAB — VITAMIN B12: VITAMIN B 12: 412 pg/mL (ref 211–911)

## 2015-04-07 ENCOUNTER — Other Ambulatory Visit: Payer: Self-pay | Admitting: Physician Assistant

## 2015-04-10 ENCOUNTER — Encounter: Payer: Self-pay | Admitting: *Deleted

## 2015-04-26 ENCOUNTER — Other Ambulatory Visit: Payer: Self-pay | Admitting: Internal Medicine

## 2015-04-28 ENCOUNTER — Other Ambulatory Visit: Payer: Self-pay | Admitting: Oncology

## 2015-04-28 ENCOUNTER — Other Ambulatory Visit: Payer: Self-pay

## 2015-04-28 DIAGNOSIS — Z9889 Other specified postprocedural states: Secondary | ICD-10-CM

## 2015-05-11 ENCOUNTER — Other Ambulatory Visit: Payer: Self-pay | Admitting: Internal Medicine

## 2015-05-11 DIAGNOSIS — G4700 Insomnia, unspecified: Secondary | ICD-10-CM

## 2015-05-11 MED ORDER — TEMAZEPAM 30 MG PO CAPS: ORAL_CAPSULE | ORAL | Status: DC

## 2015-05-18 ENCOUNTER — Telehealth: Payer: Self-pay | Admitting: Internal Medicine

## 2015-05-18 MED ORDER — AZITHROMYCIN 250 MG PO TABS: ORAL_TABLET | ORAL | Status: DC

## 2015-05-18 MED ORDER — PROMETHAZINE-DM 6.25-15 MG/5ML PO SYRP: ORAL_SOLUTION | ORAL | Status: DC

## 2015-05-18 MED ORDER — PREDNISONE 20 MG PO TABS
ORAL_TABLET | ORAL | Status: DC
Start: 2015-05-18 — End: 2015-06-23

## 2015-05-18 NOTE — Telephone Encounter (Signed)
Patient called multiple times for antibiotics for fever, bloody sinus drainage, and headache since Friday.  Will send in zpak, prednisone, and cough medication.

## 2015-05-22 ENCOUNTER — Inpatient Hospital Stay: Admission: RE | Admit: 2015-05-22 | Payer: Medicare Other | Source: Ambulatory Visit

## 2015-05-28 ENCOUNTER — Ambulatory Visit
Admission: RE | Admit: 2015-05-28 | Discharge: 2015-05-28 | Disposition: A | Discharge status: Home / Self Care | Payer: Medicare Other | Source: Ambulatory Visit | Attending: Oncology | Admitting: Oncology

## 2015-05-28 DIAGNOSIS — Z9889 Other specified postprocedural states: Secondary | ICD-10-CM

## 2015-06-22 ENCOUNTER — Encounter: Payer: Self-pay | Admitting: *Deleted

## 2015-06-23 ENCOUNTER — Other Ambulatory Visit: Payer: Self-pay | Admitting: Physician Assistant

## 2015-06-23 ENCOUNTER — Ambulatory Visit (INDEPENDENT_AMBULATORY_CARE_PROVIDER_SITE_OTHER): Payer: Medicare Other | Admitting: Physician Assistant

## 2015-06-23 ENCOUNTER — Encounter: Payer: Self-pay | Admitting: Physician Assistant

## 2015-06-23 VITALS — BP 136/74 | HR 92 | Temp 97.3°F | Resp 16 | Ht 65.0 in | Wt 233.2 lb

## 2015-06-23 DIAGNOSIS — M5416 Radiculopathy, lumbar region: Secondary | ICD-10-CM

## 2015-06-23 DIAGNOSIS — G4700 Insomnia, unspecified: Secondary | ICD-10-CM

## 2015-06-23 DIAGNOSIS — Z0000 Encounter for general adult medical examination without abnormal findings: Secondary | ICD-10-CM

## 2015-06-23 DIAGNOSIS — M51369 Other intervertebral disc degeneration, lumbar region without mention of lumbar back pain or lower extremity pain: Secondary | ICD-10-CM

## 2015-06-23 DIAGNOSIS — Z0001 Encounter for general adult medical examination with abnormal findings: Secondary | ICD-10-CM

## 2015-06-23 DIAGNOSIS — I471 Supraventricular tachycardia, unspecified: Secondary | ICD-10-CM

## 2015-06-23 DIAGNOSIS — M5136 Other intervertebral disc degeneration, lumbar region: Secondary | ICD-10-CM

## 2015-06-23 DIAGNOSIS — J309 Allergic rhinitis, unspecified: Secondary | ICD-10-CM

## 2015-06-23 DIAGNOSIS — E669 Obesity, unspecified: Secondary | ICD-10-CM

## 2015-06-23 DIAGNOSIS — K219 Gastro-esophageal reflux disease without esophagitis: Secondary | ICD-10-CM

## 2015-06-23 DIAGNOSIS — E039 Hypothyroidism, unspecified: Secondary | ICD-10-CM

## 2015-06-23 DIAGNOSIS — F341 Dysthymic disorder: Secondary | ICD-10-CM

## 2015-06-23 DIAGNOSIS — E559 Vitamin D deficiency, unspecified: Secondary | ICD-10-CM

## 2015-06-23 DIAGNOSIS — Z8719 Personal history of other diseases of the digestive system: Secondary | ICD-10-CM

## 2015-06-23 DIAGNOSIS — Z8679 Personal history of other diseases of the circulatory system: Secondary | ICD-10-CM

## 2015-06-23 DIAGNOSIS — E782 Mixed hyperlipidemia: Secondary | ICD-10-CM

## 2015-06-23 DIAGNOSIS — R6889 Other general symptoms and signs: Secondary | ICD-10-CM

## 2015-06-23 DIAGNOSIS — I10 Essential (primary) hypertension: Secondary | ICD-10-CM

## 2015-06-23 DIAGNOSIS — Z79899 Other long term (current) drug therapy: Secondary | ICD-10-CM

## 2015-06-23 DIAGNOSIS — R7303 Prediabetes: Secondary | ICD-10-CM

## 2015-06-23 DIAGNOSIS — C50312 Malignant neoplasm of lower-inner quadrant of left female breast: Secondary | ICD-10-CM

## 2015-06-23 LAB — MAGNESIUM: MAGNESIUM: 1.9 mg/dL (ref 1.5–2.5)

## 2015-06-23 LAB — HEPATIC FUNCTION PANEL
ALT: 16 U/L (ref 6–29)
AST: 18 U/L (ref 10–35)
Albumin: 4 g/dL (ref 3.6–5.1)
Alkaline Phosphatase: 105 U/L (ref 33–130)
Bilirubin, Direct: 0.1 mg/dL (ref <=0.2)
Indirect Bilirubin: 0.2 mg/dL (ref 0.2–1.2)
Total Bilirubin: 0.3 mg/dL (ref 0.2–1.2)
Total Protein: 6.8 g/dL (ref 6.1–8.1)

## 2015-06-23 LAB — CBC WITH DIFFERENTIAL/PLATELET
Basophils Absolute: 0.1 10*3/uL (ref 0.0–0.1)
Basophils Relative: 1 % (ref 0–1)
Eosinophils Absolute: 0.1 10*3/uL (ref 0.0–0.7)
Eosinophils Relative: 2 % (ref 0–5)
HCT: 29.4 % — ABNORMAL LOW (ref 36.0–46.0)
Hemoglobin: 9.2 g/dL — ABNORMAL LOW (ref 12.0–15.0)
Lymphocytes Relative: 20 % (ref 12–46)
Lymphs Abs: 1.4 10*3/uL (ref 0.7–4.0)
MCH: 26.7 pg (ref 26.0–34.0)
MCHC: 31.3 g/dL (ref 30.0–36.0)
MCV: 85.2 fL (ref 78.0–100.0)
MPV: 10.2 fL (ref 8.6–12.4)
Monocytes Absolute: 0.7 10*3/uL (ref 0.1–1.0)
Monocytes Relative: 10 % (ref 3–12)
Neutro Abs: 4.6 10*3/uL (ref 1.7–7.7)
Neutrophils Relative %: 67 % (ref 43–77)
Platelets: 326 10*3/uL (ref 150–400)
RBC: 3.45 MIL/uL — ABNORMAL LOW (ref 3.87–5.11)
RDW: 15.1 % (ref 11.5–15.5)
WBC: 6.8 10*3/uL (ref 4.0–10.5)

## 2015-06-23 LAB — BASIC METABOLIC PANEL WITH GFR
BUN: 18 mg/dL (ref 7–25)
CALCIUM: 9.8 mg/dL (ref 8.6–10.4)
CO2: 25 mmol/L (ref 20–31)
CREATININE: 0.64 mg/dL (ref 0.50–0.99)
Chloride: 101 mmol/L (ref 98–110)
GFR, Est Non African American: >89 mL/min (ref >=60)
Glucose, Bld: 83 mg/dL (ref 65–99)
Potassium: 4 mmol/L (ref 3.5–5.3)
SODIUM: 138 mmol/L (ref 135–146)

## 2015-06-23 LAB — LIPID PANEL
CHOL/HDL RATIO: 3 ratio (ref <=5.0)
CHOLESTEROL: 160 mg/dL (ref 125–200)
HDL: 54 mg/dL (ref >=46)
LDL Cholesterol: 85 mg/dL (ref <130)
Triglycerides: 107 mg/dL (ref <150)
VLDL: 21 mg/dL (ref <30)

## 2015-06-23 MED ORDER — VERAPAMIL HCL ER 240 MG PO TBCR: 240.0000 mg | EXTENDED_RELEASE_TABLET | Freq: Every day | ORAL | Status: DC

## 2015-06-23 MED ORDER — MELOXICAM 15 MG PO TABS: ORAL_TABLET | ORAL | Status: DC

## 2015-06-23 MED ORDER — DULOXETINE HCL 30 MG PO CPEP: 30.0000 mg | ORAL_CAPSULE | Freq: Three times a day (TID) | ORAL | Status: DC

## 2015-06-23 MED ORDER — DOXAZOSIN MESYLATE 4 MG PO TABS: 4.0000 mg | ORAL_TABLET | Freq: Every day | ORAL | Status: DC

## 2015-06-23 MED ORDER — LEVOTHYROXINE SODIUM 50 MCG PO TABS: ORAL_TABLET | ORAL | Status: DC

## 2015-06-23 MED ORDER — GABAPENTIN 300 MG PO CAPS: 300.0000 mg | ORAL_CAPSULE | Freq: Every day | ORAL | Status: DC

## 2015-06-23 MED ORDER — MOMETASONE FUROATE 50 MCG/ACT NA SUSP: NASAL | Status: DC

## 2015-06-23 MED ORDER — TEMAZEPAM 30 MG PO CAPS: ORAL_CAPSULE | ORAL | Status: DC

## 2015-06-23 MED ORDER — RANITIDINE HCL 300 MG PO TABS: ORAL_TABLET | ORAL | Status: DC

## 2015-06-23 NOTE — Progress Notes (Signed)
MEDICARE ANNUAL WELLNESS VISIT AND 3 month OV  Assessment:   1. Essential hypertension - continue medications, DASH diet, exercise and monitor at home. Call if greater than 130/80. - CBC with Differential/Platelet - BASIC METABOLIC PANEL WITH GFR - Hepatic function panel - TSH - Urinalysis, Routine w reflex microscopic - Microalbumin / creatinine urine ratio - EKG 12-Lead - [place], RETROPERITNL ABD,  LTD  2. H/O left bundle branch block No CP/SOB  3. Paroxysmal supraventricular tachycardia avoid triggers such as alcohol, smoking, caffeine etc, Valsalva taught, call if worsening palpations, dizziness, CP, SOB.  4. Prediabetes Discussed general issues about diabetes pathophysiology and management., Educational material distributed., Suggested low cholesterol diet., Encouraged aerobic exercise., Discussed foot care., Reminded to get yearly retinal exam. - Hemoglobin A1c - Insulin, fasting - HM DIABETES FOOT EXAM  5. Hyperlipidemia -continue medications, check lipids, decrease fatty foods, increase activity.  - Lipid panel  6. Cancer of lower-inner quadrant of left female breast Continue follow up Dr. Griffith Citron  7. Obesity (BMI 30-39.9) Obesity with co morbidities- long discussion about weight loss, diet, and exercise  8. Vitamin D deficiency - Vit D  25 hydroxy (rtn osteoporosis monitoring)  9. Medication management - Magnesium  10. History of pancreatitis controlled  11. 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   12. Allergic rhinitis, unspecified allergic rhinitis type Allergic rhinitis- Allegra OTC, increase H20, allergy hygiene explained.  13. Gastroesophageal reflux disease, esophagitis presence not specified GERD- will try to get off PPiI given info for taper and zantac sent in  14. Lumbar radiculopathy Better since surgery.   15. DDD (degenerative disc disease), lumbar RICE, NSAIDS, exercises given, follow up ortho  16.  Encounter for medicare visit.     Plan:   During the course of the visit the patient was educated and counseled about appropriate screening and preventive services including:    Pneumococcal vaccine   Influenza vaccine  Td vaccine  Screening electrocardiogram  Bone densitometry screening  Colorectal cancer screening  Diabetes screening  Glaucoma screening  Nutrition counseling   Advanced directives: requested  Conditions/risks identified: BMI: Discussed weight loss, diet, and increase physical activity.  Increase physical activity: AHA recommends 150 minutes of physical activity a week.  Medications reviewed Prediabetes is at goal Urinary Incontinence is not an issue: discussed non pharmacology and pharmacology options.  Fall risk: low- discussed PT, home fall assessment, medications.    Subjective:  [name] [name] is a 70 y.o. female who presents for Medicare Annual Wellness Visit and 3 month follow up.  Date of last medicare wellness visit was 06/2014  She has had elevated blood pressure for  years. Her blood pressure has been controlled at home, today their BP is BP: 136/74 mmHg She does not workout regularly but does walk with her dog, and just bought new elliptical. She denies chest pain, shortness of breath, dizziness.  She is on cholesterol medication, lipitor 80mg  1/2 pill every day and denies myalgias. Her cholesterol is at goal. The cholesterol last visit was:   Lab Results  Component Value Date   CHOL 162 03/31/2015   HDL 45* 03/31/2015   LDLCALC 84 03/31/2015   TRIG 163* 03/31/2015   CHOLHDL 3.6 03/31/2015   She has been working on diet and exercise for prediabetes, and denies paresthesia of the feet, polydipsia, polyuria and visual disturbances. Last A1C in the office was:  Lab Results  Component Value Date   HGBA1C 6.2* 03/31/2015  Patient is on Vitamin D supplement.  Lab Results  Component Value Date   VD25OH 48 12/30/2014   Follows with Dr.  [name] for lower back surgery, she states it has improved but still has some muscular pain, no pain down her legs. But she states that she has some left thigh pain, that will spasm/jump at night. She also has bilateral knee pain, left is worse than right and states she will follow up with Dr. [name].  She is on thyroid medication. Her medication was not changed last visit.   Lab Results  Component Value Date   TSH 0.989 03/31/2015  3 years since husband has passed, still has some depression from this, worse around his bday/death date. On cymbalta and handling well.  Follows with Dr. Griffith Citron for left breast cancer, had lumpectomy and radiation.  BMI is Body mass index is 38.81 kg/(m^2)., she is working on diet and exercise. Wt Readings from Last 3 Encounters:  06/23/15 233 lb 3.2 oz (105.779 kg)  03/31/15 226 lb (102.513 kg)  01/13/15 231 lb 6.4 oz (104.962 kg)     Medication Review: Current Outpatient Prescriptions on File Prior to Visit  Medication Sig Dispense Refill  . anastrozole (ARIMIDEX) 1 MG tablet TAKE 1 TABLET BY MOUTH EVERY DAY 90 tablet 3  . atorvastatin (LIPITOR) 80 MG tablet Take 1 tablet (80 mg total) by mouth daily. 90 tablet 3  . Calcium Carbonate-Vitamin D (CALTRATE 600+D) 600-400 MG-UNIT per chew tablet Chew 1 tablet by mouth daily. 180 tablet 4  . Cholecalciferol (VITAMIN D) 2000 UNITS tablet Take 4,000 Units by mouth daily.    . cyclobenzaprine (FLEXERIL) 10 MG tablet Take 10 mg by mouth at bedtime.  1  . doxazosin (CARDURA) 8 MG tablet TAKE 1/2 TABLET(4MG ) BY MOUTH EVERY DAY 45 tablet 3  . DULoxetine (CYMBALTA) 30 MG capsule TAKE 1 CAPSULE BY MOUTH 3 TIMES DAILY 270 capsule 3  . gabapentin (NEURONTIN) 300 MG capsule TAKE ONE CAPSULE BY MOUTH AT BEDTIME 90 capsule 1  . HYDROcodone-acetaminophen (NORCO/VICODIN) 5-325 MG tablet TAKE 1 TO 2 TABLETS BY MOUTH EVERY 6 TO 8 HOURS AS NEEDED FOR PAIN  0  . levothyroxine (SYNTHROID, LEVOTHROID) 50 MCG tablet TAKE 1/2 OR 1  TABLET BY MOUTH EVERY MORNING 30-60 MINS BEFORE BREAKFAST ON AN EMPTY STOMACH 90 tablet 1  . meloxicam (MOBIC) 15 MG tablet TAKE 1/2 TO 1 TABLET DAILY WITH FOOR FOR PAIN *DONT TAKE WITH ALEVE OR IBUPROFEN CAN TAKE TYLENOL* 90 tablet 1  . mometasone (NASONEX) 50 MCG/ACT nasal spray USE 1 TO 2 SPRAYS IN EACH NOSTRIL TWICE DAILY 17 g 99  . ranitidine (ZANTAC) 300 MG tablet TAKE 1 TABLET BY MOUTH TWICE A DAY WHILE TRYING TO GET OFF PPI THEN CAN GO TO ONCE NIGHTLY 180 tablet 1  . sucralfate (CARAFATE) 1 g tablet TAKE 1 TABLET BY MOUTH 4 TIMES A DAY 360 tablet 1  . temazepam (RESTORIL) 30 MG capsule Take 1 capsule at hour of sleep as needed 90 capsule 1  . terbinafine (LAMISIL) 250 MG tablet Take 250 mg by mouth daily. Pt takes this medication alternating months    . traMADol (ULTRAM) 50 MG tablet TAKE 1 TABLET BY MOUTH 4 TIMES DAILY AS NEEDED 120 tablet 0  . verapamil (CALAN-SR) 240 MG CR tablet TAKE 1 TABLET BY MOUTH EVERY DAY WITH FOOD 90 tablet 1   No current facility-administered medications on file prior to visit.    Current Problems (verified) Patient Active Problem List   Diagnosis Date Noted  .  Encounter for Medicare annual wellness exam 03/31/2015  . DDD (degenerative disc disease), lumbar 06/16/2014  . Prediabetes 12/18/2013  . Vitamin D deficiency 12/18/2013  . Medication management 12/18/2013  . Paroxysmal supraventricular tachycardia ([place]) 10/18/2013  . Radiculopathy 10/16/2013  . Obesity (BMI 30-39.9) 06/11/2013  . Cancer of lower-inner quadrant of left female breast ([place]) 05/20/2013  . History of pancreatitis 03/27/2008  . Hyperlipidemia 03/13/2008  . ANXIETY DEPRESSION 03/13/2008  . Essential hypertension 03/13/2008  . H/O left bundle branch block 03/13/2008  . Allergic rhinitis 03/13/2008  . GERD 03/13/2008    Screening Tests Immunization History  Administered Date(s) Administered  . Influenza Whole 01/01/2013  . Influenza, High Dose Seasonal PF 01/29/2014,  12/30/2014  . Pneumococcal Conjugate-13 06/16/2014  . Pneumococcal Polysaccharide-23 05/23/2011  . Tdap 03/11/2008  . Zoster 02/02/2010   Preventative care: Last colonoscopy: 2014 Last mammogram: 05/2015  (05/06/2013 + right breast cancer s/p lumpectomy) Last pap smear/pelvic exam: remote   DEXA: 12/2013- normal Renal US 08/2013 Echo 2008  Prior vaccinations: TD or Tdap: 2009  Influenza: 2016 Pneumococcal: 2013 Prevnar 13: 2016 Shingles/Zostavax: 2011  Names of Other Physician/Practitioners you currently use: 1. [facility] here for primary care 2. Dr [facility], eye doctor, last visit April 2015 3. Dr. Tye Savoy, dentist, last visit q 6 months Patient Care Team: Unk Pinto, MD as PCP - General (Internal Medicine) [name], MD as Referring Physician (Orthopedic Surgery) Chauncey Cruel, MD as Consulting Physician (Oncology) Inda Castle, MD as Consulting Physician (Gastroenterology)  SURGICAL HISTORY Past Surgical History  Procedure Laterality Date  . Cataract extraction bilateral w/ anterior vitrectomy Bilateral   . Abdominal hysterectomy    . Cholecystectomy    . Knee arthroscopy Bilateral   . Tonsillectomy    . Colonoscopy  2014  . Upper gi endoscopy    . Breast surgery      lt lump-neg  . Dupuytren / palmar fasciotomy Bilateral 2007    x 3 to left and once to the right   . Tubal ligation    . Foot surgery Right   . Epidural injections      x 3  . Lumbar fusion  10/2013    Dr. Lynann Bologna   FAMILY HISTORY Family History  Problem Relation Age of Onset  . Colon polyps Neg Hx   . Esophageal cancer Neg Hx   . Rectal cancer Neg Hx   . Stomach cancer Neg Hx   . Heart attack Mother   . Stroke Mother   . COPD Father   . Hypertension Father    SOCIAL HISTORY Social History  Substance Use Topics  . Smoking status: Never Smoker   . Smokeless tobacco: Never Used  . Alcohol Use: Yes     Comment: wine  occasionally-rarely    MEDICARE WELLNESS OBJECTIVES: Tobacco use: She does not smoke.  Patient is not a former smoker. Alcohol Current alcohol use: social drinker Caffeine Current caffeine use: denies use Diet: in general, a "healthy" diet   Physical activity: walking and no regular exercise Fall risk: has  Low Risk Osteoporosis: postmenopausal estrogen deficiency and dietary calcium and/or vitamin D deficiency, History of fracture in the past year: no Depression/mood screen:  Yes - Depression but on cymbalta Hearing: normal Visual acuity: normal,  does perform annual eye exam  ADLs: self care Home safety: excellent Cognitive Testing  Alert? Yes  Normal Appearance?Yes  Oriented to person? Yes  Place? Yes   Time? Yes  Recall of three objects?  Yes  Can perform simple calculations? Yes  Displays appropriate judgment?Yes  Can read the correct time from a watch face?Yes EOL planning: No  and Information given    Objective:     Blood pressure 136/74, pulse 92, temperature 97.3 F (36.3 C), temperature source Temporal, resp. rate 16, height 5\' 5"  (1.651 m), weight 233 lb 3.2 oz (105.779 kg), SpO2 95 %. Body mass index is 38.81 kg/(m^2).  General appearance: alert, no distress, WD/WN, female HEENT: normocephalic, sclerae anicteric, TMs pearly, nares patent, no discharge or erythema, pharynx normal, + maxillary sinus tenderness to palpation.  Oral cavity: MMM, no lesions Neck: supple, no lymphadenopathy, no thyromegaly, no masses Heart: RRR, normal S1, S2, no murmurs Lungs: CTA bilaterally, no wheezes, rhonchi, or rales Abdomen: +bs, soft, obese, + epigastric tenderness, non distended, no masses, no hepatomegaly, no splenomegaly Musculoskeletal: nontender, no swelling, no obvious deformity Extremities: no edema, no cyanosis, no clubbing Pulses: 2+ symmetric, upper and lower extremities, normal cap refill Neurological: alert, oriented x 3, CN2-12 intact, strength normal upper  extremities and lower extremities, sensation normal throughout, DTRs 2+ throughout, no cerebellar signs, gait antalgic Psychiatric: normal affect, behavior normal, pleasant   Medicare Attestation I have personally reviewed: The patient's medical and social history Their use of alcohol, tobacco or illicit drugs Their current medications and supplements The patient's functional ability including ADLs,fall risks, home safety risks, cognitive, and hearing and visual impairment Diet and physical activities Evidence for depression or mood disorders  The patient's weight, height, BMI, and visual acuity have been recorded in the chart.  I have made referrals, counseling, and provided education to the patient based on review of the above and I have provided the patient with a written personalized care plan for preventive services.     Vicie Mutters, PA-C   06/23/2015

## 2015-06-23 NOTE — Patient Instructions (Addendum)

## 2015-06-24 LAB — IRON AND TIBC
%SAT: 8 % — ABNORMAL LOW (ref 11–50)
Iron: 31 ug/dL — ABNORMAL LOW (ref 45–160)
TIBC: 412 ug/dL (ref 250–450)
UIBC: 381 ug/dL (ref 125–400)

## 2015-06-24 LAB — HEMOGLOBIN A1C
HEMOGLOBIN A1C: 6.2 % — AB (ref <5.7)
MEAN PLASMA GLUCOSE: 131 mg/dL — AB (ref <117)

## 2015-06-24 LAB — FERRITIN: FERRITIN: 35 ng/mL (ref 20–288)

## 2015-06-24 LAB — TSH: TSH: 1.1 m[IU]/L

## 2015-06-30 ENCOUNTER — Ambulatory Visit: Payer: Self-pay | Admitting: Physician Assistant

## 2015-07-10 ENCOUNTER — Other Ambulatory Visit: Payer: Self-pay | Admitting: Internal Medicine

## 2015-07-11 DIAGNOSIS — I428 Other cardiomyopathies: Secondary | ICD-10-CM

## 2015-07-11 HISTORY — DX: Other cardiomyopathies: I42.8

## 2015-07-13 ENCOUNTER — Ambulatory Visit (HOSPITAL_BASED_OUTPATIENT_CLINIC_OR_DEPARTMENT_OTHER): Payer: Medicare Other | Admitting: Oncology

## 2015-07-13 ENCOUNTER — Other Ambulatory Visit: Payer: Self-pay | Admitting: *Deleted

## 2015-07-13 ENCOUNTER — Other Ambulatory Visit (HOSPITAL_BASED_OUTPATIENT_CLINIC_OR_DEPARTMENT_OTHER): Payer: Medicare Other

## 2015-07-13 VITALS — BP 136/73 | HR 90 | Temp 98.2°F | Resp 18 | Ht 65.0 in | Wt 227.1 lb

## 2015-07-13 DIAGNOSIS — Z79811 Long term (current) use of aromatase inhibitors: Secondary | ICD-10-CM | POA: Diagnosis not present

## 2015-07-13 DIAGNOSIS — C50312 Malignant neoplasm of lower-inner quadrant of left female breast: Secondary | ICD-10-CM

## 2015-07-13 LAB — CBC WITH DIFFERENTIAL/PLATELET
BASO%: 0.5 % (ref 0.0–2.0)
Basophils Absolute: 0 10e3/uL (ref 0.0–0.1)
EOS%: 2.7 % (ref 0.0–7.0)
Eosinophils Absolute: 0.2 10e3/uL (ref 0.0–0.5)
HCT: 28 % — ABNORMAL LOW (ref 34.8–46.6)
HGB: 9.2 g/dL — ABNORMAL LOW (ref 11.6–15.9)
LYMPH%: 20.2 % (ref 14.0–49.7)
MCH: 27.5 pg (ref 25.1–34.0)
MCHC: 32.9 g/dL (ref 31.5–36.0)
MCV: 83.6 fL (ref 79.5–101.0)
MONO#: 0.7 10e3/uL (ref 0.1–0.9)
MONO%: 9.8 % (ref 0.0–14.0)
NEUT#: 4.9 10e3/uL (ref 1.5–6.5)
NEUT%: 66.8 % (ref 38.4–76.8)
Platelets: 298 10e3/uL (ref 145–400)
RBC: 3.35 10e6/uL — ABNORMAL LOW (ref 3.70–5.45)
RDW: 14.5 % (ref 11.2–14.5)
WBC: 7.3 10e3/uL (ref 3.9–10.3)
lymph#: 1.5 10e3/uL (ref 0.9–3.3)

## 2015-07-13 LAB — COMPREHENSIVE METABOLIC PANEL WITH GFR
ALT: 11 U/L (ref 0–55)
AST: 14 U/L (ref 5–34)
Albumin: 3.5 g/dL (ref 3.5–5.0)
Alkaline Phosphatase: 101 U/L (ref 40–150)
Anion Gap: 8 meq/L (ref 3–11)
BUN: 19 mg/dL (ref 7.0–26.0)
CO2: 26 meq/L (ref 22–29)
Calcium: 9.5 mg/dL (ref 8.4–10.4)
Chloride: 106 meq/L (ref 98–109)
Creatinine: 0.8 mg/dL (ref 0.6–1.1)
EGFR: 73 ml/min/1.73 m2 — ABNORMAL LOW
Glucose: 90 mg/dL (ref 70–140)
Potassium: 3.9 meq/L (ref 3.5–5.1)
Sodium: 140 meq/L (ref 136–145)
Total Bilirubin: <0.3 mg/dL (ref 0.20–1.20)
Total Protein: 7.1 g/dL (ref 6.4–8.3)

## 2015-07-13 MED ORDER — ANASTROZOLE 1 MG PO TABS: 1.0000 mg | ORAL_TABLET | Freq: Every day | ORAL | Status: DC

## 2015-07-13 NOTE — Progress Notes (Signed)
[place]  [phone] [phone]     ID: AMBREEN [name] DOB: [birth year]  MR#: [id number]  SWN#:[id number]  Patient Care Team: Unk Pinto, MD as PCP - General (Internal Medicine) [name] Primas, MD as Referring Physician (Orthopedic Surgery) [name] Cruel, MD as Consulting Physician (Oncology) [name] Castle, MD as Consulting Physician (Gastroenterology) PCP: [name] Richards, MD GYN: SU: Rolm Bookbinder OTHER MD: [name] [name], [name] [name] (ortho)  CHIEF COMPLAINT: Estrogen receptor positive breast cancer  CURRENT TREATMENT: Anastrozole   BREAST CANCER HISTORY: From doctor [name] [name]'s earlier note:  "[name] [name] is a 70 y.o. female with  1. A past medical history significant for anxiety GERD shingles anemia and hypertension. Patient also has a family history significant for breast cancer in a sister. Patient underwent a screening mammogram that showed a breast density category C. She was noted to have a mass in the lower inner quadrant of the left breast with spiculated margins and associated microcalcifications. This measures 10 mm. She had an ultrasound performed that showed irregular hypoechoic mass at the 9:00 position 67 cm from the nipple. This measured 5 x 4 x 5 mm. She had a core needle biopsy performed that revealed invasive lobular carcinoma, grade 2, tumor was estrogen receptor positive progesterone receptor positive HER-2/neu negative with a proliferation marker Ki-67 14%. Patient was seen by Dr. Rolm Bookbinder on 05/22/2013 to discuss surgical treatment options. Since then she underwent a left breast radioactive seed guided lumpectomy and axillary sentinel lymph node biopsy. Her final pathology did reveal a 1.8 cm, grade 1, invasive lobular carcinoma Sentinel node was negative for metastatic disease all margins were clear. Tumor again was ER positive PR positive HER-2/neu negative. She overall tolerated the  procedure well.  2. Patient underwent adjuvant radiation therapy under the care of Dr. Isidore Moos from 07/18/13 through 08/28/13."  Her subsequent history is as detailed below  INTERVAL HISTORY: [name] [name] returns today for follow up of her estrogen receptor positive breast cancer. She takes her anastrozole daily with good tolerance. Hot flashes and vaginal dryness are not major issues for her. She obtains it at a good price.  REVIEW OF SYSTEMS: [place] fell about 2 weeks ago stepping off a curb while carrying a dead squirrel to the dumpster. She injured her left upper arm, but did not break it. She has some pain in her right foot and leg, some ringing in her years, and of course she has a history of anemia. Otherwise a detailed review of systems today was entirely stable  PAST MEDICAL HISTORY: Past Medical History  Diagnosis Date  . Arthritis   . Anxiety   . Hyperlipidemia     takes Atorvastatin 3 times a week  . Shingles   . Anemia   . Allergy     Nasonex daily as needed  . Wears glasses   . PONV (postoperative nausea and vomiting)   . Breast cancer ([place])      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  . S/P radiation therapy 07/18/2013-08/28/2013    1) Left Breast / 50 Gy in 25 fractions/ 2) Left Breast Boost / 10 Gy in 5 fractions  . Hypertension     takes Cardura nightly and Verapamil daily  . History of shingles   . Sinus drainage     put on Levaquin yesterday if no better in 3 days will start prednisone  . Headache(784.0)     rare  . Weakness     tingling  and numbness both hands and left leg  . Joint pain   . Chronic back pain     cyst sitting on L4-5;slipped disc  . GERD (gastroesophageal reflux disease)     takes Omeprazole daily  . History of gastric ulcer at age 26  . Diverticulosis   . Depression     takes Cymbalta daily  . Insomnia     takes Restoril nightly    PAST SURGICAL HISTORY: Past Surgical History  Procedure Laterality Date  . Cataract  extraction bilateral w/ anterior vitrectomy Bilateral   . Abdominal hysterectomy    . Cholecystectomy    . Knee arthroscopy Bilateral   . Tonsillectomy    . Colonoscopy  2014  . Upper gi endoscopy    . Breast surgery      lt lump-neg  . Dupuytren / palmar fasciotomy Bilateral 2007    x 3 to left and once to the right   . Tubal ligation    . Foot surgery Right   . Epidural injections      x 3  . Lumbar fusion  10/2013    Dr. Lynann Bologna    FAMILY HISTORY Family History  Problem Relation Age of Onset  . Colon polyps Neg Hx   . Esophageal cancer Neg Hx   . Rectal cancer Neg Hx   . Stomach cancer Neg Hx   . Heart attack Mother   . Stroke Mother   . COPD Father   . Hypertension Father    the patient's father died at the age of 37 from uremia. The patient's mother died at the age of 55 from a heart attack. The patient has one brother, 5 sisters. One of her sisters was diagnosed with breast cancer in her 70s. There is no history of ovarian cancer in the family  GYNECOLOGIC HISTORY:  No LMP recorded. Patient has had a hysterectomy. Menarche age 53, first live birth age 35. The patient is GX P1. She had a hysterectomy with bilateral salpingo-oophorectomy in her 63s and receive hormone replacement for approximately 30 years after that.  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 Her husband died a few years ago with severe respiratory complications. He had a tracheostomy. She was his primary caregiver. She now lives alone with her femal Educational psychologist. Her son [name] [name] lives in [place]. He does O. designed for [employer]. The patient has one biological and 3 stepgrandchildren. She is a [employer]   ADVANCED DIRECTIVES: Not in place; at the 01/13/2014 visit the patient received the appropriate documents to declare healthcare power of attorney.   HEALTH MAINTENANCE: Social History  Substance Use Topics  . Smoking status: Never  Smoker   . Smokeless tobacco: Never Used  . Alcohol Use: Yes     Comment: wine occasionally-rarely     Colonoscopy:  PAP:  Bone density: 01/07/2014, normal  Lipid panel:  Allergies  Allergen Reactions  . Requip [Ropinirole Hcl] Shortness Of Breath and Nausea And Vomiting  . Codeine Other (See Comments)    fatigue  . Minocycline Hives  . Morphine Sulfate Other (See Comments)    Palpitations  . Tetracyclines & Related Hives    Pt doesn't remember a reaction  . Zestril [Lisinopril] Cough  . Zetia [Ezetimibe] Other (See Comments)    Muscle and joint pain  . Amoxicillin Rash    Pt doesn't remember a reaction  . Sulfa Antibiotics Other (See Comments)  Headache (pt states that a blood pressure medicine caused a severe headache but doesn't remember a reaction to sulfa)    Current Outpatient Prescriptions  Medication Sig Dispense Refill  . anastrozole (ARIMIDEX) 1 MG tablet TAKE 1 TABLET BY MOUTH EVERY DAY 90 tablet 3  . atorvastatin (LIPITOR) 80 MG tablet Take 1 tablet (80 mg total) by mouth daily. 90 tablet 3  . Calcium Carbonate-Vitamin D (CALTRATE 600+D) 600-400 MG-UNIT per chew tablet Chew 1 tablet by mouth daily. 180 tablet 4  . Cholecalciferol (VITAMIN D) 2000 UNITS tablet Take 4,000 Units by mouth daily.    . cyclobenzaprine (FLEXERIL) 10 MG tablet Take 10 mg by mouth at bedtime.  1  . doxazosin (CARDURA) 8 MG tablet Take 1/2 to 1 tablet daily as directed for BP 90 tablet 1  . DULoxetine (CYMBALTA) 30 MG capsule Take 1 capsule (30 mg total) by mouth 3 (three) times daily. 270 capsule 3  . gabapentin (NEURONTIN) 300 MG capsule Take 1 capsule (300 mg total) by mouth at bedtime. 90 capsule 1  . HYDROcodone-acetaminophen (NORCO/VICODIN) 5-325 MG tablet TAKE 1 TO 2 TABLETS BY MOUTH EVERY 6 TO 8 HOURS AS NEEDED FOR PAIN  0  . levothyroxine (SYNTHROID, LEVOTHROID) 50 MCG tablet TAKE 1/2 OR 1 TABLET BY MOUTH EVERY MORNING 30-60 MINS BEFORE BREAKFAST ON AN EMPTY STOMACH 90 tablet 1    . meloxicam (MOBIC) 15 MG tablet TAKE 1/2 TO 1 TABLET DAILY WITH FOOR FOR PAIN *DONT TAKE WITH ALEVE OR IBUPROFEN CAN TAKE TYLENOL* 90 tablet 1  . mometasone (NASONEX) 50 MCG/ACT nasal spray USE 1 TO 2 SPRAYS IN EACH NOSTRIL TWICE DAILY 17 g 99  . ranitidine (ZANTAC) 300 MG tablet TAKE 1 TABLET BY MOUTH TWICE A DAY WHILE TRYING TO GET OFF PPI THEN CAN GO TO ONCE NIGHTLY 180 tablet 1  . sucralfate (CARAFATE) 1 g tablet TAKE 1 TABLET BY MOUTH 4 TIMES A DAY 360 tablet 1  . temazepam (RESTORIL) 30 MG capsule Take 1 capsule at hour of sleep as needed 90 capsule 1  . traMADol (ULTRAM) 50 MG tablet TAKE 1 TABLET BY MOUTH 4 TIMES DAILY AS NEEDED 120 tablet 0  . verapamil (CALAN-SR) 240 MG CR tablet Take 1 tablet (240 mg total) by mouth daily. with food 90 tablet 1   No current facility-administered medications for this visit.    OBJECTIVE: Middle-aged white woman Who appears stated age 69 Vitals:   07/13/15 1429  BP: 136/73  Pulse: 90  Temp: 98.2 F (36.8 C)  Resp: 18     Body mass index is 37.79 kg/(m^2).    ECOG FS:1 - Symptomatic but completely ambulatory  Sclerae unicteric, pupils round and equal Oropharynx clear and moist-- no thrush or other lesions No cervical or supraclavicular adenopathy Lungs no rales or rhonchi Heart regular rate and rhythm Abd soft, obese, nontender, positive bowel sounds MSK no focal spinal tenderness, no upper extremity lymphedema, obvious arthritic changes over both hands Neuro: nonfocal, well oriented, positive affect Breasts: The right breast is unremarkable. The left breast is status post lumpectomy and radiation. There is no evidence of local recurrence. The left axilla is benign.   LAB RESULTS:  CMP     Component Value Date/Time   NA 138 06/23/2015 1456   NA 140 01/13/2015 1438   K 4.0 06/23/2015 1456   K 3.9 01/13/2015 1438   CL 101 06/23/2015 1456   CO2 25 06/23/2015 1456   CO2 25 01/13/2015 1438  GLUCOSE 83 06/23/2015 1456   GLUCOSE  100 01/13/2015 1438   BUN 18 06/23/2015 1456   BUN 14.2 01/13/2015 1438   CREATININE 0.64 06/23/2015 1456   CREATININE 0.7 01/13/2015 1438   CREATININE 0.68 10/20/2013 0915   CALCIUM 9.8 06/23/2015 1456   CALCIUM 9.3 01/13/2015 1438   PROT 6.8 06/23/2015 1456   PROT 7.0 01/13/2015 1438   ALBUMIN 4.0 06/23/2015 1456   ALBUMIN 3.7 01/13/2015 1438   AST 18 06/23/2015 1456   AST 14 01/13/2015 1438   ALT 16 06/23/2015 1456   ALT 14 01/13/2015 1438   ALKPHOS 105 06/23/2015 1456   ALKPHOS 116 01/13/2015 1438   BILITOT 0.3 06/23/2015 1456   BILITOT <0.30 01/13/2015 1438   GFRNONAA >89 06/23/2015 1456   GFRNONAA 89* 10/20/2013 0915   GFRAA >89 06/23/2015 1456   GFRAA >90 10/20/2013 0915    I No results found for: SPEP  Lab Results  Component Value Date   WBC 7.3 07/13/2015   NEUTROABS 4.9 07/13/2015   HGB 9.2* 07/13/2015   HCT 28.0* 07/13/2015   MCV 83.6 07/13/2015   PLT 298 07/13/2015      Chemistry      Component Value Date/Time   NA 138 06/23/2015 1456   NA 140 01/13/2015 1438   K 4.0 06/23/2015 1456   K 3.9 01/13/2015 1438   CL 101 06/23/2015 1456   CO2 25 06/23/2015 1456   CO2 25 01/13/2015 1438   BUN 18 06/23/2015 1456   BUN 14.2 01/13/2015 1438   CREATININE 0.64 06/23/2015 1456   CREATININE 0.7 01/13/2015 1438   CREATININE 0.68 10/20/2013 0915      Component Value Date/Time   CALCIUM 9.8 06/23/2015 1456   CALCIUM 9.3 01/13/2015 1438   ALKPHOS 105 06/23/2015 1456   ALKPHOS 116 01/13/2015 1438   AST 18 06/23/2015 1456   AST 14 01/13/2015 1438   ALT 16 06/23/2015 1456   ALT 14 01/13/2015 1438   BILITOT 0.3 06/23/2015 1456   BILITOT <0.30 01/13/2015 1438       Lab Results  Component Value Date   LABCA2 30 05/31/2013    No components found for: [id number]  No results for input(s): INR in the last 168 hours.  Urinalysis    Component Value Date/Time   COLORURINE YELLOW 06/16/2014 1523   APPEARANCEUR CLEAR 06/16/2014 1523   LABSPEC 1.013  06/16/2014 1523   PHURINE 5.0 06/16/2014 1523   GLUCOSEU NEG 06/16/2014 1523   HGBUR NEG 06/16/2014 1523   BILIRUBINUR NEG 06/16/2014 1523   KETONESUR NEG 06/16/2014 1523   PROTEINUR NEG 06/16/2014 1523   UROBILINOGEN 0.2 06/16/2014 1523   NITRITE NEG 06/16/2014 1523   LEUKOCYTESUR NEG 06/16/2014 1523    STUDIES: CLINICAL DATA: History of left breast cancer status post lumpectomy in 2015. The patient completed radiation therapy.  EXAM: DIGITAL DIAGNOSTIC BILATERAL MAMMOGRAM WITH 3D TOMOSYNTHESIS AND CAD  COMPARISON: Previous exam(s).  ACR Breast Density Category b: There are scattered areas of fibroglandular density.  FINDINGS: There are lumpectomy changes in the far medial left breast. No suspicious mass, nonsurgical distortion, or suspicious microcalcification is identified in either breast to suggest malignancy.  Mammographic images were processed with CAD.  IMPRESSION: No evidence of malignancy in either breast. Lumpectomy changes in the medial left breast.  RECOMMENDATION: Diagnostic mammogram is suggested in 1 year. (Code:[id number]-01Y)  I have discussed the findings and recommendations with the patient. Results were also provided in writing at the conclusion of the visit. If  applicable, a reminder letter will be sent to the patient regarding the next appointment.  BI-RADS CATEGORY 2: Benign.   Electronically Signed  By: Curlene Dolphin M.D.  On: 05/28/2015 14:56   ASSESSMENT: 70 y.o. [place] woman  (1) status post left lumpectomy and sentinel lymph node sampling 06/06/2013 for a pT1c pN0, stage IA invasive lobular carcinoma, grade 1, estrogen receptor and progesterone receptor strongly positive, with no HER-2 amplification, and an MIB-1 of 14%.  (2) Oncotype DX score of 11 predicted a 10 year risk of outside the breast recurrence of 8% of the patient's only local treatment was tamoxifen for 5 years. It also predicted no benefit from  chemotherapy.  (3) adjuvant radiation to the left breast completed 08/28/2013.  (4) started anastrozole June 2015; bone density scan September 2015 was normal (T score positive)  (5) status post back injury requiring posterior lumbar fusion at the L4-5 level  (6) status post remote hysterectomy with bilateral salpingo-oophorectomy  PLAN: Vara is now 2 years out from her definitive surgery with no evidence of disease recurrence. This is very favorable.  She is tolerating the anastrozole well and obtaining an adequate price. The overall plan is to continue this for total of 5 years.  At this point I am comfortable seeing her on a once a year basis and that is what we set up. I refilled her anastrozole.  She knows to call for any problems that may develop before her next visit. [name] Cruel, MD   07/13/2015 2:41 PM

## 2015-07-14 ENCOUNTER — Telehealth: Payer: Self-pay | Admitting: Oncology

## 2015-07-14 NOTE — Telephone Encounter (Signed)
Left vm to inform pt of one year fu appt 2018

## 2015-07-21 DIAGNOSIS — M25561 Pain in right knee: Secondary | ICD-10-CM | POA: Diagnosis not present

## 2015-07-21 DIAGNOSIS — M79671 Pain in right foot: Secondary | ICD-10-CM | POA: Diagnosis not present

## 2015-07-21 DIAGNOSIS — M1711 Unilateral primary osteoarthritis, right knee: Secondary | ICD-10-CM | POA: Diagnosis not present

## 2015-07-29 ENCOUNTER — Other Ambulatory Visit: Payer: Self-pay | Admitting: Internal Medicine

## 2015-08-04 ENCOUNTER — Ambulatory Visit: Payer: Self-pay | Admitting: Physician Assistant

## 2015-08-06 ENCOUNTER — Ambulatory Visit: Payer: Self-pay | Admitting: Internal Medicine

## 2015-08-06 ENCOUNTER — Encounter: Payer: Self-pay | Admitting: Internal Medicine

## 2015-08-06 ENCOUNTER — Ambulatory Visit (INDEPENDENT_AMBULATORY_CARE_PROVIDER_SITE_OTHER): Payer: Medicare Other | Admitting: Internal Medicine

## 2015-08-06 ENCOUNTER — Other Ambulatory Visit (HOSPITAL_COMMUNITY): Payer: Medicare Other

## 2015-08-06 ENCOUNTER — Emergency Department (HOSPITAL_COMMUNITY): Payer: Medicare Other

## 2015-08-06 ENCOUNTER — Inpatient Hospital Stay (HOSPITAL_COMMUNITY)
Admission: EM | Admit: 2015-08-06 | Discharge: 2015-08-11 | DRG: 286 | Disposition: A | Discharge status: Home / Self Care | Payer: Medicare Other | Attending: Internal Medicine | Admitting: Internal Medicine

## 2015-08-06 ENCOUNTER — Encounter (HOSPITAL_COMMUNITY): Payer: Self-pay | Admitting: *Deleted

## 2015-08-06 ENCOUNTER — Observation Stay (HOSPITAL_COMMUNITY): Payer: Medicare Other

## 2015-08-06 VITALS — BP 144/72 | HR 88 | Temp 98.0°F | Resp 16 | Ht 65.0 in | Wt 224.0 lb

## 2015-08-06 DIAGNOSIS — Z886 Allergy status to analgesic agent status: Secondary | ICD-10-CM

## 2015-08-06 DIAGNOSIS — Z79899 Other long term (current) drug therapy: Secondary | ICD-10-CM | POA: Diagnosis not present

## 2015-08-06 DIAGNOSIS — R0600 Dyspnea, unspecified: Secondary | ICD-10-CM | POA: Diagnosis not present

## 2015-08-06 DIAGNOSIS — R079 Chest pain, unspecified: Secondary | ICD-10-CM | POA: Diagnosis not present

## 2015-08-06 DIAGNOSIS — K219 Gastro-esophageal reflux disease without esophagitis: Secondary | ICD-10-CM | POA: Diagnosis not present

## 2015-08-06 DIAGNOSIS — Z87898 Personal history of other specified conditions: Secondary | ICD-10-CM

## 2015-08-06 DIAGNOSIS — I313 Pericardial effusion (noninflammatory): Secondary | ICD-10-CM | POA: Diagnosis present

## 2015-08-06 DIAGNOSIS — I5042 Chronic combined systolic (congestive) and diastolic (congestive) heart failure: Secondary | ICD-10-CM | POA: Diagnosis present

## 2015-08-06 DIAGNOSIS — R0789 Other chest pain: Secondary | ICD-10-CM | POA: Diagnosis not present

## 2015-08-06 DIAGNOSIS — I5021 Acute systolic (congestive) heart failure: Secondary | ICD-10-CM | POA: Diagnosis not present

## 2015-08-06 DIAGNOSIS — E039 Hypothyroidism, unspecified: Secondary | ICD-10-CM | POA: Diagnosis not present

## 2015-08-06 DIAGNOSIS — M1990 Unspecified osteoarthritis, unspecified site: Secondary | ICD-10-CM | POA: Diagnosis not present

## 2015-08-06 DIAGNOSIS — D649 Anemia, unspecified: Secondary | ICD-10-CM | POA: Diagnosis present

## 2015-08-06 DIAGNOSIS — I471 Supraventricular tachycardia: Secondary | ICD-10-CM | POA: Diagnosis not present

## 2015-08-06 DIAGNOSIS — Z853 Personal history of malignant neoplasm of breast: Secondary | ICD-10-CM

## 2015-08-06 DIAGNOSIS — I428 Other cardiomyopathies: Secondary | ICD-10-CM | POA: Insufficient documentation

## 2015-08-06 DIAGNOSIS — Z9842 Cataract extraction status, left eye: Secondary | ICD-10-CM | POA: Diagnosis not present

## 2015-08-06 DIAGNOSIS — Z8679 Personal history of other diseases of the circulatory system: Secondary | ICD-10-CM

## 2015-08-06 DIAGNOSIS — Z9841 Cataract extraction status, right eye: Secondary | ICD-10-CM

## 2015-08-06 DIAGNOSIS — I420 Dilated cardiomyopathy: Secondary | ICD-10-CM | POA: Diagnosis present

## 2015-08-06 DIAGNOSIS — Z881 Allergy status to other antibiotic agents status: Secondary | ICD-10-CM

## 2015-08-06 DIAGNOSIS — I447 Left bundle-branch block, unspecified: Secondary | ICD-10-CM

## 2015-08-06 DIAGNOSIS — I480 Paroxysmal atrial fibrillation: Secondary | ICD-10-CM

## 2015-08-06 DIAGNOSIS — Z8711 Personal history of peptic ulcer disease: Secondary | ICD-10-CM | POA: Diagnosis not present

## 2015-08-06 DIAGNOSIS — Z6837 Body mass index (BMI) 37.0-37.9, adult: Secondary | ICD-10-CM

## 2015-08-06 DIAGNOSIS — E782 Mixed hyperlipidemia: Secondary | ICD-10-CM | POA: Diagnosis present

## 2015-08-06 DIAGNOSIS — Z981 Arthrodesis status: Secondary | ICD-10-CM | POA: Diagnosis not present

## 2015-08-06 DIAGNOSIS — Z923 Personal history of irradiation: Secondary | ICD-10-CM

## 2015-08-06 DIAGNOSIS — G4700 Insomnia, unspecified: Secondary | ICD-10-CM | POA: Diagnosis present

## 2015-08-06 DIAGNOSIS — D638 Anemia in other chronic diseases classified elsewhere: Secondary | ICD-10-CM | POA: Diagnosis present

## 2015-08-06 DIAGNOSIS — I10 Essential (primary) hypertension: Secondary | ICD-10-CM | POA: Diagnosis present

## 2015-08-06 DIAGNOSIS — F329 Major depressive disorder, single episode, unspecified: Secondary | ICD-10-CM | POA: Diagnosis present

## 2015-08-06 DIAGNOSIS — Z888 Allergy status to other drugs, medicaments and biological substances status: Secondary | ICD-10-CM

## 2015-08-06 DIAGNOSIS — J189 Pneumonia, unspecified organism: Secondary | ICD-10-CM | POA: Diagnosis not present

## 2015-08-06 DIAGNOSIS — Z8249 Family history of ischemic heart disease and other diseases of the circulatory system: Secondary | ICD-10-CM

## 2015-08-06 DIAGNOSIS — F419 Anxiety disorder, unspecified: Secondary | ICD-10-CM | POA: Diagnosis present

## 2015-08-06 DIAGNOSIS — I429 Cardiomyopathy, unspecified: Secondary | ICD-10-CM | POA: Diagnosis not present

## 2015-08-06 DIAGNOSIS — I110 Hypertensive heart disease with heart failure: Principal | ICD-10-CM | POA: Diagnosis present

## 2015-08-06 DIAGNOSIS — E785 Hyperlipidemia, unspecified: Secondary | ICD-10-CM | POA: Diagnosis not present

## 2015-08-06 DIAGNOSIS — E669 Obesity, unspecified: Secondary | ICD-10-CM | POA: Diagnosis present

## 2015-08-06 DIAGNOSIS — E038 Other specified hypothyroidism: Secondary | ICD-10-CM | POA: Diagnosis not present

## 2015-08-06 DIAGNOSIS — R6884 Jaw pain: Secondary | ICD-10-CM | POA: Diagnosis not present

## 2015-08-06 DIAGNOSIS — I5030 Unspecified diastolic (congestive) heart failure: Secondary | ICD-10-CM | POA: Diagnosis present

## 2015-08-06 HISTORY — DX: Disorder of thyroid, unspecified: E07.9

## 2015-08-06 HISTORY — DX: Personal history of other specified conditions: Z87.898

## 2015-08-06 HISTORY — DX: Paroxysmal atrial fibrillation: I48.0

## 2015-08-06 LAB — CBC
HCT: 25.6 % — ABNORMAL LOW (ref 36.0–46.0)
HEMOGLOBIN: 8.2 g/dL — AB (ref 12.0–15.0)
MCH: 26.5 pg (ref 26.0–34.0)
MCHC: 32 g/dL (ref 30.0–36.0)
MCV: 82.6 fL (ref 78.0–100.0)
Platelets: 332 10*3/uL (ref 150–400)
RBC: 3.1 MIL/uL — ABNORMAL LOW (ref 3.87–5.11)
RDW: 15.1 % (ref 11.5–15.5)
WBC: 8.3 10*3/uL (ref 4.0–10.5)

## 2015-08-06 LAB — IRON AND TIBC
IRON: 12 ug/dL — AB (ref 28–170)
SATURATION RATIOS: 4 % — AB (ref 10.4–31.8)
TIBC: 342 ug/dL (ref 250–450)
UIBC: 330 ug/dL

## 2015-08-06 LAB — BASIC METABOLIC PANEL
ANION GAP: 11 (ref 5–15)
BUN: 13 mg/dL (ref 6–20)
CALCIUM: 9.1 mg/dL (ref 8.9–10.3)
CO2: 23 mmol/L (ref 22–32)
CREATININE: 0.79 mg/dL (ref 0.44–1.00)
Chloride: 104 mmol/L (ref 101–111)
GLUCOSE: 88 mg/dL (ref 65–99)
Potassium: 3.6 mmol/L (ref 3.5–5.1)
Sodium: 138 mmol/L (ref 135–145)

## 2015-08-06 LAB — FERRITIN: FERRITIN: 174 ng/mL (ref 11–307)

## 2015-08-06 LAB — D-DIMER, QUANTITATIVE: D-Dimer, Quant: 4.42 ug/mL-FEU — ABNORMAL HIGH (ref 0.00–0.50)

## 2015-08-06 LAB — TROPONIN I
Troponin I: <0.03 ng/mL (ref <0.031)
Troponin I: <0.03 ng/mL (ref <0.031)

## 2015-08-06 LAB — I-STAT TROPONIN, ED: TROPONIN I, POC: 0 ng/mL (ref 0.00–0.08)

## 2015-08-06 LAB — POC OCCULT BLOOD, ED: Fecal Occult Bld: NEGATIVE

## 2015-08-06 MED ORDER — IOPAMIDOL (ISOVUE-370) INJECTION 76%
INTRAVENOUS | Status: AC
  Administered 2015-08-06: 90 mL
  Filled 2015-08-06: qty 100

## 2015-08-06 MED ORDER — TRAMADOL HCL 50 MG PO TABS: 50.0000 mg | ORAL_TABLET | Freq: Four times a day (QID) | ORAL | Status: DC | PRN

## 2015-08-06 MED ORDER — ATORVASTATIN CALCIUM 40 MG PO TABS
40.0000 mg | ORAL_TABLET | Freq: Every day | ORAL | Status: DC
  Administered 2015-08-07 – 2015-08-10 (×4): 40 mg via ORAL
  Filled 2015-08-06 (×4): qty 1

## 2015-08-06 MED ORDER — HYDROCODONE-ACETAMINOPHEN 5-325 MG PO TABS
1.0000 | ORAL_TABLET | Freq: Four times a day (QID) | ORAL | Status: DC | PRN
  Administered 2015-08-07: 1 via ORAL
  Administered 2015-08-07: 2 via ORAL
  Administered 2015-08-08 – 2015-08-09 (×3): 1 via ORAL
  Filled 2015-08-06 (×5): qty 1
  Filled 2015-08-06: qty 2

## 2015-08-06 MED ORDER — FAMOTIDINE 20 MG PO TABS
20.0000 mg | ORAL_TABLET | Freq: Every day | ORAL | Status: DC
  Administered 2015-08-06 – 2015-08-10 (×5): 20 mg via ORAL
  Filled 2015-08-06 (×5): qty 1

## 2015-08-06 MED ORDER — ONDANSETRON HCL 4 MG/2ML IJ SOLN: 4.0000 mg | Freq: Four times a day (QID) | INTRAMUSCULAR | Status: DC | PRN

## 2015-08-06 MED ORDER — LEVOTHYROXINE SODIUM 50 MCG PO TABS
50.0000 ug | ORAL_TABLET | Freq: Every day | ORAL | Status: DC
  Administered 2015-08-07 – 2015-08-11 (×5): 50 ug via ORAL
  Filled 2015-08-06 (×5): qty 1

## 2015-08-06 MED ORDER — ACETAMINOPHEN 325 MG PO TABS
650.0000 mg | ORAL_TABLET | ORAL | Status: DC | PRN
  Administered 2015-08-07: 650 mg via ORAL
  Filled 2015-08-06: qty 2

## 2015-08-06 MED ORDER — TEMAZEPAM 15 MG PO CAPS
30.0000 mg | ORAL_CAPSULE | Freq: Every evening | ORAL | Status: DC | PRN
  Administered 2015-08-08 – 2015-08-10 (×4): 30 mg via ORAL
  Filled 2015-08-06 (×4): qty 2

## 2015-08-06 MED ORDER — GABAPENTIN 300 MG PO CAPS
300.0000 mg | ORAL_CAPSULE | Freq: Every day | ORAL | Status: DC
  Administered 2015-08-06 – 2015-08-10 (×5): 300 mg via ORAL
  Filled 2015-08-06 (×5): qty 1

## 2015-08-06 MED ORDER — HYDROCODONE-ACETAMINOPHEN 5-325 MG PO TABS
1.0000 | ORAL_TABLET | Freq: Four times a day (QID) | ORAL | Status: DC | PRN
  Administered 2015-08-06: 1 via ORAL
  Filled 2015-08-06: qty 1

## 2015-08-06 MED ORDER — ANASTROZOLE 1 MG PO TABS
1.0000 mg | ORAL_TABLET | Freq: Every day | ORAL | Status: DC
  Administered 2015-08-07 – 2015-08-11 (×5): 1 mg via ORAL
  Filled 2015-08-06 (×5): qty 1

## 2015-08-06 MED ORDER — ATORVASTATIN CALCIUM 40 MG PO TABS
40.0000 mg | ORAL_TABLET | Freq: Every day | ORAL | Status: DC
Start: 2015-08-06 — End: 2015-08-06

## 2015-08-06 MED ORDER — SUCRALFATE 1 G PO TABS
1.0000 g | ORAL_TABLET | Freq: Four times a day (QID) | ORAL | Status: DC
Start: 2015-08-06 — End: 2015-08-11
  Administered 2015-08-06 – 2015-08-11 (×18): 1 g via ORAL
  Filled 2015-08-06 (×18): qty 1

## 2015-08-06 MED ORDER — CYCLOBENZAPRINE HCL 10 MG PO TABS
10.0000 mg | ORAL_TABLET | Freq: Every day | ORAL | Status: DC
  Administered 2015-08-06 – 2015-08-10 (×5): 10 mg via ORAL
  Filled 2015-08-06 (×5): qty 1

## 2015-08-06 MED ORDER — NITROGLYCERIN 0.4 MG SL SUBL: 0.4000 mg | SUBLINGUAL_TABLET | SUBLINGUAL | Status: DC | PRN

## 2015-08-06 MED ORDER — ENOXAPARIN SODIUM 40 MG/0.4ML ~~LOC~~ SOLN
40.0000 mg | SUBCUTANEOUS | Status: DC
  Administered 2015-08-06 – 2015-08-09 (×4): 40 mg via SUBCUTANEOUS
  Filled 2015-08-06 (×4): qty 0.4

## 2015-08-06 MED ORDER — VERAPAMIL HCL ER 240 MG PO TBCR
240.0000 mg | EXTENDED_RELEASE_TABLET | Freq: Every day | ORAL | Status: DC
Start: 2015-08-07 — End: 2015-08-07
  Administered 2015-08-07: 240 mg via ORAL
  Filled 2015-08-06: qty 1

## 2015-08-06 MED ORDER — MELOXICAM 7.5 MG PO TABS
7.5000 mg | ORAL_TABLET | Freq: Every day | ORAL | Status: DC
  Administered 2015-08-07 – 2015-08-10 (×4): 7.5 mg via ORAL
  Filled 2015-08-06 (×4): qty 1

## 2015-08-06 MED ORDER — DULOXETINE HCL 30 MG PO CPEP
30.0000 mg | ORAL_CAPSULE | Freq: Three times a day (TID) | ORAL | Status: DC
  Administered 2015-08-06 – 2015-08-11 (×14): 30 mg via ORAL
  Filled 2015-08-06 (×14): qty 1

## 2015-08-06 MED ORDER — FLUTICASONE PROPIONATE 50 MCG/ACT NA SUSP
1.0000 | Freq: Every day | NASAL | Status: DC
Start: 2015-08-07 — End: 2015-08-11
  Administered 2015-08-07 – 2015-08-11 (×5): 1 via NASAL
  Filled 2015-08-06 (×2): qty 16

## 2015-08-06 MED ORDER — ASPIRIN 325 MG PO TABS
325.0000 mg | ORAL_TABLET | Freq: Once | ORAL | Status: AC
  Administered 2015-08-06: 325 mg via ORAL
  Filled 2015-08-06: qty 1

## 2015-08-06 NOTE — ED Provider Notes (Signed)
CSN: [id number]     Arrival date & time 08/06/15  1117 History   First MD Initiated Contact with Patient 08/06/15 1253     Chief Complaint  Patient presents with  . Chest Pain     (Consider location/radiation/quality/duration/timing/severity/associated sxs/prior Treatment) HPI   70 year old female with history of breast cancer status post radiation, recurrent weakness, GERD, thyroid disease presenting for evaluation of chest pain. He states for the past week she has had intermittent chest discomfort. She described discomfort as a burning sensation to her mid chest lasting for minutes and recurred hourly. Associate with chest discomfort as a sensation of lightheadedness, dizziness, chills, and associate nausea. For the past several days she also report feeling weak and having difficulty with ambulating due to weakness. She also report having pain that now radiates towards her left arm and wraps around to her back. She denies having fever, URI symptoms, SOB, productive cough, hemoptysis, abdominal pain, dysuria, focal numbness. She has history of GERD in the past but states that this pain is different. She also has history of shingle but does not think that this is related to her shingles and she has not noticed any rash. She report that her last cardiac stress test is more than 10 years ago. Today she was seen at her PCPs office for complaint was sent to the ED for further evaluation of her chest pain.  Past Medical History  Diagnosis Date  . Arthritis   . Anxiety   . Hyperlipidemia     takes Atorvastatin 3 times a week  . Shingles   . Anemia   . Allergy     Nasonex daily as needed  . Wears glasses   . PONV (postoperative nausea and vomiting)   . Breast cancer ([place])      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  . S/P radiation therapy 07/18/2013-08/28/2013    1) Left Breast / 50 Gy in 25 fractions/ 2) Left Breast Boost / 10 Gy in 5 fractions  . Hypertension     takes  Cardura nightly and Verapamil daily  . History of shingles   . Sinus drainage     put on Levaquin yesterday if no better in 3 days will start prednisone  . Headache(784.0)     rare  . Weakness     tingling and numbness both hands and left leg  . Joint pain   . Chronic back pain     cyst sitting on L4-5;slipped disc  . GERD (gastroesophageal reflux disease)     takes Omeprazole daily  . History of gastric ulcer at age 60  . Diverticulosis   . Depression     takes Cymbalta daily  . Insomnia     takes Restoril nightly  . Thyroid disease    Past Surgical History  Procedure Laterality Date  . Cataract extraction bilateral w/ anterior vitrectomy Bilateral   . Abdominal hysterectomy    . Cholecystectomy    . Knee arthroscopy Bilateral   . Tonsillectomy    . Colonoscopy  2014  . Upper gi endoscopy    . Breast surgery      lt lump-neg  . Dupuytren / palmar fasciotomy Bilateral 2007    x 3 to left and once to the right   . Tubal ligation    . Foot surgery Right   . Epidural injections      x 3  . Lumbar fusion  10/2013    Dr. Lynann Bologna  Family History  Problem Relation Age of Onset  . Colon polyps Neg Hx   . Esophageal cancer Neg Hx   . Rectal cancer Neg Hx   . Stomach cancer Neg Hx   . Heart attack Mother   . Stroke Mother   . COPD Father   . Hypertension Father    Social History  Substance Use Topics  . Smoking status: Never Smoker   . Smokeless tobacco: Never Used  . Alcohol Use: Yes     Comment: wine occasionally-rarely   OB History    No data available     Review of Systems  All other systems reviewed and are negative.     Allergies  Requip; Codeine; Minocycline; Morphine sulfate; Tetracyclines & related; Zestril; Zetia; Amoxicillin; and Sulfa antibiotics  Home Medications   Prior to Admission medications   Medication Sig Start Date End Date Taking? Authorizing Provider  anastrozole (ARIMIDEX) 1 MG tablet Take 1 tablet (1 mg total) by mouth  daily. 07/13/15   Chauncey Cruel, MD  atorvastatin (LIPITOR) 80 MG tablet Take 1 tablet (80 mg total) by mouth daily. 02/27/14   Unk Pinto, MD  Calcium Carbonate-Vitamin D (CALTRATE 600+D) 600-400 MG-UNIT per chew tablet Chew 1 tablet by mouth daily. 01/13/14   Chauncey Cruel, MD  Cholecalciferol (VITAMIN D) 2000 UNITS tablet Take 4,000 Units by mouth daily.    Historical Provider, MD  cyclobenzaprine (FLEXERIL) 10 MG tablet Take 10 mg by mouth at bedtime. 09/06/14   Historical Provider, MD  doxazosin (CARDURA) 8 MG tablet Take 1/2 to 1 tablet daily as directed for BP 07/10/15   Unk Pinto, MD  DULoxetine (CYMBALTA) 30 MG capsule Take 1 capsule (30 mg total) by mouth 3 (three) times daily. 06/23/15   Vicie Mutters, PA-C  gabapentin (NEURONTIN) 300 MG capsule Take 1 capsule (300 mg total) by mouth at bedtime. 06/23/15   Vicie Mutters, PA-C  HYDROcodone-acetaminophen (NORCO/VICODIN) 5-325 MG tablet TAKE 1 TO 2 TABLETS BY MOUTH EVERY 6 TO 8 HOURS AS NEEDED FOR PAIN 02/18/15   Historical Provider, MD  levothyroxine (SYNTHROID, LEVOTHROID) 50 MCG tablet TAKE 1/2 OR 1 TABLET BY MOUTH EVERY MORNING 30-60 MINS BEFORE BREAKFAST ON AN EMPTY STOMACH 06/23/15   Vicie Mutters, PA-C  levothyroxine (SYNTHROID, LEVOTHROID) 50 MCG tablet TAKE 1/2 OR 1 TABLET BY MOUTH EVERY MORNING 30-60 MINS BEFORE BREAKFAST ON AN EMPTY STOMACH 07/29/15   Unk Pinto, MD  meloxicam (MOBIC) 15 MG tablet TAKE 1/2 TO 1 TABLET DAILY WITH FOOR FOR PAIN *DONT TAKE WITH ALEVE OR IBUPROFEN CAN TAKE TYLENOL* 06/23/15   Vicie Mutters, PA-C  mometasone (NASONEX) 50 MCG/ACT nasal spray USE 1 TO 2 SPRAYS IN EACH NOSTRIL TWICE DAILY 06/23/15   Vicie Mutters, PA-C  ranitidine (ZANTAC) 300 MG tablet TAKE 1 TABLET BY MOUTH TWICE A DAY WHILE TRYING TO GET OFF PPI THEN CAN GO TO ONCE NIGHTLY 06/23/15   Vicie Mutters, PA-C  sucralfate (CARAFATE) 1 g tablet TAKE 1 TABLET BY MOUTH 4 TIMES A DAY 04/26/15   Unk Pinto, MD  temazepam  (RESTORIL) 30 MG capsule Take 1 capsule at hour of sleep as needed 06/23/15 12/21/15  Vicie Mutters, PA-C  traMADol (ULTRAM) 50 MG tablet TAKE 1 TABLET BY MOUTH 4 TIMES DAILY AS NEEDED 03/31/15   Vicie Mutters, PA-C  verapamil (CALAN-SR) 240 MG CR tablet Take 1 tablet (240 mg total) by mouth daily. with food 06/23/15   Vicie Mutters, PA-C   BP 139/60 mmHg  Pulse 76  Temp(Src) 98.9 F (37.2 C) (Oral)  Resp 18  SpO2 97% Physical Exam  Constitutional: She appears well-developed and well-nourished. No distress.  Awake, alert, nontoxic appearance  HENT:  Head: Atraumatic.  Eyes: Conjunctivae are normal. Right eye exhibits no discharge. Left eye exhibits no discharge.  Neck: Neck supple.  Cardiovascular: Normal rate and regular rhythm.   Pulmonary/Chest: Effort normal. No respiratory distress. She exhibits no tenderness.  No signs of shingle rash.   Abdominal: Soft. There is no tenderness. There is no rebound.  Musculoskeletal: She exhibits no tenderness.  ROM appears intact, no obvious focal weakness.  5/5 strength to all 4 extremities.  Neurological:  Mental status and motor strength appears intact  Skin: No rash noted.  Psychiatric: She has a normal mood and affect.  Nursing note and vitals reviewed.   ED Course  Procedures (including critical care time) Labs Review Labs Reviewed  CBC - Abnormal; Notable for the following:    RBC 3.10 (*)    Hemoglobin 8.2 (*)    HCT 25.6 (*)    All other components within normal limits  BASIC METABOLIC PANEL  I-STAT TROPOININ, ED  POC OCCULT BLOOD, ED    Imaging Review Dg Chest 2 View  08/06/2015  CLINICAL DATA:  Left breast and left arm pain. Left jaw pain with dizziness and nausea for 1 week. EXAM: CHEST  2 VIEW COMPARISON:  10/19/2013.  CT chest 10/18/2013. FINDINGS: Trachea is midline. Heart size stable. There may be mild interstitial prominence and indistinctness. No pleural fluid. IMPRESSION: Question mild pulmonary edema.  Electronically Signed   By: Lorin Picket M.D.   On: 08/06/2015 12:33   I have personally reviewed and evaluated these images and lab results as part of my medical decision-making.   EKG Interpretation   Date/Time:  Thursday August 06 2015 11:21:43 EDT Ventricular Rate:  82 PR Interval:    QRS Duration: 152 QT Interval:  414 QTC Calculation: 483 R Axis:   -33 Text Interpretation:  Atrial fibrillation with a competing junctional  pacemaker Left axis deviation Left bundle branch block Abnormal ECG  Morphologic changes in V1-V3 Confirmed by NGUYEN, EMILY ([id number]) on  08/06/2015 1:13:26 PM      MDM   Final diagnoses:  Intermittent chest pain  Anemia, unspecified anemia type    BP 144/72 mmHg  Pulse 73  Temp(Src) 98.9 F (37.2 C) (Oral)  Resp 25  SpO2 96%   2:26 PM Patient here with intermittent central chest pain and now pain radiates to her left chest and left arm. When she initially presented ED she reported having a left hand pain but now her pain has resolved. Her EKG shows nonspecific changes but not significantly different from prior EKG.  Questionable new onset afib. She has not had a cardiac stress test in more than a decade. She was sent here by her PCP for further evaluation of her chest pain. She has a negative troponin, chest x-ray shows question mild pulmonary edema, evidence of anemia with a hemoglobin of 8.2, hemoccult negative.  Pt with hx of anemia.  Pt is hemodynamically stable.  I have low suspicion for AAA or dissection causing her pain.  Doubt PE  2:37 PM Appreciate consultation from [place] who agrees to see pt in the ER and will admit to telemetry floor, with observation status.    Domenic Moras, PA-C 08/06/15 1443  Harvel Quale, MD 08/21/15 [phone]

## 2015-08-06 NOTE — ED Notes (Signed)
Pt reports indigestion that started 1 week ago. Pt states that it then progressed to central chest pain that radiated to her back left arm and jaw. Pt reports having nausea and generalized weakness.

## 2015-08-06 NOTE — Progress Notes (Signed)
CTA of chest was cancelled and D-dimer ordered instead. Ddimer is elevated, will proceed with CTA of chest to rule out PE in this patient with CP and dyspnea taking Arimidex  Tye Savoy, NP

## 2015-08-06 NOTE — Progress Notes (Signed)
   Subjective:    Patient ID: [name] [name], female    DOB: 08-Jul-1945, 70 y.o.   MRN: [id number]  HPI  Patient presents to the office for evaluation of left sided chest pain x 1 week.  She reports that the chest pain is like a burning sensation that radiates to the back and also sometimes down the left arm with occasional jaw pain.  She has had nausea as well.  She did vomit last week, but has not vomited yet this week.  She cannot walk up and down the stairs without extreme fatigue and also shortness of breath.  Her coworker brought her here today.  She has not had any recent palpitations despite her history of SVT.  She has had normal BP at home.  She has been taking her cholesterol medications and also BP medications.  Mother passed from an MI.  She is not a current smoker, she is a never smoker.  She was brought today by a Mudlogger.     Review of Systems  Constitutional: Positive for fatigue. Negative for fever and chills.  Respiratory: Positive for shortness of breath. Negative for cough, chest tightness and wheezing.        Negative for PND and orthopnea  Cardiovascular: Positive for chest pain. Negative for palpitations and leg swelling.  Gastrointestinal: Positive for nausea, vomiting and abdominal pain.  Genitourinary: Negative.   Neurological: Positive for weakness and light-headedness. Negative for speech difficulty and headaches.       Objective:   Physical Exam  Constitutional: She is oriented to person, place, and time. She appears well-developed and well-nourished. No distress.  HENT:  Head: Normocephalic.  Mouth/Throat: Oropharynx is clear and moist. No oropharyngeal exudate.  Eyes: Conjunctivae and EOM are normal. Pupils are equal, round, and reactive to light. No scleral icterus.  Neck: Normal range of motion. Neck supple. No JVD present. No thyromegaly present.  Cardiovascular: Normal rate, regular rhythm, normal heart sounds and intact distal pulses.  Exam reveals  no gallop and no friction rub.   No murmur heard. Pulmonary/Chest: Effort normal and breath sounds normal. No respiratory distress. She has no wheezes. She has no rales. She exhibits tenderness (Patient reports that chest tenderness is different than pain she is experiencing).  Abdominal: Soft. Bowel sounds are normal. She exhibits no distension and no mass. There is tenderness. There is no rebound and no guarding.  Musculoskeletal: Normal range of motion.  Lymphadenopathy:    She has no cervical adenopathy.  Neurological: She is alert and oriented to person, place, and time. She has normal strength. No cranial nerve deficit or sensory deficit. Coordination normal.  Skin: Skin is warm and dry. She is not diaphoretic.  Psychiatric: She has a normal mood and affect. Her behavior is normal. Judgment and thought content normal.  Nursing note and vitals reviewed.   Filed Vitals:   08/06/15 1012  BP: 144/72  Pulse: 88  Temp: 98 F (36.7 C)  Resp: 16         Assessment & Plan:    1. Chest pain, unspecified -Story concerning for ACS.  EKG without significant change, but history concerning.  She does have risk factors including HTN, HL, Obesity, age greater 38, history of LBBB and SVT.  She does have a heart score of at least 6.  Feel that she likely needs cbc, cmp, cxr, and at least cycled troponins.  Will send via private vehicle with a coworker.

## 2015-08-06 NOTE — H&P (Signed)
History and Physical    [name] [name] [id number] DOB: [birth year] DOA: 08/06/2015  Referring MD/NP/PA: EDP - Domenic Moras, P.A PCP: Alesia Richards, MD  Outpatient Specialists: Lurline Del, MD - Oncology Patient coming from: PCP's office  Chief Complaint: chest pain and shortness of breath.   HPI: [name] [name] is a 70 y.o. female with medical history significant for, but not limited to PUD 2009, left breast cancer, hyperlipidemia and thyroid disease. Patient at PCPs office today with complaints of chest pain, she was sent to ED for further evaluation. For one week patient has had intermittent left-sided chest pain radiating down left arm and occasionally into left jaw.  Pain worse with exertion and without any relationship to food.  Patient also complains of dyspnea with exertion over the last week. She is nearly unable to climb the stairs in her home which she has been climbing for 30 years without difficulty. She does admit to a cough, nonproductive and present for the last week. Patient has a history of GERD, recurrent chest pain is not typical of her GERD symptoms. Patient does take daily GERD medication. She has no prior history of such chest pain.   ED Course:  EKG - atrial fibrillation Questionable mild pulmonary edema on chest x-ray Point-of-care troponin normal Chemistry normal White count normal. Hemoglobin 8.2, down from baseline of 9-10 range  Review of Systems: As per HPI ,otherwise 10 point review of systems negative.   Past Medical History  Diagnosis Date  . Arthritis   . Anxiety   . Hyperlipidemia     takes Atorvastatin 3 times a week  . Shingles   . Anemia   . Allergy     Nasonex daily as needed  . Wears glasses   . PONV (postoperative nausea and vomiting)   . 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 S/P radiation therapy 07/18/2013-08/28/2013    1) Left Breast / 50 Gy in 25 fractions/ 2) Left  Breast Boost / 10 Gy in 5 fractions  . Hypertension     takes Cardura nightly and Verapamil daily  . History of shingles   . Sinus drainage     put on Levaquin yesterday if no better in 3 days will start prednisone  . Headache(784.0)     rare  . Weakness     tingling and numbness both hands and left leg  . Joint pain   . Chronic back pain     cyst sitting on L4-5;slipped disc  . GERD (gastroesophageal reflux disease)     takes Omeprazole daily  . History of gastric ulcer at age 14  . Diverticulosis   . Depression     takes Cymbalta daily  . Insomnia     takes Restoril nightly  . Thyroid disease     Past Surgical History  Procedure Laterality Date  . Cataract extraction bilateral w/ anterior vitrectomy Bilateral   . Abdominal hysterectomy    . Cholecystectomy    . Knee arthroscopy Bilateral   . Tonsillectomy    . Colonoscopy  2014  . Upper gi endoscopy    . Breast surgery      lt lump-neg  . Dupuytren / palmar fasciotomy Bilateral 2007    x 3 to left and once to the right   . Tubal ligation    . Foot surgery Right   . Epidural injections      x 3  .  Lumbar fusion  10/2013    Dr. Lynann Bologna     reports that she has never smoked. She has never used smokeless tobacco. She reports that she drinks alcohol. She reports that she does not use illicit drugs.  Allergies  Allergen Reactions  . Requip [Ropinirole Hcl] Shortness Of Breath and Nausea And Vomiting  . Codeine Other (See Comments)    fatigue  . Minocycline Hives  . Morphine Sulfate Other (See Comments)    Palpitations  . Tetracyclines & Related Hives    Pt doesn't remember a reaction  . Zestril [Lisinopril] Cough  . Zetia [Ezetimibe] Other (See Comments)    Muscle and joint pain  . Amoxicillin Rash    Pt doesn't remember a reaction  . Sulfa Antibiotics Other (See Comments)    Headache (pt states that a blood pressure medicine caused a severe headache but doesn't remember a reaction to sulfa)    Family  History  Problem Relation Age of Onset  . Colon polyps Neg Hx   . Esophageal cancer Neg Hx   . Rectal cancer Neg Hx   . Stomach cancer Neg Hx   . Heart attack Mother   . Stroke Mother   . COPD Father   . Hypertension Father     Prior to Admission medications   Medication Sig Start Date End Date Taking? Authorizing Provider  anastrozole (ARIMIDEX) 1 MG tablet Take 1 tablet (1 mg total) by mouth daily. 07/13/15   Chauncey Cruel, MD  atorvastatin (LIPITOR) 80 MG tablet Take 1 tablet (80 mg total) by mouth daily. 02/27/14   Unk Pinto, MD  Calcium Carbonate-Vitamin D (CALTRATE 600+D) 600-400 MG-UNIT per chew tablet Chew 1 tablet by mouth daily. 01/13/14   Chauncey Cruel, MD  Cholecalciferol (VITAMIN D) 2000 UNITS tablet Take 4,000 Units by mouth daily.    Historical Provider, MD  cyclobenzaprine (FLEXERIL) 10 MG tablet Take 10 mg by mouth at bedtime. 09/06/14   Historical Provider, MD  doxazosin (CARDURA) 8 MG tablet Take 1/2 to 1 tablet daily as directed for BP 07/10/15   Unk Pinto, MD  DULoxetine (CYMBALTA) 30 MG capsule Take 1 capsule (30 mg total) by mouth 3 (three) times daily. 06/23/15   Vicie Mutters, PA-C  gabapentin (NEURONTIN) 300 MG capsule Take 1 capsule (300 mg total) by mouth at bedtime. 06/23/15   Vicie Mutters, PA-C  HYDROcodone-acetaminophen (NORCO/VICODIN) 5-325 MG tablet TAKE 1 TO 2 TABLETS BY MOUTH EVERY 6 TO 8 HOURS AS NEEDED FOR PAIN 02/18/15   Historical Provider, MD  levothyroxine (SYNTHROID, LEVOTHROID) 50 MCG tablet TAKE 1/2 OR 1 TABLET BY MOUTH EVERY MORNING 30-60 MINS BEFORE BREAKFAST ON AN EMPTY STOMACH 06/23/15   Vicie Mutters, PA-C  levothyroxine (SYNTHROID, LEVOTHROID) 50 MCG tablet TAKE 1/2 OR 1 TABLET BY MOUTH EVERY MORNING 30-60 MINS BEFORE BREAKFAST ON AN EMPTY STOMACH 07/29/15   Unk Pinto, MD  meloxicam (MOBIC) 15 MG tablet TAKE 1/2 TO 1 TABLET DAILY WITH FOOR FOR PAIN *DONT TAKE WITH ALEVE OR IBUPROFEN CAN TAKE TYLENOL* 06/23/15   Vicie Mutters, PA-C  mometasone (NASONEX) 50 MCG/ACT nasal spray USE 1 TO 2 SPRAYS IN EACH NOSTRIL TWICE DAILY 06/23/15   Vicie Mutters, PA-C  ranitidine (ZANTAC) 300 MG tablet TAKE 1 TABLET BY MOUTH TWICE A DAY WHILE TRYING TO GET OFF PPI THEN CAN GO TO ONCE NIGHTLY 06/23/15   Vicie Mutters, PA-C  sucralfate (CARAFATE) 1 g tablet TAKE 1 TABLET BY MOUTH 4 TIMES A DAY 04/26/15  Unk Pinto, MD  temazepam (RESTORIL) 30 MG capsule Take 1 capsule at hour of sleep as needed 06/23/15 12/21/15  Vicie Mutters, PA-C  traMADol (ULTRAM) 50 MG tablet TAKE 1 TABLET BY MOUTH 4 TIMES DAILY AS NEEDED 03/31/15   Vicie Mutters, PA-C  verapamil (CALAN-SR) 240 MG CR tablet Take 1 tablet (240 mg total) by mouth daily. with food 06/23/15   Vicie Mutters, PA-C    Physical Exam: Filed Vitals:   08/06/15 1130 08/06/15 1315  BP: 139/60 144/72  Pulse: 76 73  Temp: 98.9 F (37.2 C)   TempSrc: Oral   Resp: 18 25  SpO2: 97% 96%    Constitutional: NAD, calm, comfortable Filed Vitals:   08/06/15 1130 08/06/15 1315  BP: 139/60 144/72  Pulse: 76 73  Temp: 98.9 F (37.2 C)   TempSrc: Oral   Resp: 18 25  SpO2: 97% 96%   Eyes: PER, lids and conjunctivae normal ENMT: Mucous membranes are moist. Posterior pharynx clear of any exudate or lesions.Normal dentition.  Neck: normal, supple, no masses Respiratory: clear to auscultation bilaterally, no wheezing, no crackles. Normal respiratory effort. No accessory muscle use.  Cardiovascular: Regular rate and rhythm, no murmurs / rubs / gallops. No extremity edema. 2+ pedal pulses. No carotid bruits.  Abdomen: no tenderness, no masses palpated. No hepatomegaly. Bowel sounds positive.  Musculoskeletal: no clubbing / cyanosis. No joint deformity upper and lower extremities. Good ROM, no contractures. Normal muscle tone.  Skin: Quarter size scaly patch lateral left breast Neurologic: CN 2-12 grossly intact. Sensation intact, DTR normal. Strength 5/5 in all 4.  Psychiatric:  Normal judgment and insight. Alert and oriented x 3. Normal mood.   Labs on Admission: I have personally reviewed following labs and imaging studies  CBC:  Recent Labs Lab 08/06/15 1131  WBC 8.3  HGB 8.2*  HCT 25.6*  MCV 82.6  PLT [id number]   Basic Metabolic Panel:  Recent Labs Lab 08/06/15 1131  NA 138  K 3.6  CL 104  CO2 23  GLUCOSE 88  BUN 13  CREATININE 0.79  CALCIUM 9.1  Urine analysis:    Component Value Date/Time   COLORURINE YELLOW 06/16/2014 1523   APPEARANCEUR CLEAR 06/16/2014 1523   LABSPEC 1.013 06/16/2014 1523   PHURINE 5.0 06/16/2014 1523   GLUCOSEU NEG 06/16/2014 1523   HGBUR NEG 06/16/2014 1523   BILIRUBINUR NEG 06/16/2014 1523   KETONESUR NEG 06/16/2014 1523   PROTEINUR NEG 06/16/2014 1523   UROBILINOGEN 0.2 06/16/2014 1523   NITRITE NEG 06/16/2014 1523   LEUKOCYTESUR NEG 06/16/2014 1523    Radiological Exams on Admission: Dg Chest 2 View  08/06/2015  CLINICAL DATA:  Left breast and left arm pain. Left jaw pain with dizziness and nausea for 1 week. EXAM: CHEST  2 VIEW COMPARISON:  10/19/2013.  CT chest 10/18/2013. FINDINGS: Trachea is midline. Heart size stable. There may be mild interstitial prominence and indistinctness. No pleural fluid. IMPRESSION: Question mild pulmonary edema. Electronically Signed   By: Lorin Picket M.D.   On: 08/06/2015 12:33    EKG: Independently reviewed.  Atrial fibrillation with a competing junctional pacemaker Left axis deviation Left bundle branch block Abnormal ECG Morphologic changes in V1-V3 Confirmed by NGUYEN, EMILY ([id number]) on 08/06/2015 1:13:26 PM  Assessment/Plan  Chest Pain with associated dyspnea. Heart score 5.  Need to exclude pulmonary embolism, especially since patient takes Arimidex. Other considerations: ACS, GERD, MSK pain.  She has chronic anemia with a 1 gram drop in hgb, not likely cause  of acute symptoms -Place in observation-telemetry -D-dimer, if abnormal then CT angiography of the chest to  rule out PE  -Cycle troponins -echocardiogram - possible mild pulmonary edema seen on CXR - A1c & lipid panel - full asa now x1 - NTG prn chest pain - GI cocktail prn - Repeat EKG in am   Atrial fibrillation, rate controlled. Chadsvasc 3. Hx of paroxysmal SVT documented in PMH, on Verapamil at home. -Monitor on telemetry -Continue Calan  Chronic anemia. Baseline hgb 9-10, down to 8.2 today. No overt GI bleeding. Hemoccult negative today. Patient up to date on colonoscopy -am CBC  Hyperlipidemia.  -continue home statin  GERD. Stable.  -continue daily H2 blocker, Carafate  Hx of PUD 2009. Patient taking Meloxicam increasing risk for recurrent PUD.  -Continue Zantac. She has weaned off PPIs  Hypothyroidism. Normal TSH mid March Continue home Synthroid  Hx of lumbar radiculopathy, reason for Cymbalta and Neurontin?? -continue Cymbalta and Neurontin  Hx of left breast cancer 2015. Pathology c/w Invasive lobular carcinoma, grade 1 of 3. Final resection margins negative for carcinoma. Followed by Dr. Jana Hakim -continue Arimidex  DVT prophylaxis:  Lovenox Code Status: Full code Family Communication: None Disposition Plan: Home in 24-48 hours Consults called: None Admission status:  Observation - Telemetry bed   Tye Savoy NP [facility] Pager [phone]  If 7PM-7AM, please contact night-coverage [email or web address] Password [facility]  08/06/2015, 2:34 PM

## 2015-08-06 NOTE — ED Notes (Signed)
Admitting at bedside 

## 2015-08-07 ENCOUNTER — Other Ambulatory Visit (HOSPITAL_COMMUNITY): Payer: Medicare Other

## 2015-08-07 ENCOUNTER — Observation Stay (HOSPITAL_BASED_OUTPATIENT_CLINIC_OR_DEPARTMENT_OTHER): Payer: Medicare Other

## 2015-08-07 DIAGNOSIS — I5021 Acute systolic (congestive) heart failure: Secondary | ICD-10-CM | POA: Diagnosis not present

## 2015-08-07 DIAGNOSIS — R0600 Dyspnea, unspecified: Secondary | ICD-10-CM | POA: Diagnosis not present

## 2015-08-07 DIAGNOSIS — I10 Essential (primary) hypertension: Secondary | ICD-10-CM

## 2015-08-07 DIAGNOSIS — D649 Anemia, unspecified: Secondary | ICD-10-CM | POA: Diagnosis not present

## 2015-08-07 DIAGNOSIS — Z8679 Personal history of other diseases of the circulatory system: Secondary | ICD-10-CM

## 2015-08-07 DIAGNOSIS — E782 Mixed hyperlipidemia: Secondary | ICD-10-CM

## 2015-08-07 DIAGNOSIS — R079 Chest pain, unspecified: Secondary | ICD-10-CM

## 2015-08-07 DIAGNOSIS — K219 Gastro-esophageal reflux disease without esophagitis: Secondary | ICD-10-CM | POA: Diagnosis not present

## 2015-08-07 DIAGNOSIS — I471 Supraventricular tachycardia: Secondary | ICD-10-CM

## 2015-08-07 DIAGNOSIS — E669 Obesity, unspecified: Secondary | ICD-10-CM

## 2015-08-07 DIAGNOSIS — E038 Other specified hypothyroidism: Secondary | ICD-10-CM | POA: Diagnosis not present

## 2015-08-07 HISTORY — PX: TRANSTHORACIC ECHOCARDIOGRAM: SHX275

## 2015-08-07 LAB — CBC
HCT: 23.7 % — ABNORMAL LOW (ref 36.0–46.0)
HEMOGLOBIN: 7.4 g/dL — AB (ref 12.0–15.0)
MCH: 26 pg (ref 26.0–34.0)
MCHC: 31.2 g/dL (ref 30.0–36.0)
MCV: 83.2 fL (ref 78.0–100.0)
Platelets: 307 10*3/uL (ref 150–400)
RBC: 2.85 MIL/uL — AB (ref 3.87–5.11)
RDW: 15.2 % (ref 11.5–15.5)
WBC: 6.8 10*3/uL (ref 4.0–10.5)

## 2015-08-07 LAB — BASIC METABOLIC PANEL
ANION GAP: 8 (ref 5–15)
BUN: 11 mg/dL (ref 6–20)
CALCIUM: 8.7 mg/dL — AB (ref 8.9–10.3)
CO2: 25 mmol/L (ref 22–32)
Chloride: 107 mmol/L (ref 101–111)
Creatinine, Ser: 0.57 mg/dL (ref 0.44–1.00)
GFR calc non Af Amer: >60 mL/min (ref >60)
Glucose, Bld: 101 mg/dL — ABNORMAL HIGH (ref 65–99)
Potassium: 3.8 mmol/L (ref 3.5–5.1)
SODIUM: 140 mmol/L (ref 135–145)

## 2015-08-07 LAB — LIPID PANEL
Cholesterol: 122 mg/dL (ref 0–200)
HDL: 39 mg/dL — AB (ref >40)
LDL CALC: 73 mg/dL (ref 0–99)
TRIGLYCERIDES: 52 mg/dL (ref <150)
Total CHOL/HDL Ratio: 3.1 RATIO
VLDL: 10 mg/dL (ref 0–40)

## 2015-08-07 LAB — ECHOCARDIOGRAM COMPLETE
HEIGHTINCHES: 65 in
Weight: 3593.6 oz

## 2015-08-07 LAB — HEMOGLOBIN A1C
Hgb A1c MFr Bld: 6.5 % — ABNORMAL HIGH (ref 4.8–5.6)
Mean Plasma Glucose: 140 mg/dL

## 2015-08-07 LAB — HEMOGLOBIN AND HEMATOCRIT, BLOOD
HEMATOCRIT: 26.7 % — AB (ref 36.0–46.0)
HEMOGLOBIN: 8.6 g/dL — AB (ref 12.0–15.0)

## 2015-08-07 LAB — STREP PNEUMONIAE URINARY ANTIGEN: Strep Pneumo Urinary Antigen: NEGATIVE

## 2015-08-07 LAB — PREPARE RBC (CROSSMATCH)

## 2015-08-07 IMAGING — CR DG LUMBAR SPINE 2-3V
2 series · 2 of 2 positions shown · non-contrast
Comparison: MRI lumbar spine 08/06/2013.

CLINICAL DATA: Back pain.

EXAM:
LUMBAR SPINE - 2-3 VIEW; DG C-ARM 61-120 MIN

[lateral (1 of 2)]
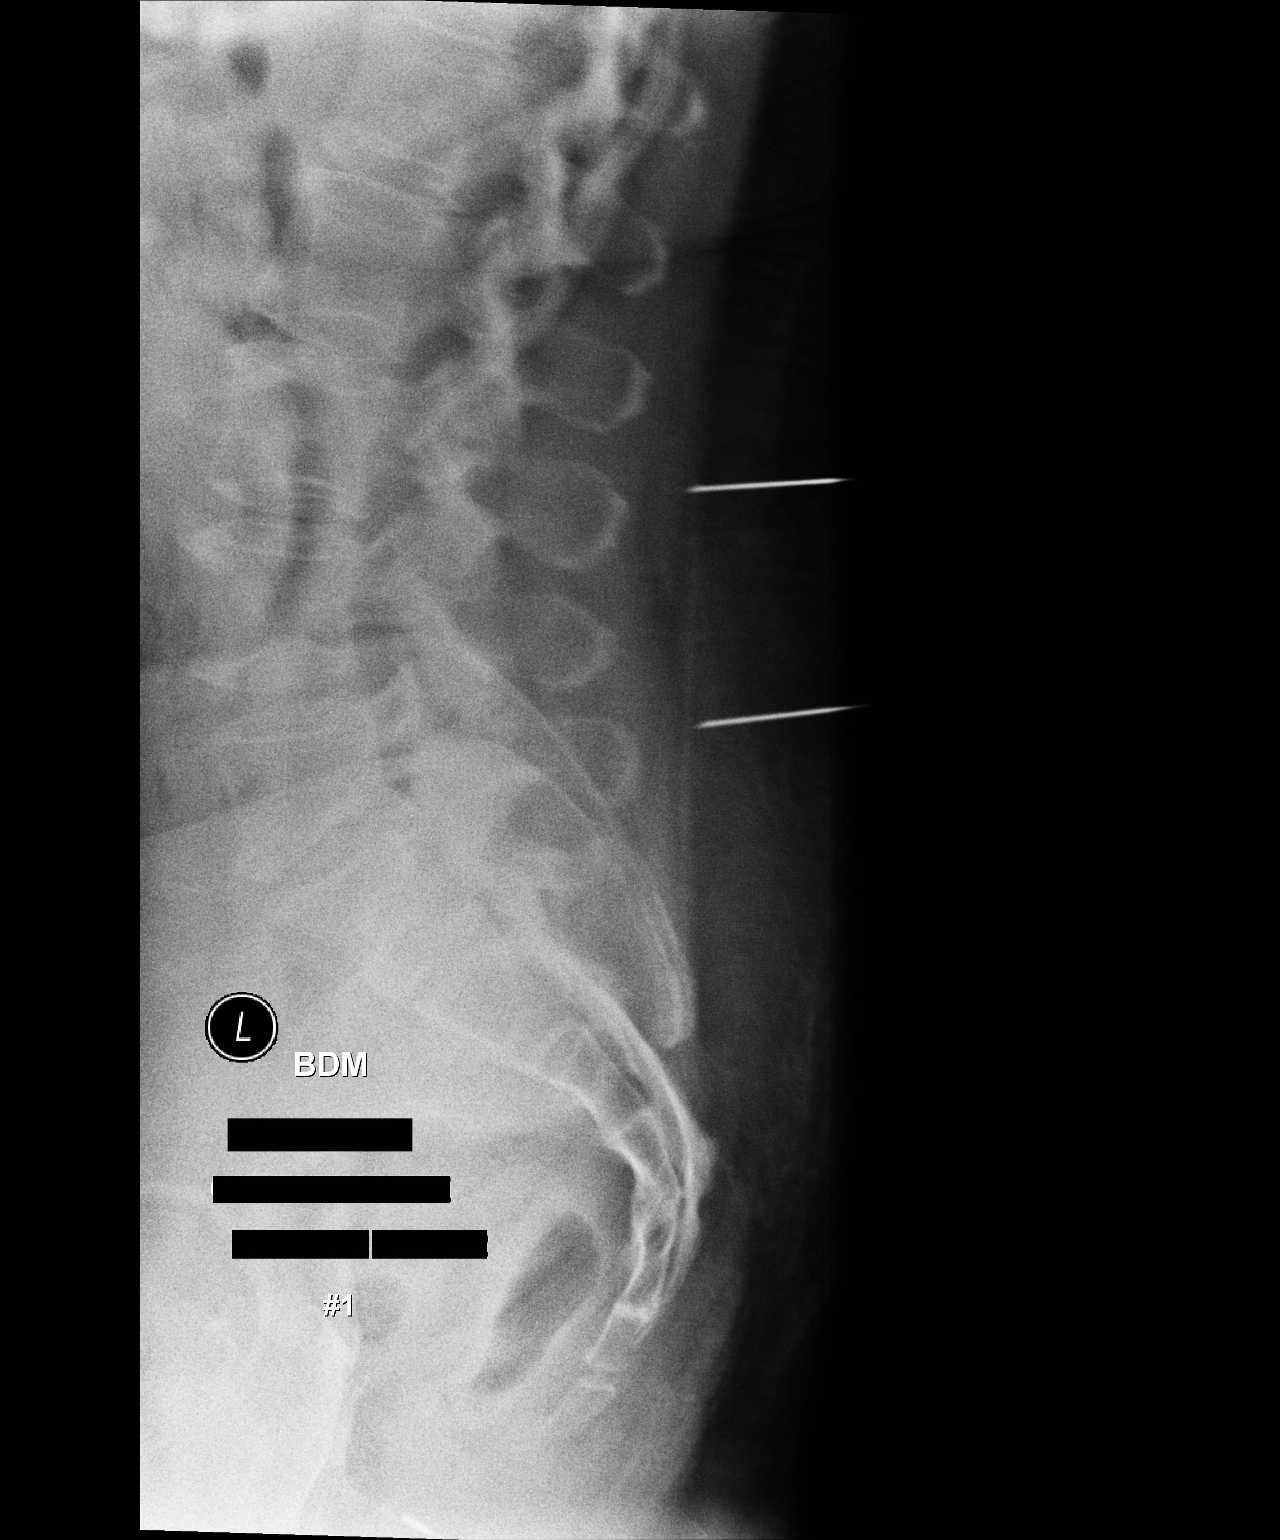

[lateral (2 of 2)]
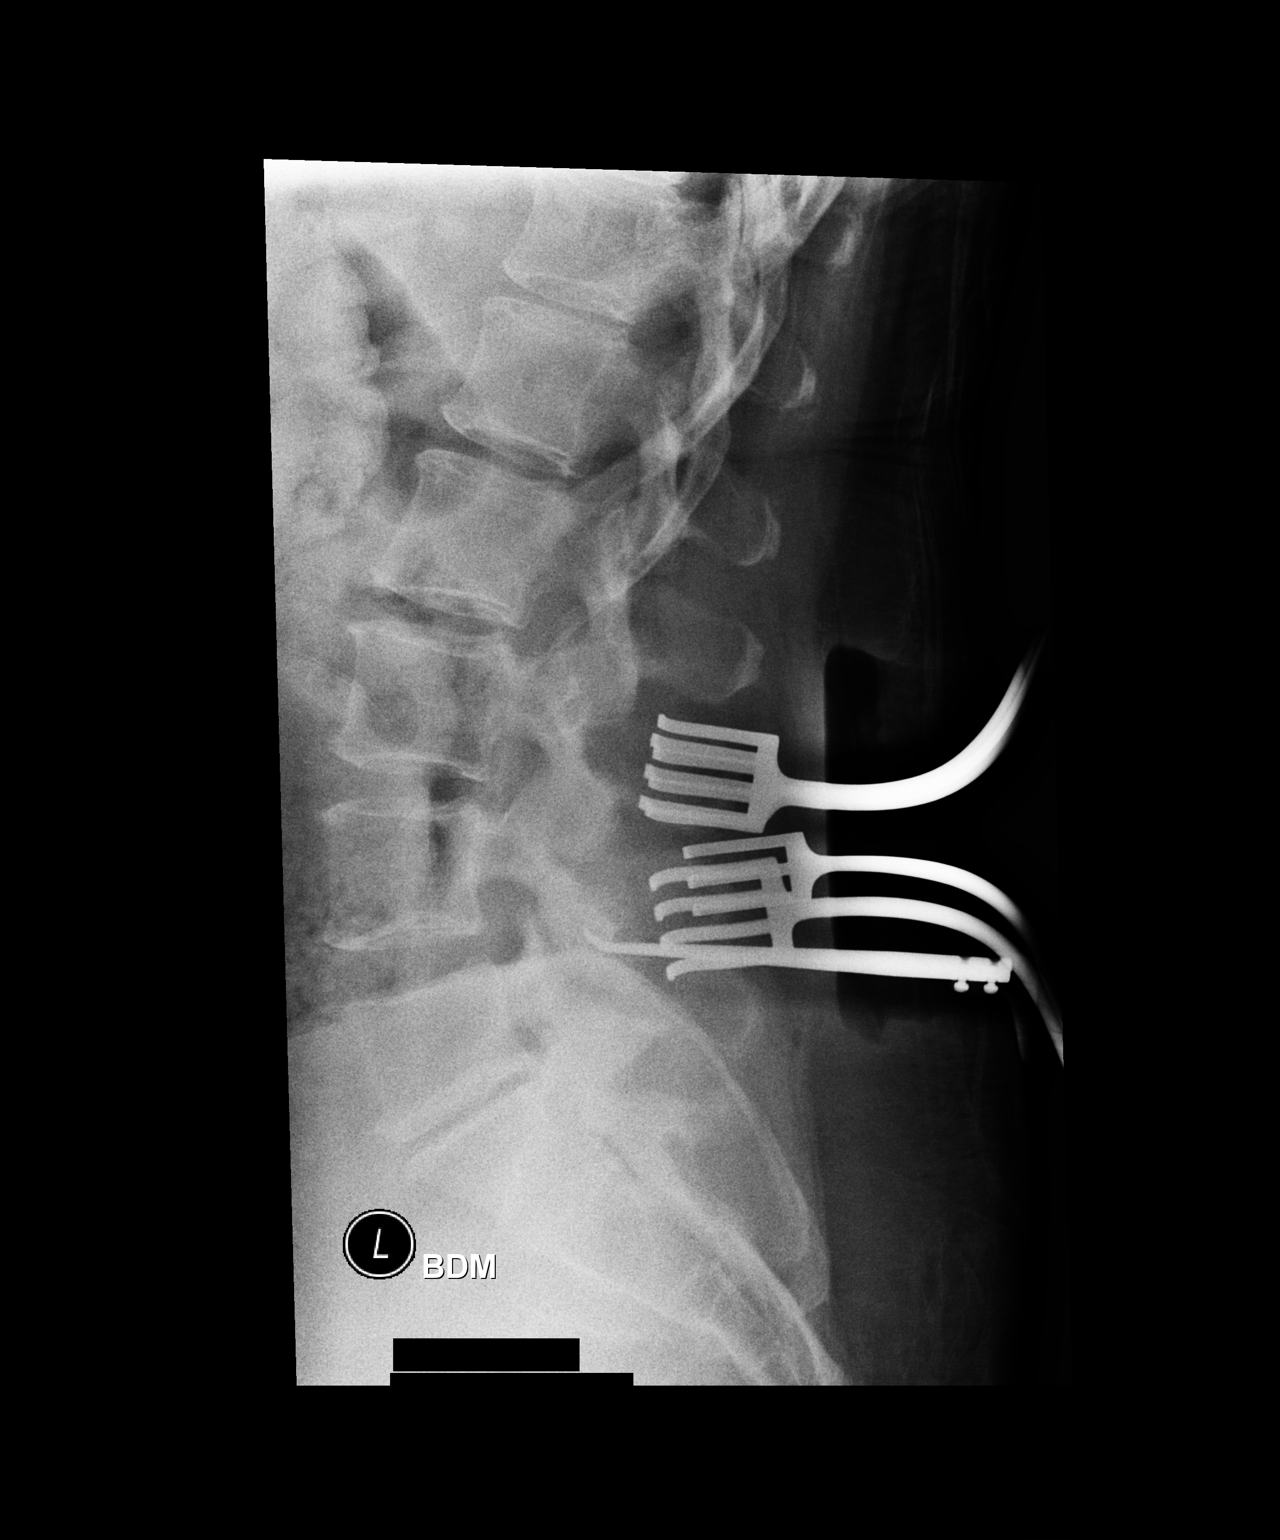

[2 of 2 positions shown; findings below may reference images not displayed]

FINDINGS: Cross-table film 1 demonstrates needles directed most closely at the
L3 and L5 spinous processes.

Cross-table film 2 demonstrates an angled probe directed most
closely toward the L4-5 interspace.

Spot radiographs from intraoperative C-arm demonstrate pedicle screw
and rod construct across the L4-5 interspace.
IMPRESSION: L4-5 fusion.

## 2015-08-07 MED ORDER — CARVEDILOL 6.25 MG PO TABS
6.2500 mg | ORAL_TABLET | Freq: Two times a day (BID) | ORAL | Status: DC
  Administered 2015-08-08 – 2015-08-11 (×7): 6.25 mg via ORAL
  Filled 2015-08-07 (×7): qty 1

## 2015-08-07 MED ORDER — SODIUM CHLORIDE 0.9 % IV SOLN
Freq: Once | INTRAVENOUS | Status: AC
  Administered 2015-08-07: 16:00:00 via INTRAVENOUS

## 2015-08-07 MED ORDER — LEVOFLOXACIN IN D5W 750 MG/150ML IV SOLN
750.0000 mg | INTRAVENOUS | Status: DC
  Administered 2015-08-07 – 2015-08-10 (×4): 750 mg via INTRAVENOUS
  Filled 2015-08-07 (×5): qty 150

## 2015-08-07 MED ORDER — METOPROLOL TARTRATE 12.5 MG HALF TABLET
12.5000 mg | ORAL_TABLET | Freq: Two times a day (BID) | ORAL | Status: DC
  Administered 2015-08-07: 12.5 mg via ORAL
  Filled 2015-08-07: qty 1

## 2015-08-07 MED ORDER — FUROSEMIDE 20 MG PO TABS
20.0000 mg | ORAL_TABLET | Freq: Two times a day (BID) | ORAL | Status: DC
  Administered 2015-08-08 – 2015-08-11 (×6): 20 mg via ORAL
  Filled 2015-08-07 (×6): qty 1

## 2015-08-07 NOTE — Progress Notes (Signed)
Paper work as been given for the Forensic scientist. When it is completed please page the chaplain.

## 2015-08-07 NOTE — Consult Note (Signed)
Cardiologist:  Domenic Polite Reason for Consult: Chest Pain Referring Physician: Elsie Baynes is an 70 y.o. female.  HPI:   The patient is a 70yo female with a history of LBBB as far back as 2007.  It does not appear we have seen her in the clinic since 2008.  She had ischemic evaluation at that time which was negative with normal LVEF.  Her history also includes HLD, HTN, breast cancer(2015).    She presents with chest pain, which radiates around the left side into her left arm and and jaw, along with severe fatigue/weakness.  She has a lot stairs at home and a week ago she started having the pain, worse going up the stairs, and she was exhausted after going up.  She thought the pani was indigestion.  Her mother died of a massive MI at age 18.   She has also noticed her heart beating fast.    Past Medical History  Diagnosis Date  . Arthritis   . Anxiety   . Hyperlipidemia     takes Atorvastatin 3 times a week  . Shingles   . Anemia   . Allergy     Nasonex daily as needed  . Wears glasses   . PONV (postoperative nausea and vomiting)   . Breast cancer ([place])      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  . S/P radiation therapy 07/18/2013-08/28/2013    1) Left Breast / 50 Gy in 25 fractions/ 2) Left Breast Boost / 10 Gy in 5 fractions  . Hypertension     takes Cardura nightly and Verapamil daily  . History of shingles   . Sinus drainage     put on Levaquin yesterday if no better in 3 days will start prednisone  . Headache(784.0)     rare  . Weakness     tingling and numbness both hands and left leg  . Joint pain   . Chronic back pain     cyst sitting on L4-5;slipped disc  . GERD (gastroesophageal reflux disease)     takes Omeprazole daily  . History of gastric ulcer at age 45  . Diverticulosis   . Depression     takes Cymbalta daily  . Insomnia     takes Restoril nightly  . Thyroid disease     Past Surgical History  Procedure Laterality  Date  . Cataract extraction bilateral w/ anterior vitrectomy Bilateral   . Abdominal hysterectomy    . Cholecystectomy    . Knee arthroscopy Bilateral   . Tonsillectomy    . Colonoscopy  2014  . Upper gi endoscopy    . Breast surgery      lt lump-neg  . Dupuytren / palmar fasciotomy Bilateral 2007    x 3 to left and once to the right   . Tubal ligation    . Foot surgery Right   . Epidural injections      x 3  . Lumbar fusion  10/2013    Dr. Lynann Bologna    Family History  Problem Relation Age of Onset  . Colon polyps Neg Hx   . Esophageal cancer Neg Hx   . Rectal cancer Neg Hx   . Stomach cancer Neg Hx   . Heart attack Mother   . Stroke Mother   . COPD Father   . Hypertension Father     Social History:  reports that she has never smoked. She has never used smokeless  tobacco. She reports that she drinks alcohol. She reports that she does not use illicit drugs.  Allergies:  Allergies  Allergen Reactions  . Requip [Ropinirole Hcl] Shortness Of Breath and Nausea And Vomiting  . Codeine Other (See Comments)    fatigue  . Minocycline Hives  . Morphine Sulfate Other (See Comments)    Palpitations  . Tetracyclines & Related Hives    Pt doesn't remember a reaction  . Zestril [Lisinopril] Cough  . Zetia [Ezetimibe] Other (See Comments)    Muscle and joint pain  . Amoxicillin Rash    Pt doesn't remember a reaction  . Sulfa Antibiotics Other (See Comments)    Headache (pt states that a blood pressure medicine caused a severe headache but doesn't remember a reaction to sulfa)    Medications: Scheduled Meds: . anastrozole  1 mg Oral Daily  . atorvastatin  40 mg Oral q1800  . cyclobenzaprine  10 mg Oral QHS  . DULoxetine  30 mg Oral TID  . enoxaparin (LOVENOX) injection  40 mg Subcutaneous Q24H  . famotidine  20 mg Oral QHS  . fluticasone  1 spray Each Nare Daily  . gabapentin  300 mg Oral QHS  . levothyroxine  50 mcg Oral QAC breakfast  . meloxicam  7.5-15 mg Oral Daily   . sucralfate  1 g Oral QID  . verapamil  240 mg Oral Daily   Continuous Infusions:  PRN Meds:.acetaminophen, HYDROcodone-acetaminophen, nitroGLYCERIN, ondansetron (ZOFRAN) IV, temazepam, traMADol   Results for orders placed or performed during the hospital encounter of 08/06/15 (from the past 48 hour(s))  Basic metabolic panel     Status: None   Collection Time: 08/06/15 11:31 AM  Result Value Ref Range   Sodium 138 135 - 145 mmol/L   Potassium 3.6 3.5 - 5.1 mmol/L   Chloride 104 101 - 111 mmol/L   CO2 23 22 - 32 mmol/L   Glucose, Bld 88 65 - 99 mg/dL   BUN 13 6 - 20 mg/dL   Creatinine, Ser 0.79 0.44 - 1.00 mg/dL   Calcium 9.1 8.9 - 10.3 mg/dL   GFR calc non Af Amer >60 >60 mL/min   GFR calc Af Amer >60 >60 mL/min    Comment: (NOTE) The eGFR has been calculated using the CKD EPI equation. This calculation has not been validated in all clinical situations. eGFR's persistently <60 mL/min signify possible Chronic Kidney Disease.    Anion gap 11 5 - 15  CBC     Status: Abnormal   Collection Time: 08/06/15 11:31 AM  Result Value Ref Range   WBC 8.3 4.0 - 10.5 K/uL   RBC 3.10 (L) 3.87 - 5.11 MIL/uL   Hemoglobin 8.2 (L) 12.0 - 15.0 g/dL   HCT 25.6 (L) 36.0 - 46.0 %   MCV 82.6 78.0 - 100.0 fL   MCH 26.5 26.0 - 34.0 pg   MCHC 32.0 30.0 - 36.0 g/dL   RDW 15.1 11.5 - 15.5 %   Platelets 332 150 - 400 K/uL  D-dimer, quantitative (not at [facility])     Status: Abnormal   Collection Time: 08/06/15 11:31 AM  Result Value Ref Range   D-Dimer, Quant 4.42 (H) 0.00 - 0.50 ug/mL-FEU    Comment: (NOTE) At the manufacturer cut-off of 0.50 ug/mL FEU, this assay has been documented to exclude PE with a sensitivity and negative predictive value of 97 to 99%.  At this time, this assay has not been approved by the FDA to exclude DVT/VTE.  Results should be correlated with clinical presentation.   I-stat troponin, ED     Status: None   Collection Time: 08/06/15 11:39 AM  Result Value Ref Range    Troponin i, poc 0.00 0.00 - 0.08 ng/mL   Comment 3            Comment: Due to the release kinetics of cTnI, a negative result within the first hours of the onset of symptoms does not rule out myocardial infarction with certainty. If myocardial infarction is still suspected, repeat the test at appropriate intervals.   POC occult blood, ED RN will collect     Status: None   Collection Time: 08/06/15  2:37 PM  Result Value Ref Range   Fecal Occult Bld NEGATIVE NEGATIVE  Hemoglobin A1c     Status: Abnormal   Collection Time: 08/06/15  6:17 PM  Result Value Ref Range   Hgb A1c MFr Bld 6.5 (H) 4.8 - 5.6 %    Comment: (NOTE)         Pre-diabetes: 5.7 - 6.4         Diabetes: >6.4         Glycemic control for adults with diabetes: <7.0    Mean Plasma Glucose 140 mg/dL    Comment: (NOTE) Performed At: [facility] [place], [place] [id number] Lindon Romp MD ZM:[id number]   Troponin I-serum (0, 3, 6 hours)     Status: None   Collection Time: 08/06/15  6:17 PM  Result Value Ref Range   Troponin I <0.03 <0.031 ng/mL    Comment:        NO INDICATION OF MYOCARDIAL INJURY.   Iron and TIBC     Status: Abnormal   Collection Time: 08/06/15  6:17 PM  Result Value Ref Range   Iron 12 (L) 28 - 170 ug/dL   TIBC 342 250 - 450 ug/dL   Saturation Ratios 4 (L) 10.4 - 31.8 %   UIBC 330 ug/dL  Ferritin     Status: None   Collection Time: 08/06/15  6:17 PM  Result Value Ref Range   Ferritin 174 11 - 307 ng/mL  Troponin I-serum (0, 3, 6 hours)     Status: None   Collection Time: 08/06/15 10:11 PM  Result Value Ref Range   Troponin I <0.03 <0.031 ng/mL    Comment:        NO INDICATION OF MYOCARDIAL INJURY.   Lipid panel     Status: Abnormal   Collection Time: 08/07/15  4:50 AM  Result Value Ref Range   Cholesterol 122 0 - 200 mg/dL   Triglycerides 52 <150 mg/dL   HDL 39 (L) >40 mg/dL   Total CHOL/HDL Ratio 3.1 RATIO   VLDL 10 0 - 40 mg/dL   LDL Cholesterol 73  0 - 99 mg/dL    Comment:        Total Cholesterol/HDL:CHD Risk Coronary Heart Disease Risk Table                     Men   Women  1/2 Average Risk   3.4   3.3  Average Risk       5.0   4.4  2 X Average Risk   9.6   7.1  3 X Average Risk  23.4   11.0        Use the calculated Patient Ratio above and the CHD Risk Table to determine the patient's CHD Risk.  ATP III CLASSIFICATION (LDL):  <100     mg/dL   Optimal  100-129  mg/dL   Near or Above                    Optimal  130-159  mg/dL   Borderline  160-189  mg/dL   High  >190     mg/dL   Very High   Basic metabolic panel     Status: Abnormal   Collection Time: 08/07/15  4:50 AM  Result Value Ref Range   Sodium 140 135 - 145 mmol/L   Potassium 3.8 3.5 - 5.1 mmol/L   Chloride 107 101 - 111 mmol/L   CO2 25 22 - 32 mmol/L   Glucose, Bld 101 (H) 65 - 99 mg/dL   BUN 11 6 - 20 mg/dL   Creatinine, Ser 0.57 0.44 - 1.00 mg/dL   Calcium 8.7 (L) 8.9 - 10.3 mg/dL   GFR calc non Af Amer >60 >60 mL/min   GFR calc Af Amer >60 >60 mL/min    Comment: (NOTE) The eGFR has been calculated using the CKD EPI equation. This calculation has not been validated in all clinical situations. eGFR's persistently <60 mL/min signify possible Chronic Kidney Disease.    Anion gap 8 5 - 15  CBC     Status: Abnormal   Collection Time: 08/07/15  4:50 AM  Result Value Ref Range   WBC 6.8 4.0 - 10.5 K/uL   RBC 2.85 (L) 3.87 - 5.11 MIL/uL   Hemoglobin 7.4 (L) 12.0 - 15.0 g/dL   HCT 23.7 (L) 36.0 - 46.0 %   MCV 83.2 78.0 - 100.0 fL   MCH 26.0 26.0 - 34.0 pg   MCHC 31.2 30.0 - 36.0 g/dL   RDW 15.2 11.5 - 15.5 %   Platelets 307 150 - 400 K/uL    Dg Chest 2 View  08/06/2015  CLINICAL DATA:  Left breast and left arm pain. Left jaw pain with dizziness and nausea for 1 week. EXAM: CHEST  2 VIEW COMPARISON:  10/19/2013.  CT chest 10/18/2013. FINDINGS: Trachea is midline. Heart size stable. There may be mild interstitial prominence and indistinctness. No  pleural fluid. IMPRESSION: Question mild pulmonary edema. Electronically Signed   By: Lorin Picket M.D.   On: 08/06/2015 12:33   Ct Angio Chest Pe W/cm &/or Wo Cm  08/06/2015  CLINICAL DATA:  Acute onset of left-sided chest pain and elevated D-dimer. Dyspnea. Initial encounter. EXAM: CT ANGIOGRAPHY CHEST WITH CONTRAST TECHNIQUE: Multidetector CT imaging of the chest was performed using the standard protocol during bolus administration of intravenous contrast. Multiplanar CT image reconstructions and MIPs were obtained to evaluate the vascular anatomy. CONTRAST:  90 mL of Isovue 370 IV contrast COMPARISON:  Chest radiograph performed earlier today at 11:50 a.m., and CTA of the chest performed 10/18/2013 FINDINGS: There is no evidence of pulmonary embolus. Mild peripheral opacity is noted at the left upper lobe, raising concern for mild pneumonia. The appearance is less typical for pulmonary infarct. No pleural effusion or pneumothorax is seen. No masses are identified; no abnormal focal contrast enhancement is seen. A small pericardial effusion is noted. Would correlate for any evidence of pericarditis. The heart is mildly enlarged. A mildly enlarged 1.2 cm periaortic node is noted. No additional mediastinal lymphadenopathy is seen. The great vessels are grossly unremarkable in appearance. No axillary lymphadenopathy is seen. The visualized portions of the thyroid gland are unremarkable in appearance. An apparent postoperative seroma is  noted at the medial aspect of the left breast, measuring approximately 4.7 x 2.5 cm, with adjacent postoperative change. Hypodensities within the liver are only minimally changed in size from 2015 and likely reflect cysts, measuring up to 2.6 cm in size. The visualized portions of the spleen are unremarkable. A small hiatal hernia is noted. The visualized portions of the pancreas and adrenal glands are within normal limits. The patient is status post cholecystectomy, with clips  noted at the gallbladder fossa. No acute osseous abnormalities are seen. Review of the MIP images confirms the above findings. IMPRESSION: 1. No evidence of pulmonary embolus. 2. Mild peripheral opacity at the left upper lung lobe, concerning for mild pneumonia. 3. Small pericardial effusion noted. Would correlate for any clinical evidence of pericarditis. 4. Mild cardiomegaly. 5. Mildly enlarged 1.2 cm periaortic node noted. 6. Apparent postoperative seroma at the medial aspect of the left breast, measuring 4.7 x 2.5 cm, with adjacent postoperative change. 7. Scattered hepatic cysts are mildly increased in size. 8. Small hiatal hernia seen. Electronically Signed   By: Garald Balding M.D.   On: 08/06/2015 19:18    Review of Systems  Constitutional: Positive for malaise/fatigue. Negative for fever and chills.  HENT: Negative for sore throat.   Respiratory: Negative for cough and shortness of breath.   Cardiovascular: Positive for chest pain and palpitations. Negative for orthopnea, leg swelling and PND.  Gastrointestinal: Positive for nausea. Negative for vomiting, abdominal pain, blood in stool and melena.  Genitourinary: Negative for dysuria and hematuria.  Musculoskeletal: Positive for myalgias.  Neurological: Positive for weakness. Negative for dizziness.  All other systems reviewed and are negative.  Blood pressure 133/67, pulse 84, temperature 98.4 F (36.9 C), temperature source Oral, resp. rate 18, height _0  (1.651 m), weight 224 lb 9.6 oz (101.878 kg), SpO2 94 %. Physical Exam  Nursing note and vitals reviewed. Constitutional: She appears well-developed and well-nourished. No distress.  HENT:  Head: Normocephalic and atraumatic.  Mouth/Throat: No oropharyngeal exudate.  Eyes: EOM are normal. Pupils are equal, round, and reactive to light.  Neck: Normal range of motion. Neck supple.  Cardiovascular: Normal rate, regular rhythm, S1 normal and S2 normal.   Murmur heard.  Systolic  murmur is present with a grade of 1/6   Diastolic murmur is present with a grade of 1/6  Pulses:      Radial pulses are 2+ on the right side, and 2+ on the left side.       Dorsalis pedis pulses are 2+ on the right side, and 2+ on the left side.  Lymphadenopathy:    She has no cervical adenopathy.    Assessment/Plan:   Atrial fibrillation (HCC) Currently maintaining sinus rhythm. CHADSVASC = 4, if she does indeed have CAD.  No anticoag until stool verified.  No CP at the moment.   TSH WNL one month ago This patients CHA2DS2-VASc Score and unadjusted Ischemic Stroke Rate (% per year) is equal to 4.8 % stroke rate/year from a score of 4  Above score calculated as 1 point each if present [CHF, HTN, DM, Vascular=MI/PAD/Aortic Plaque, Age if 65-74, or Female] Above score calculated as 2 points each if present [Age > 75, or Stroke/TIA/TE]    Chest pain with High Risk for Cardiac Etiology Troponin is negative 2, however, the patient's symptoms are extremely concerning for angina. She also has new onset paroxysmal atrial fibrillation.  Currently in sinus rhythm with a known left bundle branch block. This is in the  setting of anemia and as of this morning her hemoglobin has dropped to 7.4.  I  think this is more than just demand ischemia given the severity of her symptoms. . I think she needs a left heart catheterization, especially since her mother died of a massive MI at age 2 and she has risk facotrs for it.  However, we need to make sure she is not bleeding. She does have well-controlled lipids on 82m lipitor.    Add low dose BB (changed to Carvedilol & d/c Verapamil)  2-D echocardiogram is pending.    Chronic anemia Hgb on 03/31/15: 10.1, 07/13/15: 9.2, yesterday 8.2 and today 7.4. Rectal was heme negative.  I have ordered occult blood stool to be sure.   Recheck hgb this afternoon.  Consider giving PRBCs: definitely if she goes below 7.0.   Iron is low- 12. Colonoscopy in 2014 with  diverticulosis.     Pericardial effusion, small   Identified on CT.  Echo pending   Dyspnea - multifactorial. Potentially related to anemia as well as heart failure symptoms with possible ischemia.   Hyperlipidemia  On statin   GERD   Paroxysmal supraventricular tachycardia (HCC)   Short run on tele x1.     Hypothyroidism     History of ulcer disease    Absolute anemia  HAGER, BRYAN, PAC 08/07/2015, 7:57 AM   I have seen, examined and evaluated the patient this afternoon along with Mr. HSamara Snide [place]  After reviewing all the available data and chart,  I agree with his findings, examination as well as impression recommendations.  I then return to see the patient after the echocardiogram results to discuss the results and further plans.  Briefly: Patient is a very pleasant 70year old woman with no prior cardiac history besides the known left bundle branch block since 10 years ago who had an ischemic evaluation at that time which was normal. He does have cardiac risk factors of hypertension, hyperlipidemia and mild obesity along with chronic anemia who had breast cancer treated with radiation therapy about 2 years ago. She is basically noted several weeks of worsening exertional dyspnea along with PND/orthopnea. However she now presented with an episode of left-sided chest pain that is very concerning for anginal equivalent. She is also noted episodes of her heart beating quickly and irregularly. This takes her breath away and also has made her feel more short of breath, as if she is exerting herself. On initial evaluation she is found to be more anemic than baseline with acute on chronic anemia., She was also noted to have a short episode of atrial fibrillation while in the ER. However currently she is back in normal sinus rhythm. Her troponin levels have been negative, but most of her episodes were exertional suggesting more of an unstable angina process.   Her exam is only notable mostly  for a split S2 but no other significant edema or rales. Maybe fine crackles at the bases   Echo: Technically difficult study. EF 25-30% with severe diffuse HK. Septal motion abnormality with dyssynergy of paroxysmal consistent with LBBB. GRII DD with high filling pressures.  PA pressure ~34 mmHg.  Small-moderate free-flowing pericardial effusion (circumferential)  On initial evaluation my thought was that she would probably be best evaluated with a cardiac catheterization to evaluate for any existing coronary disease. Certainly she could have some demand ischemia symptoms with her worsening anemia, however not everyone with her hemoglobin of 7.4 has such profound dyspnea and exertional chest pressure.  The recommendation at that time was to plan left heart catheterization on Monday. We recommended transfusion.  I then reviewed the echocardiogram with the reading physician. There is certainly a left bundle branch block mediated septal bounce and dyssynergy that makes accurately assessing the EF difficult, however the report suggests significant we reduced ejection fraction of roughly 30%. There is a small to moderate pericardial effusion with no tamponade physiology.  Now in light of this new finding with the abnormal EF on echo, I would recommend stopping the verapamil. I have started carvedilol instead of metoprolol. Have also started low-dose Lasix starting tomorrow as she has had some heart failure symptoms of PND and orthopnea.  At this point I discussed with the patient and her son that think the best course of action for evaluation once we are certain as to the nature of her anemia (ensuring no bleeding) would be to undergo right and left heart catheterization for definitive evaluation of new diagnosis of cardiomyopathy.  CATH CONSENT I explained the procedure, risks, benefits alternatives and indications of both right and left heart catheterization to the patient and her son in detail.  All  questions were answered.    Risks / Complications include, but not limited to: Death, MI, CVA/TIA, VF/VT (with defibrillation), Bradycardia (need for temporary pacer placement), contrast induced nephropathy, bleeding / bruising / hematoma / pseudoaneurysm, vascular or coronary injury (with possible emergent CT or Vascular Surgery), adverse medication reactions, infection.  Additional risks involving the use of radiation with the possibility of radiation burns and cancer were explained in detail.  The patient and son voice understanding and agree to proceed.      Leonie Man, M.D., M.S. Interventional Cardiologist   Pager # [phone] Phone # [phone] [address] [place], [place] [address]

## 2015-08-07 NOTE — Progress Notes (Signed)
Nutrition Brief Note  Patient identified on the Malnutrition Screening Tool (MST) Report. Weight history below shows no significant weight changes over the past year.  Wt Readings from Last 15 Encounters:  08/07/15 224 lb 9.6 oz (101.878 kg)  08/06/15 224 lb (101.606 kg)  07/13/15 227 lb 1.6 oz (103.012 kg)  06/23/15 233 lb 3.2 oz (105.779 kg)  03/31/15 226 lb (102.513 kg)  01/13/15 231 lb 6.4 oz (104.962 kg)  12/30/14 231 lb (104.781 kg)  09/22/14 229 lb 9.6 oz (104.146 kg)  08/05/14 229 lb (103.874 kg)  07/28/14 226 lb (102.513 kg)  07/14/14 225 lb 6.4 oz (102.241 kg)  06/16/14 226 lb (102.513 kg)  03/24/14 223 lb (101.152 kg)  01/13/14 221 lb 6.4 oz (100.426 kg)  12/18/13 221 lb (100.245 kg)    Body mass index is 37.38 kg/(m^2). Patient meets criteria for class 2 obesity based on current BMI.   Current diet order is heart healthy. Labs and medications reviewed.   No nutrition interventions warranted at this time. If nutrition issues arise, please consult RD.   Molli Barrows, RD, LDN, [place] Pager [phone] After Hours Pager [phone]

## 2015-08-07 NOTE — Progress Notes (Signed)
Patient ID: [name] [name], female   DOB: [birth year], 70 y.o.   MRN: [id number]   PROGRESS NOTE    [name] [name]  [id number] DOB: September 23, 1945 DOA: 08/06/2015  PCP: Alesia Richards, MD   Outpatient Specialists:   Brief Narrative:  70 yo female with a history of LBBB, HLD, HTN, breast cancer (2015), presented with main concern of one week duration of chest pain that has been occasionally but not consistently radiating to the left jaw and left shoulder. Cardiology consulted.    Assessment & Plan:  Chest Pain with dyspnea on exertion  - two sets of troponins negative but in the setting of atrial fibrillation, certainly worrisome for angina - CT chest also worrisome for LUL PNA, given pt's report of productive cough and low grade fevers, will place on empiric Levaquin for now - ECHO pending at this time - cardiology team following  Atrial fibrillation, CHADS2 VASC score 4 ([place]) - in NSR this AM - last TSH one month ago was WNL  Anemia of chronic disease - drop in Hg since admission - will transfuse one unit of PRBC  Pericardial effusion, small - noted on CT chest, ECHO pending   Hypothyroidism - continue synthroid   Hyperlipidemia - continue statin   Obesity  - Body mass index is 37.38 kg/(m^2).   DVT prophylaxis: Lovenox SQ Code Status: Full Family Communication: Patient at bedside  Disposition Plan: Home when cleared by cardiology team   Consultants:   Cardiology   Procedures:   None  Antimicrobials:  Levaquin 4/28 -->  Subjective: Still with CP mostly under the left arm pit.   Objective: Filed Vitals:   08/06/15 1755 08/06/15 1958 08/06/15 2328 08/07/15 0354  BP: 142/64 122/56 123/50 133/67  Pulse: 86 78 81 84  Temp: 98.6 F (37 C) 98.2 F (36.8 C) 98.5 F (36.9 C) 98.4 F (36.9 C)  TempSrc: Oral Oral Oral Oral  Resp: 16 18 16 18   Height: 5\' 5"  (1.651 m)     Weight: 101.969 kg (224 lb 12.8 oz)   101.878 kg (224 lb  9.6 oz)  SpO2: 99% 98% 99% 94%   No intake or output data in the 24 hours ending 08/07/15 0713 Filed Weights   08/06/15 1755 08/07/15 0354  Weight: 101.969 kg (224 lb 12.8 oz) 101.878 kg (224 lb 9.6 oz)    Examination:  General exam: Appears calm and comfortable  Respiratory system: Clear to auscultation. Respiratory effort normal. Diminished breath sounds at bases  Cardiovascular system: S1 & S2 heard, RRR. SEM 1/6 Gastrointestinal system: Abdomen is nondistended, soft and nontender.  Central nervous system: Alert and oriented. No focal neurological deficits. Extremities: Symmetric 5 x 5 power. Skin: No rashes, lesions or ulcers Psychiatry: Judgement and insight appear normal. Mood & affect appropriate.   Data Reviewed: I have personally reviewed following labs and imaging studies  CBC:  Recent Labs Lab 08/06/15 1131 08/07/15 0450  WBC 8.3 6.8  HGB 8.2* 7.4*  HCT 25.6* 23.7*  MCV 82.6 83.2  PLT 332 [id number]   Basic Metabolic Panel:  Recent Labs Lab 08/06/15 1131 08/07/15 0450  NA 138 140  K 3.6 3.8  CL 104 107  CO2 23 25  GLUCOSE 88 101*  BUN 13 11  CREATININE 0.79 0.57  CALCIUM 9.1 8.7*   Cardiac Enzymes:  Recent Labs Lab 08/06/15 1817 08/06/15 2211  TROPONINI <0.03 <0.03   HbA1C:  Recent Labs  08/06/15 1817  HGBA1C 6.5*   Lipid Profile:  Recent Labs  08/07/15 0450  CHOL 122  HDL 39*  LDLCALC 73  TRIG 52  CHOLHDL 3.1   Anemia Panel:  Recent Labs  08/06/15 1817  FERRITIN 174  TIBC 342  IRON 12*   Urine analysis:    Component Value Date/Time   COLORURINE YELLOW 06/16/2014 1523   APPEARANCEUR CLEAR 06/16/2014 1523   LABSPEC 1.013 06/16/2014 1523   PHURINE 5.0 06/16/2014 1523   GLUCOSEU NEG 06/16/2014 1523   HGBUR NEG 06/16/2014 1523   BILIRUBINUR NEG 06/16/2014 1523   KETONESUR NEG 06/16/2014 1523   PROTEINUR NEG 06/16/2014 1523   UROBILINOGEN 0.2 06/16/2014 1523   NITRITE NEG 06/16/2014 1523   LEUKOCYTESUR NEG 06/16/2014  1523   Radiology Studies: Dg Chest 2 View 08/06/2015 Question mild pulmonary edema.   Ct Angio Chest Pe W/cm &/or Wo Cm 08/06/2015  No evidence of pulmonary embolus. 2. Mild peripheral opacity at the left upper lung lobe, concerning for mild pneumonia. 3. Small pericardial effusion noted. Would correlate for any clinical evidence of pericarditis. 4. Mild cardiomegaly. 5. Mildly enlarged 1.2 cm periaortic node noted. 6. Apparent postoperative seroma at the medial aspect of the left breast, measuring 4.7 x 2.5 cm, with adjacent postoperative change. 7. Scattered hepatic cysts are mildly increased in size. 8. Small hiatal hernia seen.   Scheduled Meds: . anastrozole  1 mg Oral Daily  . atorvastatin  40 mg Oral q1800  . cyclobenzaprine  10 mg Oral QHS  . DULoxetine  30 mg Oral TID  . enoxaparin (LOVENOX) injection  40 mg Subcutaneous Q24H  . famotidine  20 mg Oral QHS  . fluticasone  1 spray Each Nare Daily  . gabapentin  300 mg Oral QHS  . levothyroxine  50 mcg Oral QAC breakfast  . meloxicam  7.5-15 mg Oral Daily  . sucralfate  1 g Oral QID  . verapamil  240 mg Oral Daily   Continuous Infusions:  Time spent: 20 minutes   Faye Ramsay, MD [facility] Pager [phone]  If 7PM-7AM, please contact night-coverage [email or web address] Password [facility] 08/07/2015, 7:13 AM

## 2015-08-07 NOTE — Care Management Obs Status (Signed)
[place] NOTIFICATION   Patient Details  Name: SHADA SHOLTZ MRN: [id number] Date of Birth: [birth year]   Medicare Observation Status Notification Given:  Yes (Chest Pain and low Hgb)    Bethena Roys, RN 08/07/2015, 11:13 AM

## 2015-08-07 NOTE — Progress Notes (Signed)
  Echocardiogram 2D Echocardiogram has been performed.  Bobbye Charleston 08/07/2015, 2:54 PM

## 2015-08-08 DIAGNOSIS — Z888 Allergy status to other drugs, medicaments and biological substances status: Secondary | ICD-10-CM | POA: Diagnosis not present

## 2015-08-08 DIAGNOSIS — Z79899 Other long term (current) drug therapy: Secondary | ICD-10-CM | POA: Diagnosis not present

## 2015-08-08 DIAGNOSIS — I313 Pericardial effusion (noninflammatory): Secondary | ICD-10-CM | POA: Diagnosis present

## 2015-08-08 DIAGNOSIS — Z8679 Personal history of other diseases of the circulatory system: Secondary | ICD-10-CM | POA: Diagnosis not present

## 2015-08-08 DIAGNOSIS — I5042 Chronic combined systolic (congestive) and diastolic (congestive) heart failure: Secondary | ICD-10-CM | POA: Diagnosis present

## 2015-08-08 DIAGNOSIS — I10 Essential (primary) hypertension: Secondary | ICD-10-CM | POA: Diagnosis not present

## 2015-08-08 DIAGNOSIS — I110 Hypertensive heart disease with heart failure: Secondary | ICD-10-CM | POA: Diagnosis present

## 2015-08-08 DIAGNOSIS — E038 Other specified hypothyroidism: Secondary | ICD-10-CM | POA: Diagnosis not present

## 2015-08-08 DIAGNOSIS — Z6837 Body mass index (BMI) 37.0-37.9, adult: Secondary | ICD-10-CM | POA: Diagnosis not present

## 2015-08-08 DIAGNOSIS — Z881 Allergy status to other antibiotic agents status: Secondary | ICD-10-CM | POA: Diagnosis not present

## 2015-08-08 DIAGNOSIS — I5030 Unspecified diastolic (congestive) heart failure: Secondary | ICD-10-CM | POA: Diagnosis present

## 2015-08-08 DIAGNOSIS — I471 Supraventricular tachycardia: Secondary | ICD-10-CM | POA: Diagnosis present

## 2015-08-08 DIAGNOSIS — E782 Mixed hyperlipidemia: Secondary | ICD-10-CM | POA: Diagnosis not present

## 2015-08-08 DIAGNOSIS — I420 Dilated cardiomyopathy: Secondary | ICD-10-CM | POA: Diagnosis not present

## 2015-08-08 DIAGNOSIS — K219 Gastro-esophageal reflux disease without esophagitis: Secondary | ICD-10-CM | POA: Diagnosis not present

## 2015-08-08 DIAGNOSIS — G4700 Insomnia, unspecified: Secondary | ICD-10-CM | POA: Diagnosis present

## 2015-08-08 DIAGNOSIS — F329 Major depressive disorder, single episode, unspecified: Secondary | ICD-10-CM | POA: Diagnosis present

## 2015-08-08 DIAGNOSIS — D649 Anemia, unspecified: Secondary | ICD-10-CM | POA: Diagnosis not present

## 2015-08-08 DIAGNOSIS — Z9842 Cataract extraction status, left eye: Secondary | ICD-10-CM | POA: Diagnosis not present

## 2015-08-08 DIAGNOSIS — I5021 Acute systolic (congestive) heart failure: Secondary | ICD-10-CM | POA: Diagnosis not present

## 2015-08-08 DIAGNOSIS — Z853 Personal history of malignant neoplasm of breast: Secondary | ICD-10-CM | POA: Diagnosis not present

## 2015-08-08 DIAGNOSIS — R0600 Dyspnea, unspecified: Secondary | ICD-10-CM | POA: Diagnosis not present

## 2015-08-08 DIAGNOSIS — F419 Anxiety disorder, unspecified: Secondary | ICD-10-CM | POA: Diagnosis present

## 2015-08-08 DIAGNOSIS — I480 Paroxysmal atrial fibrillation: Secondary | ICD-10-CM | POA: Diagnosis present

## 2015-08-08 DIAGNOSIS — D638 Anemia in other chronic diseases classified elsewhere: Secondary | ICD-10-CM | POA: Diagnosis present

## 2015-08-08 DIAGNOSIS — J189 Pneumonia, unspecified organism: Secondary | ICD-10-CM | POA: Diagnosis present

## 2015-08-08 DIAGNOSIS — Z8249 Family history of ischemic heart disease and other diseases of the circulatory system: Secondary | ICD-10-CM | POA: Diagnosis not present

## 2015-08-08 DIAGNOSIS — Z981 Arthrodesis status: Secondary | ICD-10-CM | POA: Diagnosis not present

## 2015-08-08 DIAGNOSIS — E669 Obesity, unspecified: Secondary | ICD-10-CM | POA: Diagnosis present

## 2015-08-08 DIAGNOSIS — I429 Cardiomyopathy, unspecified: Secondary | ICD-10-CM | POA: Diagnosis not present

## 2015-08-08 DIAGNOSIS — R079 Chest pain, unspecified: Secondary | ICD-10-CM | POA: Diagnosis not present

## 2015-08-08 DIAGNOSIS — Z9841 Cataract extraction status, right eye: Secondary | ICD-10-CM | POA: Diagnosis not present

## 2015-08-08 DIAGNOSIS — M1990 Unspecified osteoarthritis, unspecified site: Secondary | ICD-10-CM | POA: Diagnosis present

## 2015-08-08 DIAGNOSIS — Z8711 Personal history of peptic ulcer disease: Secondary | ICD-10-CM | POA: Diagnosis not present

## 2015-08-08 DIAGNOSIS — E785 Hyperlipidemia, unspecified: Secondary | ICD-10-CM | POA: Diagnosis present

## 2015-08-08 DIAGNOSIS — E039 Hypothyroidism, unspecified: Secondary | ICD-10-CM | POA: Diagnosis present

## 2015-08-08 DIAGNOSIS — I447 Left bundle-branch block, unspecified: Secondary | ICD-10-CM | POA: Diagnosis present

## 2015-08-08 DIAGNOSIS — Z886 Allergy status to analgesic agent status: Secondary | ICD-10-CM | POA: Diagnosis not present

## 2015-08-08 DIAGNOSIS — Z923 Personal history of irradiation: Secondary | ICD-10-CM | POA: Diagnosis not present

## 2015-08-08 LAB — TYPE AND SCREEN
ABO/RH(D): A POS
Antibody Screen: NEGATIVE
UNIT DIVISION: 0
Unit division: 0

## 2015-08-08 LAB — CBC
HCT: 27.2 % — ABNORMAL LOW (ref 36.0–46.0)
Hemoglobin: 8.4 g/dL — ABNORMAL LOW (ref 12.0–15.0)
MCH: 26.3 pg (ref 26.0–34.0)
MCHC: 30.9 g/dL (ref 30.0–36.0)
MCV: 85 fL (ref 78.0–100.0)
PLATELETS: 326 10*3/uL (ref 150–400)
RBC: 3.2 MIL/uL — AB (ref 3.87–5.11)
RDW: 15 % (ref 11.5–15.5)
WBC: 7.2 10*3/uL (ref 4.0–10.5)

## 2015-08-08 LAB — BASIC METABOLIC PANEL
Anion gap: 8 (ref 5–15)
BUN: 13 mg/dL (ref 6–20)
CHLORIDE: 108 mmol/L (ref 101–111)
CO2: 26 mmol/L (ref 22–32)
Calcium: 8.9 mg/dL (ref 8.9–10.3)
Creatinine, Ser: 0.69 mg/dL (ref 0.44–1.00)
GFR calc Af Amer: >60 mL/min (ref >60)
GLUCOSE: 88 mg/dL (ref 65–99)
POTASSIUM: 4 mmol/L (ref 3.5–5.1)
Sodium: 142 mmol/L (ref 135–145)

## 2015-08-08 MED ORDER — PANTOPRAZOLE SODIUM 40 MG PO TBEC
40.0000 mg | DELAYED_RELEASE_TABLET | Freq: Every day | ORAL | Status: DC
  Administered 2015-08-08 – 2015-08-11 (×4): 40 mg via ORAL
  Filled 2015-08-08 (×4): qty 1

## 2015-08-08 NOTE — Progress Notes (Signed)
SUBJECTIVE:  Doing well.  Feels better.  No CP or SOB  OBJECTIVE:   Vitals:   Filed Vitals:   08/07/15 2016 08/08/15 0006 08/08/15 0320 08/08/15 0830  BP: 139/58 118/55 118/62 143/72  Pulse: 77 74 79   Temp: 98.5 F (36.9 C) 98.6 F (37 C) 98.6 F (37 C)   TempSrc: Oral Oral Oral   Resp: 18 18 18    Height:      Weight:   225 lb 12.8 oz (102.422 kg)   SpO2: 97% 97% 96% 100%   I&O's:   Intake/Output Summary (Last 24 hours) at 08/08/15 1218 Last data filed at 08/08/15 1054  Gross per 24 hour  Intake    565 ml  Output   1700 ml  Net  -1135 ml   TELEMETRY: Reviewed telemetry pt in NSR:     PHYSICAL EXAM General: Well developed, well nourished, in no acute distress Head: Eyes PERRLA, No xanthomas.   Normal cephalic and atramatic  Lungs:   Clear bilaterally to auscultation and percussion. Heart:   HRRR S1 S2 Pulses are 2+ & equal. Abdomen: Bowel sounds are positive, abdomen soft and non-tender without masses Extremities:   No clubbing, cyanosis or edema.  DP +1 Neuro: Alert and oriented X 3. Psych:  Good affect, responds appropriately   LABS: Basic Metabolic Panel:  Recent Labs  08/07/15 0450 08/08/15 0528  NA 140 142  K 3.8 4.0  CL 107 108  CO2 25 26  GLUCOSE 101* 88  BUN 11 13  CREATININE 0.57 0.69  CALCIUM 8.7* 8.9   Liver Function Tests: No results for input(s): AST, ALT, ALKPHOS, BILITOT, PROT, ALBUMIN in the last 72 hours. No results for input(s): LIPASE, AMYLASE in the last 72 hours. CBC:  Recent Labs  08/07/15 0450 08/07/15 2043 08/08/15 0528  WBC 6.8  --  7.2  HGB 7.4* 8.6* 8.4*  HCT 23.7* 26.7* 27.2*  MCV 83.2  --  85.0  PLT 307  --  326   Cardiac Enzymes:  Recent Labs  08/06/15 1817 08/06/15 2211  TROPONINI <0.03 <0.03   BNP: Invalid input(s): POCBNP D-Dimer:  Recent Labs  08/06/15 1131  DDIMER 4.42*   Hemoglobin A1C:  Recent Labs  08/06/15 1817  HGBA1C 6.5*   Fasting Lipid Panel:  Recent Labs  08/07/15 0450   CHOL 122  HDL 39*  LDLCALC 73  TRIG 52  CHOLHDL 3.1   Thyroid Function Tests: No results for input(s): TSH, T4TOTAL, T3FREE, THYROIDAB in the last 72 hours.  Invalid input(s): FREET3 Anemia Panel:  Recent Labs  08/06/15 1817  FERRITIN 174  TIBC 342  IRON 12*   Coag Panel:   Lab Results  Component Value Date   INR 0.94 10/08/2013    RADIOLOGY: Dg Chest 2 View  08/06/2015  CLINICAL DATA:  Left breast and left arm pain. Left jaw pain with dizziness and nausea for 1 week. EXAM: CHEST  2 VIEW COMPARISON:  10/19/2013.  CT chest 10/18/2013. FINDINGS: Trachea is midline. Heart size stable. There may be mild interstitial prominence and indistinctness. No pleural fluid. IMPRESSION: Question mild pulmonary edema. Electronically Signed   By: Lorin Picket M.D.   On: 08/06/2015 12:33   Ct Angio Chest Pe W/cm &/or Wo Cm  08/06/2015  CLINICAL DATA:  Acute onset of left-sided chest pain and elevated D-dimer. Dyspnea. Initial encounter. EXAM: CT ANGIOGRAPHY CHEST WITH CONTRAST TECHNIQUE: Multidetector CT imaging of the chest was performed using the standard protocol during bolus  administration of intravenous contrast. Multiplanar CT image reconstructions and MIPs were obtained to evaluate the vascular anatomy. CONTRAST:  90 mL of Isovue 370 IV contrast COMPARISON:  Chest radiograph performed earlier today at 11:50 a.m., and CTA of the chest performed 10/18/2013 FINDINGS: There is no evidence of pulmonary embolus. Mild peripheral opacity is noted at the left upper lobe, raising concern for mild pneumonia. The appearance is less typical for pulmonary infarct. No pleural effusion or pneumothorax is seen. No masses are identified; no abnormal focal contrast enhancement is seen. A small pericardial effusion is noted. Would correlate for any evidence of pericarditis. The heart is mildly enlarged. A mildly enlarged 1.2 cm periaortic node is noted. No additional mediastinal lymphadenopathy is seen. The great  vessels are grossly unremarkable in appearance. No axillary lymphadenopathy is seen. The visualized portions of the thyroid gland are unremarkable in appearance. An apparent postoperative seroma is noted at the medial aspect of the left breast, measuring approximately 4.7 x 2.5 cm, with adjacent postoperative change. Hypodensities within the liver are only minimally changed in size from 2015 and likely reflect cysts, measuring up to 2.6 cm in size. The visualized portions of the spleen are unremarkable. A small hiatal hernia is noted. The visualized portions of the pancreas and adrenal glands are within normal limits. The patient is status post cholecystectomy, with clips noted at the gallbladder fossa. No acute osseous abnormalities are seen. Review of the MIP images confirms the above findings. IMPRESSION: 1. No evidence of pulmonary embolus. 2. Mild peripheral opacity at the left upper lung lobe, concerning for mild pneumonia. 3. Small pericardial effusion noted. Would correlate for any clinical evidence of pericarditis. 4. Mild cardiomegaly. 5. Mildly enlarged 1.2 cm periaortic node noted. 6. Apparent postoperative seroma at the medial aspect of the left breast, measuring 4.7 x 2.5 cm, with adjacent postoperative change. 7. Scattered hepatic cysts are mildly increased in size. 8. Small hiatal hernia seen. Electronically Signed   By: Garald Balding M.D.   On: 08/06/2015 19:18      ASSESSMENT/PLAN:   The patient is a 70yo female with a history of LBBB as far back as 2007. It does not appear we have seen her in the clinic since 2008. She had ischemic evaluation at that time which was negative with normal LVEF. Her history also includes HLD, HTN, breast cancer(2015). She presents with chest pain, which radiates around the left side into her left arm and and jaw, along with severe fatigue/weakness. She has a lot stairs at home and a week ago she started having the pain, worse going up the stairs, and she  was exhausted after going up. She thought the pani was indigestion. Her mother died of a massive MI at age 1. She has also noticed her heart beating fast.   Atrial fibrillation ([place]) - newonset PAF Currently maintaining sinus rhythm. CHADSVASC = 4, if she does indeed have CAD. No anticoag until stool verified. No CP at the moment.  TSH WNL one month ago This patients CHA2DS2-VASc Score and unadjusted Ischemic Stroke Rate (% per year) is equal to 4.8 % stroke rate/year from a score of 4  Chest pain with High Risk for Cardiac Etiology Troponin is negative 2, however, the patient's symptoms are extremely concerning for angina. No PE by chest CT but does appear to have opacity in LUL ? PNA.  She also has new onset paroxysmal atrial fibrillation. Currently in sinus rhythm with a known left bundle branch block. This is in  the setting of anemia and as of this morning her hemoglobin has dropped to 7.4. I think this is more than just demand ischemia given the severity of her symptoms especially in light of echo showing severe LV dysfunction with EF 25-30%.  Dr. Ellyn Hack has set her up for left heart catheterization on Monday, especially since her mother died of a massive MI at age 42 and she has risk facotrs for it. However, we need to make sure she is not bleeding. She does have well-controlled lipids on 40mg  lipitor.  Continue BB/statin  Pericardial effusion, small to moderate by echo with no tamponade  Identified on CT. Echo pending  Dyspnea - multifactorial. Potentially related to anemia as well as heart failure symptoms with possible ischemia.  2D echo with severe LV dysfunction and severe global HK.  Continue BB/diuretic/  Hold ACE I until after cath.   Chronic anemia - she has a history of PUD -Hgb on 03/31/15: 10.1, 07/13/15: 9.2, yesterday 8.2 and today 8.4. Rectal was heme negative.  Occult blood stool pending.  Consider giving PRBCs: definitely if she goes below 7.0. Iron is low-  12. Colonoscopy in 2014 with diverticulosis. Recommend GI consult over weekend.   Hyperlipidemia On statin  Breast CA s/p XRT 2 years ago    Sueanne Margarita, MD  08/08/2015  12:18 PM

## 2015-08-08 NOTE — Progress Notes (Signed)
Patient c/o chest pressure relieved by Norco 2 tabs. She is currently in bed with eyes closed. Will continue to monitor.

## 2015-08-08 NOTE — Progress Notes (Addendum)
Patient ID: [name] [name], female   DOB: 1945/07/10, 70 y.o.   MRN: [id number]   PROGRESS NOTE    [name] [name]  [id number] DOB: [birth year] DOA: 08/06/2015  PCP: Alesia Richards, MD   Outpatient Specialists:   Brief Narrative:  70 yo female with a history of LBBB, HLD, HTN, breast cancer (2015), presented with main concern of one week duration of chest pain that has been occasionally but not consistently radiating to the left jaw and left shoulder. Cardiology consulted.    Assessment & Plan:  Chest Pain with dyspnea on exertion  - two sets of troponins negative but in the setting of atrial fibrillation, certainly worrisome for angina - CT chest also worrisome for LUL PNA, given pt's report of productive cough and low grade fevers, cont on empiric Levaquin for now - ECHO showing severe LV dysfunction and EF 25 - 30 % - plan for LHC on Monday  - cardiology team following, appreciate assistance   Atrial fibrillation, CHADS2 VASC score 4 ([place]) - in NSR this AM - last TSH one month ago was WNL  Pericardial effusion, small to moderate by echo with no tamponade - Identified on CT  Breast CA s/p XRT 2 years ago  Anemia of chronic disease, IDA - transfused one U PRBC and Hg up from 7.4 --> 8.4 - iron level low at 12, place on supplementation  - FOBT pending - due to hx of PUD, will place on Protonix for now - GI consulted   Hypothyroidism - continue synthroid   Hyperlipidemia - continue statin   Obesity  - Body mass index is 37.38 kg/(m^2).  DVT prophylaxis: Lovenox SQ Code Status: Full Family Communication: Patient at bedside  Disposition Plan: Home when cleared by cardiology team, plan for [facility] on Monday   Consultants:   Cardiology   GI  Procedures:   None  Antimicrobials:  Levaquin 4/28 -->  It is my opinion that pt will require at least 3-4 days of hospital stay. She has severe LV  Dysfunction adn EF 25 - 30% now ? New LUL  PNA, pericardial effusion. She has also developed atrial fib with RVR. Hg was low on admission requiring blood transfusion.Her multiple complex medical conditions require multidisciplinary team involvement including cardiology, GI, internal medicine. Pt is unsafe to be discharged at this time and currently we are unable to determine appropriate length of stay due to complexity of medical issues. She needs LHC which is planned for Monday but we are first awaiting for GI input. Please call me with questions. Dr. Doyle Askew [phone]   Subjective: Still with CP but better.   Objective: Filed Vitals:   08/07/15 1847 08/07/15 2016 08/08/15 0006 08/08/15 0320  BP: 133/63 139/58 118/55 118/62  Pulse: 72 77 74 79  Temp:  98.5 F (36.9 C) 98.6 F (37 C) 98.6 F (37 C)  TempSrc: Oral Oral Oral Oral  Resp: 18 18 18 18   Height:      Weight:    102.422 kg (225 lb 12.8 oz)  SpO2: 98% 97% 97% 96%    Intake/Output Summary (Last 24 hours) at 08/08/15 0754 Last data filed at 08/07/15 1847  Gross per 24 hour  Intake    565 ml  Output    500 ml  Net     65 ml   Filed Weights   08/06/15 1755 08/07/15 0354 08/08/15 0320  Weight: 101.969 kg (224 lb 12.8 oz) 101.878 kg (224 lb 9.6 oz) 102.422 kg (225  lb 12.8 oz)    Examination:  General exam: Appears calm and comfortable  Respiratory system: Clear to auscultation. Respiratory effort normal. Diminished breath sounds at bases  Cardiovascular system: S1 & S2 heard, RRR. SEM 1/6 Gastrointestinal system: Abdomen is nondistended, soft and nontender.  Central nervous system: Alert and oriented. No focal neurological deficits. Extremities: Symmetric 5 x 5 power. Skin: No rashes, lesions or ulcers Psychiatry: Judgement and insight appear normal. Mood & affect appropriate.   Data Reviewed: I have personally reviewed following labs and imaging studies  CBC:  Recent Labs Lab 08/06/15 1131 08/07/15 0450 08/07/15 2043 08/08/15 0528  WBC 8.3 6.8  --   7.2  HGB 8.2* 7.4* 8.6* 8.4*  HCT 25.6* 23.7* 26.7* 27.2*  MCV 82.6 83.2  --  85.0  PLT 332 307  --  [id number]   Basic Metabolic Panel:  Recent Labs Lab 08/06/15 1131 08/07/15 0450  NA 138 140  K 3.6 3.8  CL 104 107  CO2 23 25  GLUCOSE 88 101*  BUN 13 11  CREATININE 0.79 0.57  CALCIUM 9.1 8.7*   Cardiac Enzymes:  Recent Labs Lab 08/06/15 1817 08/06/15 2211  TROPONINI <0.03 <0.03   HbA1C:  Recent Labs  08/06/15 1817  HGBA1C 6.5*   Lipid Profile:  Recent Labs  08/07/15 0450  CHOL 122  HDL 39*  LDLCALC 73  TRIG 52  CHOLHDL 3.1   Anemia Panel:  Recent Labs  08/06/15 1817  FERRITIN 174  TIBC 342  IRON 12*   Urine analysis:    Component Value Date/Time   COLORURINE YELLOW 06/16/2014 1523   APPEARANCEUR CLEAR 06/16/2014 1523   LABSPEC 1.013 06/16/2014 1523   PHURINE 5.0 06/16/2014 1523   GLUCOSEU NEG 06/16/2014 1523   HGBUR NEG 06/16/2014 1523   BILIRUBINUR NEG 06/16/2014 1523   KETONESUR NEG 06/16/2014 1523   PROTEINUR NEG 06/16/2014 1523   UROBILINOGEN 0.2 06/16/2014 1523   NITRITE NEG 06/16/2014 1523   LEUKOCYTESUR NEG 06/16/2014 1523   Radiology Studies: Dg Chest 2 View 08/06/2015 Question mild pulmonary edema.   Ct Angio Chest Pe W/cm &/or Wo Cm 08/06/2015  No evidence of pulmonary embolus. 2. Mild peripheral opacity at the left upper lung lobe, concerning for mild pneumonia. 3. Small pericardial effusion noted. Would correlate for any clinical evidence of pericarditis. 4. Mild cardiomegaly. 5. Mildly enlarged 1.2 cm periaortic node noted. 6. Apparent postoperative seroma at the medial aspect of the left breast, measuring 4.7 x 2.5 cm, with adjacent postoperative change. 7. Scattered hepatic cysts are mildly increased in size. 8. Small hiatal hernia seen.   Scheduled Meds: . anastrozole  1 mg Oral Daily  . atorvastatin  40 mg Oral q1800  . carvedilol  6.25 mg Oral BID WC  . cyclobenzaprine  10 mg Oral QHS  . DULoxetine  30 mg Oral TID  .  enoxaparin (LOVENOX) injection  40 mg Subcutaneous Q24H  . famotidine  20 mg Oral QHS  . fluticasone  1 spray Each Nare Daily  . furosemide  20 mg Oral BID  . gabapentin  300 mg Oral QHS  . levofloxacin (LEVAQUIN) IV  750 mg Intravenous Q24H  . levothyroxine  50 mcg Oral QAC breakfast  . meloxicam  7.5-15 mg Oral Daily  . sucralfate  1 g Oral QID   Continuous Infusions:  Time spent: 20 minutes   Faye Ramsay, MD [facility] Pager [phone]  If 7PM-7AM, please contact night-coverage [email or web address] Password [facility] 08/08/2015, 7:54 AM

## 2015-08-09 DIAGNOSIS — K219 Gastro-esophageal reflux disease without esophagitis: Secondary | ICD-10-CM

## 2015-08-09 DIAGNOSIS — E038 Other specified hypothyroidism: Secondary | ICD-10-CM

## 2015-08-09 DIAGNOSIS — I5021 Acute systolic (congestive) heart failure: Secondary | ICD-10-CM

## 2015-08-09 DIAGNOSIS — R0600 Dyspnea, unspecified: Secondary | ICD-10-CM

## 2015-08-09 LAB — CBC
HEMATOCRIT: 30.6 % — AB (ref 36.0–46.0)
HEMOGLOBIN: 9.8 g/dL — AB (ref 12.0–15.0)
MCH: 26.6 pg (ref 26.0–34.0)
MCHC: 32 g/dL (ref 30.0–36.0)
MCV: 83.2 fL (ref 78.0–100.0)
Platelets: 370 10*3/uL (ref 150–400)
RBC: 3.68 MIL/uL — ABNORMAL LOW (ref 3.87–5.11)
RDW: 14.8 % (ref 11.5–15.5)
WBC: 8.1 10*3/uL (ref 4.0–10.5)

## 2015-08-09 LAB — BASIC METABOLIC PANEL
Anion gap: 9 (ref 5–15)
BUN: 10 mg/dL (ref 6–20)
CALCIUM: 9 mg/dL (ref 8.9–10.3)
CHLORIDE: 105 mmol/L (ref 101–111)
CO2: 25 mmol/L (ref 22–32)
CREATININE: 0.7 mg/dL (ref 0.44–1.00)
GFR calc Af Amer: >60 mL/min (ref >60)
GFR calc non Af Amer: >60 mL/min (ref >60)
GLUCOSE: 101 mg/dL — AB (ref 65–99)
Potassium: 3.9 mmol/L (ref 3.5–5.1)
Sodium: 139 mmol/L (ref 135–145)

## 2015-08-09 LAB — OCCULT BLOOD X 1 CARD TO LAB, STOOL: Fecal Occult Bld: NEGATIVE

## 2015-08-09 MED ORDER — NITROGLYCERIN IN D5W 200-5 MCG/ML-% IV SOLN
2.0000 ug/min | INTRAVENOUS | Status: DC
  Administered 2015-08-09: 5 ug/min via INTRAVENOUS
  Filled 2015-08-09: qty 250

## 2015-08-09 MED ORDER — FERROUS SULFATE 325 (65 FE) MG PO TABS
325.0000 mg | ORAL_TABLET | Freq: Two times a day (BID) | ORAL | Status: DC
  Administered 2015-08-09 – 2015-08-11 (×4): 325 mg via ORAL
  Filled 2015-08-09 (×4): qty 1

## 2015-08-09 MED ORDER — ASPIRIN EC 81 MG PO TBEC
81.0000 mg | DELAYED_RELEASE_TABLET | Freq: Every day | ORAL | Status: DC
  Administered 2015-08-09 – 2015-08-11 (×3): 81 mg via ORAL
  Filled 2015-08-09 (×3): qty 1

## 2015-08-09 NOTE — Progress Notes (Signed)
Patient ID: [name] [name], female   DOB: October 11, 1945, 70 y.o.   MRN: [id number]   PROGRESS NOTE    ITZA CALECA  [id number] DOB: [birth year] DOA: 08/06/2015  PCP: Alesia Richards, MD   Outpatient Specialists:   Brief Narrative:  70 yo female with a history of LBBB, HLD, HTN, breast cancer (2015), presented with main concern of one week duration of chest pain that has been occasionally but not consistently radiating to the left jaw and left shoulder. Cardiology consulted.    Assessment & Plan:  Chest Pain with dyspnea on exertion  - two sets of troponins negative but in the setting of atrial fibrillation, certainly worrisome for angina - CT chest also worrisome for LUL PNA, given pt's report of productive cough and low grade fevers, cont on empiric Levaquin for now - ECHO showing severe LV dysfunction and EF 25 - 30 % - plan for LHC on Monday  - cardiology team following, appreciate assistance  - aspirin started   Atrial fibrillation, CHADS2 VASC score 4 ([place]) - in NSR this AM - last TSH one month ago was WNL - per cardiology   Pericardial effusion, small to moderate by echo with no tamponade - Identified on CT  Breast CA s/p XRT 2 years ago  Anemia of chronic disease, IDA, perhaps related to hx of breast cancer  - transfused one U PRBC and Hg up from 7.4 --> 8.4 --> 9.8 - iron level low at 12, placed on supplementation  - FOBT negative - spoke with GI on call, pt had last colonoscopy in 2014 with diverticulosis, otherwise unremarkable exam, has chronic anemia with baseline Hg 7 -9 in the past 2 years - recommendation is to continue PPI and Iron supplement with no indication for colonoscopy of EGD  - transfuse to keep Hg > 8  Hypothyroidism - continue synthroid   Hyperlipidemia - continue statin   Obesity  - Body mass index is 37.38 kg/(m^2).  DVT prophylaxis: Lovenox SQ Code Status: Full Family Communication: Patient at bedside    Disposition Plan: Home when cleared by cardiology team, plan for [facility] on Monday   Consultants:   Cardiology   GI over the phone   Procedures:   None  Antimicrobials:  [place] 4/28 -->  It is my opinion that pt will require at least 3-4 days of hospital stay. She has severe LV  Dysfunction adn EF 25 - 30% now ? New LUL PNA, pericardial effusion. She has also developed atrial fib with RVR. Hg was low on admission requiring blood transfusion.Her multiple complex medical conditions require multidisciplinary team involvement including cardiology, GI, internal medicine. Pt is unsafe to be discharged at this time and currently we are unable to determine appropriate length of stay due to complexity of medical issues. She needs LHC which is planned for Monday but we are first awaiting for GI input. Please call me with questions. Dr. Doyle Askew [phone]   Subjective: Still with CP but better, some left back pain this AM as well.  Objective: Filed Vitals:   08/08/15 2048 08/09/15 0004 08/09/15 0342 08/09/15 1157  BP: 142/72 145/66 142/61 114/59  Pulse: 85 93 94 84  Temp: 99.9 F (37.7 C) 98.9 F (37.2 C) 99 F (37.2 C) 98.7 F (37.1 C)  TempSrc: Oral Oral Oral Oral  Resp:  16 14 18   Height:      Weight:  100.7 kg (222 lb 0.1 oz)    SpO2: 98% 96% 97% 98%  Intake/Output Summary (Last 24 hours) at 08/09/15 1211 Last data filed at 08/09/15 0913  Gross per 24 hour  Intake   1560 ml  Output      0 ml  Net   1560 ml   Filed Weights   08/07/15 0354 08/08/15 0320 08/09/15 0004  Weight: 101.878 kg (224 lb 9.6 oz) 102.422 kg (225 lb 12.8 oz) 100.7 kg (222 lb 0.1 oz)    Examination:  General exam: Appears calm and comfortable  Respiratory system: Clear to auscultation. Respiratory effort normal. Diminished breath sounds at bases  Cardiovascular system: S1 & S2 heard, RRR. SEM 1/6 Gastrointestinal system: Abdomen is nondistended, soft and nontender.  Central nervous system: Alert  and oriented. No focal neurological deficits. Extremities: Symmetric 5 x 5 power. Skin: No rashes, lesions or ulcers Psychiatry: Judgement and insight appear normal. Mood & affect appropriate.   Data Reviewed: I have personally reviewed following labs and imaging studies  CBC:  Recent Labs Lab 08/06/15 1131 08/07/15 0450 08/07/15 2043 08/08/15 0528 08/09/15 0652  WBC 8.3 6.8  --  7.2 8.1  HGB 8.2* 7.4* 8.6* 8.4* 9.8*  HCT 25.6* 23.7* 26.7* 27.2* 30.6*  MCV 82.6 83.2  --  85.0 83.2  PLT 332 307  --  326 0000000   Basic Metabolic Panel:  Recent Labs Lab 08/06/15 1131 08/07/15 0450 08/08/15 0528 08/09/15 0652  NA 138 140 142 139  K 3.6 3.8 4.0 3.9  CL 104 107 108 105  CO2 23 25 26 25   GLUCOSE 88 [id number]* 88 101*  BUN 13 11 13 10   CREATININE 0.79 0.57 0.69 0.70  CALCIUM 9.1 8.7* 8.9 9.0   Cardiac Enzymes:  Recent Labs Lab 08/06/15 1817 08/06/15 2211  TROPONINI <0.03 <0.03   HbA1C:  Recent Labs  08/06/15 1817  HGBA1C 6.5*   Lipid Profile:  Recent Labs  08/07/15 0450  CHOL 122  HDL 39*  LDLCALC 73  TRIG 52  CHOLHDL 3.1   Anemia Panel:  Recent Labs  08/06/15 1817  FERRITIN 174  TIBC 342  IRON 12*   Urine analysis:    Component Value Date/Time   COLORURINE YELLOW 06/16/2014 1523   APPEARANCEUR CLEAR 06/16/2014 1523   LABSPEC 1.013 06/16/2014 1523   PHURINE 5.0 06/16/2014 1523   GLUCOSEU NEG 06/16/2014 1523   HGBUR NEG 06/16/2014 1523   BILIRUBINUR NEG 06/16/2014 1523   KETONESUR NEG 06/16/2014 1523   PROTEINUR NEG 06/16/2014 1523   UROBILINOGEN 0.2 06/16/2014 1523   NITRITE NEG 06/16/2014 1523   LEUKOCYTESUR NEG 06/16/2014 1523   Radiology Studies: Dg Chest 2 View 08/06/2015 Question mild pulmonary edema.   Ct Angio Chest Pe W/cm &/or Wo Cm 08/06/2015  No evidence of pulmonary embolus. 2. Mild peripheral opacity at the left upper lung lobe, concerning for mild pneumonia. 3. Small pericardial effusion noted. Would correlate for any clinical  evidence of pericarditis. 4. Mild cardiomegaly. 5. Mildly enlarged 1.2 cm periaortic node noted. 6. Apparent postoperative seroma at the medial aspect of the left breast, measuring 4.7 x 2.5 cm, with adjacent postoperative change. 7. Scattered hepatic cysts are mildly increased in size. 8. Small hiatal hernia seen.   Scheduled Meds: . anastrozole  1 mg Oral Daily  . aspirin EC  81 mg Oral Daily  . atorvastatin  40 mg Oral q1800  . carvedilol  6.25 mg Oral BID WC  . cyclobenzaprine  10 mg Oral QHS  . DULoxetine  30 mg Oral TID  . enoxaparin (LOVENOX) injection  40 mg Subcutaneous Q24H  . famotidine  20 mg Oral QHS  . fluticasone  1 spray Each Nare Daily  . furosemide  20 mg Oral BID  . gabapentin  300 mg Oral QHS  . levofloxacin (LEVAQUIN) IV  750 mg Intravenous Q24H  . levothyroxine  50 mcg Oral QAC breakfast  . meloxicam  7.5-15 mg Oral Daily  . pantoprazole  40 mg Oral Daily  . sucralfate  1 g Oral QID   Continuous Infusions:  Time spent: 20 minutes   Faye Ramsay, MD [facility] Pager [phone]  If 7PM-7AM, please contact night-coverage [email or web address] Password [email or web address] 08/09/2015, 12:11 PM

## 2015-08-09 NOTE — Progress Notes (Addendum)
SUBJECTIVE:  Had CP this am while up ambulating in the room  OBJECTIVE:   Vitals:   Filed Vitals:   08/08/15 1615 08/08/15 2048 08/09/15 0004 08/09/15 0342  BP: 127/8 142/72 145/66 142/61  Pulse: 94 85 93 94  Temp: 98.8 F (37.1 C) 99.9 F (37.7 C) 98.9 F (37.2 C) 99 F (37.2 C)  TempSrc: Oral Oral Oral Oral  Resp:   16 14  Height:      Weight:   222 lb 0.1 oz (100.7 kg)   SpO2: 98% 98% 96% 97%   I&O's:   Intake/Output Summary (Last 24 hours) at 08/09/15 1020 Last data filed at 08/09/15 0913  Gross per 24 hour  Intake   1560 ml  Output    200 ml  Net   1360 ml   TELEMETRY: Reviewed telemetry pt in NSR:     PHYSICAL EXAM General: Well developed, well nourished, in no acute distress Head: Eyes PERRLA, No xanthomas.   Normal cephalic and atramatic  Lungs:   Clear bilaterally to auscultation and percussion. Heart:   HRRR S1 S2 Pulses are 2+ & equal. Abdomen: Bowel sounds are positive, abdomen soft and non-tender without masses Extremities:   No clubbing, cyanosis or edema.  DP +1 Neuro: Alert and oriented X 3. Psych:  Good affect, responds appropriately   LABS: Basic Metabolic Panel:  Recent Labs  08/08/15 0528 08/09/15 0652  NA 142 139  K 4.0 3.9  CL 108 105  CO2 26 25  GLUCOSE 88 101*  BUN 13 10  CREATININE 0.69 0.70  CALCIUM 8.9 9.0   Liver Function Tests: No results for input(s): AST, ALT, ALKPHOS, BILITOT, PROT, ALBUMIN in the last 72 hours. No results for input(s): LIPASE, AMYLASE in the last 72 hours. CBC:  Recent Labs  08/08/15 0528 08/09/15 0652  WBC 7.2 8.1  HGB 8.4* 9.8*  HCT 27.2* 30.6*  MCV 85.0 83.2  PLT 326 370   Cardiac Enzymes:  Recent Labs  08/06/15 1817 08/06/15 2211  TROPONINI <0.03 <0.03   BNP: Invalid input(s): POCBNP D-Dimer:  Recent Labs  08/06/15 1131  DDIMER 4.42*   Hemoglobin A1C:  Recent Labs  08/06/15 1817  HGBA1C 6.5*   Fasting Lipid Panel:  Recent Labs  08/07/15 0450  CHOL 122  HDL  39*  LDLCALC 73  TRIG 52  CHOLHDL 3.1   Thyroid Function Tests: No results for input(s): TSH, T4TOTAL, T3FREE, THYROIDAB in the last 72 hours.  Invalid input(s): FREET3 Anemia Panel:  Recent Labs  08/06/15 1817  FERRITIN 174  TIBC 342  IRON 12*   Coag Panel:   Lab Results  Component Value Date   INR 0.94 10/08/2013    RADIOLOGY: Dg Chest 2 View  08/06/2015  CLINICAL DATA:  Left breast and left arm pain. Left jaw pain with dizziness and nausea for 1 week. EXAM: CHEST  2 VIEW COMPARISON:  10/19/2013.  CT chest 10/18/2013. FINDINGS: Trachea is midline. Heart size stable. There may be mild interstitial prominence and indistinctness. No pleural fluid. IMPRESSION: Question mild pulmonary edema. Electronically Signed   By: Lorin Picket M.D.   On: 08/06/2015 12:33   Ct Angio Chest Pe W/cm &/or Wo Cm  08/06/2015  CLINICAL DATA:  Acute onset of left-sided chest pain and elevated D-dimer. Dyspnea. Initial encounter. EXAM: CT ANGIOGRAPHY CHEST WITH CONTRAST TECHNIQUE: Multidetector CT imaging of the chest was performed using the standard protocol during bolus administration of intravenous contrast. Multiplanar CT image reconstructions and  MIPs were obtained to evaluate the vascular anatomy. CONTRAST:  90 mL of Isovue 370 IV contrast COMPARISON:  Chest radiograph performed earlier today at 11:50 a.m., and CTA of the chest performed 10/18/2013 FINDINGS: There is no evidence of pulmonary embolus. Mild peripheral opacity is noted at the left upper lobe, raising concern for mild pneumonia. The appearance is less typical for pulmonary infarct. No pleural effusion or pneumothorax is seen. No masses are identified; no abnormal focal contrast enhancement is seen. A small pericardial effusion is noted. Would correlate for any evidence of pericarditis. The heart is mildly enlarged. A mildly enlarged 1.2 cm periaortic node is noted. No additional mediastinal lymphadenopathy is seen. The great vessels are  grossly unremarkable in appearance. No axillary lymphadenopathy is seen. The visualized portions of the thyroid gland are unremarkable in appearance. An apparent postoperative seroma is noted at the medial aspect of the left breast, measuring approximately 4.7 x 2.5 cm, with adjacent postoperative change. Hypodensities within the liver are only minimally changed in size from 2015 and likely reflect cysts, measuring up to 2.6 cm in size. The visualized portions of the spleen are unremarkable. A small hiatal hernia is noted. The visualized portions of the pancreas and adrenal glands are within normal limits. The patient is status post cholecystectomy, with clips noted at the gallbladder fossa. No acute osseous abnormalities are seen. Review of the MIP images confirms the above findings. IMPRESSION: 1. No evidence of pulmonary embolus. 2. Mild peripheral opacity at the left upper lung lobe, concerning for mild pneumonia. 3. Small pericardial effusion noted. Would correlate for any clinical evidence of pericarditis. 4. Mild cardiomegaly. 5. Mildly enlarged 1.2 cm periaortic node noted. 6. Apparent postoperative seroma at the medial aspect of the left breast, measuring 4.7 x 2.5 cm, with adjacent postoperative change. 7. Scattered hepatic cysts are mildly increased in size. 8. Small hiatal hernia seen. Electronically Signed   By: Garald Balding M.D.   On: 08/06/2015 19:18    ASSESSMENT/PLAN:  The patient is a 70yo female with a history of LBBB as far back as 2007. It does not appear we have seen her in the clinic since 2008. She had ischemic evaluation at that time which was negative with normal LVEF. Her history also includes HLD, HTN, breast cancer(2015). She presents with chest pain, which radiates around the left side into her left arm and and jaw, along with severe fatigue/weakness. She has a lot stairs at home and a week ago she started having the pain, worse going up the stairs, and she was exhausted  after going up. She thought the pani was indigestion. Her mother died of a massive MI at age 95. She has also noticed her heart beating fast.   Atrial fibrillation ([place]) - newonset PAF Currently maintaining sinus rhythm. CHADSVASC = 4, if she does indeed have CAD. Stool hemoccult negative for blood.   No CP at the moment.  TSH WNL one month ago This patients CHA2DS2-VASc Score and unadjusted Ischemic Stroke Rate (% per year) is equal to 4.8 % stroke rate/year from a score of 4  Chest pain with High Risk for Cardiac Etiology Troponin is negative 2, however, the patient's symptoms are extremely concerning for angina. No PE by chest CT but does appear to have opacity in LUL ? PNA. She also has new onset paroxysmal atrial fibrillation. Currently in sinus rhythm with a known left bundle branch block. This is in the setting of anemia. I think this is more than just  demand ischemia given the severity of her symptoms especially in light of echo showing severe LV dysfunction with EF 25-30%. Dr. Ellyn Hack has set her up for left heart catheterization on Monday, especially since her mother died of a massive MI at age 63 and she has risk facotrs for it. She does have well-controlled lipids on 40mg  lipitor. Continue BB/statin. ASA on hold due to anemia but since she has had more CP and hemoccult is negative, will start ASA 81mg  daily and start IV NTG gtt.  Pericardial effusion, small to moderate by echo with no tamponade  Identified on CT and echo  Dyspnea - multifactorial. Potentially related to anemia as well as heart failure symptoms with possible ischemia. 2D echo with severe LV dysfunction and severe global HK. Continue BB/diuretic. Hold ACE I until after cath.   Chronic anemia - she has a history of PUD -Hgb on 03/31/15: 10.1, 07/13/15: 9.2, yesterday 8.4 and today  9.8. Rectal was heme negative. Occult blood stool negative. Iron is low- 12. Colonoscopy in 2014 with diverticulosis.  Recommend GI consult over weekend prior to cath.  Hyperlipidemia On statin  Breast CA s/p XRT 2 years ago   Sueanne Margarita, MD  08/09/2015  10:20 AM

## 2015-08-09 NOTE — Progress Notes (Signed)
Pt does not wish to go on IV nitro, she states it will give her a headache but she does not mind being placed on it if the Chest Pain continues. She currently report 0/10 pain. Will reassess

## 2015-08-10 ENCOUNTER — Encounter (HOSPITAL_COMMUNITY)
Admission: EM | Disposition: A | Discharge status: Home / Self Care | Payer: Self-pay | Source: Home / Self Care | Attending: Internal Medicine

## 2015-08-10 DIAGNOSIS — I428 Other cardiomyopathies: Secondary | ICD-10-CM | POA: Insufficient documentation

## 2015-08-10 DIAGNOSIS — I420 Dilated cardiomyopathy: Secondary | ICD-10-CM

## 2015-08-10 HISTORY — PX: CARDIAC CATHETERIZATION: SHX172

## 2015-08-10 LAB — POCT I-STAT 3, ART BLOOD GAS (G3+)
Acid-Base Excess: 2 mmol/L (ref 0.0–2.0)
Bicarbonate: 28.1 mEq/L — ABNORMAL HIGH (ref 20.0–24.0)
O2 SAT: 91 %
PCO2 ART: 47.9 mmHg — AB (ref 35.0–45.0)
TCO2: 30 mmol/L (ref 0–100)
pH, Arterial: 7.376 (ref 7.350–7.450)
pO2, Arterial: 63 mmHg — ABNORMAL LOW (ref 80.0–100.0)

## 2015-08-10 LAB — POCT I-STAT 3, VENOUS BLOOD GAS (G3P V)
ACID-BASE EXCESS: 3 mmol/L — AB (ref 0.0–2.0)
Bicarbonate: 28.1 mEq/L — ABNORMAL HIGH (ref 20.0–24.0)
O2 SAT: 69 %
PCO2 VEN: 44.7 mmHg — AB (ref 45.0–50.0)
PH VEN: 7.406 — AB (ref 7.250–7.300)
TCO2: 29 mmol/L (ref 0–100)
pO2, Ven: 36 mmHg (ref 31.0–45.0)

## 2015-08-10 LAB — CBC
HEMATOCRIT: 31.1 % — AB (ref 36.0–46.0)
HEMOGLOBIN: 10 g/dL — AB (ref 12.0–15.0)
MCH: 27 pg (ref 26.0–34.0)
MCHC: 32.2 g/dL (ref 30.0–36.0)
MCV: 83.8 fL (ref 78.0–100.0)
Platelets: 378 10*3/uL (ref 150–400)
RBC: 3.71 MIL/uL — ABNORMAL LOW (ref 3.87–5.11)
RDW: 14.9 % (ref 11.5–15.5)
WBC: 7.5 10*3/uL (ref 4.0–10.5)

## 2015-08-10 LAB — BASIC METABOLIC PANEL
Anion gap: 10 (ref 5–15)
BUN: 13 mg/dL (ref 6–20)
CHLORIDE: 103 mmol/L (ref 101–111)
CO2: 25 mmol/L (ref 22–32)
CREATININE: 0.67 mg/dL (ref 0.44–1.00)
Calcium: 9.1 mg/dL (ref 8.9–10.3)
GFR calc Af Amer: >60 mL/min (ref >60)
GFR calc non Af Amer: >60 mL/min (ref >60)
Glucose, Bld: 99 mg/dL (ref 65–99)
Potassium: 3.9 mmol/L (ref 3.5–5.1)
Sodium: 138 mmol/L (ref 135–145)

## 2015-08-10 LAB — PROTIME-INR
INR: 1.22 (ref 0.00–1.49)
Prothrombin Time: 15.6 s — ABNORMAL HIGH (ref 11.6–15.2)

## 2015-08-10 SURGERY — RIGHT/LEFT HEART CATH AND CORONARY ANGIOGRAPHY

## 2015-08-10 MED ORDER — OFF THE BEAT BOOK
Freq: Once | Status: DC
  Filled 2015-08-10: qty 1

## 2015-08-10 MED ORDER — SODIUM CHLORIDE 0.9 % IV SOLN: 250.0000 mL | INTRAVENOUS | Status: DC | PRN

## 2015-08-10 MED ORDER — MIDAZOLAM HCL 2 MG/2ML IJ SOLN
INTRAMUSCULAR | Status: AC
  Filled 2015-08-10: qty 2

## 2015-08-10 MED ORDER — SODIUM CHLORIDE 0.9 % IV SOLN
INTRAVENOUS | Status: DC
  Administered 2015-08-10: 11:00:00 via INTRAVENOUS

## 2015-08-10 MED ORDER — ASPIRIN 81 MG PO CHEW: 81.0000 mg | CHEWABLE_TABLET | ORAL | Status: AC

## 2015-08-10 MED ORDER — LORAZEPAM 2 MG/ML IJ SOLN
1.0000 mg | Freq: Once | INTRAMUSCULAR | Status: AC
  Administered 2015-08-10: 1 mg via INTRAVENOUS
  Filled 2015-08-10: qty 1

## 2015-08-10 MED ORDER — HEPARIN SODIUM (PORCINE) 1000 UNIT/ML IJ SOLN
INTRAMUSCULAR | Status: AC
  Filled 2015-08-10: qty 1

## 2015-08-10 MED ORDER — HEPARIN (PORCINE) IN NACL 2-0.9 UNIT/ML-% IJ SOLN
INTRAMUSCULAR | Status: AC
  Filled 2015-08-10: qty 1000

## 2015-08-10 MED ORDER — SODIUM CHLORIDE 0.9% FLUSH: 3.0000 mL | INTRAVENOUS | Status: DC | PRN

## 2015-08-10 MED ORDER — LIDOCAINE HCL (PF) 1 % IJ SOLN
INTRAMUSCULAR | Status: DC | PRN
  Administered 2015-08-10: 15 mL
  Administered 2015-08-10: 2 mL

## 2015-08-10 MED ORDER — SODIUM CHLORIDE 0.9% FLUSH
3.0000 mL | Freq: Two times a day (BID) | INTRAVENOUS | Status: DC
  Administered 2015-08-11: 3 mL via INTRAVENOUS

## 2015-08-10 MED ORDER — VERAPAMIL HCL 2.5 MG/ML IV SOLN
INTRAVENOUS | Status: AC
  Filled 2015-08-10: qty 2

## 2015-08-10 MED ORDER — LIDOCAINE HCL (PF) 1 % IJ SOLN
INTRAMUSCULAR | Status: AC
  Filled 2015-08-10: qty 30

## 2015-08-10 MED ORDER — HEPARIN (PORCINE) IN NACL 2-0.9 UNIT/ML-% IJ SOLN
INTRAMUSCULAR | Status: DC | PRN
Start: 2015-08-10 — End: 2015-08-10
  Administered 2015-08-10: 1000 mL

## 2015-08-10 MED ORDER — IOPAMIDOL (ISOVUE-370) INJECTION 76%
INTRAVENOUS | Status: DC | PRN
  Administered 2015-08-10: 50 mL via INTRAVENOUS

## 2015-08-10 MED ORDER — SODIUM CHLORIDE 0.9% FLUSH: 3.0000 mL | Freq: Two times a day (BID) | INTRAVENOUS | Status: DC

## 2015-08-10 MED ORDER — MIDAZOLAM HCL 2 MG/2ML IJ SOLN
INTRAMUSCULAR | Status: DC | PRN
  Administered 2015-08-10 (×3): 1 mg via INTRAVENOUS

## 2015-08-10 MED ORDER — FENTANYL CITRATE (PF) 100 MCG/2ML IJ SOLN
INTRAMUSCULAR | Status: AC
  Filled 2015-08-10: qty 2

## 2015-08-10 MED ORDER — SODIUM CHLORIDE 0.9 % IV SOLN: INTRAVENOUS | Status: AC

## 2015-08-10 MED ORDER — FENTANYL CITRATE (PF) 100 MCG/2ML IJ SOLN
INTRAMUSCULAR | Status: DC | PRN
  Administered 2015-08-10: 50 ug via INTRAVENOUS
  Administered 2015-08-10: 25 ug via INTRAVENOUS

## 2015-08-10 SURGICAL SUPPLY — 10 items
CATH INFINITI 5 FR JL3.5 (CATHETERS) ×3 IMPLANT
CATH INFINITI JR4 5F (CATHETERS) ×2 IMPLANT
CATH SWAN GANZ 7F STRAIGHT (CATHETERS) ×2 IMPLANT
GLIDESHEATH SLEND SS 6F .021 (SHEATH) ×2 IMPLANT
KIT HEART LEFT (KITS) ×3 IMPLANT
PACK CARDIAC CATHETERIZATION (CUSTOM PROCEDURE TRAY) ×3 IMPLANT
SHEATH PINNACLE 7F 10CM (SHEATH) ×2 IMPLANT
TRANSDUCER W/STOPCOCK (MISCELLANEOUS) ×4 IMPLANT
TUBING CIL FLEX 10 FLL-RA (TUBING) ×3 IMPLANT
WIRE SAFE-T 1.5MM-J .035X260CM (WIRE) ×3 IMPLANT

## 2015-08-10 NOTE — H&P (View-Only) (Signed)
SUBJECTIVE:  Had CP this am while up ambulating in the room  OBJECTIVE:   Vitals:   Filed Vitals:   08/08/15 1615 08/08/15 2048 08/09/15 0004 08/09/15 0342  BP: 127/8 142/72 145/66 142/61  Pulse: 94 85 93 94  Temp: 98.8 F (37.1 C) 99.9 F (37.7 C) 98.9 F (37.2 C) 99 F (37.2 C)  TempSrc: Oral Oral Oral Oral  Resp:   16 14  Height:      Weight:   222 lb 0.1 oz (100.7 kg)   SpO2: 98% 98% 96% 97%   I&O's:   Intake/Output Summary (Last 24 hours) at 08/09/15 1020 Last data filed at 08/09/15 0913  Gross per 24 hour  Intake   1560 ml  Output    200 ml  Net   1360 ml   TELEMETRY: Reviewed telemetry pt in NSR:     PHYSICAL EXAM General: Well developed, well nourished, in no acute distress Head: Eyes PERRLA, No xanthomas.   Normal cephalic and atramatic  Lungs:   Clear bilaterally to auscultation and percussion. Heart:   HRRR S1 S2 Pulses are 2+ & equal. Abdomen: Bowel sounds are positive, abdomen soft and non-tender without masses Extremities:   No clubbing, cyanosis or edema.  DP +1 Neuro: Alert and oriented X 3. Psych:  Good affect, responds appropriately   LABS: Basic Metabolic Panel:  Recent Labs  08/08/15 0528 08/09/15 0652  NA 142 139  K 4.0 3.9  CL 108 105  CO2 26 25  GLUCOSE 88 101*  BUN 13 10  CREATININE 0.69 0.70  CALCIUM 8.9 9.0   Liver Function Tests: No results for input(s): AST, ALT, ALKPHOS, BILITOT, PROT, ALBUMIN in the last 72 hours. No results for input(s): LIPASE, AMYLASE in the last 72 hours. CBC:  Recent Labs  08/08/15 0528 08/09/15 0652  WBC 7.2 8.1  HGB 8.4* 9.8*  HCT 27.2* 30.6*  MCV 85.0 83.2  PLT 326 370   Cardiac Enzymes:  Recent Labs  08/06/15 1817 08/06/15 2211  TROPONINI <0.03 <0.03   BNP: Invalid input(s): POCBNP D-Dimer:  Recent Labs  08/06/15 1131  DDIMER 4.42*   Hemoglobin A1C:  Recent Labs  08/06/15 1817  HGBA1C 6.5*   Fasting Lipid Panel:  Recent Labs  08/07/15 0450  CHOL 122  HDL  39*  LDLCALC 73  TRIG 52  CHOLHDL 3.1   Thyroid Function Tests: No results for input(s): TSH, T4TOTAL, T3FREE, THYROIDAB in the last 72 hours.  Invalid input(s): FREET3 Anemia Panel:  Recent Labs  08/06/15 1817  FERRITIN 174  TIBC 342  IRON 12*   Coag Panel:   Lab Results  Component Value Date   INR 0.94 10/08/2013    RADIOLOGY: Dg Chest 2 View  08/06/2015  CLINICAL DATA:  Left breast and left arm pain. Left jaw pain with dizziness and nausea for 1 week. EXAM: CHEST  2 VIEW COMPARISON:  10/19/2013.  CT chest 10/18/2013. FINDINGS: Trachea is midline. Heart size stable. There may be mild interstitial prominence and indistinctness. No pleural fluid. IMPRESSION: Question mild pulmonary edema. Electronically Signed   By: Lorin Picket M.D.   On: 08/06/2015 12:33   Ct Angio Chest Pe W/cm &/or Wo Cm  08/06/2015  CLINICAL DATA:  Acute onset of left-sided chest pain and elevated D-dimer. Dyspnea. Initial encounter. EXAM: CT ANGIOGRAPHY CHEST WITH CONTRAST TECHNIQUE: Multidetector CT imaging of the chest was performed using the standard protocol during bolus administration of intravenous contrast. Multiplanar CT image reconstructions and  MIPs were obtained to evaluate the vascular anatomy. CONTRAST:  90 mL of Isovue 370 IV contrast COMPARISON:  Chest radiograph performed earlier today at 11:50 a.m., and CTA of the chest performed 10/18/2013 FINDINGS: There is no evidence of pulmonary embolus. Mild peripheral opacity is noted at the left upper lobe, raising concern for mild pneumonia. The appearance is less typical for pulmonary infarct. No pleural effusion or pneumothorax is seen. No masses are identified; no abnormal focal contrast enhancement is seen. A small pericardial effusion is noted. Would correlate for any evidence of pericarditis. The heart is mildly enlarged. A mildly enlarged 1.2 cm periaortic node is noted. No additional mediastinal lymphadenopathy is seen. The great vessels are  grossly unremarkable in appearance. No axillary lymphadenopathy is seen. The visualized portions of the thyroid gland are unremarkable in appearance. An apparent postoperative seroma is noted at the medial aspect of the left breast, measuring approximately 4.7 x 2.5 cm, with adjacent postoperative change. Hypodensities within the liver are only minimally changed in size from 2015 and likely reflect cysts, measuring up to 2.6 cm in size. The visualized portions of the spleen are unremarkable. A small hiatal hernia is noted. The visualized portions of the pancreas and adrenal glands are within normal limits. The patient is status post cholecystectomy, with clips noted at the gallbladder fossa. No acute osseous abnormalities are seen. Review of the MIP images confirms the above findings. IMPRESSION: 1. No evidence of pulmonary embolus. 2. Mild peripheral opacity at the left upper lung lobe, concerning for mild pneumonia. 3. Small pericardial effusion noted. Would correlate for any clinical evidence of pericarditis. 4. Mild cardiomegaly. 5. Mildly enlarged 1.2 cm periaortic node noted. 6. Apparent postoperative seroma at the medial aspect of the left breast, measuring 4.7 x 2.5 cm, with adjacent postoperative change. 7. Scattered hepatic cysts are mildly increased in size. 8. Small hiatal hernia seen. Electronically Signed   By: Garald Balding M.D.   On: 08/06/2015 19:18    ASSESSMENT/PLAN:  The patient is a 70yo female with a history of LBBB as far back as 2007. It does not appear we have seen her in the clinic since 2008. She had ischemic evaluation at that time which was negative with normal LVEF. Her history also includes HLD, HTN, breast cancer(2015). She presents with chest pain, which radiates around the left side into her left arm and and jaw, along with severe fatigue/weakness. She has a lot stairs at home and a week ago she started having the pain, worse going up the stairs, and she was exhausted  after going up. She thought the pani was indigestion. Her mother died of a massive MI at age 44. She has also noticed her heart beating fast.   Atrial fibrillation ([place]) - newonset PAF Currently maintaining sinus rhythm. CHADSVASC = 4, if she does indeed have CAD. Stool hemoccult negative for blood.   No CP at the moment.  TSH WNL one month ago This patients CHA2DS2-VASc Score and unadjusted Ischemic Stroke Rate (% per year) is equal to 4.8 % stroke rate/year from a score of 4  Chest pain with High Risk for Cardiac Etiology Troponin is negative 2, however, the patient's symptoms are extremely concerning for angina. No PE by chest CT but does appear to have opacity in LUL ? PNA. She also has new onset paroxysmal atrial fibrillation. Currently in sinus rhythm with a known left bundle branch block. This is in the setting of anemia. I think this is more than just  demand ischemia given the severity of her symptoms especially in light of echo showing severe LV dysfunction with EF 25-30%. Dr. Ellyn Hack has set her up for left heart catheterization on Monday, especially since her mother died of a massive MI at age 25 and she has risk facotrs for it. She does have well-controlled lipids on 40mg  lipitor. Continue BB/statin. ASA on hold due to anemia but since she has had more CP and hemoccult is negative, will start ASA 81mg  daily and start IV NTG gtt.  Pericardial effusion, small to moderate by echo with no tamponade  Identified on CT and echo  Dyspnea - multifactorial. Potentially related to anemia as well as heart failure symptoms with possible ischemia. 2D echo with severe LV dysfunction and severe global HK. Continue BB/diuretic. Hold ACE I until after cath.   Chronic anemia - she has a history of PUD -Hgb on 03/31/15: 10.1, 07/13/15: 9.2, yesterday 8.4 and today  9.8. Rectal was heme negative. Occult blood stool negative. Iron is low- 12. Colonoscopy in 2014 with diverticulosis.  Recommend GI consult over weekend prior to cath.  Hyperlipidemia On statin  Breast CA s/p XRT 2 years ago   Sueanne Margarita, MD  08/09/2015  10:20 AM

## 2015-08-10 NOTE — Interval H&P Note (Signed)
History and Physical Interval Note:  08/10/2015 2:10 PM  [name] [name]  has presented today for cardiac cath with the diagnosis of cp  The various methods of treatment have been discussed with the patient and family. After consideration of risks, benefits and other options for treatment, the patient has consented to  Procedure(s): Right/Left Heart Cath and Coronary Angiography (N/A) as a surgical intervention .  The patient's history has been reviewed, patient examined, no change in status, stable for surgery.  I have reviewed the patient's chart and labs.  Questions were answered to the patient's satisfaction.     [name]

## 2015-08-10 NOTE — Progress Notes (Addendum)
Patient ID: [name] [name], female   DOB: 1945/07/05, 70 y.o.   MRN: [id number]   PROGRESS NOTE    [name] [name]  [id number] DOB: [birth year] DOA: 08/06/2015  PCP: Alesia Richards, MD   Outpatient Specialists:   Brief Narrative:  70 yo female with a history of LBBB, HLD, HTN, breast cancer (2015), presented with main concern of one week duration of chest pain that has been occasionally but not consistently radiating to the left jaw and left shoulder. Cardiology consulted.    Assessment & Plan:  Chest Pain with dyspnea on exertion / Acute systolic CHF - two sets of troponins negative but in the setting of atrial fibrillation, certainly worrisome for angina - CT chest also worrisome for LUL PNA, given pt's report of productive cough and low grade fevers, cont on empiric Levaquin day #4/5 - ECHO showing severe LV dysfunction and EF 25 - 30 % - plan for LHC today - cardiology team following, appreciate assistance  - aspirin started   Atrial fibrillation, CHADS2 VASC score 4 ([name]) - last TSH one month ago was WNL - per cardiology   Pericardial effusion, small to moderate by echo with no tamponade - Identified on CT  Breast CA s/p XRT 2 years ago  Anemia of chronic disease, IDA, perhaps related to hx of breast cancer  - transfused one U PRBC and Hg up from 7.4 --> 8.4 --> 9.8 --> 10 - iron level low at 12, placed on supplementation  - FOBT negative - spoke with GI on call, pt had last colonoscopy in 2014 with diverticulosis, otherwise unremarkable exam, has chronic anemia with baseline Hg 7 -9 in the past 2 years - recommendation is to continue PPI and Iron supplement with no indication for colonoscopy or EGD  - no indication for transfusion at this time - CBC In AM  Hypothyroidism - continue synthroid   Hyperlipidemia - continue statin   Obesity  - Body mass index is 37.38 kg/(m^2).  DVT prophylaxis: Lovenox SQ Code Status: Full Family  Communication: Patient at bedside  Disposition Plan: Home when cleared by cardiology team, plan for [facility] today, discharge plan based on LHC results   Consultants:   Cardiology   GI over the phone   Procedures:   None  Antimicrobials:  [place] 4/28 -->  It is my opinion that pt will require at least 3-4 days of hospital stay. She has severe LV  Dysfunction adn EF 25 - 30% now ? New LUL PNA, pericardial effusion. She has also developed atrial fib with RVR. Hg was low on admission requiring blood transfusion.Her multiple complex medical conditions require multidisciplinary team involvement including cardiology, GI, internal medicine. Pt is unsafe to be discharged at this time and currently we are unable to determine appropriate length of stay due to complexity of medical issues. She needs LHC which is planned for Monday but we are first awaiting for GI input. Please call me with questions. Dr. Doyle Askew [phone]   Subjective: Still with intermittent CP but better.   Objective: Filed Vitals:   08/10/15 0046 08/10/15 0500 08/10/15 0745 08/10/15 0751  BP: 124/53 125/62 131/56 131/56  Pulse: 86 88 90 90  Temp: 98.3 F (36.8 C) 99.2 F (37.3 C)  98.4 F (36.9 C)  TempSrc: Oral Oral  Oral  Resp:      Height:      Weight:  99.882 kg (220 lb 3.2 oz)    SpO2: 96% 97%  96%    Intake/Output  Summary (Last 24 hours) at 08/10/15 1126 Last data filed at 08/09/15 1927  Gross per 24 hour  Intake    540 ml  Output      0 ml  Net    540 ml   Filed Weights   08/08/15 0320 08/09/15 0004 08/10/15 0500  Weight: 102.422 kg (225 lb 12.8 oz) 100.7 kg (222 lb 0.1 oz) 99.882 kg (220 lb 3.2 oz)    Examination:  General exam: Appears calm and comfortable  Respiratory system: Clear to auscultation. Respiratory effort normal. Diminished breath sounds at bases  Cardiovascular system: S1 & S2 heard, RRR. SEM 1/6 Gastrointestinal system: Abdomen is nondistended, soft and nontender.   Data  Reviewed: I have personally reviewed following labs and imaging studies  CBC:  Recent Labs Lab 08/06/15 1131 08/07/15 0450 08/07/15 2043 08/08/15 0528 08/09/15 0652 08/10/15 0506  WBC 8.3 6.8  --  7.2 8.1 7.5  HGB 8.2* 7.4* 8.6* 8.4* 9.8* 10.0*  HCT 25.6* 23.7* 26.7* 27.2* 30.6* 31.1*  MCV 82.6 83.2  --  85.0 83.2 83.8  PLT 332 307  --  326 370 [id number]   Basic Metabolic Panel:  Recent Labs Lab 08/06/15 1131 08/07/15 0450 08/08/15 0528 08/09/15 0652 08/10/15 0506  NA 138 140 142 139 138  K 3.6 3.8 4.0 3.9 3.9  CL 104 107 108 105 103  CO2 23 25 26 25 25   GLUCOSE 88 101* 88 101* 99  BUN 13 11 13 10 13   CREATININE 0.79 0.57 0.69 0.70 0.67  CALCIUM 9.1 8.7* 8.9 9.0 9.1   Cardiac Enzymes:  Recent Labs Lab 08/06/15 1817 08/06/15 2211  TROPONINI <0.03 <0.03   Urine analysis:    Component Value Date/Time   COLORURINE YELLOW 06/16/2014 1523   APPEARANCEUR CLEAR 06/16/2014 1523   LABSPEC 1.013 06/16/2014 1523   PHURINE 5.0 06/16/2014 1523   GLUCOSEU NEG 06/16/2014 1523   HGBUR NEG 06/16/2014 1523   BILIRUBINUR NEG 06/16/2014 1523   KETONESUR NEG 06/16/2014 1523   PROTEINUR NEG 06/16/2014 1523   UROBILINOGEN 0.2 06/16/2014 1523   NITRITE NEG 06/16/2014 1523   LEUKOCYTESUR NEG 06/16/2014 1523   Radiology Studies: Dg Chest 2 View 08/06/2015 Question mild pulmonary edema.   Ct Angio Chest Pe W/cm &/or Wo Cm 08/06/2015  No evidence of pulmonary embolus. 2. Mild peripheral opacity at the left upper lung lobe, concerning for mild pneumonia. 3. Small pericardial effusion noted. Would correlate for any clinical evidence of pericarditis. 4. Mild cardiomegaly. 5. Mildly enlarged 1.2 cm periaortic node noted. 6. Apparent postoperative seroma at the medial aspect of the left breast, measuring 4.7 x 2.5 cm, with adjacent postoperative change. 7. Scattered hepatic cysts are mildly increased in size. 8. Small hiatal hernia seen.   Scheduled Meds: . anastrozole  1 mg Oral Daily  .  aspirin EC  81 mg Oral Daily  . atorvastatin  40 mg Oral q1800  . carvedilol  6.25 mg Oral BID WC  . cyclobenzaprine  10 mg Oral QHS  . DULoxetine  30 mg Oral TID  . enoxaparin (LOVENOX) injection  40 mg Subcutaneous Q24H  . famotidine  20 mg Oral QHS  . ferrous sulfate  325 mg Oral BID WC  . fluticasone  1 spray Each Nare Daily  . furosemide  20 mg Oral BID  . gabapentin  300 mg Oral QHS  . levofloxacin (LEVAQUIN) IV  750 mg Intravenous Q24H  . levothyroxine  50 mcg Oral QAC breakfast  . meloxicam  7.5-15 mg Oral Daily  . pantoprazole  40 mg Oral Daily  . sodium chloride flush  3 mL Intravenous Q12H  . sucralfate  1 g Oral QID   Continuous Infusions:  Time spent: 20 minutes   Faye Ramsay, MD [facility] Pager [phone]  If 7PM-7AM, please contact night-coverage [email or web address] Password [email or web address] 08/10/2015, 11:26 AM

## 2015-08-10 NOTE — Progress Notes (Addendum)
Site area: RFV       Site Prior to Removal:  Level 0 Pressure Applied For:15 min Manual: yes   Patient Status During Pull:  stable Post Pull Site:  Level 0 Post Pull Instructions Given:  yes Post Pull Pulses Present: palpable Dressing Applied:  tegaderm Bedrest begins @ 1500 till 1800 Comments:

## 2015-08-11 ENCOUNTER — Encounter (HOSPITAL_COMMUNITY): Payer: Self-pay | Admitting: Cardiovascular Disease

## 2015-08-11 DIAGNOSIS — I429 Cardiomyopathy, unspecified: Secondary | ICD-10-CM

## 2015-08-11 LAB — BASIC METABOLIC PANEL
Anion gap: 10 (ref 5–15)
BUN: 15 mg/dL (ref 6–20)
CALCIUM: 8.8 mg/dL — AB (ref 8.9–10.3)
CHLORIDE: 102 mmol/L (ref 101–111)
CO2: 24 mmol/L (ref 22–32)
CREATININE: 0.67 mg/dL (ref 0.44–1.00)
GFR calc non Af Amer: >60 mL/min (ref >60)
GLUCOSE: 112 mg/dL — AB (ref 65–99)
Potassium: 3.7 mmol/L (ref 3.5–5.1)
Sodium: 136 mmol/L (ref 135–145)

## 2015-08-11 LAB — CBC
HEMATOCRIT: 30.3 % — AB (ref 36.0–46.0)
HEMOGLOBIN: 9.6 g/dL — AB (ref 12.0–15.0)
MCH: 26.2 pg (ref 26.0–34.0)
MCHC: 31.7 g/dL (ref 30.0–36.0)
MCV: 82.6 fL (ref 78.0–100.0)
Platelets: 393 10*3/uL (ref 150–400)
RBC: 3.67 MIL/uL — AB (ref 3.87–5.11)
RDW: 15 % (ref 11.5–15.5)
WBC: 6.7 10*3/uL (ref 4.0–10.5)

## 2015-08-11 MED ORDER — ATORVASTATIN CALCIUM 80 MG PO TABS: 40.0000 mg | ORAL_TABLET | Freq: Every day | ORAL | Status: DC

## 2015-08-11 MED ORDER — LEVOFLOXACIN 750 MG PO TABS
750.0000 mg | ORAL_TABLET | Freq: Once | ORAL | Status: AC
  Administered 2015-08-11: 750 mg via ORAL
  Filled 2015-08-11: qty 1

## 2015-08-11 MED ORDER — HYDROCODONE-ACETAMINOPHEN 5-325 MG PO TABS: ORAL_TABLET | ORAL | Status: DC

## 2015-08-11 MED ORDER — LEVOFLOXACIN 750 MG PO TABS: 750.0000 mg | ORAL_TABLET | Freq: Once | ORAL | Status: DC

## 2015-08-11 MED ORDER — FUROSEMIDE 20 MG PO TABS: 20.0000 mg | ORAL_TABLET | Freq: Two times a day (BID) | ORAL | Status: DC

## 2015-08-11 MED ORDER — CARVEDILOL 6.25 MG PO TABS: 6.2500 mg | ORAL_TABLET | Freq: Two times a day (BID) | ORAL | Status: DC

## 2015-08-11 MED ORDER — FERROUS SULFATE 325 (65 FE) MG PO TABS: 325.0000 mg | ORAL_TABLET | Freq: Two times a day (BID) | ORAL | Status: DC

## 2015-08-11 MED ORDER — ASPIRIN 81 MG PO TBEC: 81.0000 mg | DELAYED_RELEASE_TABLET | Freq: Every day | ORAL | Status: DC

## 2015-08-11 MED ORDER — MELOXICAM 7.5 MG PO TABS
7.5000 mg | ORAL_TABLET | Freq: Every day | ORAL | Status: DC
  Administered 2015-08-11: 7.5 mg via ORAL
  Filled 2015-08-11: qty 1

## 2015-08-11 MED FILL — Verapamil HCl IV Soln 2.5 MG/ML: INTRAVENOUS | Qty: 2 | Status: AC

## 2015-08-11 NOTE — Care Management Important Message (Signed)
Important Message  Patient Details  Name: [name] [name] MRN: [id number] Date of Birth: Dec 28, 1945   Medicare Important Message Given:  Yes    Nathen May 08/11/2015, 12:25 PM

## 2015-08-11 NOTE — Discharge Instructions (Signed)

## 2015-08-11 NOTE — Discharge Summary (Signed)
Physician Discharge Summary  LURENA SLUYTER [id number] DOB: 1946/04/09 DOA: 08/06/2015  PCP: Alesia Richards, MD  Admit date: 08/06/2015 Discharge date: 08/11/2015  Recommendations for Outpatient Follow-up:  1. Pt will need to follow up with PCP in 1-2 weeks post discharge 2. Please obtain BMP to evaluate electrolytes and kidney function 3. Please also check CBC to evaluate Hg and Hct levels 4. Pt started on aspirin  5. Please note that cardiology recommended stopping Verapamil   Discharge Diagnoses:  Active Problems:   Hyperlipidemia   Essential hypertension   H/O left bundle branch block  Discharge Condition: Stable  Diet recommendation: Heart healthy diet discussed in details   Brief Narrative:  70 yo female with a history of LBBB, HLD, HTN, breast cancer (2015), presented with main concern of one week duration of chest pain that has been occasionally but not consistently radiating to the left jaw and left shoulder. Cardiology consulted.   Assessment & Plan:  Chest Pain with dyspnea on exertion / Acute systolic CHF - two sets of troponins negative but in the setting of atrial fibrillation, certainly worrisome for angina - CT chest also worrisome for LUL PNA, given pt's report of productive cough and low grade fevers, pt advised to complete therapy with Levaquin  - ECHO showing severe LV dysfunction and EF 25 - 30 % - LHC with no evidence of CAD - continue BB and statin, aspirin   Atrial fibrillation, CHADS2 VASC score 4 ([place]) - last TSH one month ago was WNL - per cardiology, no need for [facility] at this time but recommends Aspirin which was started   Pericardial effusion, small to moderate by echo with no tamponade - Identified on CT  Breast CA s/p XRT 2 years ago  Anemia of chronic disease, IDA, perhaps related to hx of breast cancer  - transfused one U PRBC and Hg up from 7.4 --> 8.4 --> 9.8 --> 10 - iron level low at 12, placed on supplementation  - FOBT  negative - spoke with GI on call, pt had last colonoscopy in 2014 with diverticulosis, otherwise unremarkable exam, has chronic anemia with baseline Hg 7 -9 in the past 2 years - recommendation is to continue PPI and Iron supplement with no indication for colonoscopy or EGD  - no indication for transfusion at this time  Hypothyroidism - continue synthroid   Hyperlipidemia - continue statin   Obesity  - Body mass index is 37.38 kg/(m^2).  Code Status: Full Family Communication: Patient at bedside  Disposition Plan: Home   Consultants:   Cardiology   GI over the phone  Procedures:   None  Antimicrobials:  Levaquin 4/28 -->  Discharge Exam: Filed Vitals:   08/11/15 0510 08/11/15 0800  BP: 150/66 135/74  Pulse: 92 97  Temp: 99 F (37.2 C) 99.6 F (37.6 C)  Resp: 16 16   Filed Vitals:   08/10/15 1800 08/10/15 2100 08/11/15 0510 08/11/15 0800  BP: 142/79 126/40 150/66 135/74  Pulse: 93 87 92 97  Temp:  99.1 F (37.3 C) 99 F (37.2 C) 99.6 F (37.6 C)  TempSrc:  Oral Oral Oral  Resp:  16 16 16   Height:      Weight:   100.7 kg (222 lb 0.1 oz)   SpO2: 98% 97% 95% 97%    General: Pt is alert, follows commands appropriately, not in acute distress Cardiovascular: Regular rate and rhythm, S1/S2 +, no murmurs, no rubs, no gallops Respiratory: Clear to auscultation bilaterally, no wheezing,  no crackles, no rhonchi Abdominal: Soft, non tender, non distended, bowel sounds +, no guarding  Discharge Instructions     Medication List    STOP taking these medications        doxazosin 8 MG tablet  Commonly known as:  CARDURA     verapamil 240 MG CR tablet  Commonly known as:  CALAN-SR      TAKE these medications        anastrozole 1 MG tablet  Commonly known as:  ARIMIDEX  Take 1 tablet (1 mg total) by mouth daily.     aspirin 81 MG EC tablet  Take 1 tablet (81 mg total) by mouth daily.     atorvastatin 80 MG tablet  Commonly known as:   LIPITOR  Take 0.5 tablets (40 mg total) by mouth daily.     Calcium Carbonate-Vitamin D 600-400 MG-UNIT chew tablet  Commonly known as:  CALTRATE 600+D  Chew 1 tablet by mouth daily.     carvedilol 6.25 MG tablet  Commonly known as:  COREG  Take 1 tablet (6.25 mg total) by mouth 2 (two) times daily with a meal.     cyclobenzaprine 10 MG tablet  Commonly known as:  FLEXERIL  Take 10 mg by mouth at bedtime.     DULoxetine 30 MG capsule  Commonly known as:  CYMBALTA  Take 1 capsule (30 mg total) by mouth 3 (three) times daily.     ferrous sulfate 325 (65 FE) MG tablet  Take 1 tablet (325 mg total) by mouth 2 (two) times daily with a meal.     furosemide 20 MG tablet  Commonly known as:  LASIX  Take 1 tablet (20 mg total) by mouth 2 (two) times daily.     gabapentin 300 MG capsule  Commonly known as:  NEURONTIN  Take 1 capsule (300 mg total) by mouth at bedtime.     HYDROcodone-acetaminophen 5-325 MG tablet  Commonly known as:  NORCO/VICODIN  TAKE 1 TO 2 TABLETS BY MOUTH EVERY 6 TO 8 HOURS AS NEEDED FOR PAIN     levofloxacin 750 MG tablet  Commonly known as:  LEVAQUIN  Take 1 tablet (750 mg total) by mouth once.     levothyroxine 50 MCG tablet  Commonly known as:  SYNTHROID, LEVOTHROID  TAKE 1/2 OR 1 TABLET BY MOUTH EVERY MORNING 30-60 MINS BEFORE BREAKFAST ON AN EMPTY STOMACH     meloxicam 15 MG tablet  Commonly known as:  MOBIC  TAKE 1/2 TO 1 TABLET DAILY WITH FOOR FOR PAIN *DONT TAKE WITH ALEVE OR IBUPROFEN CAN TAKE TYLENOL*     mometasone 50 MCG/ACT nasal spray  Commonly known as:  NASONEX  USE 1 TO 2 SPRAYS IN EACH NOSTRIL TWICE DAILY     ranitidine 300 MG tablet  Commonly known as:  ZANTAC  TAKE 1 TABLET BY MOUTH TWICE A DAY WHILE TRYING TO GET OFF PPI THEN CAN GO TO ONCE NIGHTLY     sucralfate 1 g tablet  Commonly known as:  CARAFATE  TAKE 1 TABLET BY MOUTH 4 TIMES A DAY     temazepam 30 MG capsule  Commonly known as:  RESTORIL  Take 1 capsule at hour of  sleep as needed     traMADol 50 MG tablet  Commonly known as:  ULTRAM  TAKE 1 TABLET BY MOUTH 4 TIMES DAILY AS NEEDED     Vitamin D 2000 units tablet  Take 4,000 Units by mouth daily.  Follow-up Information    Follow up with Alesia Richards, MD.   Specialty:  Internal Medicine   Contact information:   [address] [place] [address] [phone]       Call Faye Ramsay, MD.   Specialty:  Internal Medicine   Why:  As needed call my cell phone [phone]   Contact information:   [address] [place] [address] [phone]        The results of significant diagnostics from this hospitalization (including imaging, microbiology, ancillary and laboratory) are listed below for reference.     Microbiology: Recent Results (from the past 240 hour(s))  Culture, blood (routine x 2) Call MD if unable to obtain prior to antibiotics being given     Status: None (Preliminary result)   Collection Time: 08/07/15 12:40 PM  Result Value Ref Range Status   Specimen Description BLOOD RIGHT ARM  Final   Special Requests BOTTLES DRAWN AEROBIC AND ANAEROBIC 5CC  Final   Culture NO GROWTH 4 DAYS  Final   Report Status PENDING  Incomplete  Culture, blood (routine x 2) Call MD if unable to obtain prior to antibiotics being given     Status: None (Preliminary result)   Collection Time: 08/07/15 12:45 PM  Result Value Ref Range Status   Specimen Description BLOOD RIGHT HAND  Final   Special Requests BOTTLES DRAWN AEROBIC ONLY 3CC  Final   Culture NO GROWTH 4 DAYS  Final   Report Status PENDING  Incomplete     Labs: Basic Metabolic Panel:  Recent Labs Lab 08/07/15 0450 08/08/15 0528 08/09/15 0652 08/10/15 0506 08/11/15 0518  NA 140 142 139 138 136  K 3.8 4.0 3.9 3.9 3.7  CL 107 108 105 103 102  CO2 25 26 25 25 24   GLUCOSE 101* 88 101* 99 112*  BUN 11 13 10 13 15   CREATININE 0.57 0.69 0.70 0.67 0.67  CALCIUM  8.7* 8.9 9.0 9.1 8.8*   CBC:  Recent Labs Lab 08/07/15 0450 08/07/15 2043 08/08/15 0528 08/09/15 0652 08/10/15 0506 08/11/15 0518  WBC 6.8  --  7.2 8.1 7.5 6.7  HGB 7.4* 8.6* 8.4* 9.8* 10.0* 9.6*  HCT 23.7* 26.7* 27.2* 30.6* 31.1* 30.3*  MCV 83.2  --  85.0 83.2 83.8 82.6  PLT 307  --  326 370 378 393   Cardiac Enzymes:  Recent Labs Lab 08/06/15 1817 08/06/15 2211  TROPONINI <0.03 <0.03    SIGNED: Time coordinating discharge: 30 minutes  Faye Ramsay, MD  [facility] 08/11/2015, 10:58 AM Pager [phone]  If 7PM-7AM, please contact night-coverage [email or web address] Password [email or web address]

## 2015-08-11 NOTE — Progress Notes (Signed)
Patient Name: [name] [name] Date of Encounter: 08/11/2015  Hospital Problem List     Active Problems:   Hyperlipidemia   Essential hypertension   H/O left bundle branch block   GERD   Obesity (BMI 30-39.9)   Paroxysmal supraventricular tachycardia (HCC)   Chest pain with high risk for cardiac etiology - in setting of anemai   Dyspnea   Hypothyroidism   History of ulcer disease   Chronic anemia   Absolute anemia   Systolic CHF, acute (HCC)   Congestive dilated cardiomyopathy ([place])    Subjective   Reports feeling well post cath. States she has had 2 episodes of fleeting chest pressure this am while sitting in the chair.  Inpatient Medications    . anastrozole  1 mg Oral Daily  . aspirin EC  81 mg Oral Daily  . atorvastatin  40 mg Oral q1800  . carvedilol  6.25 mg Oral BID WC  . cyclobenzaprine  10 mg Oral QHS  . DULoxetine  30 mg Oral TID  . famotidine  20 mg Oral QHS  . ferrous sulfate  325 mg Oral BID WC  . fluticasone  1 spray Each Nare Daily  . furosemide  20 mg Oral BID  . gabapentin  300 mg Oral QHS  . levofloxacin  750 mg Oral Once  . levothyroxine  50 mcg Oral QAC breakfast  . meloxicam  7.5 mg Oral Daily  . off the beat book   Does not apply Once  . pantoprazole  40 mg Oral Daily  . sodium chloride flush  3 mL Intravenous Q12H  . sucralfate  1 g Oral QID    Vital Signs    Filed Vitals:   08/10/15 1800 08/10/15 2100 08/11/15 0510 08/11/15 0800  BP: 142/79 126/40 150/66 135/74  Pulse: 93 87 92 97  Temp:  99.1 F (37.3 C) 99 F (37.2 C) 99.6 F (37.6 C)  TempSrc:  Oral Oral Oral  Resp:  16 16 16   Height:      Weight:   222 lb 0.1 oz (100.7 kg)   SpO2: 98% 97% 95% 97%    Intake/Output Summary (Last 24 hours) at 08/11/15 1125 Last data filed at 08/11/15 1043  Gross per 24 hour  Intake    923 ml  Output      0 ml  Net    923 ml   Filed Weights   08/09/15 0004 08/10/15 0500 08/11/15 0510  Weight: 222 lb 0.1 oz (100.7 kg) 220 lb 3.2 oz  (99.882 kg) 222 lb 0.1 oz (100.7 kg)    Physical Exam    General: Pleasant, NAD. Neuro: Alert and oriented X 3. Moves all extremities spontaneously. Psych: Normal affect. HEENT:  Normal  Neck: Supple without bruits or JVD. Lungs:  Resp regular and unlabored, CTA. Heart: RRR no s3, s4, or murmurs. Abdomen: Soft, non-tender, non-distended, BS + x 4.  Extremities: No clubbing, cyanosis or edema. DP/PT/Radials 2+ and equal bilaterally. Right wrist and groin without hematoma. No bruit noted to right groin.  Labs    CBC  Recent Labs  08/10/15 0506 08/11/15 0518  WBC 7.5 6.7  HGB 10.0* 9.6*  HCT 31.1* 30.3*  MCV 83.8 82.6  PLT 378 [id number]   Basic Metabolic Panel  Recent Labs  08/10/15 0506 08/11/15 0518  NA 138 136  K 3.9 3.7  CL 103 102  CO2 25 24  GLUCOSE 99 112*  BUN 13 15  CREATININE 0.67 0.67  CALCIUM 9.1 8.8*    Telemetry    SR Rate-80  ECG    No recent EKG  Radiology     Assessment & Plan    The patient is a 70yo female with a history of LBBB as far back as 2007. It does not appear we have seen her in the clinic since 2008. She had ischemic evaluation at that time which was negative with normal LVEF. Her history also includes HLD, HTN, breast cancer(2015).She presents with chest pain, which radiated around the left side into her left arm and and jaw, along with severe fatigue/weakness. She has a lot stairs at home and a week ago she started having the pain, worse going up the stairs, and she was exhausted after going up. She thought the pain was indigestion. Her mother died of a massive MI at age 36.She has also noticed her heart beating fast.  1.Atrial fibrillation ([place]) - newonset PAF Currently maintaining sinus rhythm. CHADSVASC = 4, if she does indeed have CAD. Stool hemoccult negative for blood.   TSH WNL one month ago This patients CHA2DS2-VASc Score and unadjusted Ischemic Stroke Rate (% per year) is equal to 4.8 % stroke rate/year from  a score of 4  2.Chest pain with High Risk for Cardiac Etiology Troponin is negative 2, however, the patient's symptoms are extremely concerning for angina. No PE by chest CT but does appear to have opacity in LUL ? PNA. She also has new onset paroxysmal atrial fibrillation. Currently in sinus rhythm with a known left bundle branch block. This is in the setting of anemia. -Underwent a L/RHC yesterday with Dr.McAlhany and noted to have no evidence of CAD and normal filling pressures.Reccomendations to continue with medical management.  -She does have well-controlled lipids on 40mg  lipitor. -Continue BB/statin, ASA  -Does report 2 fleeting episodes of chest pressure while sitting up in the chair, non-reproducible to palpation.  3.Pericardial effusion, small to moderate by echo with no tamponade  Identified on CT and echo  4.Dyspnea - multifactorial. Potentially related to anemia as well as heart failure symptoms with possible ischemia. 2D        echo with severe LV dysfunction and severe global HK.    -Continue BB/diuretic.    -would start on low dose ACE     -Was on home verapamil, but I would not restart this given her LV function and PAF  5.Chronic anemia - she has a history of PUD -Hgb on 03/31/15: 10.1, 07/13/15: 9.2, 08/09/15 9.8. Rectal    was heme negative.Occult blood stool negative. Iron is low- 12. Colonoscopy in 2014 with diverticulosis.   -GI was consulted with recommendation to continue PPI and Iron supplement   -Hgb 9.6 this morning, at stable baseline post cath  6.Hyperlipidemia -Continue on statin  7.Breast CA s/p XRT 2 years ago  Will make f/u appt.   Signed, Reino Bellis NP-C Pager [phone]   Agree with assessment and plan. Cath findings reviewed. Normal coronary arteries; normal right heart pressure and findings c/w nonischemic cardiomyopathy. LVEDP 10 mm Hg. OK from cardiac standpoint for dc today.   Troy Sine, MD,  [facility] 08/11/2015 4:54 PM

## 2015-08-12 LAB — CULTURE, BLOOD (ROUTINE X 2)
CULTURE: NO GROWTH
CULTURE: NO GROWTH

## 2015-08-19 ENCOUNTER — Ambulatory Visit (INDEPENDENT_AMBULATORY_CARE_PROVIDER_SITE_OTHER): Payer: Medicare Other | Admitting: Internal Medicine

## 2015-08-19 ENCOUNTER — Encounter: Payer: Self-pay | Admitting: Internal Medicine

## 2015-08-19 VITALS — BP 150/90 | HR 96 | Temp 98.1°F | Resp 14 | Ht 65.0 in | Wt 214.6 lb

## 2015-08-19 DIAGNOSIS — D509 Iron deficiency anemia, unspecified: Secondary | ICD-10-CM | POA: Diagnosis not present

## 2015-08-19 DIAGNOSIS — I509 Heart failure, unspecified: Secondary | ICD-10-CM | POA: Diagnosis not present

## 2015-08-19 DIAGNOSIS — F341 Dysthymic disorder: Secondary | ICD-10-CM

## 2015-08-19 DIAGNOSIS — R079 Chest pain, unspecified: Secondary | ICD-10-CM | POA: Diagnosis not present

## 2015-08-19 DIAGNOSIS — E538 Deficiency of other specified B group vitamins: Secondary | ICD-10-CM | POA: Diagnosis not present

## 2015-08-19 LAB — CBC WITH DIFFERENTIAL/PLATELET
Basophils Absolute: 73 cells/uL (ref 0–200)
Basophils Relative: 1 %
EOS PCT: 2 %
Eosinophils Absolute: 146 cells/uL (ref 15–500)
HEMATOCRIT: 31.5 % — AB (ref 35.0–45.0)
HEMOGLOBIN: 10.1 g/dL — AB (ref 11.7–15.5)
LYMPHS ABS: 1241 {cells}/uL (ref 850–3900)
Lymphocytes Relative: 17 %
MCH: 26 pg — ABNORMAL LOW (ref 27.0–33.0)
MCHC: 32.1 g/dL (ref 32.0–36.0)
MCV: 81.2 fL (ref 80.0–100.0)
MONO ABS: 803 {cells}/uL (ref 200–950)
MPV: 10.3 fL (ref 7.5–12.5)
Monocytes Relative: 11 %
NEUTROS ABS: 5037 {cells}/uL (ref 1500–7800)
Neutrophils Relative %: 69 %
Platelets: 549 10*3/uL — ABNORMAL HIGH (ref 140–400)
RBC: 3.88 MIL/uL (ref 3.80–5.10)
RDW: 14.9 % (ref 11.0–15.0)
WBC: 7.3 10*3/uL (ref 3.8–10.8)

## 2015-08-19 LAB — BASIC METABOLIC PANEL WITH GFR
BUN: 12 mg/dL (ref 7–25)
CHLORIDE: 100 mmol/L (ref 98–110)
CO2: 28 mmol/L (ref 20–31)
Calcium: 8.9 mg/dL (ref 8.6–10.4)
Creat: 0.66 mg/dL (ref 0.50–0.99)
GFR, Est African American: >89 mL/min (ref >=60)
Glucose, Bld: 95 mg/dL (ref 65–99)
POTASSIUM: 4.1 mmol/L (ref 3.5–5.3)
SODIUM: 140 mmol/L (ref 135–146)

## 2015-08-19 LAB — VITAMIN B12: VITAMIN B 12: 418 pg/mL (ref 200–1100)

## 2015-08-19 LAB — IRON AND TIBC
%SAT: 10 % — AB (ref 11–50)
Iron: 30 ug/dL — ABNORMAL LOW (ref 45–160)
TIBC: 296 ug/dL (ref 250–450)
UIBC: 266 ug/dL (ref 125–400)

## 2015-08-19 MED ORDER — HYOSCYAMINE SULFATE 0.125 MG SL SUBL: 0.1250 mg | SUBLINGUAL_TABLET | SUBLINGUAL | Status: DC | PRN

## 2015-08-19 MED ORDER — VALSARTAN 40 MG PO TABS: 40.0000 mg | ORAL_TABLET | Freq: Every day | ORAL | Status: DC

## 2015-08-19 MED ORDER — BUPROPION HCL ER (XL) 150 MG PO TB24: 150.0000 mg | ORAL_TABLET | ORAL | Status: DC

## 2015-08-19 NOTE — Patient Instructions (Addendum)
Please start taking valsartan once daily in the mornings.  Please check your blood pressure at home.  The ideal range is 120-140/60-85.  Please take levsin as needed for diarrhea and stomach cramping.  Please take wellbutrin once daily.  You can take it in the morning or the evenings to help with depression.    Please continue to cymbalta as this is helping with your depression too.  Please contact work so that I can fill out FMLA papers for you.  Please make sure you keep taking your iron twice daily.

## 2015-08-19 NOTE — Progress Notes (Signed)
Assessment and Plan: [phone] (if 7 days high complexity)  hospital visit follow up for congestive heart failure, iron deficiency anemia, chest pain due to iron def anemia, and severe depression:    1. Anemia, iron deficiency -cont iron and b12 supplement while on PPI -GI declines doing endoscopy/colonoscopy currently -increase leafy greens and sparingly animal meats - CBC with Differential/Platelet - BASIC METABOLIC PANEL WITH GFR - Iron and TIBC - Vitamin B12 - Ambulatory referral to Physical Therapy  2. Chronic congestive heart failure, unspecified congestive heart failure type [facility])  - Ambulatory referral to Physical Therapy  3. Chest pain with high risk for cardiac etiology - in setting of anemai -resolved -likely also secondary to questionable LUL PNA -abx finished -can get repeat CXR at next visit  4. ANXIETY DEPRESSION -wellbutrin -cont cymbalta -recheck in 2 weeks    Over 40 minutes of exam, counseling, chart review, and complex, high/moderate level critical decision making was performed this visit.   HPI 70 y.o.female presents for follow up from the hospital. Admit date to the hospital was 08/06/15, patient was discharged from the hospital on 08/11/15 and our office contacted the office the day after discharge to set up a follow up appointment, patient was admitted for: iron deficiency anemia, chest pains, and also possible LUL pneumonia.  She reports that she is still very fatigued and is tired.  She reports that she finished her levaquin for possible pneumonia.  She reports that she is having some diarrhea.  She reports that she is still taking her iron.  She did stop the verapamil on the suggestion of cardiology due to LV function.  She is due to see cardiology on the 24th of this month.  She reports that she hasn't checked her blood pressure at home.  She reports that she doesn't want to eat and she doesn't want to do anything.  She reports that she has felt so bad lately  that she does not want to do anything.  She did go back to work Monday.  She reports that she just couldn't do well there.  She was too worn out.    Images while in the hospital: Dg Chest 2 View  08/06/2015  CLINICAL DATA:  Left breast and left arm pain. Left jaw pain with dizziness and nausea for 1 week. EXAM: CHEST  2 VIEW COMPARISON:  10/19/2013.  CT chest 10/18/2013. FINDINGS: Trachea is midline. Heart size stable. There may be mild interstitial prominence and indistinctness. No pleural fluid. IMPRESSION: Question mild pulmonary edema. Electronically Signed   By: Lorin Picket M.D.   On: 08/06/2015 12:33   Ct Angio Chest Pe W/cm &/or Wo Cm  08/06/2015  CLINICAL DATA:  Acute onset of left-sided chest pain and elevated D-dimer. Dyspnea. Initial encounter. EXAM: CT ANGIOGRAPHY CHEST WITH CONTRAST TECHNIQUE: Multidetector CT imaging of the chest was performed using the standard protocol during bolus administration of intravenous contrast. Multiplanar CT image reconstructions and MIPs were obtained to evaluate the vascular anatomy. CONTRAST:  90 mL of Isovue 370 IV contrast COMPARISON:  Chest radiograph performed earlier today at 11:50 a.m., and CTA of the chest performed 10/18/2013 FINDINGS: There is no evidence of pulmonary embolus. Mild peripheral opacity is noted at the left upper lobe, raising concern for mild pneumonia. The appearance is less typical for pulmonary infarct. No pleural effusion or pneumothorax is seen. No masses are identified; no abnormal focal contrast enhancement is seen. A small pericardial effusion is noted. Would correlate for any evidence of  pericarditis. The heart is mildly enlarged. A mildly enlarged 1.2 cm periaortic node is noted. No additional mediastinal lymphadenopathy is seen. The great vessels are grossly unremarkable in appearance. No axillary lymphadenopathy is seen. The visualized portions of the thyroid gland are unremarkable in appearance. An apparent postoperative  seroma is noted at the medial aspect of the left breast, measuring approximately 4.7 x 2.5 cm, with adjacent postoperative change. Hypodensities within the liver are only minimally changed in size from 2015 and likely reflect cysts, measuring up to 2.6 cm in size. The visualized portions of the spleen are unremarkable. A small hiatal hernia is noted. The visualized portions of the pancreas and adrenal glands are within normal limits. The patient is status post cholecystectomy, with clips noted at the gallbladder fossa. No acute osseous abnormalities are seen. Review of the MIP images confirms the above findings. IMPRESSION: 1. No evidence of pulmonary embolus. 2. Mild peripheral opacity at the left upper lung lobe, concerning for mild pneumonia. 3. Small pericardial effusion noted. Would correlate for any clinical evidence of pericarditis. 4. Mild cardiomegaly. 5. Mildly enlarged 1.2 cm periaortic node noted. 6. Apparent postoperative seroma at the medial aspect of the left breast, measuring 4.7 x 2.5 cm, with adjacent postoperative change. 7. Scattered hepatic cysts are mildly increased in size. 8. Small hiatal hernia seen. Electronically Signed   By: Garald Balding M.D.   On: 08/06/2015 19:18    Past Medical History  Diagnosis Date  . Arthritis   . Anxiety   . Hyperlipidemia     takes Atorvastatin 3 times a week  . Shingles   . Anemia   . Allergy     Nasonex daily as needed  . Wears glasses   . PONV (postoperative nausea and vomiting)   . Breast cancer ([place])      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  . S/P radiation therapy 07/18/2013-08/28/2013    1) Left Breast / 50 Gy in 25 fractions/ 2) Left Breast Boost / 10 Gy in 5 fractions  . Hypertension     takes Cardura nightly and Verapamil daily  . History of shingles   . Sinus drainage     put on Levaquin yesterday if no better in 3 days will start prednisone  . Headache(784.0)     rare  . Weakness     tingling and  numbness both hands and left leg  . Joint pain   . Chronic back pain     cyst sitting on L4-5;slipped disc  . GERD (gastroesophageal reflux disease)     takes Omeprazole daily  . History of gastric ulcer at age 58  . Diverticulosis   . Depression     takes Cymbalta daily  . Insomnia     takes Restoril nightly  . Thyroid disease      Allergies  Allergen Reactions  . Requip [Ropinirole Hcl] Shortness Of Breath and Nausea And Vomiting  . Codeine Other (See Comments)    fatigue  . Minocycline Hives  . Morphine Sulfate Other (See Comments)    Palpitations  . Tetracyclines & Related Hives    Pt doesn't remember a reaction  . Zestril [Lisinopril] Cough  . Zetia [Ezetimibe] Other (See Comments)    Muscle and joint pain  . Amoxicillin Rash    Pt doesn't remember a reaction  . Sulfa Antibiotics Other (See Comments)    Headache (pt states that a blood pressure medicine caused a severe headache but doesn't remember a  reaction to sulfa)      Current Outpatient Prescriptions on File Prior to Visit  Medication Sig Dispense Refill  . anastrozole (ARIMIDEX) 1 MG tablet Take 1 tablet (1 mg total) by mouth daily. 90 tablet 3  . aspirin EC 81 MG EC tablet Take 1 tablet (81 mg total) by mouth daily.    Marland Kitchen atorvastatin (LIPITOR) 80 MG tablet Take 0.5 tablets (40 mg total) by mouth daily. 90 tablet 3  . Calcium Carbonate-Vitamin D (CALTRATE 600+D) 600-400 MG-UNIT per chew tablet Chew 1 tablet by mouth daily. 180 tablet 4  . carvedilol (COREG) 6.25 MG tablet Take 1 tablet (6.25 mg total) by mouth 2 (two) times daily with a meal. 60 tablet 1  . Cholecalciferol (VITAMIN D) 2000 UNITS tablet Take 4,000 Units by mouth daily.    . cyclobenzaprine (FLEXERIL) 10 MG tablet Take 10 mg by mouth at bedtime.  1  . DULoxetine (CYMBALTA) 30 MG capsule Take 1 capsule (30 mg total) by mouth 3 (three) times daily. 270 capsule 3  . ferrous sulfate 325 (65 FE) MG tablet Take 1 tablet (325 mg total) by mouth 2 (two)  times daily with a meal. 60 tablet 1  . furosemide (LASIX) 20 MG tablet Take 1 tablet (20 mg total) by mouth 2 (two) times daily. 60 tablet 1  . gabapentin (NEURONTIN) 300 MG capsule Take 1 capsule (300 mg total) by mouth at bedtime. 90 capsule 1  . HYDROcodone-acetaminophen (NORCO/VICODIN) 5-325 MG tablet TAKE 1 TO 2 TABLETS BY MOUTH EVERY 6 TO 8 HOURS AS NEEDED FOR PAIN 45 tablet 0  . levofloxacin (LEVAQUIN) 750 MG tablet Take 1 tablet (750 mg total) by mouth once. 3 tablet 0  . levothyroxine (SYNTHROID, LEVOTHROID) 50 MCG tablet TAKE 1/2 OR 1 TABLET BY MOUTH EVERY MORNING 30-60 MINS BEFORE BREAKFAST ON AN EMPTY STOMACH 90 tablet 1  . meloxicam (MOBIC) 15 MG tablet TAKE 1/2 TO 1 TABLET DAILY WITH FOOR FOR PAIN *DONT TAKE WITH ALEVE OR IBUPROFEN CAN TAKE TYLENOL* (Patient taking differently: Take 7.5-15 mg by mouth daily. *DONT TAKE WITH ALEVE OR IBUPROFEN CAN TAKE TYLENOL*) 90 tablet 1  . mometasone (NASONEX) 50 MCG/ACT nasal spray USE 1 TO 2 SPRAYS IN EACH NOSTRIL TWICE DAILY 17 g 99  . ranitidine (ZANTAC) 300 MG tablet TAKE 1 TABLET BY MOUTH TWICE A DAY WHILE TRYING TO GET OFF PPI THEN CAN GO TO ONCE NIGHTLY 180 tablet 1  . sucralfate (CARAFATE) 1 g tablet TAKE 1 TABLET BY MOUTH 4 TIMES A DAY 360 tablet 1  . temazepam (RESTORIL) 30 MG capsule Take 1 capsule at hour of sleep as needed (Patient taking differently: Take 30 mg by mouth at bedtime as needed for sleep. ) 90 capsule 1  . traMADol (ULTRAM) 50 MG tablet TAKE 1 TABLET BY MOUTH 4 TIMES DAILY AS NEEDED (Patient taking differently: Take 50 mg by mouth every 6 (six) hours as needed for moderate pain. TAKE 1 TABLET BY MOUTH 4 TIMES DAILY AS NEEDED) 120 tablet 0   No current facility-administered medications on file prior to visit.    Review of Systems  Constitutional: Positive for malaise/fatigue. Negative for fever and chills.  Respiratory: Negative for cough, shortness of breath and wheezing.   Cardiovascular: Positive for chest pain.  Negative for palpitations and leg swelling.  Gastrointestinal: Positive for diarrhea. Negative for heartburn, abdominal pain, constipation, blood in stool and melena.  Genitourinary: Negative.   Neurological: Positive for weakness. Negative for dizziness, sensory change  and loss of consciousness.  Psychiatric/Behavioral: Positive for depression.     Physical Exam: Filed Weights   08/19/15 1407  Weight: 214 lb 9.6 oz (97.342 kg)   BP 150/90 mmHg  Pulse 96  Temp(Src) 98.1 F (36.7 C) (Temporal)  Resp 14  Ht 5\' 5"  (1.651 m)  Wt 214 lb 9.6 oz (97.342 kg)  BMI 35.71 kg/m2  SpO2 95% General Appearance: Well nourished, in no apparent distress. Eyes: PERRLA, EOMs, conjunctiva no swelling or erythema Sinuses: No Frontal/maxillary tenderness ENT/Mouth: Ext aud canals clear, TMs without erythema, bulging. No erythema, swelling, or exudate on post pharynx.  Tonsils not swollen or erythematous. Hearing normal.  Neck: Supple, thyroid normal.  Respiratory: Respiratory effort normal, BS equal bilaterally without rales, rhonchi, wheezing or stridor.  Cardio: RRR with no MRGs. Brisk peripheral pulses without edema.  Abdomen: Soft, + BS.  Non tender, no guarding, rebound, hernias, masses. Lymphatics: Non tender without lymphadenopathy.  Musculoskeletal: Full ROM, 5/5 strength, normal gait.  Skin: Warm, dry without rashes, lesions, ecchymosis.  Neuro: Cranial nerves intact. Normal muscle tone, no cerebellar symptoms. Sensation intact.  Psych: Awake and oriented X 3, normal affect, Insight and Judgment appropriate.     Starlyn Skeans, PA-C 2:09 PM [facility]

## 2015-08-20 DIAGNOSIS — M1711 Unilateral primary osteoarthritis, right knee: Secondary | ICD-10-CM | POA: Diagnosis not present

## 2015-08-21 NOTE — Addendum Note (Signed)
Addended by: Gabryel Files A on: 08/21/2015 02:12 PM   Modules accepted: Orders

## 2015-08-27 DIAGNOSIS — M1711 Unilateral primary osteoarthritis, right knee: Secondary | ICD-10-CM | POA: Diagnosis not present

## 2015-09-01 ENCOUNTER — Other Ambulatory Visit: Payer: Self-pay | Admitting: Physician Assistant

## 2015-09-02 ENCOUNTER — Ambulatory Visit (INDEPENDENT_AMBULATORY_CARE_PROVIDER_SITE_OTHER): Payer: Medicare Other | Admitting: Physician Assistant

## 2015-09-02 ENCOUNTER — Encounter: Payer: Self-pay | Admitting: Internal Medicine

## 2015-09-02 ENCOUNTER — Encounter: Payer: Self-pay | Admitting: Physician Assistant

## 2015-09-02 ENCOUNTER — Ambulatory Visit (INDEPENDENT_AMBULATORY_CARE_PROVIDER_SITE_OTHER): Payer: Medicare Other | Admitting: Internal Medicine

## 2015-09-02 VITALS — BP 104/78 | HR 80 | Ht 65.0 in | Wt 214.0 lb

## 2015-09-02 VITALS — BP 124/60 | HR 88 | Temp 98.2°F | Resp 18 | Ht 65.0 in | Wt 212.0 lb

## 2015-09-02 DIAGNOSIS — I255 Ischemic cardiomyopathy: Secondary | ICD-10-CM

## 2015-09-02 DIAGNOSIS — I4891 Unspecified atrial fibrillation: Secondary | ICD-10-CM | POA: Diagnosis not present

## 2015-09-02 DIAGNOSIS — R109 Unspecified abdominal pain: Secondary | ICD-10-CM

## 2015-09-02 DIAGNOSIS — I5021 Acute systolic (congestive) heart failure: Secondary | ICD-10-CM | POA: Diagnosis not present

## 2015-09-02 DIAGNOSIS — I428 Other cardiomyopathies: Secondary | ICD-10-CM

## 2015-09-02 DIAGNOSIS — D649 Anemia, unspecified: Secondary | ICD-10-CM | POA: Diagnosis not present

## 2015-09-02 DIAGNOSIS — I429 Cardiomyopathy, unspecified: Secondary | ICD-10-CM | POA: Diagnosis not present

## 2015-09-02 DIAGNOSIS — I10 Essential (primary) hypertension: Secondary | ICD-10-CM

## 2015-09-02 DIAGNOSIS — I420 Dilated cardiomyopathy: Secondary | ICD-10-CM | POA: Diagnosis not present

## 2015-09-02 LAB — CBC
HEMATOCRIT: 34.4 % — AB (ref 35.0–45.0)
Hemoglobin: 11.1 g/dL — ABNORMAL LOW (ref 11.7–15.5)
MCH: 26.1 pg — ABNORMAL LOW (ref 27.0–33.0)
MCHC: 32.3 g/dL (ref 32.0–36.0)
MCV: 80.8 fL (ref 80.0–100.0)
MPV: 10.3 fL (ref 7.5–12.5)
Platelets: 368 10*3/uL (ref 140–400)
RBC: 4.26 MIL/uL (ref 3.80–5.10)
RDW: 15.8 % — AB (ref 11.0–15.0)
WBC: 7.2 10*3/uL (ref 3.8–10.8)

## 2015-09-02 NOTE — Progress Notes (Signed)
Assessment and Plan:  1.  CHF secondary to HTN and Atrial Fibrillation -followed by cards -EF 30% -on appropriate heart failure regimen and is doing well with it. -AC held until Hgb returns to normal -cards did check cbc today.    2. Left flank pain -history of left sided renal stones on previous imaging - US Renal; Future - Urinalysis, Routine w reflex microscopic (not at [facility]) - Urine culture  Return to work Monday at half days x 1 week and then return to full duty.    HPI 70 y.o.female presents for 2 week follow up of . Patient reports that they have been doing well.  She reports that she went to see the cardiologist and that they told her that her EF 25-30%.  She reports that she is upset and scared that she will have something that can't be fixed.  She reports that she is not getting anticoagulated due to the anemia.  GI didn't want to do another colonoscopy as hemoccults were negative.    female is taking their medication.  They are having difficulty with their medications.  They report some  adverse reactions.  She is having some nausea.  She reports that it eventually goes away.  She reports that the wellbutrin hasn't made a huge impact yet but it has only been 2 weeks.  She reports that she feels like she gets over 1 hurdle and then another crosses her path.  She does report that the left kidney is bothering her.  She thinks that it is the stone that is moving.    Past Medical History  Diagnosis Date  . Arthritis   . Anxiety   . Hyperlipidemia     takes Atorvastatin 3 times a week  . Shingles   . Anemia   . Allergy     Nasonex daily as needed  . Wears glasses   . PONV (postoperative nausea and vomiting)   . Breast cancer ([place])      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  . S/P radiation therapy 07/18/2013-08/28/2013    1) Left Breast / 50 Gy in 25 fractions/ 2) Left Breast Boost / 10 Gy in 5 fractions  . Hypertension     takes Cardura nightly and  Verapamil daily  . History of shingles   . Sinus drainage     put on Levaquin yesterday if no better in 3 days will start prednisone  . Headache(784.0)     rare  . Weakness     tingling and numbness both hands and left leg  . Joint pain   . Chronic back pain     cyst sitting on L4-5;slipped disc  . GERD (gastroesophageal reflux disease)     takes Omeprazole daily  . History of gastric ulcer at age 51  . Diverticulosis   . Depression     takes Cymbalta daily  . Insomnia     takes Restoril nightly  . Thyroid disease      Allergies  Allergen Reactions  . Requip [Ropinirole Hcl] Shortness Of Breath and Nausea And Vomiting  . Codeine Other (See Comments)    fatigue  . Minocycline Hives  . Morphine Sulfate Other (See Comments)    Palpitations  . Tetracyclines & Related Hives    Pt doesn't remember a reaction  . Zestril [Lisinopril] Cough  . Zetia [Ezetimibe] Other (See Comments)    Muscle and joint pain  . Amoxicillin Rash    Pt doesn't remember  a reaction  . Sulfa Antibiotics Other (See Comments)    Headache (pt states that a blood pressure medicine caused a severe headache but doesn't remember a reaction to sulfa)      Current Outpatient Prescriptions on File Prior to Visit  Medication Sig Dispense Refill  . anastrozole (ARIMIDEX) 1 MG tablet Take 1 tablet (1 mg total) by mouth daily. 90 tablet 3  . aspirin EC 81 MG EC tablet Take 1 tablet (81 mg total) by mouth daily.    Marland Kitchen atorvastatin (LIPITOR) 80 MG tablet Take 0.5 tablets (40 mg total) by mouth daily. 90 tablet 3  . buPROPion (WELLBUTRIN XL) 150 MG 24 hr tablet Take 1 tablet (150 mg total) by mouth every morning. 30 tablet 2  . Calcium Carbonate-Vitamin D (CALTRATE 600+D) 600-400 MG-UNIT per chew tablet Chew 1 tablet by mouth daily. 180 tablet 4  . carvedilol (COREG) 6.25 MG tablet Take 1 tablet (6.25 mg total) by mouth 2 (two) times daily with a meal. 60 tablet 1  . Cholecalciferol (VITAMIN D) 2000 UNITS tablet Take  4,000 Units by mouth daily.    . cyclobenzaprine (FLEXERIL) 10 MG tablet Take 10 mg by mouth at bedtime.  1  . DULoxetine (CYMBALTA) 30 MG capsule Take 1 capsule (30 mg total) by mouth 3 (three) times daily. 270 capsule 3  . ferrous sulfate 325 (65 FE) MG tablet Take 1 tablet (325 mg total) by mouth 2 (two) times daily with a meal. 60 tablet 1  . furosemide (LASIX) 20 MG tablet Take 1 tablet (20 mg total) by mouth 2 (two) times daily. 60 tablet 1  . gabapentin (NEURONTIN) 300 MG capsule Take 1 capsule (300 mg total) by mouth at bedtime. 90 capsule 1  . HYDROcodone-acetaminophen (NORCO/VICODIN) 5-325 MG tablet TAKE 1 TO 2 TABLETS BY MOUTH EVERY 6 TO 8 HOURS AS NEEDED FOR PAIN 45 tablet 0  . hyoscyamine (LEVSIN SL) 0.125 MG SL tablet Place 1 tablet (0.125 mg total) under the tongue every 4 (four) hours as needed. 30 tablet 0  . levofloxacin (LEVAQUIN) 750 MG tablet Take 1 tablet (750 mg total) by mouth once. 3 tablet 0  . levothyroxine (SYNTHROID, LEVOTHROID) 50 MCG tablet TAKE 1/2 OR 1 TABLET BY MOUTH EVERY MORNING 30-60 MINS BEFORE BREAKFAST ON AN EMPTY STOMACH 90 tablet 1  . meloxicam (MOBIC) 15 MG tablet Take 15 mg by mouth daily. Take 1/2 to 1 tablet daily with food for pain. *Don't take with Aleve or Ibuprofen, can take Tylenol*    . mometasone (NASONEX) 50 MCG/ACT nasal spray USE 1 TO 2 SPRAYS IN EACH NOSTRIL TWICE DAILY 17 g 99  . ranitidine (ZANTAC) 300 MG tablet TAKE 1 TABLET BY MOUTH TWICE A DAY WHILE TRYING TO GET OFF PPI THEN CAN GO TO ONCE NIGHTLY 180 tablet 1  . sucralfate (CARAFATE) 1 g tablet TAKE 1 TABLET BY MOUTH 4 TIMES A DAY 360 tablet 1  . temazepam (RESTORIL) 30 MG capsule Take 30 mg by mouth at bedtime as needed for sleep.    . traMADol (ULTRAM) 50 MG tablet TAKE 1 TABLET BY MOUTH 4 TIMES A DAY AS NEEDED 120 tablet 0  . valsartan (DIOVAN) 40 MG tablet Take 1 tablet (40 mg total) by mouth daily. 30 tablet 0   No current facility-administered medications on file prior to visit.     ROS: all negative except above.   Physical Exam: Filed Weights   09/02/15 1408  Weight: 212 lb (96.163 kg)  BP 124/60 mmHg  Pulse 88  Temp(Src) 98.2 F (36.8 C) (Temporal)  Resp 18  Ht 5\' 5"  (1.651 m)  Wt 212 lb (96.163 kg)  BMI 35.28 kg/m2 General Appearance: Well developed well nourished, non-toxic appearing in no apparent distress. Eyes: PERRLA, EOMs, conjunctiva w/ no swelling or erythema or discharge Sinuses: No Frontal/maxillary tenderness ENT/Mouth: Ear canals clear without swelling or erythema.  TM's normal bilaterally with no retractions, bulging, or loss of landmarks.   Neck: Supple, thyroid normal, no notable JVD  Respiratory: Respiratory effort normal, Clear breath sounds anteriorly and posteriorly bilaterally without rales, rhonchi, wheezing or stridor. No retractions or accessory muscle usage. Cardio: RRR with no MRGs.   Abdomen: Soft, + BS.  Non tender, no guarding, rebound, hernias, masses.  Musculoskeletal: Full ROM, 5/5 strength, normal gait.  Skin: Warm, dry without rashes  Neuro: Awake and oriented X 3, Cranial nerves intact. Normal muscle tone, no cerebellar symptoms. Sensation intact.  Psych: normal affect, Insight and Judgment appropriate.     Starlyn Skeans, PA-C 2:41 PM [facility]

## 2015-09-02 NOTE — Progress Notes (Signed)
Cardiology Office Note   Date:  09/02/2015   ID:  [name], [name] 03-Nov-1945, MRN [id number]  PCP:  [name] Richards, MD  Cardiologist:  Dr. Ellyn [name] (remotely seen by Dr. Domenic [name], however now lives in [place])  Chief Complaint  Patient presents with  . Hospitalization Follow-up    seen for Dr. Ellyn [name]      History of Present Illness: [name] [name] is a 70 y.o. female who presents for outpatient cardiology visit. She has a history of left bundle branch block dating as far back as 2007, left breast cancer, hyperlipidemia, thyroid disease. She was admitted on 08/06/2015 after presented to her PCPs office with complaint of chest pain. Echocardiogram obtained on 07/30/2015 showed EF 25-30%, grade 2 DD, paradoxical septal wall motion abnormality, PA peak pressure 34 mmHg, small to moderate free-flowing pericardial effusion circumferential to the heart. There was no tamponade symptom or physiology. She was also noted to be in new atrial fibrillation, however quickly converted to sinus rhythm. Although serial troponin was negative, however given the abnormal echocardiogram and her symptom concerning for angina, she underwent cardiac catheterization on 08/10/2015 which showed normal filling pressures, no angiographic evidence of CAD, nonischemic cardiomyopathy. Her verapamil was stopped. She does have CHA2DS2-Vasc score 3 (age, female, HF), per discharge summary, it appears the decision was made to not start anticoagulation at this time given significant anemia and she was discharged on aspirin. It appears she was transfused one unit of packed red blood cells during the last admission. Her hemoglobin did go down as low as 7.4 during the admission. Hospitalist service as discussed with GI service who is currently not planning for any endoscopy or colonoscopy.  Since her discharge, she has been doing very well. Only complaint is her intermittent left lower back pain which is more  noticeable with sitting up. She denies any recent UTI symptoms including any dysuria and is planning to see her PCP tomorrow. Her hemoglobin is trending up, and the pain is intermittent, I doubt retroperitoneal bleeding. It could be musculoskeletal or UTI, area is tender on percussion. Otherwise she has been doing relatively well without significant lower extremity edema or swelling. She did have a repeat basic metabolic panel since discharge that shows her renal function is stable on the current dose of Lasix. I will hold off on repeating another one. I do want to recheck CBC. She denies ever knowing any current bleeding issue, however it is unclear why her hemoglobin dropped down to 7.4. If she does not have any significant drop in the hemoglobin in the next 2-3 month, I think it is worth awhile to try eliquis. I have discussed with her benefit and risk of systemic anticoagulation and why she was discharged only on aspirin. She is aware that starting systemic anticoagulation means decreased stroke risk but increased bleeding risk. She has not seen Dr. Domenic [name] in several years and she is now living [place] and does not want to drive to [place] area, I will set her up to see Dr. Ellyn [name] in 3 month. I will repeat echocardiogram in 3 month prior to her next follow-up since she is on good heart failure medications this time. Her blood pressure is borderline low today 104/78. I will hold off on starting spironolactone as this time.    Past Medical History  Diagnosis Date  . Arthritis   . Anxiety   . Hyperlipidemia     takes Atorvastatin 3 times a week  . Shingles   .  Anemia   . Allergy     Nasonex daily as needed  . Wears glasses   . PONV (postoperative nausea and vomiting)   . 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 S/P radiation therapy 07/18/2013-08/28/2013    1) Left Breast / 50 Gy in 25 fractions/ 2) Left Breast Boost / 10 Gy in 5 fractions    . Hypertension     takes Cardura nightly and Verapamil daily  . History of shingles   . Sinus drainage     put on Levaquin yesterday if no better in 3 days will start prednisone  . Headache(784.0)     rare  . Weakness     tingling and numbness both hands and left leg  . Joint pain   . Chronic back pain     cyst sitting on L4-5;slipped disc  . GERD (gastroesophageal reflux disease)     takes Omeprazole daily  . History of gastric ulcer at age 36  . Diverticulosis   . Depression     takes Cymbalta daily  . Insomnia     takes Restoril nightly  . Thyroid disease     Past Surgical History  Procedure Laterality Date  . Cataract extraction bilateral w/ anterior vitrectomy Bilateral   . Abdominal hysterectomy    . Cholecystectomy    . Knee arthroscopy Bilateral   . Tonsillectomy    . Colonoscopy  2014  . Upper gi endoscopy    . Breast surgery      lt lump-neg  . Dupuytren / palmar fasciotomy Bilateral 2007    x 3 to left and once to the right   . Tubal ligation    . Foot surgery Right   . Epidural injections      x 3  . Lumbar fusion  10/2013    Dr. Lynann [name]  . Cardiac catheterization N/A 08/10/2015    Procedure: Right/Left Heart Cath and Coronary Angiography;  Surgeon: [name] Blanks, MD;  Location: [facility];  Service: Cardiovascular;  Laterality: N/A;     Current Outpatient Prescriptions  Medication Sig Dispense Refill  . meloxicam (MOBIC) 15 MG tablet Take 15 mg by mouth daily. Take 1/2 to 1 tablet daily with food for pain. *Don't take with Aleve or Ibuprofen, can take Tylenol*    . temazepam (RESTORIL) 30 MG capsule Take 30 mg by mouth at bedtime as needed for sleep.    Marland Kitchen anastrozole (ARIMIDEX) 1 MG tablet Take 1 tablet (1 mg total) by mouth daily. 90 tablet 3  . aspirin EC 81 MG EC tablet Take 1 tablet (81 mg total) by mouth daily.    Marland Kitchen atorvastatin (LIPITOR) 80 MG tablet Take 0.5 tablets (40 mg total) by mouth daily. 90 tablet 3  . buPROPion  (WELLBUTRIN XL) 150 MG 24 hr tablet Take 1 tablet (150 mg total) by mouth every morning. 30 tablet 2  . Calcium Carbonate-Vitamin D (CALTRATE 600+D) 600-400 MG-UNIT per chew tablet Chew 1 tablet by mouth daily. 180 tablet 4  . carvedilol (COREG) 6.25 MG tablet Take 1 tablet (6.25 mg total) by mouth 2 (two) times daily with a meal. 60 tablet 1  . Cholecalciferol (VITAMIN D) 2000 UNITS tablet Take 4,000 Units by mouth daily.    . cyclobenzaprine (FLEXERIL) 10 MG tablet Take 10 mg by mouth at bedtime.  1  . DULoxetine (CYMBALTA) 30 MG capsule Take 1 capsule (30 mg total) by  mouth 3 (three) times daily. 270 capsule 3  . ferrous sulfate 325 (65 FE) MG tablet Take 1 tablet (325 mg total) by mouth 2 (two) times daily with a meal. 60 tablet 1  . furosemide (LASIX) 20 MG tablet Take 1 tablet (20 mg total) by mouth 2 (two) times daily. 60 tablet 1  . gabapentin (NEURONTIN) 300 MG capsule Take 1 capsule (300 mg total) by mouth at bedtime. 90 capsule 1  . HYDROcodone-acetaminophen (NORCO/VICODIN) 5-325 MG tablet TAKE 1 TO 2 TABLETS BY MOUTH EVERY 6 TO 8 HOURS AS NEEDED FOR PAIN 45 tablet 0  . hyoscyamine (LEVSIN SL) 0.125 MG SL tablet Place 1 tablet (0.125 mg total) under the tongue every 4 (four) hours as needed. 30 tablet 0  . levofloxacin (LEVAQUIN) 750 MG tablet Take 1 tablet (750 mg total) by mouth once. 3 tablet 0  . levothyroxine (SYNTHROID, LEVOTHROID) 50 MCG tablet TAKE 1/2 OR 1 TABLET BY MOUTH EVERY MORNING 30-60 MINS BEFORE BREAKFAST ON AN EMPTY STOMACH 90 tablet 1  . mometasone (NASONEX) 50 MCG/ACT nasal spray USE 1 TO 2 SPRAYS IN EACH NOSTRIL TWICE DAILY 17 g 99  . ranitidine (ZANTAC) 300 MG tablet TAKE 1 TABLET BY MOUTH TWICE A DAY WHILE TRYING TO GET OFF PPI THEN CAN GO TO ONCE NIGHTLY 180 tablet 1  . sucralfate (CARAFATE) 1 g tablet TAKE 1 TABLET BY MOUTH 4 TIMES A DAY 360 tablet 1  . traMADol (ULTRAM) 50 MG tablet TAKE 1 TABLET BY MOUTH 4 TIMES A DAY AS NEEDED 120 tablet 0  . valsartan (DIOVAN)  40 MG tablet Take 1 tablet (40 mg total) by mouth daily. 30 tablet 0   No current facility-administered medications for this visit.    Allergies:   Requip; Codeine; Minocycline; Morphine sulfate; Tetracyclines & related; Zestril; Zetia; Amoxicillin; and Sulfa antibiotics    Social History:  The patient  reports that she has never smoked. She has never used smokeless tobacco. She reports that she drinks alcohol. She reports that she does not use illicit drugs.   Family History:  The patient's family history includes COPD in her father; Heart attack in her mother; Hypertension in her father; Stroke in her mother. There is no history of Colon polyps, Esophageal cancer, Rectal cancer, or Stomach cancer.    ROS:  Please see the history of present illness.   Otherwise, review of systems are positive for L lower back pain.   All other systems are reviewed and negative.    PHYSICAL EXAM: VS:  BP 104/78 mmHg  Pulse 80  Ht 5\' 5"  (1.651 m)  Wt 214 lb (97.07 kg)  BMI 35.61 kg/m2 , BMI Body mass index is 35.61 kg/(m^2). GEN: Well nourished, well developed, in no acute distress HEENT: normal Neck: no JVD, carotid bruits, or masses Cardiac: RRR; no murmurs, rubs, or gallops,no edema  Respiratory:  clear to auscultation bilaterally, normal work of breathing GI: soft, nontender, nondistended, + BS MS: no deformity or atrophy Skin: warm and dry, no rash Neuro:  Strength and sensation are intact Psych: euthymic mood, full affect   EKG:  EKG is ordered today. The ekg ordered today demonstrates normal sinus rhythm with left bundle branch block.   Recent Labs: 06/23/2015: Magnesium 1.9; TSH 1.10 07/13/2015: ALT 11 08/19/2015: BUN 12; Creat 0.66; Potassium 4.1; Sodium 140 09/02/2015: Hemoglobin 11.1*; Platelets 368    Lipid Panel    Component Value Date/Time   CHOL 122 08/07/2015 0450   TRIG 52 08/07/2015  0450   HDL 39* 08/07/2015 0450   CHOLHDL 3.1 08/07/2015 0450   VLDL 10 08/07/2015 0450     LDLCALC 73 08/07/2015 0450      Wt Readings from Last 3 Encounters:  09/02/15 212 lb (96.163 kg)  09/02/15 214 lb (97.07 kg)  08/19/15 214 lb 9.6 oz (97.342 kg)      Other studies Reviewed: Additional studies/ records that were reviewed today include:   Echo 08/07/2015 LV EF: 25% - 30%  ------------------------------------------------------------------- Indications: Dyspnea 786.09.  ------------------------------------------------------------------- History: PMH: LBBB. Atrial fibrillation. Risk factors: Hypertension. Dyslipidemia.  ------------------------------------------------------------------- Study Conclusions  - Procedure narrative: Transthoracic echocardiography. Image  quality was poor. The study was technically difficult, as a  result of poor sound wave transmission and body habitus. - Left ventricle: The cavity size was normal. Wall thickness was  normal. Systolic function was severely reduced. The estimated  ejection fraction was in the range of 25% to 30%. Severe diffuse  hypokinesis with no identifiable regional variations. Features  are consistent with a pseudonormal left ventricular filling  pattern, with concomitant abnormal relaxation and increased  filling pressure (grade 2 diastolic dysfunction). - Ventricular septum: Septal motion showed abnormal function,  dyssynergy, and paradox. These changes are consistent with a left  bundle branch block. - Left atrium: The atrium was mildly dilated. - Atrial septum: No defect or patent foramen ovale was identified. - Pulmonary arteries: Systolic pressure was mildly increased. PA  peak pressure: 34 mm Hg (S). - Pericardium, extracardiac: A small to moderate, free-flowing  pericardial effusion was identified circumferential to the heart.  The fluid had no internal echoes.There was no evidence of  hemodynamic compromise.  Impressions:  - The pericardial effusion and severe LV  dysfunction are new since  2008.   Cath 08/10/2015 Conclusion    1. Non-ischemic cardiomyopathy 2. No angiographic evidence of CAD 3. Normal filling pressures.   Recommendations: Medical management of non-ischemic cardiomyopathy. New diagnosis of atrial fibrillation. Her cardiomyopathy could be rate related.      Review of the above records demonstrates:   Recently admitted to the hospital with chest pain, echocardiogram shows decreased ejection fraction, she underwent cardiac catheterization which showed no significant coronary artery disease. She is deemed to have nonischemic cardiopathy. She is currently on medical management.   ASSESSMENT AND PLAN:  1.  NICM with EF 25-30%, unclear cause  - continue coreg and valsartan. She will need repeat echo in 3 month  - no obvious sign of HF on exam, will continue current dose of lasix, BMET was recently checked which showed stable renal function, I will not recheck it today. Consider add spironolactone if BP come up on followup   2. Pericardial effusion: no pericardial rub on exam, will continue current medication, pending repeat echo in 3 month  3. New PAF on ASA  - CHA2DS2-Vasc score 3 (female, age, HF)  - did not start on systemic anticoagulation due to significant anemia during hospitalization. Unclear cause. GI has no plan for endoscopy or colonoscopy at this time. Hgb is trending up. Recheck CBC today, if not bleeding issue, may consider add low dose eliquis and stop ASA on next followup  4. Anemia: continue monitor, CBC today   Current medicines are reviewed at length with the patient today.  The patient does not have concerns regarding medicines.  The following changes have been made:  no change  Labs/ tests ordered today include:   Orders Placed This Encounter  Procedures  . CBC  .  EKG 12-Lead  . ECHOCARDIOGRAM COMPLETE     Disposition:   FU with Dr. Ellyn [name] in 3 months  Signed, Almyra Deforest, [place]  09/02/2015 11:40 PM     [address], [place], [place]  [address] Phone: [phone]; Fax: [phone]

## 2015-09-02 NOTE — Patient Instructions (Addendum)
Medication Instructions:   Your physician recommends that you continue on your current medications as directed. Please refer to the Current Medication list given to you today.   If you need a refill on your cardiac medications before your next appointment, please call your pharmacy.  Labwork: CBC TODAY    Testing/Procedures: A COUPLE OF WEEKS BEFORE FOLLOW UP WITH DR Uh [facility] Your physician has requested that you have an echocardiogram. Echocardiography is a painless test that uses sound waves to create images of your heart. It provides your doctor with information about the size and shape of your heart and how well your heart's chambers and valves are working. This procedure takes approximately one hour. There are no restrictions for this procedure.     Follow-Up: WITH DR Ellyn Hack IN 3 MONTHS    Any Other Special Instructions Will Be Listed Below (If Applicable).

## 2015-09-03 DIAGNOSIS — M1711 Unilateral primary osteoarthritis, right knee: Secondary | ICD-10-CM | POA: Diagnosis not present

## 2015-09-03 LAB — URINALYSIS, ROUTINE W REFLEX MICROSCOPIC
BILIRUBIN URINE: NEGATIVE
GLUCOSE, UA: NEGATIVE
HGB URINE DIPSTICK: NEGATIVE
Ketones, ur: NEGATIVE
Leukocytes, UA: NEGATIVE
Nitrite: NEGATIVE
PROTEIN: NEGATIVE
Specific Gravity, Urine: 1.014 (ref 1.001–1.035)
pH: 5.5 (ref 5.0–8.0)

## 2015-09-03 NOTE — Telephone Encounter (Signed)
RX CALLED INTO [facility]. 

## 2015-09-04 ENCOUNTER — Ambulatory Visit: Payer: Self-pay | Admitting: Internal Medicine

## 2015-09-04 LAB — URINE CULTURE

## 2015-09-08 ENCOUNTER — Ambulatory Visit
Admission: RE | Admit: 2015-09-08 | Discharge: 2015-09-08 | Disposition: A | Discharge status: Home / Self Care | Payer: Medicare Other | Source: Ambulatory Visit | Attending: Internal Medicine | Admitting: Internal Medicine

## 2015-09-08 ENCOUNTER — Other Ambulatory Visit: Payer: Self-pay | Admitting: Internal Medicine

## 2015-09-08 DIAGNOSIS — R109 Unspecified abdominal pain: Secondary | ICD-10-CM | POA: Diagnosis not present

## 2015-09-10 DIAGNOSIS — M1711 Unilateral primary osteoarthritis, right knee: Secondary | ICD-10-CM | POA: Diagnosis not present

## 2015-09-15 ENCOUNTER — Other Ambulatory Visit: Payer: Self-pay | Admitting: Internal Medicine

## 2015-09-15 ENCOUNTER — Encounter: Payer: Self-pay | Admitting: Physician Assistant

## 2015-09-15 ENCOUNTER — Ambulatory Visit (INDEPENDENT_AMBULATORY_CARE_PROVIDER_SITE_OTHER): Payer: Medicare Other | Admitting: Physician Assistant

## 2015-09-15 VITALS — BP 144/70 | HR 84 | Temp 98.0°F | Resp 18 | Ht 65.0 in | Wt 218.0 lb

## 2015-09-15 DIAGNOSIS — M546 Pain in thoracic spine: Secondary | ICD-10-CM | POA: Diagnosis not present

## 2015-09-15 DIAGNOSIS — B354 Tinea corporis: Secondary | ICD-10-CM

## 2015-09-15 MED ORDER — CLOTRIMAZOLE-BETAMETHASONE 1-0.05 % EX CREA: 1.0000 "application " | TOPICAL_CREAM | Freq: Two times a day (BID) | CUTANEOUS | Status: DC

## 2015-09-15 NOTE — Progress Notes (Signed)
   Subjective:    Patient ID: [name] [name], female    DOB: [birth year], 70 y.o.   MRN: [id number]  HPI 70 y.o. WF presents with rash on left breast x 2 days.  She also continues to have left back pain under left scapula, worse with lying on her side, TTP, if she sits for a long time she will start to have pain, nothing makes it better, had CXR 07/2015, DEXA 2015, normal renal US 08/2015, has been having indigestion and some epigastric pain is s/p choley.  Denies fever, chills, SOB, CP, PND, orthpnea.   Review of Systems  Constitutional: Negative.  Negative for fever and chills.  HENT: Negative.   Respiratory: Negative.   Cardiovascular: Negative.   Gastrointestinal: Negative.  Negative for diarrhea.  Genitourinary: Negative.   Musculoskeletal: Positive for myalgias and back pain. Negative for arthralgias.  Skin: Positive for rash. Negative for color change and wound.  Neurological: Negative.  Negative for dizziness.       Objective:   Physical Exam  Musculoskeletal:  + left thoracic spine tenderness with paraspinus tenderness to the left, no rash, + pain with rotation no pain with flexion/extensin of her back.   Skin: Rash (left axilla/breast with well circumscribed erythematous raised 1inx2inch oval, will smaller version under left breast) noted.       Assessment & Plan:  1. Left-sided thoracic back pain - DG Thoracic Spine W/Swimmers; Future  2. Tinea corporis - clotrimazole-betamethasone (LOTRISONE) cream; Apply 1 application topically 2 (two) times daily.  Dispense: 15 g; Refill: 2 If not better follow up in the office

## 2015-09-15 NOTE — Patient Instructions (Signed)
Thoracic Strain A thoracic strain, which is sometimes called a mid-back strain, is an injury to the muscles or tendons that attach to the upper part of your back behind your chest. This type of injury occurs when a muscle is overstretched or overloaded.  Thoracic strains can range from mild to severe. Mild strains may involve stretching a muscle or tendon without tearing it. These injuries may heal in 1-2 weeks. More severe strains involve tearing of muscle fibers or tendons. These will cause more pain and may take 6-8 weeks to heal. CAUSES This condition may be caused by:  An injury in which a sudden force is placed on the muscle.  Exercising without properly warming up.  Overuse of the muscle.  Improper form during certain movements.  Other injuries that surround or cause stress on the mid-back, causing a strain on the muscles. In some cases, the cause may not be known. RISK FACTORS This injury is more common in:  Athletes.  People with obesity. SYMPTOMS The main symptom of this condition is pain, especially with movement. Other symptoms include:  Bruising.  Swelling.  Spasm. DIAGNOSIS This condition may be diagnosed with a physical exam. X-rays may be taken to check for a fracture. TREATMENT This condition may be treated with:  Resting and icing the injured area.  Physical therapy. This will involve doing stretching and strengthening exercises.  Medicines for pain and inflammation. HOME CARE INSTRUCTIONS  Rest as needed. Follow instructions from your health care provider about any restrictions on activity.  If directed, apply ice to the injured area:  Put ice in a plastic bag.  Place a towel between your skin and the bag.  Leave the ice on for 20 minutes, 2-3 times per day.  Take over-the-counter and prescription medicines only as told by your health care provider.  Begin doing exercises as told by your health care provider or physical therapist.  Always  warm up properly before physical activity or sports.  Bend your knees before you lift heavy objects.  Keep all follow-up visits as told by your health care provider. This is important. SEEK MEDICAL CARE IF:  Your pain is not helped by medicine.  Your pain, bruising, or swelling is getting worse.  You have a fever. SEEK IMMEDIATE MEDICAL CARE IF:  You have shortness of breath.  You have chest pain.  You develop numbness or weakness in your legs.  You have involuntary loss of urine (urinary incontinence).   This information is not intended to replace advice given to you by your health care provider. Make sure you discuss any questions you have with your health care provider.   Document Released: 06/18/2003 Document Revised: 12/17/2014 Document Reviewed: 05/22/2014 Elsevier Interactive Patient Education 2016 [employer]  

## 2015-09-16 ENCOUNTER — Ambulatory Visit (HOSPITAL_COMMUNITY)
Admission: RE | Admit: 2015-09-16 | Discharge: 2015-09-16 | Disposition: A | Discharge status: Home / Self Care | Payer: Medicare Other | Source: Ambulatory Visit | Attending: Physician Assistant | Admitting: Physician Assistant

## 2015-09-16 DIAGNOSIS — M546 Pain in thoracic spine: Secondary | ICD-10-CM | POA: Insufficient documentation

## 2015-09-16 DIAGNOSIS — M47814 Spondylosis without myelopathy or radiculopathy, thoracic region: Secondary | ICD-10-CM | POA: Diagnosis not present

## 2015-09-17 DIAGNOSIS — M79671 Pain in right foot: Secondary | ICD-10-CM | POA: Diagnosis not present

## 2015-09-17 DIAGNOSIS — M1711 Unilateral primary osteoarthritis, right knee: Secondary | ICD-10-CM | POA: Diagnosis not present

## 2015-09-18 ENCOUNTER — Encounter: Payer: Self-pay | Admitting: Physician Assistant

## 2015-09-24 ENCOUNTER — Ambulatory Visit (INDEPENDENT_AMBULATORY_CARE_PROVIDER_SITE_OTHER): Payer: Medicare Other | Admitting: Physician Assistant

## 2015-09-24 ENCOUNTER — Encounter: Payer: Self-pay | Admitting: Physician Assistant

## 2015-09-24 VITALS — BP 122/74 | HR 87 | Temp 97.9°F | Resp 16 | Ht 65.0 in | Wt 219.0 lb

## 2015-09-24 DIAGNOSIS — I471 Supraventricular tachycardia, unspecified: Secondary | ICD-10-CM

## 2015-09-24 DIAGNOSIS — I5021 Acute systolic (congestive) heart failure: Secondary | ICD-10-CM

## 2015-09-24 DIAGNOSIS — D649 Anemia, unspecified: Secondary | ICD-10-CM

## 2015-09-24 DIAGNOSIS — Z79899 Other long term (current) drug therapy: Secondary | ICD-10-CM

## 2015-09-24 DIAGNOSIS — C50312 Malignant neoplasm of lower-inner quadrant of left female breast: Secondary | ICD-10-CM

## 2015-09-24 DIAGNOSIS — E782 Mixed hyperlipidemia: Secondary | ICD-10-CM

## 2015-09-24 DIAGNOSIS — Z87898 Personal history of other specified conditions: Secondary | ICD-10-CM

## 2015-09-24 DIAGNOSIS — J309 Allergic rhinitis, unspecified: Secondary | ICD-10-CM

## 2015-09-24 DIAGNOSIS — Z8679 Personal history of other diseases of the circulatory system: Secondary | ICD-10-CM

## 2015-09-24 DIAGNOSIS — I4891 Unspecified atrial fibrillation: Secondary | ICD-10-CM

## 2015-09-24 DIAGNOSIS — F341 Dysthymic disorder: Secondary | ICD-10-CM

## 2015-09-24 DIAGNOSIS — Z0000 Encounter for general adult medical examination without abnormal findings: Secondary | ICD-10-CM

## 2015-09-24 DIAGNOSIS — R7309 Other abnormal glucose: Secondary | ICD-10-CM | POA: Diagnosis not present

## 2015-09-24 DIAGNOSIS — I420 Dilated cardiomyopathy: Secondary | ICD-10-CM

## 2015-09-24 DIAGNOSIS — E038 Other specified hypothyroidism: Secondary | ICD-10-CM

## 2015-09-24 DIAGNOSIS — M5416 Radiculopathy, lumbar region: Secondary | ICD-10-CM

## 2015-09-24 DIAGNOSIS — R7303 Prediabetes: Secondary | ICD-10-CM

## 2015-09-24 DIAGNOSIS — E559 Vitamin D deficiency, unspecified: Secondary | ICD-10-CM

## 2015-09-24 DIAGNOSIS — E669 Obesity, unspecified: Secondary | ICD-10-CM

## 2015-09-24 DIAGNOSIS — M51369 Other intervertebral disc degeneration, lumbar region without mention of lumbar back pain or lower extremity pain: Secondary | ICD-10-CM

## 2015-09-24 DIAGNOSIS — K219 Gastro-esophageal reflux disease without esophagitis: Secondary | ICD-10-CM

## 2015-09-24 DIAGNOSIS — I10 Essential (primary) hypertension: Secondary | ICD-10-CM | POA: Diagnosis not present

## 2015-09-24 DIAGNOSIS — M5136 Other intervertebral disc degeneration, lumbar region: Secondary | ICD-10-CM

## 2015-09-24 DIAGNOSIS — Z8719 Personal history of other diseases of the digestive system: Secondary | ICD-10-CM

## 2015-09-24 LAB — CBC WITH DIFFERENTIAL/PLATELET
BASOS ABS: 69 {cells}/uL (ref 0–200)
Basophils Relative: 1 %
EOS PCT: 3 %
Eosinophils Absolute: 207 cells/uL (ref 15–500)
HCT: 34.6 % — ABNORMAL LOW (ref 35.0–45.0)
Hemoglobin: 11.2 g/dL — ABNORMAL LOW (ref 11.7–15.5)
LYMPHS ABS: 1725 {cells}/uL (ref 850–3900)
Lymphocytes Relative: 25 %
MCH: 26.7 pg — AB (ref 27.0–33.0)
MCHC: 32.4 g/dL (ref 32.0–36.0)
MCV: 82.4 fL (ref 80.0–100.0)
MONOS PCT: 7 %
MPV: 10.4 fL (ref 7.5–12.5)
Monocytes Absolute: 483 cells/uL (ref 200–950)
NEUTROS ABS: 4416 {cells}/uL (ref 1500–7800)
NEUTROS PCT: 64 %
PLATELETS: 349 10*3/uL (ref 140–400)
RBC: 4.2 MIL/uL (ref 3.80–5.10)
RDW: 17.2 % — ABNORMAL HIGH (ref 11.0–15.0)
WBC: 6.9 10*3/uL (ref 3.8–10.8)

## 2015-09-24 MED ORDER — CARVEDILOL 12.5 MG PO TABS
12.5000 mg | ORAL_TABLET | Freq: Two times a day (BID) | ORAL | Status: DC
Start: 2015-09-24 — End: 2015-11-16

## 2015-09-24 MED ORDER — CARVEDILOL 6.25 MG PO TABS: 6.2500 mg | ORAL_TABLET | Freq: Two times a day (BID) | ORAL | Status: DC

## 2015-09-24 NOTE — Patient Instructions (Signed)
Do the following things EVERYDAY: 1) Weigh yourself in the morning before breakfast. Write it down and keep it in a log. 2) Take your medicines as prescribed 3) Eat low salt foods-Limit salt (sodium) to 2000 mg per day. Best thing to do is avoid processed foods.   4) Stay as active as you can everyday 5) Limit all fluids for the day to less than 2 liters  Call your doctor if:  Anytime you have any of the following symptoms:  1) 3 pound weight gain in 24 hours or 5 pounds in 1 week  2) shortness of breath, with or without a dry hacking cough  3) swelling in the hands, feet or stomach  4) if you have to sleep on extra pillows at night in order to breathe. 5) after laying down at night for 20-30 mins, you wake up short of breath.   These can all be signs of fluid overload.   Diabetes is a very complicated disease...lets simplify it.  An easy way to look at it to understand the complications is if you think of the extra sugar floating in your blood stream as glass shards floating through your blood stream.    Diabetes affects your small vessels first: 1) The glass shards (sugar) scraps down the tiny blood vessels in your eyes and lead to diabetic retinopathy, the leading cause of blindness in the [place]. Diabetes is the leading cause of newly diagnosed adult (30 to 70 years of age) blindness in the [place].  2) The glass shards scratches down the tiny vessels of your legs leading to nerve damage called neuropathy and can lead to amputations of your feet. More than 60% of all non-traumatic amputations of lower limbs occur in people with diabetes.  3) Over time the small vessels in your brain are shredded and closed off, individually this does not cause any problems but over a long period of time many of the small vessels being blocked can lead to Vascular Dementia.   4) Your kidney's are a filter system and have a "net" that keeps certain things in the body and lets bad things out. Sugar  shreds this net and leads to kidney damage and eventually failure. Decreasing the sugar that is destroying the net and certain blood pressure medications can help stop or decrease progression of kidney disease. Diabetes was the primary cause of kidney failure in 44 percent of all new cases in 2011.  5) Diabetes also destroys the small vessels in your penis that lead to erectile dysfunction. Eventually the vessels are so damaged that you may not be responsive to cialis or viagra.   Diabetes and your large vessels: Your larger vessels consist of your coronary arteries in your heart and the carotid vessels to your brain. Diabetes or even increased sugars put you at 300% increased risk of heart attack and stroke and this is why.. The sugar scrapes down your large blood vessels and your body sees this as an internal injury and tries to repair itself. Just like you get a scab on your skin, your platelets will stick to the blood vessel wall trying to heal it. This is why we have diabetics on low dose aspirin daily, this prevents the platelets from sticking and can prevent plaque formation. In addition, your body takes cholesterol and tries to shove it into the open wound. This is why we want your LDL, or bad cholesterol, below 70.   The combination of platelets and cholesterol over 5-10  years forms plaque that can break off and cause a heart attack or stroke.   PLEASE REMEMBER:  Diabetes is preventable! Up to 80 percent of complications and morbidities among individuals with type 2 diabetes can be prevented, delayed, or effectively treated and minimized with regular visits to a health professional, appropriate monitoring and medication, and a healthy diet and lifestyle.

## 2015-09-24 NOTE — Progress Notes (Signed)
CPE AND 3 month OV  Assessment:   1. Essential hypertension - continue medications, DASH diet, exercise and monitor at home. Call if greater than 130/80. - CBC with Differential/Platelet - BASIC METABOLIC PANEL WITH GFR - Hepatic function panel - TSH - Urinalysis, Routine w reflex microscopic - Microalbumin / creatinine urine ratio  2. H/O left bundle branch block No CP/SOB  3. Paroxysmal supraventricular tachycardia avoid triggers such as alcohol, smoking, caffeine etc, Valsalva taught, call if worsening palpations, dizziness, CP, SOB.  4. Prediabetes Discussed general issues about diabetes pathophysiology and management., Educational material distributed., Suggested low cholesterol diet., Encouraged aerobic exercise., Discussed foot care., Reminded to get yearly retinal exam. - Hemoglobin A1c - Insulin, fasting  5. Hyperlipidemia -continue medications, check lipids, decrease fatty foods, increase activity.  - Lipid panel  6. Cancer of lower-inner quadrant of left female breast Continue follow up Dr. Griffith Citron  7. Obesity (BMI 30-39.9) Obesity with co morbidities- long discussion about weight loss, diet, and exercise  8. Vitamin D deficiency - Vit D  25 hydroxy (rtn osteoporosis monitoring)  9. Medication management - Magnesium  10. History of pancreatitis controlled  11. 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   12. Allergic rhinitis, unspecified allergic rhinitis type Allergic rhinitis- Allegra OTC, increase H20, allergy hygiene explained.  13. Gastroesophageal reflux disease, esophagitis presence not specified GERD- will try to get off PPiI given info for taper and zantac sent in  14. Lumbar radiculopathy Better since surgery.   15. DDD (degenerative disc disease), lumbar RICE, NSAIDS, exercises given, follow up ortho  16. Systolic CHF, acute (HCC) Optimized meds, increase coreg, monitor weight, follow up cardio, restrict  fluid/salt/sugar  17. Congestive dilated cardiomyopathy (HCC) Optimized meds, increase coreg, monitor weight, follow up cardio, restrict fluid/salt/sugar  18. Atrial fibrillation, unspecified type ([place]) occ flutter, will increase coreg, continue ASA for now, much may be able to add anticoagulation due to increase H/H  19. Other specified hypothyroidism Hypothyroidism-check TSH level, continue medications the same, reminded to take on an empty stomach 30-73mins before food.   20. History of ulcer disease Continue medications, avoid NSAIDS  21. Anemia, unspecified anemia type Will recheck iron, ferritin, and CBC, continue iron. Had normal colon 2014, may need EGD  Future Appointments Date Time Provider [place]  11/23/2015 2:00 PM [facility] ECHO 3 [id number]  12/09/2015 1:30 PM Leonie Man, MD [facility]  12/31/2015 2:30 PM Unk Pinto, MD [facility] None  06/30/2016 2:00 PM Vicie Mutters, PA-C [facility] None  07/13/2016 10:00 AM [facility] LAB 2 [facility] None  07/13/2016 10:30 AM Chauncey Cruel, MD [facility] None  10/10/2016 2:00 PM Vicie Mutters, PA-C [facility] None     Subjective:  [name] [name] is a 70 y.o. female who presents for CPE and 3 month follow up.   She has had elevated blood pressure for  years. Her blood pressure has been controlled at home, today their BP is BP: 122/74 mmHg She does not workout regularly but does walk with her dog, and just bought new elliptical. She denies chest pain, shortness of breath, dizziness.  She had CP in May, went to the ER and found to have nonischemic cardiomyopathy and atrial fibrillation, EF 25-30%, had normal Cath 08/10/2015, CHADSVASC 2, not started on anticoagulation due to anemia, only on ASA. Following with Dr. Ellyn Hack, will optimize medications and repeat echo in 3 months. She is on the coreg 6.25mg  BID, diovan 40mg , lasix 20mg  . She states she will feel  occ flutter in her chest and  very rare pain but it is rare. Weight is up some, about 4-5 lbs. Some mild ankle swelling in right foot but getting MRI Monday at [facility], no orthopnea, PND, edema.  Wt Readings from Last 3 Encounters:  09/24/15 219 lb (99.338 kg)  09/15/15 218 lb (98.884 kg)  09/02/15 212 lb (96.163 kg)   She had anemia, her H/H is increasing.  Lab Results  Component Value Date   WBC 7.2 09/02/2015   HGB 11.1* 09/02/2015   HCT 34.4* 09/02/2015   MCV 80.8 09/02/2015   PLT 368 09/02/2015   She is on cholesterol medication, lipitor 80mg  1/2 pill every day and denies myalgias. Her cholesterol is at goal. The cholesterol last visit was:   Lab Results  Component Value Date   CHOL 122 08/07/2015   HDL 39* 08/07/2015   LDLCALC 73 08/07/2015   TRIG 52 08/07/2015   CHOLHDL 3.1 08/07/2015   She has been working on diet and exercise for prediabetes,last visit it was in the DM range, will monitor closley , and denies paresthesia of the feet, polydipsia, polyuria and visual disturbances. Last A1C in the office was:  Lab Results  Component Value Date   HGBA1C 6.5* 08/06/2015   Lab Results  Component Value Date   CREATININE 0.66 08/19/2015   BUN 12 08/19/2015   NA 140 08/19/2015   K 4.1 08/19/2015   CL 100 08/19/2015   CO2 28 08/19/2015   Lab Results  Component Value Date   GFRNONAA >89 08/19/2015   Patient is on Vitamin D supplement.   Lab Results  Component Value Date   VD25OH 48 12/30/2014  She follows with Dr. [name] for ortho.  She is on thyroid medication. Her medication was not changed last visit.   Lab Results  Component Value Date   TSH 1.10 06/23/2015  3 years since husband has passed, still has some depression from this, worse around his bday/death date. On cymbalta and wellbturin and handling well.  Follows with Dr. Griffith Citron for left breast cancer, had lumpectomy and radiation in 2015, she is on anastrozole.  BMI is Body mass index is 36.44 kg/(m^2)., she is working on diet and  exercise. Wt Readings from Last 3 Encounters:  09/24/15 219 lb (99.338 kg)  09/15/15 218 lb (98.884 kg)  09/02/15 212 lb (96.163 kg)     Medication Review: Current Outpatient Prescriptions on File Prior to Visit  Medication Sig Dispense Refill  . anastrozole (ARIMIDEX) 1 MG tablet Take 1 tablet (1 mg total) by mouth daily. 90 tablet 3  . aspirin EC 81 MG EC tablet Take 1 tablet (81 mg total) by mouth daily.    Marland Kitchen atorvastatin (LIPITOR) 80 MG tablet Take 0.5 tablets (40 mg total) by mouth daily. 90 tablet 3  . buPROPion (WELLBUTRIN XL) 150 MG 24 hr tablet Take 1 tablet (150 mg total) by mouth every morning. 30 tablet 2  . Calcium Carbonate-Vitamin D (CALTRATE 600+D) 600-400 MG-UNIT per chew tablet Chew 1 tablet by mouth daily. 180 tablet 4  . carvedilol (COREG) 6.25 MG tablet Take 1 tablet (6.25 mg total) by mouth 2 (two) times daily with a meal. 60 tablet 1  . Cholecalciferol (VITAMIN D) 2000 UNITS tablet Take 4,000 Units by mouth daily.    . clotrimazole-betamethasone (LOTRISONE) cream Apply 1 application topically 2 (two) times daily. 15 g 2  . cyclobenzaprine (FLEXERIL) 10 MG tablet Take 10 mg by mouth at bedtime.  1  .  DULoxetine (CYMBALTA) 30 MG capsule Take 1 capsule (30 mg total) by mouth 3 (three) times daily. 270 capsule 3  . ferrous sulfate 325 (65 FE) MG tablet Take 1 tablet (325 mg total) by mouth 2 (two) times daily with a meal. 60 tablet 1  . furosemide (LASIX) 20 MG tablet Take 1 tablet (20 mg total) by mouth 2 (two) times daily. 60 tablet 1  . gabapentin (NEURONTIN) 300 MG capsule Take 1 capsule (300 mg total) by mouth at bedtime. 90 capsule 1  . hyoscyamine (LEVSIN SL) 0.125 MG SL tablet Place 1 tablet (0.125 mg total) under the tongue every 4 (four) hours as needed. 30 tablet 0  . levofloxacin (LEVAQUIN) 750 MG tablet Take 1 tablet (750 mg total) by mouth once. 3 tablet 0  . levothyroxine (SYNTHROID, LEVOTHROID) 50 MCG tablet TAKE 1/2 OR 1 TABLET BY MOUTH EVERY MORNING  30-60 MINS BEFORE BREAKFAST ON AN EMPTY STOMACH 90 tablet 1  . meloxicam (MOBIC) 15 MG tablet Take 15 mg by mouth daily. Take 1/2 to 1 tablet daily with food for pain. *Don't take with Aleve or Ibuprofen, can take Tylenol*    . mometasone (NASONEX) 50 MCG/ACT nasal spray USE 1 TO 2 SPRAYS IN EACH NOSTRIL TWICE DAILY 17 g 99  . ranitidine (ZANTAC) 300 MG tablet TAKE 1 TABLET BY MOUTH TWICE A DAY WHILE TRYING TO GET OFF PPI THEN CAN GO TO ONCE NIGHTLY 180 tablet 1  . sucralfate (CARAFATE) 1 g tablet TAKE 1 TABLET BY MOUTH 4 TIMES A DAY 360 tablet 1  . temazepam (RESTORIL) 30 MG capsule Take 30 mg by mouth at bedtime as needed for sleep.    . traMADol (ULTRAM) 50 MG tablet TAKE 1 TABLET BY MOUTH 4 TIMES A DAY AS NEEDED 120 tablet 0  . valsartan (DIOVAN) 40 MG tablet TAKE 1 TABLET (40 MG TOTAL) BY MOUTH DAILY. 90 tablet 0   No current facility-administered medications on file prior to visit.    Current Problems (verified) Patient Active Problem List   Diagnosis Date Noted  . Congestive dilated cardiomyopathy ([place])   . Systolic CHF, acute ([place]) 08/08/2015  . Atrial fibrillation ([place]) 08/06/2015  . Chest pain with high risk for cardiac etiology - in setting of anemai 08/06/2015  . Dyspnea 08/06/2015  . Hypothyroidism 08/06/2015  . History of ulcer disease 08/06/2015  . Chronic anemia 08/06/2015  . Absolute anemia   . Encounter for Medicare annual wellness exam 03/31/2015  . DDD (degenerative disc disease), lumbar 06/16/2014  . Prediabetes 12/18/2013  . Vitamin D deficiency 12/18/2013  . Medication management 12/18/2013  . Paroxysmal supraventricular tachycardia ([place]) 10/18/2013  . Radiculopathy 10/16/2013  . Obesity (BMI 30-39.9) 06/11/2013  . Cancer of lower-inner quadrant of left female breast ([place]) 05/20/2013  . History of pancreatitis 03/27/2008  . Hyperlipidemia 03/13/2008  . ANXIETY DEPRESSION 03/13/2008  . Essential hypertension 03/13/2008  . H/O left bundle branch block  03/13/2008  . Allergic rhinitis 03/13/2008  . GERD 03/13/2008   Allergies  Allergen Reactions  . Requip [Ropinirole Hcl] Shortness Of Breath and Nausea And Vomiting  . Codeine Other (See Comments)    fatigue  . Minocycline Hives  . Morphine Sulfate Other (See Comments)    Palpitations  . Tetracyclines & Related Hives    Pt doesn't remember a reaction  . Zestril [Lisinopril] Cough  . Zetia [Ezetimibe] Other (See Comments)    Muscle and joint pain  . Amoxicillin Rash    Pt doesn't remember  a reaction  . Sulfa Antibiotics Other (See Comments)    Headache (pt states that a blood pressure medicine caused a severe headache but doesn't remember a reaction to sulfa)   Screening Tests Immunization History  Administered Date(s) Administered  . Influenza Whole 01/01/2013  . Influenza, High Dose Seasonal PF 01/29/2014, 12/30/2014  . Pneumococcal Conjugate-13 06/16/2014  . Pneumococcal Polysaccharide-23 05/23/2011  . Tdap 03/11/2008  . Zoster 02/02/2010   Preventative care: Last colonoscopy: 2014 Last mammogram: 05/2015  (05/06/2013 + right breast cancer s/p lumpectomy) Last pap smear/pelvic exam: remote   DEXA: 12/2013- normal Renal US 08/2013 Echo 2008  Prior vaccinations: TD or Tdap: 2009  Influenza: 2016 Pneumococcal: 2013 Prevnar 13: 2016 Shingles/Zostavax: 2011  Names of Other Physician/Practitioners you currently use: 1. [facility] here for primary care 2. [facility], eye doctor, last visit April 2015 3. Dr. Tye Savoy, dentist, last visit q 6 months Patient Care Team: Unk Pinto, MD as PCP - General (Internal Medicine) [name], MD as Referring Physician (Orthopedic Surgery) Chauncey Cruel, MD as Consulting Physician (Oncology) Inda Castle, MD as Consulting Physician (Gastroenterology)  SURGICAL HISTORY Past Surgical History  Procedure Laterality Date  . Cataract extraction bilateral w/ anterior  vitrectomy Bilateral   . Abdominal hysterectomy    . Cholecystectomy    . Knee arthroscopy Bilateral   . Tonsillectomy    . Colonoscopy  2014  . Upper gi endoscopy    . Breast surgery      lt lump-neg  . Dupuytren / palmar fasciotomy Bilateral 2007    x 3 to left and once to the right   . Tubal ligation    . Foot surgery Right   . Epidural injections      x 3  . Lumbar fusion  10/2013    Dr. Lynann Bologna  . Cardiac catheterization N/A 08/10/2015    Procedure: Right/Left Heart Cath and Coronary Angiography;  Surgeon: Burnell Blanks, MD;  Location: [facility];  Service: Cardiovascular;  Laterality: N/A;   FAMILY HISTORY Family History  Problem Relation Age of Onset  . Colon polyps Neg Hx   . Esophageal cancer Neg Hx   . Rectal cancer Neg Hx   . Stomach cancer Neg Hx   . Heart attack Mother   . Stroke Mother   . COPD Father   . Hypertension Father    SOCIAL HISTORY Social History  Substance Use Topics  . Smoking status: Never Smoker   . Smokeless tobacco: Never Used  . Alcohol Use: Yes     Comment: wine occasionally-rarely     Objective:     Blood pressure 122/74, pulse 87, temperature 97.9 F (36.6 C), temperature source Temporal, resp. rate 16, height 5\' 5"  (1.651 m), weight 219 lb (99.338 kg), SpO2 98 %. Body mass index is 36.44 kg/(m^2).  General appearance: alert, no distress, WD/WN, female HEENT: normocephalic, sclerae anicteric, TMs pearly, nares patent, no discharge or erythema, pharynx normal, + maxillary sinus tenderness to palpation.  Oral cavity: MMM, no lesions Neck: supple, no lymphadenopathy, no thyromegaly, no masses Heart: RRR at this time, normal S1, S2, no murmurs Lungs: CTA bilaterally, no wheezes, rhonchi, or rales Abdomen: +bs, soft, obese, + epigastric tenderness, non distended, no masses, no hepatomegaly, no splenomegaly Musculoskeletal: nontender, no swelling, no obvious deformity Extremities: no edema, no cyanosis, no  clubbing Pulses: 2+ symmetric, upper and lower extremities, normal cap refill Neurological: alert, oriented x 3, CN2-12 intact, strength  normal upper extremities and lower extremities, sensation decreased bilateral feet throughout, DTRs 2+ throughout, no cerebellar signs, gait antalgic Psychiatric: normal affect, behavior normal, pleasant    Vicie Mutters, PA-C   09/24/2015

## 2015-09-25 LAB — URINALYSIS, MICROSCOPIC ONLY
Bacteria, UA: NONE SEEN [HPF]
CASTS: NONE SEEN [LPF]
Crystals: NONE SEEN [HPF]
RBC / HPF: NONE SEEN RBC/HPF (ref <=2)
YEAST: NONE SEEN [HPF]

## 2015-09-25 LAB — TSH: TSH: 1.38 m[IU]/L

## 2015-09-25 LAB — BASIC METABOLIC PANEL WITH GFR
BUN: 12 mg/dL (ref 7–25)
CALCIUM: 9.4 mg/dL (ref 8.6–10.4)
CO2: 27 mmol/L (ref 20–31)
CREATININE: 0.71 mg/dL (ref 0.50–0.99)
Chloride: 102 mmol/L (ref 98–110)
GFR, EST NON AFRICAN AMERICAN: 87 mL/min (ref >=60)
Glucose, Bld: 69 mg/dL (ref 65–99)
Potassium: 3.9 mmol/L (ref 3.5–5.3)
SODIUM: 139 mmol/L (ref 135–146)

## 2015-09-25 LAB — MICROALBUMIN / CREATININE URINE RATIO
CREATININE, URINE: 60 mg/dL (ref 20–320)
MICROALB UR: 0.2 mg/dL
MICROALB/CREAT RATIO: 3 ug/mg{creat} (ref <30)

## 2015-09-25 LAB — URINALYSIS, ROUTINE W REFLEX MICROSCOPIC
Bilirubin Urine: NEGATIVE
Glucose, UA: NEGATIVE
Hgb urine dipstick: NEGATIVE
KETONES UR: NEGATIVE
NITRITE: NEGATIVE
PH: 6 (ref 5.0–8.0)
Protein, ur: NEGATIVE
SPECIFIC GRAVITY, URINE: 1.01 (ref 1.001–1.035)

## 2015-09-25 LAB — FERRITIN: Ferritin: 67 ng/mL (ref 20–288)

## 2015-09-25 LAB — HEPATIC FUNCTION PANEL
ALT: 11 U/L (ref 6–29)
AST: 16 U/L (ref 10–35)
Albumin: 4 g/dL (ref 3.6–5.1)
Alkaline Phosphatase: 121 U/L (ref 33–130)
BILIRUBIN DIRECT: 0.1 mg/dL (ref <=0.2)
BILIRUBIN INDIRECT: 0.2 mg/dL (ref 0.2–1.2)
BILIRUBIN TOTAL: 0.3 mg/dL (ref 0.2–1.2)
Total Protein: 7.1 g/dL (ref 6.1–8.1)

## 2015-09-25 LAB — LIPID PANEL
CHOL/HDL RATIO: 3.4 ratio (ref <=5.0)
CHOLESTEROL: 177 mg/dL (ref 125–200)
HDL: 52 mg/dL (ref >=46)
LDL Cholesterol: 89 mg/dL (ref <130)
Triglycerides: 182 mg/dL — ABNORMAL HIGH (ref <150)
VLDL: 36 mg/dL — AB (ref <30)

## 2015-09-25 LAB — IRON AND TIBC
%SAT: 23 % (ref 11–50)
Iron: 79 ug/dL (ref 45–160)
TIBC: 351 ug/dL (ref 250–450)
UIBC: 272 ug/dL (ref 125–400)

## 2015-09-25 LAB — HEMOGLOBIN A1C
Hgb A1c MFr Bld: 6.3 % — ABNORMAL HIGH (ref <5.7)
MEAN PLASMA GLUCOSE: 134 mg/dL

## 2015-09-25 LAB — VITAMIN B12: VITAMIN B 12: 340 pg/mL (ref 200–1100)

## 2015-09-25 LAB — VITAMIN D 25 HYDROXY (VIT D DEFICIENCY, FRACTURES): Vit D, 25-Hydroxy: 56 ng/mL (ref 30–100)

## 2015-09-25 LAB — MAGNESIUM: Magnesium: 1.8 mg/dL (ref 1.5–2.5)

## 2015-09-28 DIAGNOSIS — M25571 Pain in right ankle and joints of right foot: Secondary | ICD-10-CM | POA: Diagnosis not present

## 2015-10-06 DIAGNOSIS — M25571 Pain in right ankle and joints of right foot: Secondary | ICD-10-CM | POA: Diagnosis not present

## 2015-10-10 HISTORY — PX: TRANSTHORACIC ECHOCARDIOGRAM: SHX275

## 2015-10-12 ENCOUNTER — Other Ambulatory Visit: Payer: Self-pay | Admitting: Internal Medicine

## 2015-10-14 ENCOUNTER — Telehealth: Payer: Self-pay | Admitting: Cardiology

## 2015-10-14 NOTE — Telephone Encounter (Signed)
New message      Calling to check the status of a surgical clearance that was faxed on 10-12-15.  Did you get it?  How soon can it be completed?

## 2015-10-14 NOTE — Telephone Encounter (Signed)
Returned call to [place] with [employer] calling for surgical clearance for pt who is scheduled for right ankle surgery 11/04/15.Message sent to Almyra Deforest who saw pt 09/02/15 for clearance.

## 2015-10-15 ENCOUNTER — Other Ambulatory Visit: Payer: Self-pay | Admitting: *Deleted

## 2015-10-15 MED ORDER — FERROUS SULFATE 325 (65 FE) MG PO TABS: 325.0000 mg | ORAL_TABLET | Freq: Two times a day (BID) | ORAL | Status: DC

## 2015-10-19 ENCOUNTER — Other Ambulatory Visit: Payer: Self-pay

## 2015-10-19 MED ORDER — SUCRALFATE 1 G PO TABS: 1.0000 g | ORAL_TABLET | Freq: Four times a day (QID) | ORAL | Status: DC

## 2015-10-20 NOTE — Telephone Encounter (Signed)
Can you find out the urgency of this surgery, patient is scheduled for a repeat echo to reassess her pericardial fluid follow by office visit with Dr. Ellyn Hack next month. Ideally she should be seen in the clinic with a repeat EKG to make sure PAF has not reccured before clearance, if urgent, will need to move echo up and repeat EKG and reassess volume status.

## 2015-10-20 NOTE — Telephone Encounter (Signed)
Follow up ° ° ° ° ° °Returning a call to the nurse °

## 2015-10-20 NOTE — Telephone Encounter (Signed)
Elmyra Ricks @ [facility] called back. She advises that this surgery is not urgent, that this is just what the pt picked out. I explained to her what all encounters in the pt getting cleared for sx.  I advised her I would contact the pt to let her know that we may not be able to move everything and have it done and her cleared by the 26th of July and that she will have to reschedule. Elmyra Ricks was in agreeance.  lmptcb jw 10/20/15

## 2015-10-22 ENCOUNTER — Ambulatory Visit (HOSPITAL_COMMUNITY): Payer: Medicare Other | Attending: Internal Medicine

## 2015-10-22 ENCOUNTER — Telehealth: Payer: Self-pay | Admitting: Cardiology

## 2015-10-22 DIAGNOSIS — E785 Hyperlipidemia, unspecified: Secondary | ICD-10-CM | POA: Diagnosis not present

## 2015-10-22 DIAGNOSIS — C50919 Malignant neoplasm of unspecified site of unspecified female breast: Secondary | ICD-10-CM | POA: Insufficient documentation

## 2015-10-22 DIAGNOSIS — I447 Left bundle-branch block, unspecified: Secondary | ICD-10-CM | POA: Insufficient documentation

## 2015-10-22 DIAGNOSIS — I429 Cardiomyopathy, unspecified: Secondary | ICD-10-CM | POA: Diagnosis not present

## 2015-10-22 DIAGNOSIS — I428 Other cardiomyopathies: Secondary | ICD-10-CM | POA: Insufficient documentation

## 2015-10-22 DIAGNOSIS — I4891 Unspecified atrial fibrillation: Secondary | ICD-10-CM

## 2015-10-22 DIAGNOSIS — E669 Obesity, unspecified: Secondary | ICD-10-CM | POA: Insufficient documentation

## 2015-10-22 DIAGNOSIS — Z6836 Body mass index (BMI) 36.0-36.9, adult: Secondary | ICD-10-CM | POA: Diagnosis not present

## 2015-10-22 DIAGNOSIS — I255 Ischemic cardiomyopathy: Secondary | ICD-10-CM | POA: Diagnosis present

## 2015-10-22 NOTE — Telephone Encounter (Signed)
Returned pts call.  She was wanting her ECHO results that she just had done today.  Made pt aware that it has not been resulted as of yet, but as soon as I received it, I would call her. Pt agreeable with this plan.

## 2015-10-22 NOTE — Telephone Encounter (Signed)
Advised patient Dr Ellyn Hack out to the office but will forward message to [facility] PA since he was the ordering provider

## 2015-10-22 NOTE — Telephone Encounter (Signed)
New message      Pt had an echo today.  She is leaving to go out of the country Sunday.  She is calling to get the results

## 2015-10-26 ENCOUNTER — Ambulatory Visit: Payer: Medicare Other | Admitting: Cardiology

## 2015-10-26 NOTE — Telephone Encounter (Signed)
I have sent her echo for review by Dr. Ellyn Hack to see if we can go ahead and clear her for surgery. Her echo has significantly improved, pumping function of heart was 25 % in Apr, now it is 45-50% close to lower border of normal. The fluid around the heart has disappeared as well. Dr. Ellyn Hack is in the hospital tomorrow, I'll try to discuss with him about this case

## 2015-10-26 NOTE — Progress Notes (Signed)
[name] [name],  I think we can probably safely based on the recent echocardiogram with improved EF while in sinus rhythm in the normal coronary catheterization done recently that the reduced EF is probably related to left bundle-branch block and was exacerbated by A. fib. Probably does need to be on anticoagulation, but I think we did not do that because of the anemia when she came in. This can be readdressed following up visits. I think as far as surgery goes, ankle surgery is a low risk surgery. She does not have renal insufficiency, diabetes, history of stroke or active anginal symptoms. Did however have recent CHF. Therefore he would still probably be considered to be low risk for this low risk surgery.   Glenetta Hew, M.D., M.S. Interventional Cardiologist   Pager # [phone] Phone # [phone] [address] [place], [place] [address]

## 2015-10-28 ENCOUNTER — Telehealth: Payer: Self-pay | Admitting: *Deleted

## 2015-10-28 NOTE — Telephone Encounter (Signed)
-----   Message from [place], [place] sent at 10/28/2015  2:16 PM EDT ----- Regarding: FW: question Discussed with Dr. Ellyn Hack, she looked good on last visit. Her echo has significantly improved. If she continue to feel ok, we will clear her for surgery and she can see Dr. Ellyn Hack later.   Hilbert Corrigan PA Pager: [phone]   ----- Message -----    From: Leonie Man, MD    Sent: 10/27/2015   3:49 PM      To: Almyra Deforest, PA Subject: RE: question                                   Not up she was looking fine then.  DH ----- Message -----    From: Almyra Deforest, PA    Sent: 10/27/2015   9:56 AM      To: Leonie Man, MD Subject: question                                       i sent her echo for you to review last week. Her EF has significant improved, the pericardial effusion has resolved. She looked euvolemic near end of May when I saw her, does she need another visit before preop clearance or do I just clear her for ankle surgery? Not sure about the standard procedure here.   Hilbert Corrigan PA Pager: [phone]

## 2015-10-28 NOTE — Telephone Encounter (Signed)
lmptcb jw7/18/17

## 2015-11-02 NOTE — Telephone Encounter (Signed)
Follow up ° ° ° ° ° °Returning a call to the nurse °

## 2015-11-03 NOTE — Telephone Encounter (Signed)
Returned call to pt to advise her that per Dr. Ellyn Hack, if she is feeling ok, he will clear her for surgery and she can just f/u with him afterwards.  Pt states that she is feeling good and she appreciated the clearance.  Fwd note to Dr. Dorna Leitz.  Pt verbalized understanding.

## 2015-11-10 ENCOUNTER — Ambulatory Visit (INDEPENDENT_AMBULATORY_CARE_PROVIDER_SITE_OTHER): Payer: Medicare Other | Admitting: Internal Medicine

## 2015-11-10 ENCOUNTER — Encounter: Payer: Self-pay | Admitting: Internal Medicine

## 2015-11-10 VITALS — BP 130/74 | HR 98 | Temp 98.9°F | Resp 16 | Ht 65.0 in | Wt 216.2 lb

## 2015-11-10 DIAGNOSIS — I480 Paroxysmal atrial fibrillation: Secondary | ICD-10-CM | POA: Diagnosis not present

## 2015-11-10 DIAGNOSIS — B360 Pityriasis versicolor: Secondary | ICD-10-CM | POA: Diagnosis not present

## 2015-11-10 MED ORDER — TEMAZEPAM 30 MG PO CAPS: 30.0000 mg | ORAL_CAPSULE | Freq: Every evening | ORAL | 3 refills | Status: DC | PRN

## 2015-11-10 MED ORDER — KETOCONAZOLE 2 % EX SHAM: MEDICATED_SHAMPOO | Freq: Once | CUTANEOUS | Status: DC

## 2015-11-10 NOTE — Progress Notes (Signed)
   Subjective:    Patient ID: [name] [name], female    DOB: August 06, 1945, 70 y.o.   MRN: [id number]  HPI  Patient reports to the office for evaluation of jitters and palpitations.  She reports that she has been feeling like this for a week.  She reports that she has had some trouble with sleeping.  She does have occasional sharp pain that starts the fluttering.  She reports that she does have some shortness of breath a little bit.  She is not drinking any caffeine.  She has not been drinking alcohol.  No OTC medication changes.      Review of Systems  Constitutional: Negative for chills, fatigue and fever.  Respiratory: Negative for chest tightness and shortness of breath.   Cardiovascular: Positive for chest pain and palpitations. Negative for leg swelling.  Gastrointestinal: Negative for abdominal pain, constipation, diarrhea, nausea and vomiting.  Neurological: Negative for tremors.  Psychiatric/Behavioral: Positive for sleep disturbance. The patient is nervous/anxious.        Objective:   Physical Exam  Constitutional: She is oriented to person, place, and time. She appears well-developed and well-nourished. No distress.  HENT:  Head: Normocephalic.  Mouth/Throat: Oropharynx is clear and moist. No oropharyngeal exudate.  Eyes: Conjunctivae are normal. No scleral icterus.  Neck: Normal range of motion. Neck supple. No JVD present. No thyromegaly present.  Cardiovascular: Normal rate, regular rhythm, normal heart sounds and intact distal pulses.  Exam reveals no gallop and no friction rub.   No murmur heard. Pulmonary/Chest: Effort normal and breath sounds normal. No respiratory distress. She has no wheezes. She has no rales. She exhibits no tenderness.  Musculoskeletal: Normal range of motion.  Lymphadenopathy:    She has no cervical adenopathy.  Neurological: She is alert and oriented to person, place, and time.  Skin: Skin is warm and dry. Rash noted. She is not diaphoretic.   Flat macular pale brown patches to the right abdomen with mild flaking and dryness.  No erythema, excoriation, petechia, or purpura.    Psychiatric: She has a normal mood and affect. Her behavior is normal. Judgment and thought content normal.  Nursing note and vitals reviewed.   Vitals:   11/10/15 1459  BP: 130/74  Pulse: 98  Resp: 16  Temp: 98.9 F (37.2 C)          Assessment & Plan:    1. Tinea versicolor  - ketoconazole (NIZORAL) 2 % shampoo; Apply topically once.  2. Paroxysmal atrial fibrillation (HCC) -EKG in NSR with no visible LBBB which was previously seen.  Rate is faster than anticipated at 98 on coreg twice daily.  Recommend that with upcoming surgery that she see Dr. Ellyn Hack prior to surgery.   - TSH - CBC with Diff - Basic metabolic panel

## 2015-11-11 ENCOUNTER — Other Ambulatory Visit: Payer: Self-pay | Admitting: Internal Medicine

## 2015-11-11 LAB — CBC WITH DIFFERENTIAL/PLATELET
BASOS PCT: 1 %
Basophils Absolute: 78 cells/uL (ref 0–200)
Eosinophils Absolute: 156 cells/uL (ref 15–500)
Eosinophils Relative: 2 %
HEMATOCRIT: 34.5 % — AB (ref 35.0–45.0)
HEMOGLOBIN: 11.1 g/dL — AB (ref 11.7–15.5)
LYMPHS ABS: 1716 {cells}/uL (ref 850–3900)
Lymphocytes Relative: 22 %
MCH: 27.7 pg (ref 27.0–33.0)
MCHC: 32.2 g/dL (ref 32.0–36.0)
MCV: 86 fL (ref 80.0–100.0)
MONO ABS: 702 {cells}/uL (ref 200–950)
MPV: 10.1 fL (ref 7.5–12.5)
Monocytes Relative: 9 %
NEUTROS PCT: 66 %
Neutro Abs: 5148 cells/uL (ref 1500–7800)
Platelets: 322 10*3/uL (ref 140–400)
RBC: 4.01 MIL/uL (ref 3.80–5.10)
RDW: 18.7 % — ABNORMAL HIGH (ref 11.0–15.0)
WBC: 7.8 10*3/uL (ref 3.8–10.8)

## 2015-11-11 LAB — BASIC METABOLIC PANEL
BUN: 12 mg/dL (ref 7–25)
CHLORIDE: 102 mmol/L (ref 98–110)
CO2: 29 mmol/L (ref 20–31)
Calcium: 9.8 mg/dL (ref 8.6–10.4)
Creat: 1.03 mg/dL — ABNORMAL HIGH (ref 0.50–0.99)
GLUCOSE: 101 mg/dL — AB (ref 65–99)
POTASSIUM: 4.5 mmol/L (ref 3.5–5.3)
Sodium: 141 mmol/L (ref 135–146)

## 2015-11-11 LAB — TSH: TSH: 1.89 mIU/L

## 2015-11-11 MED ORDER — KETOCONAZOLE 2 % EX SHAM
1.0000 | MEDICATED_SHAMPOO | CUTANEOUS | 0 refills | Status: DC
Start: 2015-11-12 — End: 2016-05-10

## 2015-11-16 ENCOUNTER — Other Ambulatory Visit: Payer: Self-pay | Admitting: *Deleted

## 2015-11-16 MED ORDER — CARVEDILOL 12.5 MG PO TABS: 18.7500 mg | ORAL_TABLET | Freq: Two times a day (BID) | ORAL | 2 refills | Status: DC

## 2015-11-16 NOTE — Addendum Note (Signed)
Addended by: Ayumi Wangerin A on: 11/16/2015 12:34 PM   Modules accepted: Orders

## 2015-11-18 ENCOUNTER — Other Ambulatory Visit: Payer: Self-pay | Admitting: Internal Medicine

## 2015-11-23 ENCOUNTER — Other Ambulatory Visit (HOSPITAL_COMMUNITY): Payer: Medicare Other

## 2015-11-25 DIAGNOSIS — M7671 Peroneal tendinitis, right leg: Secondary | ICD-10-CM | POA: Diagnosis not present

## 2015-11-25 DIAGNOSIS — G8918 Other acute postprocedural pain: Secondary | ICD-10-CM | POA: Diagnosis not present

## 2015-11-25 DIAGNOSIS — M94261 Chondromalacia, right knee: Secondary | ICD-10-CM | POA: Diagnosis not present

## 2015-11-25 DIAGNOSIS — M66361 Spontaneous rupture of flexor tendons, right lower leg: Secondary | ICD-10-CM | POA: Diagnosis not present

## 2015-12-05 ENCOUNTER — Other Ambulatory Visit: Payer: Self-pay | Admitting: Internal Medicine

## 2015-12-09 ENCOUNTER — Encounter: Payer: Self-pay | Admitting: Cardiology

## 2015-12-09 ENCOUNTER — Ambulatory Visit (INDEPENDENT_AMBULATORY_CARE_PROVIDER_SITE_OTHER): Payer: Medicare Other | Admitting: Cardiology

## 2015-12-09 VITALS — BP 148/87 | HR 92 | Ht 65.5 in | Wt 214.0 lb

## 2015-12-09 DIAGNOSIS — I480 Paroxysmal atrial fibrillation: Secondary | ICD-10-CM

## 2015-12-09 DIAGNOSIS — Z79899 Other long term (current) drug therapy: Secondary | ICD-10-CM | POA: Diagnosis not present

## 2015-12-09 DIAGNOSIS — I471 Supraventricular tachycardia: Secondary | ICD-10-CM

## 2015-12-09 DIAGNOSIS — E782 Mixed hyperlipidemia: Secondary | ICD-10-CM

## 2015-12-09 DIAGNOSIS — I429 Cardiomyopathy, unspecified: Secondary | ICD-10-CM | POA: Diagnosis not present

## 2015-12-09 DIAGNOSIS — I428 Other cardiomyopathies: Secondary | ICD-10-CM

## 2015-12-09 DIAGNOSIS — I10 Essential (primary) hypertension: Secondary | ICD-10-CM | POA: Diagnosis not present

## 2015-12-09 DIAGNOSIS — Z8679 Personal history of other diseases of the circulatory system: Secondary | ICD-10-CM

## 2015-12-09 DIAGNOSIS — I5042 Chronic combined systolic (congestive) and diastolic (congestive) heart failure: Secondary | ICD-10-CM

## 2015-12-09 MED ORDER — VALSARTAN 40 MG PO TABS: 40.0000 mg | ORAL_TABLET | Freq: Every day | ORAL | 3 refills | Status: DC

## 2015-12-09 MED ORDER — APIXABAN 5 MG PO TABS
5.0000 mg | ORAL_TABLET | Freq: Two times a day (BID) | ORAL | 0 refills | Status: DC
Start: 2015-12-09 — End: 2016-05-10

## 2015-12-09 MED ORDER — APIXABAN 5 MG PO TABS: 5.0000 mg | ORAL_TABLET | Freq: Two times a day (BID) | ORAL | 3 refills | Status: DC

## 2015-12-09 NOTE — Progress Notes (Signed)
PCP: Alesia Richards, MD  Clinic Note: Chief Complaint  Patient presents with  . Follow-up    Pt states fluttering in chest. Pt states no other Sx or concerns.   . Atrial Fibrillation  . Cardiomyopathy    HPI: [name] [name] is a 70 y.o. female with a PMH below who presents today for 3 month follow-up for nonischemic cardiomyopathy. She has a history of LBBB dating back 2007 as well as breast cancer On Arimidex, hyperlipidemia and thyroid disease.  She was admitted on 08/06/2015 after presented to her PCPs office with complaint of chest pain. Symptoms are concerning for angina and with reduced EF on echo, she underwent cardiac catheterization as well.  Echocardiogram obtained on 07/30/2015 showed EF 25-30%, grade 2 DD, paradoxical septal wall motion abnormality, PA peak pressure 34 mmHg, small to moderate free-flowing pericardial effusion circumferential to the heart. There was no tamponade symptoms or physiology.   She was also noted to be in new atrial fibrillation, however quickly converted to sinus rhythm.   cardiac catheterization on 08/10/2015 which showed normal filling pressures, no angiographic evidence of CAD, nonischemic cardiomyopathy. Her verapamil was stopped.   She does have CHA2DS2-Vasc score 4 (age, female, HTN, HF), she was not started ont anticoagulation upon discharge given significant anemia and she was discharged on aspirin. It appears she was transfused one unit of packed red blood cells during the last admission. Her hemoglobin did go down as low as 7.4 during the admission. Hospitalist service as discussed with GI service who is currently not planning for any endoscopy or colonoscopy.   [name] [name] was last seen in May by Mr. Eulas Post on 09/02/2015. Her hemoglobin was noted to be increased. She was doing relatively well without any significant heart failure symptoms. She had a repeat echocardiogram ordered for the end of July which showed significant  improvement of her EF almost back to baseline. Likely her cardiac myopathy was related to A. fib with LBBB.  Recent Hospitalizations: None  Studies Reviewed:   Procedures 08/10/2015:  Right/Left Heart Cath and Coronary Angiography 1. Non-ischemic cardiomyopathy 2. No angiographic evidence of CAD 3. Normal filling pressures.     Echo 10/2015 (EF up from 25-30%): - Left ventricle: Poor acoustic windows -  difficult to see.    EF estimated at 45-50% with inferoseptal and possible posterior hypokinesis. GR 1 DD. No pericardial effusion.  Interval History: [name] [name] presents today actually doing fairly well. She thinks that her recent hospitalization back in April may have been triggered by a lot of stress and an ongoing illness. She states that about 3 year anniversary of her husband died.  She denies any recurrence of rapid irregular heartbeats, but still has every now and then a few episodes of fluttering in her chest. She describes as a butterfly sensation that lasts maybe a few seconds. They really seem to stop after we increased her carvedilol up to 18.75 twice a day. She denies having any blood in her stool, Dr. stools or bloody urine. GI decided not to perform endoscopy and her hemoglobin is stabilized.  Since she last saw someone in our office, she has not had any episodes of chest tightness or pressure with rest or exertion. No heart failure symptoms of PND, orthopnea or edema.  No  lightheadedness, dizziness, weakness or syncope/near syncope. No TIA/amaurosis fugax symptoms. No melena, hematochezia, hematuria, or epstaxis. No claudication.  ROS: A comprehensive was performed. Review of Systems  Constitutional: Negative for malaise/fatigue.  HENT: Negative  for congestion and nosebleeds.   Respiratory: Negative for cough, shortness of breath and wheezing.   Cardiovascular: Negative.        Per history of present illness  Gastrointestinal: Negative for abdominal pain, constipation,  diarrhea and heartburn.  Neurological: Negative for dizziness and headaches.  Endo/Heme/Allergies: Does not bruise/bleed easily.  Psychiatric/Behavioral: Negative for memory loss. The patient is nervous/anxious. The patient does not have insomnia.   All other systems reviewed and are negative.   Past Medical History:  Diagnosis Date  . Allergy    Nasonex daily as needed  . Anemia   . Anxiety   . Arthritis   . 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 Chronic back pain    cyst sitting on L4-5;slipped disc  . Depression    takes Cymbalta daily  . Diverticulosis   . GERD (gastroesophageal reflux disease)    takes Omeprazole daily  . Headache(784.0)    rare  . History of gastric ulcer at age 27  . History of shingles   . Hyperlipidemia    takes Atorvastatin 3 times a week  . Hypertension    takes Cardura nightly and Verapamil daily  . Insomnia    takes Restoril nightly  . Joint pain   . Nonischemic cardiomyopathy ([place]) 07/2015   Normal coronary arteries by cath. EF was 25-30% by echo. GR2 DD - likely related to LBBB in setting of A. fib  . Paroxysmal atrial fibrillation ([place]): CHA2DS2-VASc Score - starting Eliquis 08/06/2015   This patients CHA2DS2-VASc Score and unadjusted Ischemic Stroke Rate (% per year) is equal to 4.8 % stroke rate/year from a score of 4  Above score calculated as 1 point each if present [CHF, HTN, DM, Vascular=MI/PAD/Aortic Plaque, Age if 65-74, or Female]; 2 points each if present [Age > 75, or Stroke/TIA/TE]  . PONV (postoperative nausea and vomiting)   . S/P radiation therapy 07/18/2013-08/28/2013   1) Left Breast / 50 Gy in 25 fractions/ 2) Left Breast Boost / 10 Gy in 5 fractions  . Shingles   . Sinus drainage    put on Levaquin yesterday if no better in 3 days will start prednisone  . Thyroid disease   . Weakness    tingling and numbness both hands and left leg  . Wears glasses     Past Surgical  History:  Procedure Laterality Date  . ABDOMINAL HYSTERECTOMY    . BREAST SURGERY     lt lump-neg  . CARDIAC CATHETERIZATION N/A 08/10/2015   Procedure: Right/Left Heart Cath and Coronary Angiography;  Surgeon: Burnell Blanks, MD;  Location: [facility];  Service: Cardiovascular;: Nonobstructive CAD  . CATARACT EXTRACTION BILATERAL W/ ANTERIOR VITRECTOMY Bilateral   . CHOLECYSTECTOMY    . COLONOSCOPY  2014  . DUPUYTREN / PALMAR FASCIOTOMY Bilateral 2007   x 3 to left and once to the right   . epidural injections     x 3  . FOOT SURGERY Right   . KNEE ARTHROSCOPY Bilateral   . LUMBAR FUSION  10/2013   Dr. Lynann Bologna  . TONSILLECTOMY    . TRANSTHORACIC ECHOCARDIOGRAM  08/07/2015   Severely reduced LVEF at 25-30% with diffuse HK. GR 2 DD. Ventricular dyssynergy secondary to LBBB. Small to moderate pericardial effusion but no hemodynamic compromise  . TRANSTHORACIC ECHOCARDIOGRAM  10/2015    (EF up from 25-30%): - Left ventricle: Poor acoustic windows -  difficult to see.  EF estimated at 45-50% with inferoseptal and possible posterior hypokinesis. GR 1 DD. No pericardial effusion.  . TUBAL LIGATION    . UPPER GI ENDOSCOPY      Prior to Admission medications   Medication Sig Start Date End Date Taking? Authorizing Provider  anastrozole (ARIMIDEX) 1 MG tablet Take 1 tablet (1 mg total) by mouth daily. 07/13/15  Yes Chauncey Cruel, MD  aspirin EC 81 MG EC tablet Take 1 tablet (81 mg total) by mouth daily. 08/11/15  Yes Theodis Blaze, MD  atorvastatin (LIPITOR) 80 MG tablet Take 0.5 tablets (40 mg total) by mouth daily. 08/11/15  Yes Theodis Blaze, MD  buPROPion (WELLBUTRIN XL) 150 MG 24 hr tablet Take 1 tablet (150 mg total) by mouth every morning. 08/19/15 08/18/16 Yes Courtney Forcucci, PA-C  Calcium Carbonate-Vitamin D (CALTRATE 600+D) 600-400 MG-UNIT per chew tablet Chew 1 tablet by mouth daily. 01/13/14  Yes Chauncey Cruel, MD  carvedilol (COREG) 12.5 MG tablet Take 1.5  tablets (18.75 mg total) by mouth 2 (two) times daily with a meal. 11/16/15  Yes Courtney Forcucci, PA-C  Cholecalciferol (VITAMIN D) 2000 UNITS tablet Take 4,000 Units by mouth daily.   Yes Historical Provider, MD  clotrimazole-betamethasone (LOTRISONE) cream Apply 1 application topically 2 (two) times daily. 09/15/15  Yes Vicie Mutters, PA-C  cyclobenzaprine (FLEXERIL) 10 MG tablet Take 10 mg by mouth at bedtime. 09/06/14  Yes Historical Provider, MD  DULoxetine (CYMBALTA) 30 MG capsule Take 1 capsule (30 mg total) by mouth 3 (three) times daily. 06/23/15  Yes Vicie Mutters, PA-C  ferrous sulfate 325 (65 FE) MG tablet Take 1 tablet (325 mg total) by mouth 2 (two) times daily with a meal. 10/15/15  Yes Unk Pinto, MD  furosemide (LASIX) 20 MG tablet TAKE 1 TABLET BY MOUTH TWICE A DAY 10/12/15  Yes Unk Pinto, MD  gabapentin (NEURONTIN) 300 MG capsule Take 1 capsule (300 mg total) by mouth at bedtime. 06/23/15  Yes Vicie Mutters, PA-C  hyoscyamine (LEVSIN SL) 0.125 MG SL tablet Place 1 tablet (0.125 mg total) under the tongue every 4 (four) hours as needed. 08/19/15  Yes Courtney Forcucci, PA-C  ketoconazole (NIZORAL) 2 % shampoo Apply 1 application topically 2 (two) times a week. 11/12/15  Yes Courtney Forcucci, PA-C  levothyroxine (SYNTHROID, LEVOTHROID) 50 MCG tablet TAKE 1/2 OR 1 TABLET BY MOUTH EVERY MORNING 30-60 MINS BEFORE BREAKFAST ON AN EMPTY STOMACH 07/29/15  Yes Unk Pinto, MD  meloxicam (MOBIC) 15 MG tablet Take 15 mg by mouth daily. Take 1/2 to 1 tablet daily with food for pain. *Don't take with Aleve or Ibuprofen, can take Tylenol*   Yes Historical Provider, MD  meloxicam (MOBIC) 15 MG tablet TAKE 1/2 TO 1 TABLET DAILY WITH FOOD FOR PAIN *DONT TAKE WITH ALEVE OR IBUPROFEN CAN TAKE TYLENOL* 11/18/15  Yes Unk Pinto, MD  mometasone (NASONEX) 50 MCG/ACT nasal spray USE 1 TO 2 SPRAYS IN EACH NOSTRIL TWICE DAILY 06/23/15  Yes Vicie Mutters, PA-C  ranitidine (ZANTAC) 300 MG tablet TAKE 1  TABLET BY MOUTH TWICE A DAY WHILE TRYING TO GET OFF PPI THEN CAN GO TO ONCE NIGHTLY 06/23/15  Yes Vicie Mutters, PA-C  sucralfate (CARAFATE) 1 g tablet Take 1 tablet (1 g total) by mouth 4 (four) times daily. 10/19/15  Yes Unk Pinto, MD  temazepam (RESTORIL) 30 MG capsule Take 1 capsule (30 mg total) by mouth at bedtime as needed for sleep. 11/10/15 12/10/15 Yes Courtney Forcucci, PA-C  traMADol (ULTRAM) 50  MG tablet TAKE 1 TABLET BY MOUTH 4 TIMES A DAY AS NEEDED 09/01/15  Yes Unk Pinto, MD  valsartan (DIOVAN) 40 MG tablet TAKE 1 TABLET (40 MG TOTAL) BY MOUTH DAILY. 12/05/15  Yes Unk Pinto, MD    Allergies  Allergen Reactions  . Requip [Ropinirole Hcl] Shortness Of Breath and Nausea And Vomiting  . Codeine Other (See Comments)    fatigue  . Minocycline Hives  . Morphine Sulfate Other (See Comments)    Palpitations  . Tetracyclines & Related Hives    Pt doesn't remember a reaction  . Zestril [Lisinopril] Cough  . Zetia [Ezetimibe] Other (See Comments)    Muscle and joint pain  . Amoxicillin Rash    Pt doesn't remember a reaction  . Sulfa Antibiotics Other (See Comments)    Headache (pt states that a blood pressure medicine caused a severe headache but doesn't remember a reaction to sulfa)    Social History   Social History  . Marital status: Widowed    Spouse name: N/A  . Number of children: N/A  . Years of education: N/A   Social History Main Topics  . Smoking status: Never Smoker  . Smokeless tobacco: Never Used  . Alcohol use Yes     Comment: wine occasionally-rarely  . Drug use: No  . Sexual activity: Not Asked   Other Topics Concern  . None   Social History Narrative  . None    Family History  Problem Relation Age of Onset  . Heart attack Mother   . Stroke Mother   . COPD Father   . Hypertension Father   . Colon polyps Neg Hx   . Esophageal cancer Neg Hx   . Rectal cancer Neg Hx   . Stomach cancer Neg Hx     Wt Readings from Last 3  Encounters:  12/09/15 214 lb (97.1 kg)  11/10/15 216 lb 3.2 oz (98.1 kg)  09/24/15 219 lb (99.3 kg)    PHYSICAL EXAM BP (!) 148/87   Pulse 92   Ht 5' 5.5" (1.664 m)   Wt 214 lb (97.1 kg)   BMI 35.07 kg/m  General appearance: alert, cooperative, appears stated age, no distress and Moderately obese. Pleasant mood and affect. Neck: no adenopathy, no carotid bruit and no JVD HEENT: [place]/AT, EOMI, MMM, anicteric sclera Lungs: clear to auscultation bilaterally, normal percussion bilaterally and non-labored Heart: regular rate and rhythm, S1 & S2 normal, no murmur, click, rub or gallop ; nondisplaced PMI Abdomen: soft, non-tender; bowel sounds normal; no masses,  no organomegaly; no HJR Extremities: extremities normal, atraumatic, no cyanosis, or edema  Pulses: 2+ and symmetric Skin: mobility and turgor normal, no edema and no evidence of bleeding or bruising  Neurologic: Mental status: Alert, oriented, thought content appropriate   Adult ECG Report Not checked   Other studies Reviewed: Additional studies/ records that were reviewed today include:  Recent Labs:   Lab Results  Component Value Date   HGB 11.1 (L) 11/10/2015   Lab Results  Component Value Date   CREATININE 1.03 (H) 11/10/2015   Lab Results  Component Value Date   K 4.5 11/10/2015   Lab Results  Component Value Date   CHOL 177 09/24/2015   HDL 52 09/24/2015   LDLCALC 89 09/24/2015   TRIG 182 (H) 09/24/2015   CHOLHDL 3.4 09/24/2015     This patients CHA2DS2-VASc Score and unadjusted Ischemic Stroke Rate (% per year) is equal to 4.8 % stroke rate/year from a  score of 4  Above score calculated as 1 point each if present [CHF, HTN, DM, Vascular=MI/PAD/Aortic Plaque, Age if 32-74, or Female]; 2 points each if present [Age > 75, or Stroke/TIA/TE]   ASSESSMENT / PLAN: Problem List Items Addressed This Visit    Paroxysmal supraventricular tachycardia ([place])    I think that she may very well have PAF as well as  PSVT. Maybe her short little flutter episodes were PSVT and not PAF. Regardless there well-controlled with current dose of beta blocker.      Relevant Medications   apixaban (ELIQUIS) 5 MG TABS tablet   valsartan (DIOVAN) 40 MG tablet   apixaban (ELIQUIS) 5 MG TABS tablet   Other Relevant Orders   CBC   Paroxysmal atrial fibrillation ([place]): CHA2DS2-VASc Score - starting Eliquis (Chronic)    Hemoglobin now stable. Seems to be maintaining sinus rhythm with increased dose of beta blocker. No longer on calcium channel blocker because of her reduced EF. Plan now will be to start Eliquis and follow her hemoglobin closely. Spectra anemia may been related to her Arimidex. We can probably stop aspirin.      Relevant Medications   apixaban (ELIQUIS) 5 MG TABS tablet   valsartan (DIOVAN) 40 MG tablet   apixaban (ELIQUIS) 5 MG TABS tablet   Non-ischemic cardiomyopathy ([facility]) (Chronic)    Probably related to her underlying left bundle branch block complicated by A. fib. Her EF is almost back to normal now and she's not having heart failure symptoms. However, she is susceptible for recurrence if she were to go back into A. fib. She is really taking low-dose Lasix once a day and occasionally when necessary. Not taking it twice a day now. She good dose of beta blocker which we recently increased. She still has blood pressure room for increasing afterload reduction with valsartan. We will increase to 80 mg daily.      Relevant Medications   apixaban (ELIQUIS) 5 MG TABS tablet   valsartan (DIOVAN) 40 MG tablet   apixaban (ELIQUIS) 5 MG TABS tablet   Other Relevant Orders   CBC   Medication dose changed   Relevant Orders   CBC   Hyperlipidemia (Chronic)    She is on atorvastatin 40 mg daily. Labs are followed by PCP.      Relevant Medications   apixaban (ELIQUIS) 5 MG TABS tablet   valsartan (DIOVAN) 40 MG tablet   apixaban (ELIQUIS) 5 MG TABS tablet   H/O left bundle branch block (Chronic)     This is probably due to some of her wall motion abnormality and therefore reduced ejection fraction. The combination of LBBB plus atrial fibrillation with RVR probably triggered her exacerbated EF.      Essential hypertension - Primary (Chronic)    Not adequately controlled, especially with reduced EF and diastolic dysfunction that is easily exacerbated by A. fib. Plan will be to increase Diovan to 80 mg daily. On good dose of carvedilol.      Relevant Medications   apixaban (ELIQUIS) 5 MG TABS tablet   valsartan (DIOVAN) 40 MG tablet   apixaban (ELIQUIS) 5 MG TABS tablet   Other Relevant Orders   CBC   Chronic combined systolic and diastolic heart failure, NYHA class 2 (HCC) -- exacerbated by A. fib (Chronic)    No active heart failure symptoms and improved EF once A. fib was treated. She is on good stable regimen now with carvedilol and now increased dose of valsartan. She is on  low-dose diuretic. Main factor he will be to try to maintain sinus rhythm. If she were to have recurrence of A. fib, would potentially consider antiarrhythmics.      Relevant Medications   apixaban (ELIQUIS) 5 MG TABS tablet   valsartan (DIOVAN) 40 MG tablet   apixaban (ELIQUIS) 5 MG TABS tablet    Other Visit Diagnoses   None.     Current medicines are reviewed at length with the patient today. (+/- concerns) n/a The following changes have been made:  [place] (VALSARTAN ) 80 MG ONE TABLET DAILY START ELIQUIS  5 MG ONE TABLET TWICE A DAY ( 2 PRESCRIPTIONS SENT TO PHARMACY)  IN ONE MONTH PLEASE HAVE LABS DONe- CBC ( to check hemoglobin)  Your physician wants you to follow-up in: Jan 2018 with Dr Ellyn Hack   Studies Ordered:   Orders Placed This Encounter  Procedures  . CBC      Glenetta Hew, M.D., M.S. Interventional Cardiologist   Pager # [phone] Phone # [phone] [address] [place], [place] [address]

## 2015-12-09 NOTE — Patient Instructions (Addendum)
CHANGE IN MEDICATION - INCREASE DIOVAN (VALSARTAN ) 40 MG ONE TABLET DAILY START ELIQUIS  5 MG ONE TABLET TWICE A DAY ( 2 PRESCRIPTIONS SENT TO PHARMACY)  IN ONE MONTH PLEASE HAVE LABS DONe- CBC ( to check hemoglobin)  Your physician wants you to follow-up in: Jan 2018 with Dr Melody Haver will receive a reminder letter in the mail two months in advance. If you don't receive a letter, please call our office to schedule the follow-up appointment.  If you need a refill on your cardiac medications before your next appointment, please call your pharmacy.

## 2015-12-11 ENCOUNTER — Encounter: Payer: Self-pay | Admitting: Cardiology

## 2015-12-11 ENCOUNTER — Other Ambulatory Visit: Payer: Self-pay | Admitting: Internal Medicine

## 2015-12-11 NOTE — Assessment & Plan Note (Signed)
She is on atorvastatin 40 mg daily. Labs are followed by PCP.

## 2015-12-11 NOTE — Assessment & Plan Note (Signed)
This is probably due to some of her wall motion abnormality and therefore reduced ejection fraction. The combination of LBBB plus atrial fibrillation with RVR probably triggered her exacerbated EF.

## 2015-12-11 NOTE — Assessment & Plan Note (Signed)
I think that she may very well have PAF as well as PSVT. Maybe her short little flutter episodes were PSVT and not PAF. Regardless there well-controlled with current dose of beta blocker.

## 2015-12-11 NOTE — Assessment & Plan Note (Signed)
Hemoglobin now stable. Seems to be maintaining sinus rhythm with increased dose of beta blocker. No longer on calcium channel blocker because of her reduced EF. Plan now will be to start Eliquis and follow her hemoglobin closely. Spectra anemia may been related to her Arimidex. We can probably stop aspirin.

## 2015-12-11 NOTE — Assessment & Plan Note (Signed)
Not adequately controlled, especially with reduced EF and diastolic dysfunction that is easily exacerbated by A. fib. Plan will be to increase Diovan to 80 mg daily. On good dose of carvedilol.

## 2015-12-11 NOTE — Assessment & Plan Note (Signed)
No active heart failure symptoms and improved EF once A. fib was treated. She is on good stable regimen now with carvedilol and now increased dose of valsartan. She is on low-dose diuretic. Main factor he will be to try to maintain sinus rhythm. If she were to have recurrence of A. fib, would potentially consider antiarrhythmics.

## 2015-12-11 NOTE — Assessment & Plan Note (Signed)
Probably related to her underlying left bundle branch block complicated by A. fib. Her EF is almost back to normal now and she's not having heart failure symptoms. However, she is susceptible for recurrence if she were to go back into A. fib. She is really taking low-dose Lasix once a day and occasionally when necessary. Not taking it twice a day now. She good dose of beta blocker which we recently increased. She still has blood pressure room for increasing afterload reduction with valsartan. We will increase to 80 mg daily.

## 2015-12-12 ENCOUNTER — Other Ambulatory Visit: Payer: Self-pay | Admitting: Internal Medicine

## 2015-12-24 ENCOUNTER — Telehealth: Payer: Self-pay | Admitting: *Deleted

## 2015-12-24 MED ORDER — VALSARTAN 80 MG PO TABS: 80.0000 mg | ORAL_TABLET | Freq: Every day | ORAL | 3 refills | Status: DC

## 2015-12-24 NOTE — Telephone Encounter (Signed)
Spoke to patient. Aware of the increase of valsartan 80 mg-one tablet daily. E-sent a new prescription

## 2015-12-24 NOTE — Telephone Encounter (Signed)
-----   Message from Leonie Man, MD sent at 12/11/2015 10:52 PM EDT ----- Regarding: Error in Valsartan dose Ivin Booty, I think the plan was to increase her valsartan from 40-80 mg.  She was on 40 mg, and I want to increase to 80 mg daily.  Our instructions for her wrong.  Glenetta Hew, MD

## 2015-12-31 ENCOUNTER — Encounter: Payer: Self-pay | Admitting: Internal Medicine

## 2015-12-31 ENCOUNTER — Ambulatory Visit (INDEPENDENT_AMBULATORY_CARE_PROVIDER_SITE_OTHER): Payer: Medicare Other | Admitting: Internal Medicine

## 2015-12-31 VITALS — BP 126/72 | HR 68 | Temp 97.0°F | Resp 16 | Ht 65.5 in

## 2015-12-31 DIAGNOSIS — I4891 Unspecified atrial fibrillation: Secondary | ICD-10-CM

## 2015-12-31 DIAGNOSIS — E559 Vitamin D deficiency, unspecified: Secondary | ICD-10-CM | POA: Diagnosis not present

## 2015-12-31 DIAGNOSIS — E038 Other specified hypothyroidism: Secondary | ICD-10-CM

## 2015-12-31 DIAGNOSIS — I10 Essential (primary) hypertension: Secondary | ICD-10-CM | POA: Diagnosis not present

## 2015-12-31 DIAGNOSIS — R7303 Prediabetes: Secondary | ICD-10-CM

## 2015-12-31 DIAGNOSIS — Z79899 Other long term (current) drug therapy: Secondary | ICD-10-CM | POA: Diagnosis not present

## 2015-12-31 DIAGNOSIS — E782 Mixed hyperlipidemia: Secondary | ICD-10-CM | POA: Diagnosis not present

## 2015-12-31 LAB — HEPATIC FUNCTION PANEL
ALBUMIN: 4.2 g/dL (ref 3.6–5.1)
ALK PHOS: 133 U/L — AB (ref 33–130)
ALT: 13 U/L (ref 6–29)
AST: 15 U/L (ref 10–35)
BILIRUBIN TOTAL: 0.4 mg/dL (ref 0.2–1.2)
Bilirubin, Direct: 0.1 mg/dL (ref <=0.2)
Indirect Bilirubin: 0.3 mg/dL (ref 0.2–1.2)
Total Protein: 7 g/dL (ref 6.1–8.1)

## 2015-12-31 LAB — LIPID PANEL
CHOL/HDL RATIO: 4 ratio (ref <=5.0)
CHOLESTEROL: 202 mg/dL — AB (ref 125–200)
HDL: 50 mg/dL (ref >=46)
LDL Cholesterol: 119 mg/dL (ref <130)
Triglycerides: 167 mg/dL — ABNORMAL HIGH (ref <150)
VLDL: 33 mg/dL — AB (ref <30)

## 2015-12-31 LAB — MAGNESIUM: Magnesium: 2 mg/dL (ref 1.5–2.5)

## 2015-12-31 LAB — TSH: TSH: 1.35 mIU/L

## 2015-12-31 NOTE — Patient Instructions (Signed)

## 2015-12-31 NOTE — Progress Notes (Signed)
[name] Unk Pinto, M.D.        Uvaldo Bristle. Silverio Lay, P.A.-C       Starlyn Skeans, P.A.-C  [facility]                [address] [place], [place] [id number] Telephone [phone] Telefax [phone] ______________________________________________________________________     This very nice 70 y.o.female presents for follow up with Hypertension, Hyperlipidemia, Pre-Diabetes and Vitamin D Deficiency.      Patient is treated for HTN & BP has been controlled at home.  In April this year , patient had an episode of pAfib and 2DEC showed low EF 25-30% and subsequent heart cath foung Nl coronary arteries and more recent [place] in July show improved EF to 45-50%.   Patient has had no complaints of any cardiac type chest pain, palpitations, dyspnea/orthopnea/PND, dizziness, claudication, or dependent edema.  Today's BP is 126/72.     Hyperlipidemia is controlled with diet & meds. Patient denies myalgias or other med SE's. Last Lipids were at goal: Lab Results  Component Value Date   CHOL 177 09/24/2015   HDL 52 09/24/2015   LDLCALC 89 09/24/2015   TRIG 182 (H) 09/24/2015   CHOLHDL 3.4 09/24/2015      Also, the patient has history of PreDiabetes and has had no symptoms of reactive hypoglycemia, diabetic polys, paresthesias or visual blurring.  Last A1c was not at goal:   Lab Results  Component Value Date   HGBA1C 6.3 (H) 09/24/2015      Also, patient was started on Thyroid Replacement in March 2016.  Further, the patient also has history of Vitamin D Deficiency of "37"in 2016 and supplements vitamin D without any suspected side-effects. Last vitamin D was improved:  Lab Results  Component Value Date   VD25OH 56 09/24/2015   Current Outpatient Prescriptions on File Prior to Visit  Medication Sig  . anastrozole (ARIMIDEX) 1 MG tablet Take 1 tablet (1 mg total) by mouth daily.  Marland Kitchen apixaban (ELIQUIS) 5 MG  TABS tablet Take 1 tablet (5 mg total) by mouth 2 (two) times daily.  Marland Kitchen aspirin EC 81 MG EC tablet Take 1 tablet (81 mg total) by mouth daily.  Marland Kitchen atorvastatin (LIPITOR) 80 MG tablet Take 0.5 tablets (40 mg total) by mouth daily.  Marland Kitchen buPROPion (WELLBUTRIN XL) 150 MG 24 hr tablet TAKE 1 TABLET BY MOUTH EVERY MORNING  . Calcium Carbonate-Vitamin D (CALTRATE 600+D) 600-400 MG-UNIT per chew tablet Chew 1 tablet by mouth daily.  . carvedilol (COREG) 12.5 MG tablet Take 1.5 tablets (18.75 mg total) by mouth 2 (two) times daily with a meal.  . Cholecalciferol (VITAMIN D) 2000 UNITS tablet Take 4,000 Units by mouth daily.  . clotrimazole-betamethasone (LOTRISONE) cream Apply 1 application topically 2 (two) times daily.  . cyclobenzaprine (FLEXERIL) 10 MG tablet Take 10 mg by mouth at bedtime.  . DULoxetine (CYMBALTA) 30 MG capsule Take 1 capsule (30 mg total) by mouth 3 (three) times daily.  . ferrous sulfate 325 (65 FE) MG tablet Take 1 tablet (325 mg total) by mouth 2 (two) times daily with a meal.  . furosemide (LASIX) 20 MG tablet TAKE 1 TABLET BY MOUTH TWICE A DAY  . gabapentin (NEURONTIN) 300 MG capsule Take 1 capsule (300 mg total) by mouth at bedtime.  . gabapentin (NEURONTIN) 300 MG capsule TAKE ONE CAPSULE BY  MOUTH AT BEDTIME  . hyoscyamine (LEVSIN SL) 0.125 MG SL tablet Place 1 tablet (0.125 mg total) under the tongue every 4 (four) hours as needed.  Marland Kitchen ketoconazole (NIZORAL) 2 % shampoo Apply 1 application topically 2 (two) times a week.  . Levothyroxine 50 MCG tablet TAKE 1/2 OR 1 TABLET BY MOUTH EVERY MORNING 30-60 MINS BEFORE BREAKFAST ON AN EMPTY STOMACH  . meloxicam (MOBIC) 15 MG tablet Take 15 mg by mouth daily. Take 1/2 to 1 tablet daily with food for pain. *Don't take with Aleve or Ibuprofen, can take Tylenol*  . meloxicam (MOBIC) 15 MG tablet TAKE 1/2 TO 1 TABLET DAILY WITH FOOD FOR PAIN *DONT TAKE WITH ALEVE OR IBUPROFEN CAN TAKE TYLENOL*  . mometasone (NASONEX) 50 MCG/ACT nasal spray  USE 1 TO 2 SPRAYS IN EACH NOSTRIL TWICE DAILY  . ranitidine (ZANTAC) 300 MG tablet TAKE 1 TABLET BY MOUTH TWICE A DAY WHILE TRYING TO GET OFF PPI THEN CAN GO TO ONCE NIGHTLY  . sucralfate (CARAFATE) 1 g tablet Take 1 tablet (1 g total) by mouth 4 (four) times daily.  . traMADol (ULTRAM) 50 MG tablet TAKE 1 TABLET BY MOUTH 4 TIMES A DAY AS NEEDED  . valsartan (DIOVAN) 80 MG tablet Take 1 tablet (80 mg total) by mouth daily.  . temazepam (RESTORIL) 30 MG capsule Take 1 capsule (30 mg total) by mouth at bedtime as needed for sleep.   No current facility-administered medications on file prior to visit.    Allergies  Allergen Reactions  . Requip [Ropinirole Hcl] Shortness Of Breath and Nausea And Vomiting  . Codeine Other (See Comments)    fatigue  . Minocycline Hives  . Morphine Sulfate Other (See Comments)    Palpitations  . Tetracyclines & Related Hives    Pt doesn't remember a reaction  . Zestril [Lisinopril] Cough  . Zetia [Ezetimibe] Other (See Comments)    Muscle and joint pain  . Amoxicillin Rash    Pt doesn't remember a reaction  . Sulfa Antibiotics Other (See Comments)    Headache (pt states that a blood pressure medicine caused a severe headache but doesn't remember a reaction to sulfa)   PMHx:   Past Medical History:  Diagnosis Date  . Allergy    Nasonex daily as needed  . Anemia   . Anxiety   . Arthritis   . Breast cancer ([place])     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  . Chronic back pain    cyst sitting on L4-5;slipped disc  . Depression    takes Cymbalta daily  . Diverticulosis   . GERD (gastroesophageal reflux disease)    takes Omeprazole daily  . Headache(784.0)    rare  . History of gastric ulcer at age 31  . History of shingles   . Hyperlipidemia    takes Atorvastatin 3 times a week  . Hypertension    takes Cardura nightly and Verapamil daily  . Insomnia    takes Restoril nightly  . Joint pain   . Nonischemic cardiomyopathy  ([place]) 07/2015   Normal coronary arteries by cath. EF was 25-30% by echo. GR2 DD - likely related to LBBB in setting of A. fib  . Paroxysmal atrial fibrillation [facility] [place] [facility]): CHA2DS2-VASc Score - starting Eliquis 08/06/2015   This patients CHA2DS2-VASc Score and unadjusted Ischemic Stroke Rate (% per year) is equal to 4.8 % stroke rate/year from a score of 4  Above score calculated as 1 point each if present [  CHF, HTN, DM, Vascular=MI/PAD/Aortic Plaque, Age if 26-74, or Female]; 2 points each if present [Age > 75, or Stroke/TIA/TE]  . PONV (postoperative nausea and vomiting)   . S/P radiation therapy 07/18/2013-08/28/2013   1) Left Breast / 50 Gy in 25 fractions/ 2) Left Breast Boost / 10 Gy in 5 fractions  . Shingles   . Sinus drainage    put on Levaquin yesterday if no better in 3 days will start prednisone  . Thyroid disease   . Weakness    tingling and numbness both hands and left leg  . Wears glasses    Immunization History  Administered Date(s) Administered  . Influenza Whole 01/01/2013  . Influenza, High Dose Seasonal PF 01/29/2014, 12/30/2014  . Influenza-Unspecified 12/11/2015  . Pneumococcal Conjugate-13 06/16/2014  . Pneumococcal Polysaccharide-23 05/23/2011  . Tdap 03/11/2008  . Zoster 02/02/2010   Past Surgical History:  Procedure Laterality Date  . ABDOMINAL HYSTERECTOMY    . BREAST SURGERY     lt lump-neg  . CARDIAC CATHETERIZATION N/A 08/10/2015   Procedure: Right/Left Heart Cath and Coronary Angiography;  Surgeon: Burnell Blanks, MD;  Location: [facility];  Service: Cardiovascular;: Nonobstructive CAD  . CATARACT EXTRACTION BILATERAL W/ ANTERIOR VITRECTOMY Bilateral   . CHOLECYSTECTOMY    . COLONOSCOPY  2014  . DUPUYTREN / PALMAR FASCIOTOMY Bilateral 2007   x 3 to left and once to the right   . epidural injections     x 3  . FOOT SURGERY Right   . KNEE ARTHROSCOPY Bilateral   . LUMBAR FUSION  10/2013   Dr. Lynann Bologna  . TONSILLECTOMY    . TRANSTHORACIC  ECHOCARDIOGRAM  08/07/2015   Severely reduced LVEF at 25-30% with diffuse HK. GR 2 DD. Ventricular dyssynergy secondary to LBBB. Small to moderate pericardial effusion but no hemodynamic compromise  . TRANSTHORACIC ECHOCARDIOGRAM  10/2015    (EF up from 25-30%): - Left ventricle: Poor acoustic windows -  difficult to see.    EF estimated at 45-50% with inferoseptal and possible posterior hypokinesis. GR 1 DD. No pericardial effusion.  . TUBAL LIGATION    . UPPER GI ENDOSCOPY     FHx:    Reviewed / unchanged  SHx:    Reviewed / unchanged  Systems Review:  Constitutional: Denies fever, chills, wt changes, headaches, insomnia, fatigue, night sweats, change in appetite. Eyes: Denies redness, blurred vision, diplopia, discharge, itchy, watery eyes.  ENT: Denies discharge, congestion, post nasal drip, epistaxis, sore throat, earache, hearing loss, dental pain, tinnitus, vertigo, sinus pain, snoring.  CV: Denies chest pain, palpitations, irregular heartbeat, syncope, dyspnea, diaphoresis, orthopnea, PND, claudication or edema. Respiratory: denies cough, dyspnea, DOE, pleurisy, hoarseness, laryngitis, wheezing.  Gastrointestinal: Denies dysphagia, odynophagia, heartburn, reflux, water brash, abdominal pain or cramps, nausea, vomiting, bloating, diarrhea, constipation, hematemesis, melena, hematochezia  or hemorrhoids. Genitourinary: Denies dysuria, frequency, urgency, nocturia, hesitancy, discharge, hematuria or flank pain. Musculoskeletal: Denies arthralgias, myalgias, stiffness, jt. swelling, pain, limping or strain/sprain.  Skin: Denies pruritus, rash, hives, warts, acne, eczema or change in skin lesion(s). Neuro: No weakness, tremor, incoordination, spasms, paresthesia or pain. Psychiatric: Denies confusion, memory loss or sensory loss. Endo: Denies change in weight, skin or hair change.  Heme/Lymph: No excessive bleeding, bruising or enlarged lymph nodes.  Physical Exam BP 126/72   Pulse 68    Temp 97 F (36.1 C)   Resp 16   Ht 5' 5.5" (1.664 m)   Appears well nourished and in no distress.  Eyes: PERRLA, EOMs,  conjunctiva no swelling or erythema. Sinuses: No frontal/maxillary tenderness ENT/Mouth: EAC's clear, TM's nl w/o erythema, bulging. Nares clear w/o erythema, swelling, exudates. Oropharynx clear without erythema or exudates. Oral hygiene is good. Tongue normal, non obstructing. Hearing intact.  Neck: Supple. Thyroid nl. Car 2+/2+ without bruits, nodes or JVD. Chest: Respirations nl with BS clear & equal w/o rales, rhonchi, wheezing or stridor.  Cor: Heart sounds normal w/ regular rate and rhythm without sig. murmurs, gallops, clicks, or rubs. Peripheral pulses normal and equal  without edema.  Abdomen: Soft & bowel sounds normal. Non-tender w/o guarding, rebound, hernias, masses, or organomegaly.  Lymphatics: Unremarkable.  Musculoskeletal: Full ROM all peripheral extremities, joint stability, 5/5 strength, and normal gait.  Skin: Warm, dry without exposed rashes, lesions or ecchymosis apparent.  Neuro: Cranial nerves intact, reflexes equal bilaterally. Sensory-motor testing grossly intact. Tendon reflexes grossly intact.  Pysch: Alert & oriented x 3.  Insight and judgement nl & appropriate. No ideations.  Assessment and Plan:  1. Essential hypertension  - Continue medication, monitor blood pressure at home. Continue DASH diet. Reminder to go to the ER if any CP, SOB, nausea, dizziness, severe HA, changes vision/speech, left arm numbness and tingling and jaw pain. - TSH  2. Hyperlipidemia  - Continue diet/meds, exercise,& lifestyle modifications. Continue monitor periodic cholesterol/liver & renal functions  - Lipid panel - TSH  3. Prediabetes  - Continue diet, exercise, lifestyle modifications. Monitor appropriate labs. - Hemoglobin A1c - Insulin, random  4. Vitamin D deficiency  - Continue supplementation. - VITAMIN D 25 Hydroxy   5. Other specified  hypothyroidism   6. Atrial fibrillation, unspecified type ([place])   7. Medication management  - Hepatic function panel - Magnesium      Recommended regular exercise, BP monitoring, weight control, and discussed med and SE's. Recommended labs to assess and monitor clinical status. Further disposition pending results of labs. Over 30 minutes of exam, counseling, chart review was performed

## 2016-01-01 LAB — HEMOGLOBIN A1C
Hgb A1c MFr Bld: 5.8 % — ABNORMAL HIGH (ref <5.7)
MEAN PLASMA GLUCOSE: 120 mg/dL

## 2016-01-01 LAB — INSULIN, RANDOM: Insulin: 22.6 u[IU]/mL — ABNORMAL HIGH (ref 2.0–19.6)

## 2016-01-01 LAB — VITAMIN D 25 HYDROXY (VIT D DEFICIENCY, FRACTURES): Vit D, 25-Hydroxy: 49 ng/mL (ref 30–100)

## 2016-01-04 ENCOUNTER — Other Ambulatory Visit: Payer: Self-pay | Admitting: *Deleted

## 2016-01-04 MED ORDER — CARVEDILOL 12.5 MG PO TABS: ORAL_TABLET | ORAL | 0 refills | Status: DC

## 2016-01-04 MED ORDER — BUPROPION HCL ER (XL) 150 MG PO TB24: 150.0000 mg | ORAL_TABLET | Freq: Every morning | ORAL | 0 refills | Status: DC

## 2016-01-06 ENCOUNTER — Other Ambulatory Visit: Payer: Self-pay | Admitting: Physician Assistant

## 2016-02-03 ENCOUNTER — Other Ambulatory Visit: Payer: Self-pay | Admitting: Internal Medicine

## 2016-02-04 LAB — CBC
HEMATOCRIT: 35.6 % (ref 35.0–45.0)
HEMOGLOBIN: 11.7 g/dL (ref 11.7–15.5)
MCH: 29.9 pg (ref 27.0–33.0)
MCHC: 32.9 g/dL (ref 32.0–36.0)
MCV: 91 fL (ref 80.0–100.0)
MPV: 10.7 fL (ref 7.5–12.5)
Platelets: 353 10*3/uL (ref 140–400)
RBC: 3.91 MIL/uL (ref 3.80–5.10)
RDW: 14 % (ref 11.0–15.0)
WBC: 11.4 10*3/uL — ABNORMAL HIGH (ref 3.8–10.8)

## 2016-02-05 ENCOUNTER — Other Ambulatory Visit: Payer: Self-pay | Admitting: Internal Medicine

## 2016-02-11 ENCOUNTER — Other Ambulatory Visit: Payer: Self-pay | Admitting: Internal Medicine

## 2016-02-12 ENCOUNTER — Other Ambulatory Visit: Payer: Self-pay | Admitting: Internal Medicine

## 2016-03-22 ENCOUNTER — Other Ambulatory Visit: Payer: Self-pay | Admitting: Physician Assistant

## 2016-03-22 ENCOUNTER — Telehealth: Payer: Self-pay

## 2016-03-22 ENCOUNTER — Encounter: Payer: Self-pay | Admitting: Physician Assistant

## 2016-03-22 MED ORDER — PREDNISONE 20 MG PO TABS: ORAL_TABLET | ORAL | 0 refills | Status: DC

## 2016-03-22 NOTE — Telephone Encounter (Signed)
LVM informing pt that the provider has sent her a MyChart message on an OTC med or Rx that would be called into her pharmacy.

## 2016-03-31 ENCOUNTER — Ambulatory Visit: Payer: Self-pay | Admitting: Internal Medicine

## 2016-03-31 ENCOUNTER — Other Ambulatory Visit: Payer: Self-pay | Admitting: Internal Medicine

## 2016-04-08 ENCOUNTER — Other Ambulatory Visit: Payer: Self-pay | Admitting: Internal Medicine

## 2016-04-12 ENCOUNTER — Other Ambulatory Visit: Payer: Self-pay | Admitting: Internal Medicine

## 2016-04-19 ENCOUNTER — Other Ambulatory Visit: Payer: Self-pay | Admitting: Oncology

## 2016-04-19 DIAGNOSIS — Z853 Personal history of malignant neoplasm of breast: Secondary | ICD-10-CM

## 2016-04-28 ENCOUNTER — Ambulatory Visit: Payer: Self-pay | Admitting: Internal Medicine

## 2016-04-29 ENCOUNTER — Other Ambulatory Visit: Payer: Self-pay | Admitting: Internal Medicine

## 2016-05-10 ENCOUNTER — Encounter: Payer: Self-pay | Admitting: Internal Medicine

## 2016-05-10 ENCOUNTER — Ambulatory Visit (INDEPENDENT_AMBULATORY_CARE_PROVIDER_SITE_OTHER): Payer: Medicare Other | Admitting: Internal Medicine

## 2016-05-10 VITALS — BP 126/70 | HR 94 | Temp 98.2°F | Resp 16 | Ht 65.5 in | Wt 227.0 lb

## 2016-05-10 DIAGNOSIS — I428 Other cardiomyopathies: Secondary | ICD-10-CM | POA: Diagnosis not present

## 2016-05-10 DIAGNOSIS — Z79899 Other long term (current) drug therapy: Secondary | ICD-10-CM | POA: Diagnosis not present

## 2016-05-10 DIAGNOSIS — K219 Gastro-esophageal reflux disease without esophagitis: Secondary | ICD-10-CM | POA: Diagnosis not present

## 2016-05-10 DIAGNOSIS — E038 Other specified hypothyroidism: Secondary | ICD-10-CM

## 2016-05-10 DIAGNOSIS — E559 Vitamin D deficiency, unspecified: Secondary | ICD-10-CM

## 2016-05-10 DIAGNOSIS — R7303 Prediabetes: Secondary | ICD-10-CM

## 2016-05-10 DIAGNOSIS — I5042 Chronic combined systolic (congestive) and diastolic (congestive) heart failure: Secondary | ICD-10-CM | POA: Diagnosis not present

## 2016-05-10 DIAGNOSIS — E782 Mixed hyperlipidemia: Secondary | ICD-10-CM | POA: Diagnosis not present

## 2016-05-10 DIAGNOSIS — M1712 Unilateral primary osteoarthritis, left knee: Secondary | ICD-10-CM

## 2016-05-10 LAB — CBC WITH DIFFERENTIAL/PLATELET
BASOS ABS: 72 {cells}/uL (ref 0–200)
BASOS PCT: 1 %
EOS PCT: 3 %
Eosinophils Absolute: 216 cells/uL (ref 15–500)
HCT: 33 % — ABNORMAL LOW (ref 35.0–45.0)
HEMOGLOBIN: 10.9 g/dL — AB (ref 11.7–15.5)
LYMPHS ABS: 1656 {cells}/uL (ref 850–3900)
Lymphocytes Relative: 23 %
MCH: 29.4 pg (ref 27.0–33.0)
MCHC: 33 g/dL (ref 32.0–36.0)
MCV: 88.9 fL (ref 80.0–100.0)
MONOS PCT: 9 %
MPV: 10.8 fL (ref 7.5–12.5)
Monocytes Absolute: 648 cells/uL (ref 200–950)
NEUTROS ABS: 4608 {cells}/uL (ref 1500–7800)
Neutrophils Relative %: 64 %
PLATELETS: 307 10*3/uL (ref 140–400)
RBC: 3.71 MIL/uL — ABNORMAL LOW (ref 3.80–5.10)
RDW: 13.8 % (ref 11.0–15.0)
WBC: 7.2 10*3/uL (ref 3.8–10.8)

## 2016-05-10 LAB — BASIC METABOLIC PANEL WITH GFR
BUN: 19 mg/dL (ref 7–25)
CHLORIDE: 103 mmol/L (ref 98–110)
CO2: 25 mmol/L (ref 20–31)
Calcium: 9.2 mg/dL (ref 8.6–10.4)
Creat: 0.92 mg/dL (ref 0.60–0.93)
GFR, EST NON AFRICAN AMERICAN: 63 mL/min (ref >=60)
GFR, Est African American: 73 mL/min (ref >=60)
Glucose, Bld: 127 mg/dL — ABNORMAL HIGH (ref 65–99)
POTASSIUM: 3.7 mmol/L (ref 3.5–5.3)
SODIUM: 139 mmol/L (ref 135–146)

## 2016-05-10 LAB — LIPID PANEL
CHOL/HDL RATIO: 3.9 ratio (ref <5.0)
CHOLESTEROL: 175 mg/dL (ref <200)
HDL: 45 mg/dL — ABNORMAL LOW (ref >50)
LDL CALC: 94 mg/dL (ref <100)
Triglycerides: 179 mg/dL — ABNORMAL HIGH (ref <150)
VLDL: 36 mg/dL — AB (ref <30)

## 2016-05-10 LAB — TSH: TSH: 0.88 m[IU]/L

## 2016-05-10 LAB — HEPATIC FUNCTION PANEL
ALK PHOS: 122 U/L (ref 33–130)
ALT: 11 U/L (ref 6–29)
AST: 16 U/L (ref 10–35)
Albumin: 4 g/dL (ref 3.6–5.1)
BILIRUBIN DIRECT: 0.1 mg/dL (ref <=0.2)
BILIRUBIN INDIRECT: 0.3 mg/dL (ref 0.2–1.2)
Total Bilirubin: 0.4 mg/dL (ref 0.2–1.2)
Total Protein: 6.7 g/dL (ref 6.1–8.1)

## 2016-05-10 MED ORDER — IPRATROPIUM BROMIDE 0.03 % NA SOLN: 2.0000 | Freq: Three times a day (TID) | NASAL | 2 refills | Status: DC

## 2016-05-10 NOTE — Progress Notes (Signed)
Patient ID: [name] [name], female   DOB: [birth year], 71 y.o.   MRN: [id number]  Assessment and Plan:  Hypertension:  -Continue medication -monitor blood pressure at home. -Continue DASH diet -Reminder to go to the ER if any CP, SOB, nausea, dizziness, severe HA, changes vision/speech, left arm numbness and tingling and jaw pain.  CHF with decreased EF -last echo was back to baseline nearly. -has follow-up with Dr. Ellyn Hack next week -cont ARB -cont beta blocker -arguably can potentially go up on beta blocker  Long Term anticoagulation -CHADSVasc 4 -cont eliquis -no bleeding issues  Cholesterol - Continue diet and exercise -Check cholesterol.   Prediabetes without complications  -123456 level shows that she has been doing well with diet control previously will defer this lab today -Continue diet and exercise.  -Check A1C  Vitamin D Def -check level -continue medications.   Hypothyroidism -cont levothyroxine -TSH  OA of left knee -cont mobic -followed by Dr. [name]  Continue diet and meds as discussed. Further disposition pending results of labs. Discussed med's effects and SE's.    HPI 71 y.o. 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  Marland Kitchen   Her blood pressure has been controlled at home, today their BP is  .She does workout. She denies chest pain, shortness of breath, dizziness.  She does occasionally get some fluttering of her chest, but denies any dizziness, SOB, CP with it.  She is due to see Dr. Ellyn Hack next week for her cardiology checkup.  She is still on eliquis and is long term anticoagulated.  She has had no issues with melena, hematochezia, or nosebleeds.   She is on cholesterol medication and denies myalgias. Her cholesterol is not at goal. The cholesterol was:  12/31/2015: Cholesterol 202; HDL 50; LDL Cholesterol 119; Triglycerides  167   She has been working on diet and exercise for diabetes without complications, she is on bASA, she is on ACE/ARB, and denies  foot ulcerations, hyperglycemia, hypoglycemia , increased appetite, nausea, paresthesia of the feet, polydipsia, polyuria, visual disturbances, vomiting and weight loss. Last A1C was: 12/31/2015: Hgb A1c MFr Bld 5.8   Patient is on Vitamin D supplement. 12/31/2015: Vit D, 25-Hydroxy 49  She reports that she is having some issues at one time with stomach pain in the epigastric region.  She reports that everything that she was eating was bothering her.  She has a history of reflux.  This came shortly after having to take prednisone for a cold.  She reports that this has since resolved.  She is following with Dr. [name] who is doing injections in her left knee.  She is considering getting a knee replacement but needs clearance from Dr. Ellyn Hack.    Current Medications:  Current Outpatient Prescriptions on File Prior to Visit  Medication Sig Dispense Refill  . anastrozole (ARIMIDEX) 1 MG tablet Take 1 tablet (1 mg total) by mouth daily. 90 tablet 3  . apixaban (ELIQUIS) 5 MG TABS tablet Take 1 tablet (5 mg total) by mouth 2 (two) times daily. 180 tablet 3  . apixaban (ELIQUIS) 5 MG TABS tablet Take 1 tablet (5 mg total) by mouth 2 (two) times daily. 60 tablet 0  . aspirin EC 81 MG EC tablet Take 1 tablet (81 mg total) by mouth daily.    Marland Kitchen atorvastatin (LIPITOR) 80 MG tablet Take 0.5 tablets (40 mg total) by mouth daily. [place]  tablet 3  . buPROPion (WELLBUTRIN XL) 150 MG 24 hr tablet TAKE 1 TABLET (150 MG TOTAL) BY MOUTH EVERY MORNING. 90 tablet 0  . Calcium Carbonate-Vitamin D (CALTRATE 600+D) 600-400 MG-UNIT per chew tablet Chew 1 tablet by mouth daily. 180 tablet 4  . carvedilol (COREG) 12.5 MG tablet TAKE 1 TABLET 2 TIMES DAILY OR AS DIRECTED. 180 tablet 0  . Cholecalciferol (VITAMIN D) 2000 UNITS tablet Take 4,000 Units by mouth daily.    . clotrimazole-betamethasone  (LOTRISONE) cream Apply 1 application topically 2 (two) times daily. 15 g 2  . cyclobenzaprine (FLEXERIL) 10 MG tablet Take 10 mg by mouth at bedtime.  1  . DULoxetine (CYMBALTA) 30 MG capsule Take 1 capsule (30 mg total) by mouth 3 (three) times daily. 270 capsule 3  . DULoxetine (CYMBALTA) 30 MG capsule TAKE 1 CAPSULE BY MOUTH 3 TIMES DAILY 270 capsule 3  . ferrous sulfate 325 (65 FE) MG tablet TAKE 1 TABLET (325 MG TOTAL) BY MOUTH 2 (TWO) TIMES DAILY WITH A MEAL. 180 tablet 3  . furosemide (LASIX) 20 MG tablet TAKE 1 TABLET BY MOUTH TWICE A DAY 180 tablet 1  . gabapentin (NEURONTIN) 300 MG capsule Take 1 capsule (300 mg total) by mouth at bedtime. 90 capsule 1  . gabapentin (NEURONTIN) 300 MG capsule TAKE ONE CAPSULE BY MOUTH AT BEDTIME 90 capsule 1  . hyoscyamine (LEVSIN SL) 0.125 MG SL tablet Place 1 tablet (0.125 mg total) under the tongue every 4 (four) hours as needed. 30 tablet 0  . ketoconazole (NIZORAL) 2 % shampoo Apply 1 application topically 2 (two) times a week. 120 mL 0  . levothyroxine (SYNTHROID, LEVOTHROID) 50 MCG tablet TAKE 1/2 TO 1 TABLET ON AN EMPTY STOMACH EVERY MORNING 30 TO 60 MIN BEFORE BREAKFAST 90 tablet 1  . meloxicam (MOBIC) 15 MG tablet Take 15 mg by mouth daily. Take 1/2 to 1 tablet daily with food for pain. *Don't take with Aleve or Ibuprofen, can take Tylenol*    . meloxicam (MOBIC) 15 MG tablet TAKE 1/2 TO 1 TABLET DAILY WITH FOOD FOR PAIN *DONT TAKE WITH ALEVE OR IBUPROFEN CAN TAKE TYLENOL* 90 tablet 1  . mometasone (NASONEX) 50 MCG/ACT nasal spray USE 1 TO 2 SPRAYS IN EACH NOSTRIL TWICE DAILY 17 g 99  . predniSONE (DELTASONE) 20 MG tablet 2 tablets daily for 3 days, 1 tablet daily for 4 days. 10 tablet 0  . ranitidine (ZANTAC) 300 MG tablet TAKE 1 TO 2 TABLETS DAILY AS NEEDED FOR ACID INDIGESTION & REFLUX 180 tablet 1  . sucralfate (CARAFATE) 1 g tablet Take 1 tablet (1 g total) by mouth 4 (four) times daily. 360 tablet 1  . temazepam (RESTORIL) 30 MG capsule  TAKE ONE CAPSULE BY MOUTH AT BEDTIME AS NEEDED FOR SLEEP 90 capsule 1  . traMADol (ULTRAM) 50 MG tablet TAKE 1 TABLET BY MOUTH 4 TIMES A DAY AS NEEDED 120 tablet 0  . valsartan (DIOVAN) 80 MG tablet Take 1 tablet (80 mg total) by mouth daily. 90 tablet 3   No current facility-administered medications on file prior to visit.    Medical History:  Past Medical History:  Diagnosis Date  . Allergy    Nasonex daily as needed  . Anemia   . Anxiety   . Arthritis   . Breast cancer ([place])     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  . Chronic back pain    cyst sitting on L4-5;slipped disc  .  Depression    takes Cymbalta daily  . Diverticulosis   . GERD (gastroesophageal reflux disease)    takes Omeprazole daily  . Headache(784.0)    rare  . History of gastric ulcer at age 70  . History of shingles   . Hyperlipidemia    takes Atorvastatin 3 times a week  . Hypertension    takes Cardura nightly and Verapamil daily  . Insomnia    takes Restoril nightly  . Joint pain   . Nonischemic cardiomyopathy ([place]) 07/2015   Normal coronary arteries by cath. EF was 25-30% by echo. GR2 DD - likely related to LBBB in setting of A. fib  . Paroxysmal atrial fibrillation ([place]): CHA2DS2-VASc Score - starting Eliquis 08/06/2015   This patients CHA2DS2-VASc Score and unadjusted Ischemic Stroke Rate (% per year) is equal to 4.8 % stroke rate/year from a score of 4  Above score calculated as 1 point each if present [CHF, HTN, DM, Vascular=MI/PAD/Aortic Plaque, Age if 65-74, or Female]; 2 points each if present [Age > 75, or Stroke/TIA/TE]  . PONV (postoperative nausea and vomiting)   . S/P radiation therapy 07/18/2013-08/28/2013   1) Left Breast / 50 Gy in 25 fractions/ 2) Left Breast Boost / 10 Gy in 5 fractions  . Shingles   . Sinus drainage    put on Levaquin yesterday if no better in 3 days will start prednisone  . Thyroid disease   . Weakness    tingling and numbness both hands and  left leg  . Wears glasses    Allergies:  Allergies  Allergen Reactions  . Requip [Ropinirole Hcl] Shortness Of Breath and Nausea And Vomiting  . Codeine Other (See Comments)    fatigue  . Minocycline Hives  . Morphine Sulfate Other (See Comments)    Palpitations  . Tetracyclines & Related Hives    Pt doesn't remember a reaction  . Zestril [Lisinopril] Cough  . Zetia [Ezetimibe] Other (See Comments)    Muscle and joint pain  . Amoxicillin Rash    Pt doesn't remember a reaction  . Sulfa Antibiotics Other (See Comments)    Headache (pt states that a blood pressure medicine caused a severe headache but doesn't remember a reaction to sulfa)     Review of Systems:  Review of Systems  Constitutional: Negative for chills, fever and malaise/fatigue.  HENT: Negative for congestion, ear pain and sore throat.   Eyes: Negative.   Respiratory: Negative for cough, shortness of breath and wheezing.   Cardiovascular: Positive for palpitations. Negative for chest pain and leg swelling.  Gastrointestinal: Negative for abdominal pain, blood in stool, constipation, diarrhea, heartburn and melena.  Genitourinary: Negative.   Musculoskeletal: Positive for joint pain.  Skin: Negative.   Neurological: Negative for dizziness, sensory change, loss of consciousness and headaches.  Psychiatric/Behavioral: Negative for depression. The patient is not nervous/anxious and does not have insomnia.     Family history- Review and unchanged  Social history- Review and unchanged  Physical Exam: There were no vitals taken for this visit. Wt Readings from Last 3 Encounters:  12/09/15 214 lb (97.1 kg)  11/10/15 216 lb 3.2 oz (98.1 kg)  09/24/15 219 lb (99.3 kg)   General Appearance: Well nourished well developed, non-toxic appearing, in no apparent distress. Eyes: PERRLA, EOMs, conjunctiva no swelling or erythema ENT/Mouth: Ear canals clear with no erythema, swelling, or discharge.  TMs normal bilaterally,  oropharynx clear, moist, with no exudate.   Neck: Supple, thyroid normal, no JVD, no  cervical adenopathy.  Respiratory: Respiratory effort normal, breath sounds clear A&P, no wheeze, rhonchi or rales noted.  No retractions, no accessory muscle usage Cardio: RRR with no MRGs. No noted edema.  Abdomen: Soft, + BS.  Non tender, no guarding, rebound, hernias, masses. Musculoskeletal: Full ROM, 5/5 strength, Normal gait Skin: Warm, dry without rashes, lesions, ecchymosis.  Neuro: Awake and oriented X 3, Cranial nerves intact. No cerebellar symptoms.  Psych: normal affect, Insight and Judgment appropriate.    Starlyn Skeans, PA-C 1:56 PM [facility]

## 2016-05-11 ENCOUNTER — Other Ambulatory Visit: Payer: Self-pay | Admitting: Internal Medicine

## 2016-05-11 ENCOUNTER — Telehealth: Payer: Self-pay | Admitting: Cardiology

## 2016-05-11 LAB — HEMOGLOBIN A1C
HEMOGLOBIN A1C: 6 % — AB (ref <5.7)
MEAN PLASMA GLUCOSE: 126 mg/dL

## 2016-05-11 NOTE — Telephone Encounter (Signed)
New message     Calling to confirm diagnosis of CHF and to get most recent ejection fraction or copy of echo.  Fax is [phone]

## 2016-05-11 NOTE — Telephone Encounter (Signed)
Left message on secure voice mail for heather [facility]  information of last echo 10/22/15 -EF 45-50 % Prior echo 08/07/15 EF  25-30%   Any further question may call back

## 2016-05-12 ENCOUNTER — Ambulatory Visit: Payer: Medicare Other | Attending: General Surgery | Admitting: Physical Therapy

## 2016-05-12 DIAGNOSIS — M25512 Pain in left shoulder: Secondary | ICD-10-CM

## 2016-05-12 DIAGNOSIS — M25612 Stiffness of left shoulder, not elsewhere classified: Secondary | ICD-10-CM | POA: Insufficient documentation

## 2016-05-12 DIAGNOSIS — I890 Lymphedema, not elsewhere classified: Secondary | ICD-10-CM | POA: Insufficient documentation

## 2016-05-12 NOTE — Therapy (Signed)
[place], [place], [address] Phone: [phone]   Fax:  [phone]  Physical Therapy Evaluation  Patient Details  Name: [name] [name] MRN: [id number] Date of Birth: May 04, 1945 Referring Provider: Dr. Donne Hazel   Encounter Date: 05/12/2016      PT End of Session - 05/12/16 1725    Visit Number 1   Number of Visits 9   Date for PT Re-Evaluation 06/16/16   PT Start Time [id number]   PT Stop Time 1600   PT Time Calculation (min) 45 min   Activity Tolerance Patient limited by pain   Behavior During Therapy [facility] for tasks assessed/performed      Past Medical History:  Diagnosis Date  . Allergy    Nasonex daily as needed  . Anemia   . Anxiety   . Arthritis   . Breast cancer ([place])     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  . Chronic back pain    cyst sitting on L4-5;slipped disc  . Depression    takes Cymbalta daily  . Diverticulosis   . GERD (gastroesophageal reflux disease)    takes Omeprazole daily  . Headache(784.0)    rare  . History of gastric ulcer at age 71  . History of shingles   . Hyperlipidemia    takes Atorvastatin 3 times a week  . Hypertension    takes Cardura nightly and Verapamil daily  . Insomnia    takes Restoril nightly  . Joint pain   . Nonischemic cardiomyopathy ([place]) 07/2015   Normal coronary arteries by cath. EF was 25-30% by echo. GR2 DD - likely related to LBBB in setting of A. fib  . Paroxysmal atrial fibrillation ([place]): CHA2DS2-VASc Score - starting Eliquis 08/06/2015   This patients CHA2DS2-VASc Score and unadjusted Ischemic Stroke Rate (% per year) is equal to 4.8 % stroke rate/year from a score of 4  Above score calculated as 1 point each if present [CHF, HTN, DM, Vascular=MI/PAD/Aortic Plaque, Age if 71-74, or Female]; 2 points each if present [Age > 75, or Stroke/TIA/TE]  . PONV (postoperative nausea and vomiting)   . S/P radiation  therapy 07/18/2013-08/28/2013   1) Left Breast / 50 Gy in 25 fractions/ 2) Left Breast Boost / 10 Gy in 5 fractions  . Shingles   . Sinus drainage    put on Levaquin yesterday if no better in 3 days will start prednisone  . Thyroid disease   . Weakness    tingling and numbness both hands and left leg  . Wears glasses     Past Surgical History:  Procedure Laterality Date  . ABDOMINAL HYSTERECTOMY    . BREAST SURGERY     lt lump-neg  . CARDIAC CATHETERIZATION N/A 08/10/2015   Procedure: Right/Left Heart Cath and Coronary Angiography;  Surgeon: Burnell Blanks, MD;  Location: [facility];  Service: Cardiovascular;: Nonobstructive CAD  . CATARACT EXTRACTION BILATERAL W/ ANTERIOR VITRECTOMY Bilateral   . CHOLECYSTECTOMY    . COLONOSCOPY  2014  . DUPUYTREN / PALMAR FASCIOTOMY Bilateral 2007   x 3 to left and once to the right   . epidural injections     x 3  . FOOT SURGERY Right   . KNEE ARTHROSCOPY Bilateral   . LUMBAR FUSION  10/2013   Dr. Lynann Bologna  . TONSILLECTOMY    . TRANSTHORACIC ECHOCARDIOGRAM  08/07/2015   Severely reduced LVEF at 25-30% with diffuse HK. GR 2 DD. Ventricular  dyssynergy secondary to LBBB. Small to moderate pericardial effusion but no hemodynamic compromise  . TRANSTHORACIC ECHOCARDIOGRAM  10/2015    (EF up from 25-30%): - Left ventricle: Poor acoustic windows -  difficult to see.    EF estimated at 45-50% with inferoseptal and possible posterior hypokinesis. GR 1 DD. No pericardial effusion.  . TUBAL LIGATION    . UPPER GI ENDOSCOPY      There were no vitals filed for this visit.       Subjective Assessment - 05/12/16 1519    Subjective Pt reports pain in her left back, since her surgery and Dr. Donne Hazel thinks she may have scar tissue in breast and armpit.    Pertinent History left breast cancer with lumpectomy in Feb 2015 followed by radiation, no chemo. She sees Dr. Donne Hazel and Dr. Jana Hakim every 6 months She does not wear a compresison  bra Past history includes a fall down stairs with back surgery but she does not have trouble with her low back  She has history of cardiac issues.  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    Patient Stated Goals her goal is to stop hurting.    Currently in Pain? Yes   Pain Score 7    Pain Location Back   Pain Orientation Left   Pain Descriptors / Indicators Aching;Constant   Pain Type Chronic pain   Pain Radiating Towards none    Pain Onset More than a month ago   Pain Frequency Constant   Aggravating Factors  she thought it was her clothing, but that is not it.    Pain Relieving Factors pain pill    Effect of Pain on Daily Activities pt states she does them anyway             [facility] PT Assessment - 05/12/16 0001      Assessment   Medical Diagnosis left breast cancer    Referring Provider Dr. Donne Hazel    Onset Date/Surgical Date 05/12/13  approximate   Hand Dominance Right   Prior Therapy none     Precautions   Precautions Other (comment)   Precaution Comments previous cancer      Restrictions   Weight Bearing Restrictions No     Balance Screen   Has the patient fallen in the past 6 months No  falls will not be addressed this session    Has the patient had a decrease in activity level because of a fear of falling?  Yes  pt state she is not as steady on her feet.   Is the patient reluctant to leave their home because of a fear of falling?  No     Home Environment   Living Environment Private residence   Living Arrangements Alone   Available Help at Discharge Friend(s);Neighbor   Type of Home Apartment  condo      Prior Function   Level of Independence Independent   Vocation Part time employment   Social worker at school, has to left trays at times, works with arms in standing    Leisure doesn't exercise, has a little dog      Cognition   Overall Cognitive Status Within  Functional Limits for tasks assessed     Observation/Other Assessments   Observations pt obese, appears uncomfortable    Skin Integrity intact    Quick DASH  63.64     Observation/Other  Assessments-Edema    Edema --  fullness in left axilla and lateral trunk  and back      Sensation   Light Touch Not tested     Coordination   Gross Motor Movements are Fluid and Coordinated No  pt moves slowly, complains of knee pain      Posture/Postural Control   Posture/Postural Control Postural limitations   Postural Limitations Rounded Shoulders;Forward head     AROM   Right Shoulder Flexion 144 Degrees   Right Shoulder ABduction 128 Degrees   Right Shoulder External Rotation 85 Degrees   Left Shoulder Flexion 145 Degrees   Left Shoulder ABduction 130 Degrees  feels pulling    Left Shoulder External Rotation 75 Degrees  pulling   Cervical - Right Side Bend 30   Cervical - Left Side Bend 25  pain in back at left shoulder      Strength   Overall Strength Deficits   Overall Strength Comments generalized deconditioning and decreased scapular and core stabalization      Palpation   Palpation comment pt has tender trigger point at left medical infterior scapular area. tender fullness at left axilla and also at anterior chest over pec major            LYMPHEDEMA/ONCOLOGY QUESTIONNAIRE - 05/12/16 1717      Type   Cancer Type left breast      Right Upper Extremity Lymphedema   10 cm Proximal to Olecranon Process 36.5 cm   Olecranon Process 27.5 cm   10 cm Proximal to Ulnar Styloid Process 23 cm   Just Proximal to Ulnar Styloid Process 16 cm   Across Hand at [employer] 19 cm   At [place] of 2nd Digit 6.5 cm     Left Upper Extremity Lymphedema   10 cm Proximal to Olecranon Process 35 cm   Olecranon Process 27.5 cm   10 cm Proximal to Ulnar Styloid Process 21 cm   Just Proximal to Ulnar Styloid Process 15.5 cm   Across Hand at [employer] 19 cm   At [place] of 2nd Digit 6  cm           Quick Dash - 05/12/16 0001    Open a tight or new jar Unable   Do heavy household chores (wash walls, wash floors) Moderate difficulty   Carry a shopping bag or briefcase Moderate difficulty   Wash your back Unable   Use a knife to cut food Moderate difficulty   Recreational activities in which you take some force or impact through your arm, shoulder, or hand (golf, hammering, tennis) Unable   During the past week, to what extent has your arm, shoulder or hand problem interfered with your normal social activities with family, friends, neighbors, or groups? Quite a bit   During the past week, to what extent has your arm, shoulder or hand problem limited your work or other regular daily activities Moderer[place]   Arm, shoulder, or hand pain. Moderate   Tingling (pins and needles) in your arm, shoulder, or hand Moderate   Difficulty Sleeping Mild difficulty   DASH Score 63.64 %                             Long Term Clinic Goals - 05/12/16 1732      CC Long Term Goal  #1   Title Patient will report a decrease in pain by 50% so they  can perform daily activities with greater ease   Time 4   Period Weeks   Status New     CC Long Term Goal  #2   Title Patient will decrease the DASH score to <57    to demonstrate increased functional use of upper extremity   Baseline 63.64   Time 4   Period Weeks   Status New     CC Long Term Goal  #3   Title Patient will be know how to obtain and use compression garments for maintenance phase of treatment   Time 4   Period Weeks   Status New     CC Long Term Goal  #4   Title Patient will be independent in a home exercise program   Time 4   Period Weeks   Status New            Plan - 05/12/16 1725    Clinical Impression Statement Obese female with multiple comorbidites presents with evolving pain in back with fullness in left chest axilla, lateral chest and back with pain. She has stiffness in shoulder  and neck.  Because of these cormorbities this is a moderate complexity eval    Rehab Potential Good   Clinical Impairments Affecting Rehab Potential previous radiation    PT Frequency 2x / week   PT Duration 4 weeks   PT Treatment/Interventions ADLs/Self Care Home Management;Patient/family education;DME Instruction;Orthotic Fit/Training;Taping;Manual techniques;Therapeutic activities;Manual lymph drainage;Therapeutic exercise;Passive range of motion   PT Next Visit Plan soft tissue work to left anterior and posterior upper quadrant with manual lymph draiange, begin scapulare stabalization exercise, show compression bra options    Consulted and Agree with Plan of Care Patient      Patient will benefit from skilled therapeutic intervention in order to improve the following deficits and impairments:  Obesity, Increased edema, Decreased knowledge of use of DME, Decreased knowledge of precautions, Impaired UE functional use, Pain, Decreased strength, Decreased range of motion, Postural dysfunction  Visit Diagnosis: Pain in joint of left shoulder region - Plan: PT plan of care cert/re-cert  Stiffness of left shoulder, not elsewhere classified - Plan: PT plan of care cert/re-cert  Lymphedema, not elsewhere classified - Plan: PT plan of care cert/re-cert      G-Codes - [id number]    Functional Limitation Carrying, moving and handling objects   Carrying, Moving and Handling Objects Current Status [id number]) At least 60 percent but less than 80 percent impaired, limited or restricted   Carrying, Moving and Handling Objects Goal Status [id number]) At least 40 percent but less than 60 percent impaired, limited or restricted       Problem List Patient Active Problem List   Diagnosis Date Noted  . Medication dose changed 12/09/2015  . Non-ischemic cardiomyopathy ([place])   . Chronic combined systolic and diastolic heart failure, NYHA class 2 ([place]) -- exacerbated by A. fib 08/08/2015  . Paroxysmal  atrial fibrillation [facility]): CHA2DS2-VASc Score - starting Eliquis 08/06/2015  . Hypothyroidism 08/06/2015  . History of ulcer disease 08/06/2015  . Absolute anemia   . Encounter for Medicare annual wellness exam 03/31/2015  . DDD (degenerative disc disease), lumbar 06/16/2014  . Prediabetes 12/18/2013  . Vitamin D deficiency 12/18/2013  . Medication management 12/18/2013  . Paroxysmal supraventricular tachycardia ([place]) 10/18/2013  . Radiculopathy 10/16/2013  . Obesity (BMI 30-39.9) 06/11/2013  . Cancer of lower-inner quadrant of left female breast ([place]) 05/20/2013  . History of pancreatitis 03/27/2008  . Hyperlipidemia 03/13/2008  . ANXIETY  DEPRESSION 03/13/2008  . Essential hypertension 03/13/2008  . H/O left bundle branch block 03/13/2008  . Allergic rhinitis 03/13/2008  . GERD 03/13/2008   Donato Heinz. Owens Shark PT  [place]wood Levo 05/12/2016, 5:37 PM  [place], [place], [address] Phone: [phone]   Fax:  [phone]  Name: KATRIN MANSEL MRN: [id number] Date of Birth: 03-28-1946

## 2016-05-17 ENCOUNTER — Ambulatory Visit: Payer: Medicare Other | Admitting: Physical Therapy

## 2016-05-19 ENCOUNTER — Encounter: Payer: Medicare Other | Admitting: Physical Therapy

## 2016-05-25 ENCOUNTER — Ambulatory Visit (INDEPENDENT_AMBULATORY_CARE_PROVIDER_SITE_OTHER): Payer: Medicare Other | Admitting: Cardiology

## 2016-05-25 VITALS — BP 150/78 | HR 78 | Ht 65.0 in | Wt 226.0 lb

## 2016-05-25 DIAGNOSIS — I428 Other cardiomyopathies: Secondary | ICD-10-CM

## 2016-05-25 DIAGNOSIS — Z87898 Personal history of other specified conditions: Secondary | ICD-10-CM

## 2016-05-25 DIAGNOSIS — I480 Paroxysmal atrial fibrillation: Secondary | ICD-10-CM | POA: Diagnosis not present

## 2016-05-25 DIAGNOSIS — Z8679 Personal history of other diseases of the circulatory system: Secondary | ICD-10-CM

## 2016-05-25 DIAGNOSIS — I5042 Chronic combined systolic (congestive) and diastolic (congestive) heart failure: Secondary | ICD-10-CM

## 2016-05-25 DIAGNOSIS — I10 Essential (primary) hypertension: Secondary | ICD-10-CM | POA: Diagnosis not present

## 2016-05-25 DIAGNOSIS — E782 Mixed hyperlipidemia: Secondary | ICD-10-CM

## 2016-05-25 MED ORDER — APIXABAN 5 MG PO TABS: 5.0000 mg | ORAL_TABLET | Freq: Two times a day (BID) | ORAL | 3 refills | Status: DC

## 2016-05-25 NOTE — Assessment & Plan Note (Signed)
Labs are followed by PCP on current dose of statin. She is doing pretty well from a lipid standpoint his most recent labs. LDL is less than 100 which is a decent goal for her

## 2016-05-25 NOTE — Patient Instructions (Addendum)
MAY STOP  ASPIRIN   MEDICATION REFILLED   KEEP  READING ON BLOOD PRESSURE - CONTACT OFFICE IF BLOOD PRESSURE EQUAL OR GREATER THAN 140/80-90   Your physician wants you to follow-up in [place] [address] [facility]. You will receive a reminder letter in the mail two months in advance. If you don't receive a letter, please call our office to schedule the follow-up appointment.    If you need a refill on your cardiac medications before your next appointment, please call your pharmacy.

## 2016-05-25 NOTE — Assessment & Plan Note (Signed)
Long-standing problem. This is probably affecting her. Explained This Process to Her. I Also Explained How A. fib Would Make It Worse. Probably Related to Hypotension. Angiographically Normal Coronaries.

## 2016-05-25 NOTE — Progress Notes (Signed)
PCP: Alesia Richards, MD  Clinic Note: Chief Complaint  Patient presents with  . Follow-up    5 months  . Headache  . Chest Pain    more of  a flutter sensation & occasional sharp pain    HPI: [name] [name] is a 71 y.o. female with a PMH below who presents today for Six-month follow-up for nonischemic cardiomyopathy. Long history of left bundle branch block dating back 2007. Also has history of breast cancer, hyperlipidemia and thyroid disease. EF by echo in April 2017 showed 25-30% with paradoxical septal wall motion. She also was found to be in atrial fibrillation and converted to sinus rhythm. Angiographically normal coronary arteries in May 2017. Not started on anticoagulation for her A. fib because of significant anemia. Follow-up echo in July 2017 showed EF up to 45-50% with inferoseptal apical kinesis  Jaice L Mishkin was last seen in Aug 2017.  She was doing relatively well without any major issues. No recurrent rapid irregular heartbeat sensations to suggest recurrence of A. fib. Only noticing brief fluttering sensations in her chest.  Recent Hospitalizations: None  Studies Reviewed: No new studies  Interval History: Feona comes in today doing quite well. She still has intermittent brief flutter episodes of chest the last maybe a few seconds. She also has occasional left-sided chest discomfort along left side of her breast is very sharp and itching in nature. This is usually associated with certain movements. She takes a lot of iron and therefore she is not able to tell if she has melena, but denies any nosebleeds or hematochezia or epistaxis. She still takes Mobic PRN for arthritis pains.  She has not had a recurrence of rapid irregular heartbeats to suggest recurrence of A. fib. No exertional chest pain or dyspnea unless she overdoes it - may note dyspnea. She may get short of breath, for instance, if she is rushing up a flight of steps.  She only notes  dizziness when she bends over and stands up rapidly. Otherwise no weakness, dizziness, syncope/near syncope, or TIA/amaurosis fugax.  No claudication.  ROS: A comprehensive was performed. Review of Systems  Constitutional: Negative for malaise/fatigue.  HENT: Negative for congestion and hearing loss.   Respiratory: Negative for cough, shortness of breath and wheezing.        Has avoided high frequency URI/flu like illnesses  Cardiovascular:       Per history of present illness  Gastrointestinal: Negative for abdominal pain, constipation and heartburn.  Genitourinary: Negative for dysuria.  Musculoskeletal: Positive for joint pain (Bilateral knees, left worse; also left shoulder). Negative for falls.  Neurological: Positive for dizziness (Positional - with bending over).  Endo/Heme/Allergies: Does not bruise/bleed easily.  Psychiatric/Behavioral: Negative for memory loss. The patient is nervous/anxious (She is under a lot of stress now because for the last 2 months she has been billed by the [facility] 3 times in a month (likely being billed for someone who lives in [place]). The patient does not have insomnia.   All other systems reviewed and are negative.   Past Medical History:  Diagnosis Date  . Allergy    Nasonex daily as needed  . Anemia   . Anxiety   . Arthritis   . 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 Chronic back pain    cyst sitting on L4-5;slipped disc  . Depression    takes Cymbalta daily  . Diverticulosis   .  GERD (gastroesophageal reflux disease)    takes Omeprazole daily  . Headache(784.0)    rare  . History of gastric ulcer at age 32  . History of shingles   . Hyperlipidemia    takes Atorvastatin 3 times a week  . Hypertension    takes Cardura nightly and Verapamil daily  . Insomnia    takes Restoril nightly  . Joint pain   . Nonischemic cardiomyopathy ([place]) 07/2015   Normal coronary arteries  by cath. EF was 25-30% by echo. GR2 DD - likely related to LBBB in setting of A. fib  . Paroxysmal atrial fibrillation ([place]): CHA2DS2-VASc Score - starting Eliquis 08/06/2015   This patients CHA2DS2-VASc Score and unadjusted Ischemic Stroke Rate (% per year) is equal to 4.8 % stroke rate/year from a score of 4  Above score calculated as 1 point each if present [CHF, HTN, DM, Vascular=MI/PAD/Aortic Plaque, Age if 65-74, or Female]; 2 points each if present [Age > 75, or Stroke/TIA/TE]  . PONV (postoperative nausea and vomiting)   . S/P radiation therapy 07/18/2013-08/28/2013   1) Left Breast / 50 Gy in 25 fractions/ 2) Left Breast Boost / 10 Gy in 5 fractions  . Shingles   . Sinus drainage    put on Levaquin yesterday if no better in 3 days will start prednisone  . Thyroid disease   . Weakness    tingling and numbness both hands and left leg  . Wears glasses     Past Surgical History:  Procedure Laterality Date  . ABDOMINAL HYSTERECTOMY    . BREAST SURGERY     lt lump-neg  . CARDIAC CATHETERIZATION N/A 08/10/2015   Procedure: Right/Left Heart Cath and Coronary Angiography;  Surgeon: Burnell Blanks, MD;  Location: [facility];  Service: Cardiovascular;: Nonobstructive CAD  . CATARACT EXTRACTION BILATERAL W/ ANTERIOR VITRECTOMY Bilateral   . CHOLECYSTECTOMY    . COLONOSCOPY  2014  . DUPUYTREN / PALMAR FASCIOTOMY Bilateral 2007   x 3 to left and once to the right   . epidural injections     x 3  . FOOT SURGERY Right   . KNEE ARTHROSCOPY Bilateral   . LUMBAR FUSION  10/2013   Dr. Lynann Bologna  . TONSILLECTOMY    . TRANSTHORACIC ECHOCARDIOGRAM  08/07/2015   Severely reduced LVEF at 25-30% with diffuse HK. GR 2 DD. Ventricular dyssynergy secondary to LBBB. Small to moderate pericardial effusion but no hemodynamic compromise  . TRANSTHORACIC ECHOCARDIOGRAM  10/2015    (EF up from 25-30%): - Left ventricle: Poor acoustic windows -  difficult to see.    EF estimated at 45-50% with  inferoseptal and possible posterior hypokinesis. GR 1 DD. No pericardial effusion.  . TUBAL LIGATION    . UPPER GI ENDOSCOPY      Current Meds  Medication Sig  . anastrozole (ARIMIDEX) 1 MG tablet Take 1 tablet (1 mg total) by mouth daily.  Marland Kitchen apixaban (ELIQUIS) 5 MG TABS tablet Take 1 tablet (5 mg total) by mouth 2 (two) times daily.  Marland Kitchen aspirin EC 81 MG EC tablet Take 1 tablet (81 mg total) by mouth daily.  Marland Kitchen atorvastatin (LIPITOR) 80 MG tablet Take 0.5 tablets (40 mg total) by mouth daily.  Marland Kitchen buPROPion (WELLBUTRIN XL) 150 MG 24 hr tablet TAKE 1 TABLET (150 MG TOTAL) BY MOUTH EVERY MORNING.  . carvedilol (COREG) 12.5 MG tablet TAKE 1 TABLET 2 TIMES DAILY OR AS DIRECTED.  Marland Kitchen Cholecalciferol (VITAMIN D) 2000 UNITS tablet Take 4,000  Units by mouth daily.  . cyclobenzaprine (FLEXERIL) 10 MG tablet Take 10 mg by mouth at bedtime.  . DULoxetine (CYMBALTA) 30 MG capsule Take 1 capsule (30 mg total) by mouth 3 (three) times daily.  . ferrous sulfate 325 (65 FE) MG tablet TAKE 1 TABLET (325 MG TOTAL) BY MOUTH 2 (TWO) TIMES DAILY WITH A MEAL.  . furosemide (LASIX) 20 MG tablet TAKE 1 TABLET BY MOUTH TWICE A DAY  . gabapentin (NEURONTIN) 300 MG capsule Take 1 capsule (300 mg total) by mouth at bedtime.  Marland Kitchen ipratropium (ATROVENT) 0.03 % nasal spray Place 2 sprays into the nose 3 (three) times daily.  Marland Kitchen levothyroxine (SYNTHROID, LEVOTHROID) 50 MCG tablet TAKE 1/2 TO 1 TABLET ON AN EMPTY STOMACH EVERY MORNING 30 TO 60 MIN BEFORE BREAKFAST  . meloxicam (MOBIC) 15 MG tablet TAKE 1/2 TO 1 TABLET DAILY WITH FOOD FOR PAIN *DONT TAKE WITH ALEVE OR IBUPROFEN CAN TAKE TYLENOL*  . mometasone (NASONEX) 50 MCG/ACT nasal spray USE 1 TO 2 SPRAYS IN EACH NOSTRIL TWICE DAILY  . ranitidine (ZANTAC) 300 MG tablet TAKE 1 TO 2 TABLETS DAILY AS NEEDED FOR ACID INDIGESTION & REFLUX  . temazepam (RESTORIL) 30 MG capsule TAKE ONE CAPSULE BY MOUTH AT BEDTIME AS NEEDED FOR SLEEP  . traMADol (ULTRAM) 50 MG tablet TAKE 1 TABLET BY  MOUTH 4 TIMES A DAY AS NEEDED  . valsartan (DIOVAN) 80 MG tablet Take 1 tablet (80 mg total) by mouth daily.  . [DISCONTINUED] apixaban (ELIQUIS) 5 MG TABS tablet Take 1 tablet (5 mg total) by mouth 2 (two) times daily.    Allergies  Allergen Reactions  . Requip [Ropinirole Hcl] Shortness Of Breath and Nausea And Vomiting  . Codeine Other (See Comments)    fatigue  . Minocycline Hives  . Morphine Sulfate Other (See Comments)    Palpitations  . Tetracyclines & Related Hives    Pt doesn't remember a reaction  . Zestril [Lisinopril] Cough  . Zetia [Ezetimibe] Other (See Comments)    Muscle and joint pain  . Amoxicillin Rash    Pt doesn't remember a reaction  . Sulfa Antibiotics Other (See Comments)    Headache (pt states that a blood pressure medicine caused a severe headache but doesn't remember a reaction to sulfa)    Social History   Social History  . Marital status: Widowed    Spouse name: N/A  . Number of children: N/A  . Years of education: N/A   Social History Main Topics  . Smoking status: Never Smoker  . Smokeless tobacco: Never Used  . Alcohol use Yes     Comment: wine occasionally-rarely  . Drug use: No  . Sexual activity: Not Asked   Other Topics Concern  . None   Social History Narrative  . None    family history includes COPD in her father; Heart attack in her mother; Hypertension in her father; Stroke in her mother.  Wt Readings from Last 3 Encounters:  05/25/16 102.5 kg (226 lb)  05/10/16 103 kg (227 lb)  12/09/15 97.1 kg (214 lb)    PHYSICAL EXAM BP (!) 150/78   Pulse 78   Ht 5\' 5"  (1.651 m)   Wt 102.5 kg (226 lb)   BMI 37.61 kg/m  General appearance: alert, cooperative, appears stated age, no distress. Moderately obese, but otherwise well-nourished and well-groomed. Pleasant mood and affect.  Neck: no adenopathy, no carotid bruit and no JVD Lungs: clear to auscultation bilaterally, normal percussion bilaterally and  non-labored Heart: RRR  with normal S1 and partially split S2. She is a very soft SEM at the left upper sternal border. No M/R/G. Nondisplaced PMI  Abdomen: soft, non-tender; bowel sounds normal; no masses,  no organomegaly; no HJR Extremities: extremities normal, atraumatic, no cyanosis, or edema  Pulses: 2+ and symmetric; Skin: mobility and turgor normal, no evidence of bleeding or bruising, no lesions noted and She has several small scratches from her dog, but no significant lesions Neurologic: Mental status: Alert, oriented, thought content appropriate    Adult ECG Report  Rate: 78 ;  Rhythm: normal sinus rhythm and LBBB. Borderline LVH. Otherwise normal axis and durations.;   Narrative Interpretation: Stable EKG   Other studies Reviewed: Additional studies/ records that were reviewed today include:  Recent Labs:   Lab Results  Component Value Date   CREATININE 0.92 05/10/2016   BUN 19 05/10/2016   NA 139 05/10/2016   K 3.7 05/10/2016   CL 103 05/10/2016   CO2 25 05/10/2016   Lab Results  Component Value Date   CHOL 175 05/10/2016   HDL 45 (L) 05/10/2016   LDLCALC 94 05/10/2016   TRIG 179 (H) 05/10/2016   CHOLHDL 3.9 05/10/2016     ASSESSMENT / PLAN: Problem List Items Addressed This Visit    Chronic combined systolic and diastolic heart failure, NYHA class 2 ([place]) -- exacerbated by A. fib (Chronic)    Thankfully, she seems euvolemic on ARB, beta blocker and stable dose of diuretic.      Relevant Medications   apixaban (ELIQUIS) 5 MG TABS tablet   Essential hypertension (Chronic)    Borderline control today, she seems to be relatively stressed today. We have increased her Diovan to 80 mg last time. I've asked her to monitor her pressures at home to see how the pressures pain now. She said that  last week her blood pressure was 120/80. If her pressures are averaging over 135/90, I would probably consider increasing ARB dose.       Relevant Medications   apixaban (ELIQUIS) 5 MG TABS tablet    Other Relevant Orders   EKG 12-Lead (Completed)   H/O left bundle branch block (Chronic)    Long-standing problem. This is probably affecting her. Explained This Process to Her. I Also Explained How A. fib Would Make It Worse. Probably Related to Hypotension. Angiographically Normal Coronaries.      Relevant Orders   EKG 12-Lead (Completed)   History of ulcer disease    With history of PUD, I think we can stop aspirin as I'm keeping the ELIQUIS on board for stroke prophylaxis.      Hyperlipidemia (Chronic)    Labs are followed by PCP on current dose of statin. She is doing pretty well from a lipid standpoint his most recent labs. LDL is less than 100 which is a decent goal for her      Relevant Medications   apixaban (ELIQUIS) 5 MG TABS tablet   Non-ischemic cardiomyopathy ([place]) - Primary (Chronic)    EF has definitely improved based on her follow-up echocardiogram. Probably this is related to hypertensive heart disease and left bundle-branch block. The reduced EF is probably related to her being in A. fib with left bundle branch block. She really does seem to be having much active CHF symptoms. She is on stable dose of valsartan and carvedilol. She is not taking any additional dose of Lasix. We talked about the potential use of when necessary Lasix for worsening symptoms.  Relevant Medications   apixaban (ELIQUIS) 5 MG TABS tablet   Paroxysmal atrial fibrillation ([place]): CHA2DS2-VASc Score - starting Eliquis (Chronic)    Thankfully, it would appear that she has not had any recurrence of symptoms. She is on stable dose of beta blocker for rate control. She is anticoagulated with ELIQUIS.  - We can stop her aspirin.      Relevant Medications   apixaban (ELIQUIS) 5 MG TABS tablet   Other Relevant Orders   EKG 12-Lead (Completed)      Current medicines are reviewed at length with the patient today. (+/- concerns) n/a The following changes have been made: se  below  Patient Instructions  MAY [place] ON BLOOD PRESSURE - CONTACT OFFICE IF BLOOD PRESSURE EQUAL OR GREATER THAN 140/80-90   Your physician wants you to follow-up in [place] DR Mira Balon. You will receive a reminder letter in the mail two months in advance. If you don't receive a letter, please call our office to schedule the follow-up appointment.    If you need a refill on your cardiac medications before your next appointment, please call your pharmacy.     Studies Ordered:   Orders Placed This Encounter  Procedures  . EKG 12-Lead      Glenetta Hew, M.D., M.S. Interventional Cardiologist   Pager # [phone] Phone # [phone] [address] [place], [place] [address]

## 2016-05-25 NOTE — Assessment & Plan Note (Signed)
Thankfully, she seems euvolemic on ARB, beta blocker and stable dose of diuretic.

## 2016-05-25 NOTE — Assessment & Plan Note (Signed)
Thankfully, it would appear that she has not had any recurrence of symptoms. She is on stable dose of beta blocker for rate control. She is anticoagulated with ELIQUIS.  - We can stop her aspirin.

## 2016-05-25 NOTE — Assessment & Plan Note (Signed)
With history of PUD, I think we can stop aspirin as I'm keeping the ELIQUIS on board for stroke prophylaxis.

## 2016-05-25 NOTE — Assessment & Plan Note (Addendum)
EF has definitely improved based on her follow-up echocardiogram. Probably this is related to hypertensive heart disease and left bundle-branch block. The reduced EF is probably related to her being in A. fib with left bundle branch block. She really does seem to be having much active CHF symptoms. She is on stable dose of valsartan and carvedilol. She is not taking any additional dose of Lasix. We talked about the potential use of when necessary Lasix for worsening symptoms.

## 2016-05-25 NOTE — Assessment & Plan Note (Signed)
Borderline control today, she seems to be relatively stressed today. We have increased her Diovan to 80 mg last time. I've asked her to monitor her pressures at home to see how the pressures pain now. She said that  last week her blood pressure was 120/80. If her pressures are averaging over 135/90, I would probably consider increasing ARB dose.

## 2016-05-26 ENCOUNTER — Encounter: Payer: Medicare Other | Admitting: Physical Therapy

## 2016-05-27 ENCOUNTER — Encounter: Payer: Self-pay | Admitting: Cardiology

## 2016-05-30 ENCOUNTER — Ambulatory Visit
Admission: RE | Admit: 2016-05-30 | Discharge: 2016-05-30 | Disposition: A | Discharge status: Home / Self Care | Payer: Medicare Other | Source: Ambulatory Visit | Attending: Oncology | Admitting: Oncology

## 2016-05-30 DIAGNOSIS — Z853 Personal history of malignant neoplasm of breast: Secondary | ICD-10-CM

## 2016-05-31 ENCOUNTER — Encounter: Payer: Medicare Other | Admitting: Physical Therapy

## 2016-06-01 ENCOUNTER — Other Ambulatory Visit: Payer: Self-pay | Admitting: Internal Medicine

## 2016-06-02 ENCOUNTER — Encounter: Payer: Medicare Other | Admitting: Physical Therapy

## 2016-06-10 ENCOUNTER — Telehealth: Payer: Self-pay | Admitting: Cardiology

## 2016-06-10 NOTE — Telephone Encounter (Signed)
New Message     Fax : [phone]   They need to know what the diagnosis is for this pt, if she is a CHF pt and what was the ejection fraction from last echo.   They started a new program where they mail pt a tablet, scale and information on how to maintain their health, but this is only for CHF patients

## 2016-06-10 NOTE — Telephone Encounter (Signed)
Left detailed message Yes she is CHF pt  LVEF is approximatey 45 to 50%

## 2016-06-17 ENCOUNTER — Telehealth: Payer: Self-pay | Admitting: Cardiology

## 2016-06-17 DIAGNOSIS — Z79899 Other long term (current) drug therapy: Secondary | ICD-10-CM

## 2016-06-17 NOTE — Telephone Encounter (Signed)
New message    Heather Case manager for [facility] is calling reporting pt has symptoms of a GI bleed. She states there is no blood in her stool. Pt is on eliquis 5mg  twice a day. Heather states pt has dark stool-pt takes iron, cramping in abdomen, tired and back pain.

## 2016-06-17 NOTE — Telephone Encounter (Signed)
Returned call to patient. She states her stomach is "giving her a problem", feels like it is "tying up in knots". She takes zantac 2-3x/daily. Her [facility] nurse told her it could be Eliquis - she was concerned she has a GI bleed. Patient states she is not as concerned by her symptoms as the nurse.  Patient states she may have noticed a bright blood in her stool (faint). Patient states her stools are generally dark as she takes iron. Patient states she feels tired. Las CBC in [place] from Jan 2018.   She saw Dr. Deatra Ina (GI) in the past for gall bladder issues - does not routinely see gastroenterologist   Advised patient to monitor for s/sx of bleeding. Informed her will send to MD for advice and she will be notified if he has suggestions.

## 2016-06-20 NOTE — Telephone Encounter (Signed)
This is not a common side effect from [facility]. Nausea is oftentimes an issue, but not abdominal pain. She did have a slightly reduced hemoglobin was checked in January. May not be a bad idea to recheck his CBC. While we have her hold ELIQUIS for couple days to see if the symptoms improve. (This is much like to be a side effect of Pradaxa than ELIQUIS). If symptoms do not improve being off ELIQUIS, I would recommend she discuss with her PCP.  Glenetta Hew, MD

## 2016-06-20 NOTE — Telephone Encounter (Signed)
Returned call to patient. She states she is feeling better, but is having diarrhea. This just started today. Patient would prefer not to hold her Eliquis. She will get a CBC done this week.   She states her heart feels fluttery, does not fee like her heart is racing. She states it does not do this all the time. No symptoms associated with this - SOB, chest pain.   She has had occasional chest pain separate from flutters. Her [facility] nurse suggested she take an extra lasix and this helped w/symptoms.   She also states her [facility] nurse told her she had HF and she is supposed to be getting some scales. Explained HF to patient.   Advised to see PCP eval if diarrhea persists.

## 2016-06-22 ENCOUNTER — Other Ambulatory Visit: Payer: Self-pay | Admitting: *Deleted

## 2016-06-22 DIAGNOSIS — Z79899 Other long term (current) drug therapy: Secondary | ICD-10-CM

## 2016-06-22 LAB — CBC
HCT: 32 % — ABNORMAL LOW (ref 35.0–45.0)
Hemoglobin: 10.9 g/dL — ABNORMAL LOW (ref 11.7–15.5)
MCH: 30.6 pg (ref 27.0–33.0)
MCHC: 34.1 g/dL (ref 32.0–36.0)
MCV: 89.9 fL (ref 80.0–100.0)
MPV: 10.6 fL (ref 7.5–12.5)
PLATELETS: 325 10*3/uL (ref 140–400)
RBC: 3.56 MIL/uL — ABNORMAL LOW (ref 3.80–5.10)
RDW: 13.7 % (ref 11.0–15.0)
WBC: 8.3 10*3/uL (ref 3.8–10.8)

## 2016-06-22 NOTE — Telephone Encounter (Signed)
Socially did have pretty significant cardiomyopathy. Recheck of her echocardiograms that her EF has improved, but I do agree that taking additional dose of Lasix may help if she has some discomfort in her chest because it may be related to increase filling pressures.  She may be having short episodes of A. fib and that may be related to her having some episodes of chest discomfort. Sometimes when you have A. fib, the diastolic dysfunction is worsened. Then when you go back into normal rhythm the symptoms appear.  Thanks for explaining the concept of heart failure. She just needs to understand that it's okay to take an additional dose of Lasix if she feels more short of breath or has some discomfort. Thankfully we have relatively reassured by her negative catheterization.   Glenetta Hew, MD

## 2016-06-23 NOTE — Telephone Encounter (Signed)
Patient notified of MD advice regarding lasix use. Voiced understanding.   Patient notified of CBC results.

## 2016-06-27 ENCOUNTER — Other Ambulatory Visit: Payer: Self-pay | Admitting: *Deleted

## 2016-06-27 MED ORDER — IPRATROPIUM BROMIDE 0.03 % NA SOLN: 2.0000 | Freq: Three times a day (TID) | NASAL | 0 refills | Status: DC

## 2016-06-28 ENCOUNTER — Other Ambulatory Visit: Payer: Self-pay | Admitting: *Deleted

## 2016-06-28 MED ORDER — IPRATROPIUM BROMIDE 0.03 % NA SOLN: 2.0000 | Freq: Three times a day (TID) | NASAL | 3 refills | Status: DC

## 2016-06-29 ENCOUNTER — Telehealth: Payer: Self-pay | Admitting: Cardiology

## 2016-06-29 NOTE — Telephone Encounter (Signed)
Returned call to patient She states she "don't feel good at all" She has pain in her back - like she did when she had that little episode in there heart She has a funny feeling in her head Patient usually takes her BP after taking her medications after returning home from work Medications reviewed w/patient She wonders if atrovent can increase her BP? Unsure on this.. Will defer to pharmacy -- also states after using this, makes her eyes water  OK to leave message w/instructions. Patient works in [employer] routed to Dr. Ellyn Hack for advice

## 2016-06-29 NOTE — Telephone Encounter (Signed)
New Message  Pt voiced about md discussing about bp  Pt voiced this weekend it's been up and wanting to know if we need to increase her medication or what was what.  156/79, 129/79, 131/79, 116/67  144/75, 162/79, 163/79, 167/78 157/72, 131/70-this reading starts from today.  Please f/u

## 2016-06-30 ENCOUNTER — Encounter: Payer: Self-pay | Admitting: Physician Assistant

## 2016-06-30 NOTE — Telephone Encounter (Signed)
Usually inhalers don't increase blood pressure, they tend to increase the heart rate.  Her blood pressure does seem to be a little high. My inclination would be to have her take twice the dose of her current Diovan prescription to see what her blood pressure does. If he looks better, then we will simply increase the prescription dose to 160 mg .  Glenetta Hew, MD

## 2016-07-01 NOTE — Telephone Encounter (Signed)
LM with MD advice & instructions on VM. Advised to continue to monitor BP and call with readings in 7-10 days. Med list updated.

## 2016-07-03 ENCOUNTER — Other Ambulatory Visit: Payer: Self-pay | Admitting: Internal Medicine

## 2016-07-11 ENCOUNTER — Other Ambulatory Visit: Payer: Self-pay | Admitting: Physician Assistant

## 2016-07-12 ENCOUNTER — Other Ambulatory Visit: Payer: Self-pay | Admitting: Adult Health

## 2016-07-12 DIAGNOSIS — Z170 Estrogen receptor positive status [ER+]: Principal | ICD-10-CM

## 2016-07-12 DIAGNOSIS — C50312 Malignant neoplasm of lower-inner quadrant of left female breast: Secondary | ICD-10-CM

## 2016-07-13 ENCOUNTER — Ambulatory Visit (HOSPITAL_BASED_OUTPATIENT_CLINIC_OR_DEPARTMENT_OTHER): Payer: Medicare Other | Admitting: Oncology

## 2016-07-13 ENCOUNTER — Other Ambulatory Visit (HOSPITAL_BASED_OUTPATIENT_CLINIC_OR_DEPARTMENT_OTHER): Payer: Medicare Other

## 2016-07-13 ENCOUNTER — Telehealth: Payer: Self-pay | Admitting: Oncology

## 2016-07-13 VITALS — BP 153/73 | HR 90 | Temp 98.1°F | Resp 18 | Ht 65.0 in | Wt 225.2 lb

## 2016-07-13 DIAGNOSIS — Z170 Estrogen receptor positive status [ER+]: Secondary | ICD-10-CM

## 2016-07-13 DIAGNOSIS — C50312 Malignant neoplasm of lower-inner quadrant of left female breast: Secondary | ICD-10-CM

## 2016-07-13 DIAGNOSIS — N951 Menopausal and female climacteric states: Secondary | ICD-10-CM | POA: Diagnosis not present

## 2016-07-13 DIAGNOSIS — Z79811 Long term (current) use of aromatase inhibitors: Secondary | ICD-10-CM

## 2016-07-13 LAB — COMPREHENSIVE METABOLIC PANEL
ALT: 20 U/L (ref 0–55)
AST: 17 U/L (ref 5–34)
Albumin: 3.6 g/dL (ref 3.5–5.0)
Alkaline Phosphatase: 150 U/L (ref 40–150)
Anion Gap: 10 mEq/L (ref 3–11)
BUN: 15.2 mg/dL (ref 7.0–26.0)
CALCIUM: 9.7 mg/dL (ref 8.4–10.4)
CHLORIDE: 104 meq/L (ref 98–109)
CO2: 25 meq/L (ref 22–29)
Creatinine: 0.8 mg/dL (ref 0.6–1.1)
EGFR: 71 mL/min/{1.73_m2} — ABNORMAL LOW (ref >90)
Glucose: 135 mg/dl (ref 70–140)
POTASSIUM: 3.7 meq/L (ref 3.5–5.1)
Sodium: 139 mEq/L (ref 136–145)
Total Bilirubin: 0.54 mg/dL (ref 0.20–1.20)
Total Protein: 7.2 g/dL (ref 6.4–8.3)

## 2016-07-13 LAB — CBC WITH DIFFERENTIAL/PLATELET
BASO%: 1 % (ref 0.0–2.0)
BASOS ABS: 0.1 10*3/uL (ref 0.0–0.1)
EOS%: 2.6 % (ref 0.0–7.0)
Eosinophils Absolute: 0.2 10*3/uL (ref 0.0–0.5)
HEMATOCRIT: 34.9 % (ref 34.8–46.6)
HGB: 12 g/dL (ref 11.6–15.9)
LYMPH%: 14.2 % (ref 14.0–49.7)
MCH: 30.3 pg (ref 25.1–34.0)
MCHC: 34.3 g/dL (ref 31.5–36.0)
MCV: 88.2 fL (ref 79.5–101.0)
MONO#: 0.5 10*3/uL (ref 0.1–0.9)
MONO%: 6.5 % (ref 0.0–14.0)
NEUT#: 5.4 10*3/uL (ref 1.5–6.5)
NEUT%: 75.7 % (ref 38.4–76.8)
Platelets: 279 10*3/uL (ref 145–400)
RBC: 3.96 10*6/uL (ref 3.70–5.45)
RDW: 13.9 % (ref 11.2–14.5)
WBC: 7.1 10*3/uL (ref 3.9–10.3)
lymph#: 1 10*3/uL (ref 0.9–3.3)

## 2016-07-13 MED ORDER — ANASTROZOLE 1 MG PO TABS: 1.0000 mg | ORAL_TABLET | Freq: Every day | ORAL | 3 refills | Status: DC

## 2016-07-13 MED ORDER — GABAPENTIN 300 MG PO CAPS: 300.0000 mg | ORAL_CAPSULE | Freq: Every day | ORAL | 4 refills | Status: DC

## 2016-07-13 NOTE — Telephone Encounter (Signed)
Gave patient avs report and appointments for April  °

## 2016-07-13 NOTE — Progress Notes (Signed)
[name] [name]  [phone] [phone]     ID: [name] DOB: Oct 07, 1945  MR#: [id number]  FUX#:[id number]  Patient Care Team: Unk Pinto, MD as PCP - General (Internal Medicine) [name], MD as Referring Physician (Orthopedic Surgery) Chauncey Cruel, MD as Consulting Physician (Oncology) Inda Castle, MD as Consulting Physician (Gastroenterology) PCP: Alesia Richards, MD GYN: SU: Rolm Bookbinder OTHER MD: Eppie Gibson, Phylliss Bob (ortho)  CHIEF COMPLAINT: Estrogen receptor positive breast cancer  CURRENT TREATMENT: Anastrozole   BREAST CANCER HISTORY: From doctor Khan's earlier note:  "[name] [name] is a 71 y.o. female with  1. A past medical history significant for anxiety GERD shingles anemia and hypertension. Patient also has a family history significant for breast cancer in a sister. Patient underwent a screening mammogram that showed a breast density category C. She was noted to have a mass in the lower inner quadrant of the left breast with spiculated margins and associated microcalcifications. This measures 10 mm. She had an ultrasound performed that showed irregular hypoechoic mass at the 9:00 position 67 cm from the nipple. This measured 5 x 4 x 5 mm. She had a core needle biopsy performed that revealed invasive lobular carcinoma, grade 2, tumor was estrogen receptor positive progesterone receptor positive HER-2/neu negative with a proliferation marker Ki-67 14%. Patient was seen by Dr. Rolm Bookbinder on 05/22/2013 to discuss surgical treatment options. Since then she underwent a left breast radioactive seed guided lumpectomy and axillary sentinel lymph node biopsy. Her final pathology did reveal a 1.8 cm, grade 1, invasive lobular carcinoma Sentinel node was negative for metastatic disease all margins were clear. Tumor again was ER positive PR positive HER-2/neu negative. She overall tolerated the  procedure well.  2. Patient underwent adjuvant radiation therapy under the care of Dr. Isidore Moos from 07/18/13 through 08/28/13."  Her subsequent history is as detailed below  INTERVAL HISTORY: [name] returns today for follow-up of her estrogen receptor positive breast cancer. She continues on tamoxifen, with fair tolerance. The hot flashes that improved but now they are becoming a little bit more noticeable. They can wake her up at night. She does not have significant problems with vaginal dryness or arthralgias/myalgias. She obtains a drug at a good price.  REVIEW OF SYSTEMS: Lenisha was found to have a left bundle branch block and atrial fibrillation and is now followed by Dr. Ellyn Hack cardiology. She is on apixaban, with no bleeding symptoms. She was found to be significantly anemic and requiring transfusion. Recall she had a colonoscopy last year. This was felt to be secondary to peptic ulcer disease. Although symptoms are now much better and her anemia has resolved. She has a fairly physical job which she does part time. She also walks her dog daily. Aside from these issues a detailed review of systems today was stable  PAST MEDICAL HISTORY: Past Medical History:  Diagnosis Date  . Allergy    Nasonex daily as needed  . Anemia   . Anxiety   . Arthritis   . Breast cancer ([place])     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  . Chronic back pain    cyst sitting on L4-5;slipped disc  . Depression    takes Cymbalta daily  . Diverticulosis   . GERD (gastroesophageal reflux disease)    takes Omeprazole daily  . Headache(784.0)    rare  . History of gastric ulcer at age 24  . History of shingles   .  Hyperlipidemia    takes Atorvastatin 3 times a week  . Hypertension    takes Cardura nightly and Verapamil daily  . Insomnia    takes Restoril nightly  . Joint pain   . Nonischemic cardiomyopathy ([place]) 07/2015   Normal coronary arteries by cath. EF was 25-30% by echo. GR2  DD - likely related to LBBB in setting of A. fib  . Paroxysmal atrial fibrillation ([place]): CHA2DS2-VASc Score - starting Eliquis 08/06/2015   This patients CHA2DS2-VASc Score and unadjusted Ischemic Stroke Rate (% per year) is equal to 4.8 % stroke rate/year from a score of 4  Above score calculated as 1 point each if present [CHF, HTN, DM, Vascular=MI/PAD/Aortic Plaque, Age if 65-74, or Female]; 2 points each if present [Age > 75, or Stroke/TIA/TE]  . PONV (postoperative nausea and vomiting)   . S/P radiation therapy 07/18/2013-08/28/2013   1) Left Breast / 50 Gy in 25 fractions/ 2) Left Breast Boost / 10 Gy in 5 fractions  . Shingles   . Sinus drainage    put on Levaquin yesterday if no better in 3 days will start prednisone  . Thyroid disease   . Weakness    tingling and numbness both hands and left leg  . Wears glasses     PAST SURGICAL HISTORY: Past Surgical History:  Procedure Laterality Date  . ABDOMINAL HYSTERECTOMY    . BREAST SURGERY     lt lump-neg  . CARDIAC CATHETERIZATION N/A 08/10/2015   Procedure: Right/Left Heart Cath and Coronary Angiography;  Surgeon: Burnell Blanks, MD;  Location: [facility];  Service: Cardiovascular;: Nonobstructive CAD  . CATARACT EXTRACTION BILATERAL W/ ANTERIOR VITRECTOMY Bilateral   . CHOLECYSTECTOMY    . COLONOSCOPY  2014  . DUPUYTREN / PALMAR FASCIOTOMY Bilateral 2007   x 3 to left and once to the right   . epidural injections     x 3  . FOOT SURGERY Right   . KNEE ARTHROSCOPY Bilateral   . LUMBAR FUSION  10/2013   Dr. Lynann Bologna  . TONSILLECTOMY    . TRANSTHORACIC ECHOCARDIOGRAM  08/07/2015   Severely reduced LVEF at 25-30% with diffuse HK. GR 2 DD. Ventricular dyssynergy secondary to LBBB. Small to moderate pericardial effusion but no hemodynamic compromise  . TRANSTHORACIC ECHOCARDIOGRAM  10/2015    (EF up from 25-30%): - Left ventricle: Poor acoustic windows -  difficult to see.    EF estimated at 45-50% with inferoseptal  and possible posterior hypokinesis. GR 1 DD. No pericardial effusion.  . TUBAL LIGATION    . UPPER GI ENDOSCOPY      FAMILY HISTORY Family History  Problem Relation Age of Onset  . Heart attack Mother   . Stroke Mother   . COPD Father   . Hypertension Father   . Colon polyps Neg Hx   . Esophageal cancer Neg Hx   . Rectal cancer Neg Hx   . Stomach cancer Neg Hx    the patient's father died at the age of 26 from uremia. The patient's mother died at the age of 16 from a heart attack. The patient has one brother, 5 sisters. One of her sisters was diagnosed with breast cancer in her 81s. There is no history of ovarian cancer in the family  GYNECOLOGIC HISTORY:  No LMP recorded. Patient has had a hysterectomy. Menarche age 13, first live birth age 59. The patient is GX P1. She had a hysterectomy with bilateral salpingo-oophorectomy in her 33s and receive  hormone replacement for approximately 30 years after that.  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 Her husband died a few years ago with severe respiratory complications. He had a tracheostomy. She was his primary caregiver. She now lives alone with her femal Educational psychologist. Her son Harrell Gave lives in [place]. He "runs the [employer]" there. The patient has one biological and 3 stepgrandchildren. She is a [employer]   ADVANCED DIRECTIVES: Not in place; at the 01/13/2014 visit the patient received the appropriate documents to declare healthcare power of attorney.   HEALTH MAINTENANCE: Social History  Substance Use Topics  . Smoking status: Never Smoker  . Smokeless tobacco: Never Used  . Alcohol use Yes     Comment: wine occasionally-rarely     Colonoscopy:  PAP:  Bone density: 01/07/2014, normal  Lipid panel:  Allergies  Allergen Reactions  . Requip [Ropinirole Hcl] Shortness Of Breath and Nausea And Vomiting  . Codeine  Other (See Comments)    fatigue  . Minocycline Hives  . Morphine Sulfate Other (See Comments)    Palpitations  . Tetracyclines & Related Hives    Pt doesn't remember a reaction  . Zestril [Lisinopril] Cough  . Zetia [Ezetimibe] Other (See Comments)    Muscle and joint pain  . Amoxicillin Rash    Pt doesn't remember a reaction  . Sulfa Antibiotics Other (See Comments)    Headache (pt states that a blood pressure medicine caused a severe headache but doesn't remember a reaction to sulfa)    Current Outpatient Prescriptions  Medication Sig Dispense Refill  . anastrozole (ARIMIDEX) 1 MG tablet Take 1 tablet (1 mg total) by mouth daily. 90 tablet 3  . apixaban (ELIQUIS) 5 MG TABS tablet Take 1 tablet (5 mg total) by mouth 2 (two) times daily. 180 tablet 3  . aspirin EC 81 MG EC tablet Take 1 tablet (81 mg total) by mouth daily.    Marland Kitchen atorvastatin (LIPITOR) 80 MG tablet Take 0.5 tablets (40 mg total) by mouth daily. 90 tablet 3  . buPROPion (WELLBUTRIN XL) 150 MG 24 hr tablet TAKE 1 TABLET (150 MG TOTAL) BY MOUTH EVERY MORNING. 90 tablet 1  . carvedilol (COREG) 12.5 MG tablet TAKE 1 TABLET 2 TIMES DAILY OR AS DIRECTED. 180 tablet 0  . Cholecalciferol (VITAMIN D) 2000 UNITS tablet Take 4,000 Units by mouth daily.    . cyclobenzaprine (FLEXERIL) 10 MG tablet Take 10 mg by mouth at bedtime.  1  . DULoxetine (CYMBALTA) 30 MG capsule Take 1 capsule (30 mg total) by mouth 3 (three) times daily. 270 capsule 3  . ferrous sulfate 325 (65 FE) MG tablet TAKE 1 TABLET (325 MG TOTAL) BY MOUTH 2 (TWO) TIMES DAILY WITH A MEAL. 180 tablet 3  . furosemide (LASIX) 20 MG tablet TAKE 1 TABLET BY MOUTH TWICE A DAY 180 tablet 1  . gabapentin (NEURONTIN) 300 MG capsule TAKE ONE CAPSULE BY MOUTH EVERY DAY AT BEDTIME 90 capsule 1  . ipratropium (ATROVENT) 0.03 % nasal spray Place 2 sprays into the nose 3 (three) times daily. 54 mL 3  . levothyroxine (SYNTHROID, LEVOTHROID) 50 MCG tablet TAKE 1/2 TO 1 TABLET ON AN  EMPTY STOMACH EVERY MORNING 30 TO 60 MIN BEFORE BREAKFAST 90 tablet 1  . meloxicam (MOBIC) 15 MG tablet TAKE 1/2 TO 1 TABLET DAILY WITH FOOD FOR PAIN *DONT TAKE WITH ALEVE OR IBUPROFEN CAN  TAKE TYLENOL* 90 tablet 1  . mometasone (NASONEX) 50 MCG/ACT nasal spray USE 1 TO 2 SPRAYS IN EACH NOSTRIL TWICE DAILY 17 g 99  . ranitidine (ZANTAC) 300 MG tablet TAKE 1 TO 2 TABLETS DAILY AS NEEDED FOR ACID INDIGESTION & REFLUX 180 tablet 1  . temazepam (RESTORIL) 30 MG capsule TAKE ONE CAPSULE BY MOUTH AT BEDTIME AS NEEDED FOR SLEEP 90 capsule 1  . traMADol (ULTRAM) 50 MG tablet TAKE 1 TABLET BY MOUTH 4 TIMES A DAY AS NEEDED 120 tablet 0  . valsartan (DIOVAN) 80 MG tablet Take 160 mg by mouth daily.     No current facility-administered medications for this visit.     OBJECTIVE: Middle-aged white woman   Vitals:   07/13/16 1022  BP: (!) 153/73  Pulse: 90  Resp: 18  Temp: 98.1 F (36.7 C)     Body mass index is 37.48 kg/m.    ECOG FS:1 - Symptomatic but completely ambulatory  Sclerae unicteric, EOMs intact Oropharynx clear and moist No cervical or supraclavicular adenopathy Lungs no rales or rhonchi Heart regular rate and rhythm Abd soft, nontender, positive bowel sounds MSK no focal spinal tenderness, no upper extremity lymphedema Neuro: nonfocal, well oriented, appropriate affect Breasts: The right breast is benign. The left breast is status post lumpectomy followed by radiation with no evidence of local recurrence. The left breast is slightly smaller, darker, and firmer than the right. Both axillae are benign.    LAB RESULTS:  CMP     Component Value Date/Time   NA 139 05/10/2016 1427   NA 140 07/13/2015 1410   K 3.7 05/10/2016 1427   K 3.9 07/13/2015 1410   CL 103 05/10/2016 1427   CO2 25 05/10/2016 1427   CO2 26 07/13/2015 1410   GLUCOSE 127 (H) 05/10/2016 1427   GLUCOSE 90 07/13/2015 1410   BUN 19 05/10/2016 1427   BUN 19.0 07/13/2015 1410   CREATININE 0.92 05/10/2016 1427     CREATININE 0.8 07/13/2015 1410   CALCIUM 9.2 05/10/2016 1427   CALCIUM 9.5 07/13/2015 1410   PROT 6.7 05/10/2016 1427   PROT 7.1 07/13/2015 1410   ALBUMIN 4.0 05/10/2016 1427   ALBUMIN 3.5 07/13/2015 1410   AST 16 05/10/2016 1427   AST 14 07/13/2015 1410   ALT 11 05/10/2016 1427   ALT 11 07/13/2015 1410   ALKPHOS 122 05/10/2016 1427   ALKPHOS 101 07/13/2015 1410   BILITOT 0.4 05/10/2016 1427   BILITOT <0.30 07/13/2015 1410   GFRNONAA 63 05/10/2016 1427   GFRAA 73 05/10/2016 1427    I No results found for: SPEP  Lab Results  Component Value Date   WBC 7.1 07/13/2016   NEUTROABS 5.4 07/13/2016   HGB 12.0 07/13/2016   HCT 34.9 07/13/2016   MCV 88.2 07/13/2016   PLT 279 07/13/2016      Chemistry      Component Value Date/Time   NA 139 05/10/2016 1427   NA 140 07/13/2015 1410   K 3.7 05/10/2016 1427   K 3.9 07/13/2015 1410   CL 103 05/10/2016 1427   CO2 25 05/10/2016 1427   CO2 26 07/13/2015 1410   BUN 19 05/10/2016 1427   BUN 19.0 07/13/2015 1410   CREATININE 0.92 05/10/2016 1427   CREATININE 0.8 07/13/2015 1410      Component Value Date/Time   CALCIUM 9.2 05/10/2016 1427   CALCIUM 9.5 07/13/2015 1410   ALKPHOS 122 05/10/2016 1427   ALKPHOS 101 07/13/2015 1410  AST 16 05/10/2016 1427   AST 14 07/13/2015 1410   ALT 11 05/10/2016 1427   ALT 11 07/13/2015 1410   BILITOT 0.4 05/10/2016 1427   BILITOT <0.30 07/13/2015 1410       Lab Results  Component Value Date   LABCA2 30 05/31/2013    No components found for: [id number]  No results for input(s): INR in the last 168 hours.  Urinalysis    Component Value Date/Time   COLORURINE YELLOW 09/24/2015 1450   APPEARANCEUR CLEAR 09/24/2015 1450   LABSPEC 1.010 09/24/2015 1450   PHURINE 6.0 09/24/2015 1450   GLUCOSEU NEGATIVE 09/24/2015 1450   HGBUR NEGATIVE 09/24/2015 1450   BILIRUBINUR NEGATIVE 09/24/2015 1450   KETONESUR NEGATIVE 09/24/2015 1450   PROTEINUR NEGATIVE 09/24/2015 1450   UROBILINOGEN  0.2 06/16/2014 1523   NITRITE NEGATIVE 09/24/2015 1450   LEUKOCYTESUR 1+ (A) 09/24/2015 1450    STUDIES: Mammography at the [facility] [address] 05/30/2016 and the breast density to be category B. There was no evidence of malignancy.  ASSESSMENT: 71 y.o. [place] woman  (1) status post left lumpectomy and sentinel lymph node sampling 06/06/2013 for a pT1c pN0, stage IA invasive lobular carcinoma, grade 1, estrogen receptor and progesterone receptor strongly positive, with no HER-2 amplification, and an MIB-1 of 14%.  (2) Oncotype DX score of 11 predicted a 10 year risk of outside the breast recurrence of 8% of the patient's only local treatment was tamoxifen for 5 years. It also predicted no benefit from chemotherapy.  (3) adjuvant radiation to the left breast completed 08/28/2013.  (4) started anastrozole June 2015; bone density scan September 2015 was normal (T score positive)  (5) status post back injury requiring posterior lumbar fusion at the L4-5 level  (6) status post remote hysterectomy with bilateral salpingo-oophorectomy  PLAN: Hilma is now a little over 3 years out from definitive surgery for her breast cancer with no evidence of disease recurrence. This is very favorable.  She is tolerating the anastrozole well, aside from the hot flashes. She was prescribed gabapentin to take at bedtime. She is actually taking it in the morning and we reviewed that today. The point of the gabapentin of course is to decrease the nighttime hot flashes. She understands it could cause her to be confused her sleepy during the day  We also reviewed the fact that her breasts are not dense and therefore her mammograms are very informative.  She will return to see me in one year. She likely will "graduate" from follow-up here in 2 years  She knows to call for any other problems that may develop before the return visit. Chauncey Cruel, MD   07/13/2016 10:26 AM

## 2016-07-31 ENCOUNTER — Other Ambulatory Visit: Payer: Self-pay | Admitting: Internal Medicine

## 2016-08-09 ENCOUNTER — Telehealth: Payer: Self-pay | Admitting: *Deleted

## 2016-08-09 NOTE — Telephone Encounter (Signed)
FAXED  CLEARANCE REQUEST  FORM  FOR LEFT  TOTAL KNEE ARTHOPLASTY    PER DR HARDING   LOW RISK PATIENT ,LOW RISK SURGERY  OKAY TO TO HOLD ELIQUIS 5 MG  FOR 2 DAYS PRIOR TO SURGERY  AND RESTART WHEN STABLE POST OP SURGERY

## 2016-08-09 NOTE — Progress Notes (Signed)
MEDICARE ANNUAL WELLNESS VISIT AND 3 month OV  Assessment:   Essential hypertension - continue medications, DASH diet, exercise and monitor at home. Call if greater than 130/80.   -     CBC with Differential/Platelet -     BASIC METABOLIC PANEL WITH GFR -     Hepatic function panel -     TSH  Paroxysmal atrial fibrillation ([place]): CHA2DS2-VASc Score - starting Eliquis Control blood pressure, cholesterol, glucose, increase exercise.  Continue cardio follow up  Chronic combined systolic and diastolic heart failure, NYHA class 2 ([place]) -- exacerbated by A. Fib Weight stable Optimize meds  Non-ischemic cardiomyopathy (HCC) Weight stable Optimize meds  Paroxysmal supraventricular tachycardia (HCC) Control blood pressure, cholesterol, glucose, increase exercise.  Continue cardio follow up  Non-seasonal allergic rhinitis, unspecified trigger Allergic rhinitis - Allegra OTC, increase H20, allergy hygiene explained.  Gastroesophageal reflux disease without esophagitis Continue PPI/H2 blocker, diet discussed  Other specified hypothyroidism Hypothyroidism-check TSH level, continue medications the same, reminded to take on an empty stomach 30-58mins before food.  -     TSH  Lumbar radiculopathy Follow up ortho  DDD (degenerative disc disease), lumbar Follow up ortho  Hyperlipidemia -continue medications, check lipids, decrease fatty foods, increase activity.  -     Lipid panel  H/O left bundle branch block Continue cardio follow up  ANXIETY DEPRESSION Continue meds  History of pancreatitis Monitor, no ETOH  Malignant neoplasm of lower-inner quadrant of left breast in female, estrogen receptor positive ([place]) Continue follow up oncology, continue meds  Morbid Obesity with co morbidities - long discussion about weight loss, diet, and exercise  Prediabetes Discussed general issues about diabetes pathophysiology and management., Educational material distributed.,  Suggested low cholesterol diet., Encouraged aerobic exercise., Discussed foot care., Reminded to get yearly retinal exam. -     Hemoglobin A1c  Vitamin D deficiency  Medication management -     Magnesium  Encounter for Medicare annual wellness exam  History of ulcer disease monitor  Anemia, unspecified type -     Iron and TIBC -     Vitamin B12  Fatigue, unspecified type Crowded mouth, dicussed OSA but declines study at this time -     TSH -     Iron and TIBC -     Vitamin B12   Future Appointments Date Time Provider [place]  08/10/2016 2:30 PM Vicie Mutters, PA-C [facility] None  11/24/2016 10:00 AM Vicie Mutters, PA-C [facility] None  07/13/2017 1:00 PM [facility] None  07/13/2017 1:30 PM Chauncey Cruel, MD [facility] None     Plan:   During the course of the visit the patient was educated and counseled about appropriate screening and preventive services including:    Pneumococcal vaccine   Influenza vaccine  Td vaccine  Screening electrocardiogram  Bone densitometry screening  Colorectal cancer screening  Diabetes screening  Glaucoma screening  Nutrition counseling   Advanced directives: requested   Subjective:  [name] [name] is a 71 y.o. female who presents for Medicare Annual Wellness Visit and 3 month follow up.  She has had elevated blood pressure for  years. Her blood pressure has been controlled at home, today their BP is BP: 128/88 She does not workout regularly but does walk with her dogShe denies chest pain, shortness of breath, dizziness.  She has afib, on eliquis, follows with cardio. Has never had sleep study. She is on cholesterol medication, lipitor 80mg  1/2 pill every day and denies myalgias. Her cholesterol  is at goal. The cholesterol last visit was:   Lab Results  Component Value Date   CHOL 175 05/10/2016   HDL 45 (L) 05/10/2016   LDLCALC 94 05/10/2016   TRIG 179 (H) 05/10/2016   CHOLHDL 3.9  05/10/2016   She has been working on diet and exercise for prediabetes, and denies paresthesia of the feet, polydipsia, polyuria and visual disturbances. Last A1C in the office was:  Lab Results  Component Value Date   HGBA1C 6.0 (H) 05/10/2016  Patient is on Vitamin D supplement.   Lab Results  Component Value Date   VD25OH 15 12/31/2015   Follows with Dr. [name] for lower back surgery, she states it has improved but still has some muscular pain, no pain down her legs. She also has bilateral knee pain, left is worse than right and states she will follow up with Dr. [name], may need surgery, has done PT She is on thyroid medication. Her medication was not changed last visit.   Lab Results  Component Value Date   TSH 0.88 05/10/2016  4 years since husband has passed, still has some depression from this, worse around his bday/death date. On cymbalta and handling well.  Follows with Dr. Griffith Citron for left breast cancer, had lumpectomy and radiation.  BMI is Body mass index is 37.41 kg/m., she is working on diet and exercise. Wt Readings from Last 3 Encounters:  08/10/16 224 lb 12.8 oz (102 kg)  07/13/16 225 lb 3.2 oz (102.2 kg)  05/25/16 226 lb (102.5 kg)     Medication Review: Current Outpatient Prescriptions on File Prior to Visit  Medication Sig Dispense Refill  . anastrozole (ARIMIDEX) 1 MG tablet Take 1 tablet (1 mg total) by mouth daily. 90 tablet 3  . apixaban (ELIQUIS) 5 MG TABS tablet Take 1 tablet (5 mg total) by mouth 2 (two) times daily. 180 tablet 3  . atorvastatin (LIPITOR) 80 MG tablet Take 0.5 tablets (40 mg total) by mouth daily. 90 tablet 3  . buPROPion (WELLBUTRIN XL) 150 MG 24 hr tablet TAKE 1 TABLET (150 MG TOTAL) BY MOUTH EVERY MORNING. 90 tablet 1  . carvedilol (COREG) 12.5 MG tablet TAKE 1 TABLET 2 TIMES DAILY OR AS DIRECTED. 180 tablet 0  . Cholecalciferol (VITAMIN D) 2000 UNITS tablet Take 4,000 Units by mouth daily.    . cyclobenzaprine (FLEXERIL) 10 MG tablet  Take 10 mg by mouth at bedtime.  1  . DULoxetine (CYMBALTA) 30 MG capsule Take 1 capsule (30 mg total) by mouth 3 (three) times daily. 270 capsule 3  . ferrous sulfate 325 (65 FE) MG tablet TAKE 1 TABLET (325 MG TOTAL) BY MOUTH 2 (TWO) TIMES DAILY WITH A MEAL. 180 tablet 3  . furosemide (LASIX) 20 MG tablet TAKE 1 TABLET BY MOUTH TWICE A DAY 180 tablet 1  . gabapentin (NEURONTIN) 300 MG capsule TAKE ONE CAPSULE BY MOUTH EVERY DAY AT BEDTIME 90 capsule 1  . gabapentin (NEURONTIN) 300 MG capsule Take 1 capsule (300 mg total) by mouth at bedtime. 90 capsule 4  . ipratropium (ATROVENT) 0.03 % nasal spray Place 2 sprays into the nose 3 (three) times daily. 54 mL 3  . levothyroxine (SYNTHROID, LEVOTHROID) 50 MCG tablet TAKE 1/2 TO 1 TABLET ON AN EMPTY STOMACH EVERY MORNING 30 TO 60 MIN BEFORE BREAKFAST 90 tablet 1  . meloxicam (MOBIC) 15 MG tablet TAKE 1/2 TO 1 TABLET DAILY WITH FOOD FOR PAIN *DONT TAKE WITH ALEVE OR IBUPROFEN CAN TAKE TYLENOL* 90  tablet 1  . mometasone (NASONEX) 50 MCG/ACT nasal spray USE 1 TO 2 SPRAYS IN EACH NOSTRIL TWICE DAILY 17 g 99  . ranitidine (ZANTAC) 300 MG tablet TAKE 1 TO 2 TABLETS DAILY AS NEEDED FOR ACID INDIGESTION & REFLUX 180 tablet 1  . temazepam (RESTORIL) 30 MG capsule TAKE ONE CAPSULE BY MOUTH AT BEDTIME AS NEEDED FOR SLEEP 90 capsule 1  . traMADol (ULTRAM) 50 MG tablet TAKE 1 TABLET BY MOUTH 4 TIMES A DAY AS NEEDED 120 tablet 0  . valsartan (DIOVAN) 80 MG tablet Take 160 mg by mouth daily.     No current facility-administered medications on file prior to visit.     Current Problems (verified) Patient Active Problem List   Diagnosis Date Noted  . Medication dose changed 12/09/2015  . Non-ischemic cardiomyopathy ([place])   . Chronic combined systolic and diastolic heart failure, NYHA class 2 ([place]) -- exacerbated by A. fib 08/08/2015  . Paroxysmal atrial fibrillation [facility]): CHA2DS2-VASc Score - starting Eliquis 08/06/2015  . Hypothyroidism 08/06/2015  . History  of ulcer disease 08/06/2015  . Absolute anemia   . Encounter for Medicare annual wellness exam 03/31/2015  . DDD (degenerative disc disease), lumbar 06/16/2014  . Prediabetes 12/18/2013  . Vitamin D deficiency 12/18/2013  . Medication management 12/18/2013  . Paroxysmal supraventricular tachycardia ([place]) 10/18/2013  . Radiculopathy 10/16/2013  . Obesity (BMI 30-39.9) 06/11/2013  . Malignant neoplasm of lower-inner quadrant of left breast in female, estrogen receptor positive ([place]) 05/20/2013  . History of pancreatitis 03/27/2008  . Hyperlipidemia 03/13/2008  . ANXIETY DEPRESSION 03/13/2008  . Essential hypertension 03/13/2008  . H/O left bundle branch block 03/13/2008  . Allergic rhinitis 03/13/2008  . GERD 03/13/2008    Screening Tests Immunization History  Administered Date(s) Administered  . Influenza Whole 01/01/2013  . Influenza, High Dose Seasonal PF 01/29/2014, 12/30/2014  . Influenza-Unspecified 12/11/2015  . Pneumococcal Conjugate-13 06/16/2014  . Pneumococcal Polysaccharide-23 05/23/2011  . Tdap 03/11/2008  . Zoster 02/02/2010   Preventative care: Last colonoscopy: 2014 Last mammogram: 05/2016  (05/06/2013 + right breast cancer s/p lumpectomy) Last pap smear/pelvic exam: remote   DEXA: 12/2013- normal Renal US 08/2015 Echo 2008  Prior vaccinations: TD or Tdap: 2009  Influenza: 2017 Pneumococcal: 2013 Prevnar 13: 2016 Shingles/Zostavax: 2011  Names of Other Physician/Practitioners you currently use: 1. [facility] here for primary care 2. Dr [facility], eye doctor, last visit April 2015 3. Dr. Tye Savoy, dentist, last visit q 6 months Patient Care Team: Unk Pinto, MD as PCP - General (Internal Medicine) Chauncey Cruel, MD as Consulting Physician (Oncology) Inda Castle, MD as Consulting Physician (Gastroenterology) Leonie Man, MD as Consulting Physician (Cardiology) Dorna Leitz, MD as  Consulting Physician (Orthopedic Surgery)  Allergies Allergies  Allergen Reactions  . Requip [Ropinirole Hcl] Shortness Of Breath and Nausea And Vomiting  . Codeine Other (See Comments)    fatigue  . Minocycline Hives  . Morphine Sulfate Other (See Comments)    Palpitations  . Tetracyclines & Related Hives    Pt doesn't remember a reaction  . Zestril [Lisinopril] Cough  . Zetia [Ezetimibe] Other (See Comments)    Muscle and joint pain  . Amoxicillin Rash    Pt doesn't remember a reaction  . Sulfa Antibiotics Other (See Comments)    Headache (pt states that a blood pressure medicine caused a severe headache but doesn't remember a reaction to sulfa)    SURGICAL HISTORY She  has a past  surgical history that includes Cataract extraction bilateral w/ anterior vitrectomy (Bilateral); Abdominal hysterectomy; Cholecystectomy; Knee arthroscopy (Bilateral); Tonsillectomy; Colonoscopy (2014); Upper gi endoscopy; Breast surgery; Dupuytren / palmar fasciotomy (Bilateral, 2007); Tubal ligation; Foot surgery (Right); epidural injections; Lumbar fusion (10/2013); Cardiac catheterization (N/A, 08/10/2015); transthoracic echocardiogram (08/07/2015); and transthoracic echocardiogram (10/2015). FAMILY HISTORY Her family history includes COPD in her father; Heart attack in her mother; Hypertension in her father; Stroke in her mother. SOCIAL HISTORY She  reports that she has never smoked. She has never used smokeless tobacco. She reports that she drinks alcohol. She reports that she does not use drugs.  MEDICARE WELLNESS OBJECTIVES: Tobacco use: She does not smoke.  Patient is not a former smoker. Alcohol Current alcohol use: social drinker Caffeine Current caffeine use: denies use Diet: in general, a "healthy" diet   Physical activity: walking and no regular exercise Fall risk: has  Low Risk Osteoporosis: postmenopausal estrogen deficiency and dietary calcium and/or vitamin D deficiency, History of  fracture in the past year: no Depression/mood screen:  Yes - Depression but on cymbalta ADLs: self care Home safety: excellent Cognitive Testing  Alert? Yes  Normal Appearance?Yes  Oriented to person? Yes  Place? Yes   Time? Yes  Recall of three objects?  Yes  Can perform simple calculations? Yes  Displays appropriate judgment?Yes  Can read the correct time from a watch face?Yes EOL planning: No  and Information given    Objective:     Blood pressure 128/88, pulse 70, temperature 97.3 F (36.3 C), resp. rate 16, height 5\' 5"  (1.651 m), weight 224 lb 12.8 oz (102 kg), SpO2 97 %. Body mass index is 37.41 kg/m.  General appearance: alert, no distress, WD/WN, female HEENT: normocephalic, sclerae anicteric, TMs pearly, nares patent, no discharge or erythema, pharynx normal, + maxillary sinus tenderness to palpation.  Oral cavity: MMM, no lesions Neck: supple, no lymphadenopathy, no thyromegaly, no masses Heart: RRR, normal S1, S2, no murmurs Lungs: CTA bilaterally, no wheezes, rhonchi, or rales Abdomen: +bs, soft, obese, + epigastric tenderness, non distended, no masses, no hepatomegaly, no splenomegaly Musculoskeletal: nontender, no swelling, no obvious deformity Extremities: no edema, no cyanosis, no clubbing Pulses: 2+ symmetric, upper and lower extremities, normal cap refill Neurological: alert, oriented x 3, CN2-12 intact, strength normal upper extremities and lower extremities, sensation normal throughout, DTRs 2+ throughout, no cerebellar signs, gait antalgic Psychiatric: normal affect, behavior normal, pleasant   Medicare Attestation I have personally reviewed: The patient's medical and social history Their use of alcohol, tobacco or illicit drugs Their current medications and supplements The patient's functional ability including ADLs,fall risks, home safety risks, cognitive, and hearing and visual impairment Diet and physical activities Evidence for depression or mood  disorders  The patient's weight, height, BMI, and visual acuity have been recorded in the chart.  I have made referrals, counseling, and provided education to the patient based on review of the above and I have provided the patient with a written personalized care plan for preventive services.     Vicie Mutters, PA-C   08/10/2016

## 2016-08-10 ENCOUNTER — Encounter: Payer: Self-pay | Admitting: Physician Assistant

## 2016-08-10 ENCOUNTER — Ambulatory Visit (INDEPENDENT_AMBULATORY_CARE_PROVIDER_SITE_OTHER): Payer: Medicare Other | Admitting: Physician Assistant

## 2016-08-10 VITALS — BP 128/88 | HR 70 | Temp 97.3°F | Resp 16 | Ht 65.0 in | Wt 224.8 lb

## 2016-08-10 DIAGNOSIS — Z79899 Other long term (current) drug therapy: Secondary | ICD-10-CM

## 2016-08-10 DIAGNOSIS — Z0001 Encounter for general adult medical examination with abnormal findings: Secondary | ICD-10-CM

## 2016-08-10 DIAGNOSIS — R5383 Other fatigue: Secondary | ICD-10-CM

## 2016-08-10 DIAGNOSIS — Z170 Estrogen receptor positive status [ER+]: Secondary | ICD-10-CM

## 2016-08-10 DIAGNOSIS — R7303 Prediabetes: Secondary | ICD-10-CM

## 2016-08-10 DIAGNOSIS — R6889 Other general symptoms and signs: Secondary | ICD-10-CM

## 2016-08-10 DIAGNOSIS — K219 Gastro-esophageal reflux disease without esophagitis: Secondary | ICD-10-CM

## 2016-08-10 DIAGNOSIS — M51369 Other intervertebral disc degeneration, lumbar region without mention of lumbar back pain or lower extremity pain: Secondary | ICD-10-CM

## 2016-08-10 DIAGNOSIS — D649 Anemia, unspecified: Secondary | ICD-10-CM

## 2016-08-10 DIAGNOSIS — E559 Vitamin D deficiency, unspecified: Secondary | ICD-10-CM

## 2016-08-10 DIAGNOSIS — Z8679 Personal history of other diseases of the circulatory system: Secondary | ICD-10-CM | POA: Diagnosis not present

## 2016-08-10 DIAGNOSIS — M5416 Radiculopathy, lumbar region: Secondary | ICD-10-CM

## 2016-08-10 DIAGNOSIS — M5136 Other intervertebral disc degeneration, lumbar region: Secondary | ICD-10-CM

## 2016-08-10 DIAGNOSIS — I10 Essential (primary) hypertension: Secondary | ICD-10-CM | POA: Diagnosis not present

## 2016-08-10 DIAGNOSIS — E669 Obesity, unspecified: Secondary | ICD-10-CM

## 2016-08-10 DIAGNOSIS — E782 Mixed hyperlipidemia: Secondary | ICD-10-CM | POA: Diagnosis not present

## 2016-08-10 DIAGNOSIS — J3089 Other allergic rhinitis: Secondary | ICD-10-CM | POA: Diagnosis not present

## 2016-08-10 DIAGNOSIS — I5042 Chronic combined systolic (congestive) and diastolic (congestive) heart failure: Secondary | ICD-10-CM | POA: Diagnosis not present

## 2016-08-10 DIAGNOSIS — I480 Paroxysmal atrial fibrillation: Secondary | ICD-10-CM

## 2016-08-10 DIAGNOSIS — Z8719 Personal history of other diseases of the digestive system: Secondary | ICD-10-CM

## 2016-08-10 DIAGNOSIS — F341 Dysthymic disorder: Secondary | ICD-10-CM

## 2016-08-10 DIAGNOSIS — Z87898 Personal history of other specified conditions: Secondary | ICD-10-CM

## 2016-08-10 DIAGNOSIS — I471 Supraventricular tachycardia, unspecified: Secondary | ICD-10-CM

## 2016-08-10 DIAGNOSIS — C50312 Malignant neoplasm of lower-inner quadrant of left female breast: Secondary | ICD-10-CM

## 2016-08-10 DIAGNOSIS — E038 Other specified hypothyroidism: Secondary | ICD-10-CM

## 2016-08-10 DIAGNOSIS — I428 Other cardiomyopathies: Secondary | ICD-10-CM

## 2016-08-10 DIAGNOSIS — Z0000 Encounter for general adult medical examination without abnormal findings: Secondary | ICD-10-CM

## 2016-08-10 LAB — TSH: TSH: 1.9 m[IU]/L

## 2016-08-10 LAB — CBC WITH DIFFERENTIAL/PLATELET
BASOS ABS: 0 {cells}/uL (ref 0–200)
Basophils Relative: 0 %
EOS PCT: 2 %
Eosinophils Absolute: 202 cells/uL (ref 15–500)
HCT: 34.3 % — ABNORMAL LOW (ref 35.0–45.0)
Hemoglobin: 11.1 g/dL — ABNORMAL LOW (ref 11.7–15.5)
LYMPHS PCT: 22 %
Lymphs Abs: 2222 cells/uL (ref 850–3900)
MCH: 29.1 pg (ref 27.0–33.0)
MCHC: 32.4 g/dL (ref 32.0–36.0)
MCV: 90 fL (ref 80.0–100.0)
MONOS PCT: 7 %
MPV: 10.3 fL (ref 7.5–12.5)
Monocytes Absolute: 707 cells/uL (ref 200–950)
NEUTROS PCT: 69 %
Neutro Abs: 6969 cells/uL (ref 1500–7800)
PLATELETS: 319 10*3/uL (ref 140–400)
RBC: 3.81 MIL/uL (ref 3.80–5.10)
RDW: 14.2 % (ref 11.0–15.0)
WBC: 10.1 10*3/uL (ref 3.8–10.8)

## 2016-08-10 NOTE — Patient Instructions (Signed)
I think it is possible that you have sleep apnea. It can cause interrupted sleep, headaches, frequent awakenings, fatigue, dry mouth, fast/slow heart beats, memory issues, anxiety/depression, swelling, numbness tingling hands/feet, weight gain, shortness of breath, and the list goes on. Sleep apnea needs to be ruled out because if it is left untreated it does eventually lead to abnormal heart beats, lung failure or heart failure as well as increasing the risk of heart attack and stroke. There are masks you can wear OR a mouth piece that I can give you information about. Often times though people feel MUCH better after getting treatment.   Sleep Apnea  Sleep apnea is a sleep disorder characterized by abnormal pauses in breathing while you sleep. When your breathing pauses, the level of oxygen in your blood decreases. This causes you to move out of deep sleep and into light sleep. As a result, your quality of sleep is poor, and the system that carries your blood throughout your body (cardiovascular system) experiences stress. If sleep apnea remains untreated, the following conditions can develop:  High blood pressure (hypertension).  Coronary artery disease.  Inability to achieve or maintain an erection (impotence).  Impairment of your thought process (cognitive dysfunction). There are three types of sleep apnea: 1. Obstructive sleep apnea--Pauses in breathing during sleep because of a blocked airway. 2. Central sleep apnea--Pauses in breathing during sleep because the area of the brain that controls your breathing does not send the correct signals to the muscles that control breathing. 3. Mixed sleep apnea--A combination of both obstructive and central sleep apnea.  RISK FACTORS The following risk factors can increase your risk of developing sleep apnea:  Being overweight.  Smoking.  Having narrow passages in your nose and throat.  Being of older age.  Being female.  Alcohol use.   Sedative and tranquilizer use.  Ethnicity. Among individuals younger than 35 years, African Americans are at increased risk of sleep apnea. SYMPTOMS   Difficulty staying asleep.  Daytime sleepiness and fatigue.  Loss of energy.  Irritability.  Loud, heavy snoring.  Morning headaches.  Trouble concentrating.  Forgetfulness.  Decreased interest in sex. DIAGNOSIS  In order to diagnose sleep apnea, your caregiver will perform a physical examination. Your caregiver may suggest that you take a home sleep test. Your caregiver may also recommend that you spend the night in a sleep lab. In the sleep lab, several monitors record information about your heart, lungs, and brain while you sleep. Your leg and arm movements and blood oxygen level are also recorded. TREATMENT The following actions may help to resolve mild sleep apnea:  Sleeping on your side.   Using a decongestant if you have nasal congestion.   Avoiding the use of depressants, including alcohol, sedatives, and narcotics.   Losing weight and modifying your diet if you are overweight. There also are devices and treatments to help open your airway:  Oral appliances. These are custom-made mouthpieces that shift your lower jaw forward and slightly open your bite. This opens your airway.  Devices that create positive airway pressure. This positive pressure "splints" your airway open to help you breathe better during sleep. The following devices create positive airway pressure:  Continuous positive airway pressure (CPAP) device. The CPAP device creates a continuous level of air pressure with an air pump. The air is delivered to your airway through a mask while you sleep. This continuous pressure keeps your airway open.  Nasal expiratory positive airway pressure (EPAP) device. The EPAP device  creates positive air pressure as you exhale. The device consists of single-use valves, which are inserted into each nostril and held in  place by adhesive. The valves create very little resistance when you inhale but create much more resistance when you exhale. That increased resistance creates the positive airway pressure. This positive pressure while you exhale keeps your airway open, making it easier to breath when you inhale again.  Bilevel positive airway pressure (BPAP) device. The BPAP device is used mainly in patients with central sleep apnea. This device is similar to the CPAP device because it also uses an air pump to deliver continuous air pressure through a mask. However, with the BPAP machine, the pressure is set at two different levels. The pressure when you exhale is lower than the pressure when you inhale.  Surgery. Typically, surgery is only done if you cannot comply with less invasive treatments or if the less invasive treatments do not improve your condition. Surgery involves removing excess tissue in your airway to create a wider passage way. Document Released: 03/18/2002 Document Revised: 07/23/2012 Document Reviewed: 08/04/2011 [facility] Patient Information 2015 [place], [place]. This information is not intended to replace advice given to you by your health care provider. Make sure you discuss any questions you have with your health care provider.      Simple math prevails.    1st - exercise does not produce significant weight loss - at best one converts fat into muscle , "bulks up", loses inches, but usually stays "weight neutral"     2nd - think of your body weightas a check book: If you eat more calories than you burn up - you save money or gain weight .... Or if you spend more money than you put in the check book, ie burn up more calories than you eat, then you lose weight     3rd - if you walk or run 1 mile, you burn up 100 calories - you have to burn up 3,500 calories to lose 1 pound, ie you have to walk/run 35 miles to lose 1 measly pound. So if you want to lose 10 #, then you have to walk/run 350 miles,  so.... clearly exercise is not the solution.     4. So if you consume 1,500 calories, then you have to burn up the equivalent of 15 miles to stay weight neutral - It also stands to reason that if you consume 1,500 cal/day and don't lose weight, then you must be burning up about 1,500 cals/day to stay weight neutral.     5. If you really want to lose weight, you must cut your calorie intake 300 calories /day and at that rate you should lose about 1 # every 3 days.   6. Please purchase Dr Fara Olden Fuhrman's book(s) "The End of Dieting" & "Eat to Live" . It has some great concepts and recipes.

## 2016-08-11 LAB — HEPATIC FUNCTION PANEL
ALT: 15 U/L (ref 6–29)
AST: 14 U/L (ref 10–35)
Albumin: 3.8 g/dL (ref 3.6–5.1)
Alkaline Phosphatase: 104 U/L (ref 33–130)
BILIRUBIN DIRECT: 0.1 mg/dL (ref <=0.2)
BILIRUBIN TOTAL: 0.3 mg/dL (ref 0.2–1.2)
Indirect Bilirubin: 0.2 mg/dL (ref 0.2–1.2)
Total Protein: 6.5 g/dL (ref 6.1–8.1)

## 2016-08-11 LAB — BASIC METABOLIC PANEL WITH GFR
BUN: 21 mg/dL (ref 7–25)
CALCIUM: 8.9 mg/dL (ref 8.6–10.4)
CO2: 24 mmol/L (ref 20–31)
CREATININE: 0.93 mg/dL (ref 0.60–0.93)
Chloride: 101 mmol/L (ref 98–110)
GFR, Est African American: 72 mL/min (ref >=60)
GFR, Est Non African American: 62 mL/min (ref >=60)
Glucose, Bld: 94 mg/dL (ref 65–99)
Potassium: 4.3 mmol/L (ref 3.5–5.3)
SODIUM: 138 mmol/L (ref 135–146)

## 2016-08-11 LAB — HEMOGLOBIN A1C
HEMOGLOBIN A1C: 6 % — AB (ref <5.7)
MEAN PLASMA GLUCOSE: 126 mg/dL

## 2016-08-11 LAB — IRON AND TIBC
%SAT: 15 % (ref 11–50)
Iron: 52 ug/dL (ref 45–160)
TIBC: 343 ug/dL (ref 250–450)
UIBC: 291 ug/dL (ref 125–400)

## 2016-08-11 LAB — LIPID PANEL
CHOL/HDL RATIO: 3.6 ratio (ref <5.0)
CHOLESTEROL: 168 mg/dL (ref <200)
HDL: 47 mg/dL — ABNORMAL LOW (ref >50)
LDL Cholesterol: 82 mg/dL (ref <100)
Triglycerides: 194 mg/dL — ABNORMAL HIGH (ref <150)
VLDL: 39 mg/dL — ABNORMAL HIGH (ref <30)

## 2016-08-11 LAB — VITAMIN B12: Vitamin B-12: 314 pg/mL (ref 200–1100)

## 2016-08-11 LAB — MAGNESIUM: MAGNESIUM: 2 mg/dL (ref 1.5–2.5)

## 2016-08-15 ENCOUNTER — Encounter: Payer: Self-pay | Admitting: Physician Assistant

## 2016-08-15 ENCOUNTER — Telehealth: Payer: Self-pay | Admitting: Physician Assistant

## 2016-08-15 MED ORDER — PREDNISONE 20 MG PO TABS: ORAL_TABLET | ORAL | 0 refills | Status: DC

## 2016-08-15 MED ORDER — AZITHROMYCIN 250 MG PO TABS: ORAL_TABLET | ORAL | 1 refills | Status: AC

## 2016-08-15 NOTE — Telephone Encounter (Signed)
71 y.o. WF calls the office with fever, sinus drainage, sore throat, ouchg with yellow mucus x Thursday (5 days) Suggest making sure she is on allergy pill, flonase, do prednisone x 2-3 days, if not better can take zpak but hold off on zpak to see if just allergies, they have been bad.

## 2016-08-16 ENCOUNTER — Other Ambulatory Visit: Payer: Self-pay | Admitting: Orthopedic Surgery

## 2016-08-29 ENCOUNTER — Other Ambulatory Visit: Payer: Self-pay | Admitting: Internal Medicine

## 2016-08-29 NOTE — Telephone Encounter (Signed)
Please call Temazepam 

## 2016-09-09 NOTE — Pre-Procedure Instructions (Signed)
    [name] [name]  09/09/2016      [id number] - [place], [place] - [address] AT [place]. [place] [address] Phone: [phone] Fax: [place] [address], [place] - [address] [place] [place] [address] Phone: [phone] Fax: [place], [place] [facility] [address] [place] [address] [place] [address] Phone: [phone] Fax: [phone]    Your procedure is scheduled on 09/23/16.  Report to [facility] Admitting at 530 A.M.  Call this number if you have problems the morning of surgery:  [phone]   Remember:  Do not eat food or drink liquids after midnight.  Take these medicines the morning of surgery with A SIP OF WATER --arimidex,carvedilol,cymbalta,neurontin,synthroid,zantac,ultram   Do not wear jewelry, make-up or nail polish.  Do not wear lotions, powders, or perfumes, or deoderant.  Do not shave 48 hours prior to surgery.  Men may shave face and neck.  Do not bring valuables to the hospital.  [facility] is not responsible for any belongings or valuables.  Contacts, dentures or bridgework may not be worn into surgery.  Leave your suitcase in the car.  After surgery it may be brought to your room.  For patients admitted to the hospital, discharge time will be determined by your treatment team.  Patients discharged the day of surgery will not be allowed to drive home.   Name and phone number of your driver:    Special instructions:  Do not take any aspirin,anti-inflammatories,vitamins,or herbal supplements 5-7 days prior to surgery.  Please read over the following fact sheets that you were given. MRSA Information

## 2016-09-12 ENCOUNTER — Telehealth: Payer: Self-pay | Admitting: Cardiology

## 2016-09-12 ENCOUNTER — Encounter (HOSPITAL_COMMUNITY): Payer: Self-pay

## 2016-09-12 ENCOUNTER — Ambulatory Visit (HOSPITAL_COMMUNITY)
Admission: RE | Admit: 2016-09-12 | Discharge: 2016-09-12 | Disposition: A | Discharge status: Home / Self Care | Payer: Medicare Other | Source: Ambulatory Visit | Attending: Orthopedic Surgery | Admitting: Orthopedic Surgery

## 2016-09-12 ENCOUNTER — Encounter (HOSPITAL_COMMUNITY)
Admission: RE | Admit: 2016-09-12 | Discharge: 2016-09-12 | Disposition: A | Discharge status: Home / Self Care | Payer: Medicare Other | Source: Ambulatory Visit | Attending: Orthopedic Surgery | Admitting: Orthopedic Surgery

## 2016-09-12 DIAGNOSIS — M1712 Unilateral primary osteoarthritis, left knee: Secondary | ICD-10-CM | POA: Insufficient documentation

## 2016-09-12 DIAGNOSIS — R918 Other nonspecific abnormal finding of lung field: Secondary | ICD-10-CM | POA: Diagnosis not present

## 2016-09-12 DIAGNOSIS — J9811 Atelectasis: Secondary | ICD-10-CM | POA: Insufficient documentation

## 2016-09-12 DIAGNOSIS — Z01818 Encounter for other preprocedural examination: Secondary | ICD-10-CM | POA: Diagnosis not present

## 2016-09-12 HISTORY — DX: Hypothyroidism, unspecified: E03.9

## 2016-09-12 HISTORY — DX: Cardiac arrhythmia, unspecified: I49.9

## 2016-09-12 LAB — URINALYSIS, ROUTINE W REFLEX MICROSCOPIC
BILIRUBIN URINE: NEGATIVE
Glucose, UA: NEGATIVE mg/dL
HGB URINE DIPSTICK: NEGATIVE
KETONES UR: NEGATIVE mg/dL
Nitrite: NEGATIVE
PROTEIN: NEGATIVE mg/dL
Specific Gravity, Urine: 1.019 (ref 1.005–1.030)
pH: 5 (ref 5.0–8.0)

## 2016-09-12 LAB — COMPREHENSIVE METABOLIC PANEL
ALK PHOS: 112 U/L (ref 38–126)
ALT: 19 U/L (ref 14–54)
ANION GAP: 11 (ref 5–15)
AST: 22 U/L (ref 15–41)
Albumin: 3.9 g/dL (ref 3.5–5.0)
BILIRUBIN TOTAL: 0.4 mg/dL (ref 0.3–1.2)
BUN: 20 mg/dL (ref 6–20)
CALCIUM: 9.4 mg/dL (ref 8.9–10.3)
CO2: 25 mmol/L (ref 22–32)
Chloride: 103 mmol/L (ref 101–111)
Creatinine, Ser: 1 mg/dL (ref 0.44–1.00)
GFR calc non Af Amer: 56 mL/min — ABNORMAL LOW (ref >60)
Glucose, Bld: 93 mg/dL (ref 65–99)
POTASSIUM: 3.7 mmol/L (ref 3.5–5.1)
SODIUM: 139 mmol/L (ref 135–145)
TOTAL PROTEIN: 6.8 g/dL (ref 6.5–8.1)

## 2016-09-12 LAB — TYPE AND SCREEN
ABO/RH(D): A POS
Antibody Screen: NEGATIVE

## 2016-09-12 LAB — CBC WITH DIFFERENTIAL/PLATELET
Basophils Absolute: 0 10*3/uL (ref 0.0–0.1)
Basophils Relative: 1 %
EOS ABS: 0.1 10*3/uL (ref 0.0–0.7)
Eosinophils Relative: 2 %
HEMATOCRIT: 32.7 % — AB (ref 36.0–46.0)
HEMOGLOBIN: 10.6 g/dL — AB (ref 12.0–15.0)
LYMPHS ABS: 1.8 10*3/uL (ref 0.7–4.0)
Lymphocytes Relative: 23 %
MCH: 29.1 pg (ref 26.0–34.0)
MCHC: 32.4 g/dL (ref 30.0–36.0)
MCV: 89.8 fL (ref 78.0–100.0)
MONO ABS: 0.6 10*3/uL (ref 0.1–1.0)
MONOS PCT: 8 %
NEUTROS PCT: 66 %
Neutro Abs: 5.2 10*3/uL (ref 1.7–7.7)
Platelets: 296 10*3/uL (ref 150–400)
RBC: 3.64 MIL/uL — ABNORMAL LOW (ref 3.87–5.11)
RDW: 13.9 % (ref 11.5–15.5)
WBC: 7.8 10*3/uL (ref 4.0–10.5)

## 2016-09-12 LAB — SURGICAL PCR SCREEN
MRSA, PCR: NEGATIVE
Staphylococcus aureus: NEGATIVE

## 2016-09-12 LAB — PROTIME-INR
INR: 1.27
Prothrombin Time: 15.9 seconds — ABNORMAL HIGH (ref 11.4–15.2)

## 2016-09-12 LAB — APTT: aPTT: 34 seconds (ref 24–36)

## 2016-09-12 NOTE — Telephone Encounter (Signed)
Pt having knee surgery on 09-23-16.She wants to know when she needs to stop her blood thinner?

## 2016-09-12 NOTE — Telephone Encounter (Signed)
Raiford Simmonds, RN    08/09/16 3:10 PM  Note    FAXED  CLEARANCE REQUEST  FORM  FOR LEFT  TOTAL KNEE ARTHOPLASTY    PER DR HARDING   LOW RISK PATIENT ,LOW RISK SURGERY  OKAY TO TO HOLD ELIQUIS 5 MG  FOR 2 DAYS PRIOR TO SURGERY  AND RESTART WHEN STABLE POST OP SURGERY      LM for patient with above info.

## 2016-09-13 NOTE — Progress Notes (Addendum)
Anesthesia Chart Review:  Pt is a 71 year old female scheduled for L total knee arthroplasty on 09/23/2016 with Dorna Leitz, M.D.  - PCP is Unk Pinto, MD - Cardiologist is Glenetta Hew, M.D. Who has cleared patient for surgery. - Oncologist is Lurline Del, MD  PMH includes: Nonischemic cardiomyopathy, PAF, HTN, hyperlipidemia, hypothyroidism, anemia, breast cancer, post-op N/V, GERD. Never smoker. BMI 38. S/p lumbar fusion 10/16/13. S/p R breast lumpectomy 06/06/13.   Medications include: Arimidex, Eliquis, Lipitor, carvedilol, iron, Lasix, levothyroxine, Zantac, valsartan - I reached out to Dr. Ellyn Hack about stopping eliquis 3 days before surgery and was given the ok to do so. I notified pt her last dose of eliquis is 09/19/16.   Preoperative labs reviewed. PT 15.9, PTT normal. Will repeat PT DOS.   CXR 09/12/16: Mid left base subsegmental atelectasis. Mild infiltrate cannot be excluded. Follow-up exam suggested demonstrate clearing. - I notified Elmyra Ricks in Dr. [name]' office. I also spoke with pt by telephone.  She denies cough, fever, SOB.  I encouraged her to take frequent deep breaths.   EKG 05/25/16: NSR. LBBB.  Echo 10/22/15: - Left ventricle: Poor acoustic windows limit study Endocardium is difficult to see Overall LVEF is approximatey 45 to 50% with inferooseptal hypokinesis and possible posterior hypokinesis. The cavity size was normal. Wall thickness was normal. Doppler parameters are consistent with abnormal left ventricular relaxation (grade 1 diastolic dysfunction).  Cardiac cath 08/10/15:  1. Non-ischemic cardiomyopathy 2. No angiographic evidence of CAD 3. Normal filling pressures.   If labs acceptable DOS, I anticipate pt can proceed with surgery as scheduled.   Willeen Cass, FNP-BC [facility]/Anesthesiology Phone: [phone] 09/14/2016 12:22 PM

## 2016-09-22 MED ORDER — CLINDAMYCIN PHOSPHATE 900 MG/50ML IV SOLN
900.0000 mg | INTRAVENOUS | Status: AC
  Administered 2016-09-23: 900 mg via INTRAVENOUS
  Filled 2016-09-22: qty 50

## 2016-09-22 NOTE — H&P (Signed)
TOTAL KNEE ADMISSION H&P  Patient is being admitted for left total knee arthroplasty.  Subjective:  Chief Complaint:left knee pain.  HPI: [name] [name], 71 y.o. female, has a history of pain and functional disability in the left knee due to arthritis and has failed non-surgical conservative treatments for greater than 12 weeks to includeNSAID's and/or analgesics, corticosteriod injections, viscosupplementation injections, weight reduction as appropriate and activity modification.  Onset of symptoms was gradual, starting 5 years ago with gradually worsening course since that time. The patient noted prior procedures on the knee to include  arthroscopy and menisectomy on the left knee(s).  Patient currently rates pain in the left knee(s) at 8 out of 10 with activity. Patient has night pain, worsening of pain with activity and weight bearing, pain that interferes with activities of daily living, pain with passive range of motion, crepitus and joint swelling.  Patient has evidence of subchondral sclerosis, periarticular osteophytes and joint space narrowing by imaging studies. This patient has had failure of all reasonable conservative care. There is no active infection. Pt has a history of back pain and will likely need 2 midnights due to post op pain needs.  Patient Active Problem List   Diagnosis Date Noted  . Medication dose changed 12/09/2015  . Non-ischemic cardiomyopathy ([place])   . Chronic combined systolic and diastolic heart failure, NYHA class 2 ([place]) -- exacerbated by A. fib 08/08/2015  . Paroxysmal atrial fibrillation [facility]): CHA2DS2-VASc Score - starting Eliquis 08/06/2015  . Hypothyroidism 08/06/2015  . History of ulcer disease 08/06/2015  . Absolute anemia   . Encounter for Medicare annual wellness exam 03/31/2015  . DDD (degenerative disc disease), lumbar 06/16/2014  . Prediabetes 12/18/2013  . Vitamin D deficiency 12/18/2013  . Medication management 12/18/2013  . Paroxysmal  supraventricular tachycardia ([place]) 10/18/2013  . Radiculopathy 10/16/2013  . Obesity (BMI 30-39.9) 06/11/2013  . Malignant neoplasm of lower-inner quadrant of left breast in female, estrogen receptor positive ([place]) 05/20/2013  . History of pancreatitis 03/27/2008  . Hyperlipidemia 03/13/2008  . ANXIETY DEPRESSION 03/13/2008  . Essential hypertension 03/13/2008  . H/O left bundle branch block 03/13/2008  . Allergic rhinitis 03/13/2008  . GERD 03/13/2008   Past Medical History:  Diagnosis Date  . Allergy    Nasonex daily as needed  . Anemia   . Anxiety   . Arthritis   . Breast cancer ([place])     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  . Chronic back pain    cyst sitting on L4-5;slipped disc  . Depression    takes Cymbalta daily  . Diverticulosis   . Dysrhythmia   . GERD (gastroesophageal reflux disease)    takes Omeprazole daily  . Headache(784.0)    rare  . History of gastric ulcer at age 10  . History of shingles   . Hyperlipidemia    takes Atorvastatin 3 times a week  . Hypertension    takes Cardura nightly and Verapamil daily  . Hypothyroidism   . Insomnia    takes Restoril nightly  . Joint pain   . Nonischemic cardiomyopathy ([place]) 07/2015   Normal coronary arteries by cath. EF was 25-30% by echo. GR2 DD - likely related to LBBB in setting of A. fib  . Paroxysmal atrial fibrillation [facility]): CHA2DS2-VASc Score - starting Eliquis 08/06/2015   This patients CHA2DS2-VASc Score and unadjusted Ischemic Stroke Rate (% per year) is equal to 4.8 % stroke rate/year from a score of 4  Above score calculated as  1 point each if present [CHF, HTN, DM, Vascular=MI/PAD/Aortic Plaque, Age if 65-74, or Female]; 2 points each if present [Age > 75, or Stroke/TIA/TE]  . PONV (postoperative nausea and vomiting)   . S/P radiation therapy 07/18/2013-08/28/2013   1) Left Breast / 50 Gy in 25 fractions/ 2) Left Breast Boost / 10 Gy in 5 fractions  . Shingles   . Sinus  drainage    put on Levaquin yesterday if no better in 3 days will start prednisone  . Thyroid disease   . Weakness    tingling and numbness both hands and left leg  . Wears glasses     Past Surgical History:  Procedure Laterality Date  . ABDOMINAL HYSTERECTOMY    . BREAST SURGERY     lt lump-neg  . CARDIAC CATHETERIZATION N/A 08/10/2015   Procedure: Right/Left Heart Cath and Coronary Angiography;  Surgeon: Burnell Blanks, MD;  Location: [place] [facility];  Service: Cardiovascular;: Nonobstructive CAD  . CATARACT EXTRACTION BILATERAL W/ ANTERIOR VITRECTOMY Bilateral   . CHOLECYSTECTOMY    . COLONOSCOPY  2014  . DUPUYTREN / PALMAR FASCIOTOMY Bilateral 2007   x 3 to left and once to the right   . epidural injections     x 3  . FOOT SURGERY Right   . KNEE ARTHROSCOPY Bilateral   . LUMBAR FUSION  10/2013   Dr. Lynann Bologna  . TONSILLECTOMY    . TRANSTHORACIC ECHOCARDIOGRAM  08/07/2015   Severely reduced LVEF at 25-30% with diffuse HK. GR 2 DD. Ventricular dyssynergy secondary to LBBB. Small to moderate pericardial effusion but no hemodynamic compromise  . TRANSTHORACIC ECHOCARDIOGRAM  10/2015    (EF up from 25-30%): - Left ventricle: Poor acoustic windows -  difficult to see.    EF estimated at 45-50% with inferoseptal and possible posterior hypokinesis. GR 1 DD. No pericardial effusion.  . TUBAL LIGATION    . UPPER GI ENDOSCOPY      No prescriptions prior to admission.   Allergies  Allergen Reactions  . Requip [Ropinirole Hcl] Shortness Of Breath and Nausea And Vomiting  . Minocycline Hives  . Tetracyclines & Related Hives  . Zestril [Lisinopril] Cough  . Zetia [Ezetimibe] Other (See Comments)    MYALGIA JOINT PAIN  . Amoxicillin Rash     Has patient had a PCN reaction causing immediate rash, facial/tongue/throat swelling, SOB or lightheadedness with hypotension:No Has patient had a PCN reaction causing severe rash involving mucus membranes or skin necrosis: No Has  patient had a PCN reaction that required hospitalization: No Has patient had a PCN reaction occurring within the last 10 years: No If all of the above answers are "NO", then may proceed with Cephalosporin use.   . Codeine Other (See Comments)    fatigue  . Morphine Sulfate Other (See Comments)    Palpitations  . Sulfa Antibiotics Other (See Comments)    Headache (pt states that a blood pressure medicine caused a severe headache but doesn't remember a reaction to sulfa)    Social History  Substance Use Topics  . Smoking status: Never Smoker  . Smokeless tobacco: Never Used  . Alcohol use Yes     Comment: wine occasionally-rarely    Family History  Problem Relation Age of Onset  . Heart attack Mother   . Stroke Mother   . COPD Father   . Hypertension Father   . Colon polyps Neg Hx   . Esophageal cancer Neg Hx   . Rectal cancer Neg  Hx   . Stomach cancer Neg Hx      ROS ROS: I have reviewed the patient's review of systems thoroughly and there are no positive responses as relates to the HPI. Objective:  Physical Exam  Vital signs in last 24 hours:   Well-developed well-nourished patient in no acute distress. Alert and oriented x3 HEENT:within normal limits Cardiac: Regular rate and rhythm Pulmonary: Lungs clear to auscultation Abdomen: Soft and nontender.  Normal active bowel sounds  Musculoskeletal: l knee: painful rom,limited rom rom0-90 trace effusion Labs: Recent Results (from the past 2160 hour(s))  CBC with Differential     Status: None   Collection Time: 07/13/16 10:05 AM  Result Value Ref Range   WBC 7.1 3.9 - 10.3 10e3/uL   NEUT# 5.4 1.5 - 6.5 10e3/uL   HGB 12.0 11.6 - 15.9 g/dL   HCT 34.9 34.8 - 46.6 %   Platelets 279 145 - 400 10e3/uL   MCV 88.2 79.5 - 101.0 fL   MCH 30.3 25.1 - 34.0 pg   MCHC 34.3 31.5 - 36.0 g/dL   RBC 3.96 3.70 - 5.45 10e6/uL   RDW 13.9 11.2 - 14.5 %   lymph# 1.0 0.9 - 3.3 10e3/uL   MONO# 0.5 0.1 - 0.9 10e3/uL   Eosinophils  Absolute 0.2 0.0 - 0.5 10e3/uL   Basophils Absolute 0.1 0.0 - 0.1 10e3/uL   NEUT% 75.7 38.4 - 76.8 %   LYMPH% 14.2 14.0 - 49.7 %   MONO% 6.5 0.0 - 14.0 %   EOS% 2.6 0.0 - 7.0 %   BASO% 1.0 0.0 - 2.0 %  Comprehensive metabolic panel     Status: Abnormal   Collection Time: 07/13/16 10:05 AM  Result Value Ref Range   Sodium 139 136 - 145 mEq/L   Potassium 3.7 3.5 - 5.1 mEq/L   Chloride 104 98 - 109 mEq/L   CO2 25 22 - 29 mEq/L   Glucose 135 70 - 140 mg/dl    Comment: Glucose reference range is for nonfasting patients. Fasting glucose reference range is 70- 100.   BUN 15.2 7.0 - 26.0 mg/dL   Creatinine 0.8 0.6 - 1.1 mg/dL   Total Bilirubin 0.54 0.20 - 1.20 mg/dL   Alkaline Phosphatase 150 40 - 150 U/L   AST 17 5 - 34 U/L   ALT 20 0 - 55 U/L   Total Protein 7.2 6.4 - 8.3 g/dL   Albumin 3.6 3.5 - 5.0 g/dL   Calcium 9.7 8.4 - 10.4 mg/dL   Anion Gap 10 3 - 11 mEq/L   EGFR 71 (L) >90 ml/min/1.73 m2    Comment: eGFR is calculated using the CKD-EPI Creatinine Equation (2009)  CBC with Differential/Platelet     Status: Abnormal   Collection Time: 08/10/16  2:52 PM  Result Value Ref Range   WBC 10.1 3.8 - 10.8 K/uL   RBC 3.81 3.80 - 5.10 MIL/uL   Hemoglobin 11.1 (L) 11.7 - 15.5 g/dL   HCT 34.3 (L) 35.0 - 45.0 %   MCV 90.0 80.0 - 100.0 fL   MCH 29.1 27.0 - 33.0 pg   MCHC 32.4 32.0 - 36.0 g/dL   RDW 14.2 11.0 - 15.0 %   Platelets 319 140 - 400 K/uL   MPV 10.3 7.5 - 12.5 fL   Neutro Abs 6,969 1,500 - 7,800 cells/uL   Lymphs Abs 2,222 850 - 3,900 cells/uL   Monocytes Absolute 707 200 - 950 cells/uL   Eosinophils Absolute 202 15 -  500 cells/uL   Basophils Absolute 0 0 - 200 cells/uL   Neutrophils Relative % 69 %   Lymphocytes Relative 22 %   Monocytes Relative 7 %   Eosinophils Relative 2 %   Basophils Relative 0 %   Smear Review Criteria for review not met   BASIC METABOLIC PANEL WITH GFR     Status: None   Collection Time: 08/10/16  2:52 PM  Result Value Ref Range   Sodium 138  135 - 146 mmol/L   Potassium 4.3 3.5 - 5.3 mmol/L   Chloride 101 98 - 110 mmol/L   CO2 24 20 - 31 mmol/L   Glucose, Bld 94 65 - 99 mg/dL   BUN 21 7 - 25 mg/dL   Creat 0.93 0.60 - 0.93 mg/dL    Comment:   For patients > or = 71 years of age: The upper reference limit for Creatinine is approximately 13% higher for [name] identified as African-American.      Calcium 8.9 8.6 - 10.4 mg/dL   GFR, Est African American 72 >=60 mL/min   GFR, Est Non African American 62 >=60 mL/min    Comment:   The estimated GFR is a calculation valid for adults (>=35 years old) that uses the CKD-EPI algorithm to adjust for age and sex. It is   not to be used for children, pregnant women, hospitalized patients,    patients on dialysis, or with rapidly changing kidney function. According to the NKDEP, eGFR >89 is normal, 60-89 shows mild impairment, 30-59 shows moderate impairment, 15-29 shows severe impairment and <15 is ESRD.     Hepatic function panel     Status: None   Collection Time: 08/10/16  2:52 PM  Result Value Ref Range   Total Bilirubin 0.3 0.2 - 1.2 mg/dL   Bilirubin, Direct 0.1 <=0.2 mg/dL   Indirect Bilirubin 0.2 0.2 - 1.2 mg/dL   Alkaline Phosphatase 104 33 - 130 U/L   AST 14 10 - 35 U/L   ALT 15 6 - 29 U/L   Total Protein 6.5 6.1 - 8.1 g/dL   Albumin 3.8 3.6 - 5.1 g/dL  TSH     Status: None   Collection Time: 08/10/16  2:52 PM  Result Value Ref Range   TSH 1.90 mIU/L    Comment:   Reference Range   > or = 20 Years  0.40-4.50   Pregnancy Range First trimester  0.26-2.66 Second trimester 0.55-2.73 Third trimester  0.43-2.91     Lipid panel     Status: Abnormal   Collection Time: 08/10/16  2:52 PM  Result Value Ref Range   Cholesterol 168 <200 mg/dL   Triglycerides 194 (H) <150 mg/dL   HDL 47 (L) >50 mg/dL   Total CHOL/HDL Ratio 3.6 <5.0 Ratio   VLDL 39 (H) <30 mg/dL   LDL Cholesterol 82 <100 mg/dL  Hemoglobin A1c     Status: Abnormal   Collection Time: 08/10/16  2:52  PM  Result Value Ref Range   Hgb A1c MFr Bld 6.0 (H) <5.7 %    Comment:   For someone without known diabetes, a hemoglobin A1c value between 5.7% and 6.4% is consistent with prediabetes and should be confirmed with a follow-up test.   For someone with known diabetes, a value <7% indicates that their diabetes is well controlled. A1c targets should be individualized based on duration of diabetes, age, co-morbid conditions and other considerations.   This assay result is consistent with an increased risk  of diabetes.   Currently, no consensus exists regarding use of hemoglobin A1c for diagnosis of diabetes in children.      Mean Plasma Glucose 126 mg/dL  Magnesium     Status: None   Collection Time: 08/10/16  2:52 PM  Result Value Ref Range   Magnesium 2.0 1.5 - 2.5 mg/dL  Iron and TIBC     Status: None   Collection Time: 08/10/16  2:52 PM  Result Value Ref Range   Iron 52 45 - 160 ug/dL   UIBC 291 125 - 400 ug/dL   TIBC 343 250 - 450 ug/dL   %SAT 15 11 - 50 %  Vitamin B12     Status: None   Collection Time: 08/10/16  2:52 PM  Result Value Ref Range   Vitamin B-12 314 200 - 1,100 pg/mL  Surgical pcr screen     Status: None   Collection Time: 09/12/16  2:09 PM  Result Value Ref Range   MRSA, PCR NEGATIVE NEGATIVE   Staphylococcus aureus NEGATIVE NEGATIVE    Comment:        The Xpert SA Assay (FDA approved for NASAL specimens in patients over 86 years of age), is one component of a comprehensive surveillance program.  Test performance has been validated by [facility] for patients greater than or equal to 12 year old. It is not intended to diagnose infection nor to guide or monitor treatment.   Urinalysis, Routine w reflex microscopic     Status: Abnormal   Collection Time: 09/12/16  2:10 PM  Result Value Ref Range   Color, Urine YELLOW YELLOW   APPearance CLEAR CLEAR   Specific Gravity, Urine 1.019 1.005 - 1.030   pH 5.0 5.0 - 8.0   Glucose, UA NEGATIVE  NEGATIVE mg/dL   Hgb urine dipstick NEGATIVE NEGATIVE   Bilirubin Urine NEGATIVE NEGATIVE   Ketones, ur NEGATIVE NEGATIVE mg/dL   Protein, ur NEGATIVE NEGATIVE mg/dL   Nitrite NEGATIVE NEGATIVE   Leukocytes, UA SMALL (A) NEGATIVE   RBC / HPF 0-5 0 - 5 RBC/hpf   WBC, UA 6-30 0 - 5 WBC/hpf   Bacteria, UA RARE (A) NONE SEEN   Squamous Epithelial / LPF 0-5 (A) NONE SEEN   Mucous PRESENT    Hyaline Casts, UA PRESENT    Ca Oxalate Crys, UA PRESENT   APTT     Status: None   Collection Time: 09/12/16  2:11 PM  Result Value Ref Range   aPTT 34 24 - 36 seconds  CBC WITH DIFFERENTIAL     Status: Abnormal   Collection Time: 09/12/16  2:11 PM  Result Value Ref Range   WBC 7.8 4.0 - 10.5 K/uL   RBC 3.64 (L) 3.87 - 5.11 MIL/uL   Hemoglobin 10.6 (L) 12.0 - 15.0 g/dL   HCT 32.7 (L) 36.0 - 46.0 %   MCV 89.8 78.0 - 100.0 fL   MCH 29.1 26.0 - 34.0 pg   MCHC 32.4 30.0 - 36.0 g/dL   RDW 13.9 11.5 - 15.5 %   Platelets 296 150 - 400 K/uL   Neutrophils Relative % 66 %   Neutro Abs 5.2 1.7 - 7.7 K/uL   Lymphocytes Relative 23 %   Lymphs Abs 1.8 0.7 - 4.0 K/uL   Monocytes Relative 8 %   Monocytes Absolute 0.6 0.1 - 1.0 K/uL   Eosinophils Relative 2 %   Eosinophils Absolute 0.1 0.0 - 0.7 K/uL   Basophils Relative 1 %   Basophils  Absolute 0.0 0.0 - 0.1 K/uL  Comprehensive metabolic panel     Status: Abnormal   Collection Time: 09/12/16  2:11 PM  Result Value Ref Range   Sodium 139 135 - 145 mmol/L   Potassium 3.7 3.5 - 5.1 mmol/L   Chloride 103 101 - 111 mmol/L   CO2 25 22 - 32 mmol/L   Glucose, Bld 93 65 - 99 mg/dL   BUN 20 6 - 20 mg/dL   Creatinine, Ser 1.00 0.44 - 1.00 mg/dL   Calcium 9.4 8.9 - 10.3 mg/dL   Total Protein 6.8 6.5 - 8.1 g/dL   Albumin 3.9 3.5 - 5.0 g/dL   AST 22 15 - 41 U/L   ALT 19 14 - 54 U/L   Alkaline Phosphatase 112 38 - 126 U/L   Total Bilirubin 0.4 0.3 - 1.2 mg/dL   GFR calc non Af Amer 56 (L) >60 mL/min   GFR calc Af Amer >60 >60 mL/min    Comment: (NOTE) The  eGFR has been calculated using the CKD EPI equation. This calculation has not been validated in all clinical situations. eGFR's persistently <60 mL/min signify possible Chronic Kidney Disease.    Anion gap 11 5 - 15  Protime-INR     Status: Abnormal   Collection Time: 09/12/16  2:11 PM  Result Value Ref Range   Prothrombin Time 15.9 (H) 11.4 - 15.2 seconds   INR 1.27   Type and screen Order type and screen if day of surgery is less than 15 days from draw of preadmission visit or order morning of surgery if day of surgery is greater than 6 days from preadmission visit.     Status: None   Collection Time: 09/12/16  2:30 PM  Result Value Ref Range   ABO/RH(D) A POS    Antibody Screen NEG    Sample Expiration 09/26/2016    Extend sample reason NO TRANSFUSIONS OR PREGNANCY IN THE PAST 3 MONTHS     Estimated body mass index is 37.71 kg/m as calculated from the following:   Height as of 09/12/16: 5' 5"  (1.651 m).   Weight as of 09/12/16: 102.8 kg (226 lb 9.6 oz).   Imaging Review Plain radiographs demonstrate severe degenerative joint disease of the left knee(s). The overall alignment ismild valgus. The bone quality appears to be fair for age and reported activity level.  Assessment/Plan:  End stage arthritis, left knee   The patient history, physical examination, clinical judgment of the provider and imaging studies are consistent with end stage degenerative joint disease of the left knee(s) and total knee arthroplasty is deemed medically necessary. The treatment options including medical management, injection therapy arthroscopy and arthroplasty were discussed at length. The risks and benefits of total knee arthroplasty were presented and reviewed. The risks due to aseptic loosening, infection, stiffness, patella tracking problems, thromboembolic complications and other imponderables were discussed. The patient acknowledged the explanation, agreed to proceed with the plan and consent was  signed. Patient is being admitted for inpatient treatment for surgery, pain control, PT, OT, prophylactic antibiotics, VTE prophylaxis, progressive ambulation and ADL's and discharge planning. The patient is planning to be discharged home with home health services

## 2016-09-22 NOTE — Anesthesia Preprocedure Evaluation (Addendum)
Anesthesia Evaluation  Patient identified by MRN, date of birth, ID band Patient awake    Reviewed: Allergy & Precautions, H&P , NPO status , Patient's Chart, lab work & pertinent test results  History of Anesthesia Complications (+) PONVNegative for: history of anesthetic complications  Airway Mallampati: II  TM Distance: >3 FB Neck ROM: Full    Dental  (+) Teeth Intact, Dental Advisory Given   Pulmonary neg pulmonary ROS,    Pulmonary exam normal breath sounds clear to auscultation       Cardiovascular hypertension, Pt. on medications and Pt. on home beta blockers (-) angina Rhythm:Regular Rate:Normal  Patient says she was told she had "extra beats".  Underwent stress test that was negative.   ECG: SR, rate 78  LBBB ECHO:- Left ventricle: Poor acoustic windows limit study Endocardium is difficult to see Overall LVEF is approximatey 45 to 50% with inferooseptal hypokeihnesis and possible posterior hypokinesis. The cavity size was normal. Wall thickness was normal. Doppler parameters are consistent with abnormal left ventricular relaxation (grade 1 diastolic dysfunction). Cardiac cath 08/10/15:  1. Non-ischemic cardiomyopathy 2. No angiographic evidence of CAD 3. Normal filling pressures.  Cardiologist is Glenetta Hew, M.D. Who has cleared patient for surgery.   Neuro/Psych Anxiety Depression negative neurological ROS     GI/Hepatic Neg liver ROS, GERD  Medicated and Controlled,  Endo/Other  Hypothyroidism   Renal/GU negative Renal ROS     Musculoskeletal  (+) Arthritis , Osteoarthritis,    Abdominal Normal abdominal exam  (+) + obese,   Peds  Hematology  (+) anemia ,   Anesthesia Other Findings Breast cancer s/p surgery and XRT Obese  Hyperlipidemia  Reproductive/Obstetrics                            Anesthesia Physical  Anesthesia Plan  ASA: III  Anesthesia Plan: Spinal and  Regional   Post-op Pain Management:  Regional for Post-op pain   Induction: Intravenous  PONV Risk Score and Plan: 3 and Ondansetron, Dexamethasone, Propofol and Midazolam  Airway Management Planned:   Additional Equipment:   Intra-op Plan:   Post-operative Plan:   Informed Consent:   Dental advisory given  Plan Discussed with: CRNA  Anesthesia Plan Comments: (S/p lumber spine surgery. Potential for general anesthesia discussed)        Anesthesia Quick Evaluation

## 2016-09-23 ENCOUNTER — Encounter (HOSPITAL_COMMUNITY): Payer: Self-pay | Admitting: General Practice

## 2016-09-23 ENCOUNTER — Inpatient Hospital Stay (HOSPITAL_COMMUNITY): Payer: Medicare Other

## 2016-09-23 ENCOUNTER — Inpatient Hospital Stay (HOSPITAL_COMMUNITY)
Admission: RE | Admit: 2016-09-23 | Discharge: 2016-09-25 | DRG: 470 | Disposition: A | Discharge status: Skilled Nursing Facility | Payer: Medicare Other | Source: Ambulatory Visit | Attending: Orthopedic Surgery | Admitting: Orthopedic Surgery

## 2016-09-23 ENCOUNTER — Inpatient Hospital Stay (HOSPITAL_COMMUNITY): Payer: Medicare Other | Admitting: Emergency Medicine

## 2016-09-23 ENCOUNTER — Other Ambulatory Visit: Payer: Self-pay

## 2016-09-23 ENCOUNTER — Encounter (HOSPITAL_COMMUNITY)
Admission: RE | Disposition: A | Discharge status: Skilled Nursing Facility | Payer: Self-pay | Source: Ambulatory Visit | Attending: Orthopedic Surgery

## 2016-09-23 DIAGNOSIS — I428 Other cardiomyopathies: Secondary | ICD-10-CM | POA: Diagnosis present

## 2016-09-23 DIAGNOSIS — G8929 Other chronic pain: Secondary | ICD-10-CM | POA: Diagnosis present

## 2016-09-23 DIAGNOSIS — M549 Dorsalgia, unspecified: Secondary | ICD-10-CM | POA: Diagnosis present

## 2016-09-23 DIAGNOSIS — E785 Hyperlipidemia, unspecified: Secondary | ICD-10-CM | POA: Diagnosis present

## 2016-09-23 DIAGNOSIS — Z853 Personal history of malignant neoplasm of breast: Secondary | ICD-10-CM | POA: Diagnosis not present

## 2016-09-23 DIAGNOSIS — E669 Obesity, unspecified: Secondary | ICD-10-CM | POA: Diagnosis present

## 2016-09-23 DIAGNOSIS — M1712 Unilateral primary osteoarthritis, left knee: Secondary | ICD-10-CM | POA: Diagnosis present

## 2016-09-23 DIAGNOSIS — R7303 Prediabetes: Secondary | ICD-10-CM | POA: Diagnosis present

## 2016-09-23 DIAGNOSIS — G4700 Insomnia, unspecified: Secondary | ICD-10-CM | POA: Diagnosis present

## 2016-09-23 DIAGNOSIS — Z6837 Body mass index (BMI) 37.0-37.9, adult: Secondary | ICD-10-CM

## 2016-09-23 DIAGNOSIS — Z79899 Other long term (current) drug therapy: Secondary | ICD-10-CM

## 2016-09-23 DIAGNOSIS — I110 Hypertensive heart disease with heart failure: Secondary | ICD-10-CM | POA: Diagnosis present

## 2016-09-23 DIAGNOSIS — K219 Gastro-esophageal reflux disease without esophagitis: Secondary | ICD-10-CM | POA: Diagnosis present

## 2016-09-23 DIAGNOSIS — M25562 Pain in left knee: Secondary | ICD-10-CM | POA: Diagnosis present

## 2016-09-23 DIAGNOSIS — I480 Paroxysmal atrial fibrillation: Secondary | ICD-10-CM | POA: Diagnosis present

## 2016-09-23 DIAGNOSIS — I5042 Chronic combined systolic (congestive) and diastolic (congestive) heart failure: Secondary | ICD-10-CM | POA: Diagnosis present

## 2016-09-23 DIAGNOSIS — E039 Hypothyroidism, unspecified: Secondary | ICD-10-CM | POA: Diagnosis present

## 2016-09-23 DIAGNOSIS — I447 Left bundle-branch block, unspecified: Secondary | ICD-10-CM | POA: Diagnosis present

## 2016-09-23 DIAGNOSIS — Z981 Arthrodesis status: Secondary | ICD-10-CM

## 2016-09-23 DIAGNOSIS — F329 Major depressive disorder, single episode, unspecified: Secondary | ICD-10-CM | POA: Diagnosis present

## 2016-09-23 DIAGNOSIS — F419 Anxiety disorder, unspecified: Secondary | ICD-10-CM | POA: Diagnosis present

## 2016-09-23 DIAGNOSIS — Z923 Personal history of irradiation: Secondary | ICD-10-CM

## 2016-09-23 DIAGNOSIS — Z96659 Presence of unspecified artificial knee joint: Secondary | ICD-10-CM

## 2016-09-23 HISTORY — PX: TOTAL KNEE ARTHROPLASTY: SHX125

## 2016-09-23 LAB — PROTIME-INR
INR: 0.97
Prothrombin Time: 12.8 seconds (ref 11.4–15.2)

## 2016-09-23 SURGERY — ARTHROPLASTY, KNEE, TOTAL
Anesthesia: Regional | Site: Knee | Laterality: Left

## 2016-09-23 MED ORDER — IRBESARTAN 75 MG PO TABS
75.0000 mg | ORAL_TABLET | Freq: Every day | ORAL | Status: DC
  Administered 2016-09-23 – 2016-09-25 (×3): 75 mg via ORAL
  Filled 2016-09-23 (×3): qty 1

## 2016-09-23 MED ORDER — OXYCODONE-ACETAMINOPHEN 5-325 MG PO TABS: 1.0000 | ORAL_TABLET | Freq: Four times a day (QID) | ORAL | 0 refills | Status: DC | PRN

## 2016-09-23 MED ORDER — SODIUM CHLORIDE 0.9 % IJ SOLN
INTRAMUSCULAR | Status: DC | PRN
  Administered 2016-09-23: 20 mL

## 2016-09-23 MED ORDER — MIDAZOLAM HCL 2 MG/2ML IJ SOLN
INTRAMUSCULAR | Status: AC
  Filled 2016-09-23: qty 2

## 2016-09-23 MED ORDER — TRANEXAMIC ACID 1000 MG/10ML IV SOLN
1000.0000 mg | INTRAVENOUS | Status: AC
  Administered 2016-09-23: 1000 mg via INTRAVENOUS
  Filled 2016-09-23 (×2): qty 10

## 2016-09-23 MED ORDER — TRANEXAMIC ACID 1000 MG/10ML IV SOLN
2000.0000 mg | INTRAVENOUS | Status: AC
  Administered 2016-09-23: 2000 mg via TOPICAL
  Filled 2016-09-23: qty 20

## 2016-09-23 MED ORDER — PROPOFOL 10 MG/ML IV BOLUS
INTRAVENOUS | Status: DC | PRN
  Administered 2016-09-23: 30 mg via INTRAVENOUS

## 2016-09-23 MED ORDER — KETAMINE HCL 10 MG/ML IJ SOLN
INTRAMUSCULAR | Status: DC | PRN
  Administered 2016-09-23: 20 mg via INTRAVENOUS
  Administered 2016-09-23: 10 mg via INTRAVENOUS

## 2016-09-23 MED ORDER — FENTANYL CITRATE (PF) 100 MCG/2ML IJ SOLN
INTRAMUSCULAR | Status: AC
  Administered 2016-09-23: 25 ug via INTRAVENOUS
  Filled 2016-09-23: qty 2

## 2016-09-23 MED ORDER — DIPHENHYDRAMINE HCL 12.5 MG/5ML PO ELIX: 12.5000 mg | ORAL_SOLUTION | ORAL | Status: DC | PRN

## 2016-09-23 MED ORDER — ONDANSETRON HCL 4 MG/2ML IJ SOLN
INTRAMUSCULAR | Status: DC | PRN
  Administered 2016-09-23: 4 mg via INTRAVENOUS

## 2016-09-23 MED ORDER — 0.9 % SODIUM CHLORIDE (POUR BTL) OPTIME
TOPICAL | Status: DC | PRN
  Administered 2016-09-23: 1000 mL

## 2016-09-23 MED ORDER — POLYETHYLENE GLYCOL 3350 17 G PO PACK
17.0000 g | PACK | Freq: Every day | ORAL | Status: DC | PRN
  Administered 2016-09-25: 17 g via ORAL
  Filled 2016-09-23: qty 1

## 2016-09-23 MED ORDER — PROPOFOL 10 MG/ML IV BOLUS
INTRAVENOUS | Status: AC
  Filled 2016-09-23: qty 20

## 2016-09-23 MED ORDER — ATORVASTATIN CALCIUM 40 MG PO TABS
40.0000 mg | ORAL_TABLET | Freq: Every day | ORAL | Status: DC
  Administered 2016-09-23 – 2016-09-24 (×2): 40 mg via ORAL
  Filled 2016-09-23 (×2): qty 1

## 2016-09-23 MED ORDER — DOCUSATE SODIUM 100 MG PO CAPS: 100.0000 mg | ORAL_CAPSULE | Freq: Two times a day (BID) | ORAL | 0 refills | Status: DC

## 2016-09-23 MED ORDER — BISACODYL 5 MG PO TBEC
5.0000 mg | DELAYED_RELEASE_TABLET | Freq: Every day | ORAL | Status: DC | PRN
  Administered 2016-09-25: 5 mg via ORAL
  Filled 2016-09-23: qty 1

## 2016-09-23 MED ORDER — FENTANYL CITRATE (PF) 100 MCG/2ML IJ SOLN
25.0000 ug | INTRAMUSCULAR | Status: DC | PRN
  Administered 2016-09-23: 25 ug via INTRAVENOUS
  Administered 2016-09-23: 50 ug via INTRAVENOUS
  Administered 2016-09-23: 25 ug via INTRAVENOUS

## 2016-09-23 MED ORDER — CARVEDILOL 12.5 MG PO TABS
12.5000 mg | ORAL_TABLET | Freq: Two times a day (BID) | ORAL | Status: DC
  Administered 2016-09-23 – 2016-09-25 (×4): 12.5 mg via ORAL
  Filled 2016-09-23 (×4): qty 1

## 2016-09-23 MED ORDER — PHENYLEPHRINE HCL 10 MG/ML IJ SOLN
INTRAMUSCULAR | Status: DC | PRN
  Administered 2016-09-23: 25 ug/min via INTRAVENOUS

## 2016-09-23 MED ORDER — OXYCODONE HCL 5 MG PO TABS
ORAL_TABLET | ORAL | Status: AC
  Filled 2016-09-23: qty 1

## 2016-09-23 MED ORDER — DOCUSATE SODIUM 100 MG PO CAPS
100.0000 mg | ORAL_CAPSULE | Freq: Two times a day (BID) | ORAL | Status: DC
  Administered 2016-09-23 – 2016-09-25 (×3): 100 mg via ORAL
  Filled 2016-09-23 (×4): qty 1

## 2016-09-23 MED ORDER — OXYMETAZOLINE HCL 0.05 % NA SOLN
NASAL | Status: DC | PRN
  Administered 2016-09-23: 3 via NASAL

## 2016-09-23 MED ORDER — ROPIVACAINE HCL 5 MG/ML IJ SOLN
INTRAMUSCULAR | Status: DC | PRN
  Administered 2016-09-23: 30 mL via PERINEURAL

## 2016-09-23 MED ORDER — FERROUS SULFATE 325 (65 FE) MG PO TABS
325.0000 mg | ORAL_TABLET | Freq: Two times a day (BID) | ORAL | Status: DC
  Administered 2016-09-23 – 2016-09-25 (×4): 325 mg via ORAL
  Filled 2016-09-23 (×4): qty 1

## 2016-09-23 MED ORDER — BUPIVACAINE IN DEXTROSE 0.75-8.25 % IT SOLN
INTRATHECAL | Status: DC | PRN
  Administered 2016-09-23: 1.6 mL via INTRATHECAL

## 2016-09-23 MED ORDER — OXYMETAZOLINE HCL 0.05 % NA SOLN
NASAL | Status: AC
  Filled 2016-09-23: qty 15

## 2016-09-23 MED ORDER — METHOCARBAMOL 500 MG PO TABS
500.0000 mg | ORAL_TABLET | Freq: Four times a day (QID) | ORAL | Status: DC | PRN
  Administered 2016-09-24 – 2016-09-25 (×3): 500 mg via ORAL
  Filled 2016-09-23 (×4): qty 1

## 2016-09-23 MED ORDER — CLINDAMYCIN PHOSPHATE 600 MG/50ML IV SOLN
600.0000 mg | Freq: Four times a day (QID) | INTRAVENOUS | Status: AC
  Administered 2016-09-23 (×2): 600 mg via INTRAVENOUS
  Filled 2016-09-23 (×2): qty 50

## 2016-09-23 MED ORDER — MIDAZOLAM HCL 2 MG/2ML IJ SOLN
INTRAMUSCULAR | Status: DC | PRN
  Administered 2016-09-23 (×2): 1 mg via INTRAVENOUS

## 2016-09-23 MED ORDER — BUPIVACAINE HCL (PF) 0.5 % IJ SOLN
INTRAMUSCULAR | Status: DC | PRN
  Administered 2016-09-23: 30 mL

## 2016-09-23 MED ORDER — BUPIVACAINE LIPOSOME 1.3 % IJ SUSP
20.0000 mL | INTRAMUSCULAR | Status: DC
  Filled 2016-09-23: qty 20

## 2016-09-23 MED ORDER — KETAMINE HCL-SODIUM CHLORIDE 100-0.9 MG/10ML-% IV SOSY
PREFILLED_SYRINGE | INTRAVENOUS | Status: AC
  Filled 2016-09-23: qty 10

## 2016-09-23 MED ORDER — PROPOFOL 500 MG/50ML IV EMUL
INTRAVENOUS | Status: DC | PRN
  Administered 2016-09-23: 25 ug/kg/min via INTRAVENOUS

## 2016-09-23 MED ORDER — ONDANSETRON HCL 4 MG/2ML IJ SOLN
INTRAMUSCULAR | Status: AC
  Filled 2016-09-23: qty 2

## 2016-09-23 MED ORDER — PROPOFOL 1000 MG/100ML IV EMUL
INTRAVENOUS | Status: AC
  Filled 2016-09-23: qty 100

## 2016-09-23 MED ORDER — TEMAZEPAM 15 MG PO CAPS
30.0000 mg | ORAL_CAPSULE | Freq: Every day | ORAL | Status: DC
  Administered 2016-09-23: 30 mg via ORAL
  Filled 2016-09-23 (×2): qty 2

## 2016-09-23 MED ORDER — ONDANSETRON HCL 4 MG/2ML IJ SOLN: 4.0000 mg | Freq: Once | INTRAMUSCULAR | Status: DC | PRN

## 2016-09-23 MED ORDER — ONDANSETRON HCL 4 MG PO TABS: 4.0000 mg | ORAL_TABLET | Freq: Four times a day (QID) | ORAL | Status: DC | PRN

## 2016-09-23 MED ORDER — ANASTROZOLE 1 MG PO TABS: 1.0000 mg | ORAL_TABLET | Freq: Every day | ORAL | 3 refills | Status: DC

## 2016-09-23 MED ORDER — ACETAMINOPHEN 650 MG RE SUPP: 650.0000 mg | Freq: Four times a day (QID) | RECTAL | Status: DC | PRN

## 2016-09-23 MED ORDER — METHOCARBAMOL 1000 MG/10ML IJ SOLN
500.0000 mg | Freq: Four times a day (QID) | INTRAVENOUS | Status: DC | PRN
  Filled 2016-09-23: qty 5

## 2016-09-23 MED ORDER — BUPIVACAINE LIPOSOME 1.3 % IJ SUSP
INTRAMUSCULAR | Status: DC | PRN
  Administered 2016-09-23: 20 mL

## 2016-09-23 MED ORDER — FENTANYL CITRATE (PF) 250 MCG/5ML IJ SOLN
INTRAMUSCULAR | Status: DC | PRN
  Administered 2016-09-23 (×3): 50 ug via INTRAVENOUS

## 2016-09-23 MED ORDER — DULOXETINE HCL 60 MG PO CPEP
90.0000 mg | ORAL_CAPSULE | Freq: Every morning | ORAL | Status: DC
  Administered 2016-09-24 – 2016-09-25 (×2): 90 mg via ORAL
  Filled 2016-09-23 (×2): qty 1

## 2016-09-23 MED ORDER — TRANEXAMIC ACID 1000 MG/10ML IV SOLN
1000.0000 mg | Freq: Once | INTRAVENOUS | Status: DC
  Filled 2016-09-23: qty 10

## 2016-09-23 MED ORDER — BUPROPION HCL ER (XL) 150 MG PO TB24
150.0000 mg | ORAL_TABLET | Freq: Every morning | ORAL | Status: DC
  Administered 2016-09-23 – 2016-09-25 (×3): 150 mg via ORAL
  Filled 2016-09-23 (×2): qty 1

## 2016-09-23 MED ORDER — BUPIVACAINE HCL (PF) 0.5 % IJ SOLN
INTRAMUSCULAR | Status: AC
  Filled 2016-09-23: qty 30

## 2016-09-23 MED ORDER — HYDROMORPHONE HCL 1 MG/ML IJ SOLN
0.5000 mg | INTRAMUSCULAR | Status: DC | PRN
  Administered 2016-09-23: 0.5 mg via INTRAVENOUS
  Administered 2016-09-23 – 2016-09-24 (×5): 1 mg via INTRAVENOUS
  Filled 2016-09-23 (×6): qty 1

## 2016-09-23 MED ORDER — LIDOCAINE HCL (CARDIAC) 20 MG/ML IV SOLN
INTRAVENOUS | Status: DC | PRN
  Administered 2016-09-23: 60 mg via INTRATRACHEAL

## 2016-09-23 MED ORDER — DEXAMETHASONE SODIUM PHOSPHATE 10 MG/ML IJ SOLN
10.0000 mg | Freq: Two times a day (BID) | INTRAMUSCULAR | Status: AC
  Administered 2016-09-23 – 2016-09-24 (×3): 10 mg via INTRAVENOUS
  Filled 2016-09-23 (×3): qty 1

## 2016-09-23 MED ORDER — ANASTROZOLE 1 MG PO TABS
1.0000 mg | ORAL_TABLET | Freq: Every day | ORAL | Status: DC
  Administered 2016-09-24 – 2016-09-25 (×2): 1 mg via ORAL
  Filled 2016-09-23 (×2): qty 1

## 2016-09-23 MED ORDER — LIDOCAINE 2% (20 MG/ML) 5 ML SYRINGE
INTRAMUSCULAR | Status: AC
  Filled 2016-09-23: qty 5

## 2016-09-23 MED ORDER — FUROSEMIDE 20 MG PO TABS
20.0000 mg | ORAL_TABLET | Freq: Two times a day (BID) | ORAL | Status: DC
  Administered 2016-09-23 – 2016-09-25 (×4): 20 mg via ORAL
  Filled 2016-09-23 (×4): qty 1

## 2016-09-23 MED ORDER — SODIUM CHLORIDE 0.9 % IV SOLN
INTRAVENOUS | Status: DC
  Administered 2016-09-23 (×2): via INTRAVENOUS

## 2016-09-23 MED ORDER — CHLORHEXIDINE GLUCONATE 4 % EX LIQD: 60.0000 mL | Freq: Once | CUTANEOUS | Status: DC

## 2016-09-23 MED ORDER — APIXABAN 5 MG PO TABS
5.0000 mg | ORAL_TABLET | Freq: Two times a day (BID) | ORAL | Status: DC
  Administered 2016-09-24 – 2016-09-25 (×3): 5 mg via ORAL
  Filled 2016-09-23 (×3): qty 1

## 2016-09-23 MED ORDER — FENTANYL CITRATE (PF) 250 MCG/5ML IJ SOLN
INTRAMUSCULAR | Status: AC
  Filled 2016-09-23: qty 5

## 2016-09-23 MED ORDER — ACETAMINOPHEN 325 MG PO TABS
650.0000 mg | ORAL_TABLET | Freq: Four times a day (QID) | ORAL | Status: DC | PRN
  Administered 2016-09-23 – 2016-09-24 (×2): 650 mg via ORAL
  Filled 2016-09-23 (×2): qty 2

## 2016-09-23 MED ORDER — ALUM & MAG HYDROXIDE-SIMETH 200-200-20 MG/5ML PO SUSP
30.0000 mL | ORAL | Status: DC | PRN
  Administered 2016-09-24: 30 mL via ORAL
  Filled 2016-09-23: qty 30

## 2016-09-23 MED ORDER — GABAPENTIN 300 MG PO CAPS
300.0000 mg | ORAL_CAPSULE | Freq: Two times a day (BID) | ORAL | Status: DC
  Administered 2016-09-23 – 2016-09-25 (×5): 300 mg via ORAL
  Filled 2016-09-23 (×5): qty 1

## 2016-09-23 MED ORDER — GLYCOPYRROLATE 0.2 MG/ML IJ SOLN
INTRAMUSCULAR | Status: DC | PRN
  Administered 2016-09-23 (×2): 0.1 mg via INTRAVENOUS

## 2016-09-23 MED ORDER — LEVOTHYROXINE SODIUM 50 MCG PO TABS
50.0000 ug | ORAL_TABLET | Freq: Every day | ORAL | Status: DC
  Administered 2016-09-24 – 2016-09-25 (×2): 50 ug via ORAL
  Filled 2016-09-23 (×2): qty 1

## 2016-09-23 MED ORDER — ONDANSETRON HCL 4 MG/2ML IJ SOLN: 4.0000 mg | Freq: Four times a day (QID) | INTRAMUSCULAR | Status: DC | PRN

## 2016-09-23 MED ORDER — MAGNESIUM CITRATE PO SOLN: 1.0000 | Freq: Once | ORAL | Status: DC | PRN

## 2016-09-23 MED ORDER — LACTATED RINGERS IV SOLN
INTRAVENOUS | Status: DC | PRN
  Administered 2016-09-23 (×2): via INTRAVENOUS

## 2016-09-23 MED ORDER — OXYCODONE HCL 5 MG PO TABS
5.0000 mg | ORAL_TABLET | ORAL | Status: DC | PRN
  Administered 2016-09-23 (×2): 10 mg via ORAL
  Administered 2016-09-23: 5 mg via ORAL
  Administered 2016-09-24 – 2016-09-25 (×7): 10 mg via ORAL
  Filled 2016-09-23 (×10): qty 2

## 2016-09-23 SURGICAL SUPPLY — 67 items
APL SKNCLS STERI-STRIP NONHPOA (GAUZE/BANDAGES/DRESSINGS) ×1
BAG DECANTER FOR FLEXI CONT (MISCELLANEOUS) ×2 IMPLANT
BANDAGE ELASTIC 6 VELCRO ST LF (GAUZE/BANDAGES/DRESSINGS) ×3 IMPLANT
BANDAGE ESMARK 6X9 LF (GAUZE/BANDAGES/DRESSINGS) ×1 IMPLANT
BENZOIN TINCTURE PRP APPL 2/3 (GAUZE/BANDAGES/DRESSINGS) ×3 IMPLANT
BLADE SAGITTAL 25.0X1.19X90 (BLADE) ×2 IMPLANT
BLADE SAGITTAL 25.0X1.19X90MM (BLADE) ×1
BLADE SAW SAG 90X13X1.27 (BLADE) ×3 IMPLANT
BNDG CMPR 9X6 STRL LF SNTH (GAUZE/BANDAGES/DRESSINGS) ×1
BNDG ESMARK 6X9 LF (GAUZE/BANDAGES/DRESSINGS) ×3
BOWL SMART MIX CTS (DISPOSABLE) ×3 IMPLANT
CAPT KNEE TOTAL 3 ATTUNE ×2 IMPLANT
CEMENT HV SMART SET (Cement) ×6 IMPLANT
CLOSURE WOUND 1/2 X4 (GAUZE/BANDAGES/DRESSINGS) ×2
COVER SURGICAL LIGHT HANDLE (MISCELLANEOUS) ×3 IMPLANT
CUFF TOURNIQUET SINGLE 34IN LL (TOURNIQUET CUFF) ×3 IMPLANT
CUFF TOURNIQUET SINGLE 44IN (TOURNIQUET CUFF) IMPLANT
DRAPE EXTREMITY T 121X128X90 (DRAPE) ×3 IMPLANT
DRAPE U-SHAPE 47X51 STRL (DRAPES) ×3 IMPLANT
DRSG AQUACEL AG ADV 3.5X10 (GAUZE/BANDAGES/DRESSINGS) ×3 IMPLANT
DRSG PAD ABDOMINAL 8X10 ST (GAUZE/BANDAGES/DRESSINGS) ×3 IMPLANT
DURAPREP 26ML APPLICATOR (WOUND CARE) ×5 IMPLANT
ELECT REM PT RETURN 9FT ADLT (ELECTROSURGICAL) ×3
ELECTRODE REM PT RTRN 9FT ADLT (ELECTROSURGICAL) ×1 IMPLANT
EVACUATOR 1/8 PVC DRAIN (DRAIN) IMPLANT
FACESHIELD WRAPAROUND (MASK) IMPLANT
FACESHIELD WRAPAROUND OR TEAM (MASK) ×1 IMPLANT
GAUZE SPONGE 4X4 12PLY STRL (GAUZE/BANDAGES/DRESSINGS) ×3 IMPLANT
GLOVE BIOGEL PI IND STRL 8 (GLOVE) ×2 IMPLANT
GLOVE BIOGEL PI INDICATOR 8 (GLOVE) ×4
GLOVE ECLIPSE 7.5 STRL STRAW (GLOVE) ×6 IMPLANT
GOWN STRL REUS W/ TWL LRG LVL3 (GOWN DISPOSABLE) ×1 IMPLANT
GOWN STRL REUS W/ TWL XL LVL3 (GOWN DISPOSABLE) ×2 IMPLANT
GOWN STRL REUS W/TWL LRG LVL3 (GOWN DISPOSABLE) ×3
GOWN STRL REUS W/TWL XL LVL3 (GOWN DISPOSABLE) ×6
HANDPIECE INTERPULSE COAX TIP (DISPOSABLE) ×3
HOOD PEEL AWAY FACE SHEILD DIS (HOOD) ×9 IMPLANT
IMMOBILIZER KNEE 22 UNIV (SOFTGOODS) ×3 IMPLANT
KIT BASIN OR (CUSTOM PROCEDURE TRAY) ×3 IMPLANT
KIT ROOM TURNOVER OR (KITS) ×3 IMPLANT
MANIFOLD NEPTUNE II (INSTRUMENTS) ×3 IMPLANT
NDL 18GX1X1/2 (RX/OR ONLY) (NEEDLE) IMPLANT
NEEDLE 18GX1X1/2 (RX/OR ONLY) (NEEDLE) ×3 IMPLANT
NEEDLE 22X1 1/2 (OR ONLY) (NEEDLE) ×5 IMPLANT
NS IRRIG 1000ML POUR BTL (IV SOLUTION) ×3 IMPLANT
PACK TOTAL JOINT (CUSTOM PROCEDURE TRAY) ×3 IMPLANT
PAD ARMBOARD 7.5X6 YLW CONV (MISCELLANEOUS) ×3 IMPLANT
PAD CAST 4YDX4 CTTN HI CHSV (CAST SUPPLIES) IMPLANT
PADDING CAST COTTON 4X4 STRL (CAST SUPPLIES) ×3
PIN STEINMAN FIXATION KNEE (PIN) ×2 IMPLANT
SET HNDPC FAN SPRY TIP SCT (DISPOSABLE) ×1 IMPLANT
STAPLER VISISTAT 35W (STAPLE) IMPLANT
STRIP CLOSURE SKIN 1/2X4 (GAUZE/BANDAGES/DRESSINGS) ×2 IMPLANT
SUCTION FRAZIER HANDLE 10FR (MISCELLANEOUS) ×2
SUCTION TUBE FRAZIER 10FR DISP (MISCELLANEOUS) ×1 IMPLANT
SUT MNCRL AB 3-0 PS2 18 (SUTURE) ×2 IMPLANT
SUT VIC AB 0 CTB1 27 (SUTURE) ×6 IMPLANT
SUT VIC AB 1 CT1 27 (SUTURE) ×6
SUT VIC AB 1 CT1 27XBRD ANBCTR (SUTURE) ×2 IMPLANT
SUT VIC AB 2-0 CTB1 (SUTURE) ×6 IMPLANT
SYR 20CC LL (SYRINGE) ×2 IMPLANT
SYR 50ML LL SCALE MARK (SYRINGE) ×3 IMPLANT
TOWEL OR 17X24 6PK STRL BLUE (TOWEL DISPOSABLE) ×3 IMPLANT
TOWEL OR 17X26 10 PK STRL BLUE (TOWEL DISPOSABLE) ×3 IMPLANT
TRAY CATH 16FR W/PLASTIC CATH (SET/KITS/TRAYS/PACK) IMPLANT
TRAY FOLEY W/METER SILVER 16FR (SET/KITS/TRAYS/PACK) ×2 IMPLANT
WRAP KNEE MAXI GEL POST OP (GAUZE/BANDAGES/DRESSINGS) ×3 IMPLANT

## 2016-09-23 NOTE — Discharge Instructions (Signed)
INSTRUCTIONS AFTER JOINT REPLACEMENT  ° °o Remove items at home which could result in a fall. This includes throw rugs or furniture in walking pathways °o ICE to the affected joint every three hours while awake for 30 minutes at a time, for at least the first 3-5 days, and then as needed for pain and swelling.  Continue to use ice for pain and swelling. You may notice swelling that will progress down to the foot and ankle.  This is normal after surgery.  Elevate your leg when you are not up walking on it.   °o Continue to use the breathing machine you got in the hospital (incentive spirometer) which will help keep your temperature down.  It is common for your temperature to cycle up and down following surgery, especially at night when you are not up moving around and exerting yourself.  The breathing machine keeps your lungs expanded and your temperature down. ° ° °DIET:  As you were doing prior to hospitalization, we recommend a well-balanced diet. ° °DRESSING / WOUND CARE / SHOWERING ° °Keep the surgical dressing until follow up.  The dressing is water proof, so you can shower without any extra covering.  IF THE DRESSING FALLS OFF or the wound gets wet inside, change the dressing with sterile gauze.  Please use good hand washing techniques before changing the dressing.  Do not use any lotions or creams on the incision until instructed by your surgeon.   ° °ACTIVITY ° °o Increase activity slowly as tolerated, but follow the weight bearing instructions below.   °o No driving for 6 weeks or until further direction given by your physician.  You cannot drive while taking narcotics.  °o No lifting or carrying greater than 10 lbs. until further directed by your surgeon. °o Avoid periods of inactivity such as sitting longer than an hour when not asleep. This helps prevent blood clots.  °o You may return to work once you are authorized by your doctor.  ° ° ° °WEIGHT BEARING  ° °Weight bearing as tolerated with assist  device (walker, cane, etc) as directed, use it as long as suggested by your surgeon or therapist, typically at least 4-6 weeks. ° ° °EXERCISES ° °Results after joint replacement surgery are often greatly improved when you follow the exercise, range of motion and muscle strengthening exercises prescribed by your doctor. Safety measures are also important to protect the joint from further injury. Any time any of these exercises cause you to have increased pain or swelling, decrease what you are doing until you are comfortable again and then slowly increase them. If you have problems or questions, call your caregiver or physical therapist for advice.  ° °Rehabilitation is important following a joint replacement. After just a few days of immobilization, the muscles of the leg can become weakened and shrink (atrophy).  These exercises are designed to build up the tone and strength of the thigh and leg muscles and to improve motion. Often times heat used for twenty to thirty minutes before working out will loosen up your tissues and help with improving the range of motion but do not use heat for the first two weeks following surgery (sometimes heat can increase post-operative swelling).  ° °These exercises can be done on a training (exercise) mat, on the floor, on a table or on a bed. Use whatever works the best and is most comfortable for you.    Use music or television while you are exercising so that   the exercises are a pleasant break in your day. This will make your life better with the exercises acting as a break in your routine that you can look forward to.   Perform all exercises about fifteen times, three times per day or as directed.  You should exercise both the operative leg and the other leg as well. ° °Exercises include: °  °• Quad Sets - Tighten up the muscle on the front of the thigh (Quad) and hold for 5-10 seconds.   °• Straight Leg Raises - With your knee straight (if you were given a brace, keep it on),  lift the leg to 60 degrees, hold for 3 seconds, and slowly lower the leg.  Perform this exercise against resistance later as your leg gets stronger.  °• Leg Slides: Lying on your back, slowly slide your foot toward your buttocks, bending your knee up off the floor (only go as far as is comfortable). Then slowly slide your foot back down until your leg is flat on the floor again.  °• Angel Wings: Lying on your back spread your legs to the side as far apart as you can without causing discomfort.  °• Hamstring Strength:  Lying on your back, push your heel against the floor with your leg straight by tightening up the muscles of your buttocks.  Repeat, but this time bend your knee to a comfortable angle, and push your heel against the floor.  You may put a pillow under the heel to make it more comfortable if necessary.  ° °A rehabilitation program following joint replacement surgery can speed recovery and prevent re-injury in the future due to weakened muscles. Contact your doctor or a physical therapist for more information on knee rehabilitation.  ° ° °CONSTIPATION ° °Constipation is defined medically as fewer than three stools per week and severe constipation as less than one stool per week.  Even if you have a regular bowel pattern at home, your normal regimen is likely to be disrupted due to multiple reasons following surgery.  Combination of anesthesia, postoperative narcotics, change in appetite and fluid intake all can affect your bowels.  ° °YOU MUST use at least one of the following options; they are listed in order of increasing strength to get the job done.  They are all available over the counter, and you may need to use some, POSSIBLY even all of these options:   ° °Drink plenty of fluids (prune juice may be helpful) and high fiber foods °Colace 100 mg by mouth twice a day  °Senokot for constipation as directed and as needed Dulcolax (bisacodyl), take with full glass of water  °Miralax (polyethylene glycol)  once or twice a day as needed. ° °If you have tried all these things and are unable to have a bowel movement in the first 3-4 days after surgery call either your surgeon or your primary doctor.   ° °If you experience loose stools or diarrhea, hold the medications until you stool forms back up.  If your symptoms do not get better within 1 week or if they get worse, check with your doctor.  If you experience "the worst abdominal pain ever" or develop nausea or vomiting, please contact the office immediately for further recommendations for treatment. ° ° °ITCHING:  If you experience itching with your medications, try taking only a single pain pill, or even half a pain pill at a time.  You can also use Benadryl over the counter for itching or also to   help with sleep.   TED HOSE STOCKINGS:  Use stockings on both legs until for at least 2 weeks or as directed by physician office. They may be removed at night for sleeping.  MEDICATIONS:  See your medication summary on the After Visit Summary that nursing will review with you.  You may have some home medications which will be placed on hold until you complete the course of blood thinner medication.  It is important for you to complete the blood thinner medication as prescribed.  PRECAUTIONS:  If you experience chest pain or shortness of breath - call 911 immediately for transfer to the hospital emergency department.   If you develop a fever greater that 101 F, purulent drainage from wound, increased redness or drainage from wound, foul odor from the wound/dressing, or calf pain - CONTACT YOUR SURGEON.                                                   FOLLOW-UP APPOINTMENTS:  If you do not already have a post-op appointment, please call the office for an appointment to be seen by your surgeon.  Guidelines for how soon to be seen are listed in your After Visit Summary, but are typically between 1-4 weeks after surgery.  OTHER INSTRUCTIONS:   Knee  Replacement:  Do not place pillow under knee, focus on keeping the knee straight while resting. CPM instructions: 0-90 degrees, 2 hours in the morning, 2 hours in the afternoon, and 2 hours in the evening. Place foam block, curve side up under heel at all times except when in CPM or when walking.  DO NOT modify, tear, cut, or change the foam block in any way.  MAKE SURE YOU:   Understand these instructions.   Get help right away if you are not doing well or get worse.    Thank you for letting us be a part of your medical care team.  It is a privilege we respect greatly.  We hope these instructions will help you stay on track for a fast and full recovery!     Information on my medicine - XARELTO (Rivaroxaban)  This medication education was reviewed with me or my healthcare representative as part of my discharge preparation.  The pharmacist that spoke with me during my hospital stay was:  Saundra Shelling, [facility]  Why was Xarelto prescribed for you? Xarelto was prescribed for you to reduce the risk of a blood clot forming that can cause a stroke if you have a medical condition called atrial fibrillation (a type of irregular heartbeat).  What do you need to know about xarelto ? Take your Xarelto ONCE DAILY at the same time every day with your evening meal. If you have difficulty swallowing the tablet whole, you may crush it and mix in applesauce just prior to taking your dose.  Take Xarelto exactly as prescribed by your doctor and DO NOT stop taking Xarelto without talking to the doctor who prescribed the medication.  Stopping without other stroke prevention medication to take the place of Xarelto may increase your risk of developing a clot that causes a stroke.  Refill your prescription before you run out.  After discharge, you should have regular check-up appointments with your healthcare provider that is prescribing your Xarelto.  In the future your dose may need to be  changed if your  kidney function or weight changes by a significant amount.  What do you do if you miss a dose? If you are taking Xarelto ONCE DAILY and you miss a dose, take it as soon as you remember on the same day then continue your regularly scheduled once daily regimen the next day. Do not take two doses of Xarelto at the same time or on the same day.   Important Safety Information A possible side effect of Xarelto is bleeding. You should call your healthcare provider right away if you experience any of the following: ? Bleeding from an injury or your nose that does not stop. ? Unusual colored urine (red or dark brown) or unusual colored stools (red or black). ? Unusual bruising for unknown reasons. ? A serious fall or if you hit your head (even if there is no bleeding).  Some medicines may interact with Xarelto and might increase your risk of bleeding while on Xarelto. To help avoid this, consult your healthcare provider or pharmacist prior to using any new prescription or non-prescription medications, including herbals, vitamins, non-steroidal anti-inflammatory drugs (NSAIDs) and supplements.  This website has more information on Xarelto: [email or web address]

## 2016-09-23 NOTE — Op Note (Signed)
NAME:  [name] [name], MOURER                  ACCOUNT NO.:  MEDICAL RECORD NO.:  [id number]  LOCATION:                                 FACILITY:  PHYSICIAN:  Alta Corning, M.D.        DATE OF BIRTH:  DATE OF PROCEDURE:  09/23/2016 DATE OF DISCHARGE:                              OPERATIVE REPORT   PREOPERATIVE DIAGNOSIS:  End-stage degenerative joint disease, left knee.  POSTOPERATIVE DIAGNOSIS:  End-stage degenerative joint disease, left knee.  PROCEDURES PERFORMED:  Left total knee replacement with an Attune system size 5 femur, size 5 tibia, 5 mm bridging bearing, and a 35 mm all- polyethylene patella.  SURGEON:  Alta Corning, M.D.  ASSISTANT:  Gary Fleet, P.A.  ANESTHESIA:  General.  BRIEF HISTORY:  [name] is a 71 year old female with a long history of complaints of left knee pain.  She had been treated conservatively with injection therapy, activity modification, and weight loss.  After failure of all conservative cares, an x-ray is showing bone-on-bone change in the lateral compartment and patellofemoral compartment.  She was taken to the operating room for a left total knee replacement.  DESCRIPTION OF PROCEDURE:  The patient was taken to the operating room. After adequate anesthesia was obtained with spinal anesthetic, the patient was placed supine on the operating table.  Left leg was prepped and draped in the usual sterile fashion.  Following this, the leg was exsanguinated.  Blood pressure tourniquet was inflated to 350 mmHg. Following this, a midline incision was made in subcutaneous tissue, down to the level of the extensor mechanism.  A medial parapatellar arthrotomy was undertaken.  Following this, the synovium in the anterior aspect of the femur, retropatellar fat pad, anterior and posterior cruciate, and medial lateral meniscus were removed.  Attention was then turned to the femur where an intramedullary pilot hole was drilled in a 5-degree valgus  inclination.  A block was pinned and a 9 mm of distal bone was resected.  Following this, attention was turned to sizing the femur, we sized this to a 5.  Anterior and posterior cuts were made, chamfers and box.  Attention was turned towards the tibia, which was cut perpendicular to its long axis.  Following this, a spacer block was put in place.  A 5 gave [place] good easy full extension.  At this time, the tibia sized to a 5 was drilled and keeled and the then trial components were put in place.  Attention was turned to the patella, cut down to a level of 13 mm and lugs were drilled for the patella.  The patellar button was put in place.  At this point, knee was put through a range of motion.  Excellent stability was achieved.  At this point, all the trial components were removed.  The knee was copiously and thoroughly lavaged with pulsatile lavage, irrigation, suctioned dry.  The final components were then cemented in place; size 5 tibia, size 5 femur, the 5 mm bridging bearing trial was placed and a 35 all-poly patella was placed and held with a clamp.  All excess bone cement was removed and the cement was allowed  to completely harden.  Once this was the case, the tourniquet was let down.  All bleeding was controlled with electrocautery.  Exparel and Marcaine with epi are mixed and instilled throughout the synovial reflection for postoperative pain control. Following this, the final polyethylene was placed.  The trial was removed.  The knee again checked for balance, range of motion, and stability, all were excellent.  The medial parapatellar arthrotomy was closed with 1 Vicryl running, skin was closed with 0 Vicryl and 2-0 Vicryl, and 3-0 Monocryl subcuticular.  Benzoin and Steri-Strips were applied.  Sterile compressive dressing was applied.  The patient was taken to the recovery room and was noted to be in satisfactory condition.  Estimated blood loss for the procedure is  minimal.     Alta Corning, M.D.     Corliss Skains  D:  09/23/2016  T:  09/23/2016  Job:  [id number]  cc:   Alta Corning, M.D.

## 2016-09-23 NOTE — Anesthesia Postprocedure Evaluation (Signed)
Anesthesia Post Note  Patient: [name] [name]  Procedure(s) Performed: Procedure(s) (LRB): TOTAL KNEE ARTHROPLASTY (Left)     Patient location during evaluation: PACU Anesthesia Type: Regional and Spinal Level of consciousness: oriented and awake and alert Pain management: pain level controlled Vital Signs Assessment: post-procedure vital signs reviewed and stable Respiratory status: spontaneous breathing, respiratory function stable and patient connected to nasal cannula oxygen Cardiovascular status: blood pressure returned to baseline and stable Postop Assessment: no headache and no backache Anesthetic complications: no    Last Vitals:  Vitals:   09/23/16 1111 09/23/16 1115  BP: 114/60   Pulse: 73 71  Resp: 14 10  Temp:  36.6 C    Last Pain:  Vitals:   09/23/16 1343  TempSrc:   PainSc: 8                  Amaad Byers P Clydean Posas

## 2016-09-23 NOTE — Brief Op Note (Signed)
09/23/2016  9:26 AM  PATIENT:  [name] [name]  71 y.o. female  PRE-OPERATIVE DIAGNOSIS:  OSTEOARTHRITIS LEFT KNEE  POST-OPERATIVE DIAGNOSIS:  OSTEOARTHRITIS LEFT KNEE  PROCEDURE:  Procedure(s): TOTAL KNEE ARTHROPLASTY (Left)  SURGEON:  Surgeon(s) and Role:    Dorna Leitz, MD - Primary  PHYSICIAN ASSISTANT:   ASSISTANTS: bethune   ANESTHESIA:   spinal  EBL:  Total I/O In: 1000 [I.V.:1000] Out: 160 [Urine:135; Blood:25]  BLOOD ADMINISTERED:none  DRAINS: none   LOCAL MEDICATIONS USED:  MARCAINE    and OTHER experel  SPECIMEN:  No Specimen  DISPOSITION OF SPECIMEN:  PATHOLOGY  COUNTS:  YES  TOURNIQUET:   Total Tourniquet Time Documented: Thigh (Left) - 70 minutes Total: Thigh (Left) - 70 minutes   DICTATION: .Other Dictation: Dictation Number [id number]  PLAN OF CARE: Admit to inpatient   PATIENT DISPOSITION:  PACU - hemodynamically stable.   Delay start of Pharmacological VTE agent (>24hrs) due to surgical blood loss or risk of bleeding: no

## 2016-09-23 NOTE — Evaluation (Addendum)
Physical Therapy Evaluation Patient Details Name: [name] [name] MRN: [id number] DOB: Jan 26, 1946 Today's Date: 09/23/2016   History of Present Illness  Pt is a 71 y/o female s/p elective L TKA secondary to L knee OA. PMH includes drepression, anxiety, HTN, breast cancer, a fib, back surgery, L bundle branch block, cardiomyopathy, s/p cardiac cath, and s/p dupuytren palmar fasciotomy.   Clinical Impression  Pt is s/p elective surgery above with deficits below. PTA, pt was independent with functional mobility and still working part time at a local school. Upon evaluation, pt with post op pain and weakness which limited mobility tolerance. Required min to min guard assist for mobility with RW this session. Pt reports she will be going to SNF at [facility] at d/c. Will continue to follow acutely to maximize functional mobility independence.     Follow Up Recommendations SNF    Equipment Recommendations  None recommended by PT    Recommendations for Other Services       Precautions / Restrictions Precautions Precautions: Knee Precaution Booklet Issued: Yes (comment) Precaution Comments: Reviewed supine ther ex with pt  Required Braces or Orthoses: Knee Immobilizer - Left Knee Immobilizer - Left:  (until discontinued ) Restrictions Weight Bearing Restrictions: Yes LLE Weight Bearing: Weight bearing as tolerated      Mobility  Bed Mobility Overal bed mobility: Needs Assistance Bed Mobility: Supine to Sit     Supine to sit: Min assist     General bed mobility comments: Min A for LLE management. Used bed rails and elevated HOB>   Transfers Overall transfer level: Needs assistance Equipment used: Rolling walker (2 wheeled) Transfers: Sit to/from Stand Sit to Stand: Min assist         General transfer comment: Min A for steadying once standigng. Verbal cues for propper hand placement.   Ambulation/Gait Ambulation/Gait assistance: Min guard Ambulation Distance (Feet):  10 Feet Assistive device: Rolling walker (2 wheeled) Gait Pattern/deviations: Step-to pattern;Decreased step length - left;Decreased weight shift to left;Antalgic Gait velocity: Decreased Gait velocity interpretation: Below normal speed for age/gender General Gait Details: Slow, antalgic gait secondary to post op pain and weakness. Verbal cues for sequencing with RW. No external assist required for balance. Distance limited secondary to pain.   Stairs            Wheelchair Mobility    Modified Rankin (Stroke Patients Only)       Balance Overall balance assessment: Needs assistance Sitting-balance support: Feet supported;No upper extremity supported Sitting balance-Leahy Scale: Good     Standing balance support: Bilateral upper extremity supported;During functional activity Standing balance-Leahy Scale: Poor Standing balance comment: Reliant on RW for stability                              Pertinent Vitals/Pain Pain Assessment: 0-10 Pain Score: 6  Pain Location: L knee Pain Descriptors / Indicators: Aching;Operative site guarding;Sore;Grimacing Pain Intervention(s): Limited activity within patient's tolerance;Premedicated before session;Monitored during session;Repositioned    Home Living Family/patient expects to be discharged to:: Skilled nursing facility Living Arrangements: Alone               Additional Comments: [facility]     Prior Function Level of Independence: Independent               Hand Dominance   Dominant Hand: Right    Extremity/Trunk Assessment   Upper Extremity Assessment Upper Extremity Assessment: Generalized weakness    Lower Extremity  Assessment Lower Extremity Assessment: LLE deficits/detail LLE Deficits / Details: Sensory in tact. Deficits consistent with post op pain and weakness. able to perform exercise below.     Cervical / Trunk Assessment Cervical / Trunk Assessment: Normal  Communication    Communication: No difficulties  Cognition Arousal/Alertness: Awake/alert Behavior During Therapy: WFL for tasks assessed/performed Overall Cognitive Status: Within Functional Limits for tasks assessed                                        General Comments General comments (skin integrity, edema, etc.): Pt's son present throughout session.     Exercises Total Joint Exercises Ankle Circles/Pumps: AROM;Both;10 reps;Supine Quad Sets: AROM;Left;10 reps;Supine Towel Squeeze: AROM;Both;10 reps;Supine Short Arc Quad: AROM;Left;10 reps;Supine Hip ABduction/ADduction: AROM;Left;10 reps;Supine   Assessment/Plan    PT Assessment Patient needs continued PT services  PT Problem List Decreased strength;Decreased range of motion;Decreased activity tolerance;Decreased balance;Decreased mobility;Decreased knowledge of use of DME;Decreased knowledge of precautions;Pain       PT Treatment Interventions DME instruction;Gait training;Functional mobility training;Therapeutic activities;Therapeutic exercise;Neuromuscular re-education;Balance training;Patient/family education    PT Goals (Current goals can be found in the Care Plan section)  Acute Rehab PT Goals Patient Stated Goal: to increase independence PT Goal Formulation: With patient Time For Goal Achievement: 09/30/16 Potential to Achieve Goals: Good    Frequency 7X/week   Barriers to discharge        Co-evaluation               AM-PAC PT "6 Clicks" Daily Activity  Outcome Measure Difficulty turning over in bed (including adjusting bedclothes, sheets and blankets)?: Total Difficulty moving from lying on back to sitting on the side of the bed? : Total Difficulty sitting down on and standing up from a chair with arms (e.g., wheelchair, bedside commode, etc,.)?: Total Help needed moving to and from a bed to chair (including a wheelchair)?: A Little Help needed walking in hospital room?: A Little Help needed  climbing 3-5 steps with a railing? : A Lot 6 Click Score: 11    End of Session Equipment Utilized During Treatment: Gait belt;Left knee immobilizer Activity Tolerance: Patient limited by pain Patient left: in chair;with call bell/phone within reach;with family/visitor present Nurse Communication: Mobility status PT Visit Diagnosis: Other abnormalities of gait and mobility (R26.89);Pain Pain - Right/Left: Left Pain - part of body: Knee    Time: 1610-9604 PT Time Calculation (min) (ACUTE ONLY): 24 min   Charges:   PT Evaluation $PT Eval Low Complexity: 1 Procedure PT Treatments $Gait Training: 8-22 mins   PT G Codes:        Leighton Ruff, PT, DPT  Acute Rehabilitation Services  Pager: [phone]   Rudean Hitt 09/23/2016, 6:52 PM

## 2016-09-23 NOTE — Progress Notes (Signed)
Orthopedic Tech Progress Note Patient Details:  [name] [name] July 28, 1945 [id number]  CPM Left Knee CPM Left Knee: On Left Knee Flexion (Degrees): 90 Left Knee Extension (Degrees): 0   [name] 09/23/2016, 11:11 AM ohf not applied because pt's weight exceeds durability of frame; RN notified

## 2016-09-23 NOTE — Anesthesia Procedure Notes (Signed)
Performed by: ELLENDER, RYAN P       

## 2016-09-23 NOTE — Transfer of Care (Signed)
Immediate Anesthesia Transfer of Care Note  Patient: [name] [name]  Procedure(s) Performed: Procedure(s): TOTAL KNEE ARTHROPLASTY (Left)  Patient Location: PACU  Anesthesia Type:Regional and Spinal  Level of Consciousness: awake, alert  and patient cooperative  Airway & Oxygen Therapy: Patient Spontanous Breathing and Patient connected to nasal cannula oxygen  Post-op Assessment: Report given to RN, Post -op Vital signs reviewed and stable, Patient moving all extremities X 4 and Patient able to stick tongue midline  Post vital signs: Reviewed and stable  Last Vitals:  Vitals:   09/23/16 0614  BP: (!) 114/58  Pulse: 64  Resp: 20  Temp: 36.9 C    Last Pain:  Vitals:   09/23/16 0614  TempSrc: Oral  PainSc: 3       Patients Stated Pain Goal: 2 (94/17/40 8144)  Complications: No apparent anesthesia complications

## 2016-09-23 NOTE — Anesthesia Procedure Notes (Signed)
Spinal  Patient location during procedure: OR Start time: 09/23/2016 7:20 AM End time: 09/23/2016 7:30 AM Staffing Anesthesiologist: Adele Barthel P Performed: anesthesiologist  Preanesthetic Checklist Completed: patient identified, surgical consent, pre-op evaluation, timeout performed, IV checked, risks and benefits discussed and monitors and equipment checked Spinal Block Patient position: sitting Prep: DuraPrep Patient monitoring: cardiac monitor, continuous pulse ox and blood pressure Approach: midline Location: L3-4 Injection technique: single-shot Needle Needle type: Quincke  Needle gauge: 22 G Needle length: 9 cm Assessment Sensory level: T10 Additional Notes Functioning IV was confirmed and monitors were applied. Sterile prep and drape, including hand hygiene and sterile gloves were used. The patient was positioned and the spine was prepped. The skin was anesthetized with lidocaine.  Free flow of clear CSF was obtained prior to injecting local anesthetic into the CSF.  The spinal needle aspirated freely following injection.  The needle was carefully withdrawn.  The patient tolerated the procedure well.

## 2016-09-23 NOTE — Progress Notes (Signed)
Orthopedic Tech Progress Note Patient Details:  [name] [name] 01-Nov-1945 [id number]  Ortho Devices Ortho Device/Splint Location: foot roll Ortho Device/Splint Interventions: Application   [name] 09/23/2016, 10:55 AM

## 2016-09-23 NOTE — Anesthesia Procedure Notes (Signed)
Anesthesia Regional Block: Adductor canal block   Pre-Anesthetic Checklist: ,, timeout performed, Correct Patient, Correct Site, Correct Laterality, Correct Procedure,, site marked, risks and benefits discussed, Surgical consent,  Pre-op evaluation,  At surgeon's request and post-op pain management  Laterality: Left  Prep: chloraprep       Needles:  Injection technique: Single-shot  Needle Type: Echogenic Stimulator Needle     Needle Length: 9cm  Needle Gauge: 21     Additional Needles:   Procedures: ultrasound guided,,,,,,,,  Narrative:  Start time: 09/23/2016 6:55 AM End time: 09/23/2016 7:05 AM Injection made incrementally with aspirations every 5 mL.  Performed by: Personally  Anesthesiologist: Adele Barthel P  Additional Notes: Functioning IV was confirmed and monitors were applied.  A 97mm 21ga Arrow echogenic stimulator needle was used. Sterile prep,hand hygiene and sterile gloves were used.  Negative aspiration and negative test dose prior to incremental administration of local anesthetic. The patient tolerated the procedure well.

## 2016-09-24 LAB — BASIC METABOLIC PANEL
ANION GAP: 7 (ref 5–15)
BUN: 14 mg/dL (ref 6–20)
CALCIUM: 8.9 mg/dL (ref 8.9–10.3)
CHLORIDE: 105 mmol/L (ref 101–111)
CO2: 23 mmol/L (ref 22–32)
Creatinine, Ser: 0.8 mg/dL (ref 0.44–1.00)
GFR calc Af Amer: >60 mL/min (ref >60)
GFR calc non Af Amer: >60 mL/min (ref >60)
GLUCOSE: 197 mg/dL — AB (ref 65–99)
Potassium: 3.8 mmol/L (ref 3.5–5.1)
Sodium: 135 mmol/L (ref 135–145)

## 2016-09-24 LAB — CBC
HCT: 31.9 % — ABNORMAL LOW (ref 36.0–46.0)
HEMOGLOBIN: 10.2 g/dL — AB (ref 12.0–15.0)
MCH: 29.2 pg (ref 26.0–34.0)
MCHC: 32 g/dL (ref 30.0–36.0)
MCV: 91.4 fL (ref 78.0–100.0)
Platelets: 246 10*3/uL (ref 150–400)
RBC: 3.49 MIL/uL — ABNORMAL LOW (ref 3.87–5.11)
RDW: 14.1 % (ref 11.5–15.5)
WBC: 16.5 10*3/uL — AB (ref 4.0–10.5)

## 2016-09-24 MED ORDER — FAMOTIDINE 20 MG PO TABS
40.0000 mg | ORAL_TABLET | Freq: Two times a day (BID) | ORAL | Status: DC
  Administered 2016-09-24 – 2016-09-25 (×3): 40 mg via ORAL
  Filled 2016-09-24 (×3): qty 2

## 2016-09-24 NOTE — NC FL2 (Signed)
[place] LEVEL OF CARE SCREENING TOOL     IDENTIFICATION  Patient Name: [name] [name] Birthdate: 18-Oct-1945 Sex: female Admission Date (Current Location): 09/23/2016  [facility] and [place] Number:  Herbalist and Address:  [facility] [place] [address] [place], [place] [address]      Provider Number: [id number]  Attending Physician Name and Address:  Dorna Leitz, MD  Relative Name and Phone Number:       Current Level of Care: Hospital Recommended Level of Care: [place] Prior Approval Number:    Date Approved/Denied:   PASRR Number: [id number]  Discharge Plan: SNF    Current Diagnoses: Patient Active Problem List   Diagnosis Date Noted  . Primary osteoarthritis of left knee 09/23/2016  . Medication dose changed 12/09/2015  . Non-ischemic cardiomyopathy ([place])   . Chronic combined systolic and diastolic heart failure, NYHA class 2 ([place]) -- exacerbated by A. fib 08/08/2015  . Paroxysmal atrial fibrillation [name]): CHA2DS2-VASc Score - starting Eliquis 08/06/2015  . Hypothyroidism 08/06/2015  . History of ulcer disease 08/06/2015  . Absolute anemia   . Encounter for Medicare annual wellness exam 03/31/2015  . DDD (degenerative disc disease), lumbar 06/16/2014  . Prediabetes 12/18/2013  . Vitamin D deficiency 12/18/2013  . Medication management 12/18/2013  . Paroxysmal supraventricular tachycardia ([place]) 10/18/2013  . Radiculopathy 10/16/2013  . Obesity (BMI 30-39.9) 06/11/2013  . Malignant neoplasm of lower-inner quadrant of left breast in female, estrogen receptor positive ([place]) 05/20/2013  . History of pancreatitis 03/27/2008  . Hyperlipidemia 03/13/2008  . ANXIETY DEPRESSION 03/13/2008  . Essential hypertension 03/13/2008  . H/O left bundle branch block 03/13/2008  . Allergic rhinitis 03/13/2008  . GERD 03/13/2008    Orientation RESPIRATION BLADDER Height & Weight     Self, Time, Place,  Situation  Normal Continent Weight: 226 lb 9.6 oz (102.8 kg) Height:  5\' 5"  (165.1 cm)  BEHAVIORAL SYMPTOMS/MOOD NEUROLOGICAL BOWEL NUTRITION STATUS      Continent    AMBULATORY STATUS COMMUNICATION OF NEEDS Skin   Limited Assist Verbally Surgical wounds                       Personal Care Assistance Level of Assistance  Bathing, Dressing Bathing Assistance: Limited assistance   Dressing Assistance: Limited assistance     Functional Limitations Info             SPECIAL CARE FACTORS FREQUENCY  PT (By licensed PT), OT (By licensed OT)     PT Frequency: 5x/wk OT Frequency: 5x/wk            Contractures      Additional Factors Info  Code Status, Allergies, Psychotropic Code Status Info: full Allergies Info: Requip Ropinirole Hcl, Minocycline, Tetracyclines & Related, Zestril Lisinopril, Zetia Ezetimibe, Amoxicillin, Codeine, Morphine Sulfate, Sulfa Antibiotics Psychotropic Info: Wellbutrin 150 mg, Cymbalta 90 mg         Current Medications (09/24/2016):  This is the current hospital active medication list Current Facility-Administered Medications  Medication Dose Route Frequency Provider Last Rate Last Dose  . 0.9 %  sodium chloride infusion   Intravenous Continuous Gary Fleet, PA-C 100 mL/hr at 09/23/16 2218    . acetaminophen (TYLENOL) tablet 650 mg  650 mg Oral Q6H PRN Gary Fleet, PA-C   650 mg at 09/23/16 1153   Or  . acetaminophen (TYLENOL) suppository 650 mg  650 mg Rectal Q6H PRN Gary Fleet, PA-C      .  alum & mag hydroxide-simeth (MAALOX/MYLANTA) 200-200-20 MG/5ML suspension 30 mL  30 mL Oral Q4H PRN Gary Fleet, PA-C      . anastrozole (ARIMIDEX) tablet 1 mg  1 mg Oral Daily Gary Fleet, PA-C      . apixaban (ELIQUIS) tablet 5 mg  5 mg Oral BID Gary Fleet, PA-C      . atorvastatin (LIPITOR) tablet 40 mg  40 mg Oral Daily Gary Fleet, PA-C   40 mg at 09/23/16 1719  . bisacodyl (DULCOLAX) EC tablet 5 mg  5 mg Oral Daily PRN  Gary Fleet, PA-C      . buPROPion (WELLBUTRIN XL) 24 hr tablet 150 mg  150 mg Oral q morning - 10a Gary Fleet, PA-C   150 mg at 09/23/16 1334  . carvedilol (COREG) tablet 12.5 mg  12.5 mg Oral BID WC Gary Fleet, PA-C   12.5 mg at 09/24/16 0839  . dexamethasone (DECADRON) injection 10 mg  10 mg Intravenous Q12H Gary Fleet, PA-C   10 mg at 09/23/16 2219  . diphenhydrAMINE (BENADRYL) 12.5 MG/5ML elixir 12.5-25 mg  12.5-25 mg Oral Q4H PRN Gary Fleet, PA-C      . docusate sodium (COLACE) capsule 100 mg  100 mg Oral BID Gary Fleet, PA-C   100 mg at 09/23/16 2218  . DULoxetine (CYMBALTA) DR capsule 90 mg  90 mg Oral q morning - 10a Gary Fleet, PA-C      . famotidine (PEPCID) tablet 40 mg  40 mg Oral BID Grier Mitts, PA-C      . ferrous sulfate tablet 325 mg  325 mg Oral BID WC Gary Fleet, PA-C   325 mg at 09/24/16 0839  . furosemide (LASIX) tablet 20 mg  20 mg Oral BID Gary Fleet, PA-C   20 mg at 09/24/16 0839  . gabapentin (NEURONTIN) capsule 300 mg  300 mg Oral BID Gary Fleet, PA-C   300 mg at 09/23/16 2218  . HYDROmorphone (DILAUDID) injection 0.5-1 mg  0.5-1 mg Intravenous Q3H PRN Gary Fleet, PA-C   1 mg at 09/24/16 0839  . irbesartan (AVAPRO) tablet 75 mg  75 mg Oral Daily Gary Fleet, PA-C   75 mg at 09/23/16 2022  . levothyroxine (SYNTHROID, LEVOTHROID) tablet 50 mcg  50 mcg Oral QAC breakfast Gary Fleet, PA-C   50 mcg at 09/24/16 6203  . magnesium citrate solution 1 Bottle  1 Bottle Oral Once PRN Gary Fleet, PA-C      . methocarbamol (ROBAXIN) tablet 500 mg  500 mg Oral Q6H PRN Gary Fleet, PA-C   500 mg at 09/24/16 0249   Or  . methocarbamol (ROBAXIN) 500 mg in dextrose 5 % 50 mL IVPB  500 mg Intravenous Q6H PRN Gary Fleet, PA-C      . ondansetron [facility]) tablet 4 mg  4 mg Oral Q6H PRN Gary Fleet, PA-C       Or  . ondansetron [facility]) injection 4 mg  4 mg Intravenous Q6H PRN Gary Fleet, PA-C      . oxyCODONE (Oxy  IR/ROXICODONE) immediate release tablet 5-10 mg  5-10 mg Oral Q3H PRN Gary Fleet, PA-C   10 mg at 09/24/16 0603  . polyethylene glycol (MIRALAX / GLYCOLAX) packet 17 g  17 g Oral Daily PRN Gary Fleet, PA-C      . temazepam (RESTORIL) capsule 30 mg  30 mg Oral QHS Dorna Leitz, MD   30 mg at 09/23/16 2218     Discharge Medications: Please see discharge summary for a  list of discharge medications.  Relevant Imaging Results:  Relevant Lab Results:   Additional Information SS#: [id number]  Geralynn Ochs, LCSW

## 2016-09-24 NOTE — Progress Notes (Signed)
   PATIENT ID: [name] [name]   1 Day Post-Op Procedure(s) (LRB): TOTAL KNEE ARTHROPLASTY (Left)  Subjective: Doing very well today. Minimal pain. Comments she is borderline diabetic. Eager to go to SNF on Sunday/monday.  Objective:  Vitals:   09/24/16 0050 09/24/16 0450  BP: 131/64 130/60  Pulse: 94 93  Resp: 16 16  Temp: 97.9 F (36.6 C) 97.9 F (36.6 C)     Left knee dressing c/d/i Wiggles toes, distally NVI  Labs:   Recent Labs  09/24/16 0532  HGB 10.2*   Recent Labs  09/24/16 0532  WBC 16.5*  RBC 3.49*  HCT 31.9*  PLT 246   Recent Labs  09/24/16 0532  NA 135  K 3.8  CL 105  CO2 23  BUN 14  CREATININE 0.80  GLUCOSE 197*  CALCIUM 8.9    Assessment and Plan: 1 day s/p left TKA Up with PT today SW consulted to help with SNF placement, d/c Sunday or Monday when bed at [facility] available WBAT Continue current pain mgmt, minimize narcotics Fu with Dr. Mayer Camel  VTE proph: ASA, SCDs

## 2016-09-24 NOTE — Clinical Social Work Note (Signed)
Clinical Social Work Assessment  Patient Details  Name: [name] [name] MRN: [id number] Date of Birth: 08/29/1945  Date of referral:  09/24/16               Reason for consult:  Facility Placement                Permission sought to share information with:  Facility Art therapist granted to share information::  Yes, Verbal Permission Granted  Name::        Agency::  SNF  Relationship::     Contact Information:     Housing/Transportation Living arrangements for the past 2 months:  Single Family Home Source of Information:  Patient Patient Interpreter Needed:  None Criminal Activity/Legal Involvement Pertinent to Current Situation/Hospitalization:  No - Comment as needed Significant Relationships:  Adult Children Lives with:  Self Do you feel safe going back to the place where you live?  Yes Need for family participation in patient care:  No (Coment)  Care giving concerns:  Patient currently lives at home alone in a two-story home, and has mobility concerns post-surgery. Patient wants to be able to walk on her own and be less at risk of a fall.   Social Worker assessment / plan:  CSW introduced self to patient and discussed recommendation for SNF. Patient acknowledged preference for [facility], and had been there previously after a different surgery. Patient discussed lengthy medical history and how she hopes that this is the last surgery she has to have for a long time. Patient asked about transportation, and CSW explained transportation options. Patient indicated she would not be safe to drive, and requested transport set up when medically ready. CSW explained role in discharge planning and will continue to follow to facilitate discharge.  Employment status:  Retired Nurse, adult PT Recommendations:  [place] / Referral to community resources:  [place]  Patient/Family's Response to  care:  Patient agreeable to SNF placement.  Patient/Family's Understanding of and Emotional Response to Diagnosis, Current Treatment, and Prognosis:  Patient seems to understand current mobility limitations and the increased level of support that she will need to recover functioning post-surgery. Patient indicated understanding of CSW role in discharge planning.  Emotional Assessment Appearance:  Appears stated age Attitude/Demeanor/Rapport:    Affect (typically observed):  Appropriate Orientation:  Oriented to Situation, Oriented to  Time, Oriented to Place, Oriented to Self Alcohol / Substance use:  Not Applicable Psych involvement (Current and /or in the community):  No (Comment)  Discharge Needs  Concerns to be addressed:  Care Coordination, Discharge Planning Concerns Readmission within the last 30 days:  No Current discharge risk:  Lives alone, Physical Impairment Barriers to Discharge:  Continued Medical Work up   [employer], [place] 09/24/2016, 3:34 PM

## 2016-09-24 NOTE — Progress Notes (Signed)
Physical Therapy Treatment Patient Details Name: [name] [name] MRN: [id number] DOB: 18-Feb-1946 Today's Date: 09/24/2016    History of Present Illness Pt is a 71 y/o female s/p elective L TKA secondary to L knee OA. PMH includes drepression, anxiety, HTN, breast cancer, a fib, back surgery, L bundle branch block, cardiomyopathy, s/p cardiac cath, and s/p dupuytren palmar fasciotomy.     PT Comments    Pt admitted with above diagnosis. Pt currently with functional limitations due to balance and endurance deficits. Pt was able to ambulate into hallway with min guard assist and cues to sequence steps and RW.  Progressing.  At times unsteady needing guard assist. Agree with SNF for therapy.   Pt will benefit from skilled PT to increase their independence and safety with mobility to allow discharge to the venue listed below.     Follow Up Recommendations  SNF     Equipment Recommendations  None recommended by PT    Recommendations for Other Services       Precautions / Restrictions Precautions Precautions: Knee Precaution Booklet Issued: Yes (comment) Precaution Comments: Reviewed supine ther ex with pt  Required Braces or Orthoses: Knee Immobilizer - Left Knee Immobilizer - Left:  (until discontinued ) Restrictions Weight Bearing Restrictions: Yes LLE Weight Bearing: Weight bearing as tolerated    Mobility  Bed Mobility Overal bed mobility: Needs Assistance Bed Mobility: Supine to Sit     Supine to sit: Min assist     General bed mobility comments: Min A for LLE management. Used bed rails and elevated HOB>   Transfers Overall transfer level: Needs assistance Equipment used: Rolling walker (2 wheeled) Transfers: Sit to/from Stand Sit to Stand: Min assist         General transfer comment: Min A for steadying once standing. Verbal cues for proper hand placement.   Ambulation/Gait Ambulation/Gait assistance: Min guard Ambulation Distance (Feet): 70 Feet Assistive  device: Rolling walker (2 wheeled) Gait Pattern/deviations: Step-to pattern;Decreased step length - left;Decreased weight shift to left;Antalgic Gait velocity: Decreased Gait velocity interpretation: Below normal speed for age/gender General Gait Details: Slow, antalgic gait secondary to post op pain and weakness. Verbal cues for sequencing with RW. Some external assist required for balance. Distance limited secondary to pain.    Stairs            Wheelchair Mobility    Modified Rankin (Stroke Patients Only)       Balance Overall balance assessment: Needs assistance Sitting-balance support: Feet supported;No upper extremity supported Sitting balance-Leahy Scale: Good     Standing balance support: Bilateral upper extremity supported;During functional activity Standing balance-Leahy Scale: Poor Standing balance comment: Reliant on RW for stability                             Cognition Arousal/Alertness: Awake/alert Behavior During Therapy: WFL for tasks assessed/performed Overall Cognitive Status: Within Functional Limits for tasks assessed                                        Exercises Total Joint Exercises Ankle Circles/Pumps: AROM;Both;10 reps;Supine Quad Sets: AROM;Left;10 reps;Supine Towel Squeeze: AROM;Both;10 reps;Supine Short Arc Quad: AROM;Left;10 reps;Supine Heel Slides: AAROM;Both;10 reps;Supine Hip ABduction/ADduction: AROM;Left;10 reps;Supine Straight Leg Raises: AROM;Both;10 reps;Supine Long Arc Quad: AROM;5 reps;Left;Seated Knee Flexion: AROM;Left;5 reps;Seated Goniometric ROM: 5-92 degrees    General Comments  Pertinent Vitals/Pain Pain Assessment: 0-10 Pain Score: 9  Pain Location: L knee Pain Descriptors / Indicators: Aching;Operative site guarding;Sore;Grimacing Pain Intervention(s): Limited activity within patient's tolerance;Monitored during session;Premedicated before session;Repositioned;Patient  requesting pain meds-RN notified    Home Living                      Prior Function            PT Goals (current goals can now be found in the care plan section) Progress towards PT goals: Progressing toward goals    Frequency    7X/week      PT Plan Current plan remains appropriate    Co-evaluation              AM-PAC PT "6 Clicks" Daily Activity  Outcome Measure  Difficulty turning over in bed (including adjusting bedclothes, sheets and blankets)?: A Little Difficulty moving from lying on back to sitting on the side of the bed? : A Little Difficulty sitting down on and standing up from a chair with arms (e.g., wheelchair, bedside commode, etc,.)?: A Little Help needed moving to and from a bed to chair (including a wheelchair)?: A Little Help needed walking in hospital room?: A Little Help needed climbing 3-5 steps with a railing? : A Lot 6 Click Score: 17    End of Session Equipment Utilized During Treatment: Gait belt;Left knee immobilizer Activity Tolerance: Patient limited by pain;Patient limited by fatigue Patient left: in chair;with call bell/phone within reach Nurse Communication: Mobility status;Patient requests pain meds PT Visit Diagnosis: Other abnormalities of gait and mobility (R26.89);Pain Pain - Right/Left: Left Pain - part of body: Knee     Time: 0233-4356 PT Time Calculation (min) (ACUTE ONLY): 27 min  Charges:  $Gait Training: 8-22 mins $Therapeutic Exercise: 8-22 mins                    G Codes:       [name] [name],PT Acute Rehabilitation [phone]   Denice Paradise 09/24/2016, 3:48 PM

## 2016-09-24 NOTE — Progress Notes (Signed)
Orthopedic Tech Progress Note Patient Details:  [name] [name] [birth year] [id number]  CPM Left Knee CPM Left Knee: On Left Knee Flexion (Degrees): 90 Left Knee Extension (Degrees): 0   [name] 09/24/2016, 3:00 PM

## 2016-09-25 LAB — CBC
HEMATOCRIT: 28.4 % — AB (ref 36.0–46.0)
Hemoglobin: 9 g/dL — ABNORMAL LOW (ref 12.0–15.0)
MCH: 29.4 pg (ref 26.0–34.0)
MCHC: 31.7 g/dL (ref 30.0–36.0)
MCV: 92.8 fL (ref 78.0–100.0)
Platelets: 235 10*3/uL (ref 150–400)
RBC: 3.06 MIL/uL — ABNORMAL LOW (ref 3.87–5.11)
RDW: 14.7 % (ref 11.5–15.5)
WBC: 18.6 10*3/uL — ABNORMAL HIGH (ref 4.0–10.5)

## 2016-09-25 NOTE — Progress Notes (Signed)
Report called to Charlestine Massed at [facility]. All questions/concerns addressed. IV removed and belongings gathered. PTAR to transport. Will continue to monitor

## 2016-09-25 NOTE — Plan of Care (Signed)
Problem: Safety: Goal: Ability to remain free from injury will improve Call bell and personal items within reach. Patient voices understanding of use of the call bell.

## 2016-09-25 NOTE — Discharge Summary (Signed)
Patient ID: [name] [name] MRN: [id number] DOB/AGE: Aug 14, 1945 71 y.o.  Admit date: 09/23/2016 Discharge date: 09/25/2016  Admission Diagnoses:  Principal Problem:   Primary osteoarthritis of left knee   Discharge Diagnoses:  Same  Past Medical History:  Diagnosis Date  . Allergy    Nasonex daily as needed  . Anemia   . Anxiety   . Arthritis   . Breast cancer ([place])     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  . Chronic back pain    cyst sitting on L4-5;slipped disc  . Depression    takes Cymbalta daily  . Diverticulosis   . Dysrhythmia   . GERD (gastroesophageal reflux disease)    takes Omeprazole daily  . Headache(784.0)    rare  . History of gastric ulcer at age 26  . History of shingles   . Hyperlipidemia    takes Atorvastatin 3 times a week  . Hypertension    takes Cardura nightly and Verapamil daily  . Hypothyroidism   . Insomnia    takes Restoril nightly  . Joint pain   . Nonischemic cardiomyopathy ([place]) 07/2015   Normal coronary arteries by cath. EF was 25-30% by echo. GR2 DD - likely related to LBBB in setting of A. fib  . Paroxysmal atrial fibrillation ([place]): CHA2DS2-VASc Score - starting Eliquis 08/06/2015   This patients CHA2DS2-VASc Score and unadjusted Ischemic Stroke Rate (% per year) is equal to 4.8 % stroke rate/year from a score of 4  Above score calculated as 1 point each if present [CHF, HTN, DM, Vascular=MI/PAD/Aortic Plaque, Age if 65-74, or Female]; 2 points each if present [Age > 75, or Stroke/TIA/TE]  . PONV (postoperative nausea and vomiting)   . S/P radiation therapy 07/18/2013-08/28/2013   1) Left Breast / 50 Gy in 25 fractions/ 2) Left Breast Boost / 10 Gy in 5 fractions  . Shingles   . Sinus drainage    put on Levaquin yesterday if no better in 3 days will start prednisone  . Thyroid disease   . Weakness    tingling and numbness both hands and left leg  . Wears glasses     Surgeries: Procedure(s): TOTAL KNEE  ARTHROPLASTY on 09/23/2016   Consultants:   Discharged Condition: Improved  Hospital Course: [name] [name] is an 71 y.o. female who was admitted 09/23/2016 for operative treatment ofPrimary osteoarthritis of left knee. Patient has severe unremitting pain that affects sleep, daily activities, and work/hobbies. After pre-op clearance the patient was taken to the operating room on 09/23/2016 and underwent  Procedure(s): TOTAL KNEE ARTHROPLASTY.    Patient was given perioperative antibiotics: Anti-infectives    Start     Dose/Rate Route Frequency Ordered Stop   09/23/16 1330  clindamycin (CLEOCIN) IVPB 600 mg     600 mg 100 mL/hr over 30 Minutes Intravenous Every 6 hours 09/23/16 1008 09/23/16 1940   09/23/16 0700  clindamycin (CLEOCIN) IVPB 900 mg     900 mg 100 mL/hr over 30 Minutes Intravenous To ShortStay Surgical 09/22/16 0954 09/23/16 0730       Patient was given sequential compression devices, early ambulation, and chemoprophylaxis to prevent DVT.  Patient benefited maximally from hospital stay and there were no complications.    Recent vital signs: Patient Vitals for the past 24 hrs:  BP Temp Temp src Pulse Resp SpO2  09/25/16 0639 130/61 98.1 F (36.7 C) Oral 94 16 98 %  09/24/16 2152 (!) 142/64 98.6 F (37 C) Oral 92  16 98 %  09/24/16 1748 121/60 - - 86 - -  09/24/16 1300 (!) 113/52 97.8 F (36.6 C) Oral 95 16 97 %     Recent laboratory studies:  Recent Labs  09/23/16 0622 09/24/16 0532 09/25/16 0406  WBC  --  16.5* 18.6*  HGB  --  10.2* 9.0*  HCT  --  31.9* 28.4*  PLT  --  246 235  NA  --  135  --   K  --  3.8  --   CL  --  105  --   CO2  --  23  --   BUN  --  14  --   CREATININE  --  0.80  --   GLUCOSE  --  197*  --   INR 0.97  --   --   CALCIUM  --  8.9  --      Discharge Medications:   Allergies as of 09/25/2016      Reactions   Requip [ropinirole Hcl] Shortness Of Breath, Nausea And Vomiting   Minocycline Hives   Tetracyclines & Related Hives    Zestril [lisinopril] Cough   Zetia [ezetimibe] Other (See Comments)   MYALGIA JOINT PAIN   Amoxicillin Rash   Has patient had a PCN reaction causing immediate rash, facial/tongue/throat swelling, SOB or lightheadedness with hypotension:No Has patient had a PCN reaction causing severe rash involving mucus membranes or skin necrosis: No Has patient had a PCN reaction that required hospitalization: No Has patient had a PCN reaction occurring within the last 10 years: No If all of the above answers are "NO", then may proceed with Cephalosporin use.   Codeine Other (See Comments)   fatigue   Morphine Sulfate Other (See Comments)   Palpitations   Sulfa Antibiotics Other (See Comments)   Headache (pt states that a blood pressure medicine caused a severe headache but doesn't remember a reaction to sulfa)      Medication List    TAKE these medications   anastrozole 1 MG tablet Commonly known as:  ARIMIDEX Take 1 tablet (1 mg total) by mouth daily.   apixaban 5 MG Tabs tablet Commonly known as:  ELIQUIS Take 1 tablet (5 mg total) by mouth 2 (two) times daily.   atorvastatin 80 MG tablet Commonly known as:  LIPITOR Take 0.5 tablets (40 mg total) by mouth daily. What changed:  when to take this   buPROPion 150 MG 24 hr tablet Commonly known as:  WELLBUTRIN XL TAKE 1 TABLET (150 MG TOTAL) BY MOUTH EVERY MORNING.   carvedilol 12.5 MG tablet Commonly known as:  COREG TAKE 1 TABLET 2 TIMES DAILY OR AS DIRECTED. What changed:  See the new instructions.   cyclobenzaprine 10 MG tablet Commonly known as:  FLEXERIL Take 10 mg by mouth at bedtime as needed (FOR RESTLESS LEGS).   docusate sodium 100 MG capsule Commonly known as:  COLACE Take 1 capsule (100 mg total) by mouth 2 (two) times daily.   DULoxetine 30 MG capsule Commonly known as:  CYMBALTA Take 1 capsule (30 mg total) by mouth 3 (three) times daily. What changed:  how much to take  when to take this   ferrous  sulfate 325 (65 FE) MG tablet TAKE 1 TABLET (325 MG TOTAL) BY MOUTH 2 (TWO) TIMES DAILY WITH A MEAL.   furosemide 20 MG tablet Commonly known as:  LASIX TAKE 1 TABLET BY MOUTH TWICE A DAY   gabapentin 300 MG capsule Commonly known  as:  NEURONTIN Take 1 capsule (300 mg total) by mouth at bedtime.   ipratropium 0.03 % nasal spray Commonly known as:  ATROVENT Place 2 sprays into the nose 3 (three) times daily. What changed:  when to take this  reasons to take this   levothyroxine 50 MCG tablet Commonly known as:  SYNTHROID, LEVOTHROID TAKE 1/2 TO 1 TABLET ON AN EMPTY STOMACH EVERY MORNING 30 TO 60 MIN BEFORE BREAKFAST   meloxicam 15 MG tablet Commonly known as:  MOBIC TAKE 1/2 TO 1 TABLET DAILY WITH FOOD FOR PAIN *DONT TAKE WITH ALEVE OR IBUPROFEN CAN TAKE TYLENOL* What changed:  See the new instructions.   mometasone 50 MCG/ACT nasal spray Commonly known as:  NASONEX USE 1 TO 2 SPRAYS IN EACH NOSTRIL TWICE DAILY What changed:  how much to take  how to take this  when to take this  reasons to take this  additional instructions   oxyCODONE-acetaminophen 5-325 MG tablet Commonly known as:  PERCOCET/ROXICET Take 1-2 tablets by mouth every 6 (six) hours as needed for severe pain.   predniSONE 20 MG tablet Commonly known as:  DELTASONE 2 tablets daily for 3 days, 1 tablet daily for 4 days.   ranitidine 300 MG tablet Commonly known as:  ZANTAC TAKE 1 TO 2 TABLETS DAILY AS NEEDED FOR ACID INDIGESTION & REFLUX What changed:  See the new instructions.   temazepam 30 MG capsule Commonly known as:  RESTORIL TAKE ONE CAPSULE AT BEDTIME AS NEEDED FOR SLEEP What changed:  See the new instructions.   traMADol 50 MG tablet Commonly known as:  ULTRAM TAKE 1 TABLET BY MOUTH 4 TIMES A DAY AS NEEDED What changed:  See the new instructions.   valsartan 80 MG tablet Commonly known as:  DIOVAN Take 80 mg by mouth daily.   Vitamin D 2000 units tablet Take 4,000 Units by  mouth daily.       Diagnostic Studies: Dg Chest 2 View  Result Date: 09/12/2016 CLINICAL DATA:  Knee replacement surgery. EXAM: CHEST  2 VIEW COMPARISON:  09/16/2015 . FINDINGS: Mediastinum and hilar structures normal. Heart size normal. Mild left base subsegmental atelectasis. Mild infiltrate cannot be excluded. Follow-up exam suggested to demonstrate clearing. No pleural effusion or pneumothorax. Surgical clips left breast. IMPRESSION: Mild left base subsegmental atelectasis. Mild infiltrate cannot be excluded. Follow-up exam suggested to demonstrate clearing. Electronically Signed   By: Marcello Moores  Register   On: 09/12/2016 16:18   Dg Knee 1-2 Views Left  Result Date: 09/23/2016 CLINICAL DATA:  Status post left total knee arthroplasty EXAM: LEFT KNEE - 1-2 VIEW COMPARISON:  None. FINDINGS: Status post left total knee arthroplasty, with well-positioned left distal femoral and left proximal tibial prostheses, with no evidence of hardware fracture or loosening. No osseous fracture. No dislocation. No suspicious focal osseous lesions. Expected gas within and surrounding the left knee joint. IMPRESSION: Satisfactory immediate postoperative appearance status post left total knee arthroplasty. Electronically Signed   By: Ilona Sorrel M.D.   On: 09/23/2016 10:41    Disposition: 01-Home or Self Care  Discharge Instructions    Call MD / Call 911    Complete by:  As directed    If you experience chest pain or shortness of breath, CALL 911 and be transported to the hospital emergency room.  If you develope a fever above 101 F, pus (white drainage) or increased drainage or redness at the wound, or calf pain, call your surgeon's office.   Constipation Prevention  Complete by:  As directed    Drink plenty of fluids.  Prune juice may be helpful.  You may use a stool softener, such as Colace (over the counter) 100 mg twice a day.  Use MiraLax (over the counter) for constipation as needed.   Diet - low sodium  heart healthy    Complete by:  As directed    Increase activity slowly as tolerated    Complete by:  As directed        Contact information for follow-up providers    Dorna Leitz, MD. Schedule an appointment as soon as possible for a visit in 2 weeks.   Specialty:  Orthopedic Surgery Contact information: [place] [address] [phone]            Contact information for after-discharge care    Destination    [facility] .   Specialty:  [place] information: [place] [phone]                   Signed: Grier Mitts 09/25/2016, 11:16 AM

## 2016-09-25 NOTE — Progress Notes (Signed)
Physical Therapy Treatment Patient Details Name: [name] [name] MRN: [id number] DOB: 11-14-1945 Today's Date: 09/25/2016    History of Present Illness Pt is a 71 y/o female s/p elective L TKA secondary to L knee OA. PMH includes drepression, anxiety, HTN, breast cancer, a fib, back surgery, L bundle branch block, cardiomyopathy, s/p cardiac cath, and s/p dupuytren palmar fasciotomy.     PT Comments    Pt demonstrates good tolerance for gait and transfers this session. Improved gait distance noted this session with a slight decrease in pain with mobility. Pt continues to benefit from SNF at discharge in order to improve mobility and assist with returning to independence prior to DC home.    Follow Up Recommendations  SNF     Equipment Recommendations  None recommended by PT    Recommendations for Other Services       Precautions / Restrictions Precautions Precautions: Knee Precaution Booklet Issued: Yes (comment) Precaution Comments: Reviewed supine ther ex with pt  Required Braces or Orthoses: Knee Immobilizer - Left Knee Immobilizer - Left: On at all times Restrictions Weight Bearing Restrictions: Yes LLE Weight Bearing: Weight bearing as tolerated    Mobility  Bed Mobility               General bed mobility comments: OOB in recliner when PT arrives  Transfers Overall transfer level: Needs assistance Equipment used: Rolling walker (2 wheeled) Transfers: Sit to/from Stand Sit to Stand: Min guard         General transfer comment: Min guard for safety, cues for hand placement   Ambulation/Gait Ambulation/Gait assistance: Min guard   Assistive device: Rolling walker (2 wheeled) Gait Pattern/deviations: Step-to pattern;Decreased step length - left;Decreased weight shift to left;Antalgic Gait velocity: Decreased Gait velocity interpretation: Below normal speed for age/gender General Gait Details: Moderate antalgic gait with slow cadence due to pain. No  hand on assist required. Good distance within [employer] Mobility    Modified Rankin (Stroke Patients Only)       Balance Overall balance assessment: Needs assistance Sitting-balance support: Feet supported;No upper extremity supported Sitting balance-Leahy Scale: Good     Standing balance support: Bilateral upper extremity supported;During functional activity Standing balance-Leahy Scale: Poor Standing balance comment: Reliant on RW for stability                             Cognition Arousal/Alertness: Awake/alert Behavior During Therapy: WFL for tasks assessed/performed Overall Cognitive Status: Within Functional Limits for tasks assessed                                        Exercises      General Comments        Pertinent Vitals/Pain Pain Assessment: 0-10 Pain Score: 5  Pain Location: L knee Pain Descriptors / Indicators: Aching;Operative site guarding;Sore;Grimacing Pain Intervention(s): Monitored during session;Premedicated before session;Repositioned;Ice applied    Home Living                      Prior Function            PT Goals (current goals can now be found in the care plan section) Acute Rehab PT Goals Patient Stated Goal: to increase independence Progress towards PT goals: Progressing toward goals  Frequency    7X/week      PT Plan Current plan remains appropriate    Co-evaluation              AM-PAC PT "6 Clicks" Daily Activity  Outcome Measure  Difficulty turning over in bed (including adjusting bedclothes, sheets and blankets)?: None Difficulty moving from lying on back to sitting on the side of the bed? : None Difficulty sitting down on and standing up from a chair with arms (e.g., wheelchair, bedside commode, etc,.)?: A Little Help needed moving to and from a bed to chair (including a wheelchair)?: A Little Help needed walking in hospital room?: A  Little Help needed climbing 3-5 steps with a railing? : A Lot 6 Click Score: 19    End of Session Equipment Utilized During Treatment: Gait belt;Left knee immobilizer Activity Tolerance: Patient tolerated treatment well Patient left: in chair;with call bell/phone within reach Nurse Communication: Mobility status PT Visit Diagnosis: Other abnormalities of gait and mobility (R26.89);Pain Pain - Right/Left: Left Pain - part of body: Knee     Time: 9450-3888 PT Time Calculation (min) (ACUTE ONLY): 25 min  Charges:  $Gait Training: 8-22 mins $Therapeutic Activity: 8-22 mins                    G Codes:       Scheryl Marten PT, DPT  [phone]    Jacqulyn Liner Sloan Leiter 09/25/2016, 12:37 PM

## 2016-09-25 NOTE — Progress Notes (Addendum)
CSW received a call from [place] at [facility] SN[facility] Pt has been accepted by: [facility] Number for report is: [phone] Pt's room/bed number will be: 43 Accepting physician: SNF MD  Pt can arrive ASAP on 09/25/16 after 2pm  CSW will update RN and EDP.   CSW has called [facility]  Please add # for report and room number to D/C packet  Alphonse Guild. Haruka Kowaleski, [place], LCAS Clinical Social Worker Ph: [phone]

## 2016-09-25 NOTE — Progress Notes (Signed)
   PATIENT ID: [name] [name]   2 Days Post-Op Procedure(s) (LRB): TOTAL KNEE ARTHROPLASTY (Left)  Subjective: Doing well, continued pain after getting up with PT but able to walk the halls. Some problems with balance and PT rec SNF.   Objective:  Vitals:   09/24/16 2152 09/25/16 0639  BP: (!) 142/64 130/61  Pulse: 92 94  Resp: 16 16  Temp: 98.6 F (37 C) 98.1 F (36.7 C)    Left knee dressing c/d/i Wiggles toes, distally NVI Calf soft, nontender   Labs:   Recent Labs  09/24/16 0532 09/25/16 0406  HGB 10.2* 9.0*   Recent Labs  09/24/16 0532 09/25/16 0406  WBC 16.5* 18.6*  RBC 3.49* 3.06*  HCT 31.9* 28.4*  PLT 246 235   Recent Labs  09/24/16 0532  NA 135  K 3.8  CL 105  CO2 23  BUN 14  CREATININE 0.80  GLUCOSE 197*  CALCIUM 8.9    Assessment and Plan: 2 days s/p left TKA Up with PT today SW found bed at [facility] available D/c today or tomorrow when transport/bed available, d/c orders in chart if pain controlled and bed availble WBAT Continue current pain mgmt, may need to stay another day if still req IV pain rx but should be weening off Fu with Dr. Mayer Camel  VTE proph: ASA, SCDs

## 2016-09-25 NOTE — Clinical Social Work Placement (Addendum)
   CLINICAL SOCIAL WORK PLACEMENT  NOTE  Date:  09/25/2016  Patient Details  Name: [name] [name] MRN: [id number] Date of Birth: Feb 23, 1946  Clinical Social Work is seeking post-discharge placement for this patient at the [place] level of care (*CSW will initial, date and re-position this form in  chart as items are completed):  Yes   Patient/family provided with [place] Work Department's list of facilities offering this level of care within the geographic area requested by the patient (or if unable, by the patient's family).  Yes   Patient/family informed of their freedom to choose among providers that offer the needed level of care, that participate in Medicare, Medicaid or managed care program needed by the patient, have an available bed and are willing to accept the patient.      Patient/family informed of [place]'s ownership interest in [facility] and [facility], as well as of the fact that they are under no obligation to receive care at these facilities.  PASRR submitted to EDS on       PASRR number received on 09/24/16     Existing PASRR number confirmed on       FL2 transmitted to all facilities in geographic area requested by pt/family on 09/24/16     FL2 transmitted to all facilities within larger geographic area on 09/24/16     Patient informed that his/her managed care company has contracts with or will negotiate with certain facilities, including the following:        Yes   Patient/family informed of bed offers received.  Patient chooses bed at [facility]     Physician recommends and patient chooses bed at [facility]    Patient to be transferred to [facility] on 09/25/16.  Patient to be transferred to facility by [facility]     Patient family notified on 09/25/16 of transfer.  Name of family member notified:      CSW called pt's son at pt's request tandinformed  pt's son pt is being transferred to [facility] SNF on 6/17.  Pt's son was appreciative and thanked the CSW.  PHYSICIAN Please prepare priority discharge summary, including medications     Additional Comment:    _______________________________________________ Claudine Mouton, LCSWA 09/25/2016, 10:57 AM

## 2016-09-26 ENCOUNTER — Encounter (HOSPITAL_COMMUNITY): Payer: Self-pay | Admitting: Orthopedic Surgery

## 2016-09-27 ENCOUNTER — Other Ambulatory Visit: Payer: Self-pay | Admitting: *Deleted

## 2016-09-27 NOTE — Patient Outreach (Signed)
[name]) Care Management  09/27/2016  [name] [name] Dec 30, 1945 [id number]   Met with patient at bedside of facility.  Patient reports she is independent prior to knee surgery, she anticipates being at facilty a couple of weeks and then going home.  She denies any [name] care management program needs at this time. RNCM left [facility] brochure for future reference.  Plan to sign off.  Royetta Crochet. Laymond Purser, RN, BSN, [facility] Care Coordinator [phone]

## 2016-10-07 ENCOUNTER — Other Ambulatory Visit: Payer: Self-pay | Admitting: Physician Assistant

## 2016-10-10 ENCOUNTER — Encounter: Payer: Self-pay | Admitting: Physician Assistant

## 2016-10-13 ENCOUNTER — Other Ambulatory Visit: Payer: Self-pay | Admitting: Internal Medicine

## 2016-10-30 ENCOUNTER — Other Ambulatory Visit: Payer: Self-pay | Admitting: Internal Medicine

## 2016-10-31 ENCOUNTER — Telehealth: Payer: Self-pay | Admitting: Cardiology

## 2016-10-31 NOTE — Telephone Encounter (Signed)
New Message: [place] Case Manager  Patient c/o Palpitations:  High priority if patient c/o lightheadedness and shortness of breath.  1. How long have you been having palpitations? Per Nira Conn; the fluttering happen last week. No specific date was given.  2. Are you currently experiencing lightheadedness and shortness of breath? Info not given to CM  3. Have you checked your BP and heart rate? (document readings) info not given to CM   4. Are you experiencing any other symptoms? Nausea  Heather ask you give the pt a call back for further information. Please call back to discuss

## 2016-10-31 NOTE — Telephone Encounter (Signed)
Called the patient back. There was no answer and no voicemail option.

## 2016-11-04 NOTE — Telephone Encounter (Signed)
Called pt back she states that yesterday she experienced another episode of fluttering in her chest she states that it lasts <2sec and she feels a little nauseous for the time that it is happening but states that she has no other sx. Her BP has been running fine 129/80's she states that it only happened once last week and then once yesterday. She states that she will discuss this with dr Ellyn Hack at her appt in august and see what he thinks at that time she states that she will call back if it happens again for further directions

## 2016-11-23 NOTE — Progress Notes (Signed)
CPE AND 3 month OV  Assessment and Plan:   Essential hypertension - continue medications, DASH diet, exercise and monitor at home. Call if greater than 130/80.   -     CBC with Differential/Platelet -     BASIC METABOLIC PANEL WITH GFR -     Hepatic function panel -     TSH  Paroxysmal atrial fibrillation ([place]): CHA2DS2-VASc Score - starting Eliquis Control blood pressure, cholesterol, glucose, increase exercise.  Continue cardio follow up  Chronic combined systolic and diastolic heart failure, NYHA class 2 ([place]) -- exacerbated by A. Fib Weight stable Suggested increasing coreg to 25mg  BID to optimize meds  Non-ischemic cardiomyopathy (HCC) Weight stable Optimize meds  Paroxysmal supraventricular tachycardia (HCC) Control blood pressure, cholesterol, glucose, increase exercise.  Continue cardio follow up  Non-seasonal allergic rhinitis, unspecified trigger Allergic rhinitis - Allegra OTC, increase H20, allergy hygiene explained.  Gastroesophageal reflux disease without esophagitis Continue PPI/H2 blocker, diet discussed  Other specified hypothyroidism Hypothyroidism-check TSH level, continue medications the same, reminded to take on an empty stomach 30-61mins before food.  -     TSH  Lumbar radiculopathy Follow up ortho  DDD (degenerative disc disease), lumbar Follow up ortho  Hyperlipidemia -continue medications, check lipids, decrease fatty foods, increase activity.  -     Lipid panel  H/O left bundle branch block Continue cardio follow up  ANXIETY DEPRESSION Continue meds  History of pancreatitis Monitor, no ETOH  Malignant neoplasm of lower-inner quadrant of left breast in female, estrogen receptor positive ([place]) Continue follow up oncology, continue meds  Morbid Obesity with co morbidities - long discussion about weight loss, diet, and exercise  Prediabetes Discussed general issues about diabetes pathophysiology and management., Educational  material distributed., Suggested low cholesterol diet., Encouraged aerobic exercise., Discussed foot care., Reminded to get yearly retinal exam. -     Hemoglobin A1c  Vitamin D deficiency  Medication management -     Magnesium  Encounter for Medicare annual wellness exam  History of ulcer disease monitor  Anemia, unspecified type -     Iron and TIBC -     Vitamin B12   Future Appointments Date Time Provider [place]  11/29/2016 4:00 PM Leonie Man, MD [facility]  07/13/2017 1:00 PM [facility] None  07/13/2017 1:30 PM Magrinat, Virgie Dad, MD [facility] None  11/30/2017 10:00 AM Vicie Mutters, PA-C [facility] None     Subjective:  [name] [name] is a 71 y.o. female who presents for CPE and 3 month follow up.  She has had elevated blood pressure for  years. Her blood pressure has been controlled at home, today their BP is BP: 130/80 She does not workout regularly but does walk with her dog. She denies chest pain, shortness of breath, dizziness.  She has afib, on eliquis, follows with cardio. Occ flutters with walking.  Has never had sleep study. She is on cholesterol medication, lipitor 80mg  1/2 pill every day and denies myalgias. Her cholesterol is at goal. The cholesterol last visit was:   Lab Results  Component Value Date   CHOL 168 08/10/2016   HDL 47 (L) 08/10/2016   LDLCALC 82 08/10/2016   TRIG 194 (H) 08/10/2016   CHOLHDL 3.6 08/10/2016   She has been working on diet and exercise for prediabetes, and denies paresthesia of the feet, polydipsia, polyuria and visual disturbances. Last A1C in the office was:  Lab Results  Component Value Date   HGBA1C 6.0 (H) 08/10/2016  Patient is on Vitamin D supplement.   Lab Results  Component Value Date   VD25OH 49 12/31/2015   Follows with Dr. [name] for lower back pain.  She is on thyroid medication. Her medication was not changed last visit.   Lab Results  Component Value Date   TSH  1.90 08/10/2016  On cymbalta for depression.  Follows with Dr. Griffith Citron for left breast cancer, had lumpectomy and radiation.  BMI is Body mass index is 36.38 kg/m., she is working on diet and exercise. Wt Readings from Last 3 Encounters:  11/24/16 222 lb (100.7 kg)  09/23/16 226 lb 9.6 oz (102.8 kg)  09/12/16 226 lb 9.6 oz (102.8 kg)     Medication Review: Current Outpatient Prescriptions on File Prior to Visit  Medication Sig Dispense Refill  . anastrozole (ARIMIDEX) 1 MG tablet Take 1 tablet (1 mg total) by mouth daily. 90 tablet 3  . apixaban (ELIQUIS) 5 MG TABS tablet Take 1 tablet (5 mg total) by mouth 2 (two) times daily. 180 tablet 3  . atorvastatin (LIPITOR) 80 MG tablet Take 0.5 tablets (40 mg total) by mouth daily. (Patient taking differently: Take 40 mg by mouth every evening. ) 90 tablet 3  . buPROPion (WELLBUTRIN XL) 150 MG 24 hr tablet TAKE 1 TABLET (150 MG TOTAL) BY MOUTH EVERY MORNING. 90 tablet 1  . carvedilol (COREG) 12.5 MG tablet TAKE 1 TABLET 2 TIMES DAILY OR AS DIRECTED. 180 tablet 0  . Cholecalciferol (VITAMIN D) 2000 UNITS tablet Take 4,000 Units by mouth daily.    . cyclobenzaprine (FLEXERIL) 10 MG tablet Take 10 mg by mouth at bedtime as needed (FOR RESTLESS LEGS).   1  . docusate sodium (COLACE) 100 MG capsule Take 1 capsule (100 mg total) by mouth 2 (two) times daily. 30 capsule 0  . DULoxetine (CYMBALTA) 30 MG capsule Take 1 capsule (30 mg total) by mouth 3 (three) times daily. (Patient taking differently: Take 90 mg by mouth every morning. ) 270 capsule 3  . ferrous sulfate 325 (65 FE) MG tablet TAKE 1 TABLET (325 MG TOTAL) BY MOUTH 2 (TWO) TIMES DAILY WITH A MEAL. 180 tablet 3  . furosemide (LASIX) 20 MG tablet TAKE 1 TABLET BY MOUTH TWICE A DAY 180 tablet 1  . gabapentin (NEURONTIN) 300 MG capsule Take 1 capsule (300 mg total) by mouth at bedtime. 90 capsule 4  . ipratropium (ATROVENT) 0.03 % nasal spray Place 2 sprays into the nose 3 (three) times daily.  (Patient taking differently: Place 2 sprays into the nose 3 (three) times daily as needed (for allergies/runny nose). ) 54 mL 3  . levothyroxine (SYNTHROID, LEVOTHROID) 50 MCG tablet TAKE 1/2 TO 1 TABLET ON AN EMPTY STOMACH EVERY MORNING 30 TO 60 MIN BEFORE BREAKFAST 90 tablet 1  . meloxicam (MOBIC) 15 MG tablet TAKE 1/2 TO 1 TABLET DAILY WITH FOOD FOR PAIN *DONT TAKE WITH ALEVE OR IBUPROFEN CAN TAKE TYLENOL* 90 tablet 1  . mometasone (NASONEX) 50 MCG/ACT nasal spray USE 1 TO 2 SPRAYS IN EACH NOSTRIL TWICE DAILY (Patient taking differently: Place 1-2 sprays into the nose daily as needed (FOR ALLERGIES/RUNNY NOSE). ) 17 g 99  . oxyCODONE-acetaminophen (PERCOCET/ROXICET) 5-325 MG tablet Take 1-2 tablets by mouth every 6 (six) hours as needed for severe pain. 60 tablet 0  . ranitidine (ZANTAC) 300 MG tablet TAKE 1 TO 2 TABLETS DAILY AS NEEDED FOR ACID INDIGESTION & REFLUX (Patient taking differently: TAKE 1 TWICE DAILY) 180 tablet 1  .  temazepam (RESTORIL) 30 MG capsule TAKE ONE CAPSULE AT BEDTIME AS NEEDED FOR SLEEP (Patient taking differently: TAKE ONE CAPSULE AT BEDTIME) 90 capsule 1  . traMADol (ULTRAM) 50 MG tablet TAKE 1 TABLET BY MOUTH 4 TIMES A DAY AS NEEDED (Patient taking differently: TAKE 1 TABLET BY MOUTH 4 TIMES A DAY AS NEEDED FOR PAIN.) 120 tablet 0  . valsartan (DIOVAN) 80 MG tablet Take 80 mg by mouth daily.      No current facility-administered medications on file prior to visit.     Current Problems (verified) Patient Active Problem List   Diagnosis Date Noted  . Primary osteoarthritis of left knee 09/23/2016  . Non-ischemic cardiomyopathy ([place])   . Chronic combined systolic and diastolic heart failure, NYHA class 2 ([place]) -- exacerbated by A. fib 08/08/2015  . Paroxysmal atrial fibrillation [facility]): CHA2DS2-VASc Score - starting Eliquis 08/06/2015  . Hypothyroidism 08/06/2015  . History of ulcer disease 08/06/2015  . Absolute anemia   . Encounter for Medicare annual wellness exam  03/31/2015  . DDD (degenerative disc disease), lumbar 06/16/2014  . Prediabetes 12/18/2013  . Vitamin D deficiency 12/18/2013  . Medication management 12/18/2013  . Paroxysmal supraventricular tachycardia ([place]) 10/18/2013  . Radiculopathy 10/16/2013  . Obesity (BMI 30-39.9) 06/11/2013  . Malignant neoplasm of lower-inner quadrant of left breast in female, estrogen receptor positive ([place]) 05/20/2013  . History of pancreatitis 03/27/2008  . Hyperlipidemia 03/13/2008  . Depression, major, recurrent, in partial remission ([place]) 03/13/2008  . Essential hypertension 03/13/2008  . H/O left bundle branch block 03/13/2008  . Allergic rhinitis 03/13/2008  . GERD 03/13/2008    Screening Tests Immunization History  Administered Date(s) Administered  . Influenza Whole 01/01/2013  . Influenza, High Dose Seasonal PF 01/29/2014, 12/30/2014  . Influenza-Unspecified 12/11/2015  . Pneumococcal Conjugate-13 06/16/2014  . Pneumococcal Polysaccharide-23 05/23/2011  . Tdap 03/11/2008  . Zoster 02/02/2010   Preventative care: Last colonoscopy: 2014 Last mammogram: 05/2016  (05/06/2013 + right breast cancer s/p lumpectomy) Last pap smear/pelvic exam: remote   DEXA: 12/2013- normal Renal US 08/2015 Echo 2008  Prior vaccinations: TD or Tdap: 2009  Influenza: 2017 Pneumococcal: 2013 Prevnar 13: 2016 Shingles/Zostavax: 2011  Names of Other Physician/Practitioners you currently use: 1. [facility] here for primary care 2. Dr [facility], eye doctor, last visit April 2015 3. Dr. Tye Savoy, dentist, last visit q 6 months Patient Care Team: Unk Pinto, MD as PCP - General (Internal Medicine) Magrinat, Virgie Dad, MD as Consulting Physician (Oncology) Inda Castle, MD as Consulting Physician (Gastroenterology) Leonie Man, MD as Consulting Physician (Cardiology) Dorna Leitz, MD as Consulting Physician (Orthopedic  Surgery)  Allergies Allergies  Allergen Reactions  . Requip [Ropinirole Hcl] Shortness Of Breath and Nausea And Vomiting  . Minocycline Hives  . Tetracyclines & Related Hives  . Zestril [Lisinopril] Cough  . Zetia [Ezetimibe] Other (See Comments)    MYALGIA JOINT PAIN  . Amoxicillin Rash     Has patient had a PCN reaction causing immediate rash, facial/tongue/throat swelling, SOB or lightheadedness with hypotension:No Has patient had a PCN reaction causing severe rash involving mucus membranes or skin necrosis: No Has patient had a PCN reaction that required hospitalization: No Has patient had a PCN reaction occurring within the last 10 years: No If all of the above answers are "NO", then may proceed with Cephalosporin use.   . Codeine Other (See Comments)    fatigue  . Morphine Sulfate Other (See Comments)    Palpitations  .  Sulfa Antibiotics Other (See Comments)    Headache (pt states that a blood pressure medicine caused a severe headache but doesn't remember a reaction to sulfa)    SURGICAL HISTORY She  has a past surgical history that includes Cataract extraction bilateral w/ anterior vitrectomy (Bilateral); Abdominal hysterectomy; Cholecystectomy; Knee arthroscopy (Bilateral); Tonsillectomy; Colonoscopy (2014); Upper gi endoscopy; Dupuytren / palmar fasciotomy (Bilateral, 2007); Tubal ligation; epidural injections; Lumbar fusion (10/2013); Cardiac catheterization (N/A, 08/10/2015); transthoracic echocardiogram (08/07/2015); transthoracic echocardiogram (10/2015); Joint replacement; Total knee arthroplasty (Left, 09/23/2016); Breast lumpectomy (Left); Foot Tendon Surgery (Right, ~ 11/2015); and Total knee arthroplasty (Left, 09/23/2016). FAMILY HISTORY Her family history includes COPD in her father; Heart attack in her mother; Hypertension in her father; Stroke in her mother. SOCIAL HISTORY She  reports that she has never smoked. She has never used smokeless tobacco. She reports  that she drinks alcohol. She reports that she does not use drugs.  Review of Systems  Constitutional: Negative for chills, fever and malaise/fatigue.  HENT: Negative for congestion, ear pain and sore throat.   Eyes: Negative.   Respiratory: Negative for cough, shortness of breath and wheezing.   Cardiovascular: Positive for palpitations (flutters occ). Negative for chest pain and leg swelling.  Gastrointestinal: Negative for abdominal pain, blood in stool, constipation, diarrhea, heartburn and melena.  Genitourinary: Negative.   Musculoskeletal: Positive for joint pain.  Skin: Negative.   Neurological: Negative for dizziness, sensory change, loss of consciousness and headaches.  Psychiatric/Behavioral: Negative for depression. The patient is not nervous/anxious and does not have insomnia.      Objective:     Blood pressure 130/80, pulse (!) 105, temperature 97.6 F (36.4 C), resp. rate 14, height 5' 5.5" (1.664 m), weight 222 lb (100.7 kg), SpO2 98 %. Body mass index is 36.38 kg/m.  General appearance: alert, no distress, WD/WN, female HEENT: normocephalic, sclerae anicteric, TMs pearly, nares patent, no discharge or erythema, pharynx normal, + maxillary sinus tenderness to palpation.  Oral cavity: MMM, no lesions Neck: supple, no lymphadenopathy, no thyromegaly, no masses Heart: RRR, normal S1, S2, no murmurs Lungs: CTA bilaterally, no wheezes, rhonchi, or rales Abdomen: +bs, soft, obese, + epigastric tenderness, non distended, no masses, no hepatomegaly, no splenomegaly Musculoskeletal: nontender, no swelling, no obvious deformity Extremities: no edema, no cyanosis, no clubbing Pulses: 2+ symmetric, upper and lower extremities, normal cap refill Neurological: alert, oriented x 3, CN2-12 intact, strength normal upper extremities and lower extremities, sensation normal throughout, DTRs 2+ throughout, no cerebellar signs, gait antalgic Psychiatric: normal affect, behavior normal,  pleasant    Vicie Mutters, PA-C   11/24/2016

## 2016-11-24 ENCOUNTER — Encounter: Payer: Self-pay | Admitting: Physician Assistant

## 2016-11-24 ENCOUNTER — Ambulatory Visit (INDEPENDENT_AMBULATORY_CARE_PROVIDER_SITE_OTHER): Payer: Medicare Other | Admitting: Physician Assistant

## 2016-11-24 VITALS — BP 130/80 | HR 105 | Temp 97.6°F | Resp 14 | Ht 65.5 in | Wt 222.0 lb

## 2016-11-24 DIAGNOSIS — Z0000 Encounter for general adult medical examination without abnormal findings: Secondary | ICD-10-CM

## 2016-11-24 DIAGNOSIS — M1712 Unilateral primary osteoarthritis, left knee: Secondary | ICD-10-CM

## 2016-11-24 DIAGNOSIS — M51369 Other intervertebral disc degeneration, lumbar region without mention of lumbar back pain or lower extremity pain: Secondary | ICD-10-CM

## 2016-11-24 DIAGNOSIS — Z0001 Encounter for general adult medical examination with abnormal findings: Secondary | ICD-10-CM

## 2016-11-24 DIAGNOSIS — E669 Obesity, unspecified: Secondary | ICD-10-CM

## 2016-11-24 DIAGNOSIS — R7303 Prediabetes: Secondary | ICD-10-CM

## 2016-11-24 DIAGNOSIS — I471 Supraventricular tachycardia, unspecified: Secondary | ICD-10-CM

## 2016-11-24 DIAGNOSIS — K219 Gastro-esophageal reflux disease without esophagitis: Secondary | ICD-10-CM

## 2016-11-24 DIAGNOSIS — E782 Mixed hyperlipidemia: Secondary | ICD-10-CM

## 2016-11-24 DIAGNOSIS — I5042 Chronic combined systolic (congestive) and diastolic (congestive) heart failure: Secondary | ICD-10-CM

## 2016-11-24 DIAGNOSIS — M5416 Radiculopathy, lumbar region: Secondary | ICD-10-CM

## 2016-11-24 DIAGNOSIS — M5136 Other intervertebral disc degeneration, lumbar region: Secondary | ICD-10-CM

## 2016-11-24 DIAGNOSIS — D649 Anemia, unspecified: Secondary | ICD-10-CM

## 2016-11-24 DIAGNOSIS — Z79899 Other long term (current) drug therapy: Secondary | ICD-10-CM

## 2016-11-24 DIAGNOSIS — Z87898 Personal history of other specified conditions: Secondary | ICD-10-CM

## 2016-11-24 DIAGNOSIS — E038 Other specified hypothyroidism: Secondary | ICD-10-CM

## 2016-11-24 DIAGNOSIS — I428 Other cardiomyopathies: Secondary | ICD-10-CM

## 2016-11-24 DIAGNOSIS — Z8679 Personal history of other diseases of the circulatory system: Secondary | ICD-10-CM

## 2016-11-24 DIAGNOSIS — Z170 Estrogen receptor positive status [ER+]: Secondary | ICD-10-CM

## 2016-11-24 DIAGNOSIS — I480 Paroxysmal atrial fibrillation: Secondary | ICD-10-CM

## 2016-11-24 DIAGNOSIS — E559 Vitamin D deficiency, unspecified: Secondary | ICD-10-CM

## 2016-11-24 DIAGNOSIS — C50312 Malignant neoplasm of lower-inner quadrant of left female breast: Secondary | ICD-10-CM

## 2016-11-24 DIAGNOSIS — I10 Essential (primary) hypertension: Secondary | ICD-10-CM

## 2016-11-24 DIAGNOSIS — Z8719 Personal history of other diseases of the digestive system: Secondary | ICD-10-CM

## 2016-11-24 DIAGNOSIS — J3089 Other allergic rhinitis: Secondary | ICD-10-CM

## 2016-11-24 MED ORDER — TRAMADOL HCL 50 MG PO TABS: 50.0000 mg | ORAL_TABLET | Freq: Two times a day (BID) | ORAL | 0 refills | Status: DC | PRN

## 2016-11-24 MED ORDER — CARVEDILOL 25 MG PO TABS: ORAL_TABLET | ORAL | 1 refills | Status: DC

## 2016-11-24 NOTE — Patient Instructions (Addendum)
Suggest calling your insurance and see who is covered Ask if they do cognitive behavioral therapy Can youtube it as well, CBT therapy  Increase coreg to 39m twice a day  Preventive Care for Adults A healthy lifestyle and preventive care can promote health and wellness. Preventive health guidelines for women include the following key practices.  A routine yearly physical is a good way to check with your health care provider about your health and preventive screening. It is a chance to share any concerns and updates on your health and to receive a thorough exam.  Visit your dentist for a routine exam and preventive care every 6 months. Brush your teeth twice a day and floss once a day. Good oral hygiene prevents tooth decay and gum disease.  The frequency of eye exams is based on your age, health, family medical history, use of contact lenses, and other factors. Follow your health care provider's recommendations for frequency of eye exams.  Eat a healthy diet. Foods like vegetables, fruits, whole grains, low-fat dairy products, and lean protein foods contain the nutrients you need without too many calories. Decrease your intake of foods high in solid fats, added sugars, and salt. Eat the right amount of calories for you.Get information about a proper diet from your health care provider, if necessary.  Regular physical exercise is one of the most important things you can do for your health. Most adults should get at least 150 minutes of moderate-intensity exercise (any activity that increases your heart rate and causes you to sweat) each week. In addition, most adults need muscle-strengthening exercises on 2 or more days a week.  Maintain a healthy weight. The body mass index (BMI) is a screening tool to identify possible weight problems. It provides an estimate of body fat based on height and weight. Your health care provider can find your BMI and can help you achieve or maintain a healthy  weight.For adults 20 years and older:  A BMI below 18.5 is considered underweight.  A BMI of 18.5 to 24.9 is normal.  A BMI of 25 to 29.9 is considered overweight.  A BMI of 30 and above is considered obese.  Maintain normal blood lipids and cholesterol levels by exercising and minimizing your intake of saturated fat. Eat a balanced diet with plenty of fruit and vegetables. If your lipid or cholesterol levels are high, you are over 50, or you are at high risk for heart disease, you may need your cholesterol levels checked more frequently.Ongoing high lipid and cholesterol levels should be treated with medicines if diet and exercise are not working.  If you smoke, find out from your health care provider how to quit. If you do not use tobacco, do not start.  Lung cancer screening is recommended for adults aged 572-80years who are at high risk for developing lung cancer because of a history of smoking. A yearly low-dose CT scan of the lungs is recommended for people who have at least a 30-pack-year history of smoking and are a current smoker or have quit within the past 15 years. A pack year of smoking is smoking an average of 1 pack of cigarettes a day for 1 year (for example: 1 pack a day for 30 years or 2 packs a day for 15 years). Yearly screening should continue until the smoker has stopped smoking for at least 15 years. Yearly screening should be stopped for people who develop a health problem that would prevent them from having lung  cancer treatment.  Avoid use of street drugs. Do not share needles with anyone. Ask for help if you need support or instructions about stopping the use of drugs.  High blood pressure causes heart disease and increases the risk of stroke.  Ongoing high blood pressure should be treated with medicines if weight loss and exercise do not work.  If you are 4-68 years old, ask your health care provider if you should take aspirin to prevent strokes.  Diabetes  screening involves taking a blood sample to check your fasting blood sugar level. This should be done once every 3 years, after age 71, if you are within normal weight and without risk factors for diabetes. Testing should be considered at a younger age or be carried out more frequently if you are overweight and have at least 1 risk factor for diabetes.  Breast cancer screening is essential preventive care for women. You should practice "breast self-awareness." This means understanding the normal appearance and feel of your breasts and may include breast self-examination. Any changes detected, no matter how small, should be reported to a health care provider. Women in their 46s and 30s should have a clinical breast exam (CBE) by a health care provider as part of a regular health exam every 1 to 3 years. After age 21, women should have a CBE every year. Starting at age 22, women should consider having a mammogram (breast X-ray test) every year. Women who have a family history of breast cancer should talk to their health care provider about genetic screening. Women at a high risk of breast cancer should talk to their health care providers about having an MRI and a mammogram every year.  Breast cancer gene (BRCA)-related cancer risk assessment is recommended for women who have family members with BRCA-related cancers. BRCA-related cancers include breast, ovarian, tubal, and peritoneal cancers. Having family members with these cancers may be associated with an increased risk for harmful changes (mutations) in the breast cancer genes BRCA1 and BRCA2. Results of the assessment will determine the need for genetic counseling and BRCA1 and BRCA2 testing.  Routine pelvic exams to screen for cancer are no longer recommended for nonpregnant women who are considered low risk for cancer of the pelvic organs (ovaries, uterus, and vagina) and who do not have symptoms. Ask your health care provider if a screening pelvic exam is  right for you.  If you have had past treatment for cervical cancer or a condition that could lead to cancer, you need Pap tests and screening for cancer for at least 20 years after your treatment. If Pap tests have been discontinued, your risk factors (such as having a new sexual partner) need to be reassessed to determine if screening should be resumed. Some women have medical problems that increase the chance of getting cervical cancer. In these cases, your health care provider may recommend more frequent screening and Pap tests.    Colorectal cancer can be detected and often prevented. Most routine colorectal cancer screening begins at the age of 92 years and continues through age 28 years. However, your health care provider may recommend screening at an earlier age if you have risk factors for colon cancer. On a yearly basis, your health care provider may provide home test kits to check for hidden blood in the stool. Use of a small camera at the end of a tube, to directly examine the colon (sigmoidoscopy or colonoscopy), can detect the earliest forms of colorectal cancer. Talk to your health  care provider about this at age 39, when routine screening begins. Direct exam of the colon should be repeated every 5-10 years through age 62 years, unless early forms of pre-cancerous polyps or small growths are found.  Osteoporosis is a disease in which the bones lose minerals and strength with aging. This can result in serious bone fractures or breaks. The risk of osteoporosis can be identified using a bone density scan. Women ages 31 years and over and women at risk for fractures or osteoporosis should discuss screening with their health care providers. Ask your health care provider whether you should take a calcium supplement or vitamin D to reduce the rate of osteoporosis.  Menopause can be associated with physical symptoms and risks. Hormone replacement therapy is available to decrease symptoms and risks.  You should talk to your health care provider about whether hormone replacement therapy is right for you.  Use sunscreen. Apply sunscreen liberally and repeatedly throughout the day. You should seek shade when your shadow is shorter than you. Protect yourself by wearing long sleeves, pants, a wide-brimmed hat, and sunglasses year round, whenever you are outdoors.  Once a month, do a whole body skin exam, using a mirror to look at the skin on your back. Tell your health care provider of new moles, moles that have irregular borders, moles that are larger than a pencil eraser, or moles that have changed in shape or color.  Stay current with required vaccines (immunizations).  Influenza vaccine. All adults should be immunized every year.  Tetanus, diphtheria, and acellular pertussis (Td, Tdap) vaccine. Pregnant women should receive 1 dose of Tdap vaccine during each pregnancy. The dose should be obtained regardless of the length of time since the last dose. Immunization is preferred during the 27th-36th week of gestation. An adult who has not previously received Tdap or who does not know her vaccine status should receive 1 dose of Tdap. This initial dose should be followed by tetanus and diphtheria toxoids (Td) booster doses every 10 years. Adults with an unknown or incomplete history of completing a 3-dose immunization series with Td-containing vaccines should begin or complete a primary immunization series including a Tdap dose. Adults should receive a Td booster every 10 years.    Zoster vaccine. One dose is recommended for adults aged 47 years or older unless certain conditions are present.    Pneumococcal 13-valent conjugate (PCV13) vaccine. When indicated, a person who is uncertain of her immunization history and has no record of immunization should receive the PCV13 vaccine. An adult aged 35 years or older who has certain medical conditions and has not been previously immunized should receive 1  dose of PCV13 vaccine. This PCV13 should be followed with a dose of pneumococcal polysaccharide (PPSV23) vaccine. The PPSV23 vaccine dose should be obtained at least 8 weeks after the dose of PCV13 vaccine. An adult aged 43 years or older who has certain medical conditions and previously received 1 or more doses of PPSV23 vaccine should receive 1 dose of PCV13. The PCV13 vaccine dose should be obtained 1 or more years after the last PPSV23 vaccine dose.    Pneumococcal polysaccharide (PPSV23) vaccine. When PCV13 is also indicated, PCV13 should be obtained first. All adults aged 20 years and older should be immunized. An adult younger than age 4 years who has certain medical conditions should be immunized. Any person who resides in a nursing home or long-term care facility should be immunized. An adult smoker should be immunized. People with  an immunocompromised condition and certain other conditions should receive both PCV13 and PPSV23 vaccines. People with human immunodeficiency virus (HIV) infection should be immunized as soon as possible after diagnosis. Immunization during chemotherapy or radiation therapy should be avoided. Routine use of PPSV23 vaccine is not recommended for American Indians, [place] Natives, or people younger than 65 years unless there are medical conditions that require PPSV23 vaccine. When indicated, people who have unknown immunization and have no record of immunization should receive PPSV23 vaccine. One-time revaccination 5 years after the first dose of PPSV23 is recommended for people aged 19-64 years who have chronic kidney failure, nephrotic syndrome, asplenia, or immunocompromised conditions. People who received 1-2 doses of PPSV23 before age 50 years should receive another dose of PPSV23 vaccine at age 34 years or later if at least 5 years have passed since the previous dose. Doses of PPSV23 are not needed for people immunized with PPSV23 at or after age 14 years.   Preventive  Services / Frequency  Ages 45 years and over  Blood pressure check.  Lipid and cholesterol check.  Lung cancer screening. / Every year if you are aged 76-80 years and have a 30-pack-year history of smoking and currently smoke or have quit within the past 15 years. Yearly screening is stopped once you have quit smoking for at least 15 years or develop a health problem that would prevent you from having lung cancer treatment.  Clinical breast exam.** / Every year after age 32 years.  BRCA-related cancer risk assessment.** / For women who have family members with a BRCA-related cancer (breast, ovarian, tubal, or peritoneal cancers).  Mammogram.** / Every year beginning at age 64 years and continuing for as long as you are in good health. Consult with your health care provider.  Pap test.** / Every 3 years starting at age 21 years through age 61 or 101 years with 3 consecutive normal Pap tests. Testing can be stopped between 65 and 70 years with 3 consecutive normal Pap tests and no abnormal Pap or HPV tests in the past 10 years.  Fecal occult blood test (FOBT) of stool. / Every year beginning at age 43 years and continuing until age 67 years. You may not need to do this test if you get a colonoscopy every 10 years.  Flexible sigmoidoscopy or colonoscopy.** / Every 5 years for a flexible sigmoidoscopy or every 10 years for a colonoscopy beginning at age 75 years and continuing until age 71 years.  Hepatitis C blood test.** / For all people born from [birth year] through 1965 and any individual with known risks for hepatitis C.  Osteoporosis screening.** / A one-time screening for women ages 77 years and over and women at risk for fractures or osteoporosis.  Skin self-exam. / Monthly.  Influenza vaccine. / Every year.  Tetanus, diphtheria, and acellular pertussis (Tdap/Td) vaccine.** / 1 dose of Td every 10 years.  Zoster vaccine.** / 1 dose for adults aged 7 years or older.  Pneumococcal  13-valent conjugate (PCV13) vaccine.** / Consult your health care provider.  Pneumococcal polysaccharide (PPSV23) vaccine.** / 1 dose for all adults aged 77 years and older. Screening for abdominal aortic aneurysm (AAA)  by ultrasound is recommended for people who have history of high blood pressure or who are current or former smokers.   Vascular Dementia Dementia is a condition in which a person has problems with thinking, memory, and behavior that are severe enough to interfere with daily life. Vascular dementia is a type of  dementia. It results from brain damage that is caused by the brain not getting enough blood. Vascular dementia usually begins between 24 and 45 years of age. What are the causes? Vascular dementia is caused by conditions that lessen blood flow to the brain. Common causes include:  Multiple small strokes. These may happen without symptoms (silent stroke).  Major stroke.  Damage to small blood vessels in the brain (cerebral small vessel disease).  What increases the risk?  Advancing age.  Having had a stroke.  Having high blood pressure (hypertension) or high cholesterol.  Having a disease that affects the heart or blood vessels.  Smoking.  Having diabetes.  Being female.  Being obese.  Not being active.  Having depression. What are the signs or symptoms? Symptoms can vary a lot from one person to another. Symptoms may be mild or severe depending on the amount of damage and which parts of the brain have been affected. Symptoms may begin suddenly or may develop gradually. Symptoms may remain stable, or they may get worse over time. Symptoms of vascular dementia may be similar to those of Alzheimer disease. The two conditions can occur together (mixed dementia). Symptoms of vascular dementia may include: Mental  Confusion.  Memory problems.  Poor attention and concentration.  Trouble understanding speech.  Depression.  Personality  changes.  Trouble recognizing familiar people.  Agitation or aggression.  Paranoia.  Delusions or hallucinations. Physical  Weakness.  Poor balance.  Loss of bladder or bowel control (incontinence).  Unsteady walking (gait).  Speaking problems. Behavioral  Getting lost in familiar places.  Problems with planning and judgment.  Trouble following instructions.  Social problems.  Emotional outbursts.  Trouble with daily activities and self-care.  Problems handling money. How is this diagnosed? There is not a specific test to diagnose vascular dementia. The health care provider will consider the person's medical history and symptoms or changes that are reported by friends and family. The health care provider will do a physical exam and may order lab tests or other tests that check brain and nervous system function. Tests that may be done include:  Blood tests.  Brain imaging tests.  Tests of movement, speech, and other daily activities (neurological exam).  Tests of memory, thinking, and problem-solving (neuropsychological or neurocognitive testing).  Diagnosis may involve several specialists. These may include a health care provider who specializes in the brain and nervous system (neurologist), a provider who specializes in disorders of the mind (psychiatrist), and a provider who focuses on speech and language changes (Electrical engineer). How is this treated? There is no cure for vascular dementia. Brain damage that has already occurred cannot be reversed. Treatment depends on:  How severe the condition is.  Which parts of the brain have been affected.  The person's overall health.  Treatment measures aim to:  Treat the underlying cause of vascular dementia and manage risk factors. This may include: ? Controlling blood pressure. ? Lowering cholesterol. ? Treating diabetes. ? Quitting smoking. ? Losing weight.  Manage symptoms.  Prevent further brain  damage.  Improve the person's health and quality of life.  Treatment for dementia may involve a team of health care providers, including:  A neurologist.  A psychiatrist.  An occupational therapist.  A speech pathologist.  A cardiologist.  An exercise physiologist or physical therapist.  Follow these instructions at home: Home care for a person with vascular dementia depends on what caused the condition and how severe the symptoms are. General guidelines for care  at home include:  Following the health care provider's instructions for treating the condition that caused the dementia.  Using medicines only as told by the person's health care provider.  Creating a safe living space.  Learning ways to help the person remember people, appointments, and daily activities.  Finding a support group to help caregivers and family to cope with the effects of dementia.  Helping family and friends learn about ways to communicate with a person who has dementia.  Making sure the person keeps all follow-up visits and goes to all rehabilitation appointments as told by the health care team. This is important.  Contact a health care provider if:  A fever develops.  New behavioral problems develop.  Problems with swallowing develop.  Confusion gets worse.  Sleepiness gets worse. Get help right away if:  Loss of consciousness occurs.  There is a sudden loss of speech, balance, or thinking ability.  New numbness or paralysis occurs.  Sudden, severe headache occurs.  Vision is lost or suddenly gets worse in one or both eyes. This information is not intended to replace advice given to you by your health care provider. Make sure you discuss any questions you have with your health care provider. Document Released: 03/18/2002 Document Revised: 09/03/2015 Document Reviewed: 07/09/2014 Elsevier Interactive Patient Education  2018 [employer].

## 2016-11-25 ENCOUNTER — Other Ambulatory Visit: Payer: Self-pay | Admitting: Internal Medicine

## 2016-11-25 ENCOUNTER — Other Ambulatory Visit: Payer: Self-pay | Admitting: Physician Assistant

## 2016-11-25 LAB — CBC WITH DIFFERENTIAL/PLATELET
Basophils Absolute: 94 cells/uL (ref 0–200)
Basophils Relative: 1.4 %
EOS PCT: 2.4 %
Eosinophils Absolute: 161 cells/uL (ref 15–500)
HEMATOCRIT: 33.6 % — AB (ref 35.0–45.0)
HEMOGLOBIN: 11.2 g/dL — AB (ref 11.7–15.5)
LYMPHS ABS: 1541 {cells}/uL (ref 850–3900)
MCH: 29.6 pg (ref 27.0–33.0)
MCHC: 33.3 g/dL (ref 32.0–36.0)
MCV: 88.9 fL (ref 80.0–100.0)
MPV: 11.3 fL (ref 7.5–12.5)
Monocytes Relative: 9.5 %
NEUTROS ABS: 4268 {cells}/uL (ref 1500–7800)
Neutrophils Relative %: 63.7 %
Platelets: 309 10*3/uL (ref 140–400)
RBC: 3.78 10*6/uL — AB (ref 3.80–5.10)
RDW: 13.2 % (ref 11.0–15.0)
Total Lymphocyte: 23 %
WBC mixed population: 637 cells/uL (ref 200–950)
WBC: 6.7 10*3/uL (ref 3.8–10.8)

## 2016-11-25 LAB — URINE CULTURE
MICRO NUMBER: 80889102
RESULT: NO GROWTH
SPECIMEN QUALITY:: ADEQUATE

## 2016-11-25 LAB — LIPID PANEL
CHOL/HDL RATIO: 3.3 (calc) (ref <5.0)
Cholesterol: 169 mg/dL (ref <200)
HDL: 52 mg/dL (ref >50)
LDL CHOLESTEROL (CALC): 90 mg/dL
NON-HDL CHOLESTEROL (CALC): 117 mg/dL (ref <130)
Triglycerides: 163 mg/dL — ABNORMAL HIGH (ref <150)

## 2016-11-25 LAB — HEMOGLOBIN A1C
EAG (MMOL/L): 6.6 (calc)
Hgb A1c MFr Bld: 5.8 % of total Hgb — ABNORMAL HIGH (ref <5.7)
MEAN PLASMA GLUCOSE: 120 (calc)

## 2016-11-25 LAB — URINALYSIS, ROUTINE W REFLEX MICROSCOPIC
BILIRUBIN URINE: NEGATIVE
GLUCOSE, UA: NEGATIVE
Hgb urine dipstick: NEGATIVE
NITRITE: POSITIVE — AB
SPECIFIC GRAVITY, URINE: 1.03 (ref 1.001–1.03)
pH: <=5 (ref 5.0–8.0)

## 2016-11-25 LAB — BASIC METABOLIC PANEL WITH GFR
BUN / CREAT RATIO: 13 (calc) (ref 6–22)
BUN: 13 mg/dL (ref 7–25)
CO2: 25 mmol/L (ref 20–32)
Calcium: 9.5 mg/dL (ref 8.6–10.4)
Chloride: 102 mmol/L (ref 98–110)
Creat: 1.01 mg/dL — ABNORMAL HIGH (ref 0.60–0.93)
GFR, EST AFRICAN AMERICAN: 65 mL/min/{1.73_m2} (ref >=60)
GFR, Est Non African American: 56 mL/min/{1.73_m2} — ABNORMAL LOW (ref >=60)
GLUCOSE: 94 mg/dL (ref 65–99)
Potassium: 3.8 mmol/L (ref 3.5–5.3)
Sodium: 140 mmol/L (ref 135–146)

## 2016-11-25 LAB — HEPATIC FUNCTION PANEL
AG RATIO: 1.6 (calc) (ref 1.0–2.5)
ALT: 13 U/L (ref 6–29)
AST: 17 U/L (ref 10–35)
Albumin: 4.3 g/dL (ref 3.6–5.1)
Alkaline phosphatase (APISO): 128 U/L (ref 33–130)
BILIRUBIN DIRECT: 0.1 mg/dL (ref 0.0–0.2)
BILIRUBIN TOTAL: 0.5 mg/dL (ref 0.2–1.2)
GLOBULIN: 2.7 g/dL (ref 1.9–3.7)
Indirect Bilirubin: 0.4 mg/dL (calc) (ref 0.2–1.2)
Total Protein: 7 g/dL (ref 6.1–8.1)

## 2016-11-25 LAB — VITAMIN B12: VITAMIN B 12: 328 pg/mL (ref 200–1100)

## 2016-11-25 LAB — IRON, TOTAL/TOTAL IRON BINDING CAP
%SAT: 18 % (calc) (ref 11–50)
IRON: 58 ug/dL (ref 45–160)
TIBC: 321 mcg/dL (calc) (ref 250–450)

## 2016-11-25 LAB — MAGNESIUM: Magnesium: 1.8 mg/dL (ref 1.5–2.5)

## 2016-11-25 LAB — MICROALBUMIN / CREATININE URINE RATIO
Creatinine, Urine: 620 mg/dL — ABNORMAL HIGH (ref 20–320)
Microalb Creat Ratio: 6 mcg/mg creat (ref <30)
Microalb, Ur: 4 mg/dL

## 2016-11-25 LAB — TSH: TSH: 1.26 m[IU]/L (ref 0.40–4.50)

## 2016-11-25 MED ORDER — CIPROFLOXACIN HCL 500 MG PO TABS: 500.0000 mg | ORAL_TABLET | Freq: Two times a day (BID) | ORAL | 0 refills | Status: DC

## 2016-11-29 ENCOUNTER — Encounter: Payer: Self-pay | Admitting: Cardiology

## 2016-11-29 ENCOUNTER — Ambulatory Visit (INDEPENDENT_AMBULATORY_CARE_PROVIDER_SITE_OTHER): Payer: Medicare Other | Admitting: Cardiology

## 2016-11-29 VITALS — BP 122/70 | HR 75 | Ht 65.0 in | Wt 225.0 lb

## 2016-11-29 DIAGNOSIS — E669 Obesity, unspecified: Secondary | ICD-10-CM

## 2016-11-29 DIAGNOSIS — I428 Other cardiomyopathies: Secondary | ICD-10-CM

## 2016-11-29 DIAGNOSIS — I10 Essential (primary) hypertension: Secondary | ICD-10-CM | POA: Diagnosis not present

## 2016-11-29 DIAGNOSIS — I480 Paroxysmal atrial fibrillation: Secondary | ICD-10-CM

## 2016-11-29 DIAGNOSIS — I5042 Chronic combined systolic (congestive) and diastolic (congestive) heart failure: Secondary | ICD-10-CM | POA: Diagnosis not present

## 2016-11-29 MED ORDER — FUROSEMIDE 20 MG PO TABS: ORAL_TABLET | ORAL | 3 refills | Status: DC

## 2016-11-29 NOTE — Patient Instructions (Signed)
MEDICATION CHANGES-- FUROSEMIDE TAKE 20 MG TWICE A DAY AND MAY TAKE AN EXTRA TABLET DAILY IF NEEDED     Your physician wants you to follow-up in [place]. You will receive a reminder letter in the mail two months in advance. If you don't receive a letter, please call our office to schedule the follow-up appointment.  If you need a refill on your cardiac medications before your next appointment, please call your pharmacy.

## 2016-11-29 NOTE — Progress Notes (Signed)
PCP: Unk Pinto, MD  Clinic Note: Chief Complaint  Patient presents with  . Follow-up    Afib.   . Cardiomyopathy    partially recoverred NICM.    HPI: [name] [name] is a 71 y.o. female with a PMH below who presents today for six-month follow-up with a history nonischemic cardiomyopathy.  She has long history of intermittent left bundle branch block. Nonobstructive CAD. Angiographic normal coronary arteries in May 2017. She has had a history of A. Fib, but not on anticoagulation because of anemia.  In the interim since I last saw her, she's been having some fluttering sensations in her chest lasting less than a few seconds but was associated with nausea.  [name] [name] was last seen on 05/25/2016: She was doing very well with still having some fluttering sensations. Occasional left-sided discomfort over the left breast that was usually sharp no recurrent rapid irregular heartbeats or palpitations. She did note having shortness of breath if she rushes upstairs.  Recent Hospitalizations:   Admitted for severe left knee osteoarthritis pain in June 2018 - had total knee arthroscopy last  Studies Personally Reviewed - (if available, images/films reviewed: From Epic Chart or Care Everywhere)  None  Interval History: September comes in today not really sure what she is supposed to make of her palpitation symptoms. She still gets the occasional flutters they last less than a minute they may happen every other day, but are often associated with a little sensation of nausea and dyspnea. Once there is on she feels fine. She seems to think that this fluttering off and on for about a minute or so, but nothing prolonged and nothing that seems like a real rhythm or arrhythmia. She doesn't really get dizzy with it. His back getting active is recovering from her knee surgery now - still doing rehabilitation activities. Will notice occasionally if she goes upstairs or walks briskly that she  may get short of breath, but really has not had any significant PND orthopnea or edema. No real chest tightness or pressure rest or exertion.   No  dizziness, weakness or syncope/near syncope. No TIA/amaurosis fugax symptoms. No melena, hematochezia, hematuria, or epstaxis. No claudication.  ROS: A comprehensive was performed. Review of Systems  Constitutional: Negative for malaise/fatigue.  HENT: Negative for congestion and nosebleeds.   Respiratory: Negative for cough and wheezing.        Per history of present illness  Cardiovascular:       Per history of present illness  Gastrointestinal: Negative for heartburn and nausea.  Musculoskeletal: Positive for joint pain (Recovering from left knee surgery.).  Psychiatric/Behavioral: Negative for memory loss. The patient is not nervous/anxious and does not have insomnia.   All other systems reviewed and are negative.  I have reviewed and (if needed) personally updated the patient's problem list, medications, allergies, past medical and surgical history, social and family history.   Past Medical History:  Diagnosis Date  . Allergy    Nasonex daily as needed  . Anemia   . Anxiety   . Arthritis   . Breast cancer ([place])     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  . Chronic back pain    cyst sitting on L4-5;slipped disc  . Depression    takes Cymbalta daily  . Diverticulosis   . Dysrhythmia   . GERD (gastroesophageal reflux disease)    takes Omeprazole daily  . Headache(784.0)    rare  . History of gastric ulcer  at age 78  . History of shingles   . Hyperlipidemia    takes Atorvastatin 3 times a week  . Hypertension    takes Cardura nightly and Verapamil daily  . Hypothyroidism   . Insomnia    takes Restoril nightly  . Joint pain   . Nonischemic cardiomyopathy ([place]) 07/2015   Normal coronary arteries by cath. EF was 25-30% by echo. GR2 DD - likely related to LBBB in setting of A. fib  . Paroxysmal  atrial fibrillation ([place]): CHA2DS2-VASc Score - starting Eliquis 08/06/2015   This patients CHA2DS2-VASc Score and unadjusted Ischemic Stroke Rate (% per year) is equal to 4.8 % stroke rate/year from a score of 4  Above score calculated as 1 point each if present [CHF, HTN, DM, Vascular=MI/PAD/Aortic Plaque, Age if 65-74, or Female]; 2 points each if present [Age > 75, or Stroke/TIA/TE]  . PONV (postoperative nausea and vomiting)   . S/P radiation therapy 07/18/2013-08/28/2013   1) Left Breast / 50 Gy in 25 fractions/ 2) Left Breast Boost / 10 Gy in 5 fractions  . Shingles   . Sinus drainage    put on Levaquin yesterday if no better in 3 days will start prednisone  . Thyroid disease   . Weakness    tingling and numbness both hands and left leg  . Wears glasses     Past Surgical History:  Procedure Laterality Date  . ABDOMINAL HYSTERECTOMY    . BREAST LUMPECTOMY Left    neg  . CARDIAC CATHETERIZATION N/A 08/10/2015   Procedure: Right/Left Heart Cath and Coronary Angiography;  Surgeon: Burnell Blanks, MD;  Location: [facility];  Service: Cardiovascular;: Nonobstructive CAD  . CATARACT EXTRACTION BILATERAL W/ ANTERIOR VITRECTOMY Bilateral   . CHOLECYSTECTOMY    . COLONOSCOPY  2014  . DUPUYTREN / PALMAR FASCIOTOMY Bilateral 2007   x 3 to left and once to the right   . epidural injections     x 3  . FOOT TENDON SURGERY Right ~ 11/2015   "stepped in a hole and tore 2 tendons"  . JOINT REPLACEMENT    . KNEE ARTHROSCOPY Bilateral   . LUMBAR FUSION  10/2013   Dr. Lynann Bologna  . TONSILLECTOMY    . TOTAL KNEE ARTHROPLASTY Left 09/23/2016  . TOTAL KNEE ARTHROPLASTY Left 09/23/2016   Procedure: TOTAL KNEE ARTHROPLASTY;  Surgeon: Dorna Leitz, MD;  Location: [facility];  Service: Orthopedics;  Laterality: Left;  . TRANSTHORACIC ECHOCARDIOGRAM  08/07/2015   Severely reduced LVEF at 25-30% with diffuse HK. GR 2 DD. Ventricular dyssynergy secondary to LBBB. Small to moderate pericardial  effusion but no hemodynamic compromise  . TRANSTHORACIC ECHOCARDIOGRAM  10/2015    (EF up from 25-30%): - Left ventricle: Poor acoustic windows -  difficult to see.    EF estimated at 45-50% with inferoseptal and possible posterior hypokinesis. GR 1 DD. No pericardial effusion.  . TUBAL LIGATION    . UPPER GI ENDOSCOPY      Current Meds  Medication Sig  . anastrozole (ARIMIDEX) 1 MG tablet Take 1 tablet (1 mg total) by mouth daily.  Marland Kitchen apixaban (ELIQUIS) 5 MG TABS tablet Take 1 tablet (5 mg total) by mouth 2 (two) times daily.  Marland Kitchen atorvastatin (LIPITOR) 80 MG tablet Take 0.5 tablets (40 mg total) by mouth daily.  Marland Kitchen buPROPion (WELLBUTRIN XL) 150 MG 24 hr tablet TAKE 1 TABLET (150 MG TOTAL) BY MOUTH EVERY MORNING.  . carvedilol (COREG) 25 MG tablet TAKE 1 TABLET  2 TIMES DAILY OR AS DIRECTED.  Marland Kitchen Cholecalciferol (VITAMIN D) 2000 UNITS tablet Take 4,000 Units by mouth daily.  . ciprofloxacin (CIPRO) 500 MG tablet Take 1 tablet (500 mg total) by mouth 2 (two) times daily.  . cyclobenzaprine (FLEXERIL) 10 MG tablet Take 10 mg by mouth at bedtime as needed (FOR RESTLESS LEGS).   Marland Kitchen docusate sodium (COLACE) 100 MG capsule Take 1 capsule (100 mg total) by mouth 2 (two) times daily.  . DULoxetine (CYMBALTA) 30 MG capsule Take 1 capsule (30 mg total) by mouth 3 (three) times daily. (Patient taking differently: Take 90 mg by mouth every morning. )  . ferrous sulfate 325 (65 FE) MG tablet TAKE 1 TABLET (325 MG TOTAL) BY MOUTH 2 (TWO) TIMES DAILY WITH A MEAL.  Marland Kitchen gabapentin (NEURONTIN) 300 MG capsule TAKE ONE CAPSULE BY MOUTH EVERY DAY AT BEDTIME  . ipratropium (ATROVENT) 0.03 % nasal spray Place 2 sprays into the nose 3 (three) times daily.  Marland Kitchen levothyroxine (SYNTHROID, LEVOTHROID) 50 MCG tablet TAKE 1/2 TO 1 TABLET ON AN EMPTY STOMACH EVERY MORNING 30 TO 60 MIN BEFORE BREAKFAST  . meloxicam (MOBIC) 15 MG tablet TAKE 1/2 TO 1 TABLET DAILY WITH FOOD FOR PAIN *DONT TAKE WITH ALEVE OR IBUPROFEN CAN TAKE TYLENOL*    . mometasone (NASONEX) 50 MCG/ACT nasal spray USE 1 TO 2 SPRAYS IN EACH NOSTRIL TWICE DAILY  . ranitidine (ZANTAC) 300 MG tablet TAKE 1 TO 2 TABLETS DAILY AS NEEDED FOR ACID INDIGESTION & REFLUX (Patient taking differently: TAKE 1 TWICE DAILY)  . temazepam (RESTORIL) 30 MG capsule TAKE ONE CAPSULE AT BEDTIME AS NEEDED FOR SLEEP (Patient taking differently: TAKE ONE CAPSULE AT BEDTIME)  . traMADol (ULTRAM) 50 MG tablet Take 1 tablet (50 mg total) by mouth every 12 (twelve) hours as needed.  . valsartan (DIOVAN) 80 MG tablet Take 80 mg by mouth daily.   . [DISCONTINUED] furosemide (LASIX) 20 MG tablet TAKE 1 TABLET BY MOUTH TWICE A DAY    Allergies  Allergen Reactions  . Requip [Ropinirole Hcl] Shortness Of Breath and Nausea And Vomiting  . Minocycline Hives  . Tetracyclines & Related Hives  . Zestril [Lisinopril] Cough  . Zetia [Ezetimibe] Other (See Comments)    MYALGIA JOINT PAIN  . Amoxicillin Rash     Has patient had a PCN reaction causing immediate rash, facial/tongue/throat swelling, SOB or lightheadedness with hypotension:No Has patient had a PCN reaction causing severe rash involving mucus membranes or skin necrosis: No Has patient had a PCN reaction that required hospitalization: No Has patient had a PCN reaction occurring within the last 10 years: No If all of the above answers are "NO", then may proceed with Cephalosporin use.   . Codeine Other (See Comments)    fatigue  . Morphine Sulfate Other (See Comments)    Palpitations  . Sulfa Antibiotics Other (See Comments)    Headache (pt states that a blood pressure medicine caused a severe headache but doesn't remember a reaction to sulfa)    Social History   Social History  . Marital status: Widowed    Spouse name: N/A  . Number of children: N/A  . Years of education: N/A   Social History Main Topics  . Smoking status: Never Smoker  . Smokeless tobacco: Never Used  . Alcohol use Yes     Comment: wine  occasionally-rarely  . Drug use: No  . Sexual activity: Not Asked   Other Topics Concern  . None   Social  History Narrative  . None    family history includes COPD in her father; Heart attack in her mother; Hypertension in her father; Stroke in her mother.  Wt Readings from Last 3 Encounters:  11/29/16 225 lb (102.1 kg)  11/24/16 222 lb (100.7 kg)  09/23/16 226 lb 9.6 oz (102.8 kg)    PHYSICAL EXAM BP 122/70   Pulse 75   Ht 5\' 5"  (1.651 m)   Wt 225 lb (102.1 kg)   BMI 37.44 kg/m  Physical Exam   Adult ECG Report  Rate: 75 ;  Rhythm: normal sinus rhythm and LVH with repolarization abnormality in the borderline left bundle branch pattern mostly IVCD.;   Narrative Interpretation: Not as clear-cut LBBB   Other studies Reviewed: Additional studies/ records that were reviewed today include:  Recent Labs:   Lab Results  Component Value Date   CHOL 169 11/24/2016   HDL 52 11/24/2016   LDLCALC 82 08/10/2016   TRIG 163 (H) 11/24/2016   CHOLHDL 3.3 11/24/2016   Lab Results  Component Value Date   CREATININE 1.01 (H) 11/24/2016   BUN 13 11/24/2016   NA 140 11/24/2016   K 3.8 11/24/2016   CL 102 11/24/2016   CO2 25 11/24/2016    ASSESSMENT / PLAN: Problem List Items Addressed This Visit    Chronic combined systolic and diastolic heart failure, NYHA class 2 (HCC) -- exacerbated by A. fib (Chronic)   Relevant Medications   furosemide (LASIX) 20 MG tablet   Other Relevant Orders   EKG 12-Lead (Completed)   Essential hypertension (Chronic)    Controlled on current meds.      Relevant Medications   furosemide (LASIX) 20 MG tablet   Non-ischemic cardiomyopathy (HCC) (Chronic)    Notable improvement in her overall EF by echo. Probably related to A. fib RVR and left bundle-branch block. Now her EKG doesn't look like full left bundle-branch block. She seems to be relatively euvolemic, but I did mention that she can adjust her Lasix placed on sliding scale and symptoms.  We will provide a curfew extra doses if necessary. She is on a good dose of carvedilol and ARB which we may want to consider switching to a different therapy.      Relevant Medications   furosemide (LASIX) 20 MG tablet   Obesity (BMI 30-39.9) (Chronic)    The patient understands the need to lose weight with diet and exercise. We have discussed specific strategies for this.      Paroxysmal atrial fibrillation ([place]): CHA2DS2-VASc Score - starting Eliquis - Primary (Chronic)    Seems like she is stable without any recurrent true A. fib. She does feel occasional flutters and may be having some occult episodes, but doesn't seem to be that significant. She is doing fine [name] for anticoagulation and carvedilol for rate control. Not on any rhythm control agent. She is on ARB (will likely need to change to a different ARB).      Relevant Medications   furosemide (LASIX) 20 MG tablet   Other Relevant Orders   EKG 12-Lead (Completed)      Current medicines are reviewed at length with the patient today. (+/- concerns) n/a The following changes have been made: n/a  Patient Instructions  MEDICATION CHANGES-- FUROSEMIDE TAKE 20 MG TWICE A DAY AND MAY TAKE AN EXTRA TABLET DAILY IF NEEDED     Your physician wants you to follow-up in [place] DR Cordelle Dahmen. You will receive a reminder letter in  the mail two months in advance. If you don't receive a letter, please call our office to schedule the follow-up appointment.  If you need a refill on your cardiac medications before your next appointment, please call your pharmacy.    Studies Ordered:   Orders Placed This Encounter  Procedures  . EKG 12-Lead      Glenetta Hew, M.D., M.S. Interventional Cardiologist   Pager # [phone] Phone # [phone] [address] [place], [place] [address]

## 2016-12-02 ENCOUNTER — Encounter: Payer: Self-pay | Admitting: Cardiology

## 2016-12-02 NOTE — Assessment & Plan Note (Signed)
Controlled on current meds.

## 2016-12-02 NOTE — Assessment & Plan Note (Signed)
Seems like she is stable without any recurrent true A. fib. She does feel occasional flutters and may be having some occult episodes, but doesn't seem to be that significant. She is doing fine [name] for anticoagulation and carvedilol for rate control. Not on any rhythm control agent. She is on ARB (will likely need to change to a different ARB).

## 2016-12-02 NOTE — Assessment & Plan Note (Signed)
Notable improvement in her overall EF by echo. Probably related to A. fib RVR and left bundle-branch block. Now her EKG doesn't look like full left bundle-branch block. She seems to be relatively euvolemic, but I did mention that she can adjust her Lasix placed on sliding scale and symptoms. We will provide a curfew extra doses if necessary. She is on a good dose of carvedilol and ARB which we may want to consider switching to a different therapy.

## 2016-12-02 NOTE — Assessment & Plan Note (Signed)
The patient understands the need to lose weight with diet and exercise. We have discussed specific strategies for this.  

## 2016-12-27 ENCOUNTER — Other Ambulatory Visit: Payer: Self-pay | Admitting: Cardiology

## 2016-12-27 ENCOUNTER — Other Ambulatory Visit: Payer: Self-pay | Admitting: Internal Medicine

## 2016-12-27 NOTE — Telephone Encounter (Signed)
Rx(s) sent to pharmacy electronically.  

## 2017-01-02 ENCOUNTER — Other Ambulatory Visit: Payer: Self-pay | Admitting: Internal Medicine

## 2017-01-03 ENCOUNTER — Other Ambulatory Visit: Payer: Self-pay | Admitting: Internal Medicine

## 2017-01-30 ENCOUNTER — Other Ambulatory Visit: Payer: Self-pay | Admitting: Internal Medicine

## 2017-02-22 ENCOUNTER — Ambulatory Visit: Payer: Medicare Other | Admitting: Adult Health

## 2017-02-22 ENCOUNTER — Encounter: Payer: Self-pay | Admitting: Adult Health

## 2017-02-22 VITALS — BP 116/64 | HR 71 | Temp 96.8°F | Ht 65.0 in | Wt 222.2 lb

## 2017-02-22 DIAGNOSIS — R42 Dizziness and giddiness: Secondary | ICD-10-CM | POA: Diagnosis not present

## 2017-02-22 DIAGNOSIS — H6501 Acute serous otitis media, right ear: Secondary | ICD-10-CM | POA: Diagnosis not present

## 2017-02-22 MED ORDER — PREDNISONE 20 MG PO TABS: ORAL_TABLET | ORAL | 0 refills | Status: DC

## 2017-02-22 MED ORDER — MECLIZINE HCL 32 MG PO TABS: 32.0000 mg | ORAL_TABLET | Freq: Three times a day (TID) | ORAL | 0 refills | Status: DC | PRN

## 2017-02-22 NOTE — Progress Notes (Signed)
Assessment and Plan:  [name] [name] was seen today for dizziness and oral swelling.  Diagnoses and all orders for this visit:  Right acute serous otitis media, recurrence not specified -     Continue flonase daily; may take several weeks for symptoms to resolve -     predniSONE (DELTASONE) 20 MG tablet; 2 tablets daily for 2 days, 1 tablet daily for 4 days.  Vertigo -     meclizine (ANTIVERT) 32 MG tablet; Take 1 tablet (32 mg total) 3 (three) times daily as needed by mouth. Fill this prescription only if you continue to experience dizziness.   Go to the ER if any chest pain, shortness of breath, nausea, dizziness, severe HA, changes vision/speech  Further disposition pending results of labs. Discussed med's effects and SE's.   Over 15 minutes of exam, counseling, chart review, and critical decision making was performed.   Future Appointments  Date Time Provider [place]  03/06/2017  3:30 PM Unk Pinto, MD [facility] None  07/13/2017  1:00 PM [facility] None  07/13/2017  1:30 PM Magrinat, Virgie Dad, MD [facility] None  11/30/2017 10:00 AM Vicie Mutters, PA-C [facility] None    ------------------------------------------------------------------------------------------------------------------   HPI BP 116/64   Pulse 71   Temp (!) 96.8 F (36 C)   Ht 5\' 5"  (1.651 m)   Wt 222 lb 3.2 oz (100.8 kg)   SpO2 95%   BMI 36.98 kg/m   71 y.o.female presents for full sensation in head and ears for 2-3 days, pressure worse on R, and became briefly dizzy this morning at work. She denies falls, no changes in vision, HA, - does endorse facial pressure. She does endorse a minor "throat tickle" - denies cough, SOB, CP, palpitations, neck/extremity pain, n/v/d/abdominal pain.   She has been using flonase daily for several years.    Past Medical History:  Diagnosis Date  . Allergy    Nasonex daily as needed  . Anemia   . Anxiety   . Arthritis   . Breast cancer  ([place])     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  . Chronic back pain    cyst sitting on L4-5;slipped disc  . Depression    takes Cymbalta daily  . Diverticulosis   . Dysrhythmia   . GERD (gastroesophageal reflux disease)    takes Omeprazole daily  . Headache(784.0)    rare  . History of gastric ulcer at age 25  . History of shingles   . Hyperlipidemia    takes Atorvastatin 3 times a week  . Hypertension    takes Cardura nightly and Verapamil daily  . Hypothyroidism   . Insomnia    takes Restoril nightly  . Joint pain   . Nonischemic cardiomyopathy ([place]) 07/2015   Normal coronary arteries by cath. EF was 25-30% by echo. GR2 DD - likely related to LBBB in setting of A. fib  . Paroxysmal atrial fibrillation ([place]): CHA2DS2-VASc Score - starting Eliquis 08/06/2015   This patients CHA2DS2-VASc Score and unadjusted Ischemic Stroke Rate (% per year) is equal to 4.8 % stroke rate/year from a score of 4  Above score calculated as 1 point each if present [CHF, HTN, DM, Vascular=MI/PAD/Aortic Plaque, Age if 65-74, or Female]; 2 points each if present [Age > 75, or Stroke/TIA/TE]  . PONV (postoperative nausea and vomiting)   . S/P radiation therapy 07/18/2013-08/28/2013   1) Left Breast / 50 Gy in 25 fractions/ 2) Left Breast Boost / 10 Gy  in 5 fractions  . Shingles   . Sinus drainage    put on Levaquin yesterday if no better in 3 days will start prednisone  . Thyroid disease   . Weakness    tingling and numbness both hands and left leg  . Wears glasses      Allergies  Allergen Reactions  . Requip [Ropinirole Hcl] Shortness Of Breath and Nausea And Vomiting  . Minocycline Hives  . Tetracyclines & Related Hives  . Zestril [Lisinopril] Cough  . Zetia [Ezetimibe] Other (See Comments)    MYALGIA JOINT PAIN  . Amoxicillin Rash     Has patient had a PCN reaction causing immediate rash, facial/tongue/throat swelling, SOB or lightheadedness with hypotension:No Has  patient had a PCN reaction causing severe rash involving mucus membranes or skin necrosis: No Has patient had a PCN reaction that required hospitalization: No Has patient had a PCN reaction occurring within the last 10 years: No If all of the above answers are "NO", then may proceed with Cephalosporin use.   . Codeine Other (See Comments)    fatigue  . Morphine Sulfate Other (See Comments)    Palpitations  . Sulfa Antibiotics Other (See Comments)    Headache (pt states that a blood pressure medicine caused a severe headache but doesn't remember a reaction to sulfa)    Current Outpatient Medications on File Prior to Visit  Medication Sig  . anastrozole (ARIMIDEX) 1 MG tablet Take 1 tablet (1 mg total) by mouth daily.  Marland Kitchen apixaban (ELIQUIS) 5 MG TABS tablet Take 1 tablet (5 mg total) by mouth 2 (two) times daily.  Marland Kitchen atorvastatin (LIPITOR) 80 MG tablet Take 0.5 tablets (40 mg total) by mouth daily.  Marland Kitchen buPROPion (WELLBUTRIN XL) 150 MG 24 hr tablet TAKE 1 TABLET (150 MG TOTAL) BY MOUTH EVERY MORNING.  . carvedilol (COREG) 25 MG tablet TAKE 1 TABLET 2 TIMES DAILY OR AS DIRECTED.  Marland Kitchen Cholecalciferol (VITAMIN D) 2000 UNITS tablet Take 4,000 Units by mouth daily.  . ciprofloxacin (CIPRO) 500 MG tablet Take 1 tablet (500 mg total) by mouth 2 (two) times daily.  . cyclobenzaprine (FLEXERIL) 10 MG tablet Take 10 mg by mouth at bedtime as needed (FOR RESTLESS LEGS).   Marland Kitchen docusate sodium (COLACE) 100 MG capsule Take 1 capsule (100 mg total) by mouth 2 (two) times daily.  . DULoxetine (CYMBALTA) 30 MG capsule Take 1 capsule (30 mg total) by mouth 3 (three) times daily. (Patient taking differently: Take 90 mg by mouth every morning. )  . DULoxetine (CYMBALTA) 30 MG capsule TAKE 1 CAPSULE BY MOUTH 3 TIMES DAILY  . ferrous sulfate 325 (65 FE) MG tablet TAKE 1 TABLET (325 MG TOTAL) BY MOUTH 2 (TWO) TIMES DAILY WITH A MEAL.  . furosemide (LASIX) 20 MG tablet TAKE  20 MG BY MOUTH TWICE A DAY AND MAY TAKE AN EXTRA  TABLET DAILY IF NEEDED.  Marland Kitchen gabapentin (NEURONTIN) 300 MG capsule TAKE ONE CAPSULE BY MOUTH EVERY DAY AT BEDTIME  . ipratropium (ATROVENT) 0.03 % nasal spray Place 2 sprays into the nose 3 (three) times daily.  Marland Kitchen levothyroxine (SYNTHROID, LEVOTHROID) 50 MCG tablet TAKE 1/2 TO 1 TABLET ON AN EMPTY STOMACH EVERY MORNING 30 TO 60 MIN BEFORE BREAKFAST  . meloxicam (MOBIC) 15 MG tablet TAKE 1/2 TO 1 TABLET DAILY WITH FOOD FOR PAIN *DONT TAKE WITH ALEVE OR IBUPROFEN CAN TAKE TYLENOL*  . mometasone (NASONEX) 50 MCG/ACT nasal spray USE 1 TO 2 SPRAYS IN EACH NOSTRIL TWICE  DAILY  . ranitidine (ZANTAC) 300 MG tablet TAKE 1 TO 2 TABLETS DAILY AS NEEDED FOR ACID INDIGESTION & REFLUX  . temazepam (RESTORIL) 30 MG capsule TAKE ONE CAPSULE AT BEDTIME AS NEEDED FOR SLEEP (Patient taking differently: TAKE ONE CAPSULE AT BEDTIME)  . traMADol (ULTRAM) 50 MG tablet Take 1 tablet (50 mg total) by mouth every 12 (twelve) hours as needed.  . valsartan (DIOVAN) 80 MG tablet TAKE 1 TABLET BY MOUTH EVERY DAY   No current facility-administered medications on file prior to visit.     ROS: all negative except above.   Physical Exam:  BP 116/64   Pulse 71   Temp (!) 96.8 F (36 C)   Ht 5\' 5"  (1.651 m)   Wt 222 lb 3.2 oz (100.8 kg)   SpO2 95%   BMI 36.98 kg/m   General Appearance: Well nourished, in no apparent distress. Eyes: PERRLA, EOMs, conjunctiva no swelling or erythema Sinuses: No Frontal/maxillary tenderness ENT/Mouth: Ext aud canals clear, TMs without erythema- right TM demonstrates serous otitis. No erythema, swelling, or exudate on post pharynx.  Tonsils not swollen or erythematous. Hearing normal.  Neck: Supple, thyroid normal.  Respiratory: Respiratory effort normal, BS equal bilaterally without rales, rhonchi, wheezing or stridor.  Cardio: RRR with no MRGs. Brisk peripheral pulses without edema.  Abdomen: Soft, + BS.  Non tender, no guarding, rebound, hernias, masses. Lymphatics: Non tender without  lymphadenopathy.  Musculoskeletal: normal gait.  Skin: Warm, dry without rashes, lesions, ecchymosis.  Neuro: Normal muscle tone, no cerebellar symptoms.  Psych: Awake and oriented X 3, normal affect, Insight and Judgment appropriate.     Izora Ribas, NP 2:28 PM [facility]

## 2017-02-22 NOTE — Patient Instructions (Addendum)
Wait to fill antivert/meclizine - only if you continue to have issues with dizziness  Continue with flonase daily  Start prednisone - may stop if symptoms resolve.    Otitis Media, Adult Otitis media occurs when there is inflammation and fluid in the middle ear. Your middle ear is a part of the ear that contains bones for hearing as well as air that helps send sounds to your brain. What are the causes? This condition is caused by a blockage in the eustachian tube. This tube drains fluid from the ear to the back of the nose (nasopharynx). A blockage in this tube can be caused by an object or by swelling (edema) in the tube. Problems that can cause a blockage include:  A cold or other upper respiratory infection.  Allergies.  An irritant, such as tobacco smoke.  Enlarged adenoids. The adenoids are areas of soft tissue located high in the back of the throat, behind the nose and the roof of the mouth.  A mass in the nasopharynx.  Damage to the ear caused by pressure changes (barotrauma).  What are the signs or symptoms? Symptoms of this condition include:  Ear pain.  A fever.  Decreased hearing.  A headache.  Tiredness (lethargy).  Fluid leaking from the ear.  Ringing in the ear.  How is this diagnosed? This condition is diagnosed with a physical exam. During the exam your health care provider will use an instrument called an otoscope to look into your ear and check for redness, swelling, and fluid. He or she will also ask about your symptoms. Your health care provider may also order tests, such as:  A test to check the movement of the eardrum (pneumatic otoscopy). This test is done by squeezing a small amount of air into the ear.  A test that changes air pressure in the middle ear to check how well the eardrum moves and whether the eustachian tube is working (tympanogram).  How is this treated? This condition usually goes away on its own within 3-5 days. But if the  condition is caused by a bacteria infection and does not go away own its own, or keeps coming back, your health care provider may:  Prescribe antibiotic medicines to treat the infection.  Prescribe or recommend medicines to control pain.  Follow these instructions at home:  Take over-the-counter and prescription medicines only as told by your health care provider.  If you were prescribed an antibiotic medicine, take it as told by your health care provider. Do not stop taking the antibiotic even if you start to feel better.  Keep all follow-up visits as told by your health care provider. This is important. Contact a health care provider if:  You have bleeding from your nose.  There is a lump on your neck.  You are not getting better in 5 days.  You feel worse instead of better. Get help right away if:  You have severe pain that is not controlled with medicine.  You have swelling, redness, or pain around your ear.  You have stiffness in your neck.  A part of your face is paralyzed.  The bone behind your ear (mastoid) is tender when you touch it.  You develop a severe headache. Summary  Otitis media is redness, soreness, and swelling of the middle ear.  This condition usually goes away on its own within 3-5 days.  If the problem does not go away in 3-5 days, your health care provider may prescribe or recommend  medicines to treat your symptoms.  If you were prescribed an antibiotic medicine, take it as told by your health care provider. This information is not intended to replace advice given to you by your health care provider. Make sure you discuss any questions you have with your health care provider. Document Released: 01/01/2004 Document Revised: 03/18/2016 Document Reviewed: 03/18/2016 Elsevier Interactive Patient Education  2017 [employer].

## 2017-02-24 MED ORDER — MECLIZINE HCL 25 MG PO TABS: ORAL_TABLET | ORAL | 0 refills | Status: DC

## 2017-02-24 NOTE — Addendum Note (Signed)
Addended by: Izora Ribas on: 02/24/2017 09:29 AM   Modules accepted: Orders

## 2017-03-06 ENCOUNTER — Encounter: Payer: Self-pay | Admitting: Internal Medicine

## 2017-03-06 ENCOUNTER — Ambulatory Visit: Payer: Medicare Other | Admitting: Internal Medicine

## 2017-03-06 VITALS — BP 122/66 | HR 64 | Temp 97.2°F | Resp 18 | Ht 65.0 in | Wt 224.0 lb

## 2017-03-06 DIAGNOSIS — E782 Mixed hyperlipidemia: Secondary | ICD-10-CM

## 2017-03-06 DIAGNOSIS — R7303 Prediabetes: Secondary | ICD-10-CM | POA: Diagnosis not present

## 2017-03-06 DIAGNOSIS — E039 Hypothyroidism, unspecified: Secondary | ICD-10-CM | POA: Diagnosis not present

## 2017-03-06 DIAGNOSIS — E669 Obesity, unspecified: Secondary | ICD-10-CM | POA: Diagnosis not present

## 2017-03-06 DIAGNOSIS — E559 Vitamin D deficiency, unspecified: Secondary | ICD-10-CM | POA: Diagnosis not present

## 2017-03-06 DIAGNOSIS — R7309 Other abnormal glucose: Secondary | ICD-10-CM

## 2017-03-06 DIAGNOSIS — I10 Essential (primary) hypertension: Secondary | ICD-10-CM | POA: Diagnosis not present

## 2017-03-06 DIAGNOSIS — Z79899 Other long term (current) drug therapy: Secondary | ICD-10-CM

## 2017-03-06 NOTE — Patient Instructions (Signed)

## 2017-03-06 NOTE — Progress Notes (Signed)
This very nice 71 y.o. MWF presents for 3 month follow up with Hypertension, Hyperlipidemia, Pre-Diabetes and Vitamin D Deficiency.      Patient is treated for HTN & reports home BP's are normal. Today's BP is at goal -  122/66.  In Apr 2017, she had an isolated episode of Afib with Ht cath by Dr Ellyn Hack finding Nl Coronary Aa, but low EF 25-30% and in July 2D echo found improved EF 45-50%.She denies any complaints of any cardiac type chest pain, palpitations, dyspnea / orthopnea / PND, dizziness, claudication, or dependent edema.     Hyperlipidemia is controlled with diet & meds. Patient denies myalgias or other med SE's. Last Lipids were at goal w/slelevated Trig's: Lab Results  Component Value Date   CHOL 169 11/24/2016   HDL 52 11/24/2016   LDLCALC 82 08/10/2016   TRIG 163 (H) 11/24/2016   CHOLHDL 3.3 11/24/2016      Also, the patient has history of  Morbid Obesity (BMI 37+) and consequent PreDiabetes (A1c 6.3% / June 2017) and has had no symptoms of reactive hypoglycemia, diabetic polys, paresthesias or visual blurring.  Last A1c was still not at goal: Lab Results  Component Value Date   HGBA1C 5.8 (H) 11/24/2016      Patient was initiated on Thyroid replacement in Mar 2016. Further, the patient also has history of Vitamin D Deficiency ("13" / 2016) and supplements vitamin D without any suspected side-effects. Last vitamin D was nearer goal: Lab Results  Component Value Date   VD25OH 49 12/31/2015   Current Outpatient Medications on File Prior to Visit  Medication Sig  . anastrozole (ARIMIDEX) 1 MG tablet Take 1 tablet (1 mg total) by mouth daily.  Marland Kitchen apixaban (ELIQUIS) 5 MG TABS tablet Take 1 tablet (5 mg total) by mouth 2 (two) times daily.  Marland Kitchen atorvastatin (LIPITOR) 80 MG tablet Take 0.5 tablets (40 mg total) by mouth daily.  Marland Kitchen buPROPion (WELLBUTRIN XL) 150 MG 24 hr tablet TAKE 1 TABLET (150 MG TOTAL) BY MOUTH EVERY MORNING.  . carvedilol (COREG) 25 MG tablet TAKE 1 TABLET 2  TIMES DAILY OR AS DIRECTED.  Marland Kitchen Cholecalciferol (VITAMIN D) 2000 UNITS tablet Take 4,000 Units by mouth daily.  . cyclobenzaprine (FLEXERIL) 10 MG tablet Take 10 mg by mouth at bedtime as needed (FOR RESTLESS LEGS).   Marland Kitchen docusate sodium (COLACE) 100 MG capsule Take 1 capsule (100 mg total) by mouth 2 (two) times daily.  . DULoxetine (CYMBALTA) 30 MG capsule Take 1 capsule (30 mg total) by mouth 3 (three) times daily. (Patient taking differently: Take 90 mg by mouth every morning. )  . DULoxetine (CYMBALTA) 30 MG capsule TAKE 1 CAPSULE BY MOUTH 3 TIMES DAILY  . ferrous sulfate 325 (65 FE) MG tablet TAKE 1 TABLET (325 MG TOTAL) BY MOUTH 2 (TWO) TIMES DAILY WITH A MEAL.  . furosemide (LASIX) 20 MG tablet TAKE  20 MG BY MOUTH TWICE A DAY AND MAY TAKE AN EXTRA TABLET DAILY IF NEEDED.  Marland Kitchen gabapentin (NEURONTIN) 300 MG capsule TAKE ONE CAPSULE BY MOUTH EVERY DAY AT BEDTIME  . ipratropium (ATROVENT) 0.03 % nasal spray Place 2 sprays into the nose 3 (three) times daily.  Marland Kitchen levothyroxine (SYNTHROID, LEVOTHROID) 50 MCG tablet TAKE 1/2 TO 1 TABLET ON AN EMPTY STOMACH EVERY MORNING 30 TO 60 MIN BEFORE BREAKFAST  . meclizine (ANTIVERT) 25 MG tablet 1/2-1 pill up to 3 times daily for motion sickness/dizziness  . meloxicam (MOBIC) 15  MG tablet TAKE 1/2 TO 1 TABLET DAILY WITH FOOD FOR PAIN *DONT TAKE WITH ALEVE OR IBUPROFEN CAN TAKE TYLENOL*  . mometasone (NASONEX) 50 MCG/ACT nasal spray USE 1 TO 2 SPRAYS IN EACH NOSTRIL TWICE DAILY  . ranitidine (ZANTAC) 300 MG tablet TAKE 1 TO 2 TABLETS DAILY AS NEEDED FOR ACID INDIGESTION & REFLUX  . temazepam (RESTORIL) 30 MG capsule TAKE ONE CAPSULE AT BEDTIME AS NEEDED FOR SLEEP (Patient taking differently: TAKE ONE CAPSULE AT BEDTIME)  . traMADol (ULTRAM) 50 MG tablet Take 1 tablet (50 mg total) by mouth every 12 (twelve) hours as needed.  . valsartan (DIOVAN) 80 MG tablet TAKE 1 TABLET BY MOUTH EVERY DAY   No current facility-administered medications on file prior to visit.      Allergies  Allergen Reactions  . Requip [Ropinirole Hcl] Shortness Of Breath and Nausea And Vomiting  . Minocycline Hives  . Tetracyclines & Related Hives  . Zestril [Lisinopril] Cough  . Zetia [Ezetimibe] Other (See Comments)    MYALGIA JOINT PAIN  . Amoxicillin Rash  . Codeine Other (See Comments)    fatigue  . Morphine Sulfate Other (See Comments)    Palpitations  . Sulfa Antibiotics Other (See Comments)    Headache (pt states that a blood pressure medicine caused a severe headache but doesn't remember a reaction to sulfa)   PMHx:   Past Medical History:  Diagnosis Date  . Allergy    Nasonex daily as needed  . Anemia   . Anxiety   . Arthritis   . Breast cancer ([place])     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  . Chronic back pain    cyst sitting on L4-5;slipped disc  . Depression    takes Cymbalta daily  . Diverticulosis   . Dysrhythmia   . GERD (gastroesophageal reflux disease)    takes Omeprazole daily  . Headache(784.0)    rare  . History of gastric ulcer at age 69  . History of shingles   . Hyperlipidemia    takes Atorvastatin 3 times a week  . Hypertension    takes Cardura nightly and Verapamil daily  . Hypothyroidism   . Insomnia    takes Restoril nightly  . Joint pain   . Nonischemic cardiomyopathy ([place]) 07/2015   Normal coronary arteries by cath. EF was 25-30% by echo. GR2 DD - likely related to LBBB in setting of A. fib  . Paroxysmal atrial fibrillation ([place]): CHA2DS2-VASc Score - starting Eliquis 08/06/2015   This patients CHA2DS2-VASc Score and unadjusted Ischemic Stroke Rate (% per year) is equal to 4.8 % stroke rate/year from a score of 4  Above score calculated as 1 point each if present [CHF, HTN, DM, Vascular=MI/PAD/Aortic Plaque, Age if 65-74, or Female]; 2 points each if present [Age > 75, or Stroke/TIA/TE]  . PONV (postoperative nausea and vomiting)   . S/P radiation therapy 07/18/2013-08/28/2013   1) Left Breast / 50 Gy  in 25 fractions/ 2) Left Breast Boost / 10 Gy in 5 fractions  . Shingles   . Sinus drainage    put on Levaquin yesterday if no better in 3 days will start prednisone  . Thyroid disease   . Weakness    tingling and numbness both hands and left leg  . Wears glasses    Immunization History  Administered Date(s) Administered  . Influenza Whole 01/01/2013  . Influenza, High Dose Seasonal PF 01/29/2014, 12/30/2014  . Influenza-Unspecified 12/11/2015, 02/04/2017  . Pneumococcal  Conjugate-13 06/16/2014  . Pneumococcal Polysaccharide-23 05/23/2011  . Tdap 03/11/2008  . Zoster 02/02/2010   Past Surgical History:  Procedure Laterality Date  . ABDOMINAL HYSTERECTOMY    . BREAST LUMPECTOMY Left    neg  . CARDIAC CATHETERIZATION N/A 08/10/2015   Procedure: Right/Left Heart Cath and Coronary Angiography;  Surgeon: Burnell Blanks, MD;  Location: [facility];  Service: Cardiovascular;: Nonobstructive CAD  . CATARACT EXTRACTION BILATERAL W/ ANTERIOR VITRECTOMY Bilateral   . CHOLECYSTECTOMY    . COLONOSCOPY  2014  . DUPUYTREN / PALMAR FASCIOTOMY Bilateral 2007   x 3 to left and once to the right   . epidural injections     x 3  . FOOT TENDON SURGERY Right ~ 11/2015   "stepped in a hole and tore 2 tendons"  . JOINT REPLACEMENT    . KNEE ARTHROSCOPY Bilateral   . LUMBAR FUSION  10/2013   Dr. Lynann Bologna  . TONSILLECTOMY    . TOTAL KNEE ARTHROPLASTY Left 09/23/2016  . TOTAL KNEE ARTHROPLASTY Left 09/23/2016   Procedure: TOTAL KNEE ARTHROPLASTY;  Surgeon: Dorna Leitz, MD;  Location: [facility];  Service: Orthopedics;  Laterality: Left;  . TRANSTHORACIC ECHOCARDIOGRAM  08/07/2015   Severely reduced LVEF at 25-30% with diffuse HK. GR 2 DD. Ventricular dyssynergy secondary to LBBB. Small to moderate pericardial effusion but no hemodynamic compromise  . TRANSTHORACIC ECHOCARDIOGRAM  10/2015    (EF up from 25-30%): - Left ventricle: Poor acoustic windows -  difficult to see.    EF estimated at  45-50% with inferoseptal and possible posterior hypokinesis. GR 1 DD. No pericardial effusion.  . TUBAL LIGATION    . UPPER GI ENDOSCOPY     FHx:    Reviewed / unchanged  SHx:    Reviewed / unchanged  Systems Review:  Constitutional: Denies fever, chills, wt changes, headaches, insomnia, fatigue, night sweats, change in appetite. Eyes: Denies redness, blurred vision, diplopia, discharge, itchy, watery eyes.  ENT: Denies discharge, congestion, post nasal drip, epistaxis, sore throat, earache, hearing loss, dental pain, tinnitus, vertigo, sinus pain, snoring.  CV: Denies chest pain, palpitations, irregular heartbeat, syncope, dyspnea, diaphoresis, orthopnea, PND, claudication or edema. Respiratory: denies cough, dyspnea, DOE, pleurisy, hoarseness, laryngitis, wheezing.  Gastrointestinal: Denies dysphagia, odynophagia, heartburn, reflux, water brash, abdominal pain or cramps, nausea, vomiting, bloating, diarrhea, constipation, hematemesis, melena, hematochezia  or hemorrhoids. Genitourinary: Denies dysuria, frequency, urgency, nocturia, hesitancy, discharge, hematuria or flank pain. Musculoskeletal: Denies arthralgias, myalgias, stiffness, jt. swelling, pain, limping or strain/sprain.  Skin: Denies pruritus, rash, hives, warts, acne, eczema or change in skin lesion(s). Neuro: No weakness, tremor, incoordination, spasms, paresthesia or pain. Psychiatric: Denies confusion, memory loss or sensory loss. Endo: Denies change in weight, skin or hair change.  Heme/Lymph: No excessive bleeding, bruising or enlarged lymph nodes.  Physical Exam  BP 122/66   Pulse 64   Temp (!) 97.2 F (36.2 C)   Resp 18   Ht 5\' 5"  (1.651 m)   Wt 224 lb (101.6 kg)   BMI 37.28 kg/m   Appears over  nourished, well groomed  and in no distress.  Eyes: PERRLA, EOMs, conjunctiva no swelling or erythema. Sinuses: No frontal/maxillary tenderness ENT/Mouth: EAC's clear, TM's nl w/o erythema, bulging. Nares clear w/o  erythema, swelling, exudates. Oropharynx clear without erythema or exudates. Oral hygiene is good. Tongue normal, non obstructing. Hearing intact.  Neck: Supple. Thyroid nl. Car 2+/2+ without bruits, nodes or JVD. Chest: Respirations nl with BS clear & equal w/o rales, rhonchi,  wheezing or stridor.  Cor: Heart sounds normal w/ regular rate and rhythm without sig. murmurs, gallops, clicks or rubs. Peripheral pulses normal and equal  without edema.  Abdomen: Soft & bowel sounds normal. Non-tender w/o guarding, rebound, hernias, masses or organomegaly.  Lymphatics: Unremarkable.  Musculoskeletal: Full ROM all peripheral extremities, joint stability, 5/5 strength and normal gait.  Skin: Warm, dry without exposed rashes, lesions or ecchymosis apparent.  Neuro: Cranial nerves intact, reflexes equal bilaterally. Sensory-motor testing grossly intact. Tendon reflexes grossly intact.  Pysch: Alert & oriented x 3.  Insight and judgement nl & appropriate. No ideations.  Assessment and Plan:  1. Essential hypertension  - Continue medication, monitor blood pressure at home.  - Continue DASH diet. Reminder to go to the ER if any CP,  SOB, nausea, dizziness, severe HA, changes vision/speech.  - CBC with Differential/Platelet - BASIC METABOLIC PANEL WITH GFR - Magnesium - TSH  2. Hyperlipidemia, mixed  - Continue diet/meds, exercise,& lifestyle modifications.  - Continue monitor periodic cholesterol/liver & renal functions   - Hepatic function panel - Lipid panel - TSH  3. Prediabetes  - Continue diet, exercise, lifestyle modifications.  - Monitor appropriate labs.  - Hemoglobin A1c - Insulin, random  4. Vitamin D deficiency  - Continue supplementation.  - VITAMIN D 25 Hydroxy   5. Abnormal glucose  - Insulin, random  6. Obesity (BMI 30-39.9)   7. Hypothyroidism  - TSH  8. Medication management - CBC with Differential/Platelet - BASIC METABOLIC PANEL WITH GFR - Hepatic  function panel - Magnesium - Lipid panel - TSH - Hemoglobin A1c - Insulin, random - VITAMIN D 25 Hydroxy         Discussed  regular exercise, BP monitoring, weight control to achieve/maintain BMI less than 25 and discussed med and SE's. Recommended labs to assess and monitor clinical status with further disposition pending results of labs. Over 30 minutes of exam, counseling, chart review was performed.

## 2017-03-07 LAB — LIPID PANEL
CHOL/HDL RATIO: 3.9 (calc) (ref <5.0)
CHOLESTEROL: 188 mg/dL (ref <200)
HDL: 48 mg/dL — ABNORMAL LOW (ref >50)
LDL Cholesterol (Calc): 106 mg/dL (calc) — ABNORMAL HIGH
Non-HDL Cholesterol (Calc): 140 mg/dL (calc) — ABNORMAL HIGH (ref <130)
Triglycerides: 215 mg/dL — ABNORMAL HIGH (ref <150)

## 2017-03-07 LAB — HEMOGLOBIN A1C
HEMOGLOBIN A1C: 6 %{Hb} — AB (ref <5.7)
MEAN PLASMA GLUCOSE: 126 (calc)
eAG (mmol/L): 7 (calc)

## 2017-03-07 LAB — BASIC METABOLIC PANEL WITH GFR
BUN: 21 mg/dL (ref 7–25)
CALCIUM: 9.5 mg/dL (ref 8.6–10.4)
CHLORIDE: 102 mmol/L (ref 98–110)
CO2: 26 mmol/L (ref 20–32)
Creat: 0.9 mg/dL (ref 0.60–0.93)
GFR, Est African American: 75 mL/min/{1.73_m2} (ref >=60)
GFR, Est Non African American: 65 mL/min/{1.73_m2} (ref >=60)
Glucose, Bld: 94 mg/dL (ref 65–99)
Potassium: 4.1 mmol/L (ref 3.5–5.3)
Sodium: 136 mmol/L (ref 135–146)

## 2017-03-07 LAB — CBC WITH DIFFERENTIAL/PLATELET
BASOS ABS: 87 {cells}/uL (ref 0–200)
Basophils Relative: 1 %
EOS ABS: 165 {cells}/uL (ref 15–500)
Eosinophils Relative: 1.9 %
HEMATOCRIT: 31.3 % — AB (ref 35.0–45.0)
HEMOGLOBIN: 10.6 g/dL — AB (ref 11.7–15.5)
Lymphs Abs: 1618 cells/uL (ref 850–3900)
MCH: 29.7 pg (ref 27.0–33.0)
MCHC: 33.9 g/dL (ref 32.0–36.0)
MCV: 87.7 fL (ref 80.0–100.0)
MPV: 11.1 fL (ref 7.5–12.5)
Monocytes Relative: 7.9 %
NEUTROS PCT: 70.6 %
Neutro Abs: 6142 cells/uL (ref 1500–7800)
Platelets: 270 10*3/uL (ref 140–400)
RBC: 3.57 10*6/uL — AB (ref 3.80–5.10)
RDW: 13.3 % (ref 11.0–15.0)
TOTAL LYMPHOCYTE: 18.6 %
WBC: 8.7 10*3/uL (ref 3.8–10.8)
WBCMIX: 687 {cells}/uL (ref 200–950)

## 2017-03-07 LAB — MAGNESIUM: MAGNESIUM: 2 mg/dL (ref 1.5–2.5)

## 2017-03-07 LAB — HEPATIC FUNCTION PANEL
AG Ratio: 1.5 (calc) (ref 1.0–2.5)
ALBUMIN MSPROF: 4.3 g/dL (ref 3.6–5.1)
ALT: 9 U/L (ref 6–29)
AST: 14 U/L (ref 10–35)
Alkaline phosphatase (APISO): 112 U/L (ref 33–130)
BILIRUBIN INDIRECT: 0.3 mg/dL (ref 0.2–1.2)
Bilirubin, Direct: 0 mg/dL (ref 0.0–0.2)
GLOBULIN: 2.8 g/dL (ref 1.9–3.7)
TOTAL PROTEIN: 7.1 g/dL (ref 6.1–8.1)
Total Bilirubin: 0.3 mg/dL (ref 0.2–1.2)

## 2017-03-07 LAB — INSULIN, RANDOM: Insulin: 26.6 u[IU]/mL — ABNORMAL HIGH (ref 2.0–19.6)

## 2017-03-07 LAB — TSH: TSH: 1.66 mIU/L (ref 0.40–4.50)

## 2017-03-07 LAB — VITAMIN D 25 HYDROXY (VIT D DEFICIENCY, FRACTURES): VIT D 25 HYDROXY: 38 ng/mL (ref 30–100)

## 2017-03-18 ENCOUNTER — Other Ambulatory Visit: Payer: Self-pay | Admitting: Internal Medicine

## 2017-03-27 ENCOUNTER — Telehealth: Payer: Self-pay | Admitting: *Deleted

## 2017-03-27 MED ORDER — LOSARTAN POTASSIUM 100 MG PO TABS: 100.0000 mg | ORAL_TABLET | Freq: Every day | ORAL | 3 refills | Status: DC

## 2017-03-27 NOTE — Telephone Encounter (Signed)
RECEIVED A FAX TO ALTERNATIVE REQUEST FOR VALSARTAN  DUECOST  WITH INSURANCE  MEDICATION WILL BE $4 DOLLAR INSTEAD OF $17  PER DR HARDING, OKAY TO SWITCH TO LOSARTAN 100 MG DAILY   90 DAY SUPPLY WITH 3 REFILLS VERBAL ORDER GIVEN TO ROBIN AT [facility].

## 2017-04-21 ENCOUNTER — Other Ambulatory Visit: Payer: Self-pay | Admitting: Physician Assistant

## 2017-05-01 ENCOUNTER — Other Ambulatory Visit: Payer: Self-pay | Admitting: Internal Medicine

## 2017-05-02 ENCOUNTER — Other Ambulatory Visit: Payer: Self-pay | Admitting: Family Medicine

## 2017-05-02 DIAGNOSIS — Z1239 Encounter for other screening for malignant neoplasm of breast: Secondary | ICD-10-CM

## 2017-05-24 ENCOUNTER — Other Ambulatory Visit: Payer: Self-pay | Admitting: Physician Assistant

## 2017-05-24 ENCOUNTER — Other Ambulatory Visit: Payer: Self-pay | Admitting: Internal Medicine

## 2017-05-24 DIAGNOSIS — I428 Other cardiomyopathies: Secondary | ICD-10-CM

## 2017-05-24 DIAGNOSIS — I480 Paroxysmal atrial fibrillation: Secondary | ICD-10-CM

## 2017-05-24 DIAGNOSIS — I5042 Chronic combined systolic (congestive) and diastolic (congestive) heart failure: Secondary | ICD-10-CM

## 2017-05-24 DIAGNOSIS — I10 Essential (primary) hypertension: Secondary | ICD-10-CM

## 2017-05-27 IMAGING — CR DG CHEST 2V
2 series · 2 of 2 positions shown · non-contrast
Comparison: 10/19/2013.  CT chest 10/18/2013.

CLINICAL DATA: Left breast and left arm pain. Left jaw pain with
dizziness and nausea for 1 week.

EXAM:
CHEST  2 VIEW

[chest pa]
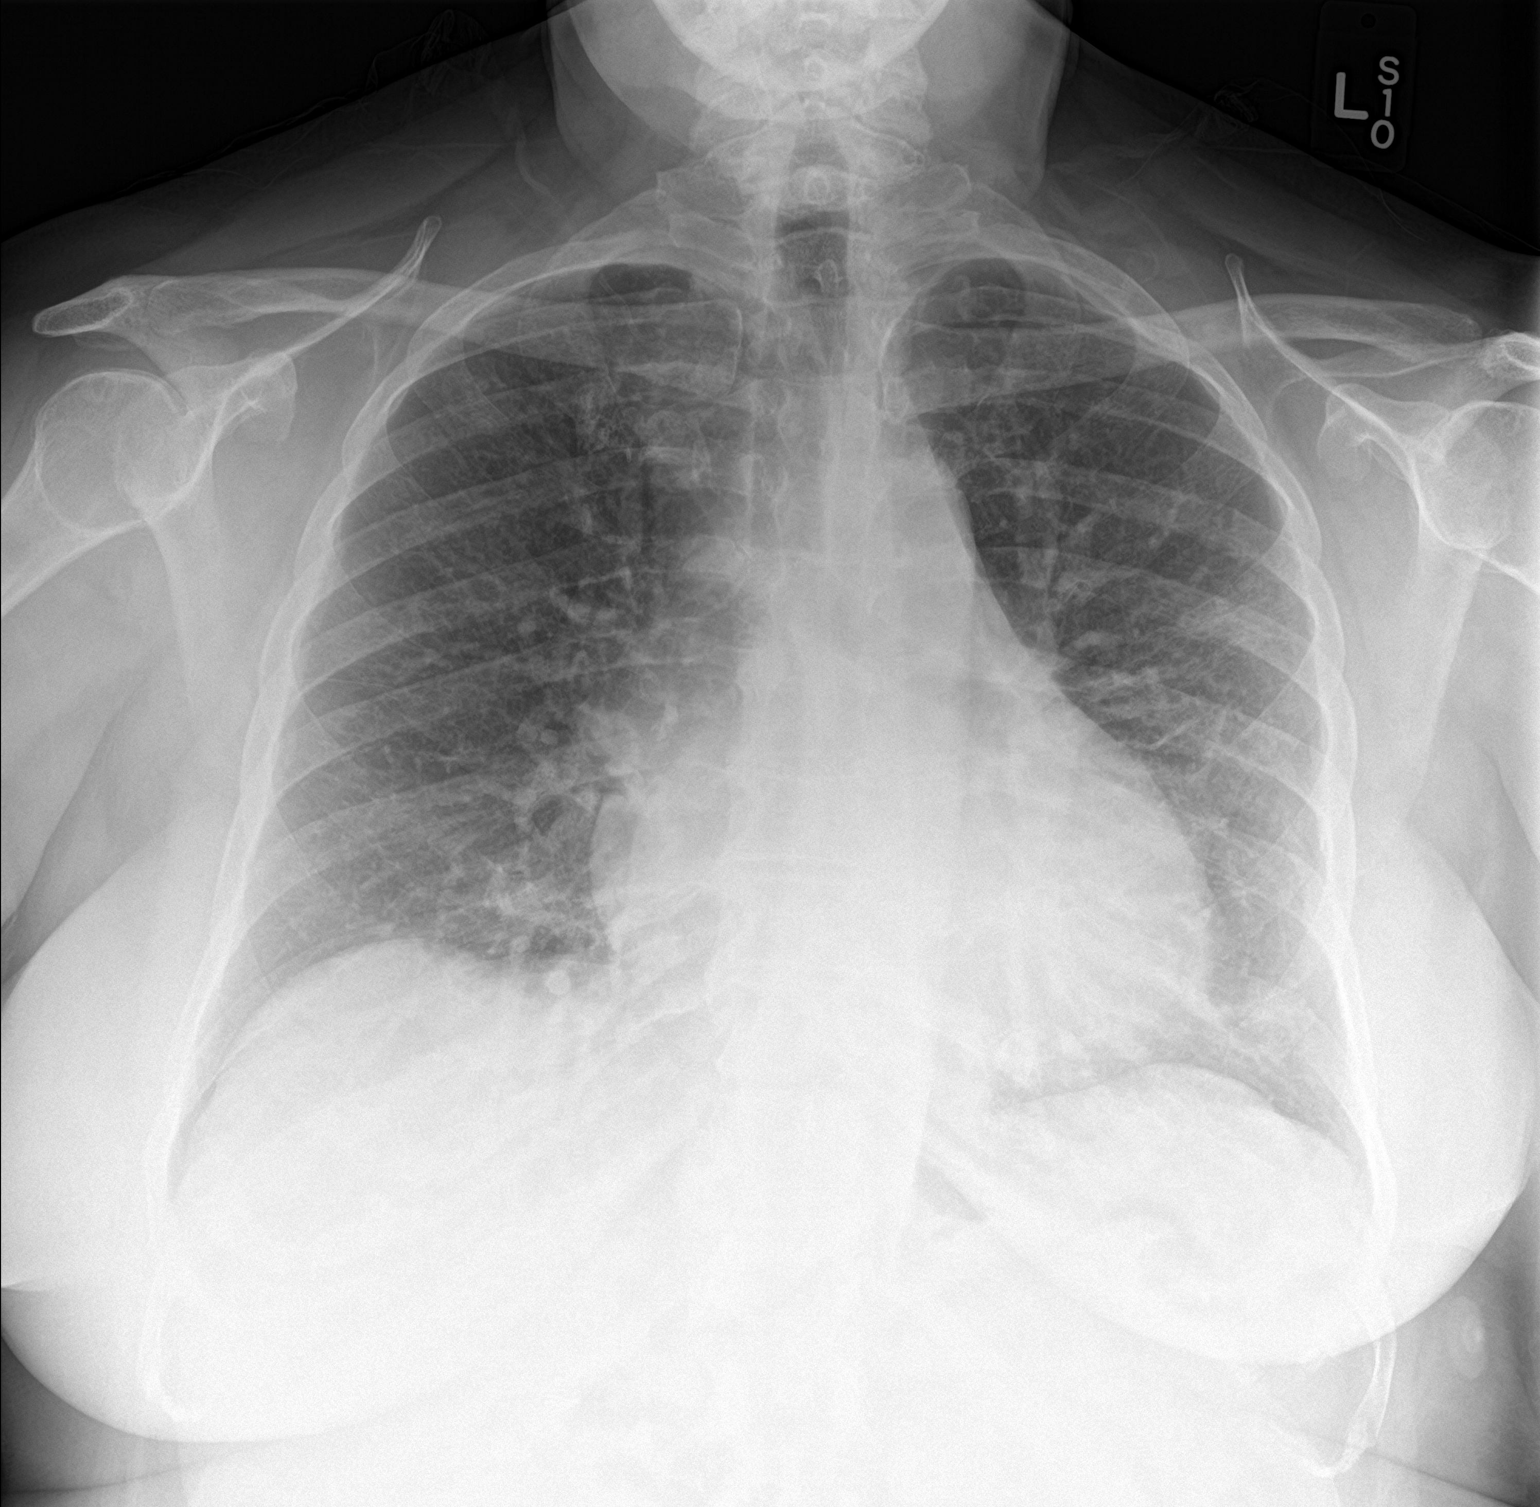

[chest lat]
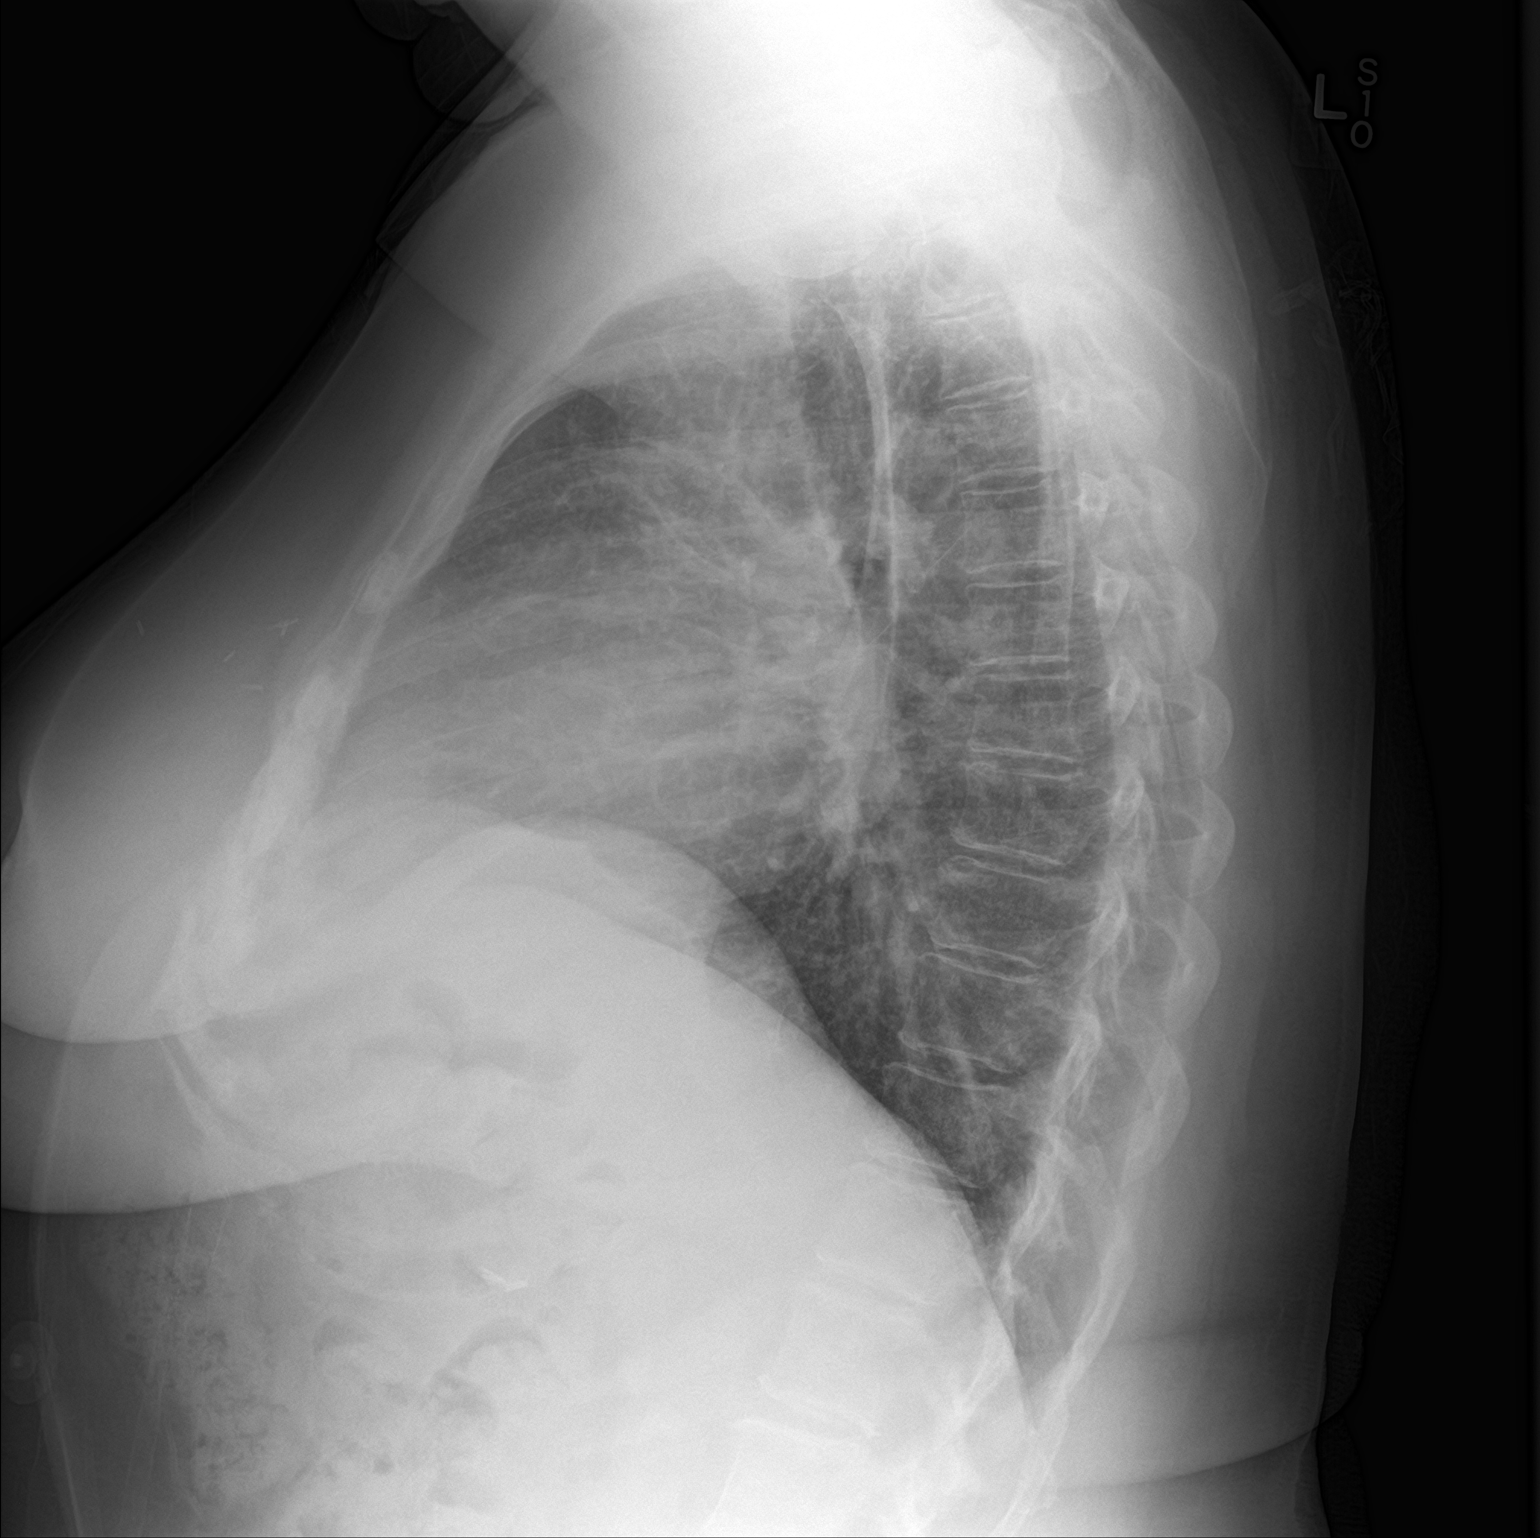

[2 of 2 positions shown; findings below may reference images not displayed]

FINDINGS: Trachea is midline. Heart size stable. There may be mild
interstitial prominence and indistinctness. No pleural fluid.
IMPRESSION: Question mild pulmonary edema.

## 2017-06-05 NOTE — Progress Notes (Deleted)
3 month OV  Assessment and Plan:   Essential hypertension - continue medications, DASH diet, exercise and monitor at home. Call if greater than 130/80.   -     CBC with Differential/Platelet -     BASIC METABOLIC PANEL WITH GFR -     Hepatic function panel -     TSH  Paroxysmal atrial fibrillation ([place]): CHA2DS2-VASc Score - starting Eliquis Control blood pressure, cholesterol, glucose, increase exercise.  Continue cardio follow up  Chronic combined systolic and diastolic heart failure, NYHA class 2 ([place]) -- exacerbated by A. Fib Weight stable Suggested increasing coreg to 25mg  BID to optimize meds  Non-ischemic cardiomyopathy (HCC) Weight stable Optimize meds  Paroxysmal supraventricular tachycardia (HCC) Control blood pressure, cholesterol, glucose, increase exercise.  Continue cardio follow up  Other specified hypothyroidism Hypothyroidism-check TSH level, continue medications the same, reminded to take on an empty stomach 30-76mins before food.  -     TSH  Hyperlipidemia -continue medications, check lipids, decrease fatty foods, increase activity.  -     Lipid panel  ANXIETY DEPRESSION Continue meds  Malignant neoplasm of lower-inner quadrant of left breast in female, estrogen receptor positive ([place]) Continue follow up oncology, continue meds  Morbid Obesity with co morbidities - long discussion about weight loss, diet, and exercise  Prediabetes Discussed general issues about diabetes pathophysiology and management., Educational material distributed., Suggested low cholesterol diet., Encouraged aerobic exercise., Discussed foot care., Reminded to get yearly retinal exam. -     Hemoglobin A1c  Vitamin D deficiency  Medication management -     Magnesium    Future Appointments  Date Time Provider [place]  06/06/2017  2:10 PM [facility] MM 2 [facility]  06/06/2017  3:45 PM Vicie Mutters, PA-C [facility] None  06/07/2017  2:00 PM Leonie Man, MD [facility]  07/13/2017  1:00 PM [facility] LAB 5 [facility] None  07/13/2017  1:30 PM Magrinat, Virgie Dad, MD [facility] None  09/05/2017  2:00 PM Liane Comber, NP [facility] None  11/30/2017 10:00 AM Vicie Mutters, PA-C [facility] None     Subjective:  [name] [name] is a 72 y.o. female who presents for 3 month follow up.  She has had elevated blood pressure for  years. Her blood pressure has been controlled at home, today their BP is   She does not workout regularly but does walk with her dog. She denies chest pain, shortness of breath, dizziness.  She has afib, on eliquis, follows with cardio. Occ flutters with walking.  Has never had sleep study. She is on cholesterol medication, lipitor 80mg  1/2 pill every day and denies myalgias. Her cholesterol is at goal. The cholesterol last visit was:   Lab Results  Component Value Date   CHOL 188 03/06/2017   HDL 48 (L) 03/06/2017   LDLCALC 82 08/10/2016   TRIG 215 (H) 03/06/2017   CHOLHDL 3.9 03/06/2017   She has been working on diet and exercise for prediabetes, and denies paresthesia of the feet, polydipsia, polyuria and visual disturbances. Last A1C in the office was:  Lab Results  Component Value Date   HGBA1C 6.0 (H) 03/06/2017  Patient is on Vitamin D supplement.   Lab Results  Component Value Date   VD25OH 38 03/06/2017   Follows with Dr. [name] for lower back pain.  She is on thyroid medication. Her medication was not changed last visit.   Lab Results  Component Value Date   TSH 1.66 03/06/2017  On cymbalta for  depression.  Follows with Dr. Griffith Citron for left breast cancer, had lumpectomy and radiation.  BMI is There is no height or weight on file to calculate BMI., she is working on diet and exercise. Wt Readings from Last 3 Encounters:  03/06/17 224 lb (101.6 kg)  02/22/17 222 lb 3.2 oz (100.8 kg)  11/29/16 225 lb (102.1 kg)     Medication Review: Current Outpatient Medications on File Prior to  Visit  Medication Sig Dispense Refill  . anastrozole (ARIMIDEX) 1 MG tablet Take 1 tablet (1 mg total) by mouth daily. 90 tablet 3  . apixaban (ELIQUIS) 5 MG TABS tablet Take 1 tablet (5 mg total) by mouth 2 (two) times daily. 180 tablet 3  . atorvastatin (LIPITOR) 80 MG tablet Take 0.5 tablets (40 mg total) by mouth daily. 90 tablet 3  . buPROPion (WELLBUTRIN XL) 150 MG 24 hr tablet TAKE 1 TABLET (150 MG TOTAL) BY MOUTH EVERY MORNING. 90 tablet 1  . carvedilol (COREG) 25 MG tablet TAKE 1 TABLET TWICE A DAY OR AS DIRECTED 180 tablet 1  . Cholecalciferol (VITAMIN D) 2000 UNITS tablet Take 4,000 Units by mouth daily.    . cyclobenzaprine (FLEXERIL) 10 MG tablet Take 10 mg by mouth at bedtime as needed (FOR RESTLESS LEGS).   1  . docusate sodium (COLACE) 100 MG capsule Take 1 capsule (100 mg total) by mouth 2 (two) times daily. 30 capsule 0  . DULoxetine (CYMBALTA) 30 MG capsule Take 1 capsule (30 mg total) by mouth 3 (three) times daily. (Patient taking differently: Take 90 mg by mouth every morning. ) 270 capsule 3  . DULoxetine (CYMBALTA) 30 MG capsule TAKE 1 CAPSULE BY MOUTH 3 TIMES DAILY 270 capsule 3  . ferrous sulfate 325 (65 FE) MG tablet TAKE 1 TABLET (325 MG TOTAL) BY MOUTH 2 (TWO) TIMES DAILY WITH A MEAL. 180 tablet 3  . furosemide (LASIX) 20 MG tablet TAKE 1 TABLET BY MOUTH TWICE A DAY 180 tablet 1  . gabapentin (NEURONTIN) 300 MG capsule TAKE ONE CAPSULE BY MOUTH EVERY DAY AT BEDTIME 90 capsule 1  . ipratropium (ATROVENT) 0.03 % nasal spray Place 2 sprays into the nose 3 (three) times daily. 54 mL 3  . levothyroxine (SYNTHROID, LEVOTHROID) 50 MCG tablet TAKE 1/2 TO 1 TABLET ON AN EMPTY STOMACH EVERY MORNING 30 TO 60 MIN BEFORE BREAKFAST 90 tablet 1  . losartan (COZAAR) 100 MG tablet Take 1 tablet (100 mg total) by mouth daily. 90 tablet 3  . meclizine (ANTIVERT) 25 MG tablet 1/2-1 pill up to 3 times daily for motion sickness/dizziness 30 tablet 0  . meloxicam (MOBIC) 15 MG tablet TAKE  1/2 TO 1 TABLET DAILY WITH FOOD FOR PAIN *DONT TAKE WITH ALEVE OR IBUPROFEN CAN TAKE TYLENOL* 90 tablet 1  . mometasone (NASONEX) 50 MCG/ACT nasal spray USE 1 TO 2 SPRAYS IN EACH NOSTRIL TWICE DAILY 17 g 99  . ranitidine (ZANTAC) 300 MG tablet TAKE 1 TO 2 TABLETS DAILY AS NEEDED FOR ACID INDIGESTION & REFLUX 180 tablet 1  . temazepam (RESTORIL) 30 MG capsule TAKE ONE CAPSULE BY MOUTH EVERY DAY AT BEDTIME AS NEEDED FOR SLEEP 90 capsule 1  . traMADol (ULTRAM) 50 MG tablet Take 1 tablet (50 mg total) by mouth every 12 (twelve) hours as needed. 60 tablet 0   No current facility-administered medications on file prior to visit.     Current Problems (verified) Patient Active Problem List   Diagnosis Date Noted  . Primary osteoarthritis  of left knee 09/23/2016  . Non-ischemic cardiomyopathy ([place])   . Chronic combined systolic and diastolic heart failure, NYHA class 2 ([place]) -- exacerbated by A. fib 08/08/2015  . Paroxysmal atrial fibrillation [facility]): CHA2DS2-VASc Score - starting Eliquis 08/06/2015  . Hypothyroidism 08/06/2015  . History of ulcer disease 08/06/2015  . Absolute anemia   . Encounter for Medicare annual wellness exam 03/31/2015  . DDD (degenerative disc disease), lumbar 06/16/2014  . Prediabetes 12/18/2013  . Vitamin D deficiency 12/18/2013  . Medication management 12/18/2013  . Paroxysmal supraventricular tachycardia ([place]) 10/18/2013  . Radiculopathy 10/16/2013  . Obesity (BMI 30-39.9) 06/11/2013  . Malignant neoplasm of lower-inner quadrant of left breast in female, estrogen receptor positive ([place]) 05/20/2013  . History of pancreatitis 03/27/2008  . Hyperlipidemia 03/13/2008  . Depression, major, recurrent, in partial remission ([place]) 03/13/2008  . Essential hypertension 03/13/2008  . H/O left bundle branch block 03/13/2008  . Allergic rhinitis 03/13/2008  . GERD 03/13/2008    Allergies Allergies  Allergen Reactions  . Requip [Ropinirole Hcl] Shortness Of Breath and  Nausea And Vomiting  . Minocycline Hives  . Tetracyclines & Related Hives  . Zestril [Lisinopril] Cough  . Zetia [Ezetimibe] Other (See Comments)    MYALGIA JOINT PAIN  . Amoxicillin Rash  . Codeine Other (See Comments)    fatigue  . Morphine Sulfate Other (See Comments)    Palpitations  . Sulfa Antibiotics Other (See Comments)    Headache (pt states that a blood pressure medicine caused a severe headache but doesn't remember a reaction to sulfa)    SURGICAL HISTORY She  has a past surgical history that includes Cataract extraction bilateral w/ anterior vitrectomy (Bilateral); Abdominal hysterectomy; Cholecystectomy; Knee arthroscopy (Bilateral); Tonsillectomy; Colonoscopy (2014); Upper gi endoscopy; Dupuytren / palmar fasciotomy (Bilateral, 2007); Tubal ligation; epidural injections; Lumbar fusion (10/2013); Cardiac catheterization (N/A, 08/10/2015); transthoracic echocardiogram (08/07/2015); transthoracic echocardiogram (10/2015); Joint replacement; Total knee arthroplasty (Left, 09/23/2016); Breast lumpectomy (Left); Foot Tendon Surgery (Right, ~ 11/2015); and Total knee arthroplasty (Left, 09/23/2016). FAMILY HISTORY Her family history includes COPD in her father; Heart attack in her mother; Hypertension in her father; Stroke in her mother. SOCIAL HISTORY She  reports that  has never smoked. she has never used smokeless tobacco. She reports that she drinks alcohol. She reports that she does not use drugs.  Review of Systems  Constitutional: Negative for chills, fever and malaise/fatigue.  HENT: Negative for congestion, ear pain and sore throat.   Eyes: Negative.   Respiratory: Negative for cough, shortness of breath and wheezing.   Cardiovascular: Positive for palpitations (flutters occ). Negative for chest pain and leg swelling.  Gastrointestinal: Negative for abdominal pain, blood in stool, constipation, diarrhea, heartburn and melena.  Genitourinary: Negative.   Musculoskeletal:  Positive for joint pain.  Skin: Negative.   Neurological: Negative for dizziness, sensory change, loss of consciousness and headaches.  Psychiatric/Behavioral: Negative for depression. The patient is not nervous/anxious and does not have insomnia.      Objective:     There were no vitals taken for this visit. There is no height or weight on file to calculate BMI.  General appearance: alert, no distress, WD/WN, female HEENT: normocephalic, sclerae anicteric, TMs pearly, nares patent, no discharge or erythema, pharynx normal, + maxillary sinus tenderness to palpation.  Oral cavity: MMM, no lesions Neck: supple, no lymphadenopathy, no thyromegaly, no masses Heart: RRR, normal S1, S2, no murmurs Lungs: CTA bilaterally, no wheezes, rhonchi, or rales Abdomen: +bs, soft, obese, + epigastric tenderness,  non distended, no masses, no hepatomegaly, no splenomegaly Musculoskeletal: nontender, no swelling, no obvious deformity Extremities: no edema, no cyanosis, no clubbing Pulses: 2+ symmetric, upper and lower extremities, normal cap refill Neurological: alert, oriented x 3, CN2-12 intact, strength normal upper extremities and lower extremities, sensation normal throughout, DTRs 2+ throughout, no cerebellar signs, gait antalgic Psychiatric: normal affect, behavior normal, pleasant    Vicie Mutters, PA-C   06/05/2017

## 2017-06-06 ENCOUNTER — Ambulatory Visit
Admission: RE | Admit: 2017-06-06 | Discharge: 2017-06-06 | Disposition: A | Discharge status: Home / Self Care | Payer: Medicare Other | Source: Ambulatory Visit | Attending: Family Medicine | Admitting: Family Medicine

## 2017-06-06 ENCOUNTER — Ambulatory Visit
Admission: RE | Admit: 2017-06-06 | Discharge: 2017-06-06 | Disposition: A | Discharge status: Home / Self Care | Payer: Medicare Other | Source: Ambulatory Visit | Attending: Oncology | Admitting: Oncology

## 2017-06-06 ENCOUNTER — Other Ambulatory Visit: Payer: Self-pay | Admitting: Oncology

## 2017-06-06 ENCOUNTER — Other Ambulatory Visit: Payer: Self-pay | Admitting: Family Medicine

## 2017-06-06 ENCOUNTER — Ambulatory Visit: Payer: Self-pay | Admitting: Physician Assistant

## 2017-06-06 DIAGNOSIS — Z853 Personal history of malignant neoplasm of breast: Secondary | ICD-10-CM

## 2017-06-06 DIAGNOSIS — Z1239 Encounter for other screening for malignant neoplasm of breast: Secondary | ICD-10-CM

## 2017-06-07 ENCOUNTER — Encounter: Payer: Self-pay | Admitting: Cardiology

## 2017-06-07 ENCOUNTER — Ambulatory Visit: Payer: Medicare Other | Admitting: Cardiology

## 2017-06-07 VITALS — BP 108/58 | HR 74 | Ht 65.0 in | Wt 217.8 lb

## 2017-06-07 DIAGNOSIS — I5042 Chronic combined systolic (congestive) and diastolic (congestive) heart failure: Secondary | ICD-10-CM

## 2017-06-07 DIAGNOSIS — E782 Mixed hyperlipidemia: Secondary | ICD-10-CM

## 2017-06-07 DIAGNOSIS — Z8679 Personal history of other diseases of the circulatory system: Secondary | ICD-10-CM | POA: Diagnosis not present

## 2017-06-07 DIAGNOSIS — I10 Essential (primary) hypertension: Secondary | ICD-10-CM

## 2017-06-07 DIAGNOSIS — I428 Other cardiomyopathies: Secondary | ICD-10-CM | POA: Diagnosis not present

## 2017-06-07 DIAGNOSIS — I480 Paroxysmal atrial fibrillation: Secondary | ICD-10-CM

## 2017-06-07 MED ORDER — ROSUVASTATIN CALCIUM 40 MG PO TABS: ORAL_TABLET | ORAL | 3 refills | Status: DC

## 2017-06-07 MED ORDER — COQ10 100 MG PO CAPS: 300.0000 mg | ORAL_CAPSULE | Freq: Every day | ORAL | 11 refills | Status: DC

## 2017-06-07 NOTE — Progress Notes (Signed)
PCP: Unk Pinto, MD  Clinic Note: Chief Complaint  Patient presents with  . Follow-up    Pt states she is doing well  . Atrial Fibrillation    With non-A. fib palpitations as well    HPI: [name] [name] is a 72 y.o. female with a PMH below who presents today for six-month follow-up with a history nonischemic cardiomyopathy (EF 45-50%), LBBB & palpitations - h/o Afib.   She has long history of intermittent left bundle branch block. Nonobstructive CAD (noted Angiographic normal coronary arteries in May 2017) She has had a history of A. Fib, but not on anticoagulation because of anemia.  In the interim since I last saw her, she's been having some fluttering sensations in her chest lasting less than a few seconds but was associated with nausea.  [name] [name] was last seen on 11/29/2016:noting more worrisome palpitations.  She was noticing fluttering sensations off and on.  At times associated with activity, but otherwise sometimes at rest..   Recent Hospitalizations:   None  Studies Personally Reviewed - (if available, images/films reviewed: From Epic Chart or Care Everywhere)  None   Interval History: [name] [name] comes in today doing well.  She says now her palpitations have notably improved.  She still has a fluttering sensation off and on but is very short-lived lasting less than a minute.  Nothing to suggest A. fib.  She says that she is starting to have more back pain that is limiting her activity, but has noted some exertional dyspnea as well.  Especially when going up stairs.  She is not sure if that is because of her deconditioning and obesity as well as back pain versus a symptom of by itself.  Cardiovascular ROS: positive for - dyspnea on exertion, irregular heartbeat, palpitations and Occasional dizziness negative for - chest pain, edema, orthopnea, paroxysmal nocturnal dyspnea, rapid heart rate, shortness of breath or Syncope/near syncope, TIA versus amaurosis  fugax.  No claudication  ROS: A comprehensive was performed. Review of Systems  Constitutional: Negative for malaise/fatigue.  HENT: Negative for congestion and nosebleeds.   Respiratory: Negative for cough and wheezing.        Per history of present illness  Cardiovascular: Negative for claudication.       Per history of present illness  Gastrointestinal: Negative for blood in stool, constipation, heartburn, melena and nausea.  Genitourinary: Negative for hematuria.  Musculoskeletal: Positive for back pain and joint pain (still has knee pain (B).).  Psychiatric/Behavioral: Negative for memory loss. The patient is not nervous/anxious and does not have insomnia.   All other systems reviewed and are negative.  I have reviewed and (if needed) personally updated the patient's problem list, medications, allergies, past medical and surgical history, social and family history.   Past Medical History:  Diagnosis Date  . Allergy    Nasonex daily as needed  . Anemia   . Anxiety   . Arthritis   . Breast cancer ([place])     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  . Chronic back pain    cyst sitting on L4-5;slipped disc  . Depression    takes Cymbalta daily  . Diverticulosis   . Dysrhythmia   . GERD (gastroesophageal reflux disease)    takes Omeprazole daily  . Headache(784.0)    rare  . History of gastric ulcer at age 67  . History of shingles   . Hyperlipidemia   . Hypertension    takes Cardura nightly and  Verapamil daily  . Hypothyroidism   . Insomnia    takes Restoril nightly  . Joint pain   . Nonischemic cardiomyopathy ([place]) 07/2015   Normal coronary arteries by cath. EF was 25-30% by echo. GR2 DD - likely related to LBBB in setting of A. fib  . Paroxysmal atrial fibrillation ([place]): CHA2DS2-VASc Score - starting Eliquis 08/06/2015   This patients CHA2DS2-VASc Score and unadjusted Ischemic Stroke Rate (% per year) is equal to 4.8 % stroke rate/year from a  score of 4  Above score calculated as 1 point each if present [CHF, HTN, DM, Vascular=MI/PAD/Aortic Plaque, Age if 65-74, or Female]; 2 points each if present [Age > 75, or Stroke/TIA/TE]  . PONV (postoperative nausea and vomiting)   . S/P radiation therapy 07/18/2013-08/28/2013   1) Left Breast / 50 Gy in 25 fractions/ 2) Left Breast Boost / 10 Gy in 5 fractions  . Shingles   . Sinus drainage    put on Levaquin yesterday if no better in 3 days will start prednisone  . Thyroid disease   . Weakness    tingling and numbness both hands and left leg  . Wears glasses     Past Surgical History:  Procedure Laterality Date  . ABDOMINAL HYSTERECTOMY    . BREAST LUMPECTOMY Left    neg  . CARDIAC CATHETERIZATION N/A 08/10/2015   Procedure: Right/Left Heart Cath and Coronary Angiography;  Surgeon: Burnell Blanks, MD;  Location: [facility];  Service: Cardiovascular;: Nonobstructive CAD  . CATARACT EXTRACTION BILATERAL W/ ANTERIOR VITRECTOMY Bilateral   . CHOLECYSTECTOMY    . COLONOSCOPY  2014  . DUPUYTREN / PALMAR FASCIOTOMY Bilateral 2007   x 3 to left and once to the right   . epidural injections     x 3  . FOOT TENDON SURGERY Right ~ 11/2015   "stepped in a hole and tore 2 tendons"  . JOINT REPLACEMENT    . KNEE ARTHROSCOPY Bilateral   . LUMBAR FUSION  10/2013   Dr. Lynann Bologna  . TONSILLECTOMY    . TOTAL KNEE ARTHROPLASTY Left 09/23/2016  . TOTAL KNEE ARTHROPLASTY Left 09/23/2016   Procedure: TOTAL KNEE ARTHROPLASTY;  Surgeon: Dorna Leitz, MD;  Location: [facility];  Service: Orthopedics;  Laterality: Left;  . TRANSTHORACIC ECHOCARDIOGRAM  08/07/2015   Severely reduced LVEF at 25-30% with diffuse HK. GR 2 DD. Ventricular dyssynergy secondary to LBBB. Small to moderate pericardial effusion but no hemodynamic compromise  . TRANSTHORACIC ECHOCARDIOGRAM  10/2015    (EF up from 25-30%): - Left ventricle: Poor acoustic windows -  difficult to see.    EF estimated at 45-50% with  inferoseptal and possible posterior hypokinesis. GR 1 DD. No pericardial effusion.  . TUBAL LIGATION    . UPPER GI ENDOSCOPY      Current Meds  Medication Sig  . anastrozole (ARIMIDEX) 1 MG tablet Take 1 tablet (1 mg total) by mouth daily.  Marland Kitchen apixaban (ELIQUIS) 5 MG TABS tablet Take 1 tablet (5 mg total) by mouth 2 (two) times daily.  Marland Kitchen atorvastatin (LIPITOR) 80 MG tablet Take 0.5 tablets (40 mg total) by mouth daily.  Marland Kitchen buPROPion (WELLBUTRIN XL) 150 MG 24 hr tablet TAKE 1 TABLET (150 MG TOTAL) BY MOUTH EVERY MORNING.  . carvedilol (COREG) 25 MG tablet TAKE 1 TABLET TWICE A DAY OR AS DIRECTED  . Cholecalciferol (VITAMIN D) 2000 UNITS tablet Take 4,000 Units by mouth daily.  . cyclobenzaprine (FLEXERIL) 10 MG tablet Take 10 mg by  mouth at bedtime as needed (FOR RESTLESS LEGS).   Marland Kitchen docusate sodium (COLACE) 100 MG capsule Take 1 capsule (100 mg total) by mouth 2 (two) times daily.  . DULoxetine (CYMBALTA) 30 MG capsule Take 1 capsule (30 mg total) by mouth 3 (three) times daily. (Patient taking differently: Take 90 mg by mouth every morning. )  . ferrous sulfate 325 (65 FE) MG tablet TAKE 1 TABLET (325 MG TOTAL) BY MOUTH 2 (TWO) TIMES DAILY WITH A MEAL.  . furosemide (LASIX) 20 MG tablet TAKE 1 TABLET BY MOUTH TWICE A DAY  . gabapentin (NEURONTIN) 300 MG capsule TAKE ONE CAPSULE BY MOUTH EVERY DAY AT BEDTIME  . ipratropium (ATROVENT) 0.03 % nasal spray Place 2 sprays into the nose 3 (three) times daily.  Marland Kitchen levothyroxine (SYNTHROID, LEVOTHROID) 50 MCG tablet TAKE 1/2 TO 1 TABLET ON AN EMPTY STOMACH EVERY MORNING 30 TO 60 MIN BEFORE BREAKFAST  . losartan (COZAAR) 100 MG tablet Take 1 tablet (100 mg total) by mouth daily.  . meclizine (ANTIVERT) 25 MG tablet 1/2-1 pill up to 3 times daily for motion sickness/dizziness  . meloxicam (MOBIC) 15 MG tablet TAKE 1/2 TO 1 TABLET DAILY WITH FOOD FOR PAIN *DONT TAKE WITH ALEVE OR IBUPROFEN CAN TAKE TYLENOL*  . mometasone (NASONEX) 50 MCG/ACT nasal spray USE  1 TO 2 SPRAYS IN EACH NOSTRIL TWICE DAILY  . ranitidine (ZANTAC) 300 MG tablet TAKE 1 TO 2 TABLETS DAILY AS NEEDED FOR ACID INDIGESTION & REFLUX  . temazepam (RESTORIL) 30 MG capsule TAKE ONE CAPSULE BY MOUTH EVERY DAY AT BEDTIME AS NEEDED FOR SLEEP  . traMADol (ULTRAM) 50 MG tablet Take 1 tablet (50 mg total) by mouth every 12 (twelve) hours as needed.  . [DISCONTINUED] DULoxetine (CYMBALTA) 30 MG capsule TAKE 1 CAPSULE BY MOUTH 3 TIMES DAILY    Allergies  Allergen Reactions  . Requip [Ropinirole Hcl] Shortness Of Breath and Nausea And Vomiting  . Minocycline Hives  . Tetracyclines & Related Hives  . Zestril [Lisinopril] Cough  . Zetia [Ezetimibe] Other (See Comments)    MYALGIA JOINT PAIN  . Amoxicillin Rash  . Codeine Other (See Comments)    fatigue  . Morphine Sulfate Other (See Comments)    Palpitations  . Sulfa Antibiotics Other (See Comments)    Headache (pt states that a blood pressure medicine caused a severe headache but doesn't remember a reaction to sulfa)    Social History   Socioeconomic History  . Marital status: Widowed    Spouse name: None  . Number of children: None  . Years of education: None  . Highest education level: None  Social Needs  . Financial resource strain: None  . Food insecurity - worry: None  . Food insecurity - inability: None  . Transportation needs - medical: None  . Transportation needs - non-medical: None  Occupational History  . None  Tobacco Use  . Smoking status: Never Smoker  . Smokeless tobacco: Never Used  Substance and Sexual Activity  . Alcohol use: Yes    Comment: wine occasionally-rarely  . Drug use: No  . Sexual activity: None  Other Topics Concern  . None  Social History Narrative  . None    family history includes COPD in her father; Heart attack in her mother; Hypertension in her father; Stroke in her mother.  Wt Readings from Last 3 Encounters:  06/07/17 217 lb 12.8 oz (98.8 kg)  03/06/17 224 lb (101.6 kg)   02/22/17 222 lb 3.2 oz (  100.8 kg)    PHYSICAL EXAM BP (!) 108/58 (BP Location: Right Arm, Patient Position: Sitting, Cuff Size: Normal)   Pulse 74   Ht 5\' 5"  (1.651 m)   Wt 217 lb 12.8 oz (98.8 kg)   SpO2 95%   BMI 36.24 kg/m  Physical Exam  Constitutional: She is oriented to person, place, and time. She appears well-developed and well-nourished. No distress.  Moderately obese.  Healthy-appearing  HENT:  Head: Normocephalic and atraumatic.  Neck: No hepatojugular reflux and no JVD present. Carotid bruit is not present.  Cardiovascular: Normal rate, regular rhythm, intact distal pulses and normal pulses.  No extrasystoles are present. PMI is not displaced. Exam reveals no gallop and no friction rub.  Murmur heard.  Medium-pitched harsh early systolic murmur is present with a grade of 1/6 at the upper left sternal border. Partially split S2  Pulmonary/Chest: Effort normal and breath sounds normal. No respiratory distress. She has no wheezes. She has no rales. She exhibits no tenderness.  Abdominal: Soft. Bowel sounds are normal. She exhibits no distension. There is no tenderness. There is no rebound.  Musculoskeletal: Normal range of motion. She exhibits no edema.  Neurological: She is alert and oriented to person, place, and time.  Psychiatric: She has a normal mood and affect. Her behavior is normal. Judgment and thought content normal.  Nursing note and vitals reviewed.     Adult ECG Report Not checked   Other studies Reviewed: Additional studies/ records that were reviewed today include:  Recent Labs:   Lab Results  Component Value Date   CHOL 188 03/06/2017   HDL 48 (L) 03/06/2017   LDLCALC 82 08/10/2016   TRIG 215 (H) 03/06/2017   CHOLHDL 3.9 03/06/2017   LDL 106   Lab Results  Component Value Date   CREATININE 0.90 03/06/2017   BUN 21 03/06/2017   NA 136 03/06/2017   K 4.1 03/06/2017   CL 102 03/06/2017   CO2 26 03/06/2017    ASSESSMENT / PLAN: Problem  List Items Addressed This Visit    Chronic combined systolic and diastolic heart failure, NYHA class 2 (HCC) -- exacerbated by A. fib (Chronic)    Appears to be euvolemic on low-dose Lasix.  Has not had to use additional dosing.  She is on stable dose of carvedilol and losartan.  Stable blood pressure.      Relevant Medications   rosuvastatin (CRESTOR) 40 MG tablet   Essential hypertension (Chronic)    Controlled on ARB and beta-blocker.  No change.      Relevant Medications   rosuvastatin (CRESTOR) 40 MG tablet   H/O left bundle branch block (Chronic)    Intermittently off and on EKG showing left bundle branch block.  This could explain somewhat reduced EF. Would not confuse for acute event if seen again.      Hyperlipidemia (Chronic)    Most recent LDL checked from November was not as good as last time.  I have asked that 1 of her doses of atorvastatin she goes back to the 80 mg dose.  Otherwise stay on 40 mg.      Relevant Medications   rosuvastatin (CRESTOR) 40 MG tablet   Non-ischemic cardiomyopathy (HCC) (Chronic)    Improved EF by follow-up echocardiogram.  I would suggest that some of the reduced EF is related to conduction delay but the significant drop was in the setting of A. fib. Euvolemic.  On beta-blocker and ARB as well as standing dose of Lasix.  Rarely using as needed dose.      Relevant Medications   rosuvastatin (CRESTOR) 40 MG tablet   Paroxysmal atrial fibrillation ([place]): CHA2DS2-VASc Score - starting Eliquis (Chronic)    She has PAF as well as PSVT.  Does not sound like she is having any of these episodes.  Certainly not A. fib.  Short flutters are probably not related to A. fib but may be related to short runs of PAT. Anticoagulate with Eliquis and rate controlled on beta-blocker.      Relevant Medications   rosuvastatin (CRESTOR) 40 MG tablet      Current medicines are reviewed at length with the patient today. (+/- concerns) n/a The following  changes have been made: n/a  Patient Instructions  MEDICATION INSTRUCTION   START OVER THE COUNTER  CoQ10 100 mg  Take one everyday for 1 week then increase by 100 mg every week until 300 mg daily.    Start taking 80 mg Crestor (rosuvastatin)(2 tablets) one day of the week , the other days take 40 mg ( 1 tablet).     No other changes    Your physician wants you to follow-up in 12 months with DR HARDING. You will receive a reminder letter in the mail two months in advance. If you don't receive a letter, please call our office to schedule the follow-up appointment.   If you need a refill on your cardiac medications before your next appointment, please call your pharmacy.    Studies Ordered:   No orders of the defined types were placed in this encounter.     Glenetta Hew, M.D., M.S. Interventional Cardiologist   Pager # [phone] Phone # [phone] [address] [place], [place] [address]

## 2017-06-07 NOTE — Patient Instructions (Addendum)
MEDICATION INSTRUCTION   START OVER THE COUNTER  CoQ10 100 mg  Take one everyday for 1 week then increase by 100 mg every week until 300 mg daily.    Start taking 80 mg Crestor (rosuvastatin)(2 tablets) one day of the week , the other days take 40 mg ( 1 tablet).     No other changes    Your physician wants you to follow-up in 12 months with DR HARDING. You will receive a reminder letter in the mail two months in advance. If you don't receive a letter, please call our office to schedule the follow-up appointment.   If you need a refill on your cardiac medications before your next appointment, please call your pharmacy.

## 2017-06-08 ENCOUNTER — Ambulatory Visit: Payer: Self-pay | Admitting: Adult Health

## 2017-06-09 ENCOUNTER — Encounter: Payer: Self-pay | Admitting: Cardiology

## 2017-06-09 NOTE — Assessment & Plan Note (Signed)
Most recent LDL checked from November was not as good as last time.  I have asked that 1 of her doses of atorvastatin she goes back to the 80 mg dose.  Otherwise stay on 40 mg.

## 2017-06-09 NOTE — Assessment & Plan Note (Signed)
Appears to be euvolemic on low-dose Lasix.  Has not had to use additional dosing.  She is on stable dose of carvedilol and losartan.  Stable blood pressure.

## 2017-06-09 NOTE — Assessment & Plan Note (Signed)
Controlled on ARB and beta-blocker.  No change.

## 2017-06-09 NOTE — Assessment & Plan Note (Signed)
Improved EF by follow-up echocardiogram.  I would suggest that some of the reduced EF is related to conduction delay but the significant drop was in the setting of A. fib. Euvolemic.  On beta-blocker and ARB as well as standing dose of Lasix.  Rarely using as needed dose.

## 2017-06-09 NOTE — Assessment & Plan Note (Signed)
She has PAF as well as PSVT.  Does not sound like she is having any of these episodes.  Certainly not A. fib.  Short flutters are probably not related to A. fib but may be related to short runs of PAT. Anticoagulate with Eliquis and rate controlled on beta-blocker.

## 2017-06-09 NOTE — Assessment & Plan Note (Signed)
Intermittently off and on EKG showing left bundle branch block.  This could explain somewhat reduced EF. Would not confuse for acute event if seen again.

## 2017-06-13 NOTE — Progress Notes (Signed)
3 month OV  Assessment and Plan:   Essential hypertension - continue medications, DASH diet, exercise and monitor at home. Call if greater than 130/80.   -     CBC with Differential/Platelet -     BASIC METABOLIC PANEL WITH GFR -     Hepatic function panel -     TSH  Paroxysmal atrial fibrillation ([place]): CHA2DS2-VASc Score - starting Eliquis Control blood pressure, cholesterol, glucose, increase exercise.  Continue cardio follow up  Chronic combined systolic and diastolic heart failure, NYHA class 2 ([place]) -- exacerbated by A. Fib Weight stable Suggested increasing coreg to 25mg  BID to optimize meds  Non-ischemic cardiomyopathy (H[facility]) Weight stable Optimize meds  Paroxysmal supraventricular tachycardia (H[facility]) Control blood pressure, cholesterol, glucose, increase exercise.  Continue cardio follow up  Other specified hypothyroidism Hypothyroidism-check TSH level, continue medications the same, reminded to take on an empty stomach 30-20mins before food.  -     TSH  Hyperlipidemia -continue medications, check lipids, decrease fatty foods, increase activity.  -     Lipid panel  Malignant neoplasm of lower-inner quadrant of left breast in female, estrogen receptor positive ([place]) Continue follow up oncology, continue meds  Morbid Obesity with co morbidities - long discussion about weight loss, diet, and exercise  Prediabetes Discussed general issues about diabetes pathophysiology and management., Educational material distributed., Suggested low cholesterol diet., Encouraged aerobic exercise., Discussed foot care., Reminded to get yearly retinal exam. -     Hemoglobin A1c  Medication management -     Magnesium  Sinusitis Will hold the zpak- can fill Friday and take if she is not getting better, increase fluids, rest, cont allergy pill, lungs CTAB, no edema, and weight is down.  Out of work until Monday  Future Appointments  Date Time Provider [place]  07/13/2017   1:00 PM [facility] 5 [facility] None  07/13/2017  1:30 PM [name] [name], [name] Dad, MD [facility] None  09/05/2017  2:00 PM [name] Comber, NP [facility] None  11/30/2017 10:00 AM [name] Mutters, PA-C [facility] None     Subjective:  [name] [name] is a 72 y.o. female who presents for 3 month follow up.  She has had cough/sinus issues x 5 days, getting worse. She has had chills. She is on benadryl, tylenol, and mucinex. Weight is down, no swelling in her legs. Works in [employer]- can return back to work [employer].   She has had elevated blood pressure for  years. Her blood pressure has been controlled at home, today their BP is BP: 124/82 She does not workout regularly but does walk with her dog. She denies chest pain, shortness of breath, dizziness.  She has afib, on eliquis, follows with cardio.She has CHF, weights daily, [name].   She is on cholesterol medication, lipitor 80mg  1/2 pill every day and denies myalgias. Her cholesterol is at goal. The cholesterol last visit was:   Lab Results  Component Value Date   CHOL 188 03/06/2017   HDL 48 (L) 03/06/2017   LDLCALC 82 08/10/2016   TRIG 215 (H) 03/06/2017   CHOLHDL 3.9 03/06/2017   She has been working on diet and exercise for prediabetes, and denies paresthesia of the feet, polydipsia, polyuria and visual disturbances. Last A1C in the office was:  Lab Results  Component Value Date   HGBA1C 6.0 (H) 03/06/2017  Patient is on Vitamin D supplement.   Lab Results  Component Value Date   VD25OH 38 03/06/2017   Follows with Dr.  Graves for lower back pain.  She is on thyroid medication. Her medication was not changed last visit.   Lab Results  Component Value Date   TSH 1.66 03/06/2017  On cymbalta for depression.  Follows with Dr. Griffith [name] for left breast cancer, had lumpectomy and radiation.  BMI is Body mass index is 35.61 kg/m., she is working on diet and exercise. Wt Readings from Last 3 Encounters:  06/14/17  214 lb (97.1 kg)  06/07/17 217 lb 12.8 oz (98.8 kg)  03/06/17 224 lb (101.6 kg)     Medication Review: Current Outpatient Medications on File Prior to Visit  Medication Sig Dispense Refill  . anastrozole (ARIMIDEX) 1 MG tablet Take 1 tablet (1 mg total) by mouth daily. 90 tablet 3  . apixaban (ELIQUIS) 5 MG TABS tablet Take 1 tablet (5 mg total) by mouth 2 (two) times daily. 180 tablet 3  . buPROPion (WELLBUTRIN XL) 150 MG 24 hr tablet TAKE 1 TABLET (150 MG TOTAL) BY MOUTH EVERY MORNING. 90 tablet 1  . carvedilol (COREG) 25 MG tablet TAKE 1 TABLET TWICE A DAY OR AS DIRECTED 180 tablet 1  . Cholecalciferol (VITAMIN D) 2000 UNITS tablet Take 4,000 Units by mouth daily.    . Coenzyme Q10 (COQ10) 100 MG CAPS Take 300 mg by mouth daily. 90 each 11  . cyclobenzaprine (FLEXERIL) 10 MG tablet Take 10 mg by mouth at bedtime as needed (FOR RESTLESS LEGS).   1  . docusate sodium (COLACE) 100 MG capsule Take 1 capsule (100 mg total) by mouth 2 (two) times daily. 30 capsule 0  . DULoxetine (CYMBALTA) 30 MG capsule Take 1 capsule (30 mg total) by mouth 3 (three) times daily. (Patient taking differently: Take 90 mg by mouth every morning. ) 270 capsule 3  . ferrous sulfate 325 (65 FE) MG tablet TAKE 1 TABLET (325 MG TOTAL) BY MOUTH 2 (TWO) TIMES DAILY WITH A MEAL. 180 tablet 3  . furosemide (LASIX) 20 MG tablet TAKE 1 TABLET BY MOUTH TWICE A DAY 180 tablet 1  . gabapentin (NEURONTIN) 300 MG capsule TAKE ONE CAPSULE BY MOUTH EVERY DAY AT BEDTIME 90 capsule 1  . ipratropium (ATROVENT) 0.03 % nasal spray Place 2 sprays into the nose 3 (three) times daily. 54 mL 3  . levothyroxine (SYNTHROID, LEVOTHROID) 50 MCG tablet TAKE 1/2 TO 1 TABLET ON AN EMPTY STOMACH EVERY MORNING 30 TO 60 MIN BEFORE BREAKFAST 90 tablet 1  . losartan (COZAAR) 100 MG tablet Take 1 tablet (100 mg total) by mouth daily. 90 tablet 3  . meclizine (ANTIVERT) 25 MG tablet 1/2-1 pill up to 3 times daily for motion sickness/dizziness 30 tablet 0   . meloxicam (MOBIC) 15 MG tablet TAKE 1/2 TO 1 TABLET DAILY WITH FOOD FOR PAIN *DONT TAKE WITH ALEVE OR IBUPROFEN CAN TAKE TYLENOL* 90 tablet 1  . rosuvastatin (CRESTOR) 40 MG tablet TAKE 40 MG EVERYDAY EXCEPT ON MONDAYS ,TAKE 80 MG BY MOUTH DAILY 102 tablet 3  . temazepam (RESTORIL) 30 MG capsule TAKE ONE CAPSULE BY MOUTH EVERY DAY AT BEDTIME AS NEEDED FOR SLEEP 90 capsule 1  . traMADol (ULTRAM) 50 MG tablet Take 1 tablet (50 mg total) by mouth every 12 (twelve) hours as needed. 60 tablet 0   No current facility-administered medications on file prior to visit.     Current Problems (verified) Patient Active Problem List   Diagnosis Date Noted  . Primary osteoarthritis of left knee 09/23/2016  . Non-ischemic cardiomyopathy ([place])   .  Chronic combined systolic and diastolic heart failure, NYHA class 2 ([place]) -- exacerbated by A. fib 08/08/2015  . Paroxysmal atrial fibrillation [facility]): CHA2DS2-VASc Score - starting Eliquis 08/06/2015  . Hypothyroidism 08/06/2015  . History of ulcer disease 08/06/2015  . Absolute anemia   . Encounter for Medicare annual wellness exam 03/31/2015  . DDD (degenerative disc disease), lumbar 06/16/2014  . Prediabetes 12/18/2013  . Vitamin D deficiency 12/18/2013  . Medication management 12/18/2013  . Paroxysmal supraventricular tachycardia ([place]) 10/18/2013  . Radiculopathy 10/16/2013  . Obesity (BMI 30-39.9) 06/11/2013  . Malignant neoplasm of lower-inner quadrant of left breast in female, estrogen receptor positive ([place]) 05/20/2013  . History of pancreatitis 03/27/2008  . Hyperlipidemia 03/13/2008  . Depression, major, recurrent, in partial remission ([place]) 03/13/2008  . Essential hypertension 03/13/2008  . H/O left bundle branch block 03/13/2008  . Allergic rhinitis 03/13/2008  . GERD 03/13/2008    Allergies Allergies  Allergen Reactions  . Requip [Ropinirole Hcl] Shortness Of Breath and Nausea And Vomiting  . Minocycline Hives  . Tetracyclines &  Related Hives  . Zestril [Lisinopril] Cough  . Zetia [Ezetimibe] Other (See Comments)    MYALGIA JOINT PAIN  . Amoxicillin Rash  . Codeine Other (See Comments)    fatigue  . Morphine Sulfate Other (See Comments)    Palpitations  . Sulfa Antibiotics Other (See Comments)    Headache (pt states that a blood pressure medicine caused a severe headache but doesn't remember a reaction to sulfa)    SURGICAL HISTORY She  has a past surgical history that includes Cataract extraction bilateral w/ anterior vitrectomy (Bilateral); Abdominal hysterectomy; Cholecystectomy; Knee arthroscopy (Bilateral); Tonsillectomy; Colonoscopy (2014); Upper gi endoscopy; Dupuytren / palmar fasciotomy (Bilateral, 2007); Tubal ligation; epidural injections; Lumbar fusion (10/2013); Cardiac catheterization (N/A, 08/10/2015); transthoracic echocardiogram (08/07/2015); transthoracic echocardiogram (10/2015); Joint replacement; Total knee arthroplasty (Left, 09/23/2016); Breast lumpectomy (Left); Foot Tendon Surgery (Right, ~ 11/2015); and Total knee arthroplasty (Left, 09/23/2016). FAMILY HISTORY Her family history includes COPD in her father; Heart attack in her mother; Hypertension in her father; Stroke in her mother. SOCIAL HISTORY She  reports that  has never smoked. she has never used smokeless tobacco. She reports that she drinks alcohol. She reports that she does not use drugs.  Review of Systems  Constitutional: Negative for chills, fever and malaise/fatigue.  HENT: Negative for congestion, ear pain and sore throat.   Eyes: Negative.   Respiratory: Negative for cough, shortness of breath and wheezing.   Cardiovascular: Positive for palpitations (flutters occ). Negative for chest pain and leg swelling.  Gastrointestinal: Negative for abdominal pain, blood in stool, constipation, diarrhea, heartburn and melena.  Genitourinary: Negative.   Musculoskeletal: Positive for joint pain.  Skin: Negative.   Neurological:  Negative for dizziness, sensory change, loss of consciousness and headaches.  Psychiatric/Behavioral: Negative for depression. The patient is not nervous/anxious and does not have insomnia.      Objective:     Blood pressure 124/82, pulse 68, temperature 97.6 F (36.4 C), resp. rate 16, height 5\' 5"  (1.651 m), weight 214 lb (97.1 kg). Body mass index is 35.61 kg/m.  General appearance: alert, no distress, WD/WN, female HEENT: normocephalic, sclerae anicteric, TMs pearly, nares patent, no discharge or erythema, pharynx normal, + maxillary sinus tenderness to palpation.  Oral cavity: MMM, no lesions Neck: supple, no lymphadenopathy, no thyromegaly, no masses Heart: RRR, normal S1, S2, no murmurs Lungs: CTA bilaterally, no wheezes, rhonchi, or rales Abdomen: +bs, soft, obese, nontender, non distended, no masses,  no hepatomegaly, no splenomegaly Musculoskeletal: nontender, no swelling, no obvious deformity Extremities: no edema, no cyanosis, no clubbing Pulses: 2+ symmetric, upper and lower extremities, normal cap refill Neurological: alert, oriented x 3, CN2-12 intact, strength normal upper extremities and lower extremities, sensation normal throughout, DTRs 2+ throughout, no cerebellar signs, gait antalgic Psychiatric: normal affect, behavior normal, pleasant    [name] Mutters, PA-C   06/14/2017

## 2017-06-14 ENCOUNTER — Encounter: Payer: Self-pay | Admitting: Physician Assistant

## 2017-06-14 ENCOUNTER — Other Ambulatory Visit: Payer: Self-pay | Admitting: *Deleted

## 2017-06-14 ENCOUNTER — Ambulatory Visit: Payer: Medicare Other | Admitting: Physician Assistant

## 2017-06-14 VITALS — BP 124/82 | HR 68 | Temp 97.6°F | Resp 16 | Ht 65.0 in | Wt 214.0 lb

## 2017-06-14 DIAGNOSIS — F3341 Major depressive disorder, recurrent, in partial remission: Secondary | ICD-10-CM | POA: Diagnosis not present

## 2017-06-14 DIAGNOSIS — I10 Essential (primary) hypertension: Secondary | ICD-10-CM | POA: Diagnosis not present

## 2017-06-14 DIAGNOSIS — Z170 Estrogen receptor positive status [ER+]: Secondary | ICD-10-CM

## 2017-06-14 DIAGNOSIS — I428 Other cardiomyopathies: Secondary | ICD-10-CM | POA: Diagnosis not present

## 2017-06-14 DIAGNOSIS — E782 Mixed hyperlipidemia: Secondary | ICD-10-CM | POA: Diagnosis not present

## 2017-06-14 DIAGNOSIS — I471 Supraventricular tachycardia: Secondary | ICD-10-CM | POA: Diagnosis not present

## 2017-06-14 DIAGNOSIS — E039 Hypothyroidism, unspecified: Secondary | ICD-10-CM | POA: Diagnosis not present

## 2017-06-14 DIAGNOSIS — R7309 Other abnormal glucose: Secondary | ICD-10-CM

## 2017-06-14 DIAGNOSIS — I480 Paroxysmal atrial fibrillation: Secondary | ICD-10-CM | POA: Diagnosis not present

## 2017-06-14 DIAGNOSIS — Z79899 Other long term (current) drug therapy: Secondary | ICD-10-CM | POA: Diagnosis not present

## 2017-06-14 DIAGNOSIS — C50312 Malignant neoplasm of lower-inner quadrant of left female breast: Secondary | ICD-10-CM

## 2017-06-14 MED ORDER — RANITIDINE HCL 300 MG PO TABS: ORAL_TABLET | ORAL | 1 refills | Status: DC

## 2017-06-14 MED ORDER — BENZONATATE 100 MG PO CAPS: 100.0000 mg | ORAL_CAPSULE | Freq: Three times a day (TID) | ORAL | 0 refills | Status: DC | PRN

## 2017-06-14 MED ORDER — AZITHROMYCIN 250 MG PO TABS: ORAL_TABLET | ORAL | 1 refills | Status: AC

## 2017-06-14 MED ORDER — TEMAZEPAM 30 MG PO CAPS: ORAL_CAPSULE | ORAL | 1 refills | Status: DC

## 2017-06-14 MED ORDER — MOMETASONE FUROATE 50 MCG/ACT NA SUSP: NASAL | 6 refills | Status: DC

## 2017-06-14 MED ORDER — PREDNISONE 20 MG PO TABS: ORAL_TABLET | ORAL | 0 refills | Status: DC

## 2017-06-14 NOTE — Patient Instructions (Addendum)
Make sure you are on an allergy pill, see below for more details. Please pick one of the over the counter allergy medications below and take it once daily for allergies.  Claritin or loratadine cheapest but likely the weakest  Zyrtec or certizine at night because it can make you sleepy The strongest is allegra or fexafinadine  Cheapest at [facility], [facility], [facility]  Please take the prednisone as directed below, this is NOT an antibiotic so you do NOT have to finish it. You can take it for a few days and stop it if you are doing better.   Please take the prednisone to help decrease inflammation and therefore decrease symptoms. Take it it with food to avoid GI upset. It can cause increased energy but on the other hand it can make it hard to sleep at night so please take it AT [place], it takes 8-12 hours to start working so it will NOT affect your sleeping if you take it at night with your food!!  If you are diabetic it will increase your sugars so decrease carbs and monitor your sugars closely.     HOW TO TREAT VIRAL COUGH AND COLD SYMPTOMS:  -Symptoms usually last at least 1 week with the worst symptoms being around day 4.  - colds usually start with a sore throat and end with a cough, and the cough can take 2 weeks to get better.  -No antibiotics are needed for colds, flu, sore throats, cough, bronchitis UNLESS symptoms are longer than 7 days OR if you are getting better then get drastically worse.  -There are a lot of combination medications (Dayquil, Nyquil, Vicks 44, tyelnol cold and sinus, ETC). Please look at the ingredients on the back so that you are treating the correct symptoms and not doubling up on medications/ingredients.    Medicines you can use  Nasal congestion  Little Remedies saline spray (aerosol/mist)- can try this, it is in the kids section - pseudoephedrine (Sudafed)- behind the counter, do not use if you have high blood pressure, medicine that have -D in them.  -  phenylephrine (Sudafed PE) -Dextormethorphan + chlorpheniramine (Coridcidin HBP)- okay if you have high blood pressure -Oxymetazoline (Afrin) nasal spray- LIMIT to 3 days -Saline nasal spray -Neti pot (used distilled or bottled water)  Ear pain/congestion  -pseudoephedrine (sudafed) - Nasonex/flonase nasal spray  Fever  -Acetaminophen (Tyelnol) -Ibuprofen (Advil, motrin, aleve)  Sore Throat  -Acetaminophen (Tyelnol) -Ibuprofen (Advil, motrin, aleve) -Drink a lot of water -Gargle with salt water - Rest your voice (don't talk) -Throat sprays -Cough drops  Body Aches  -Acetaminophen (Tyelnol) -Ibuprofen (Advil, motrin, aleve)  Headache  -Acetaminophen (Tyelnol) -Ibuprofen (Advil, motrin, aleve) - Exedrin, Exedrin Migraine  Allergy symptoms (cough, sneeze, runny nose, itchy eyes) -Claritin or loratadine cheapest but likely the weakest  -Zyrtec or certizine at night because it can make you sleepy -The strongest is allegra or fexafinadine  Cheapest at [facility], [employer], [facility]  Cough  -Dextromethorphan (Delsym)- medicine that has DM in it -Guafenesin (Mucinex/Robitussin) - cough drops - drink lots of water  Chest Congestion  -Guafenesin (Mucinex/Robitussin)  Red Itchy Eyes  - Naphcon-A  Upset Stomach  - Bland diet (nothing spicy, greasy, fried, and high acid foods like tomatoes, oranges, berries) -OKAY- cereal, bread, soup, crackers, rice -Eat smaller more frequent meals -reduce caffeine, no alcohol -Loperamide (Imodium-AD) if diarrhea -Prevacid for heart burn  General health when sick  -Hydration -wash your hands frequently -keep surfaces clean -change pillow cases and  sheets often -Get fresh air but do not exercise strenuously -Vitamin D, double up on it - Vitamin C -Zinc

## 2017-06-15 LAB — CBC WITH DIFFERENTIAL/PLATELET
Basophils Absolute: 30 cells/uL (ref 0–200)
Basophils Relative: 0.8 %
EOS ABS: 163 {cells}/uL (ref 15–500)
Eosinophils Relative: 4.3 %
HCT: 35 % (ref 35.0–45.0)
Hemoglobin: 11.9 g/dL (ref 11.7–15.5)
Lymphs Abs: 1186 cells/uL (ref 850–3900)
MCH: 29.7 pg (ref 27.0–33.0)
MCHC: 34 g/dL (ref 32.0–36.0)
MCV: 87.3 fL (ref 80.0–100.0)
MPV: 11.7 fL (ref 7.5–12.5)
Monocytes Relative: 9.1 %
NEUTROS PCT: 54.6 %
Neutro Abs: 2075 cells/uL (ref 1500–7800)
PLATELETS: 221 10*3/uL (ref 140–400)
RBC: 4.01 10*6/uL (ref 3.80–5.10)
RDW: 12.9 % (ref 11.0–15.0)
TOTAL LYMPHOCYTE: 31.2 %
WBC: 3.8 10*3/uL (ref 3.8–10.8)
WBCMIX: 346 {cells}/uL (ref 200–950)

## 2017-06-15 LAB — BASIC METABOLIC PANEL WITH GFR
BUN / CREAT RATIO: 13 (calc) (ref 6–22)
BUN: 13 mg/dL (ref 7–25)
CO2: 24 mmol/L (ref 20–32)
Calcium: 9.3 mg/dL (ref 8.6–10.4)
Chloride: 104 mmol/L (ref 98–110)
Creat: 0.99 mg/dL — ABNORMAL HIGH (ref 0.60–0.93)
GFR, Est African American: 66 mL/min/{1.73_m2} (ref >=60)
GFR, Est Non African American: 57 mL/min/{1.73_m2} — ABNORMAL LOW (ref >=60)
GLUCOSE: 89 mg/dL (ref 65–99)
Potassium: 4.5 mmol/L (ref 3.5–5.3)
SODIUM: 139 mmol/L (ref 135–146)

## 2017-06-15 LAB — HEPATIC FUNCTION PANEL
AG RATIO: 1.6 (calc) (ref 1.0–2.5)
ALKALINE PHOSPHATASE (APISO): 90 U/L (ref 33–130)
ALT: 13 U/L (ref 6–29)
AST: 20 U/L (ref 10–35)
Albumin: 4.3 g/dL (ref 3.6–5.1)
BILIRUBIN DIRECT: 0.1 mg/dL (ref 0.0–0.2)
BILIRUBIN INDIRECT: 0.3 mg/dL (ref 0.2–1.2)
Globulin: 2.7 g/dL (calc) (ref 1.9–3.7)
TOTAL PROTEIN: 7 g/dL (ref 6.1–8.1)
Total Bilirubin: 0.4 mg/dL (ref 0.2–1.2)

## 2017-06-15 LAB — HEMOGLOBIN A1C
HEMOGLOBIN A1C: 6.1 %{Hb} — AB (ref <5.7)
MEAN PLASMA GLUCOSE: 128 (calc)
eAG (mmol/L): 7.1 (calc)

## 2017-06-15 LAB — LIPID PANEL
CHOL/HDL RATIO: 7.4 (calc) — AB (ref <5.0)
CHOLESTEROL: 272 mg/dL — AB (ref <200)
HDL: 37 mg/dL — ABNORMAL LOW (ref >50)
LDL Cholesterol (Calc): 181 mg/dL (calc) — ABNORMAL HIGH
NON-HDL CHOLESTEROL (CALC): 235 mg/dL — AB (ref <130)
Triglycerides: 317 mg/dL — ABNORMAL HIGH (ref <150)

## 2017-06-15 LAB — TSH: TSH: 2.16 mIU/L (ref 0.40–4.50)

## 2017-06-15 LAB — MAGNESIUM: Magnesium: 2 mg/dL (ref 1.5–2.5)

## 2017-06-22 ENCOUNTER — Other Ambulatory Visit: Payer: Self-pay | Admitting: Physician Assistant

## 2017-06-22 ENCOUNTER — Other Ambulatory Visit: Payer: Self-pay | Admitting: Internal Medicine

## 2017-06-23 ENCOUNTER — Other Ambulatory Visit: Payer: Self-pay | Admitting: Cardiology

## 2017-07-13 ENCOUNTER — Other Ambulatory Visit: Payer: Self-pay | Admitting: *Deleted

## 2017-07-13 ENCOUNTER — Telehealth: Payer: Self-pay | Admitting: Oncology

## 2017-07-13 ENCOUNTER — Inpatient Hospital Stay: Payer: Medicare Other

## 2017-07-13 ENCOUNTER — Inpatient Hospital Stay: Payer: Medicare Other | Attending: Oncology | Admitting: Oncology

## 2017-07-13 VITALS — BP 143/61 | HR 79 | Temp 98.6°F | Resp 18 | Ht 65.0 in | Wt 219.0 lb

## 2017-07-13 DIAGNOSIS — C50312 Malignant neoplasm of lower-inner quadrant of left female breast: Secondary | ICD-10-CM

## 2017-07-13 DIAGNOSIS — Z170 Estrogen receptor positive status [ER+]: Principal | ICD-10-CM

## 2017-07-13 DIAGNOSIS — I471 Supraventricular tachycardia: Secondary | ICD-10-CM

## 2017-07-13 DIAGNOSIS — Z79811 Long term (current) use of aromatase inhibitors: Secondary | ICD-10-CM | POA: Diagnosis not present

## 2017-07-13 DIAGNOSIS — Z7901 Long term (current) use of anticoagulants: Secondary | ICD-10-CM | POA: Insufficient documentation

## 2017-07-13 DIAGNOSIS — Z923 Personal history of irradiation: Secondary | ICD-10-CM | POA: Diagnosis not present

## 2017-07-13 DIAGNOSIS — D649 Anemia, unspecified: Secondary | ICD-10-CM | POA: Insufficient documentation

## 2017-07-13 DIAGNOSIS — I5042 Chronic combined systolic (congestive) and diastolic (congestive) heart failure: Secondary | ICD-10-CM

## 2017-07-13 DIAGNOSIS — Z79899 Other long term (current) drug therapy: Secondary | ICD-10-CM | POA: Diagnosis not present

## 2017-07-13 LAB — CMP (CANCER CENTER ONLY)
ALK PHOS: 98 U/L (ref 40–150)
ALT: 13 U/L (ref 0–55)
AST: 15 U/L (ref 5–34)
Albumin: 3.6 g/dL (ref 3.5–5.0)
Anion gap: 9 (ref 3–11)
BILIRUBIN TOTAL: 0.2 mg/dL (ref 0.2–1.2)
BUN: 21 mg/dL (ref 7–26)
CALCIUM: 9.8 mg/dL (ref 8.4–10.4)
CO2: 26 mmol/L (ref 22–29)
CREATININE: 0.87 mg/dL (ref 0.60–1.10)
Chloride: 105 mmol/L (ref 98–109)
GFR, Estimated: >60 mL/min (ref >60)
Glucose, Bld: 112 mg/dL (ref 70–140)
Potassium: 4 mmol/L (ref 3.5–5.1)
Sodium: 140 mmol/L (ref 136–145)
Total Protein: 6.9 g/dL (ref 6.4–8.3)

## 2017-07-13 LAB — CBC WITH DIFFERENTIAL (CANCER CENTER ONLY)
Basophils Absolute: 0 10*3/uL (ref 0.0–0.1)
Basophils Relative: 1 %
EOS PCT: 4 %
Eosinophils Absolute: 0.2 10*3/uL (ref 0.0–0.5)
HEMATOCRIT: 30.5 % — AB (ref 34.8–46.6)
Hemoglobin: 10.1 g/dL — ABNORMAL LOW (ref 11.6–15.9)
LYMPHS ABS: 1.5 10*3/uL (ref 0.9–3.3)
LYMPHS PCT: 22 %
MCH: 30.2 pg (ref 25.1–34.0)
MCHC: 33.1 g/dL (ref 31.5–36.0)
MCV: 91.3 fL (ref 79.5–101.0)
Monocytes Absolute: 0.4 10*3/uL (ref 0.1–0.9)
Monocytes Relative: 7 %
NEUTROS ABS: 4.4 10*3/uL (ref 1.5–6.5)
Neutrophils Relative %: 66 %
Platelet Count: 245 10*3/uL (ref 145–400)
RBC: 3.34 MIL/uL — AB (ref 3.70–5.45)
RDW: 13.5 % (ref 11.2–14.5)
WBC: 6.5 10*3/uL (ref 3.9–10.3)

## 2017-07-13 MED ORDER — ANASTROZOLE 1 MG PO TABS: 1.0000 mg | ORAL_TABLET | Freq: Every day | ORAL | 3 refills | Status: DC

## 2017-07-13 NOTE — Telephone Encounter (Signed)
Gave avs and calendar ° °

## 2017-07-13 NOTE — Progress Notes (Signed)
[place]  [phone] [phone]     ID: ASHANTAE PANGALLO DOB: 05/16/1945  MR#: [id number]  VFI#:[id number]  Patient Care Team: Unk Pinto, MD as PCP - General (Internal Medicine) Zari Cly, Virgie Dad, MD as Consulting Physician (Oncology) Inda Castle, MD (Inactive) as Consulting Physician (Gastroenterology) Leonie Man, MD as Consulting Physician (Cardiology) Dorna Leitz, MD as Consulting Physician (Orthopedic Surgery) Rolm Bookbinder, MD as Consulting Physician (General Surgery) Eppie Gibson, MD as Attending Physician (Radiation Oncology) Phylliss Bob, MD as Consulting Physician (Orthopedic Surgery) OTHER MD:   CHIEF COMPLAINT: Estrogen receptor positive breast cancer  CURRENT TREATMENT: Anastrozole   BREAST CANCER HISTORY: From doctor Khan's earlier note:  "[name] [name] is a 72 y.o. female with  1. A past medical history significant for anxiety GERD shingles anemia and hypertension. Patient also has a family history significant for breast cancer in a sister. Patient underwent a screening mammogram that showed a breast density category C. She was noted to have a mass in the lower inner quadrant of the left breast with spiculated margins and associated microcalcifications. This measures 10 mm. She had an ultrasound performed that showed irregular hypoechoic mass at the 9:00 position 67 cm from the nipple. This measured 5 x 4 x 5 mm. She had a core needle biopsy performed that revealed invasive lobular carcinoma, grade 2, tumor was estrogen receptor positive progesterone receptor positive HER-2/neu negative with a proliferation marker Ki-67 14%. Patient was seen by Dr. Rolm Bookbinder on 05/22/2013 to discuss surgical treatment options. Since then she underwent a left breast radioactive seed guided lumpectomy and axillary sentinel lymph node biopsy. Her final pathology did reveal a 1.8 cm, grade 1, invasive lobular  carcinoma Sentinel node was negative for metastatic disease all margins were clear. Tumor again was ER positive PR positive HER-2/neu negative. She overall tolerated the procedure well.  2. Patient underwent adjuvant radiation therapy under the care of Dr. Isidore Moos from 07/18/13 through 08/28/13."  Her subsequent history is as detailed below  INTERVAL HISTORY: [name] [name] returns today for follow-up of her estrogen receptor positive breast cancer.  She continues on anastrozole, which she tolerates well. She notes her hot flashes are worse in the summer. She denies vaginal wetness.  On 06/06/2017 she underwent a bilateral diagnostic breast mammogram with tomography, breast density category B, showing no evidence of malignancy and expected post lumpectomy changes involving the left breast.    REVIEW OF SYSTEMS: Francene is doing well overall. Since her last visit here she had a left knee replacement. She reports that it has improved her pain and she is able to go up the stairs without pain in her knee. She adds having back pain from a slipped disc. She is being followed by Dr. [name] for this issue. For exercise she walks her dog. She denies unusual headaches, visual changes, nausea, vomiting, or dizziness. There has been no unusual cough, phlegm production, or pleurisy. This been no change in bowel or bladder habits. She denies unexplained fatigue or unexplained weight loss, bleeding, rash, or fever. A detailed review of systems was otherwise noncontributory.    PAST MEDICAL HISTORY: Past Medical History:  Diagnosis Date  . Allergy    Nasonex daily as needed  . Anemia   . Anxiety   . Arthritis   . Breast cancer ([place])     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  . Chronic back pain    cyst sitting on L4-5;slipped disc  . Depression  takes Cymbalta daily  . Diverticulosis   . Dysrhythmia   . GERD (gastroesophageal reflux disease)    takes Omeprazole daily  . Headache(784.0)      rare  . History of gastric ulcer at age 28  . History of shingles   . Hyperlipidemia   . Hypertension    takes Cardura nightly and Verapamil daily  . Hypothyroidism   . Insomnia    takes Restoril nightly  . Joint pain   . Nonischemic cardiomyopathy ([place]) 07/2015   Normal coronary arteries by cath. EF was 25-30% by echo. GR2 DD - likely related to LBBB in setting of A. fib  . Obesity (BMI 30-39.9) 06/11/2013  . Paroxysmal atrial fibrillation ([place]): CHA2DS2-VASc Score - starting Eliquis 08/06/2015   This patients CHA2DS2-VASc Score and unadjusted Ischemic Stroke Rate (% per year) is equal to 4.8 % stroke rate/year from a score of 4  Above score calculated as 1 point each if present [CHF, HTN, DM, Vascular=MI/PAD/Aortic Plaque, Age if 65-74, or Female]; 2 points each if present [Age > 75, or Stroke/TIA/TE]  . PONV (postoperative nausea and vomiting)   . S/P radiation therapy 07/18/2013-08/28/2013   1) Left Breast / 50 Gy in 25 fractions/ 2) Left Breast Boost / 10 Gy in 5 fractions  . Shingles   . Sinus drainage    put on Levaquin yesterday if no better in 3 days will start prednisone  . Thyroid disease   . Weakness    tingling and numbness both hands and left leg  . Wears glasses     PAST SURGICAL HISTORY: Past Surgical History:  Procedure Laterality Date  . ABDOMINAL HYSTERECTOMY    . BREAST LUMPECTOMY Left    neg  . CARDIAC CATHETERIZATION N/A 08/10/2015   Procedure: Right/Left Heart Cath and Coronary Angiography;  Surgeon: Burnell Blanks, MD;  Location: [facility];  Service: Cardiovascular;: Nonobstructive CAD  . CATARACT EXTRACTION BILATERAL W/ ANTERIOR VITRECTOMY Bilateral   . CHOLECYSTECTOMY    . COLONOSCOPY  2014  . DUPUYTREN / PALMAR FASCIOTOMY Bilateral 2007   x 3 to left and once to the right   . epidural injections     x 3  . FOOT TENDON SURGERY Right ~ 11/2015   "stepped in a hole and tore 2 tendons"  . JOINT REPLACEMENT    . KNEE ARTHROSCOPY Bilateral    . LUMBAR FUSION  10/2013   Dr. Lynann Bologna  . TONSILLECTOMY    . TOTAL KNEE ARTHROPLASTY Left 09/23/2016  . TOTAL KNEE ARTHROPLASTY Left 09/23/2016   Procedure: TOTAL KNEE ARTHROPLASTY;  Surgeon: Dorna Leitz, MD;  Location: [facility];  Service: Orthopedics;  Laterality: Left;  . TRANSTHORACIC ECHOCARDIOGRAM  08/07/2015   Severely reduced LVEF at 25-30% with diffuse HK. GR 2 DD. Ventricular dyssynergy secondary to LBBB. Small to moderate pericardial effusion but no hemodynamic compromise  . TRANSTHORACIC ECHOCARDIOGRAM  10/2015    (EF up from 25-30%): - Left ventricle: Poor acoustic windows -  difficult to see.    EF estimated at 45-50% with inferoseptal and possible posterior hypokinesis. GR 1 DD. No pericardial effusion.  . TUBAL LIGATION    . UPPER GI ENDOSCOPY      FAMILY HISTORY Family History  Problem Relation Age of Onset  . Heart attack Mother   . Stroke Mother   . COPD Father   . Hypertension Father   . Colon polyps Neg Hx   . Esophageal cancer Neg Hx   . Rectal cancer  Neg Hx   . Stomach cancer Neg Hx    the patient's father died at the age of 75 from uremia. The patient's mother died at the age of 23 from a heart attack. The patient has one brother, 5 sisters. One of her sisters was diagnosed with breast cancer in her 67s. There is no history of ovarian cancer in the family  GYNECOLOGIC HISTORY:  No LMP recorded. Patient has had a hysterectomy. Menarche age 5, first live birth age 41. The patient is GX P1. She had a hysterectomy with bilateral salpingo-oophorectomy in her 68s and receive hormone replacement for approximately 30 years after that.  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 Her husband died a few years ago with severe respiratory complications. He had a tracheostomy. She was his primary caregiver. She now lives alone with her femal Educational psychologist. Her son Harrell Gave lives  in [place]. He "runs the [employer]" there. The patient has one biological and 3 stepgrandchildren. She is a [employer]   ADVANCED DIRECTIVES: Not in place; at the 01/13/2014 visit the patient received the appropriate documents to declare healthcare power of attorney.   HEALTH MAINTENANCE: Social History   Tobacco Use  . Smoking status: Never Smoker  . Smokeless tobacco: Never Used  Substance Use Topics  . Alcohol use: Yes    Comment: wine occasionally-rarely  . Drug use: No     Colonoscopy:  PAP:  Bone density: 01/07/2014, normal  Lipid panel:  Allergies  Allergen Reactions  . Requip [Ropinirole Hcl] Shortness Of Breath and Nausea And Vomiting  . Minocycline Hives  . Tetracyclines & Related Hives  . Zestril [Lisinopril] Cough  . Zetia [Ezetimibe] Other (See Comments)    MYALGIA JOINT PAIN  . Amoxicillin Rash  . Codeine Other (See Comments)    fatigue  . Morphine Sulfate Other (See Comments)    Palpitations  . Sulfa Antibiotics Other (See Comments)    Headache (pt states that a blood pressure medicine caused a severe headache but doesn't remember a reaction to sulfa)    Current Outpatient Medications  Medication Sig Dispense Refill  . anastrozole (ARIMIDEX) 1 MG tablet Take 1 tablet (1 mg total) by mouth daily. 90 tablet 3  . benzonatate (TESSALON PERLES) 100 MG capsule Take 1 capsule (100 mg total) by mouth 3 (three) times daily as needed for cough (Max: 63m per day). 60 capsule 0  . buPROPion (WELLBUTRIN XL) 150 MG 24 hr tablet TAKE 1 TABLET BY MOUTH EVERY DAY IN THE MORNING 90 tablet 1  . carvedilol (COREG) 25 MG tablet TAKE 1 TABLET TWICE A DAY OR AS DIRECTED 180 tablet 1  . Cholecalciferol (VITAMIN D) 2000 UNITS tablet Take 4,000 Units by mouth daily.    . Coenzyme Q10 (COQ10) 100 MG CAPS Take 300 mg by mouth daily. 90 each 11  . cyclobenzaprine (FLEXERIL) 10 MG tablet Take 10 mg by mouth at bedtime as needed (FOR RESTLESS LEGS).   1  . docusate  sodium (COLACE) 100 MG capsule Take 1 capsule (100 mg total) by mouth 2 (two) times daily. 30 capsule 0  . DULoxetine (CYMBALTA) 30 MG capsule Take 1 capsule (30 mg total) by mouth 3 (three) times daily. (Patient taking differently: Take 90 mg by mouth every morning. ) 270 capsule 3  . ELIQUIS 5 MG TABS tablet TAKE 1 TABLET BY MOUTH TWICE A DAY 180 tablet  1  . ferrous sulfate 325 (65 FE) MG tablet TAKE 1 TABLET (325 MG TOTAL) BY MOUTH 2 (TWO) TIMES DAILY WITH A MEAL. 180 tablet 3  . furosemide (LASIX) 20 MG tablet TAKE 1 TABLET BY MOUTH TWICE A DAY 180 tablet 1  . gabapentin (NEURONTIN) 300 MG capsule TAKE ONE CAPSULE BY MOUTH EVERY DAY AT BEDTIME 90 capsule 1  . levothyroxine (SYNTHROID, LEVOTHROID) 50 MCG tablet TAKE 1/2 TO 1 TABLET BY MOUTH ON AN EMPTY STOMCH 30 TO 60 MINUTES BEFORE BREAKFAST 90 tablet 1  . meloxicam (MOBIC) 15 MG tablet TAKE 1/2 TO 1 TABLET DAILY WITH FOOD FOR PAIN *DONT TAKE WITH ALEVE OR IBUPROFEN CAN TAKE TYLENOL* 90 tablet 1  . mometasone (NASONEX) 50 MCG/ACT nasal spray USE 1 TO 2 SPRAYS IN EACH NOSTRIL TWICE DAILY 17 g 6  . ranitidine (ZANTAC) 300 MG tablet TAKE 1 TO 2 TABLETS DAILY AS NEEDED FOR ACID INDIGESTION & REFLUX 180 tablet 1  . rosuvastatin (CRESTOR) 40 MG tablet TAKE 40 MG EVERYDAY EXCEPT ON MONDAYS ,TAKE 80 MG BY MOUTH DAILY 102 tablet 3  . temazepam (RESTORIL) 30 MG capsule TAKE ONE CAPSULE BY MOUTH EVERY DAY AT BEDTIME AS NEEDED FOR SLEEP 90 capsule 1  . traMADol (ULTRAM) 50 MG tablet Take 1 tablet (50 mg total) by mouth every 12 (twelve) hours as needed. 60 tablet 0  . ipratropium (ATROVENT) 0.03 % nasal spray Place 2 sprays into the nose 3 (three) times daily. 54 mL 3  . losartan (COZAAR) 100 MG tablet Take 1 tablet (100 mg total) by mouth daily. 90 tablet 3   No current facility-administered medications for this visit.     OBJECTIVE: Middle-aged white woman appears well  Vitals:   07/13/17 1335  BP: (!) 143/61  Pulse: 79  Resp: 18  Temp: 98.6 F  (37 C)  SpO2: 100%     Body mass index is 36.44 kg/m.    ECOG FS:0 - Asymptomatic  Sclerae unicteric, pupils round and equal Oropharynx clear and moist No cervical or supraclavicular adenopathy Lungs no rales or rhonchi Heart regular rate and rhythm Abd soft, nontender, positive bowel sounds MSK no focal spinal tenderness, no upper extremity lymphedema, status post left knee replacement, with good range of motion Neuro: nonfocal, well oriented, appropriate affect Breasts: The right breast is unremarkable.  On the left she is status post lumpectomy and radiation.  There is no evidence of local recurrence.  Both axillae are benign.  LAB RESULTS:  CMP     Component Value Date/Time   NA 139 06/14/2017 1519   NA 139 07/13/2016 1005   K 4.5 06/14/2017 1519   K 3.7 07/13/2016 1005   CL 104 06/14/2017 1519   CO2 24 06/14/2017 1519   CO2 25 07/13/2016 1005   GLUCOSE 89 06/14/2017 1519   GLUCOSE 135 07/13/2016 1005   BUN 13 06/14/2017 1519   BUN 15.2 07/13/2016 1005   CREATININE 0.99 (H) 06/14/2017 1519   CREATININE 0.8 07/13/2016 1005   CALCIUM 9.3 06/14/2017 1519   CALCIUM 9.7 07/13/2016 1005   PROT 7.0 06/14/2017 1519   PROT 7.2 07/13/2016 1005   ALBUMIN 3.9 09/12/2016 1411   ALBUMIN 3.6 07/13/2016 1005   AST 20 06/14/2017 1519   AST 17 07/13/2016 1005   ALT 13 06/14/2017 1519   ALT 20 07/13/2016 1005   ALKPHOS 112 09/12/2016 1411   ALKPHOS 150 07/13/2016 1005   BILITOT 0.4 06/14/2017 1519   BILITOT 0.54 07/13/2016 1005  GFRNONAA 57 (L) 06/14/2017 1519   GFRAA 66 06/14/2017 1519    I No results found for: SPEP  Lab Results  Component Value Date   WBC 6.5 07/13/2017   NEUTROABS 4.4 07/13/2017   HGB 11.9 06/14/2017   HCT 30.5 (L) 07/13/2017   MCV 91.3 07/13/2017   PLT 245 07/13/2017      Chemistry      Component Value Date/Time   NA 139 06/14/2017 1519   NA 139 07/13/2016 1005   K 4.5 06/14/2017 1519   K 3.7 07/13/2016 1005   CL 104 06/14/2017 1519    CO2 24 06/14/2017 1519   CO2 25 07/13/2016 1005   BUN 13 06/14/2017 1519   BUN 15.2 07/13/2016 1005   CREATININE 0.99 (H) 06/14/2017 1519   CREATININE 0.8 07/13/2016 1005      Component Value Date/Time   CALCIUM 9.3 06/14/2017 1519   CALCIUM 9.7 07/13/2016 1005   ALKPHOS 112 09/12/2016 1411   ALKPHOS 150 07/13/2016 1005   AST 20 06/14/2017 1519   AST 17 07/13/2016 1005   ALT 13 06/14/2017 1519   ALT 20 07/13/2016 1005   BILITOT 0.4 06/14/2017 1519   BILITOT 0.54 07/13/2016 1005       Lab Results  Component Value Date   LABCA2 30 05/31/2013    No components found for: [id number]  No results for input(s): INR in the last 168 hours.  Urinalysis    Component Value Date/Time   COLORURINE DARK YELLOW 11/24/2016 1050   APPEARANCEUR TURBID (A) 11/24/2016 1050   LABSPEC 1.030 11/24/2016 1050   PHURINE < OR = 5.0 11/24/2016 1050   GLUCOSEU NEGATIVE 11/24/2016 1050   HGBUR NEGATIVE 11/24/2016 1050   BILIRUBINUR NEGATIVE 11/24/2016 1050   KETONESUR TRACE (A) 11/24/2016 1050   PROTEINUR 1+ (A) 11/24/2016 1050   UROBILINOGEN 0.2 06/16/2014 1523   NITRITE POSITIVE (A) 11/24/2016 1050   LEUKOCYTESUR 2+ (A) 11/24/2016 1050    STUDIES: On 06/06/2017 she underwent a bilateral diagnostic breast mammogram with tomography, breast density category B, showing no evidence of malignancy and expected post lumpectomy changes involving the left breast.   ASSESSMENT: 72 y.o. [place] woman  (1) status post left lumpectomy and sentinel lymph node sampling 06/06/2013 for a pT1c pN0, stage IA invasive lobular carcinoma, grade 1, estrogen receptor and progesterone receptor strongly positive, with no HER-2 amplification, and an MIB-1 of 14%.  (2) Oncotype DX score of 11 predicted a 10 year risk of outside the breast recurrence of 8% of the patient's only local treatment was tamoxifen for 5 years. It also predicted no benefit from chemotherapy.  (3) adjuvant radiation to the left breast  completed 08/28/2013.  (4) started anastrozole June 2015;   (a) bone density scan [name] 2015 was normal (T score positive)  (5) status post back injury requiring posterior lumbar fusion at the L4-5 level  (6) status post remote hysterectomy with bilateral salpingo-oophorectomy  PLAN: Anaeli is now over 4 years out from definitive surgery for breast cancer with no evidence of disease recurrence.  This is very favorable.  She is tolerating anastrozole well.  The plan is to continue that through May 2020 after which she will "graduate" from follow-up here.  I am not sure why she is again anemic.  She had a workup last year through her primary care physician which was unrevealing.  We will repeat some of those labs next week and I will call her with those results.  Otherwise she knows to  call for any other issues that may develop before the next visit. Marquese Burkland, Virgie Dad, MD  07/13/17 1:51 PM Medical Oncology and Hematology [facility] [address] [place], [place] [address] Tel. [phone]    Fax. [phone]  This document serves as a record of services personally performed by Chauncey Cruel, MD. It was created on his behalf by Margit Banda, a trained medical scribe. The creation of this record is based on the scribe's personal observations and the provider's statements to them.   I have reviewed the above documentation for accuracy and completeness, and I agree with the above.

## 2017-07-18 ENCOUNTER — Inpatient Hospital Stay: Payer: Medicare Other

## 2017-07-18 DIAGNOSIS — I471 Supraventricular tachycardia: Secondary | ICD-10-CM

## 2017-07-18 DIAGNOSIS — C50312 Malignant neoplasm of lower-inner quadrant of left female breast: Secondary | ICD-10-CM | POA: Diagnosis not present

## 2017-07-18 DIAGNOSIS — I5042 Chronic combined systolic (congestive) and diastolic (congestive) heart failure: Secondary | ICD-10-CM

## 2017-07-18 DIAGNOSIS — Z170 Estrogen receptor positive status [ER+]: Principal | ICD-10-CM

## 2017-07-18 LAB — RETICULOCYTES
RBC.: 3.46 MIL/uL — ABNORMAL LOW (ref 3.70–5.45)
Retic Count, Absolute: 31.1 10*3/uL — ABNORMAL LOW (ref 33.7–90.7)
Retic Ct Pct: 0.9 % (ref 0.7–2.1)

## 2017-07-18 LAB — SEDIMENTATION RATE: SED RATE: 38 mm/h — AB (ref 0–22)

## 2017-07-18 LAB — VITAMIN B12: VITAMIN B 12: 243 pg/mL (ref 180–914)

## 2017-07-18 LAB — SAVE SMEAR

## 2017-07-18 LAB — FOLATE: Folate: 17.9 ng/mL (ref >5.9)

## 2017-07-18 LAB — FERRITIN: Ferritin: 98 ng/mL (ref 9–269)

## 2017-09-05 ENCOUNTER — Ambulatory Visit: Payer: Self-pay | Admitting: Adult Health

## 2017-09-13 NOTE — Progress Notes (Signed)
MEDICARE ANNUAL WELLNESS VISIT AND FOLLOW UP  Assessment:   Essential hypertension - continue medications, DASH diet, exercise and monitor at home. Call if greater than 130/80.   -     CBC with Differential/Platelet -     BASIC METABOLIC PANEL WITH GFR -     Hepatic function panel -     TSH  Paroxysmal atrial fibrillation ([place]): CHA2DS2-VASc Score - starting Eliquis Control blood pressure, cholesterol, glucose, increase exercise.  Continue cardio follow up  Chronic combined systolic and diastolic heart failure, NYHA class 2 ([place]) -- exacerbated by A. Fib Weight stable, followed by cardiology  Non-ischemic cardiomyopathy ([place]) Weight stable Optimize meds  Paroxysmal supraventricular tachycardia ([facility]) Control blood pressure, cholesterol, glucose, increase exercise.  Continue cardio follow up  Non-seasonal allergic rhinitis, unspecified trigger - Allegra OTC, increase H20, allergy hygiene explained.  Gastroesophageal reflux disease without esophagitis Continue PPI/H2 blocker, diet discussed  Other specified hypothyroidism Hypothyroidism-check TSH level, continue medications the same, reminded to take on an empty stomach 30-68mins before food.  -     TSH  Lumbar radiculopathy Follow up ortho  DDD (degenerative disc disease), lumbar Follow up ortho  Hyperlipidemia -continue medications, check lipids, decrease fatty foods, increase activity.  -     Lipid panel  H/O left bundle branch block Continue cardio follow up  ANXIETY DEPRESSION Continue meds  History of pancreatitis Monitor, no ETOH  Malignant neoplasm of lower-inner quadrant of left breast in female, estrogen receptor positive ([place]) Continue follow up oncology, continue meds  Morbid Obesity with co morbidities - long discussion about weight loss, diet, and exercise  Prediabetes Discussed general issues about diabetes pathophysiology and management., Educational material distributed., Suggested low  cholesterol diet., Encouraged aerobic exercise., Discussed foot care., Reminded to get yearly retinal exam. -     Hemoglobin A1c  Vitamin D deficiency Continue supplementation Check vitamin D level  History of ulcer disease Monitor, avoid NSAIDS, continue GERD meds   Over 40 minutes of exam, counseling, chart review and critical decision making was performed Future Appointments  Date Time Provider [place]  11/30/2017 10:00 AM Vicie Mutters, PA-C [facility] None  08/16/2018  2:00 PM [facility] 1 [facility] None  08/16/2018  2:30 PM Magrinat, Virgie Dad, MD [facility] None     Plan:   During the course of the visit the patient was educated and counseled about appropriate screening and preventive services including:    Pneumococcal vaccine   Prevnar 13  Influenza vaccine  Td vaccine  Screening electrocardiogram  Bone densitometry screening  Colorectal cancer screening  Diabetes screening  Glaucoma screening  Nutrition counseling   Advanced directives: requested   Subjective:  [name] [name] is a 72 y.o. female who presents for Medicare Annual Wellness Visit and 3 month follow up. She has hx of a. Fib, on elequis, followed by cardiology. She has hx of R breast cancer in 2015, s/p lumpectomy, on anastrozole, followed by Dr. Jana Hakim. She follows with Dr. [name] for lower back pain.   BMI is Body mass index is 36.98 kg/m., she has not been working on diet and exercise. Wt Readings from Last 3 Encounters:  09/14/17 222 lb 3.2 oz (100.8 kg)  07/13/17 219 lb (99.3 kg)  06/14/17 214 lb (97.1 kg)    Her blood pressure has been controlled at home, today their BP is BP: 120/64 She does not workout. She denies chest pain, shortness of breath, dizziness.   She is on cholesterol medication (rosuvastatin 40 mg 6  days a week) and denies myalgias. Her cholesterol is not at goal. The cholesterol last visit was:   Lab Results  Component Value Date   CHOL 272  (H) 06/14/2017   HDL 37 (L) 06/14/2017   LDLCALC 181 (H) 06/14/2017   TRIG 317 (H) 06/14/2017   CHOLHDL 7.4 (H) 06/14/2017    She has not been working on diet and exercise for prediabetes, and denies foot ulcerations, increased appetite, nausea, paresthesia of the feet, polydipsia, polyuria, visual disturbances, vomiting and weight loss. Last A1C in the office was:  Lab Results  Component Value Date   HGBA1C 6.1 (H) 06/14/2017   Last GFR: Lab Results  Component Value Date   GFRNONAA >60 07/13/2017   Patient is on Vitamin D supplement.   Lab Results  Component Value Date   VD25OH 38 03/06/2017      Medication Review: Current Outpatient Medications on File Prior to Visit  Medication Sig Dispense Refill  . anastrozole (ARIMIDEX) 1 MG tablet Take 1 tablet (1 mg total) by mouth daily. 90 tablet 3  . buPROPion (WELLBUTRIN XL) 150 MG 24 hr tablet TAKE 1 TABLET BY MOUTH EVERY DAY IN THE MORNING 90 tablet 1  . carvedilol (COREG) 25 MG tablet TAKE 1 TABLET TWICE A DAY OR AS DIRECTED 180 tablet 1  . Cholecalciferol (VITAMIN D) 2000 UNITS tablet Take 4,000 Units by mouth daily.    . Coenzyme Q10 (COQ10) 100 MG CAPS Take 300 mg by mouth daily. 90 each 11  . cyclobenzaprine (FLEXERIL) 10 MG tablet Take 10 mg by mouth at bedtime as needed (FOR RESTLESS LEGS).   1  . docusate sodium (COLACE) 100 MG capsule Take 1 capsule (100 mg total) by mouth 2 (two) times daily. 30 capsule 0  . DULoxetine (CYMBALTA) 30 MG capsule Take 1 capsule (30 mg total) by mouth 3 (three) times daily. (Patient taking differently: Take 90 mg by mouth every morning. ) 270 capsule 3  . ELIQUIS 5 MG TABS tablet TAKE 1 TABLET BY MOUTH TWICE A DAY 180 tablet 1  . ferrous sulfate 325 (65 FE) MG tablet TAKE 1 TABLET (325 MG TOTAL) BY MOUTH 2 (TWO) TIMES DAILY WITH A MEAL. 180 tablet 3  . furosemide (LASIX) 20 MG tablet TAKE 1 TABLET BY MOUTH TWICE A DAY 180 tablet 1  . gabapentin (NEURONTIN) 300 MG capsule TAKE ONE CAPSULE BY  MOUTH EVERY DAY AT BEDTIME 90 capsule 1  . ipratropium (ATROVENT) 0.03 % nasal spray Place 2 sprays into the nose 3 (three) times daily. 54 mL 3  . levothyroxine (SYNTHROID, LEVOTHROID) 50 MCG tablet TAKE 1/2 TO 1 TABLET BY MOUTH ON AN EMPTY STOMCH 30 TO 60 MINUTES BEFORE BREAKFAST 90 tablet 1  . losartan (COZAAR) 100 MG tablet Take 1 tablet (100 mg total) by mouth daily. 90 tablet 3  . meloxicam (MOBIC) 15 MG tablet TAKE 1/2 TO 1 TABLET DAILY WITH FOOD FOR PAIN *DONT TAKE WITH ALEVE OR IBUPROFEN CAN TAKE TYLENOL* 90 tablet 1  . mometasone (NASONEX) 50 MCG/ACT nasal spray USE 1 TO 2 SPRAYS IN EACH NOSTRIL TWICE DAILY 17 g 6  . ranitidine (ZANTAC) 300 MG tablet TAKE 1 TO 2 TABLETS DAILY AS NEEDED FOR ACID INDIGESTION & REFLUX 180 tablet 1  . rosuvastatin (CRESTOR) 40 MG tablet TAKE 40 MG EVERYDAY EXCEPT ON MONDAYS ,TAKE 80 MG BY MOUTH DAILY 102 tablet 3  . temazepam (RESTORIL) 30 MG capsule TAKE ONE CAPSULE BY MOUTH EVERY DAY AT BEDTIME AS  NEEDED FOR SLEEP 90 capsule 1  . traMADol (ULTRAM) 50 MG tablet Take 1 tablet (50 mg total) by mouth every 12 (twelve) hours as needed. 60 tablet 0  . benzonatate (TESSALON PERLES) 100 MG capsule Take 1 capsule (100 mg total) by mouth 3 (three) times daily as needed for cough (Max: 600mg  per day). (Patient not taking: Reported on 09/14/2017) 60 capsule 0   No current facility-administered medications on file prior to visit.     Allergies  Allergen Reactions  . Requip [Ropinirole Hcl] Shortness Of Breath and Nausea And Vomiting  . Minocycline Hives  . Tetracyclines & Related Hives  . Zestril [Lisinopril] Cough  . Zetia [Ezetimibe] Other (See Comments)    MYALGIA JOINT PAIN  . Amoxicillin Rash  . Codeine Other (See Comments)    fatigue  . Morphine Sulfate Other (See Comments)    Palpitations  . Sulfa Antibiotics Other (See Comments)    Headache (pt states that a blood pressure medicine caused a severe headache but doesn't remember a reaction to sulfa)     Current Problems (verified) Patient Active Problem List   Diagnosis Date Noted  . Morbid obesity ([place]) 06/14/2017  . Primary osteoarthritis of left knee 09/23/2016  . Non-ischemic cardiomyopathy ([place])   . Chronic combined systolic and diastolic heart failure, NYHA class 2 ([place]) -- exacerbated by A. fib 08/08/2015  . Paroxysmal atrial fibrillation [facility]): CHA2DS2-VASc Score - starting Eliquis 08/06/2015  . Hypothyroidism 08/06/2015  . History of ulcer disease 08/06/2015  . Absolute anemia   . Encounter for Medicare annual wellness exam 03/31/2015  . DDD (degenerative disc disease), lumbar 06/16/2014  . Prediabetes 12/18/2013  . Vitamin D deficiency 12/18/2013  . Medication management 12/18/2013  . Paroxysmal supraventricular tachycardia ([place]) 10/18/2013  . Radiculopathy 10/16/2013  . Malignant neoplasm of lower-inner quadrant of left breast in female, estrogen receptor positive ([place]) 05/20/2013  . History of pancreatitis 03/27/2008  . Hyperlipidemia 03/13/2008  . Depression, major, recurrent, in partial remission ([place]) 03/13/2008  . Essential hypertension 03/13/2008  . H/O left bundle branch block 03/13/2008  . Allergic rhinitis 03/13/2008  . GERD 03/13/2008    Screening Tests Immunization History  Administered Date(s) Administered  . Influenza Whole 01/01/2013  . Influenza, High Dose Seasonal PF 01/29/2014, 12/30/2014  . Influenza-Unspecified 12/11/2015, 02/04/2017  . Pneumococcal Conjugate-13 06/16/2014  . Pneumococcal Polysaccharide-23 05/23/2011  . Tdap 03/11/2008  . Zoster 02/02/2010   Preventative care: Last colonoscopy: 2014 Last mammogram: 05/2017 (05/06/2013 + right breast cancer s/p lumpectomy) Last pap smear/pelvic exam: remote   DEXA: 12/2013- normal Renal US 08/2015 Echo 2008  Prior vaccinations: TD or Tdap: 2009 due after 03/2018 Influenza: 2018 Pneumococcal: 2013 Prevnar 13: 2016 Shingles/Zostavax: 2011  Names of Other Physician/Practitioners  you currently use: 1. [facility] here for primary care 2. [facility], eye doctor, last visit April 2015 3. [facility], dentist, last visit 2019, q 6 months  Patient Care Team: Unk Pinto, MD as PCP - General (Internal Medicine) Magrinat, Virgie Dad, MD as Consulting Physician (Oncology) Leonie Man, MD as Consulting Physician (Cardiology) Dorna Leitz, MD as Consulting Physician (Orthopedic Surgery) Rolm Bookbinder, MD as Consulting Physician (General Surgery) Eppie Gibson, MD as Attending Physician (Radiation Oncology)  SURGICAL HISTORY She  has a past surgical history that includes Cataract extraction bilateral w/ anterior vitrectomy (Bilateral); Abdominal hysterectomy; Cholecystectomy; Knee arthroscopy (Bilateral); Tonsillectomy; Colonoscopy (2014); Upper gi endoscopy; Dupuytren / palmar fasciotomy (Bilateral, 2007); Tubal ligation; epidural injections; Lumbar  fusion (10/2013); Cardiac catheterization (N/A, 08/10/2015); transthoracic echocardiogram (08/07/2015); transthoracic echocardiogram (10/2015); Joint replacement; Total knee arthroplasty (Left, 09/23/2016); Breast lumpectomy (Left); Foot Tendon Surgery (Right, ~ 11/2015); and Total knee arthroplasty (Left, 09/23/2016). FAMILY HISTORY Her family history includes COPD in her father; Heart attack in her mother; Hypertension in her father; Stroke in her mother. SOCIAL HISTORY She  reports that she has never smoked. She has never used smokeless tobacco. She reports that she drinks alcohol. She reports that she does not use drugs.   MEDICARE WELLNESS OBJECTIVES: Physical activity: Current Exercise Habits: The patient does not participate in regular exercise at present, Exercise limited by: orthopedic condition(s) Cardiac risk factors: Cardiac Risk Factors include: advanced age (>9men, >55 women);dyslipidemia;hypertension;obesity (BMI >30kg/m2);sedentary  lifestyle Depression/mood screen:   Depression screen [facility] 2/9 09/14/2017  Decreased Interest 0  Down, Depressed, Hopeless 0  PHQ - 2 Score 0  Some recent data might be hidden    ADLs:  In your present state of health, do you have any difficulty performing the following activities: 09/14/2017 03/06/2017  Hearing? N N  Comment - -  Vision? N N  Difficulty concentrating or making decisions? N N  Comment - -  Walking or climbing stairs? N N  Comment Back pain -  Dressing or bathing? N N  Doing errands, shopping? N N  Preparing Food and eating ? N -  Using the Toilet? N -  In the past six months, have you accidently leaked urine? N -  Do you have problems with loss of bowel control? N -  Managing your Medications? N -  Managing your Finances? N -  Housekeeping or managing your Housekeeping? N -  Some recent data might be hidden     Cognitive Testing  Alert? Yes  Normal Appearance?Yes  Oriented to person? Yes  Place? Yes   Time? Yes  Recall of three objects?  Yes  Can perform simple calculations? Yes  Displays appropriate judgment?Yes  Can read the correct time from a watch face?Yes  EOL planning: Does Patient Have a Medical Advance Directive?: No Would patient like information on creating a medical advance directive?: No - Patient declined  Review of Systems  Constitutional: Negative for malaise/fatigue and weight loss.  HENT: Negative for hearing loss and tinnitus.   Eyes: Negative for blurred vision and double vision.  Respiratory: Negative for cough, sputum production, shortness of breath and wheezing.   Cardiovascular: Negative for chest pain, palpitations, orthopnea, claudication, leg swelling and PND.  Gastrointestinal: Negative for abdominal pain, blood in stool, constipation, diarrhea, heartburn, melena, nausea and vomiting.  Genitourinary: Negative.   Musculoskeletal: Negative for falls, joint pain and myalgias.  Skin: Negative for rash.  Neurological: Negative for  dizziness, tingling, sensory change, weakness and headaches.  Endo/Heme/Allergies: Negative for polydipsia.  Psychiatric/Behavioral: Negative.  Negative for depression, memory loss, substance abuse and suicidal ideas. The patient is not nervous/anxious and does not have insomnia.   All other systems reviewed and are negative.    Objective:     Today's Vitals   09/14/17 1448  BP: 120/64  Pulse: 70  Temp: (!) 97.5 F (36.4 C)  SpO2: 97%  Weight: 222 lb 3.2 oz (100.8 kg)  Height: 5\' 5"  (1.651 m)  PainSc: 8   PainLoc: Back   Body mass index is 36.98 kg/m.  General appearance: alert, no distress, WD/WN, female HEENT: normocephalic, sclerae anicteric, TMs pearly, nares patent, no discharge or erythema, pharynx normal Oral cavity: MMM, no lesions Neck: supple, no  lymphadenopathy, no thyromegaly, no masses Heart: RRR, normal S1, S2, no murmurs Lungs: CTA bilaterally, no wheezes, rhonchi, or rales Abdomen: +bs, soft, non tender, non distended, no masses, no hepatomegaly, no splenomegaly Musculoskeletal: nontender, no swelling, no obvious deformity Extremities: no edema, no cyanosis, no clubbing Pulses: 2+ symmetric, upper and lower extremities, normal cap refill Neurological: alert, oriented x 3, CN2-12 intact, strength normal upper extremities and lower extremities, sensation normal throughout, DTRs 2+ throughout, no cerebellar signs, gait normal Psychiatric: normal affect, behavior normal, pleasant   Medicare Attestation I have personally reviewed: The patient's medical and social history Their use of alcohol, tobacco or illicit drugs Their current medications and supplements The patient's functional ability including ADLs,fall risks, home safety risks, cognitive, and hearing and visual impairment Diet and physical activities Evidence for depression or mood disorders  The patient's weight, height, BMI, and visual acuity have been recorded in the chart.  I have made referrals,  counseling, and provided education to the patient based on review of the above and I have provided the patient with a written personalized care plan for preventive services.     Izora Ribas, NP   09/14/2017

## 2017-09-14 ENCOUNTER — Other Ambulatory Visit: Payer: Self-pay

## 2017-09-14 ENCOUNTER — Ambulatory Visit: Payer: Medicare Other | Admitting: Adult Health

## 2017-09-14 ENCOUNTER — Encounter: Payer: Self-pay | Admitting: Adult Health

## 2017-09-14 VITALS — BP 120/64 | HR 70 | Temp 97.5°F | Ht 65.0 in | Wt 222.2 lb

## 2017-09-14 DIAGNOSIS — Z0001 Encounter for general adult medical examination with abnormal findings: Secondary | ICD-10-CM

## 2017-09-14 DIAGNOSIS — I428 Other cardiomyopathies: Secondary | ICD-10-CM | POA: Diagnosis not present

## 2017-09-14 DIAGNOSIS — Z87898 Personal history of other specified conditions: Secondary | ICD-10-CM

## 2017-09-14 DIAGNOSIS — I5042 Chronic combined systolic (congestive) and diastolic (congestive) heart failure: Secondary | ICD-10-CM

## 2017-09-14 DIAGNOSIS — I10 Essential (primary) hypertension: Secondary | ICD-10-CM | POA: Diagnosis not present

## 2017-09-14 DIAGNOSIS — K219 Gastro-esophageal reflux disease without esophagitis: Secondary | ICD-10-CM | POA: Diagnosis not present

## 2017-09-14 DIAGNOSIS — E782 Mixed hyperlipidemia: Secondary | ICD-10-CM

## 2017-09-14 DIAGNOSIS — I480 Paroxysmal atrial fibrillation: Secondary | ICD-10-CM | POA: Diagnosis not present

## 2017-09-14 DIAGNOSIS — Z79899 Other long term (current) drug therapy: Secondary | ICD-10-CM

## 2017-09-14 DIAGNOSIS — R6889 Other general symptoms and signs: Secondary | ICD-10-CM | POA: Diagnosis not present

## 2017-09-14 DIAGNOSIS — J3089 Other allergic rhinitis: Secondary | ICD-10-CM

## 2017-09-14 DIAGNOSIS — M1712 Unilateral primary osteoarthritis, left knee: Secondary | ICD-10-CM

## 2017-09-14 DIAGNOSIS — E039 Hypothyroidism, unspecified: Secondary | ICD-10-CM

## 2017-09-14 DIAGNOSIS — Z8719 Personal history of other diseases of the digestive system: Secondary | ICD-10-CM

## 2017-09-14 DIAGNOSIS — I471 Supraventricular tachycardia: Secondary | ICD-10-CM

## 2017-09-14 DIAGNOSIS — F3341 Major depressive disorder, recurrent, in partial remission: Secondary | ICD-10-CM

## 2017-09-14 DIAGNOSIS — E559 Vitamin D deficiency, unspecified: Secondary | ICD-10-CM | POA: Diagnosis not present

## 2017-09-14 DIAGNOSIS — Z8679 Personal history of other diseases of the circulatory system: Secondary | ICD-10-CM

## 2017-09-14 DIAGNOSIS — M5136 Other intervertebral disc degeneration, lumbar region: Secondary | ICD-10-CM | POA: Diagnosis not present

## 2017-09-14 DIAGNOSIS — D649 Anemia, unspecified: Secondary | ICD-10-CM

## 2017-09-14 DIAGNOSIS — M5416 Radiculopathy, lumbar region: Secondary | ICD-10-CM

## 2017-09-14 DIAGNOSIS — Z170 Estrogen receptor positive status [ER+]: Secondary | ICD-10-CM

## 2017-09-14 DIAGNOSIS — C50312 Malignant neoplasm of lower-inner quadrant of left female breast: Secondary | ICD-10-CM

## 2017-09-14 DIAGNOSIS — R7303 Prediabetes: Secondary | ICD-10-CM

## 2017-09-14 DIAGNOSIS — Z0000 Encounter for general adult medical examination without abnormal findings: Secondary | ICD-10-CM

## 2017-09-14 NOTE — Patient Instructions (Addendum)
[employer] with no obligation # [phone] Do not have to be a member Tues-Sat 10-6  [place]- free test with no obligation # [phone] MUST BE A MEMBER Call for store hours  Have had patient's get good cheaper hearing aids from [employer] The air version has good reviews.       Aim to maintain a healthy weight (BMI <25)  Aim for 7+ servings of fruits and vegetables daily  80+ fluid ounces of water or unsweet tea for healthy kidneys  Limit alcohol intake, avoid smoking  Limit animal fats in diet for cholesterol and heart health - choose grass fed whenever available  Aim for low stress - take time to unwind and care for your mental health  Aim for 150 min of moderate intensity exercise weekly for heart health, and weights twice weekly for bone health  Aim for 7-9 hours of sleep daily      When it comes to diets, agreement about the perfect plan isn't easy to find, even among the experts. Experts at the [employer] developed an idea known as the Healthy Eating Plate. Just imagine a plate divided into logical, healthy portions.  The emphasis is on diet quality:  Load up on vegetables and fruits - one-half of your plate: Aim for color and variety, and remember that potatoes don't count.  Go for whole grains - one-quarter of your plate: Whole wheat, barley, wheat berries, quinoa, oats, brown rice, and foods made with them. If you want pasta, go with whole wheat pasta.  Protein power - one-quarter of your plate: Fish, chicken, beans, and nuts are all healthy, versatile protein sources. Limit red meat.  The diet, however, does go beyond the plate, offering a few other suggestions.  Use healthy plant oils, such as olive, canola, soy, corn, sunflower and peanut. Check the labels, and avoid partially hydrogenated oil, which have unhealthy trans fats.  If you're thirsty, drink water. Coffee and tea are good in  moderation, but skip sugary drinks and limit milk and dairy products to one or two daily servings.  The type of carbohydrate in the diet is more important than the amount. Some sources of carbohydrates, such as vegetables, fruits, whole grains, and beans-are healthier than others.  Finally, stay active.

## 2017-09-14 NOTE — Telephone Encounter (Signed)
Temazepam refill request. PMP checked, last filled on 06/22/17, #90 for 90 days. Last prescribing doctor was Dr. Melford Aase.

## 2017-09-15 LAB — COMPLETE METABOLIC PANEL WITH GFR
AG Ratio: 1.8 (calc) (ref 1.0–2.5)
ALKALINE PHOSPHATASE (APISO): 78 U/L (ref 33–130)
ALT: 11 U/L (ref 6–29)
AST: 14 U/L (ref 10–35)
Albumin: 4.1 g/dL (ref 3.6–5.1)
BILIRUBIN TOTAL: 0.3 mg/dL (ref 0.2–1.2)
BUN: 23 mg/dL (ref 7–25)
CHLORIDE: 103 mmol/L (ref 98–110)
CO2: 27 mmol/L (ref 20–32)
Calcium: 9.2 mg/dL (ref 8.6–10.4)
Creat: 0.89 mg/dL (ref 0.60–0.93)
GFR, Est African American: 76 mL/min/{1.73_m2} (ref >=60)
GFR, Est Non African American: 65 mL/min/{1.73_m2} (ref >=60)
GLUCOSE: 84 mg/dL (ref 65–99)
Globulin: 2.3 g/dL (calc) (ref 1.9–3.7)
Potassium: 4 mmol/L (ref 3.5–5.3)
Sodium: 139 mmol/L (ref 135–146)
Total Protein: 6.4 g/dL (ref 6.1–8.1)

## 2017-09-15 LAB — CBC WITH DIFFERENTIAL/PLATELET
BASOS ABS: 61 {cells}/uL (ref 0–200)
Basophils Relative: 0.8 %
EOS ABS: 122 {cells}/uL (ref 15–500)
EOS PCT: 1.6 %
HCT: 28.4 % — ABNORMAL LOW (ref 35.0–45.0)
Hemoglobin: 9.7 g/dL — ABNORMAL LOW (ref 11.7–15.5)
Lymphs Abs: 1467 cells/uL (ref 850–3900)
MCH: 30.4 pg (ref 27.0–33.0)
MCHC: 34.2 g/dL (ref 32.0–36.0)
MCV: 89 fL (ref 80.0–100.0)
MPV: 10.9 fL (ref 7.5–12.5)
Monocytes Relative: 8.7 %
Neutro Abs: 5290 cells/uL (ref 1500–7800)
Neutrophils Relative %: 69.6 %
PLATELETS: 237 10*3/uL (ref 140–400)
RBC: 3.19 10*6/uL — ABNORMAL LOW (ref 3.80–5.10)
RDW: 13 % (ref 11.0–15.0)
TOTAL LYMPHOCYTE: 19.3 %
WBC mixed population: 661 cells/uL (ref 200–950)
WBC: 7.6 10*3/uL (ref 3.8–10.8)

## 2017-09-15 LAB — LIPID PANEL
CHOLESTEROL: 145 mg/dL (ref <200)
HDL: 47 mg/dL — AB (ref >50)
LDL Cholesterol (Calc): 73 mg/dL (calc)
Non-HDL Cholesterol (Calc): 98 mg/dL (calc) (ref <130)
TRIGLYCERIDES: 172 mg/dL — AB (ref <150)
Total CHOL/HDL Ratio: 3.1 (calc) (ref <5.0)

## 2017-09-15 LAB — HEMOGLOBIN A1C
EAG (MMOL/L): 7 (calc)
Hgb A1c MFr Bld: 6 % of total Hgb — ABNORMAL HIGH (ref <5.7)
Mean Plasma Glucose: 126 (calc)

## 2017-09-15 LAB — MAGNESIUM: MAGNESIUM: 2.2 mg/dL (ref 1.5–2.5)

## 2017-09-15 LAB — TSH: TSH: 1.15 m[IU]/L (ref 0.40–4.50)

## 2017-09-15 LAB — VITAMIN D 25 HYDROXY (VIT D DEFICIENCY, FRACTURES): VIT D 25 HYDROXY: 90 ng/mL (ref 30–100)

## 2017-09-15 MED ORDER — TEMAZEPAM 30 MG PO CAPS: ORAL_CAPSULE | ORAL | 1 refills | Status: DC

## 2017-09-19 ENCOUNTER — Other Ambulatory Visit: Payer: Self-pay | Admitting: Oncology

## 2017-09-19 DIAGNOSIS — Z170 Estrogen receptor positive status [ER+]: Principal | ICD-10-CM

## 2017-09-19 DIAGNOSIS — C50312 Malignant neoplasm of lower-inner quadrant of left female breast: Secondary | ICD-10-CM

## 2017-09-19 DIAGNOSIS — D649 Anemia, unspecified: Secondary | ICD-10-CM

## 2017-09-19 DIAGNOSIS — D6489 Other specified anemias: Secondary | ICD-10-CM

## 2017-09-20 ENCOUNTER — Telehealth: Payer: Self-pay | Admitting: Oncology

## 2017-09-20 NOTE — Telephone Encounter (Signed)
Tried to reach patient regarding 6/17

## 2017-09-21 ENCOUNTER — Telehealth: Payer: Self-pay | Admitting: Oncology

## 2017-09-21 NOTE — Telephone Encounter (Signed)
Returned call to patient and asked her to call back if she needed to reschedule appt for 6/17 lab. Per 6/12 phone message

## 2017-09-22 ENCOUNTER — Inpatient Hospital Stay: Payer: Medicare Other | Attending: Oncology

## 2017-09-22 DIAGNOSIS — D6489 Other specified anemias: Secondary | ICD-10-CM

## 2017-09-22 DIAGNOSIS — Z170 Estrogen receptor positive status [ER+]: Secondary | ICD-10-CM

## 2017-09-22 DIAGNOSIS — C50312 Malignant neoplasm of lower-inner quadrant of left female breast: Secondary | ICD-10-CM | POA: Insufficient documentation

## 2017-09-22 LAB — RETICULOCYTES
RBC.: 3.3 MIL/uL — ABNORMAL LOW (ref 3.70–5.45)
RETIC COUNT ABSOLUTE: 46.2 10*3/uL (ref 33.7–90.7)
Retic Ct Pct: 1.4 % (ref 0.7–2.1)

## 2017-09-22 LAB — SEDIMENTATION RATE: SED RATE: 37 mm/h — AB (ref 0–22)

## 2017-09-22 LAB — C-REACTIVE PROTEIN

## 2017-09-22 LAB — SAVE SMEAR

## 2017-09-25 ENCOUNTER — Other Ambulatory Visit: Payer: Self-pay | Admitting: Oncology

## 2017-09-25 ENCOUNTER — Other Ambulatory Visit: Payer: Medicare Other

## 2017-09-26 LAB — ANTINUCLEAR ANTIBODIES, IFA: ANA Ab, IFA: POSITIVE — AB

## 2017-09-26 LAB — FANA STAINING PATTERNS: Homogeneous Pattern: 1:80 {titer}

## 2017-10-22 ENCOUNTER — Other Ambulatory Visit: Payer: Self-pay | Admitting: Internal Medicine

## 2017-10-22 ENCOUNTER — Other Ambulatory Visit: Payer: Self-pay | Admitting: Physician Assistant

## 2017-11-10 ENCOUNTER — Other Ambulatory Visit: Payer: Self-pay | Admitting: Internal Medicine

## 2017-11-11 ENCOUNTER — Other Ambulatory Visit: Payer: Self-pay | Admitting: Physician Assistant

## 2017-11-11 DIAGNOSIS — I428 Other cardiomyopathies: Secondary | ICD-10-CM

## 2017-11-11 DIAGNOSIS — I480 Paroxysmal atrial fibrillation: Secondary | ICD-10-CM

## 2017-11-11 DIAGNOSIS — I5042 Chronic combined systolic (congestive) and diastolic (congestive) heart failure: Secondary | ICD-10-CM

## 2017-11-11 DIAGNOSIS — I10 Essential (primary) hypertension: Secondary | ICD-10-CM

## 2017-11-15 NOTE — Therapy (Signed)
[place], [place], [address] Phone: [phone]   Fax:  [phone]  Physical Therapy Treatment  Patient Details  Name: [name] [name] MRN: [id number] Date of Birth: 12-06-1945 No data recorded  Encounter Date: 05/12/2016    Past Medical History:  Diagnosis Date  . Allergy    Nasonex daily as needed  . Anemia   . Anxiety   . Arthritis   . 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 Chronic back pain    cyst sitting on L4-5;slipped disc  . Depression    takes Cymbalta daily  . Diverticulosis   . Dysrhythmia   . GERD (gastroesophageal reflux disease)    takes Omeprazole daily  . Headache(784.0)    rare  . History of gastric ulcer at age 72  . History of shingles   . Hyperlipidemia   . Hypertension    takes Cardura nightly and Verapamil daily  . Hypothyroidism   . Insomnia    takes Restoril nightly  . Joint pain   . Nonischemic cardiomyopathy ([place]) 07/2015   Normal coronary arteries by cath. EF was 25-30% by echo. GR2 DD - likely related to LBBB in setting of A. fib  . Obesity (BMI 30-39.9) 06/11/2013  . Paroxysmal atrial fibrillation ([place]): CHA2DS2-VASc Score - starting Eliquis 08/06/2015   This patients CHA2DS2-VASc Score and unadjusted Ischemic Stroke Rate (% per year) is equal to 4.8 % stroke rate/year from a score of 4  Above score calculated as 1 point each if present [CHF, HTN, DM, Vascular=MI/PAD/Aortic Plaque, Age if 65-74, or Female]; 2 points each if present [Age > 75, or Stroke/TIA/TE]  . PONV (postoperative nausea and vomiting)   . S/P radiation therapy 07/18/2013-08/28/2013   1) Left Breast / 50 Gy in 25 fractions/ 2) Left Breast Boost / 10 Gy in 5 fractions  . Shingles   . Sinus drainage    put on Levaquin yesterday if no better in 3 days will start prednisone  . Thyroid disease   . Weakness    tingling and numbness both hands and  left leg  . Wears glasses     Past Surgical History:  Procedure Laterality Date  . ABDOMINAL HYSTERECTOMY    . BREAST LUMPECTOMY Left    neg  . CARDIAC CATHETERIZATION N/A 08/10/2015   Procedure: Right/Left Heart Cath and Coronary Angiography;  Surgeon: Burnell Blanks, MD;  Location: [facility];  Service: Cardiovascular;: Nonobstructive CAD  . CATARACT EXTRACTION BILATERAL W/ ANTERIOR VITRECTOMY Bilateral   . CHOLECYSTECTOMY    . COLONOSCOPY  2014  . DUPUYTREN / PALMAR FASCIOTOMY Bilateral 2007   x 3 to left and once to the right   . epidural injections     x 3  . FOOT TENDON SURGERY Right ~ 11/2015   "stepped in a hole and tore 2 tendons"  . JOINT REPLACEMENT    . KNEE ARTHROSCOPY Bilateral   . LUMBAR FUSION  10/2013   Dr. Lynann Bologna  . TONSILLECTOMY    . TOTAL KNEE ARTHROPLASTY Left 09/23/2016  . TOTAL KNEE ARTHROPLASTY Left 09/23/2016   Procedure: TOTAL KNEE ARTHROPLASTY;  Surgeon: Dorna Leitz, MD;  Location: [facility];  Service: Orthopedics;  Laterality: Left;  . TRANSTHORACIC ECHOCARDIOGRAM  08/07/2015   Severely reduced LVEF at 25-30% with diffuse HK. GR 2 DD. Ventricular dyssynergy secondary to LBBB. Small to moderate pericardial effusion but no hemodynamic compromise  .  TRANSTHORACIC ECHOCARDIOGRAM  10/2015    (EF up from 25-30%): - Left ventricle: Poor acoustic windows -  difficult to see.    EF estimated at 45-50% with inferoseptal and possible posterior hypokinesis. GR 1 DD. No pericardial effusion.  . TUBAL LIGATION    . UPPER GI ENDOSCOPY      There were no vitals filed for this visit.                                    [facility] Goals - 05/12/16 1732      CC Long Term Goal  #1   Title  Patient will report a decrease in pain by 50% so they can perform daily activities with greater ease    Time  4    Period  Weeks    Status  New      CC Long Term Goal  #2   Title  Patient will decrease the DASH score to <57     to demonstrate increased functional use of upper extremity    Baseline  63.64    Time  4    Period  Weeks    Status  New      CC Long Term Goal  #3   Title  Patient will be know how to obtain and use compression garments for maintenance phase of treatment    Time  4    Period  Weeks    Status  New      CC Long Term Goal  #4   Title  Patient will be independent in a home exercise program    Time  4    Period  Weeks    Status  New           Patient will benefit from skilled therapeutic intervention in order to improve the following deficits and impairments:  Obesity, Increased edema, Decreased knowledge of use of DME, Decreased knowledge of precautions, Impaired UE functional use, Pain, Decreased strength, Decreased range of motion, Postural dysfunction  Visit Diagnosis: Pain in joint of left shoulder region - Plan: PT plan of care cert/re-cert  Stiffness of left shoulder, not elsewhere classified - Plan: PT plan of care cert/re-cert  Lymphedema, not elsewhere classified - Plan: PT plan of care cert/re-cert     Problem List Patient Active Problem List   Diagnosis Date Noted  . Anemia 09/19/2017  . Morbid obesity ([place]) 06/14/2017  . Primary osteoarthritis of left knee 09/23/2016  . Non-ischemic cardiomyopathy ([place])   . Chronic combined systolic and diastolic heart failure, NYHA class 2 ([place]) -- exacerbated by A. fib 08/08/2015  . Paroxysmal atrial fibrillation [facility]): CHA2DS2-VASc Score - starting Eliquis 08/06/2015  . Hypothyroidism 08/06/2015  . History of ulcer disease 08/06/2015  . Absolute anemia   . Encounter for Medicare annual wellness exam 03/31/2015  . DDD (degenerative disc disease), lumbar 06/16/2014  . Prediabetes 12/18/2013  . Vitamin D deficiency 12/18/2013  . Medication management 12/18/2013  . Paroxysmal supraventricular tachycardia ([place]) 10/18/2013  . Radiculopathy 10/16/2013  . Malignant neoplasm of lower-inner quadrant of left breast in female,  estrogen receptor positive ([place]) 05/20/2013  . History of pancreatitis 03/27/2008  . Hyperlipidemia 03/13/2008  . Depression, major, recurrent, in partial remission ([place]) 03/13/2008  . Essential hypertension 03/13/2008  . H/O left bundle branch block 03/13/2008  . Allergic rhinitis 03/13/2008  . GERD 03/13/2008    Norwood Levo  11/15/2017, 11:58 AM  [place], [place], [address] Phone: [phone]   Fax:  [phone]  Name: WYNONNA FITZHENRY MRN: [id number] Date of Birth: [birth year] PHYSICAL THERAPY DISCHARGE SUMMARY  Visits from Start of Care: 1  Current functional level related to goals / functional outcomes: unknown   Remaining deficits: unknown   Education / Equipment: As above Plan: Patient agrees to discharge.  Patient goals were not met. Patient is being discharged due to not returning since the last visit.  ?????    Maudry Diego, PT 11/15/17 11:59 AM

## 2017-11-30 ENCOUNTER — Encounter: Payer: Self-pay | Admitting: Physician Assistant

## 2017-12-09 ENCOUNTER — Other Ambulatory Visit: Payer: Self-pay | Admitting: Internal Medicine

## 2017-12-13 ENCOUNTER — Other Ambulatory Visit: Payer: Self-pay | Admitting: Adult Health

## 2017-12-13 ENCOUNTER — Other Ambulatory Visit: Payer: Self-pay | Admitting: Internal Medicine

## 2017-12-13 ENCOUNTER — Other Ambulatory Visit: Payer: Self-pay | Admitting: Physician Assistant

## 2017-12-13 MED ORDER — DULOXETINE HCL 30 MG PO CPEP: 30.0000 mg | ORAL_CAPSULE | Freq: Three times a day (TID) | ORAL | 3 refills | Status: DC

## 2017-12-18 ENCOUNTER — Other Ambulatory Visit: Payer: Self-pay | Admitting: *Deleted

## 2017-12-18 MED ORDER — RANITIDINE HCL 300 MG PO TABS: ORAL_TABLET | ORAL | 1 refills | Status: DC

## 2018-01-10 ENCOUNTER — Encounter: Payer: Self-pay | Admitting: Physician Assistant

## 2018-01-14 NOTE — Progress Notes (Signed)
CPE AND FOLLOW UP  Assessment:   Essential hypertension - continue medications, DASH diet, exercise and monitor at home. Call if greater than 130/80.   -     CBC with Differential/Platelet -     BASIC METABOLIC PANEL WITH GFR -     Hepatic function panel -     TSH  Paroxysmal atrial fibrillation ([place]): CHA2DS2-VASc Score - starting Eliquis Control blood pressure, cholesterol, glucose, increase exercise.  Continue cardio follow up  Chronic combined systolic and diastolic heart failure, NYHA class 2 ([place]) -- exacerbated by A. Fib Weight stable, followed by cardiology  Non-ischemic cardiomyopathy ([place]) Weight stable Optimize meds  Paroxysmal supraventricular tachycardia ([facility]) Control blood pressure, cholesterol, glucose, increase exercise.  Continue cardio follow up  Non-seasonal allergic rhinitis, unspecified trigger - Allegra OTC, increase H20, allergy hygiene explained.  Gastroesophageal reflux disease without esophagitis Continue PPI/H2 blocker, diet discussed  Other specified hypothyroidism Hypothyroidism-check TSH level, continue medications the same, reminded to take on an empty stomach 30-61mins before food.  -     TSH  Lumbar radiculopathy Follow up ortho  DDD (degenerative disc disease), lumbar Follow up ortho  Hyperlipidemia -continue medications, check lipids, decrease fatty foods, increase activity.  -     Lipid panel  H/O left bundle branch block Continue cardio follow up  ANXIETY DEPRESSION Continue meds  History of pancreatitis Monitor, no ETOH  Malignant neoplasm of lower-inner quadrant of left breast in female, estrogen receptor positive ([place]) Continue follow up oncology, continue meds  Morbid Obesity with co morbidities - long discussion about weight loss, diet, and exercise  Prediabetes Discussed general issues about diabetes pathophysiology and management., Educational material distributed., Suggested low cholesterol diet., Encouraged  aerobic exercise., Discussed foot care., Reminded to get yearly retinal exam. -     Hemoglobin A1c  Vitamin D deficiency Continue supplementation Check vitamin D level  History of ulcer disease Monitor, avoid NSAIDS, continue GERD meds   Over 40 minutes of exam, counseling, chart review and critical decision making was performed Future Appointments  Date Time Provider [place]  08/16/2018  2:00 PM [facility] 1 [facility] None  08/16/2018  2:30 PM Magrinat, Virgie Dad, MD [facility] None  09/24/2018  3:00 PM Liane Comber, NP [facility] None  01/17/2019  2:00 PM Vicie Mutters, PA-C [facility] None       Subjective:  [name] [name] is a 72 y.o. female who presents for CPE and 3 month follow up.   She has hx of a. Fib, on elequis, followed by cardiology.  She has hx of R breast cancer in 2015, s/p lumpectomy, on anastrozole, followed by Dr. Jana Hakim.  She follows with Dr. [name] for lower back pain and is seeing Dr. Mina Marble for her neck has had two injections and is now on gabapentin that is helping some.   BMI is Body mass index is 38.13 kg/m., she has not been working on diet and exercise. Wt Readings from Last 3 Encounters:  01/16/18 225 lb 9.6 oz (102.3 kg)  09/14/17 222 lb 3.2 oz (100.8 kg)  07/13/17 219 lb (99.3 kg)    Her blood pressure has been controlled at home, today their BP is BP: 120/78 She does not workout. She denies chest pain, shortness of breath, dizziness.   She is on cholesterol medication (rosuvastatin 40 mg 6 days a week) and denies myalgias. Her cholesterol is not at goal. The cholesterol last visit was:   Lab Results  Component Value Date   CHOL 145 09/14/2017  HDL 47 (L) 09/14/2017   LDLCALC 73 09/14/2017   TRIG 172 (H) 09/14/2017   CHOLHDL 3.1 09/14/2017    She has not been working on diet and exercise for prediabetes, and denies foot ulcerations, increased appetite, nausea, paresthesia of the feet, polydipsia, polyuria, visual  disturbances, vomiting and weight loss. Last A1C in the office was:  Lab Results  Component Value Date   HGBA1C 6.0 (H) 09/14/2017   Last GFR: Lab Results  Component Value Date   GFRNONAA 65 09/14/2017   Patient is on Vitamin D supplement.   Lab Results  Component Value Date   VD25OH 90 09/14/2017      Medication Review: Current Outpatient Medications on File Prior to Visit  Medication Sig Dispense Refill  . anastrozole (ARIMIDEX) 1 MG tablet Take 1 tablet (1 mg total) by mouth daily. 90 tablet 3  . buPROPion (WELLBUTRIN XL) 150 MG 24 hr tablet TAKE 1 TABLET BY MOUTH EVERY DAY IN THE MORNING 90 tablet 1  . carvedilol (COREG) 25 MG tablet TAKE 1 TABLET TWICE A DAY OR AS DIRECTED 180 tablet 1  . Cholecalciferol (VITAMIN D) 2000 UNITS tablet Take 4,000 Units by mouth daily.    . Coenzyme Q10 (COQ10) 100 MG CAPS Take 300 mg by mouth daily. 90 each 11  . cyclobenzaprine (FLEXERIL) 10 MG tablet Take 10 mg by mouth at bedtime as needed (FOR RESTLESS LEGS).   1  . docusate sodium (COLACE) 100 MG capsule Take 1 capsule (100 mg total) by mouth 2 (two) times daily. 30 capsule 0  . DULoxetine (CYMBALTA) 30 MG capsule Take 1 capsule (30 mg total) by mouth 3 (three) times daily. 270 capsule 3  . ELIQUIS 5 MG TABS tablet TAKE 1 TABLET BY MOUTH TWICE A DAY 180 tablet 1  . ferrous sulfate 325 (65 FE) MG tablet TAKE 1 TABLET (325 MG TOTAL) BY MOUTH 2 (TWO) TIMES DAILY WITH A MEAL. 180 tablet 3  . furosemide (LASIX) 20 MG tablet TAKE 1 TABLET BY MOUTH TWICE A DAY 180 tablet 1  . gabapentin (NEURONTIN) 300 MG capsule TAKE ONE CAPSULE BY MOUTH EVERY DAY AT BEDTIME 90 capsule 1  . levothyroxine (SYNTHROID, LEVOTHROID) 50 MCG tablet TAKE 1/2 TO 1 TABLET BY MOUTH ON AN EMPTY STOMCH 30 TO 60 MINUTES BEFORE BREAKFAST 90 tablet 0  . meloxicam (MOBIC) 15 MG tablet TAKE 1/2 TO 1 TABLET DAILY WITH FOOD FOR PAIN *DONT TAKE WITH ALEVE OR IBUPROFEN, CAN TAKE TYLENOL* 90 tablet 1  . mometasone (NASONEX) 50 MCG/ACT  nasal spray USE 1 TO 2 SPRAYS IN EACH NOSTRIL TWICE DAILY 17 g 6  . ranitidine (ZANTAC) 300 MG tablet TAKE 2 TABLETS DAILY AS NEEDED FOR ACID INDIGESTION & REFLUX 180 tablet 1  . rosuvastatin (CRESTOR) 40 MG tablet TAKE 40 MG EVERYDAY EXCEPT ON MONDAYS ,TAKE 80 MG BY MOUTH DAILY 102 tablet 3  . temazepam (RESTORIL) 30 MG capsule TAKE ONE CAPSULE BY MOUTH EVERY DAY AT BEDTIME AS NEEDED FOR SLEEP 90 capsule 1  . traMADol (ULTRAM) 50 MG tablet Take 1 tablet (50 mg total) by mouth every 12 (twelve) hours as needed. 60 tablet 0  . ipratropium (ATROVENT) 0.03 % nasal spray Place 2 sprays into the nose 3 (three) times daily. 54 mL 3  . losartan (COZAAR) 100 MG tablet Take 1 tablet (100 mg total) by mouth daily. 90 tablet 3   No current facility-administered medications on file prior to visit.     Allergies  Allergen  Reactions  . Requip [Ropinirole Hcl] Shortness Of Breath and Nausea And Vomiting  . Minocycline Hives  . Tetracyclines & Related Hives  . Zestril [Lisinopril] Cough  . Zetia [Ezetimibe] Other (See Comments)    MYALGIA JOINT PAIN  . Amoxicillin Rash  . Codeine Other (See Comments)    fatigue  . Morphine Sulfate Other (See Comments)    Palpitations  . Sulfa Antibiotics Other (See Comments)    Headache (pt states that a blood pressure medicine caused a severe headache but doesn't remember a reaction to sulfa)    Current Problems (verified) Patient Active Problem List   Diagnosis Date Noted  . Morbid obesity ([place]) 06/14/2017  . Primary osteoarthritis of left knee 09/23/2016  . Non-ischemic cardiomyopathy ([place])   . Chronic combined systolic and diastolic heart failure, NYHA class 2 ([place]) -- exacerbated by A. fib 08/08/2015  . Paroxysmal atrial fibrillation [facility]): CHA2DS2-VASc Score - starting Eliquis 08/06/2015  . Hypothyroidism 08/06/2015  . History of ulcer disease 08/06/2015  . Absolute anemia   . Encounter for Medicare annual wellness exam 03/31/2015  . DDD (degenerative  disc disease), lumbar 06/16/2014  . Prediabetes 12/18/2013  . Vitamin D deficiency 12/18/2013  . Medication management 12/18/2013  . Paroxysmal supraventricular tachycardia ([place]) 10/18/2013  . Radiculopathy 10/16/2013  . Malignant neoplasm of lower-inner quadrant of left breast in female, estrogen receptor positive ([place]) 05/20/2013  . History of pancreatitis 03/27/2008  . Hyperlipidemia 03/13/2008  . Depression, major, recurrent, in partial remission ([place]) 03/13/2008  . Essential hypertension 03/13/2008  . H/O left bundle branch block 03/13/2008  . Allergic rhinitis 03/13/2008  . GERD 03/13/2008    Screening Tests Immunization History  Administered Date(s) Administered  . Influenza Whole 01/01/2013  . Influenza, High Dose Seasonal PF 01/29/2014, 12/30/2014  . Influenza-Unspecified 12/11/2015, 02/04/2017  . Pneumococcal Conjugate-13 06/16/2014  . Pneumococcal Polysaccharide-23 05/23/2011  . Tdap 03/11/2008  . Zoster 02/02/2010   Preventative care: Last colonoscopy: 2014 Last mammogram: 05/2017 (05/06/2013 + right breast cancer s/p lumpectomy) Last pap smear/pelvic exam: remote   DEXA: 12/2013- normal Renal US 08/2015 Echo 10/2015  Prior vaccinations: TD or Tdap: 2009 due after 03/2018 Influenza: 2018 Pneumococcal: 2013 Prevnar 13: 2016 Shingles/Zostavax: 2011  Names of Other Physician/Practitioners you currently use: 1. [facility] here for primary care 2. [facility], eye doctor, last visit April 2015 3. [facility], dentist, last visit 2019, q 6 months  Patient Care Team: Unk Pinto, MD as PCP - General (Internal Medicine) Magrinat, Virgie Dad, MD as Consulting Physician (Oncology) Leonie Man, MD as Consulting Physician (Cardiology) Dorna Leitz, MD as Consulting Physician (Orthopedic Surgery) Rolm Bookbinder, MD as Consulting Physician (General Surgery) Eppie Gibson, MD as Attending  Physician (Radiation Oncology)  SURGICAL HISTORY She  has a past surgical history that includes Cataract extraction bilateral w/ anterior vitrectomy (Bilateral); Abdominal hysterectomy; Cholecystectomy; Knee arthroscopy (Bilateral); Tonsillectomy; Colonoscopy (2014); Upper gi endoscopy; Dupuytren / palmar fasciotomy (Bilateral, 2007); Tubal ligation; epidural injections; Lumbar fusion (10/2013); Cardiac catheterization (N/A, 08/10/2015); transthoracic echocardiogram (08/07/2015); transthoracic echocardiogram (10/2015); Joint replacement; Total knee arthroplasty (Left, 09/23/2016); Breast lumpectomy (Left); Foot Tendon Surgery (Right, ~ 11/2015); and Total knee arthroplasty (Left, 09/23/2016). FAMILY HISTORY Her family history includes COPD in her father; Heart attack in her mother; Hypertension in her father; Stroke in her mother. SOCIAL HISTORY She  reports that she has never smoked. She has never used smokeless tobacco. She reports that she drinks alcohol. She reports that she does not  use drugs.   Review of Systems  Constitutional: Negative for malaise/fatigue and weight loss.  HENT: Negative for hearing loss and tinnitus.   Eyes: Negative for blurred vision and double vision.  Respiratory: Negative for cough, sputum production, shortness of breath and wheezing.   Cardiovascular: Negative for chest pain, palpitations, orthopnea, claudication, leg swelling and PND.  Gastrointestinal: Negative for abdominal pain, blood in stool, constipation, diarrhea, heartburn, melena, nausea and vomiting.  Genitourinary: Negative.   Musculoskeletal: Negative for falls, joint pain and myalgias.  Skin: Negative for rash.  Neurological: Negative for dizziness, tingling, sensory change, weakness and headaches.  Endo/Heme/Allergies: Negative for polydipsia.  Psychiatric/Behavioral: Negative.  Negative for depression, memory loss, substance abuse and suicidal ideas. The patient is not nervous/anxious and does not  have insomnia.   All other systems reviewed and are negative.    Objective:     Today's Vitals   01/16/18 1400  BP: 120/78  Pulse: 64  Resp: 14  Temp: 98 F (36.7 C)  SpO2: 98%  Weight: 225 lb 9.6 oz (102.3 kg)  Height: 5' 4.5" (1.638 m)   Body mass index is 38.13 kg/m.  General appearance: alert, no distress, WD/WN, female HEENT: normocephalic, sclerae anicteric, TMs pearly, nares patent, no discharge or erythema, pharynx normal Oral cavity: MMM, no lesions Neck: supple, no lymphadenopathy, no thyromegaly, no masses Heart: RRR, normal S1, S2, no murmurs Lungs: CTA bilaterally, no wheezes, rhonchi, or rales Abdomen: +bs, soft, non tender, non distended, no masses, no hepatomegaly, no splenomegaly Musculoskeletal: nontender, no swelling, no obvious deformity Extremities: no edema, no cyanosis, no clubbing Pulses: 2+ symmetric, upper and lower extremities, normal cap refill Neurological: alert, oriented x 3, CN2-12 intact, strength normal upper extremities and lower extremities, sensation normal throughout, DTRs 2+ throughout, no cerebellar signs, gait normal Psychiatric: normal affect, behavior normal, pleasant    Vicie Mutters, PA-C   01/16/2018

## 2018-01-16 ENCOUNTER — Ambulatory Visit: Payer: Medicare Other | Admitting: Physician Assistant

## 2018-01-16 ENCOUNTER — Encounter: Payer: Self-pay | Admitting: Physician Assistant

## 2018-01-16 VITALS — BP 120/78 | HR 64 | Temp 98.0°F | Resp 14 | Ht 64.5 in | Wt 225.6 lb

## 2018-01-16 DIAGNOSIS — I480 Paroxysmal atrial fibrillation: Secondary | ICD-10-CM

## 2018-01-16 DIAGNOSIS — E039 Hypothyroidism, unspecified: Secondary | ICD-10-CM

## 2018-01-16 DIAGNOSIS — Z8679 Personal history of other diseases of the circulatory system: Secondary | ICD-10-CM

## 2018-01-16 DIAGNOSIS — Z8719 Personal history of other diseases of the digestive system: Secondary | ICD-10-CM

## 2018-01-16 DIAGNOSIS — F3341 Major depressive disorder, recurrent, in partial remission: Secondary | ICD-10-CM

## 2018-01-16 DIAGNOSIS — C50312 Malignant neoplasm of lower-inner quadrant of left female breast: Secondary | ICD-10-CM

## 2018-01-16 DIAGNOSIS — K219 Gastro-esophageal reflux disease without esophagitis: Secondary | ICD-10-CM

## 2018-01-16 DIAGNOSIS — M5136 Other intervertebral disc degeneration, lumbar region: Secondary | ICD-10-CM

## 2018-01-16 DIAGNOSIS — Z79899 Other long term (current) drug therapy: Secondary | ICD-10-CM

## 2018-01-16 DIAGNOSIS — Z87898 Personal history of other specified conditions: Secondary | ICD-10-CM

## 2018-01-16 DIAGNOSIS — M51369 Other intervertebral disc degeneration, lumbar region without mention of lumbar back pain or lower extremity pain: Secondary | ICD-10-CM

## 2018-01-16 DIAGNOSIS — J3089 Other allergic rhinitis: Secondary | ICD-10-CM

## 2018-01-16 DIAGNOSIS — Z0001 Encounter for general adult medical examination with abnormal findings: Secondary | ICD-10-CM

## 2018-01-16 DIAGNOSIS — Z0000 Encounter for general adult medical examination without abnormal findings: Secondary | ICD-10-CM

## 2018-01-16 DIAGNOSIS — E782 Mixed hyperlipidemia: Secondary | ICD-10-CM

## 2018-01-16 DIAGNOSIS — M1712 Unilateral primary osteoarthritis, left knee: Secondary | ICD-10-CM

## 2018-01-16 DIAGNOSIS — I428 Other cardiomyopathies: Secondary | ICD-10-CM

## 2018-01-16 DIAGNOSIS — M5416 Radiculopathy, lumbar region: Secondary | ICD-10-CM

## 2018-01-16 DIAGNOSIS — Z170 Estrogen receptor positive status [ER+]: Secondary | ICD-10-CM

## 2018-01-16 DIAGNOSIS — I471 Supraventricular tachycardia, unspecified: Secondary | ICD-10-CM

## 2018-01-16 DIAGNOSIS — D649 Anemia, unspecified: Secondary | ICD-10-CM

## 2018-01-16 DIAGNOSIS — I5042 Chronic combined systolic (congestive) and diastolic (congestive) heart failure: Secondary | ICD-10-CM

## 2018-01-16 DIAGNOSIS — R7303 Prediabetes: Secondary | ICD-10-CM

## 2018-01-16 DIAGNOSIS — I10 Essential (primary) hypertension: Secondary | ICD-10-CM

## 2018-01-16 DIAGNOSIS — E559 Vitamin D deficiency, unspecified: Secondary | ICD-10-CM

## 2018-01-16 MED ORDER — TEMAZEPAM 15 MG PO CAPS: ORAL_CAPSULE | ORAL | 0 refills | Status: DC

## 2018-01-16 MED ORDER — OMEPRAZOLE 40 MG PO CPDR: 40.0000 mg | DELAYED_RELEASE_CAPSULE | Freq: Every day | ORAL | 0 refills | Status: DC

## 2018-01-16 NOTE — Patient Instructions (Addendum)
Check out [facility] and see if you can get dry needling there without insurance [phone]  Check out  Mini habits for weight loss book  Mobic is an antiinflammatory It helps pain, can not take with aleve, or ibuprofen You can take tylenol (500mg ) or tylenol arthritis (650mg ) with the meloxicam/antiinflammatories. The max you can take of tylenol a day is 3000mg  daily, this is a max of 6 pills a day of the regular tyelnol (500mg ) or a max of 4 a day of the tylenol arthritis (650mg ) as long as no other medications you are taking contain tylenol.   Mobic can cause inflammation in your stomach and can cause ulcers or bleeding, this will look like black tarry stools Make sure you take your mobic with food Try not to take it daily, take AS needed Can take with Pepcid   SUGGEST TAKING PRILOSEC OVER THE COUNTER X 2 WEEKS THEN GO BACK TO THE PRESCRIPTION ZANTAC   2 apps for tracking food is myfitness pal  loseit OR can take picture of your food      When it comes to diets, agreement about the perfect plan isn't easy to find, even among the experts. Experts at the [employer] developed an idea known as the Healthy Eating Plate. Just imagine a plate divided into logical, healthy portions.  The emphasis is on diet quality:  Load up on vegetables and fruits - one-half of your plate: Aim for color and variety, and remember that potatoes don't count.  Go for whole grains - one-quarter of your plate: Whole wheat, barley, wheat berries, quinoa, oats, brown rice, and foods made with them. If you want pasta, go with whole wheat pasta.  Protein power - one-quarter of your plate: Fish, chicken, beans, and nuts are all healthy, versatile protein sources. Limit red meat.  The diet, however, does go beyond the plate, offering a few other suggestions.  Use healthy plant oils, such as olive, canola, soy, corn, sunflower and peanut. Check the labels, and avoid  partially hydrogenated oil, which have unhealthy trans fats.  If you're thirsty, drink water. Coffee and tea are good in moderation, but skip sugary drinks and limit milk and dairy products to one or two daily servings.  The type of carbohydrate in the diet is more important than the amount. Some sources of carbohydrates, such as vegetables, fruits, whole grains, and beans-are healthier than others.  Finally, stay active.

## 2018-01-17 LAB — URINALYSIS, ROUTINE W REFLEX MICROSCOPIC
Bacteria, UA: NONE SEEN /HPF
Bilirubin Urine: NEGATIVE
GLUCOSE, UA: NEGATIVE
Hgb urine dipstick: NEGATIVE
Ketones, ur: NEGATIVE
NITRITE: NEGATIVE
SPECIFIC GRAVITY, URINE: 1.023 (ref 1.001–1.03)
pH: <=5 (ref 5.0–8.0)

## 2018-01-17 LAB — COMPLETE METABOLIC PANEL WITH GFR
AG RATIO: 1.7 (calc) (ref 1.0–2.5)
ALT: 13 U/L (ref 6–29)
AST: 17 U/L (ref 10–35)
Albumin: 4.5 g/dL (ref 3.6–5.1)
Alkaline phosphatase (APISO): 93 U/L (ref 33–130)
BUN/Creatinine Ratio: 26 (calc) — ABNORMAL HIGH (ref 6–22)
BUN: 26 mg/dL — ABNORMAL HIGH (ref 7–25)
CALCIUM: 9.9 mg/dL (ref 8.6–10.4)
CHLORIDE: 101 mmol/L (ref 98–110)
CO2: 27 mmol/L (ref 20–32)
Creat: 1.01 mg/dL — ABNORMAL HIGH (ref 0.60–0.93)
GFR, EST NON AFRICAN AMERICAN: 56 mL/min/{1.73_m2} — AB (ref >=60)
GFR, Est African American: 65 mL/min/{1.73_m2} (ref >=60)
Globulin: 2.6 g/dL (calc) (ref 1.9–3.7)
Glucose, Bld: 79 mg/dL (ref 65–99)
POTASSIUM: 4 mmol/L (ref 3.5–5.3)
Sodium: 138 mmol/L (ref 135–146)
Total Bilirubin: 0.3 mg/dL (ref 0.2–1.2)
Total Protein: 7.1 g/dL (ref 6.1–8.1)

## 2018-01-17 LAB — CBC WITH DIFFERENTIAL/PLATELET
Basophils Absolute: 89 cells/uL (ref 0–200)
Basophils Relative: 1.1 %
Eosinophils Absolute: 259 cells/uL (ref 15–500)
Eosinophils Relative: 3.2 %
HCT: 33.4 % — ABNORMAL LOW (ref 35.0–45.0)
Hemoglobin: 11 g/dL — ABNORMAL LOW (ref 11.7–15.5)
Lymphs Abs: 1661 cells/uL (ref 850–3900)
MCH: 29.6 pg (ref 27.0–33.0)
MCHC: 32.9 g/dL (ref 32.0–36.0)
MCV: 89.8 fL (ref 80.0–100.0)
MONOS PCT: 7.7 %
MPV: 11.6 fL (ref 7.5–12.5)
NEUTROS PCT: 67.5 %
Neutro Abs: 5468 cells/uL (ref 1500–7800)
Platelets: 277 10*3/uL (ref 140–400)
RBC: 3.72 10*6/uL — AB (ref 3.80–5.10)
RDW: 13.1 % (ref 11.0–15.0)
TOTAL LYMPHOCYTE: 20.5 %
WBC mixed population: 624 cells/uL (ref 200–950)
WBC: 8.1 10*3/uL (ref 3.8–10.8)

## 2018-01-17 LAB — HEMOGLOBIN A1C
EAG (MMOL/L): 7.3 (calc)
HEMOGLOBIN A1C: 6.2 %{Hb} — AB (ref <5.7)
MEAN PLASMA GLUCOSE: 131 (calc)

## 2018-01-17 LAB — LIPID PANEL
CHOL/HDL RATIO: 3 (calc) (ref <5.0)
Cholesterol: 144 mg/dL (ref <200)
HDL: 48 mg/dL — ABNORMAL LOW (ref >50)
LDL Cholesterol (Calc): 71 mg/dL (calc)
Non-HDL Cholesterol (Calc): 96 mg/dL (calc) (ref <130)
Triglycerides: 172 mg/dL — ABNORMAL HIGH (ref <150)

## 2018-01-17 LAB — VITAMIN D 25 HYDROXY (VIT D DEFICIENCY, FRACTURES): Vit D, 25-Hydroxy: 102 ng/mL — ABNORMAL HIGH (ref 30–100)

## 2018-01-17 LAB — MAGNESIUM: Magnesium: 2.1 mg/dL (ref 1.5–2.5)

## 2018-01-17 LAB — TSH: TSH: 1.67 mIU/L (ref 0.40–4.50)

## 2018-01-17 LAB — MICROALBUMIN / CREATININE URINE RATIO
CREATININE, URINE: 128 mg/dL (ref 20–275)
MICROALB/CREAT RATIO: 25 ug/mg{creat} (ref <30)
Microalb, Ur: 3.2 mg/dL

## 2018-01-24 ENCOUNTER — Other Ambulatory Visit: Payer: Self-pay | Admitting: Internal Medicine

## 2018-02-06 ENCOUNTER — Other Ambulatory Visit: Payer: Self-pay | Admitting: Cardiology

## 2018-02-09 ENCOUNTER — Other Ambulatory Visit: Payer: Self-pay | Admitting: Physician Assistant

## 2018-02-27 ENCOUNTER — Other Ambulatory Visit: Payer: Self-pay | Admitting: Physical Medicine and Rehabilitation

## 2018-02-27 ENCOUNTER — Telehealth: Payer: Self-pay

## 2018-02-27 DIAGNOSIS — M5412 Radiculopathy, cervical region: Secondary | ICD-10-CM

## 2018-02-27 NOTE — Telephone Encounter (Signed)
   [facility] Pre-operative Risk Assessment    Request for surgical clearance:  1. What type of surgery is being performed? Cervical ESI  2. When is this surgery scheduled? TBD  3. What type of clearance is required (medical clearance vs. Pharmacy clearance to hold med vs. Both)? Both  4. Are there any medications that need to be held prior to surgery and how long? Eliquis hold 2 days prior  5. Practice name and name of physician performing surgery? [facility]   6. What is your office phone number [phone]   7.   What is your office fax number [phone]  8.   Anesthesia type Not listed   Kathyrn Lass 02/27/2018, 5:45 PM  _________________________________________________________________   (provider comments below)

## 2018-02-28 NOTE — Telephone Encounter (Signed)
Pt called today, doing well from cardiac standpoint. I will route to pharmacy for Eliquis recommendations and then send clearance.  Kerin Ransom PA-C 02/28/2018 4:29 PM

## 2018-02-28 NOTE — Telephone Encounter (Signed)
Patient with diagnosis of atrial fibrillation [name] for anticoagulation.    Procedure: cervical ESI Date of procedure: TBD  CHADS2-VASc score of  4 (CHF, HTN, AGE, , female)  CrCl 82.5 Platelet count 277  Per office protocol, patient can hold Eliquis for 3 days prior to procedure.  (We recommend holding anticoagulation for 3 days for any spinal procedure)  Patient should restart Eliquis on the evening of procedure or day after, at discretion of procedure MD

## 2018-03-01 NOTE — Telephone Encounter (Signed)
   Primary Cardiologist: Dr Ellyn Hack  Chart reviewed and pt contacted by phone 11/20 as part of pre-operative protocol coverage. Based on ACC/AHA guidelines, [name] [name] would be at acceptable risk for the planned procedure without further cardiovascular testing.   OK to hold Eliquis 3 day pre op.   I will route this recommendation to the requesting party via Epic fax function and remove from pre-op pool.  Please call with questions.  Kerin Ransom, PA-C 03/01/2018, 2:17 PM

## 2018-03-03 ENCOUNTER — Other Ambulatory Visit: Payer: Self-pay | Admitting: Adult Health

## 2018-03-04 ENCOUNTER — Other Ambulatory Visit: Payer: Self-pay | Admitting: Internal Medicine

## 2018-03-14 ENCOUNTER — Other Ambulatory Visit: Payer: Self-pay | Admitting: Cardiology

## 2018-03-14 NOTE — Telephone Encounter (Signed)
REFILLED ANNUAL

## 2018-03-21 ENCOUNTER — Ambulatory Visit
Admission: RE | Admit: 2018-03-21 | Discharge: 2018-03-21 | Disposition: A | Discharge status: Home / Self Care | Payer: Medicare Other | Source: Ambulatory Visit | Attending: Physical Medicine and Rehabilitation | Admitting: Physical Medicine and Rehabilitation

## 2018-03-21 DIAGNOSIS — M5412 Radiculopathy, cervical region: Secondary | ICD-10-CM

## 2018-03-21 MED ORDER — IOPAMIDOL (ISOVUE-M 300) INJECTION 61%
1.0000 mL | Freq: Once | INTRAMUSCULAR | Status: AC | PRN
  Administered 2018-03-21: 1 mL via EPIDURAL

## 2018-03-21 MED ORDER — TRIAMCINOLONE ACETONIDE 40 MG/ML IJ SUSP (RADIOLOGY)
60.0000 mg | Freq: Once | INTRAMUSCULAR | Status: AC
  Administered 2018-03-21: 60 mg via EPIDURAL

## 2018-03-21 NOTE — Discharge Instructions (Signed)
Spinal Injection Discharge Instruction Sheet ° °1. You may resume a regular diet and any medications that you routinely take, including pain medications. ° °2. No driving the rest of the day of the procedure. ° °3. Light activity throughout the rest of the day.  Do not do any strenuous work, exercise, bending or lifting.  The day following the procedure, you may resume normal physical activity but you should refrain from exercising or physical therapy for at least three days. ° ° °Common Side Effects: ° °· Headaches- take your usual medications as directed by your physician.   ° °· Restlessness or inability to sleep- you may have trouble sleeping for the next few days.  Ask your referring physician if you need any medication for sleep if over the counter sleep medications do not help. ° °· Facial flushing or redness- this should subside within a few days. ° °· Increased pain- a temporary increase in pain a day or two following your procedure is not unusual.  Take your pain medication as prescribed by your referring physician.  You may use ice to the injection site as needed.  Please do not use heat for 24 hours. ° °· Leg cramps ° °Please contact our office at [phone] for the following symptoms: °· Fever greater than 100 degrees. °· Headaches unresolved with medication after 2-3 days. °· Increased swelling, pain, or redness at injection site. ° °Thank you for visiting our office. ° ° °You may resume Eliquis today. °

## 2018-03-23 ENCOUNTER — Encounter: Payer: Self-pay | Admitting: Cardiology

## 2018-03-23 ENCOUNTER — Ambulatory Visit: Payer: Medicare Other | Admitting: Cardiology

## 2018-03-23 VITALS — BP 130/60 | HR 80 | Ht 64.5 in | Wt 230.0 lb

## 2018-03-23 DIAGNOSIS — I471 Supraventricular tachycardia: Secondary | ICD-10-CM | POA: Diagnosis not present

## 2018-03-23 DIAGNOSIS — E782 Mixed hyperlipidemia: Secondary | ICD-10-CM

## 2018-03-23 DIAGNOSIS — I480 Paroxysmal atrial fibrillation: Secondary | ICD-10-CM

## 2018-03-23 DIAGNOSIS — I428 Other cardiomyopathies: Secondary | ICD-10-CM

## 2018-03-23 DIAGNOSIS — I5042 Chronic combined systolic (congestive) and diastolic (congestive) heart failure: Secondary | ICD-10-CM

## 2018-03-23 DIAGNOSIS — I10 Essential (primary) hypertension: Secondary | ICD-10-CM | POA: Diagnosis not present

## 2018-03-23 NOTE — Progress Notes (Signed)
PCP: Unk Pinto, MD  Clinic Note: Chief Complaint  Patient presents with  . Follow-up    12 months.  . Headache  . Chest Pain    HPI: SUMAN TRIVEDI is a 72 y.o. female with a PMH notable for nonischemic cardiomyopathy with a EF of 45 to 50%, LBBB and palpitations as well as A. fib.  Below who presents today for delayed 20-month follow-up.  six-month follow-up with a history nonischemic cardiomyopathy (EF 45-50%), LBBB & palpitations - h/o Afib.    She has long history of intermittent left bundle branch block. Nonobstructive CAD (noted Angiographic normal coronary arteries in May 2017)   She has had a history of A. Fib, but not on anticoagulation because of anemia.  JAYLINE KILBURG was last seen on June 07, 2017.  Increase Crestor to 80 mg daily. --Was then seen at PCP office on March 6.  Plan was to increase carvedilol to 25 mg twice daily.  Recent Hospitalizations:   None  Studies Personally Reviewed - (if available, images/films reviewed: From Epic Chart or Care Everywhere)  None   Interval History: Shivonne comes in today doing well.  The main thing she is noting now is symptoms of a pinched nerve in her neck with radiculopathy and neuropathy symptoms. She does note that she has occasional fluttering off and on lasting maybe a minute or so.  This is can happen off and on for about 2weeks may be up to 1 2 times a day and then will go away for couple weeks.  No real rhyme or reason. She has not really been doing much of late because of this neck issue.  She has been on a combination of Cymbalta and gabapentin and notes that she has been somewhat sleepy.  Her fluttering palpitations have lasted no longer than a minute or so and nothing to suggest recurrence of A. fib.  No really signs of an arrhythmia.  She denies any chest pain or pressure with rest or exertion.  She does have some baseline exertional dyspnea but nothing really different than her baseline.  She  still gets dizzy if he bends over or stands up really fast but otherwise no syncope or near syncope.  Cardiovascular ROS: positive for - dyspnea on exertion, irregular heartbeat, palpitations and Occasional dizziness negative for - chest pain, edema, orthopnea, paroxysmal nocturnal dyspnea, rapid heart rate, shortness of breath or Syncope/near syncope, TIA versus amaurosis fugax.  No claudication  ROS: A comprehensive was performed. Review of Systems  Constitutional: Negative for malaise/fatigue.  HENT: Negative for congestion and nosebleeds.   Respiratory: Negative for cough and wheezing.        Per history of present illness  Cardiovascular: Negative for claudication.       Per history of present illness  Gastrointestinal: Negative for blood in stool, constipation, heartburn, melena and nausea.  Genitourinary: Negative for hematuria.  Musculoskeletal: Positive for back pain, joint pain (still has knee pain (B).) and neck pain.  Neurological: Positive for tingling (Neuropathy/radiculopathy symptoms from her neck pain.).  Psychiatric/Behavioral: Negative for memory loss. The patient is not nervous/anxious and does not have insomnia.   All other systems reviewed and are negative.  I have reviewed and (if needed) personally updated the patient's problem list, medications, allergies, past medical and surgical history, social and family history.   Past Medical History:  Diagnosis Date  . Allergy    Nasonex daily as needed  . Anemia   . Anxiety   .  Arthritis   . Breast cancer ([place])     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  . Chronic back pain    cyst sitting on L4-5;slipped disc  . Depression    takes Cymbalta daily  . Diverticulosis   . Dysrhythmia   . GERD (gastroesophageal reflux disease)    takes Omeprazole daily  . Headache(784.0)    rare  . History of gastric ulcer at age 12  . History of shingles   . Hyperlipidemia   . Hypertension    takes  Cardura nightly and Verapamil daily  . Hypothyroidism   . Insomnia    takes Restoril nightly  . Joint pain   . Nonischemic cardiomyopathy ([place]) 07/2015   Normal coronary arteries by cath. EF was 25-30% by echo. GR2 DD - likely related to LBBB in setting of A. fib  . Obesity (BMI 30-39.9) 06/11/2013  . Paroxysmal atrial fibrillation ([place]): CHA2DS2-VASc Score - starting Eliquis 08/06/2015   This patients CHA2DS2-VASc Score and unadjusted Ischemic Stroke Rate (% per year) is equal to 4.8 % stroke rate/year from a score of 4  Above score calculated as 1 point each if present [CHF, HTN, DM, Vascular=MI/PAD/Aortic Plaque, Age if 65-74, or Female]; 2 points each if present [Age > 75, or Stroke/TIA/TE]  . PONV (postoperative nausea and vomiting)   . S/P radiation therapy 07/18/2013-08/28/2013   1) Left Breast / 50 Gy in 25 fractions/ 2) Left Breast Boost / 10 Gy in 5 fractions  . Shingles   . Sinus drainage    put on Levaquin yesterday if no better in 3 days will start prednisone  . Thyroid disease   . Weakness    tingling and numbness both hands and left leg  . Wears glasses     Past Surgical History:  Procedure Laterality Date  . ABDOMINAL HYSTERECTOMY    . BREAST LUMPECTOMY Left    neg  . CARDIAC CATHETERIZATION N/A 08/10/2015   Procedure: Right/Left Heart Cath and Coronary Angiography;  Surgeon: Burnell Blanks, MD;  Location: [facility];  Service: Cardiovascular;: Nonobstructive CAD  . CATARACT EXTRACTION BILATERAL W/ ANTERIOR VITRECTOMY Bilateral   . CHOLECYSTECTOMY    . COLONOSCOPY  2014  . DUPUYTREN / PALMAR FASCIOTOMY Bilateral 2007   x 3 to left and once to the right   . epidural injections     x 3  . FOOT TENDON SURGERY Right ~ 11/2015   "stepped in a hole and tore 2 tendons"  . JOINT REPLACEMENT    . KNEE ARTHROSCOPY Bilateral   . LUMBAR FUSION  10/2013   Dr. Lynann Bologna  . TONSILLECTOMY    . TOTAL KNEE ARTHROPLASTY Left 09/23/2016  . TOTAL KNEE ARTHROPLASTY Left  09/23/2016   Procedure: TOTAL KNEE ARTHROPLASTY;  Surgeon: Dorna Leitz, MD;  Location: [facility];  Service: Orthopedics;  Laterality: Left;  . TRANSTHORACIC ECHOCARDIOGRAM  08/07/2015   Severely reduced LVEF at 25-30% with diffuse HK. GR 2 DD. Ventricular dyssynergy secondary to LBBB. Small to moderate pericardial effusion but no hemodynamic compromise  . TRANSTHORACIC ECHOCARDIOGRAM  10/2015    (EF up from 25-30%): - Left ventricle: Poor acoustic windows -  difficult to see.    EF estimated at 45-50% with inferoseptal and possible posterior hypokinesis. GR 1 DD. No pericardial effusion.  . TUBAL LIGATION    . UPPER GI ENDOSCOPY      Current Meds  Medication Sig  . anastrozole (ARIMIDEX) 1 MG tablet Take 1 tablet (  1 mg total) by mouth daily.  Marland Kitchen buPROPion (WELLBUTRIN XL) 150 MG 24 hr tablet TAKE 1 TABLET BY MOUTH EVERY DAY IN THE MORNING  . carvedilol (COREG) 25 MG tablet TAKE 1 TABLET TWICE A DAY OR AS DIRECTED  . Cholecalciferol (VITAMIN D) 2000 UNITS tablet Take 4,000 Units by mouth daily.  . Coenzyme Q10 (COQ10) 100 MG CAPS Take 300 mg by mouth daily.  . cyclobenzaprine (FLEXERIL) 10 MG tablet Take 10 mg by mouth at bedtime as needed (FOR RESTLESS LEGS).   Marland Kitchen docusate sodium (COLACE) 100 MG capsule Take 1 capsule (100 mg total) by mouth 2 (two) times daily.  . DULoxetine (CYMBALTA) 30 MG capsule Take 1 capsule (30 mg total) by mouth 3 (three) times daily.  Marland Kitchen ELIQUIS 5 MG TABS tablet TAKE 1 TABLET BY MOUTH TWICE A DAY  . ferrous sulfate 325 (65 FE) MG tablet TAKE 1 TABLET (325 MG TOTAL) BY MOUTH 2 (TWO) TIMES DAILY WITH A MEAL.  . furosemide (LASIX) 20 MG tablet TAKE 1 TABLET BY MOUTH TWICE A DAY  . gabapentin (NEURONTIN) 300 MG capsule TAKE ONE CAPSULE BY MOUTH EVERY DAY AT BEDTIME  . levothyroxine (SYNTHROID, LEVOTHROID) 50 MCG tablet TAKE 1/2 TO 1 TABLET BY MOUTH ON AN EMPTY STOMCH 30 TO 60 MINUTES BEFORE BREAKFAST  . losartan (COZAAR) 100 MG tablet TAKE 1 TABLET BY MOUTH EVERY DAY REPLACES  VALSARTAN  . meloxicam (MOBIC) 15 MG tablet TAKE 1/2 TO 1 TABLET DAILY WITH FOOD FOR PAIN *DONT TAKE WITH ALEVE OR IBUPROFEN, CAN TAKE TYLENOL*  . mometasone (NASONEX) 50 MCG/ACT nasal spray USE 1 TO 2 SPRAYS IN EACH NOSTRIL TWICE DAILY  . omeprazole (PRILOSEC) 40 MG capsule TAKE 1 CAPSULE BY MOUTH EVERY DAY  . rosuvastatin (CRESTOR) 40 MG tablet TAKE 40 MG EVERYDAY EXCEPT ON MONDAYS ,TAKE 80 MG BY MOUTH DAILY  . temazepam (RESTORIL) 15 MG capsule TAKE TWO CAPSULE BY MOUTH EVERY DAY AT BEDTIME AS NEEDED FOR SLEEP  . traMADol (ULTRAM) 50 MG tablet Take 1 tablet (50 mg total) by mouth every 12 (twelve) hours as needed.    Allergies  Allergen Reactions  . Requip [Ropinirole Hcl] Shortness Of Breath and Nausea And Vomiting  . Minocycline Hives  . Tetracyclines & Related Hives  . Zestril [Lisinopril] Cough  . Zetia [Ezetimibe] Other (See Comments)    MYALGIA JOINT PAIN  . Amoxicillin Rash  . Codeine Other (See Comments)    fatigue  . Morphine Sulfate Other (See Comments)    Palpitations  . Sulfa Antibiotics Other (See Comments)    Headache (pt states that a blood pressure medicine caused a severe headache but doesn't remember a reaction to sulfa)    Social History   Tobacco Use  . Smoking status: Never Smoker  . Smokeless tobacco: Never Used  Substance Use Topics  . Alcohol use: Yes    Comment: wine occasionally-rarely  . Drug use: No   Social History   Social History Narrative  . Not on file     family history includes COPD in her father; Heart attack in her mother; Hypertension in her father; Stroke in her mother.  Wt Readings from Last 3 Encounters:  03/23/18 230 lb (104.3 kg)  01/16/18 225 lb 9.6 oz (102.3 kg)  09/14/17 222 lb 3.2 oz (100.8 kg)    PHYSICAL EXAM BP 130/60 (BP Location: Left Arm, Patient Position: Sitting, Cuff Size: Large)   Pulse 80   Ht 5' 4.5" (1.638 m)   [facility]  230 lb (104.3 kg)   BMI 38.87 kg/m  Physical Exam  Constitutional: She is oriented  to person, place, and time. She appears well-developed and well-nourished. No distress.  Morbidly obese (BMI 38 with HTN HLD).  Healthy-appearing.  Well-groomed  HENT:  Head: Normocephalic and atraumatic.  Neck: No hepatojugular reflux and no JVD present. Muscular tenderness present. Carotid bruit is not present. Decreased range of motion (More because of discomfort than actual true passive range of motion.) present.  Cardiovascular: Normal rate, regular rhythm, intact distal pulses and normal pulses.  No extrasystoles are present. PMI is not displaced. Exam reveals no gallop and no friction rub.  Murmur heard.  Medium-pitched harsh early systolic murmur is present with a grade of 1/6 at the upper left sternal border. Partially split S2  Pulmonary/Chest: Effort normal and breath sounds normal. No respiratory distress. She has no wheezes. She has no rales.  Abdominal: Soft. Bowel sounds are normal. She exhibits no distension. There is no abdominal tenderness. There is no rebound.  morbidly obese  Musculoskeletal:        General: No edema.  Neurological: She is alert and oriented to person, place, and time.  Psychiatric: She has a normal mood and affect. Her behavior is normal. Judgment and thought content normal.  Nursing note and vitals reviewed.     Adult ECG Report Not checked   Other studies Reviewed: Additional studies/ records that were reviewed today include:  Recent Labs:   Lab Results  Component Value Date   CHOL 144 01/16/2018   HDL 48 (L) 01/16/2018   LDLCALC 71 01/16/2018   TRIG 172 (H) 01/16/2018   CHOLHDL 3.0 01/16/2018   Lab Results  Component Value Date   CREATININE 1.01 (H) 01/16/2018   BUN 26 (H) 01/16/2018   NA 138 01/16/2018   K 4.0 01/16/2018   CL 101 01/16/2018   CO2 27 01/16/2018    ASSESSMENT / PLAN: Problem List Items Addressed This Visit    Chronic combined systolic and diastolic heart failure, NYHA class 2 (HCC) -- exacerbated by A. fib (Chronic)     Euvolemic on current dose of Lasix.  On stable dose of carvedilol and losartan.      Essential hypertension (Chronic)    Stable blood pressure on current meds.  No change      Hyperlipidemia (Chronic)    Labs from October look great.  On stable dose of statin.  No change.      Morbid obesity ([place])    The patient understands the need to lose weight with diet and exercise. We have discussed specific strategies for this.      Non-ischemic cardiomyopathy ([place]) (Chronic)    EF has improved and not really having any true heart failure symptoms.  He is on carvedilol and losartan at good doses.  Relatively euvolemic on her current low-dose of Lasix.  I recommended that she can just take it 40 mg once a day as opposed to 20 mg twice daily.      Paroxysmal atrial fibrillation ([place]): CHA2DS2-VASc Score - starting Eliquis - Primary (Chronic)    Probably has brief episodes of PAF as well as PSVT.  But has not really seem to having anything more than brief flutter episodes of probably PACs and PVCs may be short bursts of PSVT.  Does not sound like A. fib. She is on a stable dose of carvedilol tolerating it well.  Is also [name] with no bleeding issues.  Relevant Orders   EKG 12-Lead   Paroxysmal supraventricular tachycardia ([place])    Probably short little bursts.  Not long enough to consider Wegener's, however they do last more than a couple minutes I did recommend she try vagal maneuvers.  If there is an episode that is prolonged, she can easily come to the emergency room for for evaluation and management of either PSVT or PAF.      Relevant Orders   EKG 12-Lead      Current medicines are reviewed at length with the patient today. (+/- concerns) n/a The following changes have been made: n/a  Patient Instructions  Medication Instructions:  Not needed  Recommendations for vagal maneuvers:  "Bearing down"  Coughing  Gagging  Icy, cold towel on face or drink ice cold  water  If you need a refill on your cardiac medications before your next appointment, please call your pharmacy.   Lab work: Not needed If you have labs (blood work) drawn today and your tests are completely normal, you will receive your results only by: Marland Kitchen MyChart Message (if you have MyChart) OR . A paper copy in the mail If you have any lab test that is abnormal or we need to change your treatment, we will call you to review the results.  Testing/Procedures: Not needed  Follow-Up: At [facility], you and your health needs are our priority.  As part of our continuing mission to provide you with exceptional heart care, we have created designated Provider Care Teams.  These Care Teams include your primary Cardiologist (physician) and Advanced Practice Providers (APPs -  Physician Assistants and Nurse Practitioners) who all work together to provide you with the care you need, when you need it. You will need a follow up appointment in 12 months dec 2020.  Please call our office 2 months in advance to schedule this appointment.  You may see Glenetta Hew, MD or one of the following Advanced Practice Providers on your designated Care Team:   Rosaria Ferries, PA-C . Jory Sims, DNP, ANP-- Your physician recommends that you schedule a follow-up appointment in 6 month visit .   Any Other Special Instructions Will Be Listed Below (If Applicable).    if symptoms comes back - irregular heart rate and it last more than 2 hours contact OFFICE or go to ER FOR EVAULATION      Studies Ordered:   Orders Placed This Encounter  Procedures  . EKG 12-Lead      Glenetta Hew, M.D., M.S. Interventional Cardiologist   Pager # [phone] Phone # [phone] [address] [place], [place] [address]

## 2018-03-23 NOTE — Patient Instructions (Signed)
Medication Instructions:  Not needed  Recommendations for vagal maneuvers:  "Bearing down"  Coughing  Gagging  Icy, cold towel on face or drink ice cold water  If you need a refill on your cardiac medications before your next appointment, please call your pharmacy.   Lab work: Not needed If you have labs (blood work) drawn today and your tests are completely normal, you will receive your results only by: Marland Kitchen MyChart Message (if you have MyChart) OR . A paper copy in the mail If you have any lab test that is abnormal or we need to change your treatment, we will call you to review the results.  Testing/Procedures: Not needed  Follow-Up: At [facility], you and your health needs are our priority.  As part of our continuing mission to provide you with exceptional heart care, we have created designated Provider Care Teams.  These Care Teams include your primary Cardiologist (physician) and Advanced Practice Providers (APPs -  Physician Assistants and Nurse Practitioners) who all work together to provide you with the care you need, when you need it. You will need a follow up appointment in 12 months dec 2020.  Please call our office 2 months in advance to schedule this appointment.  You may see Glenetta Hew, MD or one of the following Advanced Practice Providers on your designated Care Team:   Rosaria Ferries, PA-C . Jory Sims, DNP, ANP-- Your physician recommends that you schedule a follow-up appointment in 6 month visit .   Any Other Special Instructions Will Be Listed Below (If Applicable).    if symptoms comes back - irregular heart rate and it last more than 2 hours contact OFFICE or go to ER FOR EVAULATION

## 2018-03-25 ENCOUNTER — Encounter: Payer: Self-pay | Admitting: Cardiology

## 2018-03-25 NOTE — Assessment & Plan Note (Signed)
Probably has brief episodes of PAF as well as PSVT.  But has not really seem to having anything more than brief flutter episodes of probably PACs and PVCs may be short bursts of PSVT.  Does not sound like A. fib. She is on a stable dose of carvedilol tolerating it well.  Is also [name] with no bleeding issues.

## 2018-03-25 NOTE — Assessment & Plan Note (Signed)
Labs from October look great.  On stable dose of statin.  No change.

## 2018-03-25 NOTE — Assessment & Plan Note (Signed)
The patient understands the need to lose weight with diet and exercise. We have discussed specific strategies for this.  

## 2018-03-25 NOTE — Assessment & Plan Note (Signed)
EF has improved and not really having any true heart failure symptoms.  He is on carvedilol and losartan at good doses.  Relatively euvolemic on her current low-dose of Lasix.  I recommended that she can just take it 40 mg once a day as opposed to 20 mg twice daily.

## 2018-03-25 NOTE — Assessment & Plan Note (Signed)
Probably short little bursts.  Not long enough to consider Wegener's, however they do last more than a couple minutes I did recommend she try vagal maneuvers.  If there is an episode that is prolonged, she can easily come to the emergency room for for evaluation and management of either PSVT or PAF.

## 2018-03-25 NOTE — Assessment & Plan Note (Signed)
Stable blood pressure on current meds.  No change. 

## 2018-03-25 NOTE — Assessment & Plan Note (Signed)
Euvolemic on current dose of Lasix.  On stable dose of carvedilol and losartan.

## 2018-03-28 ENCOUNTER — Other Ambulatory Visit: Payer: Self-pay | Admitting: Internal Medicine

## 2018-03-29 ENCOUNTER — Other Ambulatory Visit: Payer: Self-pay | Admitting: Internal Medicine

## 2018-04-12 ENCOUNTER — Other Ambulatory Visit: Payer: Self-pay | Admitting: Oncology

## 2018-04-12 DIAGNOSIS — Z853 Personal history of malignant neoplasm of breast: Secondary | ICD-10-CM

## 2018-04-21 ENCOUNTER — Other Ambulatory Visit: Payer: Self-pay | Admitting: Internal Medicine

## 2018-04-23 ENCOUNTER — Encounter: Payer: Self-pay | Admitting: Internal Medicine

## 2018-04-23 ENCOUNTER — Ambulatory Visit: Payer: Medicare Other | Admitting: Internal Medicine

## 2018-04-23 VITALS — BP 124/72 | HR 64 | Temp 97.5°F | Resp 16 | Ht 64.5 in | Wt 230.0 lb

## 2018-04-23 DIAGNOSIS — E782 Mixed hyperlipidemia: Secondary | ICD-10-CM | POA: Diagnosis not present

## 2018-04-23 DIAGNOSIS — E039 Hypothyroidism, unspecified: Secondary | ICD-10-CM

## 2018-04-23 DIAGNOSIS — I10 Essential (primary) hypertension: Secondary | ICD-10-CM | POA: Diagnosis not present

## 2018-04-23 DIAGNOSIS — E559 Vitamin D deficiency, unspecified: Secondary | ICD-10-CM | POA: Diagnosis not present

## 2018-04-23 DIAGNOSIS — K219 Gastro-esophageal reflux disease without esophagitis: Secondary | ICD-10-CM

## 2018-04-23 DIAGNOSIS — Z6838 Body mass index (BMI) 38.0-38.9, adult: Secondary | ICD-10-CM

## 2018-04-23 DIAGNOSIS — R7309 Other abnormal glucose: Secondary | ICD-10-CM

## 2018-04-23 DIAGNOSIS — Z79899 Other long term (current) drug therapy: Secondary | ICD-10-CM

## 2018-04-23 DIAGNOSIS — I480 Paroxysmal atrial fibrillation: Secondary | ICD-10-CM

## 2018-04-23 DIAGNOSIS — R7303 Prediabetes: Secondary | ICD-10-CM

## 2018-04-23 MED ORDER — PHENTERMINE HCL 37.5 MG PO TABS: ORAL_TABLET | ORAL | 5 refills | Status: DC

## 2018-04-23 NOTE — Progress Notes (Signed)
This very nice 73 y.o. MWF presents for 3 month follow up with HTN, HLD, Pre-Diabetes and Vitamin D Deficiency.      Patient is treated for HTN & BP has been controlled at home. Today's BP is at goal - 124/72.  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  Patient has had no complaints of any cardiac type chest pain, palpitations, dyspnea / orthopnea / PND, dizziness, claudication, or dependent edema.     Hyperlipidemia is controlled with diet & meds. Patient denies myalgias or other med SE's. Last Lipids were at goal albeit elevated Trig's: Lab Results  Component Value Date   CHOL 144 01/16/2018   HDL 48 (L) 01/16/2018   LDLCALC 71 01/16/2018   TRIG 172 (H) 01/16/2018   CHOLHDL 3.0 01/16/2018      Also, the patient has Morbid Obesity (BMI 38+) and history of  PreDiabetes   (A1c 6.3% / June 2017) and has had no symptoms of reactive hypoglycemia, diabetic polys, paresthesias or visual blurring.  Last A1c was still not at goal: Lab Results  Component Value Date   HGBA1C 6.2 (H) 01/16/2018      Patient was dx'd Hypothyroid Mar 2016 and initiated on Thyroid replacement.      Further, the patient also has history of Vitamin D Deficiency ("13" / 2016)  and supplements vitamin D without any suspected side-effects. Last vitamin D was at goal: Lab Results  Component Value Date   VD25OH 102 (H) 01/16/2018   Current Outpatient Medications on File Prior to Visit  Medication Sig  . anastrozole (ARIMIDEX) 1 MG tablet Take 1 tablet (1 mg total) by mouth daily.  Marland Kitchen buPROPion (WELLBUTRIN XL) 150 MG 24 hr tablet TAKE 1 TABLET BY MOUTH EVERY DAY IN THE MORNING  . carvedilol (COREG) 25 MG tablet TAKE 1 TABLET TWICE A DAY OR AS DIRECTED  . Cholecalciferol (VITAMIN D) 2000 UNITS tablet Take 4,000 Units by  mouth daily.  . Coenzyme Q10 (COQ10) 100 MG CAPS Take 300 mg by mouth daily.  . cyclobenzaprine (FLEXERIL) 10 MG tablet Take 10 mg by mouth at bedtime as needed (FOR RESTLESS LEGS).   Marland Kitchen docusate sodium (COLACE) 100 MG capsule Take 1 capsule (100 mg total) by mouth 2 (two) times daily.  . DULoxetine (CYMBALTA) 30 MG capsule Take 1 capsule (30 mg total) by mouth 3 (three) times daily.  Marland Kitchen ELIQUIS 5 MG TABS tablet TAKE 1 TABLET BY MOUTH TWICE A DAY  . ferrous sulfate 325 (65 FE) MG tablet TAKE 1 TABLET (325 MG TOTAL) BY MOUTH 2 (TWO) TIMES DAILY WITH A MEAL.  . furosemide (LASIX) 20 MG tablet TAKE 1 TABLET BY MOUTH TWICE A DAY  . gabapentin (NEURONTIN) 300 MG capsule TAKE ONE CAPSULE BY MOUTH EVERY DAY AT BEDTIME  . levothyroxine (SYNTHROID, LEVOTHROID) 50 MCG tablet TAKE 1/2 TO 1 TABLET BY MOUTH ON AN EMPTY STOMCH 30 TO 60 MINUTES BEFORE BREAKFAST  . losartan (COZAAR) 100 MG tablet TAKE 1 TABLET BY MOUTH EVERY DAY REPLACES VALSARTAN  . meloxicam (MOBIC) 15 MG tablet TAKE 1/2 TO 1 TABLET DAILY WITH FOOD FOR PAIN *DONT TAKE WITH ALEVE OR IBUPROFEN, CAN TAKE TYLENOL*  . mometasone (NASONEX) 50 MCG/ACT nasal spray USE 1 TO 2 SPRAYS IN EACH NOSTRIL TWICE DAILY  .  omeprazole (PRILOSEC) 40 MG capsule TAKE 1 CAPSULE BY MOUTH EVERY DAY  . rosuvastatin (CRESTOR) 40 MG tablet TAKE 40 MG EVERYDAY EXCEPT ON MONDAYS ,TAKE 80 MG BY MOUTH DAILY  . temazepam (RESTORIL) 30 MG capsule Take 1 capsule at Bedtime ONLY if needed  . traMADol (ULTRAM) 50 MG tablet Take 1 tablet (50 mg total) by mouth every 12 (twelve) hours as needed.  Marland Kitchen ipratropium (ATROVENT) 0.03 % nasal spray Place 2 sprays into the nose 3 (three) times daily.   No current facility-administered medications on file prior to visit.    Allergies  Allergen Reactions  . Requip [Ropinirole Hcl] Shortness Of Breath and Nausea And Vomiting  . Minocycline Hives  . Tetracyclines & Related Hives  . Zestril [Lisinopril] Cough  . Zetia [Ezetimibe] Other (See  Comments)    MYALGIA JOINT PAIN  . Amoxicillin Rash  . Codeine Other (See Comments)    fatigue  . Morphine Sulfate Other (See Comments)    Palpitations  . Sulfa Antibiotics Other (See Comments)    Headache (pt states that a blood pressure medicine caused a severe headache but doesn't remember a reaction to sulfa)   PMHx:   Past Medical History:  Diagnosis Date  . Allergy    Nasonex daily as needed  . Anemia   . Anxiety   . Arthritis   . Breast cancer ([place])     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  . Chronic back pain    cyst sitting on L4-5;slipped disc  . Depression    takes Cymbalta daily  . Diverticulosis   . Dysrhythmia   . GERD (gastroesophageal reflux disease)    takes Omeprazole daily  . Headache(784.0)    rare  . History of gastric ulcer at age 69  . History of shingles   . Hyperlipidemia   . Hypertension    takes Cardura nightly and Verapamil daily  . Hypothyroidism   . Insomnia    takes Restoril nightly  . Joint pain   . Nonischemic cardiomyopathy ([place]) 07/2015   Normal coronary arteries by cath. EF was 25-30% by echo. GR2 DD - likely related to LBBB in setting of A. fib  . Obesity (BMI 30-39.9) 06/11/2013  . Paroxysmal atrial fibrillation ([place]): CHA2DS2-VASc Score - starting Eliquis 08/06/2015   This patients CHA2DS2-VASc Score and unadjusted Ischemic Stroke Rate (% per year) is equal to 4.8 % stroke rate/year from a score of 4  Above score calculated as 1 point each if present [CHF, HTN, DM, Vascular=MI/PAD/Aortic Plaque, Age if 65-74, or Female]; 2 points each if present [Age > 75, or Stroke/TIA/TE]  . PONV (postoperative nausea and vomiting)   . S/P radiation therapy 07/18/2013-08/28/2013   1) Left Breast / 50 Gy in 25 fractions/ 2) Left Breast Boost / 10 Gy in 5 fractions  . Shingles   . Sinus drainage    put on Levaquin yesterday if no better in 3 days will start prednisone  . Thyroid disease   . Weakness    tingling and numbness  both hands and left leg  . Wears glasses    Immunization History  Administered Date(s) Administered  . Influenza Whole 01/01/2013  . Influenza, High Dose Seasonal PF 01/29/2014, 12/30/2014  . Influenza-Unspecified 12/11/2015, 02/04/2017, 01/18/2018  . Pneumococcal Conjugate-13 06/16/2014  . Pneumococcal Polysaccharide-23 05/23/2011  . Tdap 03/11/2008  . Zoster 02/02/2010   Past Surgical History:  Procedure Laterality Date  . ABDOMINAL HYSTERECTOMY    . BREAST LUMPECTOMY  Left    neg  . CARDIAC CATHETERIZATION N/A 08/10/2015   Procedure: Right/Left Heart Cath and Coronary Angiography;  Surgeon: Burnell Blanks, MD;  Location: [facility];  Service: Cardiovascular;: Nonobstructive CAD  . CATARACT EXTRACTION BILATERAL W/ ANTERIOR VITRECTOMY Bilateral   . CHOLECYSTECTOMY    . COLONOSCOPY  2014  . DUPUYTREN / PALMAR FASCIOTOMY Bilateral 2007   x 3 to left and once to the right   . epidural injections     x 3  . FOOT TENDON SURGERY Right ~ 11/2015   "stepped in a hole and tore 2 tendons"  . JOINT REPLACEMENT    . KNEE ARTHROSCOPY Bilateral   . LUMBAR FUSION  10/2013   Dr. Lynann Bologna  . TONSILLECTOMY    . TOTAL KNEE ARTHROPLASTY Left 09/23/2016  . TOTAL KNEE ARTHROPLASTY Left 09/23/2016   Procedure: TOTAL KNEE ARTHROPLASTY;  Surgeon: Dorna Leitz, MD;  Location: [facility];  Service: Orthopedics;  Laterality: Left;  . TRANSTHORACIC ECHOCARDIOGRAM  08/07/2015   Severely reduced LVEF at 25-30% with diffuse HK. GR 2 DD. Ventricular dyssynergy secondary to LBBB. Small to moderate pericardial effusion but no hemodynamic compromise  . TRANSTHORACIC ECHOCARDIOGRAM  10/2015    (EF up from 25-30%): - Left ventricle: Poor acoustic windows -  difficult to see.    EF estimated at 45-50% with inferoseptal and possible posterior hypokinesis. GR 1 DD. No pericardial effusion.  . TUBAL LIGATION    . UPPER GI ENDOSCOPY     FHx:    Reviewed / unchanged  SHx:    Reviewed / unchanged   Systems  Review:  Constitutional: Denies fever, chills, wt changes, headaches, insomnia, fatigue, night sweats, change in appetite. Eyes: Denies redness, blurred vision, diplopia, discharge, itchy, watery eyes.  ENT: Denies discharge, congestion, post nasal drip, epistaxis, sore throat, earache, hearing loss, dental pain, tinnitus, vertigo, sinus pain, snoring.  CV: Denies chest pain, palpitations, irregular heartbeat, syncope, dyspnea, diaphoresis, orthopnea, PND, claudication or edema. Respiratory: denies cough, dyspnea, DOE, pleurisy, hoarseness, laryngitis, wheezing.  Gastrointestinal: Denies dysphagia, odynophagia, heartburn, reflux, water brash, abdominal pain or cramps, nausea, vomiting, bloating, diarrhea, constipation, hematemesis, melena, hematochezia  or hemorrhoids. Genitourinary: Denies dysuria, frequency, urgency, nocturia, hesitancy, discharge, hematuria or flank pain. Musculoskeletal: Denies arthralgias, myalgias, stiffness, jt. swelling, pain, limping or strain/sprain.  Skin: Denies pruritus, rash, hives, warts, acne, eczema or change in skin lesion(s). Neuro: No weakness, tremor, incoordination, spasms, paresthesia or pain. Psychiatric: Denies confusion, memory loss or sensory loss. Endo: Denies change in weight, skin or hair change.  Heme/Lymph: No excessive bleeding, bruising or enlarged lymph nodes.  Physical Exam  BP 124/72   Pulse 64   Temp (!) 97.5 F (36.4 C)   Resp 16   Ht 5' 4.5" (1.638 m)   Wt 230 lb (104.3 kg)   BMI 38.87 kg/m   Appears  Over nourished, well groomed  and in no distress.  Eyes: PERRLA, EOMs, conjunctiva no swelling or erythema. Sinuses: No frontal/maxillary tenderness ENT/Mouth: EAC's clear, TM's nl w/o erythema, bulging. Nares clear w/o erythema, swelling, exudates. Oropharynx clear without erythema or exudates. Oral hygiene is good. Tongue normal, non obstructing. Hearing intact.  Neck: Supple. Thyroid not palpable. Car 2+/2+ without bruits,  nodes or JVD. Chest: Respirations nl with BS clear & equal w/o rales, rhonchi, wheezing or stridor.  Cor: Heart sounds normal w/ regular rate and rhythm without sig. murmurs, gallops, clicks or rubs. Peripheral pulses normal and equal  without edema.  Abdomen: Soft &  bowel sounds normal. Non-tender w/o guarding, rebound, hernias, masses or organomegaly.  Lymphatics: Unremarkable.  Musculoskeletal: Full ROM all peripheral extremities, joint stability, 5/5 strength and normal gait.  Skin: Warm, dry without exposed rashes, lesions or ecchymosis apparent.  Neuro: Cranial nerves intact, reflexes equal bilaterally. Sensory-motor testing grossly intact. Tendon reflexes grossly intact.  Pysch: Alert & oriented x 3.  Insight and judgement nl & appropriate. No ideations.  Assessment and Plan:  1. Essential hypertension  - Continue medication, monitor blood pressure at home.  - Continue DASH diet.  Reminder to go to the ER if any CP,  SOB, nausea, dizziness, severe HA, changes vision/speech.  - CBC with Differential/Platelet - COMPLETE METABOLIC PANEL WITH GFR - Magnesium - TSH  2. Hyperlipidemia, mixed  - Continue diet/meds, exercise,& lifestyle modifications.  - Continue monitor periodic cholesterol/liver & renal functions   - Lipid panel - TSH  3. Abnormal glucose  - Continue diet, exercise  - Lifestyle modifications.  - Monitor appropriate labs.  - Hemoglobin A1c - Insulin, random  4. Vitamin D deficiency  - Continue supplementation.  - VITAMIN D 25 Hydroxyl  5. Prediabetes  - Hemoglobin A1c - Insulin, random  6. Paroxysmal atrial fibrillation (HCC)  - TSH  7. Hypothyroidism, unspecified type  - TSH  8. Gastroesophageal reflux disease without esophagitis  - CBC with Differential/Platelet  9. Class 2 severe obesity due to excess calories with serious comorbidity and body mass index (BMI) of 38.0 to 38.9 in adult [facility])  - Long discussion re: particulars of  weight loss diet and principles of eating healthy & recc Dr Lear Ng book  "The End Of Dieting".   - phentermine (ADIPEX-P) 37.5 MG tablet; Take 1/2 to 1 tablet every morning for Dieting & Weight  Loss  Dispense: 30 tablet; Refill: 5  10. Medication management  - CBC with Differential/Platelet - COMPLETE METABOLIC PANEL WITH GFR - Magnesium - Lipid panel - TSH - Hemoglobin A1c - Insulin, random - VITAMIN D 25 Hydroxyl        Discussed  regular exercise, BP monitoring, weight control to achieve/maintain BMI less than 25 and discussed med and SE's. Recommended labs to assess and monitor clinical status with further disposition pending results of labs. Over 30 minutes of exam, counseling, chart review was performed.

## 2018-04-23 NOTE — Patient Instructions (Signed)

## 2018-04-24 LAB — CBC WITH DIFFERENTIAL/PLATELET
Absolute Monocytes: 601 cells/uL (ref 200–950)
Basophils Absolute: 78 cells/uL (ref 0–200)
Basophils Relative: 1 %
Eosinophils Absolute: 109 cells/uL (ref 15–500)
Eosinophils Relative: 1.4 %
HCT: 29.8 % — ABNORMAL LOW (ref 35.0–45.0)
Hemoglobin: 10 g/dL — ABNORMAL LOW (ref 11.7–15.5)
Lymphs Abs: 1599 cells/uL (ref 850–3900)
MCH: 30 pg (ref 27.0–33.0)
MCHC: 33.6 g/dL (ref 32.0–36.0)
MCV: 89.5 fL (ref 80.0–100.0)
MPV: 11.1 fL (ref 7.5–12.5)
Monocytes Relative: 7.7 %
NEUTROS PCT: 69.4 %
Neutro Abs: 5413 cells/uL (ref 1500–7800)
Platelets: 276 10*3/uL (ref 140–400)
RBC: 3.33 10*6/uL — AB (ref 3.80–5.10)
RDW: 13.1 % (ref 11.0–15.0)
Total Lymphocyte: 20.5 %
WBC: 7.8 10*3/uL (ref 3.8–10.8)

## 2018-04-24 LAB — COMPLETE METABOLIC PANEL WITH GFR
AG Ratio: 1.6 (calc) (ref 1.0–2.5)
ALT: 12 U/L (ref 6–29)
AST: 16 U/L (ref 10–35)
Albumin: 3.9 g/dL (ref 3.6–5.1)
Alkaline phosphatase (APISO): 81 U/L (ref 33–130)
BILIRUBIN TOTAL: 0.3 mg/dL (ref 0.2–1.2)
BUN/Creatinine Ratio: 18 (calc) (ref 6–22)
BUN: 21 mg/dL (ref 7–25)
CO2: 28 mmol/L (ref 20–32)
Calcium: 9.3 mg/dL (ref 8.6–10.4)
Chloride: 103 mmol/L (ref 98–110)
Creat: 1.19 mg/dL — ABNORMAL HIGH (ref 0.60–0.93)
GFR, EST AFRICAN AMERICAN: 53 mL/min/{1.73_m2} — AB (ref >=60)
GFR, Est Non African American: 46 mL/min/{1.73_m2} — ABNORMAL LOW (ref >=60)
Globulin: 2.5 g/dL (calc) (ref 1.9–3.7)
Glucose, Bld: 91 mg/dL (ref 65–99)
Potassium: 4 mmol/L (ref 3.5–5.3)
Sodium: 138 mmol/L (ref 135–146)
TOTAL PROTEIN: 6.4 g/dL (ref 6.1–8.1)

## 2018-04-24 LAB — INSULIN, RANDOM: Insulin: 17.7 u[IU]/mL (ref 2.0–19.6)

## 2018-04-24 LAB — HEMOGLOBIN A1C
Hgb A1c MFr Bld: 6.2 % of total Hgb — ABNORMAL HIGH (ref <5.7)
Mean Plasma Glucose: 131 (calc)
eAG (mmol/L): 7.3 (calc)

## 2018-04-24 LAB — LIPID PANEL
Cholesterol: 139 mg/dL (ref <200)
HDL: 53 mg/dL (ref >50)
LDL Cholesterol (Calc): 64 mg/dL (calc)
Non-HDL Cholesterol (Calc): 86 mg/dL (calc) (ref <130)
Total CHOL/HDL Ratio: 2.6 (calc) (ref <5.0)
Triglycerides: 137 mg/dL (ref <150)

## 2018-04-24 LAB — MAGNESIUM: Magnesium: 2.4 mg/dL (ref 1.5–2.5)

## 2018-04-24 LAB — TSH: TSH: 1.71 mIU/L (ref 0.40–4.50)

## 2018-04-24 LAB — VITAMIN D 25 HYDROXY (VIT D DEFICIENCY, FRACTURES): Vit D, 25-Hydroxy: 82 ng/mL (ref 30–100)

## 2018-05-04 ENCOUNTER — Other Ambulatory Visit: Payer: Self-pay | Admitting: Internal Medicine

## 2018-05-04 DIAGNOSIS — I5042 Chronic combined systolic (congestive) and diastolic (congestive) heart failure: Secondary | ICD-10-CM

## 2018-05-04 DIAGNOSIS — I480 Paroxysmal atrial fibrillation: Secondary | ICD-10-CM

## 2018-05-04 DIAGNOSIS — I428 Other cardiomyopathies: Secondary | ICD-10-CM

## 2018-05-04 DIAGNOSIS — I10 Essential (primary) hypertension: Secondary | ICD-10-CM

## 2018-05-25 ENCOUNTER — Other Ambulatory Visit: Payer: Self-pay | Admitting: Cardiology

## 2018-05-29 ENCOUNTER — Other Ambulatory Visit: Payer: Self-pay | Admitting: Internal Medicine

## 2018-05-30 ENCOUNTER — Ambulatory Visit (INDEPENDENT_AMBULATORY_CARE_PROVIDER_SITE_OTHER): Payer: Medicare Other

## 2018-05-30 ENCOUNTER — Encounter: Payer: Self-pay | Admitting: Podiatry

## 2018-05-30 ENCOUNTER — Ambulatory Visit: Payer: Medicare Other | Admitting: Podiatry

## 2018-05-30 ENCOUNTER — Other Ambulatory Visit: Payer: Self-pay | Admitting: Podiatry

## 2018-05-30 VITALS — BP 109/57

## 2018-05-30 DIAGNOSIS — M779 Enthesopathy, unspecified: Secondary | ICD-10-CM

## 2018-05-30 DIAGNOSIS — M79672 Pain in left foot: Secondary | ICD-10-CM

## 2018-05-30 MED ORDER — TRIAMCINOLONE ACETONIDE 10 MG/ML IJ SUSP
10.0000 mg | Freq: Once | INTRAMUSCULAR | Status: AC
  Administered 2018-05-30: 10 mg

## 2018-05-31 ENCOUNTER — Telehealth: Payer: Self-pay | Admitting: *Deleted

## 2018-05-31 ENCOUNTER — Other Ambulatory Visit: Payer: Self-pay | Admitting: Internal Medicine

## 2018-05-31 MED ORDER — AZITHROMYCIN 250 MG PO TABS: ORAL_TABLET | ORAL | 1 refills | Status: DC

## 2018-05-31 MED ORDER — PREDNISONE 20 MG PO TABS: ORAL_TABLET | ORAL | 0 refills | Status: DC

## 2018-05-31 NOTE — Telephone Encounter (Signed)
Patient called and reported she is having headache, sinus pressure and drainage and cough x 3 days.  Patient was informed RX for  medications have been sent to her pharmacy, by Dr Melford Aase.

## 2018-06-04 ENCOUNTER — Other Ambulatory Visit: Payer: Self-pay | Admitting: Oncology

## 2018-06-04 NOTE — Progress Notes (Signed)
Subjective:   Patient ID: [name] [name], female   DOB: 73 y.o.   MRN: [id number]   HPI Patient presents stating she has a lot of pain underneath her left foot and does not remember injury and states it is been going on now for a while and is worsened over the last couple months.  Patient states that she is tried different treatment options without relief so far and she does not smoke and likes to be active   Review of Systems  All other systems reviewed and are negative.       Objective:  Physical Exam Vitals signs and nursing note reviewed.  Constitutional:      Appearance: She is well-developed.  Pulmonary:     Effort: Pulmonary effort is normal.  Musculoskeletal: Normal range of motion.  Skin:    General: Skin is warm.  Neurological:     Mental Status: She is alert.     Neurovascular status found to be intact muscle strength was found to be adequate range of motion within normal limits with patient noted to have inflammation pain of the sub-first metatarsal left that has fluid buildup and is painful with palpation.  Patient does have normal range of motion with no crepitus to the joint upon movement     Assessment:  Inflammatory capsulitis first MPJ left with fluid buildup along with possibility for sesamoidal irritation or other pathology     Plan:  H&P education rendered concerning condition.  At this point I have recommended careful injection to reduce inflammation along with padding to reduce pressure against the joint surface.  Patient will be seen back to recheck again in the next several weeks and is encouraged to wear supportive shoes and not go barefoot  X-ray indicates that there is no signs of the sesamoidal fracture with a bipartite sesamoid but it appears this is more of an older issue versus a new acute condition

## 2018-06-07 ENCOUNTER — Other Ambulatory Visit: Payer: Self-pay | Admitting: Physical Medicine and Rehabilitation

## 2018-06-08 ENCOUNTER — Ambulatory Visit
Admission: RE | Admit: 2018-06-08 | Discharge: 2018-06-08 | Disposition: A | Discharge status: Home / Self Care | Payer: Medicare Other | Source: Ambulatory Visit | Attending: Oncology | Admitting: Oncology

## 2018-06-08 DIAGNOSIS — I471 Supraventricular tachycardia: Secondary | ICD-10-CM

## 2018-06-08 DIAGNOSIS — Z170 Estrogen receptor positive status [ER+]: Principal | ICD-10-CM

## 2018-06-08 DIAGNOSIS — I5042 Chronic combined systolic (congestive) and diastolic (congestive) heart failure: Secondary | ICD-10-CM

## 2018-06-08 DIAGNOSIS — Z853 Personal history of malignant neoplasm of breast: Secondary | ICD-10-CM

## 2018-06-08 DIAGNOSIS — C50312 Malignant neoplasm of lower-inner quadrant of left female breast: Secondary | ICD-10-CM

## 2018-06-13 ENCOUNTER — Ambulatory Visit: Payer: Medicare Other | Admitting: Podiatry

## 2018-06-13 ENCOUNTER — Encounter: Payer: Self-pay | Admitting: Podiatry

## 2018-06-13 ENCOUNTER — Other Ambulatory Visit: Payer: Self-pay | Admitting: Physician Assistant

## 2018-06-13 DIAGNOSIS — M779 Enthesopathy, unspecified: Secondary | ICD-10-CM | POA: Diagnosis not present

## 2018-06-13 DIAGNOSIS — M79672 Pain in left foot: Secondary | ICD-10-CM

## 2018-06-13 NOTE — Progress Notes (Signed)
Subjective:   Patient ID: [name] [name], female   DOB: 73 y.o.   MRN: [id number]   HPI Patient states I am improved but I am still having some pain on top of the joint   ROS      Objective:  Physical Exam  Neurovascular status intact with significant reduction plantar inflammation swelling left first MPJ with mild discomfort on the lateral side of the joint surface and mild bunion deformity     Assessment:  Doing much better for inflamed plantar sesamoidal complex left with mild dorsal pain     Plan:  H&P condition reviewed recommended continued dancer pad application along with supportive shoe and if the swelling or symptoms were to come back we will need to consider MRI for this

## 2018-06-18 ENCOUNTER — Other Ambulatory Visit: Payer: Self-pay | Admitting: Internal Medicine

## 2018-06-18 ENCOUNTER — Telehealth: Payer: Self-pay | Admitting: Cardiology

## 2018-06-18 MED ORDER — AZITHROMYCIN 250 MG PO TABS: ORAL_TABLET | ORAL | 1 refills | Status: DC

## 2018-06-18 NOTE — Telephone Encounter (Signed)
Pt called she is having dizziness when she goes from sitting to standing, pt stated that she has had something like this before if she would bend over, pt stated that she does have a sinus infection at this time and is unsure if it is her BP meds making her dizzy or the sinus infection.

## 2018-06-18 NOTE — Telephone Encounter (Signed)
New Message          Pt c/o BP issue: STAT if pt c/o blurred vision, one-sided weakness or slurred speech  1. What are your last 5 BP readings? 107/57  2. Are you having any other symptoms (ex. Dizziness, headache, blurred vision, passed out)? Very Dizzy  3. What is your BP issue? To low

## 2018-06-19 NOTE — Telephone Encounter (Signed)
Called patient, to see how she was doing today. Patient states she feels better. No dizziness . RN informed patient symptoms  Could be coming from sinus infection more than blood pressure - contact primary. Patient states she contacted primary -  Prescribe  z-pak , and she she is taking mucinex.  RN informed patient if symptoms do not get better  contact primary - patient verbalized understanding

## 2018-06-21 ENCOUNTER — Other Ambulatory Visit: Payer: Self-pay | Admitting: Physical Medicine and Rehabilitation

## 2018-06-21 DIAGNOSIS — M542 Cervicalgia: Secondary | ICD-10-CM

## 2018-07-04 ENCOUNTER — Other Ambulatory Visit: Payer: Medicare Other

## 2018-07-05 ENCOUNTER — Other Ambulatory Visit: Payer: Self-pay | Admitting: Internal Medicine

## 2018-07-11 ENCOUNTER — Encounter: Payer: Self-pay | Admitting: Podiatry

## 2018-07-11 ENCOUNTER — Ambulatory Visit: Payer: Medicare Other | Admitting: Podiatry

## 2018-07-11 ENCOUNTER — Other Ambulatory Visit: Payer: Self-pay

## 2018-07-11 VITALS — Temp 98.9°F

## 2018-07-11 DIAGNOSIS — M216X9 Other acquired deformities of unspecified foot: Secondary | ICD-10-CM | POA: Diagnosis not present

## 2018-07-11 DIAGNOSIS — M779 Enthesopathy, unspecified: Secondary | ICD-10-CM | POA: Diagnosis not present

## 2018-07-11 DIAGNOSIS — M79672 Pain in left foot: Secondary | ICD-10-CM

## 2018-07-11 NOTE — Progress Notes (Signed)
Subjective:   Patient ID: [name] [name], female   DOB: 73 y.o.   MRN: [id number]   HPI Patient presents stating that it seems the swelling is going down but she is concerned about the discomfort that she still experiences which is much less than it was but still present   ROS      Objective:  Physical Exam  Neurovascular status intact with inflammation around the first metatarsal head left that improved with diminished discomfort but there is definitely a plantarflexion of the first metatarsal bone itself     Assessment:  Appears to be over the acute inflammatory process but does have structural changes     Plan:  H&P condition reviewed and discussed and at this point I have recommended continued dancer pad usage discussed orthotics possibility for surgical intervention in the future with elevating osteotomy or sesamoidectomy if symptoms were to recur.  Reappoint to recheck

## 2018-07-17 ENCOUNTER — Telehealth: Payer: Medicare Other | Admitting: Physician Assistant

## 2018-07-17 DIAGNOSIS — R42 Dizziness and giddiness: Secondary | ICD-10-CM

## 2018-07-17 MED ORDER — GABAPENTIN 300 MG PO CAPS: 300.0000 mg | ORAL_CAPSULE | Freq: Two times a day (BID) | ORAL | 1 refills | Status: DC

## 2018-07-17 NOTE — Telephone Encounter (Signed)
THIS ENCOUNTER IS A VIRTUAL VISIT DUE TO COVID-19 - PATIENT WAS NOT SEEN IN THE OFFICE.  PATIENT HAS CONSENTED TO VIRTUAL VISIT / TELEMEDICINE VISIT   Virtual Visit via telephone Note  I connected with [name] [name] on 07/17/18 by telephone.  I verified that I am speaking with the correct person using two identifiers.    I discussed the limitations of evaluation and management by telemedicine and the availability of in person appointments. The patient expressed understanding and agreed to proceed.  History of Present Illness: 73 y.o. obese WF with history of afib on elquis, CHF presents with dizziness. She states it has been intermittent for 2 months, but has been worse today, she feels she will fall over/pass out. Denies vertigo.  She reports her BP is 88/56, BP is normally 100/60. HR is 97. She is at 228.  She is being monitored by [facility] care She is on phentermine x Jan. .  She is on lasix BID, coreg 25 mg BID and losartan 100mg .  She has been taking the coreg and the wellbutrin later in the day that helps.  She has been on gabapentin BID x 6 months.  No fever, chills, diarrhea, vomiting. Will get nausea with it occ. She has been eating and drinking okay, states she will drink lots of water that helps. No sinus issues. No dark black stool, normal color urine. No muscles aches.   She has pain under right back knee.    BMI is There is no height or weight on file to calculate BMI., she is working on diet and exercise. Wt Readings from Last 3 Encounters:  04/23/18 230 lb (104.3 kg)  03/23/18 230 lb (104.3 kg)  01/16/18 225 lb 9.6 oz (102.3 kg)     Medications  Current Outpatient Medications (Endocrine & Metabolic):  .  levothyroxine (SYNTHROID, LEVOTHROID) 50 MCG tablet, TAKE 1/2 TO 1 TABLET BY MOUTH ON AN EMPTY STOMCH 30 TO 60 MINUTES BEFORE BREAKFAST .  predniSONE (DELTASONE) 20 MG tablet, 1 tab 3 x day for 3 days, then 1 tab 2 x day for 3 days, then 1 tab 1 x day for 5  days  Current Outpatient Medications (Cardiovascular):  .  carvedilol (COREG) 25 MG tablet, TAKE 1 TABLET TWICE A DAY OR AS DIRECTED .  furosemide (LASIX) 20 MG tablet, TAKE 1 TABLET BY MOUTH TWICE A DAY .  losartan (COZAAR) 100 MG tablet, TAKE 1 TABLET BY MOUTH EVERY DAY REPLACES VALSARTAN .  rosuvastatin (CRESTOR) 40 MG tablet, TAKE 1 TABLET EVERY DAY, EXCEPT TAKE 2 TABLETS ON MONDAY  Current Outpatient Medications (Respiratory):  .  ipratropium (ATROVENT) 0.03 % nasal spray, Place 2 sprays into the nose 3 (three) times daily. .  mometasone (NASONEX) 50 MCG/ACT nasal spray, USE 1 TO 2 SPRAYS IN EACH NOSTRIL TWICE DAILY  Current Outpatient Medications (Analgesics):  .  meloxicam (MOBIC) 15 MG tablet, TAKE 1/2 TO 1 TABLET DAILY WITH FOOD FOR PAIN *DONT TAKE WITH ALEVE OR IBUPROFEN, CAN TAKE TYLENOL* .  traMADol (ULTRAM) 50 MG tablet, Take 1 tablet (50 mg total) by mouth every 12 (twelve) hours as needed.  Current Outpatient Medications (Hematological):  Marland Kitchen  ELIQUIS 5 MG TABS tablet, TAKE 1 TABLET BY MOUTH TWICE A DAY .  ferrous sulfate 325 (65 FE) MG tablet, TAKE 1 TABLET (325 MG TOTAL) BY MOUTH 2 (TWO) TIMES DAILY WITH A MEAL.  Current Outpatient Medications (Other):  .  anastrozole (ARIMIDEX) 1 MG tablet, TAKE 1 TABLET  BY MOUTH EVERY DAY .  buPROPion (WELLBUTRIN XL) 150 MG 24 hr tablet, TAKE 1 TABLET BY MOUTH EVERY DAY IN THE MORNING .  chlorhexidine (PERIDEX) 0.12 % solution, SWISH 1 CAPFUL FOR 30 SECONDS TWICE A DAY *SPIT OUT AFTER USE* *START 1 WEEK PRIOR TO SURGERY* .  Cholecalciferol (VITAMIN D) 2000 UNITS tablet, Take 4,000 Units by mouth daily. .  Coenzyme Q10 (COQ10) 100 MG CAPS, Take 300 mg by mouth daily. .  cyclobenzaprine (FLEXERIL) 10 MG tablet, Take 10 mg by mouth at bedtime as needed (FOR RESTLESS LEGS).  Marland Kitchen  docusate sodium (COLACE) 100 MG capsule, Take 1 capsule (100 mg total) by mouth 2 (two) times daily. .  DULoxetine (CYMBALTA) 30 MG capsule, Take 1 capsule (30 mg  total) by mouth 3 (three) times daily. Marland Kitchen  gabapentin (NEURONTIN) 300 MG capsule, TAKE ONE CAPSULE BY MOUTH EVERY DAY AT BEDTIME .  omeprazole (PRILOSEC) 40 MG capsule, TAKE 1 CAPSULE BY MOUTH EVERY DAY .  phentermine (ADIPEX-P) 37.5 MG tablet, Take 1/2 to 1 tablet every morning for Dieting & Weight  Loss .  ranitidine (ZANTAC) 300 MG tablet, TAKE 2 TABLETS DAILY AS NEEDED FOR ACID INDIGESTION & REFLUX .  temazepam (RESTORIL) 30 MG capsule, TAKE 1 CAPSULE AT BEDTIME ONLY IF NEEDED  Problem list She has Hyperlipidemia; Depression, major, recurrent, in partial remission ([place]); Essential hypertension; H/O left bundle branch block; Allergic rhinitis; GERD; History of pancreatitis; Malignant neoplasm of lower-inner quadrant of left breast in female, estrogen receptor positive ([place]); Radiculopathy; Paroxysmal supraventricular tachycardia ([place]); Prediabetes; Vitamin D deficiency; Medication management; DDD (degenerative disc disease), lumbar; Encounter for Medicare annual wellness exam; Paroxysmal atrial fibrillation ([place]): CHA2DS2-VASc Score - starting Eliquis; Hypothyroidism; History of ulcer disease; Absolute anemia; Chronic combined systolic and diastolic heart failure, NYHA class 2 ([place]) -- exacerbated by A. fib; Non-ischemic cardiomyopathy ([place]); Primary osteoarthritis of left knee; and Morbid obesity ([place]) on their problem list.   Observations/Objective: General Appearance:Well sounding, in no apparent distress.  ENT/Mouth: No hoarseness, No cough for duration of visit.  Respiratory: completing full sentences without distress, without audible wheeze Neuro: Awake and oriented X 3,  Psych:  Insight and Judgment appropriate.    Assessment and Plan: Dizziness ? polypharmacy Stop the phentermine, stop the wellbutrin Cut back the lasix/furosemide to one pill a day alternating with 2 pills a day and monitor your weight and blood pressure.  If your weight goes up more than 3 lbs in a day go back to 2  pills a day If this does not help we may change your gabapentin.   Call back in 1 week If not better may get lab only and adjust meds further.   Follow Up Instructions:   I discussed the assessment and treatment plan with the patient. The patient was provided an opportunity to ask questions and all were answered. The patient agreed with the plan and demonstrated an understanding of the instructions.   The patient was advised to call back or seek an in-person evaluation if the symptoms worsen or if the condition fails to improve as anticipated.  I provided 25 minutes of non-face-to-face time during this encounter.   Vicie Mutters, PA-C

## 2018-07-26 ENCOUNTER — Telehealth: Payer: Medicare Other | Admitting: Physician Assistant

## 2018-07-26 DIAGNOSIS — I5042 Chronic combined systolic (congestive) and diastolic (congestive) heart failure: Secondary | ICD-10-CM | POA: Diagnosis not present

## 2018-07-26 DIAGNOSIS — I10 Essential (primary) hypertension: Secondary | ICD-10-CM | POA: Diagnosis not present

## 2018-07-26 DIAGNOSIS — L309 Dermatitis, unspecified: Secondary | ICD-10-CM | POA: Diagnosis not present

## 2018-07-26 DIAGNOSIS — R42 Dizziness and giddiness: Secondary | ICD-10-CM

## 2018-07-26 MED ORDER — TRIAMCINOLONE ACETONIDE 0.1 % EX OINT: 1.0000 "application " | TOPICAL_OINTMENT | Freq: Two times a day (BID) | CUTANEOUS | 1 refills | Status: DC

## 2018-07-26 MED ORDER — NYSTATIN 100000 UNIT/GM EX OINT: 1.0000 "application " | TOPICAL_OINTMENT | Freq: Two times a day (BID) | CUTANEOUS | 0 refills | Status: DC

## 2018-07-26 NOTE — Telephone Encounter (Signed)
THIS ENCOUNTER IS A VIRTUAL VISIT DUE TO COVID-19 - PATIENT WAS NOT SEEN IN THE OFFICE.  PATIENT HAS CONSENTED TO VIRTUAL VISIT / TELEMEDICINE VISIT   Virtual Visit via telephone Note  I connected with [name] [name] on 07/26/2018 07/26/2018  by telephone.  I verified that I am speaking with the correct person using two identifiers.    I discussed the limitations of evaluation and management by telemedicine and the availability of in person appointments. The patient expressed understanding and agreed to proceed.  History of Present Illness:  She states she has a rash on her neck x 1 week, started as 2 little spots that itched a lot, then it became one larger warm red area on left neck, put benadryl cream on it that has helped. No fever, chills. She states maybe some streaking. No fever, chills.   Her BP has been running 90-100's over 50, no dizziness after stopping the meds, she feels fine. With her history of CHF, will continue her meds the same, if she gets any symptoms we will cut her losartan in half. She reports that her dizziness has improved off the phentermine and wellbutrin, she is back up to two of the lasix a day.   Medications  Current Outpatient Medications (Endocrine & Metabolic):  .  levothyroxine (SYNTHROID, LEVOTHROID) 50 MCG tablet, TAKE 1/2 TO 1 TABLET BY MOUTH ON AN EMPTY STOMCH 30 TO 60 MINUTES BEFORE BREAKFAST  Current Outpatient Medications (Cardiovascular):  .  carvedilol (COREG) 25 MG tablet, TAKE 1 TABLET TWICE A DAY OR AS DIRECTED .  furosemide (LASIX) 20 MG tablet, TAKE 1 TABLET BY MOUTH TWICE A DAY .  losartan (COZAAR) 100 MG tablet, TAKE 1 TABLET BY MOUTH EVERY DAY REPLACES VALSARTAN .  rosuvastatin (CRESTOR) 40 MG tablet, TAKE 1 TABLET EVERY DAY, EXCEPT TAKE 2 TABLETS ON MONDAY  Current Outpatient Medications (Respiratory):  .  ipratropium (ATROVENT) 0.03 % nasal spray, Place 2 sprays into the nose 3 (three) times daily. .  mometasone (NASONEX) 50 MCG/ACT  nasal spray, USE 1 TO 2 SPRAYS IN EACH NOSTRIL TWICE DAILY   Current Outpatient Medications (Hematological):  Marland Kitchen  ELIQUIS 5 MG TABS tablet, TAKE 1 TABLET BY MOUTH TWICE A DAY .  ferrous sulfate 325 (65 FE) MG tablet, TAKE 1 TABLET (325 MG TOTAL) BY MOUTH 2 (TWO) TIMES DAILY WITH A MEAL.  Current Outpatient Medications (Other):  .  anastrozole (ARIMIDEX) 1 MG tablet, TAKE 1 TABLET BY MOUTH EVERY DAY .  buPROPion (WELLBUTRIN XL) 150 MG 24 hr tablet, TAKE 1 TABLET BY MOUTH EVERY DAY IN THE MORNING .  chlorhexidine (PERIDEX) 0.12 % solution, SWISH 1 CAPFUL FOR 30 SECONDS TWICE A DAY *SPIT OUT AFTER USE* *START 1 WEEK PRIOR TO SURGERY* .  Cholecalciferol (VITAMIN D) 2000 UNITS tablet, Take 4,000 Units by mouth daily. .  Coenzyme Q10 (COQ10) 100 MG CAPS, Take 300 mg by mouth daily. Marland Kitchen  docusate sodium (COLACE) 100 MG capsule, Take 1 capsule (100 mg total) by mouth 2 (two) times daily. .  DULoxetine (CYMBALTA) 30 MG capsule, Take 1 capsule (30 mg total) by mouth 3 (three) times daily. Marland Kitchen  gabapentin (NEURONTIN) 300 MG capsule, Take 1 capsule (300 mg total) by mouth 2 (two) times daily. Marland Kitchen  omeprazole (PRILOSEC) 40 MG capsule, TAKE 1 CAPSULE BY MOUTH EVERY DAY .  ranitidine (ZANTAC) 300 MG tablet, TAKE 2 TABLETS DAILY AS NEEDED FOR ACID INDIGESTION & REFLUX .  temazepam (RESTORIL) 30 MG capsule, TAKE 1  CAPSULE AT BEDTIME ONLY IF NEEDED  Problem list She has Hyperlipidemia; Depression, major, recurrent, in partial remission ([place]); Essential hypertension; H/O left bundle branch block; Allergic rhinitis; GERD; History of pancreatitis; Malignant neoplasm of lower-inner quadrant of left breast in female, estrogen receptor positive ([place]); Radiculopathy; Paroxysmal supraventricular tachycardia ([place]); Prediabetes; Vitamin D deficiency; Medication management; DDD (degenerative disc disease), lumbar; Encounter for Medicare annual wellness exam; Paroxysmal atrial fibrillation ([place]): CHA2DS2-VASc Score - starting  Eliquis; Hypothyroidism; History of ulcer disease; Absolute anemia; Chronic combined systolic and diastolic heart failure, NYHA class 2 ([place]) -- exacerbated by A. fib; Non-ischemic cardiomyopathy ([place]); Primary osteoarthritis of left knee; and Morbid obesity ([place]) on their problem list.   Observations/Objective: General Appearance:Well sounding, in no apparent distress.  ENT/Mouth: No hoarseness, No cough for duration of visit.  Respiratory: completing full sentences without distress, without audible wheeze Neuro: Awake and oriented X 3,  Psych:  Insight and Judgment appropriate.   Assessment and Plan: Dizziness Improved off medications phentermine and wellbutrin  Essential hypertension Want a lower BP, if she can handle it.  Goal 100/60 continue to monitor If she has any symptoms such as dizziness cut cozaar in half  Chronic combined systolic and diastolic heart failure, NYHA class 2 ([place]) -- exacerbated by A. Fib Continue to monitor weight and BP Follow up cardiology  Dermatitis Sent in picture on text Shows erythematous area at left fold of neck, no streaking, no swelling visible.  Will treat topically, will contact us if not better -     triamcinolone ointment (KENALOG) 0.1 %; Apply 1 application topically 2 (two) times daily. -     nystatin ointment (MYCOSTATIN); Apply 1 application topically 2 (two) times daily.     Follow Up Instructions:    I discussed the assessment and treatment plan with the patient. The patient was provided an opportunity to ask questions and all were answered. The patient agreed with the plan and demonstrated an understanding of the instructions.   The patient was advised to call back or seek an in-person evaluation if the symptoms worsen or if the condition fails to improve as anticipated.  I provided 20 minutes of non-face-to-face time during this encounter.   Vicie Mutters, PA-C

## 2018-08-02 ENCOUNTER — Other Ambulatory Visit: Payer: Self-pay | Admitting: Cardiology

## 2018-08-13 ENCOUNTER — Telehealth: Payer: Self-pay | Admitting: Oncology

## 2018-08-13 NOTE — Telephone Encounter (Signed)
Called regarding upcoming Webex appointment, screen run complete and e-mail sent.

## 2018-08-15 NOTE — Telephone Encounter (Signed)
Opened on accident. Please disregard this.

## 2018-08-15 NOTE — Progress Notes (Signed)
[facility]  Telephone:([phone]) [phone] Fax:(336) [phone]     ID: DUHA ABAIR DOB: 06/07/1945  MR#: [id number]  OAC#:[id number]  Patient Care Team: Unk Pinto, MD as PCP - General (Internal Medicine) Leonie Man, MD as PCP - Cardiology (Cardiology) Svara Twyman, Virgie Dad, MD as Consulting Physician (Oncology) Leonie Man, MD as Consulting Physician (Cardiology) Dorna Leitz, MD as Consulting Physician (Orthopedic Surgery) Rolm Bookbinder, MD as Consulting Physician (General Surgery) Eppie Gibson, MD as Attending Physician (Radiation Oncology) OTHER MD:   I connected with [name] on 08/16/18 at  2:30 PM EDT by video enabled telemedicine visit and verified that I am speaking with the correct person using two identifiers.   I discussed the limitations, risks, security and privacy concerns of performing an evaluation and management service by telemedicine and the availability of in-person appointments. I also discussed with the patient that there may be a patient responsible charge related to this service. The patient expressed understanding and agreed to proceed.   Other persons participating in the visit and their role in the encounter: Wilburn Mylar, scribe   Patients location: home  Providers location: [place]: Estrogen receptor positive breast cancer  CURRENT TREATMENT: Anastrozole   INTERVAL HISTORY: [name] [name] is seen today for follow-up of her estrogen receptor positive breast cancer.    She continues on anastrozole, which she tolerates well. She notes her hot flashes are worse in the summer. She denies vaginal dryness.  Since her last visit, she underwent bone density and routine mammogram on 06/08/2018. Bone density testing showed a T-score of -1.7, which is considered osteopenic. Diagnostic bilateral breast mammogram showed: breast density category B; no evidence of malignancy in either  breast.   REVIEW OF SYSTEMS: [name] reports doing well overall. She notes being more sore than she used to be. She continues to work outside the home. 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 She does her own shopping and cooking. The patient denies unusual headaches, visual changes, nausea, vomiting, stiff neck, dizziness, or gait imbalance. There has been no cough, phlegm production, or pleurisy, no chest pain or pressure, and no change in bowel or bladder habits. The patient denies fever, rash, bleeding, unexplained fatigue or unexplained weight loss. A detailed review of systems was otherwise entirely negative.   BREAST CANCER HISTORY: From doctor Khan's earlier note:  "ELANDA GARMANY is a 73 y.o. female with  1. A past medical history significant for anxiety GERD shingles anemia and hypertension. Patient also has a family history significant for breast cancer in a sister. Patient underwent a screening mammogram that showed a breast density category C. She was noted to have a mass in the lower inner quadrant of the left breast with spiculated margins and associated microcalcifications. This measures 10 mm. She had an ultrasound performed that showed irregular hypoechoic mass at the 9:00 position 67 cm from the nipple. This measured 5 x 4 x 5 mm. She had a core needle biopsy performed that revealed invasive lobular carcinoma, grade 2, tumor was estrogen receptor positive progesterone receptor positive HER-2/neu negative with a proliferation marker Ki-67 14%. Patient was seen by Dr. Rolm Bookbinder on 05/22/2013 to discuss surgical treatment options. Since then she underwent a left breast radioactive seed guided lumpectomy and axillary sentinel lymph node biopsy. Her final pathology did reveal a 1.8 cm, grade 1, invasive lobular carcinoma Sentinel node was negative  for metastatic disease all margins were clear. Tumor again was ER positive  PR positive HER-2/neu negative. She overall tolerated the procedure well.  2. Patient underwent adjuvant radiation therapy under the care of Dr. Isidore Moos from 07/18/13 through 08/28/13."  Her subsequent history is as detailed below   PAST MEDICAL HISTORY: Past Medical History:  Diagnosis Date   Allergy    Nasonex daily as needed   Anemia    Anxiety    Arthritis    Breast cancer ([place])     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   Chronic back pain    cyst sitting on L4-5;slipped disc   Depression    takes Cymbalta daily   Diverticulosis    Dysrhythmia    GERD (gastroesophageal reflux disease)    takes Omeprazole daily   Headache(784.0)    rare   History of gastric ulcer at age 77   History of shingles    Hyperlipidemia    Hypertension    takes Cardura nightly and Verapamil daily   Hypothyroidism    Insomnia    takes Restoril nightly   Joint pain    Nonischemic cardiomyopathy ([place]) 07/2015   Normal coronary arteries by cath. EF was 25-30% by echo. GR2 DD - likely related to LBBB in setting of A. fib   Obesity (BMI 30-39.9) 06/11/2013   Paroxysmal atrial fibrillation ([place]): CHA2DS2-VASc Score - starting Eliquis 08/06/2015   This patients CHA2DS2-VASc Score and unadjusted Ischemic Stroke Rate (% per year) is equal to 4.8 % stroke rate/year from a score of 4  Above score calculated as 1 point each if present [CHF, HTN, DM, Vascular=MI/PAD/Aortic Plaque, Age if 65-74, or Female]; 2 points each if present [Age > 75, or Stroke/TIA/TE]   PONV (postoperative nausea and vomiting)    S/P radiation therapy 07/18/2013-08/28/2013   1) Left Breast / 50 Gy in 25 fractions/ 2) Left Breast Boost / 10 Gy in 5 fractions   Shingles    Sinus drainage    put on Levaquin yesterday if no better in 3 days will start prednisone   Thyroid disease    Weakness    tingling and numbness both hands and left leg   Wears glasses     PAST SURGICAL HISTORY: Past  Surgical History:  Procedure Laterality Date   ABDOMINAL HYSTERECTOMY     BREAST LUMPECTOMY Left    neg   CARDIAC CATHETERIZATION N/A 08/10/2015   Procedure: Right/Left Heart Cath and Coronary Angiography;  Surgeon: Burnell Blanks, MD;  Location: [facility];  Service: Cardiovascular;: Nonobstructive CAD   CATARACT EXTRACTION BILATERAL W/ ANTERIOR VITRECTOMY Bilateral    CHOLECYSTECTOMY     COLONOSCOPY  2014   DUPUYTREN / PALMAR FASCIOTOMY Bilateral 2007   x 3 to left and once to the right    epidural injections     x 3   FOOT TENDON SURGERY Right ~ 11/2015   "stepped in a hole and tore 2 tendons"   JOINT REPLACEMENT     KNEE ARTHROSCOPY Bilateral    LUMBAR FUSION  10/2013   Dr. Lynann Bologna   TONSILLECTOMY     TOTAL KNEE ARTHROPLASTY Left 09/23/2016   TOTAL KNEE ARTHROPLASTY Left 09/23/2016   Procedure: TOTAL KNEE ARTHROPLASTY;  Surgeon: Dorna Leitz, MD;  Location: [facility];  Service: Orthopedics;  Laterality: Left;   TRANSTHORACIC ECHOCARDIOGRAM  08/07/2015   Severely reduced LVEF at 25-30% with diffuse HK. GR 2 DD. Ventricular dyssynergy secondary to LBBB. Small to  moderate pericardial effusion but no hemodynamic compromise   TRANSTHORACIC ECHOCARDIOGRAM  10/2015    (EF up from 25-30%): - Left ventricle: Poor acoustic windows -  difficult to see.    EF estimated at 45-50% with inferoseptal and possible posterior hypokinesis. GR 1 DD. No pericardial effusion.   TUBAL LIGATION     UPPER GI ENDOSCOPY      FAMILY HISTORY Family History  Problem Relation Age of Onset   Heart attack Mother    Stroke Mother    COPD Father    Hypertension Father    Colon polyps Neg Hx    Esophageal cancer Neg Hx    Rectal cancer Neg Hx    Stomach cancer Neg Hx    the patient's father died at the age of 9 from uremia. The patient's mother died at the age of 59 from a heart attack. The patient has one brother, 5 sisters. One of her sisters was diagnosed with breast  cancer in her 62s. There is no history of ovarian cancer in the family  GYNECOLOGIC HISTORY:  No LMP recorded. Patient has had a hysterectomy. Menarche age 51, first live birth age 84. The patient is GX P1. She had a hysterectomy with bilateral salpingo-oophorectomy in her 62s and receive hormone replacement for approximately 30 years after that.  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 Her husband died a few years ago with severe respiratory complications. He had a tracheostomy. She was his primary caregiver. She now lives alone with her female Educational psychologist. Her son Harrell Gave lives in [place]. He "runs the [employer]" there. The patient has one biological and 3 stepgrandchildren. She is a [employer]     ADVANCED DIRECTIVES: Not in place; at the 01/13/2014 visit the patient received the appropriate documents to declare healthcare power of attorney.   HEALTH MAINTENANCE: Social History   Tobacco Use   Smoking status: Never Smoker   Smokeless tobacco: Never Used  Substance Use Topics   Alcohol use: Yes    Comment: wine occasionally-rarely   Drug use: No     Colonoscopy: 03/19/2013, Dr. Deatra Ina, repeat 2024  PAP:  Bone density: 06/08/2018, T-score of -1.7  Lipid panel: 04/23/2018, normal  Allergies  Allergen Reactions   Requip [Ropinirole Hcl] Shortness Of Breath and Nausea And Vomiting   Minocycline Hives   Tetracyclines & Related Hives   Zestril [Lisinopril] Cough   Zetia [Ezetimibe] Other (See Comments)    MYALGIA JOINT PAIN   Amoxicillin Rash   Codeine Other (See Comments)    fatigue   Morphine Sulfate Other (See Comments)    Palpitations   Sulfa Antibiotics Other (See Comments)    Headache (pt states that a blood pressure medicine caused a severe headache but doesn't remember a reaction to sulfa)    Current Outpatient Medications  Medication Sig  Dispense Refill   buPROPion (WELLBUTRIN XL) 150 MG 24 hr tablet TAKE 1 TABLET BY MOUTH EVERY DAY IN THE MORNING 90 tablet 1   carvedilol (COREG) 25 MG tablet TAKE 1 TABLET TWICE A DAY OR AS DIRECTED 180 tablet 1   chlorhexidine (PERIDEX) 0.12 % solution SWISH 1 CAPFUL FOR 30 SECONDS TWICE A DAY *SPIT OUT AFTER USE* *START 1 WEEK PRIOR TO SURGERY*     Cholecalciferol (VITAMIN D) 2000 UNITS tablet Take 4,000 Units by mouth daily.     Coenzyme Q10 (COQ10) 100 MG CAPS  Take 300 mg by mouth daily. 90 each 11   docusate sodium (COLACE) 100 MG capsule Take 1 capsule (100 mg total) by mouth 2 (two) times daily. 30 capsule 0   DULoxetine (CYMBALTA) 30 MG capsule Take 1 capsule (30 mg total) by mouth 3 (three) times daily. 270 capsule 3   ELIQUIS 5 MG TABS tablet TAKE 1 TABLET BY MOUTH TWICE A DAY 180 tablet 1   ferrous sulfate 325 (65 FE) MG tablet TAKE 1 TABLET (325 MG TOTAL) BY MOUTH 2 (TWO) TIMES DAILY WITH A MEAL. 180 tablet 3   furosemide (LASIX) 20 MG tablet TAKE 1 TABLET BY MOUTH TWICE A DAY 180 tablet 1   gabapentin (NEURONTIN) 300 MG capsule Take 1 capsule (300 mg total) by mouth 2 (two) times daily. 90 capsule 1   ipratropium (ATROVENT) 0.03 % nasal spray Place 2 sprays into the nose 3 (three) times daily. 54 mL 3   levothyroxine (SYNTHROID, LEVOTHROID) 50 MCG tablet TAKE 1/2 TO 1 TABLET BY MOUTH ON AN EMPTY STOMCH 30 TO 60 MINUTES BEFORE BREAKFAST 90 tablet 1   losartan (COZAAR) 100 MG tablet TAKE 1 TABLET BY MOUTH EVERY DAY REPLACES VALSARTAN 90 tablet 3   mometasone (NASONEX) 50 MCG/ACT nasal spray USE 1 TO 2 SPRAYS IN EACH NOSTRIL TWICE DAILY 17 g 6   nystatin ointment (MYCOSTATIN) Apply 1 application topically 2 (two) times daily. 30 g 0   omeprazole (PRILOSEC) 40 MG capsule TAKE 1 CAPSULE BY MOUTH EVERY DAY 90 capsule 1   ranitidine (ZANTAC) 300 MG tablet TAKE 2 TABLETS DAILY AS NEEDED FOR ACID INDIGESTION & REFLUX     rosuvastatin (CRESTOR) 40 MG tablet TAKE 1 TABLET  EVERY DAY, EXCEPT TAKE 2 TABLETS ON MONDAY 102 tablet 0   temazepam (RESTORIL) 30 MG capsule TAKE 1 CAPSULE AT BEDTIME ONLY IF NEEDED 90 capsule 0   triamcinolone ointment (KENALOG) 0.1 % Apply 1 application topically 2 (two) times daily. 80 g 1   No current facility-administered medications for this visit.     OBJECTIVE: Middle-aged white woman who appears stated age  There were no vitals filed for this visit.   There is no height or weight on file to calculate BMI.    ECOG FS:1 - Symptomatic but completely ambulatory    LAB RESULTS:  CMP     Component Value Date/Time   NA 138 04/23/2018 1424   NA 139 07/13/2016 1005   K 4.0 04/23/2018 1424   K 3.7 07/13/2016 1005   CL 103 04/23/2018 1424   CO2 28 04/23/2018 1424   CO2 25 07/13/2016 1005   GLUCOSE 91 04/23/2018 1424   GLUCOSE 135 07/13/2016 1005   BUN 21 04/23/2018 1424   BUN 15.2 07/13/2016 1005   CREATININE 1.19 (H) 04/23/2018 1424   CREATININE 0.8 07/13/2016 1005   CALCIUM 9.3 04/23/2018 1424   CALCIUM 9.7 07/13/2016 1005   PROT 6.4 04/23/2018 1424   PROT 7.2 07/13/2016 1005   ALBUMIN 3.6 07/13/2017 1321   ALBUMIN 3.6 07/13/2016 1005   AST 16 04/23/2018 1424   AST 15 07/13/2017 1321   AST 17 07/13/2016 1005   ALT 12 04/23/2018 1424   ALT 13 07/13/2017 1321   ALT 20 07/13/2016 1005   ALKPHOS 98 07/13/2017 1321   ALKPHOS 150 07/13/2016 1005   BILITOT 0.3 04/23/2018 1424   BILITOT 0.2 07/13/2017 1321   BILITOT 0.54 07/13/2016 1005   GFRNONAA 46 (L) 04/23/2018 1424   GFRAA 53 (L) 04/23/2018  1424    I No results found for: SPEP  Lab Results  Component Value Date   WBC 7.8 04/23/2018   NEUTROABS 5,413 04/23/2018   HGB 10.0 (L) 04/23/2018   HCT 29.8 (L) 04/23/2018   MCV 89.5 04/23/2018   PLT 276 04/23/2018      Chemistry      Component Value Date/Time   NA 138 04/23/2018 1424   NA 139 07/13/2016 1005   K 4.0 04/23/2018 1424   K 3.7 07/13/2016 1005   CL 103 04/23/2018 1424   CO2 28 04/23/2018  1424   CO2 25 07/13/2016 1005   BUN 21 04/23/2018 1424   BUN 15.2 07/13/2016 1005   CREATININE 1.19 (H) 04/23/2018 1424   CREATININE 0.8 07/13/2016 1005      Component Value Date/Time   CALCIUM 9.3 04/23/2018 1424   CALCIUM 9.7 07/13/2016 1005   ALKPHOS 98 07/13/2017 1321   ALKPHOS 150 07/13/2016 1005   AST 16 04/23/2018 1424   AST 15 07/13/2017 1321   AST 17 07/13/2016 1005   ALT 12 04/23/2018 1424   ALT 13 07/13/2017 1321   ALT 20 07/13/2016 1005   BILITOT 0.3 04/23/2018 1424   BILITOT 0.2 07/13/2017 1321   BILITOT 0.54 07/13/2016 1005       Lab Results  Component Value Date   LABCA2 30 05/31/2013    No components found for: [id number]  No results for input(s): INR in the last 168 hours.  Urinalysis    Component Value Date/Time   COLORURINE YELLOW 01/16/2018 1443   APPEARANCEUR CLEAR 01/16/2018 1443   LABSPEC 1.023 01/16/2018 1443   PHURINE < OR = 5.0 01/16/2018 1443   GLUCOSEU NEGATIVE 01/16/2018 1443   HGBUR NEGATIVE 01/16/2018 1443   BILIRUBINUR NEGATIVE 11/24/2016 1050   KETONESUR NEGATIVE 01/16/2018 1443   PROTEINUR 1+ (A) 01/16/2018 1443   UROBILINOGEN 0.2 06/16/2014 1523   NITRITE NEGATIVE 01/16/2018 1443   LEUKOCYTESUR 1+ (A) 01/16/2018 1443    STUDIES: Mammography and bone density results reviewed with the patient  ASSESSMENT: 73 y.o. [place] woman  (1) status post left lumpectomy and sentinel lymph node sampling 06/06/2013 for a pT1c pN0, stage IA invasive lobular carcinoma, grade 1, estrogen receptor and progesterone receptor strongly positive, with no HER-2 amplification, and an MIB-1 of 14%.  (2) Oncotype DX score of 11 predicted a 10 year risk of outside the breast recurrence of 8% of the patient's only local treatment was tamoxifen for 5 years. It also predicted no benefit from chemotherapy.  (3) adjuvant radiation to the left breast completed 08/28/2013.  (4) started anastrozole June 2015, stopping May 2020, completing 5 years of  treatment  (a) bone density scan September 2015 was normal (T score positive)  (b) repeat bone density 06/08/2018 shows a T score of -1.7  (5) status post back injury requiring posterior lumbar fusion at the L4-5 level  (6) status post remote hysterectomy with bilateral salpingo-oophorectomy  (7) mild normocytic anemia: workup OCT 2019 showed normal B-12, folate, ferritin, reticulocyte count, CRP; positive ANA at 1:80, ESR 35-- c/w "anemia of chronic inflammation"  PLAN: Leandrea is now a little over 5 years out from definitive surgery for breast cancer with no evidence of disease recurrence.  This is very favorable.  She has completed 5 years of anastrozole.  This is cut her risk of breast cancer recurrence and also her risk of developing a new breast cancer by half.  Continuing on anastrozole only minimally affect her prognosis and  therefore she is stopping the medication now  At this point I am comfortable releasing her to her primary care physician.  All she will need in terms of breast cancer follow-up is a yearly physician breast exam and yearly mammography  I am not making a return appointment for her here but will be glad to see her again at any point in the future if and when the need arises   Keauna Brasel, Virgie Dad, MD  08/16/18 2:30 PM Medical Oncology and Hematology [facility] [place], [place] [address] Tel. [phone]    Fax. [phone]   I, Wilburn Mylar, am acting as scribe for Dr. Virgie Dad. Rashika Bettes.  I, Lurline Del MD, have reviewed the above documentation for accuracy and completeness, and I agree with the above.

## 2018-08-16 ENCOUNTER — Inpatient Hospital Stay: Payer: Medicare Other | Attending: Oncology | Admitting: Oncology

## 2018-08-16 ENCOUNTER — Other Ambulatory Visit: Payer: Medicare Other

## 2018-08-16 ENCOUNTER — Encounter: Payer: Self-pay | Admitting: Oncology

## 2018-08-16 DIAGNOSIS — C50312 Malignant neoplasm of lower-inner quadrant of left female breast: Secondary | ICD-10-CM | POA: Diagnosis not present

## 2018-08-16 DIAGNOSIS — Z170 Estrogen receptor positive status [ER+]: Secondary | ICD-10-CM | POA: Diagnosis not present

## 2018-08-16 DIAGNOSIS — M8580 Other specified disorders of bone density and structure, unspecified site: Secondary | ICD-10-CM

## 2018-08-16 DIAGNOSIS — I480 Paroxysmal atrial fibrillation: Secondary | ICD-10-CM

## 2018-08-16 DIAGNOSIS — Z923 Personal history of irradiation: Secondary | ICD-10-CM

## 2018-08-16 DIAGNOSIS — Z7901 Long term (current) use of anticoagulants: Secondary | ICD-10-CM

## 2018-08-16 DIAGNOSIS — Z79899 Other long term (current) drug therapy: Secondary | ICD-10-CM

## 2018-08-16 DIAGNOSIS — I471 Supraventricular tachycardia: Secondary | ICD-10-CM

## 2018-08-16 DIAGNOSIS — Z79890 Hormone replacement therapy (postmenopausal): Secondary | ICD-10-CM

## 2018-08-17 ENCOUNTER — Other Ambulatory Visit: Payer: Self-pay | Admitting: Cardiology

## 2018-08-17 NOTE — Telephone Encounter (Signed)
Rosuvastatin 40 mg refilled. 

## 2018-08-23 ENCOUNTER — Other Ambulatory Visit: Payer: Self-pay | Admitting: Adult Health

## 2018-08-24 ENCOUNTER — Ambulatory Visit: Payer: Medicare Other | Admitting: Podiatry

## 2018-08-24 ENCOUNTER — Other Ambulatory Visit: Payer: Self-pay

## 2018-08-24 VITALS — Temp 97.2°F

## 2018-08-24 DIAGNOSIS — M21962 Unspecified acquired deformity of left lower leg: Secondary | ICD-10-CM

## 2018-08-24 DIAGNOSIS — M7752 Other enthesopathy of left foot: Secondary | ICD-10-CM

## 2018-08-24 DIAGNOSIS — M779 Enthesopathy, unspecified: Secondary | ICD-10-CM

## 2018-08-24 DIAGNOSIS — M2580 Other specified joint disorders, unspecified joint: Secondary | ICD-10-CM

## 2018-08-24 NOTE — Progress Notes (Signed)
Subjective:  Patient ID: [name] [name], female    DOB: 1946/01/14,  MRN: [id number]  Chief Complaint  Patient presents with  . Foot Pain    Pt states left 1st MT head pain, pt has diagnosis of capsulitis.    73 y.o. female presents with the above complaint. States the injection she has had helps. Uses dancer's pads which help as well.  Review of Systems: Negative except as noted in the HPI. Denies N/V/F/Ch.  Past Medical History:  Diagnosis Date  . Allergy    Nasonex daily as needed  . Anemia   . Anxiety   . Arthritis   . 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 Chronic back pain    cyst sitting on L4-5;slipped disc  . Depression    takes Cymbalta daily  . Diverticulosis   . Dysrhythmia   . GERD (gastroesophageal reflux disease)    takes Omeprazole daily  . Headache(784.0)    rare  . History of gastric ulcer at age 11  . History of shingles   . Hyperlipidemia   . Hypertension    takes Cardura nightly and Verapamil daily  . Hypothyroidism   . Insomnia    takes Restoril nightly  . Joint pain   . Nonischemic cardiomyopathy ([place]) 07/2015   Normal coronary arteries by cath. EF was 25-30% by echo. GR2 DD - likely related to LBBB in setting of A. fib  . Obesity (BMI 30-39.9) 06/11/2013  . Paroxysmal atrial fibrillation ([place]): CHA2DS2-VASc Score - starting Eliquis 08/06/2015   This patients CHA2DS2-VASc Score and unadjusted Ischemic Stroke Rate (% per year) is equal to 4.8 % stroke rate/year from a score of 4  Above score calculated as 1 point each if present [CHF, HTN, DM, Vascular=MI/PAD/Aortic Plaque, Age if 65-74, or Female]; 2 points each if present [Age > 75, or Stroke/TIA/TE]  . PONV (postoperative nausea and vomiting)   . S/P radiation therapy 07/18/2013-08/28/2013   1) Left Breast / 50 Gy in 25 fractions/ 2) Left Breast Boost / 10 Gy in 5 fractions  . Shingles   . Sinus drainage    put on Levaquin yesterday if no  better in 3 days will start prednisone  . Thyroid disease   . Weakness    tingling and numbness both hands and left leg  . Wears glasses     Current Outpatient Medications:  .  buPROPion (WELLBUTRIN XL) 150 MG 24 hr tablet, TAKE 1 TABLET BY MOUTH EVERY DAY IN THE MORNING, Disp: 90 tablet, Rfl: 1 .  carvedilol (COREG) 25 MG tablet, TAKE 1 TABLET TWICE A DAY OR AS DIRECTED, Disp: 180 tablet, Rfl: 1 .  chlorhexidine (PERIDEX) 0.12 % solution, SWISH 1 CAPFUL FOR 30 SECONDS TWICE A DAY *SPIT OUT AFTER USE* *START 1 WEEK PRIOR TO SURGERY*, Disp: , Rfl:  .  Cholecalciferol (VITAMIN D) 2000 UNITS tablet, Take 4,000 Units by mouth daily., Disp: , Rfl:  .  Coenzyme Q10 (COQ10) 100 MG CAPS, Take 300 mg by mouth daily., Disp: 90 each, Rfl: 11 .  docusate sodium (COLACE) 100 MG capsule, Take 1 capsule (100 mg total) by mouth 2 (two) times daily., Disp: 30 capsule, Rfl: 0 .  DULoxetine (CYMBALTA) 30 MG capsule, Take 1 capsule (30 mg total) by mouth 3 (three) times daily., Disp: 270 capsule, Rfl: 3 .  ELIQUIS 5 MG TABS tablet, TAKE 1 TABLET BY MOUTH TWICE A DAY, Disp: 180 tablet, Rfl:  1 .  ferrous sulfate 325 (65 FE) MG tablet, TAKE 1 TABLET (325 MG TOTAL) BY MOUTH 2 (TWO) TIMES DAILY WITH A MEAL., Disp: 180 tablet, Rfl: 3 .  furosemide (LASIX) 20 MG tablet, TAKE 1 TABLET BY MOUTH TWICE A DAY, Disp: 180 tablet, Rfl: 1 .  gabapentin (NEURONTIN) 300 MG capsule, Take 1 capsule (300 mg total) by mouth 2 (two) times daily., Disp: 90 capsule, Rfl: 1 .  levothyroxine (SYNTHROID, LEVOTHROID) 50 MCG tablet, TAKE 1/2 TO 1 TABLET BY MOUTH ON AN EMPTY STOMCH 30 TO 60 MINUTES BEFORE BREAKFAST, Disp: 90 tablet, Rfl: 1 .  losartan (COZAAR) 100 MG tablet, TAKE 1 TABLET BY MOUTH EVERY DAY REPLACES VALSARTAN, Disp: 90 tablet, Rfl: 3 .  mometasone (NASONEX) 50 MCG/ACT nasal spray, USE 1 TO 2 SPRAYS IN EACH NOSTRIL TWICE DAILY, Disp: 17 g, Rfl: 6 .  nystatin ointment (MYCOSTATIN), Apply 1 application topically 2 (two) times  daily., Disp: 30 g, Rfl: 0 .  omeprazole (PRILOSEC) 40 MG capsule, Take 1 capsule Daily for Acid Indigestion & Reflux., Disp: 90 capsule, Rfl: 3 .  ranitidine (ZANTAC) 300 MG tablet, TAKE 2 TABLETS DAILY AS NEEDED FOR ACID INDIGESTION & REFLUX, Disp: , Rfl:  .  rosuvastatin (CRESTOR) 40 MG tablet, TAKE 1 TABLET EVERY DAY, EXCEPT TAKE 2 TABLETS ON MONDAY, Disp: 102 tablet, Rfl: 1 .  temazepam (RESTORIL) 30 MG capsule, TAKE 1 CAPSULE AT BEDTIME ONLY IF NEEDED, Disp: 90 capsule, Rfl: 0 .  triamcinolone ointment (KENALOG) 0.1 %, Apply 1 application topically 2 (two) times daily., Disp: 80 g, Rfl: 1 .  ipratropium (ATROVENT) 0.03 % nasal spray, Place 2 sprays into the nose 3 (three) times daily., Disp: 54 mL, Rfl: 3  Social History   Tobacco Use  Smoking Status Never Smoker  Smokeless Tobacco Never Used    Allergies  Allergen Reactions  . Requip [Ropinirole Hcl] Shortness Of Breath and Nausea And Vomiting  . Minocycline Hives  . Tetracyclines & Related Hives  . Zestril [Lisinopril] Cough  . Zetia [Ezetimibe] Other (See Comments)    MYALGIA JOINT PAIN  . Amoxicillin Rash  . Codeine Other (See Comments)    fatigue  . Morphine Sulfate Other (See Comments)    Palpitations  . Sulfa Antibiotics Other (See Comments)    Headache (pt states that a blood pressure medicine caused a severe headache but doesn't remember a reaction to sulfa)   Objective:   Vitals:   08/24/18 1227  Temp: (!) 97.2 F (36.2 C)   There is no height or weight on file to calculate BMI. Constitutional Well developed. Well nourished.  Vascular Dorsalis pedis pulses palpable bilaterally. Posterior tibial pulses palpable bilaterally. Capillary refill normal to all digits.  No cyanosis or clubbing noted. Pedal hair growth normal.  Neurologic Normal speech. Oriented to person, place, and time. Epicritic sensation to light touch grossly present bilaterally.  Dermatologic Nails well groomed and normal in appearance.  No open wounds. No skin lesions.  Orthopedic: POP left sesamoid complex. No pain on 1st great toe ROM.   Radiographs: None Assessment:   1. Capsulitis   2. Sesamoiditis   3. Metatarsal deformity, left    Plan:  Patient was evaluated and treated and all questions answered.  Sesamoiditis, Capsulitis Left -Injection as below -Would benefit from CMOs/ Will check coverage -Dispensed dancer pads  Procedure: Joint Injection Location: Left sesamoid complex Skin Prep: Alcohol. Injectate: 0.5 cc 1% lidocaine plain, 0.5 cc dexamethasone phosphate. Disposition: Patient tolerated procedure well.  Injection site dressed with a band-aid.    No follow-ups on file.

## 2018-08-24 NOTE — Progress Notes (Signed)
Pt has [facility] m[facility] and they do not cover them

## 2018-08-29 ENCOUNTER — Other Ambulatory Visit: Payer: Self-pay

## 2018-08-29 ENCOUNTER — Ambulatory Visit
Admission: RE | Admit: 2018-08-29 | Discharge: 2018-08-29 | Disposition: A | Discharge status: Home / Self Care | Payer: Medicare Other | Source: Ambulatory Visit | Attending: Physical Medicine and Rehabilitation | Admitting: Physical Medicine and Rehabilitation

## 2018-08-29 DIAGNOSIS — M542 Cervicalgia: Secondary | ICD-10-CM

## 2018-08-29 MED ORDER — IOPAMIDOL (ISOVUE-M 300) INJECTION 61%
1.0000 mL | Freq: Once | INTRAMUSCULAR | Status: AC
  Administered 2018-08-29: 1 mL via EPIDURAL

## 2018-08-29 MED ORDER — TRIAMCINOLONE ACETONIDE 40 MG/ML IJ SUSP (RADIOLOGY)
60.0000 mg | Freq: Once | INTRAMUSCULAR | Status: AC
  Administered 2018-08-29: 60 mg via EPIDURAL

## 2018-08-29 NOTE — Discharge Instructions (Signed)
Spinal Injection Discharge Instruction Sheet ° °1. You may resume a regular diet and any medications that you routinely take, including pain medications. ° °2. No driving the rest of the day of the procedure. ° °3. Light activity throughout the rest of the day.  Do not do any strenuous work, exercise, bending or lifting.  The day following the procedure, you may resume normal physical activity but you should refrain from exercising or physical therapy for at least three days. ° ° °Common Side Effects: ° °· Headaches- take your usual medications as directed by your physician.   ° °· Restlessness or inability to sleep- you may have trouble sleeping for the next few days.  Ask your referring physician if you need any medication for sleep if over the counter sleep medications do not help. ° °· Facial flushing or redness- this should subside within a few days. ° °· Increased pain- a temporary increase in pain a day or two following your procedure is not unusual.  Take your pain medication as prescribed by your referring physician.  You may use ice to the injection site as needed.  Please do not use heat for 24 hours. ° °· Leg cramps ° °Please contact our office at [phone] for the following symptoms: °· Fever greater than 100 degrees. °· Headaches unresolved with medication after 2-3 days. °· Increased swelling, pain, or redness at injection site. ° °Thank you for visiting our office. ° ° °You may resume Eliquis today. °

## 2018-09-06 ENCOUNTER — Other Ambulatory Visit: Payer: Self-pay | Admitting: Physician Assistant

## 2018-09-06 MED ORDER — MOMETASONE FUROATE 50 MCG/ACT NA SUSP: NASAL | 6 refills | Status: DC

## 2018-09-10 ENCOUNTER — Other Ambulatory Visit: Payer: Self-pay | Admitting: Internal Medicine

## 2018-09-10 ENCOUNTER — Telehealth: Payer: Self-pay | Admitting: Podiatry

## 2018-09-10 NOTE — Telephone Encounter (Signed)
Pt has [facility] medicare and they are not covered. Pt is aware and I also gave her the payment plan (100.00 down) information.She is going to think about it.

## 2018-09-10 NOTE — Telephone Encounter (Signed)
Noted thanks °

## 2018-09-11 ENCOUNTER — Other Ambulatory Visit: Payer: Self-pay | Admitting: Internal Medicine

## 2018-09-11 DIAGNOSIS — K219 Gastro-esophageal reflux disease without esophagitis: Secondary | ICD-10-CM

## 2018-09-11 MED ORDER — FAMOTIDINE 20 MG PO TABS: ORAL_TABLET | ORAL | 3 refills | Status: DC

## 2018-09-12 ENCOUNTER — Other Ambulatory Visit: Payer: Self-pay | Admitting: Internal Medicine

## 2018-09-21 ENCOUNTER — Encounter: Payer: Self-pay | Admitting: Adult Health

## 2018-09-21 DIAGNOSIS — N183 Chronic kidney disease, stage 3 unspecified: Secondary | ICD-10-CM | POA: Insufficient documentation

## 2018-09-21 NOTE — Progress Notes (Signed)
MEDICARE ANNUAL WELLNESS VISIT AND FOLLOW UP  Assessment:   Essential hypertension - continue medications, DASH diet, exercise and monitor at home. Call if greater than 130/80.   -     CBC with Differential/Platelet -     CMP/GFR -     TSH  Paroxysmal atrial fibrillation [facility]): CHA2DS2-VASc Score - starting Eliquis Control blood pressure, cholesterol, glucose, increase exercise.  Continue cardio follow up  Chronic combined systolic and diastolic heart failure, NYHA class 2 ([place]) -- exacerbated by A. Fib Weight stable, followed by cardiology  Non-ischemic cardiomyopathy ([place]) Weight stable at home Optimize meds  Paroxysmal supraventricular tachycardia (HCC) Control blood pressure, cholesterol, glucose, increase exercise.  Continue cardio follow up  Non-seasonal allergic rhinitis, unspecified trigger - Allegra OTC, increase H20, allergy hygiene explained.  Gastroesophageal reflux disease without esophagitis Continue PPI/H2 blocker, diet discussed  Other specified hypothyroidism Hypothyroidism-check TSH level, continue medications the same, reminded to take on an empty stomach 30-46mins before food.  -     TSH  Lumbar radiculopathy Follow up ortho/pain management  DDD (degenerative disc disease), lumbar Follow up ortho  Hyperlipidemia -continue medications, check lipids, decrease fatty foods, increase activity.  -     Lipid panel  H/O left bundle branch block Continue cardio follow up  ANXIETY DEPRESSION Continue meds  History of pancreatitis Monitor, no ETOH  Malignant neoplasm of lower-inner quadrant of left breast in female, estrogen receptor positive ([place]) Continue follow up oncology, continue meds  Morbid Obesity with co morbidities - long discussion about weight loss, diet, and exercise  Prediabetes Discussed general issues about diabetes pathophysiology and management., Educational material distributed., Suggested low cholesterol diet., Encouraged  aerobic exercise., Discussed foot care., Reminded to get yearly retinal exam. -     Hemoglobin A1c  Vitamin D deficiency Continue supplementation Check vitamin D level  History of ulcer disease Monitor, avoid NSAIDS, continue GERD meds  CKD III Increase fluids, avoid NSAIDS, monitor sugars, will monitor  Need for tetanus booster Td administered today   Over 40 minutes of exam, counseling, chart review and critical decision making was performed Future Appointments  Date Time Provider [place]  01/17/2019  2:00 PM Vicie Mutters, PA-C [id number] None     Plan:   During the course of the visit the patient was educated and counseled about appropriate screening and preventive services including:    Pneumococcal vaccine   Prevnar 13  Influenza vaccine  Td vaccine  Screening electrocardiogram  Bone densitometry screening  Colorectal cancer screening  Diabetes screening  Glaucoma screening  Nutrition counseling   Advanced directives: requested   Subjective:  [name] [name] is a 73 y.o. female who presents for Medicare Annual Wellness Visit and 3 month follow up.   She has hx of a. Fib, on elequis, followed by cardiology Dr. Ellyn Hack.   She has hx of R breast cancer in 2015, s/p lumpectomy, on anastrozole, followed by Dr. Jana Hakim.   She follows with Dr. Verl Blalock (pain management) for cervical and lower back pain with radiculopathy and gets epidural injections. She also has knee pain seen by Dr. [name]. Has discussed spine surgery but would prefer to avoid.   she has a diagnosis of depression/anxiety and is currently on wellbutrin 150 mg, cymbalta 90 mg, reports symptoms are well controlled on current regimen. she also takes temazepam PRN insomnia daily, hasn't done well with attempts to taper.   she has a diagnosis of GERD with history of ulcer, which is currently managed by omeprazole 40  mg and famotidine PRN breakthrough.  she reports symptoms is  currently well controlled, and denies breakthrough reflux, burning in chest, hoarseness or cough.    BMI is Body mass index is 39.88 kg/m., she has not been working on diet and exercise. Wt Readings from Last 3 Encounters:  09/24/18 236 lb (107 kg)  04/23/18 230 lb (104.3 kg)  03/23/18 230 lb (104.3 kg)    Her blood pressure has been controlled at home, today their BP is BP: 110/62 She does not workout. She denies chest pain, shortness of breath, dizziness.   She is on cholesterol medication (rosuvastatin 40 mg daily) and denies myalgias. Her cholesterol is at goal. The cholesterol last visit was:   Lab Results  Component Value Date   CHOL 139 04/23/2018   HDL 53 04/23/2018   LDLCALC 64 04/23/2018   TRIG 137 04/23/2018   CHOLHDL 2.6 04/23/2018    She has not been working on diet and exercise for prediabetes, and denies foot ulcerations, increased appetite, nausea, paresthesia of the feet, polydipsia, polyuria, visual disturbances, vomiting and weight loss. Last A1C in the office was:  Lab Results  Component Value Date   HGBA1C 6.2 (H) 04/23/2018   Last GFR: Lab Results  Component Value Date   GFRNONAA 46 (L) 04/23/2018   Patient is on Vitamin D supplement.   Lab Results  Component Value Date   VD25OH 82 04/23/2018       Medication Review: Current Outpatient Medications on File Prior to Visit  Medication Sig Dispense Refill  . buPROPion (WELLBUTRIN XL) 150 MG 24 hr tablet TAKE 1 TABLET BY MOUTH EVERY DAY IN THE MORNING 90 tablet 1  . carvedilol (COREG) 25 MG tablet TAKE 1 TABLET TWICE A DAY OR AS DIRECTED 180 tablet 1  . chlorhexidine (PERIDEX) 0.12 % solution SWISH 1 CAPFUL FOR 30 SECONDS TWICE A DAY *SPIT OUT AFTER USE* *START 1 WEEK PRIOR TO SURGERY*    . Cholecalciferol (VITAMIN D) 2000 UNITS tablet Take 4,000 Units by mouth daily.    . Coenzyme Q10 (COQ10) 100 MG CAPS Take 300 mg by mouth daily. 90 each 11  . docusate sodium (COLACE) 100 MG capsule Take 1 capsule  (100 mg total) by mouth 2 (two) times daily. 30 capsule 0  . DULoxetine (CYMBALTA) 30 MG capsule Take 1 capsule (30 mg total) by mouth 3 (three) times daily. 270 capsule 3  . ELIQUIS 5 MG TABS tablet TAKE 1 TABLET BY MOUTH TWICE A DAY 180 tablet 1  . famotidine (PEPCID) 20 MG tablet Take 1 tablet 2 x /day with meals for Acid Indigestion 180 tablet 3  . ferrous sulfate 325 (65 FE) MG tablet TAKE 1 TABLET (325 MG TOTAL) BY MOUTH 2 (TWO) TIMES DAILY WITH A MEAL. 180 tablet 3  . furosemide (LASIX) 20 MG tablet TAKE 1 TABLET BY MOUTH TWICE A DAY 180 tablet 1  . gabapentin (NEURONTIN) 300 MG capsule Take 1 capsule (300 mg total) by mouth 2 (two) times daily. 90 capsule 1  . ipratropium (ATROVENT) 0.03 % nasal spray Place 2 sprays into the nose 3 (three) times daily. 54 mL 3  . levothyroxine (SYNTHROID, LEVOTHROID) 50 MCG tablet TAKE 1/2 TO 1 TABLET BY MOUTH ON AN EMPTY STOMCH 30 TO 60 MINUTES BEFORE BREAKFAST 90 tablet 1  . losartan (COZAAR) 100 MG tablet TAKE 1 TABLET BY MOUTH EVERY DAY REPLACES VALSARTAN 90 tablet 3  . mometasone (NASONEX) 50 MCG/ACT nasal spray USE 1 TO 2 SPRAYS  IN EACH NOSTRIL TWICE DAILY 17 g 6  . nystatin ointment (MYCOSTATIN) Apply 1 application topically 2 (two) times daily. 30 g 0  . omeprazole (PRILOSEC) 40 MG capsule Take 1 capsule Daily for Acid Indigestion & Reflux. 90 capsule 3  . rosuvastatin (CRESTOR) 40 MG tablet TAKE 1 TABLET EVERY DAY, EXCEPT TAKE 2 TABLETS ON MONDAY 102 tablet 1  . temazepam (RESTORIL) 30 MG capsule TAKE 1 CAPSULE AT BEDTIME ONLY IF NEEDED 90 capsule 0  . triamcinolone ointment (KENALOG) 0.1 % Apply 1 application topically 2 (two) times daily. 80 g 1   No current facility-administered medications on file prior to visit.     Allergies  Allergen Reactions  . Requip [Ropinirole Hcl] Shortness Of Breath and Nausea And Vomiting  . Minocycline Hives  . Tetracyclines & Related Hives  . Zestril [Lisinopril] Cough  . Zetia [Ezetimibe] Other (See  Comments)    MYALGIA JOINT PAIN  . Amoxicillin Rash  . Codeine Other (See Comments)    fatigue  . Morphine Sulfate Other (See Comments)    Palpitations  . Sulfa Antibiotics Other (See Comments)    Headache (pt states that a blood pressure medicine caused a severe headache but doesn't remember a reaction to sulfa)    Current Problems (verified) Patient Active Problem List   Diagnosis Date Noted  . CKD (chronic kidney disease) stage 3, GFR 30-59 ml/min (HCC) 09/21/2018  . Morbid obesity ([place]) 06/14/2017  . Primary osteoarthritis of left knee 09/23/2016  . Non-ischemic cardiomyopathy ([place])   . Chronic combined systolic and diastolic heart failure, NYHA class 2 ([place]) -- exacerbated by A. fib 08/08/2015  . Paroxysmal atrial fibrillation [facility]): CHA2DS2-VASc Score - starting Eliquis 08/06/2015  . Hypothyroidism 08/06/2015  . Absolute anemia   . Encounter for Medicare annual wellness exam 03/31/2015  . DDD (degenerative disc disease), lumbar 06/16/2014  . Other abnormal glucose (prediabetes) 12/18/2013  . Vitamin D deficiency 12/18/2013  . Medication management 12/18/2013  . Paroxysmal supraventricular tachycardia ([place]) 10/18/2013  . Radiculopathy 10/16/2013  . Malignant neoplasm of lower-inner quadrant of left breast in female, estrogen receptor positive ([place]) 05/20/2013  . Hyperlipidemia 03/13/2008  . Depression, major, recurrent, in partial remission ([place]) 03/13/2008  . Essential hypertension 03/13/2008  . H/O left bundle branch block 03/13/2008  . Allergic rhinitis 03/13/2008  . GERD 03/13/2008    Screening Tests Immunization History  Administered Date(s) Administered  . Influenza Whole 01/01/2013  . Influenza, High Dose Seasonal PF 01/29/2014, 12/30/2014  . Influenza-Unspecified 12/11/2015, 02/04/2017, 01/18/2018  . Pneumococcal Conjugate-13 06/16/2014  . Pneumococcal Polysaccharide-23 05/23/2011  . Tdap 03/11/2008  . Zoster 02/02/2010   Preventative care: Last  colonoscopy: 2014 Last mammogram: 05/2018 (05/06/2013 + right breast cancer s/p lumpectomy) Last pap smear/pelvic exam: remote   DEXA: 05/2018 - L forearm T -1.4, otherwise in normal range Renal US 08/2015 Echo 2008  Prior vaccinations: TD or Tdap: 2009 due after 03/2018, DUE Influenza: 2019 Pneumococcal: 2013 Prevnar 13: 2016 Shingles/Zostavax: 2011  Names of Other Physician/Practitioners you currently use: 1. [facility] here for primary care 2. Dr Nicki Reaper, eye doctor, last visit April 2015, has scheduled in July 2020 3. [facility], dentist, last visit 2020, q 6 months  Patient Care Team: Unk Pinto, MD as PCP - General (Internal Medicine) Leonie Man, MD as PCP - Cardiology (Cardiology) Magrinat, Virgie Dad, MD as Consulting Physician (Oncology) Leonie Man, MD as Consulting Physician (Cardiology) Dorna Leitz, MD as Consulting Physician (Orthopedic  Surgery) Rolm Bookbinder, MD as Consulting Physician (General Surgery) Eppie Gibson, MD as Attending Physician (Radiation Oncology)  SURGICAL HISTORY She  has a past surgical history that includes Cataract extraction bilateral w/ anterior vitrectomy (Bilateral); Abdominal hysterectomy; Cholecystectomy; Knee arthroscopy (Bilateral); Tonsillectomy; Colonoscopy (2014); Upper gi endoscopy; Dupuytren / palmar fasciotomy (Bilateral, 2007); Tubal ligation; epidural injections; Lumbar fusion (10/2013); Cardiac catheterization (N/A, 08/10/2015); transthoracic echocardiogram (08/07/2015); transthoracic echocardiogram (10/2015); Joint replacement; Total knee arthroplasty (Left, 09/23/2016); Breast lumpectomy (Left); Foot Tendon Surgery (Right, ~ 11/2015); and Total knee arthroplasty (Left, 09/23/2016). FAMILY HISTORY Her family history includes COPD in her father; Heart attack in her mother; Hypertension in her father; Stroke in her mother. SOCIAL HISTORY She  reports that she has  never smoked. She has never used smokeless tobacco. She reports current alcohol use. She reports that she does not use drugs.   MEDICARE WELLNESS OBJECTIVES: Physical activity:   Cardiac risk factors:   Depression/mood screen:   Depression screen [facility] 2/9 04/23/2018  Decreased Interest 0  Down, Depressed, Hopeless 0  PHQ - 2 Score 0  Some recent data might be hidden    ADLs:  In your present state of health, do you have any difficulty performing the following activities: 04/23/2018  Hearing? N  Vision? N  Difficulty concentrating or making decisions? N  Walking or climbing stairs? N  Dressing or bathing? N  Doing errands, shopping? N  Some recent data might be hidden     Cognitive Testing  Alert? Yes  Normal Appearance?Yes  Oriented to person? Yes  Place? Yes   Time? Yes  Recall of three objects?  Yes  Can perform simple calculations? Yes  Displays appropriate judgment?Yes  Can read the correct time from a watch face?Yes  EOL planning: Does Patient Have a Medical Advance Directive?: No Would patient like information on creating a medical advance directive?: No - Patient declined  Review of Systems  Constitutional: Negative for malaise/fatigue and weight loss.  HENT: Negative for hearing loss and tinnitus.   Eyes: Negative for blurred vision and double vision.  Respiratory: Negative for cough, sputum production, shortness of breath and wheezing.   Cardiovascular: Negative for chest pain, palpitations, orthopnea, claudication, leg swelling and PND.  Gastrointestinal: Negative for abdominal pain, blood in stool, constipation, diarrhea, heartburn, melena, nausea and vomiting.  Genitourinary: Negative.   Musculoskeletal: Negative for falls, joint pain and myalgias.  Skin: Negative for rash.  Neurological: Negative for dizziness, tingling, sensory change, weakness and headaches.  Endo/Heme/Allergies: Negative for polydipsia.  Psychiatric/Behavioral: Negative.  Negative for  depression, memory loss, substance abuse and suicidal ideas. The patient is not nervous/anxious and does not have insomnia.   All other systems reviewed and are negative.    Objective:     Today's Vitals   09/24/18 1500  BP: 110/62  Pulse: 80  Temp: (!) 97.4 F (36.3 C)  SpO2: 97%  Weight: 236 lb (107 kg)  PainSc: 7   PainLoc: Back   Body mass index is 39.88 kg/m.  General appearance: alert, no distress, WD/WN, female HEENT: normocephalic, sclerae anicteric, TMs pearly, nares patent, no discharge or erythema, pharynx normal Oral cavity: MMM, no lesions Neck: supple, no lymphadenopathy, no thyromegaly, no masses Heart: RRR, normal S1, S2, no murmurs Lungs: CTA bilaterally, no wheezes, rhonchi, or rales Abdomen: +bs, soft, non tender, non distended, no masses, no hepatomegaly, no splenomegaly Musculoskeletal: nontender, no swelling, no obvious deformity Extremities: no edema, no cyanosis, no clubbing Pulses: 2+ symmetric, upper and lower extremities, normal  cap refill Neurological: alert, oriented x 3, CN2-12 intact, strength normal upper extremities and lower extremities, sensation normal throughout, DTRs 2+ throughout, no cerebellar signs, gait normal Psychiatric: normal affect, behavior normal, pleasant   Medicare Attestation I have personally reviewed: The patient's medical and social history Their use of alcohol, tobacco or illicit drugs Their current medications and supplements The patient's functional ability including ADLs,fall risks, home safety risks, cognitive, and hearing and visual impairment Diet and physical activities Evidence for depression or mood disorders  The patient's weight, height, BMI, and visual acuity have been recorded in the chart.  I have made referrals, counseling, and provided education to the patient based on review of the above and I have provided the patient with a written personalized care plan for preventive services.     Izora Ribas, NP   09/24/2018

## 2018-09-24 ENCOUNTER — Other Ambulatory Visit: Payer: Self-pay

## 2018-09-24 ENCOUNTER — Encounter: Payer: Self-pay | Admitting: Adult Health

## 2018-09-24 ENCOUNTER — Ambulatory Visit: Payer: Medicare Other | Admitting: Adult Health

## 2018-09-24 VITALS — BP 110/62 | HR 80 | Temp 97.4°F | Wt 236.0 lb

## 2018-09-24 DIAGNOSIS — I5042 Chronic combined systolic (congestive) and diastolic (congestive) heart failure: Secondary | ICD-10-CM | POA: Diagnosis not present

## 2018-09-24 DIAGNOSIS — E782 Mixed hyperlipidemia: Secondary | ICD-10-CM

## 2018-09-24 DIAGNOSIS — I471 Supraventricular tachycardia: Secondary | ICD-10-CM

## 2018-09-24 DIAGNOSIS — D649 Anemia, unspecified: Secondary | ICD-10-CM

## 2018-09-24 DIAGNOSIS — M5136 Other intervertebral disc degeneration, lumbar region: Secondary | ICD-10-CM

## 2018-09-24 DIAGNOSIS — E559 Vitamin D deficiency, unspecified: Secondary | ICD-10-CM

## 2018-09-24 DIAGNOSIS — N183 Chronic kidney disease, stage 3 unspecified: Secondary | ICD-10-CM

## 2018-09-24 DIAGNOSIS — E039 Hypothyroidism, unspecified: Secondary | ICD-10-CM

## 2018-09-24 DIAGNOSIS — I480 Paroxysmal atrial fibrillation: Secondary | ICD-10-CM

## 2018-09-24 DIAGNOSIS — Z0001 Encounter for general adult medical examination with abnormal findings: Secondary | ICD-10-CM | POA: Diagnosis not present

## 2018-09-24 DIAGNOSIS — Z170 Estrogen receptor positive status [ER+]: Secondary | ICD-10-CM

## 2018-09-24 DIAGNOSIS — Z0000 Encounter for general adult medical examination without abnormal findings: Secondary | ICD-10-CM

## 2018-09-24 DIAGNOSIS — Z79899 Other long term (current) drug therapy: Secondary | ICD-10-CM

## 2018-09-24 DIAGNOSIS — M5416 Radiculopathy, lumbar region: Secondary | ICD-10-CM

## 2018-09-24 DIAGNOSIS — F3341 Major depressive disorder, recurrent, in partial remission: Secondary | ICD-10-CM

## 2018-09-24 DIAGNOSIS — R6889 Other general symptoms and signs: Secondary | ICD-10-CM

## 2018-09-24 DIAGNOSIS — C50312 Malignant neoplasm of lower-inner quadrant of left female breast: Secondary | ICD-10-CM

## 2018-09-24 DIAGNOSIS — Z8679 Personal history of other diseases of the circulatory system: Secondary | ICD-10-CM

## 2018-09-24 DIAGNOSIS — J3089 Other allergic rhinitis: Secondary | ICD-10-CM

## 2018-09-24 DIAGNOSIS — K219 Gastro-esophageal reflux disease without esophagitis: Secondary | ICD-10-CM

## 2018-09-24 DIAGNOSIS — I428 Other cardiomyopathies: Secondary | ICD-10-CM

## 2018-09-24 DIAGNOSIS — M1712 Unilateral primary osteoarthritis, left knee: Secondary | ICD-10-CM

## 2018-09-24 DIAGNOSIS — I10 Essential (primary) hypertension: Secondary | ICD-10-CM

## 2018-09-24 DIAGNOSIS — R7309 Other abnormal glucose: Secondary | ICD-10-CM

## 2018-09-24 DIAGNOSIS — Z23 Encounter for immunization: Secondary | ICD-10-CM

## 2018-09-24 NOTE — Addendum Note (Signed)
Addended by: Chancy Hurter on: 09/24/2018 03:57 PM   Modules accepted: Orders

## 2018-09-24 NOTE — Patient Instructions (Addendum)
  Ms. Wysong , Thank you for taking time to come for your Medicare Wellness Visit. I appreciate your ongoing commitment to your health goals. Please review the following plan we discussed and let me know if I can assist you in the future.   These are the goals we discussed: Goals    . Exercise 3-5x per week (15 min per time)    . MONITOR WATER INTAKE     Aim for 55-65 fluid ounces daily.     . Weight (lb) < 220 lb (99.8 kg)       This is a list of the screening recommended for you and due dates:  Health Maintenance  Topic Date Due  .  Hepatitis C: One time screening is recommended by Center for Disease Control  (CDC) for  adults born from [birth year] through 1965.   1945-04-14  . Tetanus Vaccine  03/11/2018  . Flu Shot  11/10/2018  . Mammogram  06/08/2020  . Colon Cancer Screening  03/20/2023  . DEXA scan (bone density measurement)  Completed  . Pneumonia vaccines  Completed    [employer] with no obligation # [phone] Do not have to be a member Tues-Sat 10-6  [place]- free test with no obligation # [phone] MUST BE A MEMBER Call for store hours  Have had patient's get good cheaper hearing aids from [employer] The air version has good reviews.

## 2018-09-25 ENCOUNTER — Other Ambulatory Visit: Payer: Self-pay | Admitting: Adult Health

## 2018-09-25 DIAGNOSIS — E876 Hypokalemia: Secondary | ICD-10-CM

## 2018-09-25 DIAGNOSIS — N289 Disorder of kidney and ureter, unspecified: Secondary | ICD-10-CM

## 2018-09-25 LAB — COMPLETE METABOLIC PANEL WITH GFR
AG Ratio: 1.6 (calc) (ref 1.0–2.5)
ALT: 12 U/L (ref 6–29)
AST: 14 U/L (ref 10–35)
Albumin: 3.9 g/dL (ref 3.6–5.1)
Alkaline phosphatase (APISO): 84 U/L (ref 37–153)
BUN/Creatinine Ratio: 18 (calc) (ref 6–22)
BUN: 22 mg/dL (ref 7–25)
CO2: 27 mmol/L (ref 20–32)
Calcium: 9 mg/dL (ref 8.6–10.4)
Chloride: 103 mmol/L (ref 98–110)
Creat: 1.25 mg/dL — ABNORMAL HIGH (ref 0.60–0.93)
GFR, Est African American: 50 mL/min/{1.73_m2} — ABNORMAL LOW (ref >=60)
GFR, Est Non African American: 43 mL/min/{1.73_m2} — ABNORMAL LOW (ref >=60)
Globulin: 2.5 g/dL (calc) (ref 1.9–3.7)
Glucose, Bld: 83 mg/dL (ref 65–99)
Potassium: 3.2 mmol/L — ABNORMAL LOW (ref 3.5–5.3)
Sodium: 140 mmol/L (ref 135–146)
Total Bilirubin: 0.2 mg/dL (ref 0.2–1.2)
Total Protein: 6.4 g/dL (ref 6.1–8.1)

## 2018-09-25 LAB — CBC WITH DIFFERENTIAL/PLATELET
Absolute Monocytes: 784 cells/uL (ref 200–950)
Basophils Absolute: 59 cells/uL (ref 0–200)
Basophils Relative: 0.6 %
Eosinophils Absolute: 108 cells/uL (ref 15–500)
Eosinophils Relative: 1.1 %
HCT: 29.8 % — ABNORMAL LOW (ref 35.0–45.0)
Hemoglobin: 9.5 g/dL — ABNORMAL LOW (ref 11.7–15.5)
Lymphs Abs: 1568 cells/uL (ref 850–3900)
MCH: 27.9 pg (ref 27.0–33.0)
MCHC: 31.9 g/dL — ABNORMAL LOW (ref 32.0–36.0)
MCV: 87.4 fL (ref 80.0–100.0)
MPV: 11.2 fL (ref 7.5–12.5)
Monocytes Relative: 8 %
Neutro Abs: 7281 cells/uL (ref 1500–7800)
Neutrophils Relative %: 74.3 %
Platelets: 246 10*3/uL (ref 140–400)
RBC: 3.41 10*6/uL — ABNORMAL LOW (ref 3.80–5.10)
RDW: 13.4 % (ref 11.0–15.0)
Total Lymphocyte: 16 %
WBC: 9.8 10*3/uL (ref 3.8–10.8)

## 2018-09-25 LAB — LIPID PANEL
Cholesterol: 136 mg/dL (ref <200)
HDL: 51 mg/dL (ref >=50)
LDL Cholesterol (Calc): 67 mg/dL (calc)
Non-HDL Cholesterol (Calc): 85 mg/dL (calc) (ref <130)
Total CHOL/HDL Ratio: 2.7 (calc) (ref <5.0)
Triglycerides: 97 mg/dL (ref <150)

## 2018-09-25 LAB — HEMOGLOBIN A1C
Hgb A1c MFr Bld: 6.1 % of total Hgb — ABNORMAL HIGH (ref <5.7)
Mean Plasma Glucose: 128 (calc)
eAG (mmol/L): 7.1 (calc)

## 2018-09-25 LAB — MAGNESIUM: Magnesium: 2.2 mg/dL (ref 1.5–2.5)

## 2018-09-25 LAB — TSH: TSH: 1.28 mIU/L (ref 0.40–4.50)

## 2018-10-02 ENCOUNTER — Other Ambulatory Visit: Payer: Self-pay | Admitting: Physician Assistant

## 2018-10-04 ENCOUNTER — Ambulatory Visit: Payer: Medicare Other

## 2018-10-04 ENCOUNTER — Other Ambulatory Visit: Payer: Self-pay

## 2018-10-04 DIAGNOSIS — N289 Disorder of kidney and ureter, unspecified: Secondary | ICD-10-CM | POA: Diagnosis not present

## 2018-10-04 DIAGNOSIS — E876 Hypokalemia: Secondary | ICD-10-CM

## 2018-10-04 NOTE — Progress Notes (Signed)
Patient presents to the office for a nurse visit to have labs done to check renal function. No changes to medications. Vitals taken and recorded.

## 2018-10-05 LAB — BASIC METABOLIC PANEL WITH GFR
BUN/Creatinine Ratio: 21 (calc) (ref 6–22)
BUN: 20 mg/dL (ref 7–25)
CO2: 30 mmol/L (ref 20–32)
Calcium: 8.8 mg/dL (ref 8.6–10.4)
Chloride: 102 mmol/L (ref 98–110)
Creat: 0.96 mg/dL — ABNORMAL HIGH (ref 0.60–0.93)
GFR, Est African American: 68 mL/min/{1.73_m2} (ref >=60)
GFR, Est Non African American: 59 mL/min/{1.73_m2} — ABNORMAL LOW (ref >=60)
Glucose, Bld: 124 mg/dL — ABNORMAL HIGH (ref 65–99)
Potassium: 3.8 mmol/L (ref 3.5–5.3)
Sodium: 139 mmol/L (ref 135–146)

## 2018-10-11 ENCOUNTER — Other Ambulatory Visit: Payer: Self-pay | Admitting: Physician Assistant

## 2018-10-11 ENCOUNTER — Other Ambulatory Visit: Payer: Self-pay | Admitting: Adult Health

## 2018-10-15 ENCOUNTER — Other Ambulatory Visit: Payer: Self-pay | Admitting: Internal Medicine

## 2018-10-15 DIAGNOSIS — I480 Paroxysmal atrial fibrillation: Secondary | ICD-10-CM

## 2018-10-15 DIAGNOSIS — I10 Essential (primary) hypertension: Secondary | ICD-10-CM

## 2018-10-15 DIAGNOSIS — I5042 Chronic combined systolic (congestive) and diastolic (congestive) heart failure: Secondary | ICD-10-CM

## 2018-10-15 DIAGNOSIS — I428 Other cardiomyopathies: Secondary | ICD-10-CM

## 2018-10-22 ENCOUNTER — Other Ambulatory Visit: Payer: Self-pay | Admitting: Adult Health

## 2018-10-22 ENCOUNTER — Telehealth: Payer: Self-pay

## 2018-10-22 MED ORDER — PREDNISONE 20 MG PO TABS: ORAL_TABLET | ORAL | 0 refills | Status: DC

## 2018-10-22 NOTE — Telephone Encounter (Signed)
Patient aware and will start the Prednisone, only had one dose of the Famotidine, nothing else was new for meds.

## 2018-10-22 NOTE — Telephone Encounter (Signed)
Broke out with hives, started on Friday and at that time started taking Famotidine on Thursday. Please advise.

## 2018-10-23 ENCOUNTER — Telehealth: Payer: Self-pay

## 2018-10-23 MED ORDER — OMEPRAZOLE 40 MG PO CPDR: DELAYED_RELEASE_CAPSULE | ORAL | 3 refills | Status: DC

## 2018-10-23 NOTE — Telephone Encounter (Signed)
Omeprazole refill request. Patient states that she she takes twice daily and her current prescription is for once daily.

## 2018-10-28 NOTE — Progress Notes (Signed)
Cardiology Office Note   Date:  10/29/2018   ID:  [name], [name] November 18, 1945, MRN [id number]  PCP:  Unk Pinto, MD  Cardiologist: Dr. Ellyn Hack  No chief complaint on file.    History of Present Illness: [name] [name] is a 73 y.o. female who presents for ongoing assessment and management of nonischemic cardiomyopathy with EF of 45% to 50%,, chronic combined systolic and diastolic heart failure, New York Heart Association class II left bundle branch block, hyperlipidemia, and atrial fibrillation.  She is [name].    Other history includes GERD, prediabetes, hypothyroidism, chronic kidney disease stage III, degenerative disc disease, seasonal allergies, anxiety and depression and morbid obesity.  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  She was last seen by Dr. Ellyn Hack 03/23/2018 with complaints of pinched nerve in her neck with radiculopathy and neuropathy symptoms.  She occasionally felt fluttering on and off, but occurs intermittently.  She was not very active due to chronic pain in her neck and shoulder.  The patient was not planned for any medication changes or testing on that office visit.  The patient was recommended for vagal maneuvers should she have recurrence of rapid heart rate that continued for more than a minute.  She presents to the clinic today and states she feels well.  She continues to work in [employer] 5-1/2 hours a day at the [employer].  She also has a little dog that she walks every 2 hours for about 15 minutes while she is at home.  She states that she has flutter/palpitations about 1 time per month that are easily corrected by vagal maneuvers.  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  She denies chest pain, shortness  of breath, lower extremity edema, fatigue, weakness, presyncope, syncope, increased palpitations, melena, hematuria, hemoptysis, orthopnea, and PND.  Past Medical History:  Diagnosis Date  . Allergy    Nasonex daily as needed  . Anemia   . Anxiety   . Arthritis   . 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 Chronic back pain    cyst sitting on L4-5;slipped disc  . Depression    takes Cymbalta daily  . Diverticulosis   . Dysrhythmia   . GERD (gastroesophageal reflux disease)    takes Omeprazole daily  . Headache(784.0)    rare  . History of gastric ulcer at age 16  . History of pancreatitis 03/27/2008  . History of shingles   . History of ulcer disease 08/06/2015  . Hyperlipidemia   . Hypertension    takes Cardura nightly and Verapamil daily  . Hypothyroidism   . Insomnia    takes Restoril nightly  . Joint pain   . Nonischemic cardiomyopathy ([place]) 07/2015   Normal coronary arteries by cath. EF was 25-30% by echo. GR2 DD - likely related to LBBB in setting of A. fib  . Obesity (BMI 30-39.9) 06/11/2013  . Paroxysmal atrial fibrillation [facility]): CHA2DS2-VASc Score - starting Eliquis 08/06/2015   This patients CHA2DS2-VASc Score and unadjusted Ischemic Stroke Rate (% per year) is equal to 4.8 % stroke rate/year from a score of 4  Above score calculated as 1 point each if present [CHF, HTN, DM, Vascular=MI/PAD/Aortic Plaque, Age  if 65-74, or Female]; 2 points each if present [Age > 75, or Stroke/TIA/TE]  . PONV (postoperative nausea and vomiting)   . S/P radiation therapy 07/18/2013-08/28/2013   1) Left Breast / 50 Gy in 25 fractions/ 2) Left Breast Boost / 10 Gy in 5 fractions  . Shingles   . Sinus drainage    put on Levaquin yesterday if no better in 3 days will start prednisone  . Thyroid disease   . Weakness    tingling and numbness both hands and left leg  . Wears glasses     Past Surgical History:  Procedure Laterality Date  .  ABDOMINAL HYSTERECTOMY    . BREAST LUMPECTOMY Left    neg  . CARDIAC CATHETERIZATION N/A 08/10/2015   Procedure: Right/Left Heart Cath and Coronary Angiography;  Surgeon: Burnell Blanks, MD;  Location: [place] [facility];  Service: Cardiovascular;: Nonobstructive CAD  . CATARACT EXTRACTION BILATERAL W/ ANTERIOR VITRECTOMY Bilateral   . CHOLECYSTECTOMY    . COLONOSCOPY  2014  . DUPUYTREN / PALMAR FASCIOTOMY Bilateral 2007   x 3 to left and once to the right   . epidural injections     x 3  . FOOT TENDON SURGERY Right ~ 11/2015   "stepped in a hole and tore 2 tendons"  . JOINT REPLACEMENT    . KNEE ARTHROSCOPY Bilateral   . LUMBAR FUSION  10/2013   Dr. Lynann Bologna  . TONSILLECTOMY    . TOTAL KNEE ARTHROPLASTY Left 09/23/2016  . TOTAL KNEE ARTHROPLASTY Left 09/23/2016   Procedure: TOTAL KNEE ARTHROPLASTY;  Surgeon: Dorna Leitz, MD;  Location: [place];  Service: Orthopedics;  Laterality: Left;  . TRANSTHORACIC ECHOCARDIOGRAM  08/07/2015   Severely reduced LVEF at 25-30% with diffuse HK. GR 2 DD. Ventricular dyssynergy secondary to LBBB. Small to moderate pericardial effusion but no hemodynamic compromise  . TRANSTHORACIC ECHOCARDIOGRAM  10/2015    (EF up from 25-30%): - Left ventricle: Poor acoustic windows -  difficult to see.    EF estimated at 45-50% with inferoseptal and possible posterior hypokinesis. GR 1 DD. No pericardial effusion.  . TUBAL LIGATION    . UPPER GI ENDOSCOPY       Current Outpatient Medications  Medication Sig Dispense Refill  . buPROPion (WELLBUTRIN XL) 150 MG 24 hr tablet TAKE 1 TABLET BY MOUTH EVERY DAY IN THE MORNING 90 tablet 1  . carvedilol (COREG) 25 MG tablet Take 1 tablet 2 x /day for BP & Heart 180 tablet 3  . chlorhexidine (PERIDEX) 0.12 % solution SWISH 1 CAPFUL FOR 30 SECONDS TWICE A DAY *SPIT OUT AFTER USE* *START 1 WEEK PRIOR TO SURGERY*    . Cholecalciferol (VITAMIN D) 2000 UNITS tablet Take 4,000 Units by mouth daily.    . Coenzyme Q10 (COQ10)  100 MG CAPS Take 300 mg by mouth daily. 90 each 11  . docusate sodium (COLACE) 100 MG capsule Take 1 capsule (100 mg total) by mouth 2 (two) times daily. 30 capsule 0  . DULoxetine (CYMBALTA) 30 MG capsule TAKE ONE CAPSULE 3 TIMES A DAY 270 capsule 3  . ELIQUIS 5 MG TABS tablet TAKE 1 TABLET BY MOUTH TWICE A DAY 180 tablet 1  . famotidine (PEPCID) 20 MG tablet Take 1 tablet 2 x /day with meals for Acid Indigestion 180 tablet 3  . ferrous sulfate 325 (65 FE) MG tablet TAKE 1 TABLET (325 MG TOTAL) BY MOUTH 2 (TWO) TIMES DAILY WITH A MEAL. 180 tablet 3  . furosemide (  LASIX) 20 MG tablet Take 1 tablet 2 x /day for BP & Fluid 180 tablet 3  . gabapentin (NEURONTIN) 300 MG capsule Take 1 capsule (300 mg total) by mouth 2 (two) times daily. 90 capsule 1  . levothyroxine (SYNTHROID) 50 MCG tablet Take 1/2 to 1 tablet daily as directed on an empty stomach with only water for 30 minutes & no Antacid meds, Calcium or Magnesium for 4 hours & avoid Biotin 90 tablet 3  . losartan (COZAAR) 100 MG tablet TAKE 1 TABLET BY MOUTH EVERY DAY REPLACES VALSARTAN 90 tablet 3  . mometasone (NASONEX) 50 MCG/ACT nasal spray USE 1 TO 2 SPRAYS IN EACH NOSTRIL TWICE DAILY 17 g 6  . nystatin ointment (MYCOSTATIN) Apply 1 application topically 2 (two) times daily. 30 g 0  . omeprazole (PRILOSEC) 40 MG capsule Take 1 capsule twice daily for Acid Indigestion & Reflux. 180 capsule 3  . predniSONE (DELTASONE) 20 MG tablet 2 tablets daily for 3 days, 1 tablet daily for 4 days. 10 tablet 0  . rosuvastatin (CRESTOR) 40 MG tablet TAKE 1 TABLET EVERY DAY, EXCEPT TAKE 2 TABLETS ON MONDAY 102 tablet 1  . temazepam (RESTORIL) 30 MG capsule TAKE 1 CAPSULE BY MOUTH AT BEDTIME ONLY IF NEEDED 90 capsule 0  . triamcinolone ointment (KENALOG) 0.1 % Apply 1 application topically 2 (two) times daily. 80 g 1  . ipratropium (ATROVENT) 0.03 % nasal spray Place 2 sprays into the nose 3 (three) times daily. 54 mL 3   No current facility-administered  medications for this visit.     Allergies:   Requip [ropinirole hcl], Minocycline, Tetracyclines & related, Zestril [lisinopril], Zetia [ezetimibe], Amoxicillin, Codeine, Morphine sulfate, and Sulfa antibiotics    Social History:  The patient  reports that she has never smoked. She has never used smokeless tobacco. She reports current alcohol use. She reports that she does not use drugs.   Family History:  The patient's family history includes COPD in her father; Heart attack in her mother; Hypertension in her father; Stroke in her mother.    ROS: All other systems are reviewed and negative. Unless otherwise mentioned in H&P    PHYSICAL EXAM: VS:  BP 98/63   Pulse 86   Ht 5' 4.4" (1.636 m)   Wt 237 lb (107.5 kg)   BMI 40.18 kg/m  , BMI Body mass index is 40.18 kg/m. GEN: Well nourished, well developed, in no acute distress HEENT: normal Neck: no JVD, carotid bruits, or masses Cardiac:RRR; no murmurs, rubs, or gallops,no edema  Respiratory:  Clear to auscultation bilaterally, normal work of breathing GI: soft, nontender, nondistended, + BS MS: no deformity or atrophy Skin: warm and dry, no rash Neuro:  Strength and sensation are intact Psych: euthymic mood, full affect   EKG:  EKG is ordered today. The ekg ordered today demonstrates normal sinus rhythm with bundle branch block 78 bpm no acute changes.   Recent Labs: 09/24/2018: ALT 12; Hemoglobin 9.5; Magnesium 2.2; Platelets 246; TSH 1.28 10/04/2018: BUN 20; Creat 0.96; Potassium 3.8; Sodium 139    Lipid Panel    Component Value Date/Time   CHOL 136 09/24/2018 1602   TRIG 97 09/24/2018 1602   HDL 51 09/24/2018 1602   CHOLHDL 2.7 09/24/2018 1602   VLDL 39 (H) 08/10/2016 1452   LDLCALC 67 09/24/2018 1602      Wt Readings from Last 3 Encounters:  10/29/18 237 lb (107.5 kg)  09/24/18 236 lb (107 kg)  04/23/18 230 lb (  104.3 kg)      Other studies Reviewed: Echocardiogram 11-11-15 Left ventricle: Poor acoustic  windows limit study Endocardium is   difficult to see   Overall LVEF is approximatey 45 to 50% with inferooseptal   hypokeihnesis and possible posterior hypokinesis. The cavity size   was normal. Wall thickness was normal. Doppler parameters are   consistent with abnormal left ventricular relaxation (grade 1   diastolic dysfunction).   Cardiac Cath 08/10/2015  1. Non-ischemic cardiomyopathy 2. No angiographic evidence of CAD 3. Normal filling pressures.   Recommendations: Medical management of non-ischemic cardiomyopathy. New diagnosis of atrial fibrillation. Her cardiomyopathy could be rate related.   ASSESSMENT AND PLAN:  1.  Chronic combined systolic diastolic heart failure- echocardiogram (10/2015) LVEF 45 to 00%, grade 1 diastolic dysfunction, no shortness of breath, no chest pain. Continue carvedilol 25 mg tablet twice daily Continue losartan 100 mg tablet daily Continue furosemide 20 mg tablet twice daily Heart healthy low-sodium diet Maintain physical activity- frequent walks throughout the day  2.  Nonischemic cardiomyopathy- cardiac catheterization (08/10/2015), normal coronary arteries (08/10/2015) Inferoseptal hypokeihnesis and possible posterior hypokinesis.  Grade 1 diastolic dysfunction Continue carvedilol 25 mg tablet twice daily Continue losartan 100 mg tablet daily Continue furosemide 20 mg tablet twice daily Heart healthy low-sodium diet Maintain physical activity  3.  Atrial fibrillation- EKG normal sinus rhythm 80 bpm (03/23/2018) Continue Eliquis 5 mg tablet twice daily Continue carvedilol 25 mg tablet twice daily Continue vagal maneuver during episodes of A. fib  4.  Hyperlipidemia-09/24/2018: Cholesterol 136; HDL 51; LDL Cholesterol (Calc) 67; Triglycerides 97 Continue rosuvastatin 40 mg tablet take 1 tablet every other day except 2 tablets on Monday Continue co-Q10 300 mg capsule daily Lab work monitored by PCP  5.  Obesity (BMI 30-39.9) Heart healthy  low-sodium diet Continue physical activity as tolerated   Disposition: Follow-up with Dr. Ellyn Hack in 6 months.  Current medicines are reviewed at length with the patient today.    Labs/ tests ordered today include: None today  Jossie Ng. Diera Wirkkala NP-C    10/29/2018 3:27 PM    [facility] [address] Office [phone] Fax [phone]

## 2018-10-29 ENCOUNTER — Ambulatory Visit (INDEPENDENT_AMBULATORY_CARE_PROVIDER_SITE_OTHER): Payer: Medicare Other | Admitting: General Practice

## 2018-10-29 ENCOUNTER — Encounter: Payer: Self-pay | Admitting: Adult Health

## 2018-10-29 ENCOUNTER — Telehealth: Payer: Self-pay | Admitting: Adult Health

## 2018-10-29 ENCOUNTER — Other Ambulatory Visit: Payer: Self-pay

## 2018-10-29 VITALS — BP 98/63 | HR 86 | Ht 64.4 in | Wt 237.0 lb

## 2018-10-29 DIAGNOSIS — I480 Paroxysmal atrial fibrillation: Secondary | ICD-10-CM

## 2018-10-29 DIAGNOSIS — E782 Mixed hyperlipidemia: Secondary | ICD-10-CM

## 2018-10-29 DIAGNOSIS — I5042 Chronic combined systolic (congestive) and diastolic (congestive) heart failure: Secondary | ICD-10-CM

## 2018-10-29 DIAGNOSIS — I428 Other cardiomyopathies: Secondary | ICD-10-CM

## 2018-10-29 DIAGNOSIS — E669 Obesity, unspecified: Secondary | ICD-10-CM

## 2018-10-29 NOTE — Patient Instructions (Signed)
Medication Instructions:  Continue current medications  If you need a refill on your cardiac medications before your next appointment, please call your pharmacy.  Labwork: None Ordered   Testing/Procedures: None Ordered   Follow-Up: You will need a follow up appointment in 6 months.  Please call our office 2 months in advance to schedule this appointment.  You may see Glenetta Hew, MD or one of the following Advanced Practice Providers on your designated Care Team:   Rosaria Ferries, PA-C . Jory Sims, DNP, ANP     At [facility], you and your health needs are our priority.  As part of our continuing mission to provide you with exceptional heart care, we have created designated Provider Care Teams.  These Care Teams include your primary Cardiologist (physician) and Advanced Practice Providers (APPs -  Physician Assistants and Nurse Practitioners) who all work together to provide you with the care you need, when you need it.  Thank you for choosing [facility] at [facility]!!

## 2018-10-29 NOTE — Telephone Encounter (Signed)
LVM, reminding pt of her appt on 10-29-18 with Jory Sims.

## 2018-10-30 NOTE — Addendum Note (Signed)
Addended by: Diana Eves on: 10/30/2018 04:02 PM   Modules accepted: Orders

## 2018-11-28 ENCOUNTER — Other Ambulatory Visit: Payer: Self-pay | Admitting: Physical Medicine and Rehabilitation

## 2018-11-28 DIAGNOSIS — M542 Cervicalgia: Secondary | ICD-10-CM

## 2018-12-05 ENCOUNTER — Ambulatory Visit
Admission: RE | Admit: 2018-12-05 | Discharge: 2018-12-05 | Disposition: A | Discharge status: Home / Self Care | Payer: Medicare Other | Source: Ambulatory Visit | Attending: Physical Medicine and Rehabilitation | Admitting: Physical Medicine and Rehabilitation

## 2018-12-05 ENCOUNTER — Other Ambulatory Visit: Payer: Self-pay

## 2018-12-05 DIAGNOSIS — M542 Cervicalgia: Secondary | ICD-10-CM

## 2018-12-05 MED ORDER — IOPAMIDOL (ISOVUE-M 300) INJECTION 61%
1.0000 mL | Freq: Once | INTRAMUSCULAR | Status: AC
  Administered 2018-12-05: 1 mL via EPIDURAL

## 2018-12-05 MED ORDER — TRIAMCINOLONE ACETONIDE 40 MG/ML IJ SUSP (RADIOLOGY)
60.0000 mg | Freq: Once | INTRAMUSCULAR | Status: AC
  Administered 2018-12-05: 60 mg via EPIDURAL

## 2018-12-05 NOTE — Discharge Instructions (Signed)

## 2018-12-13 ENCOUNTER — Other Ambulatory Visit: Payer: Self-pay | Admitting: Adult Health

## 2018-12-13 ENCOUNTER — Telehealth: Payer: Self-pay

## 2018-12-13 MED ORDER — KETOCONAZOLE 2 % EX SHAM: 1.0000 "application " | MEDICATED_SHAMPOO | CUTANEOUS | 0 refills | Status: DC

## 2018-12-13 NOTE — Telephone Encounter (Signed)
States that she has a bad break out again on her scalp. Requesting a refill on her ketoconazole. Please advise

## 2019-01-07 ENCOUNTER — Other Ambulatory Visit: Payer: Self-pay | Admitting: Internal Medicine

## 2019-01-10 ENCOUNTER — Other Ambulatory Visit: Payer: Self-pay | Admitting: Adult Health

## 2019-01-14 NOTE — Progress Notes (Signed)
CPE  Assessment:   Essential hypertension - continue medications, DASH diet, exercise and monitor at home. Call if greater than 130/80.   -     CBC with Differential/Platelet -     CMP/GFR -     TSH  Paroxysmal atrial fibrillation (HCC) Control blood pressure, cholesterol, glucose, increase exercise.  Continue cardio follow up  Chronic combined systolic and diastolic heart failure, NYHA class 2 ([place]) -- exacerbated by A. Fib Weight up slightly but no PND, edema, followed by cardiology  Non-ischemic cardiomyopathy ([place]) Decrease portions, increase exercise Optimize meds  Paroxysmal supraventricular tachycardia (HCC) Control blood pressure, cholesterol, glucose, increase exercise.  Continue cardio follow up  Non-seasonal allergic rhinitis, unspecified trigger - Allegra OTC, increase H20, allergy hygiene explained.  Gastroesophageal reflux disease without esophagitis Continue PPI/H2 blocker, diet discussed  Other specified hypothyroidism Hypothyroidism-check TSH level, continue medications the same, reminded to take on an empty stomach 30-2mins before food.  -     TSH  Lumbar radiculopathy Follow up ortho/pain management  DDD (degenerative disc disease), lumbar Follow up ortho  Hyperlipidemia -continue medications, check lipids, decrease fatty foods, increase activity.  -     Lipid panel  H/O left bundle branch block Continue cardio follow up  ANXIETY DEPRESSION Continue meds  History of pancreatitis no ETOH  Epigastric pain Check labs If not better may need to get imaging Avoid ETOH, NSAIDS Please go to the ER if you have any severe AB pain, unable to hold down food/water, blood in stool or vomit, chest pain, shortness of breath, or any worsening symptoms.   Malignant neoplasm of lower-inner quadrant of left breast in female, estrogen receptor positive ([place]) Continue follow up oncology, continue meds  Morbid Obesity with co morbidities - long  discussion about weight loss, diet, and exercise  Abnormal glucose Discussed general issues about diabetes pathophysiology and management., Educational material distributed., Suggested low cholesterol diet., Encouraged aerobic exercise., Discussed foot care., Reminded to get yearly retinal exam. -     Hemoglobin A1c  Vitamin D deficiency Continue supplementation Check vitamin D level  History of ulcer disease Monitor, avoid NSAIDS, continue GERD meds  CKD III Increase fluids, avoid NSAIDS, monitor sugars, will monitor   Over 40 minutes of exam, counseling, chart review and critical decision making was performed Future Appointments  Date Time Provider [place]  09/26/2019  3:00 PM Liane Comber, NP [facility] None  01/20/2020  2:00 PM Vicie Mutters, PA-C [facility] None     Subjective:  [name] [name] is a 73 y.o. female who presents for CPE and 3 month follow up.   She complains of some loose stools, having AB discomfort periumbilical and RUQ. No GERD, nausea, decreased appetite. She take iron so occ has black stool. S/p choley, has history of pancreatitis. Will send hemoccult card as well.    She has hx of a. Fib, on elequis, followed by cardiology Dr. Ellyn Hack.   She has hx of R breast cancer in 2015, s/p lumpectomy, on anastrozole, followed by Dr. Jana Hakim.   She follows with Dr. Verl Blalock (pain management) for cervical and lower back pain with radiculopathy and gets epidural injections. She also has knee pain seen by Dr. [name]. May have surgery.   she has a diagnosis of depression/anxiety and is currently on wellbutrin 150 mg, cymbalta 90 mg, reports symptoms are well controlled on current regimen. she also takes temazepam PRN insomnia daily.  BMI is Body mass index is 39.22 kg/m., she has not been working  on diet and exercise. Wt Readings from Last 3 Encounters:  01/17/19 243 lb (110.2 kg)  10/29/18 237 lb (107.5 kg)  09/24/18 236 lb (107 kg)    Her blood  pressure has been controlled at home, today their BP is BP: 120/74 She does not workout. She denies chest pain, shortness of breath, dizziness.   She is on cholesterol medication (rosuvastatin 40 mg daily) and denies myalgias. Her cholesterol is at goal. The cholesterol last visit was:   Lab Results  Component Value Date   CHOL 136 09/24/2018   HDL 51 09/24/2018   LDLCALC 67 09/24/2018   TRIG 97 09/24/2018   CHOLHDL 2.7 09/24/2018    She has not been working on diet and exercise for prediabetes, and denies foot ulcerations, increased appetite, nausea, paresthesia of the feet, polydipsia, polyuria, visual disturbances, vomiting and weight loss. Last A1C in the office was:  Lab Results  Component Value Date   HGBA1C 6.1 (H) 09/24/2018   Last GFR: Lab Results  Component Value Date   GFRNONAA 59 (L) 10/04/2018   Patient is on Vitamin D supplement.   Lab Results  Component Value Date   VD25OH 82 04/23/2018       Medication Review:  Current Outpatient Medications (Endocrine & Metabolic):  .  levothyroxine (SYNTHROID) 50 MCG tablet, Take 1/2 to 1 tablet daily as directed on an empty stomach with only water for 30 minutes & no Antacid meds, Calcium or Magnesium for 4 hours & avoid Biotin  Current Outpatient Medications (Cardiovascular):  .  carvedilol (COREG) 25 MG tablet, Take 1 tablet 2 x /day for BP & Heart .  furosemide (LASIX) 20 MG tablet, Take 1 tablet 2 x /day for BP & Fluid .  losartan (COZAAR) 100 MG tablet, TAKE 1 TABLET BY MOUTH EVERY DAY REPLACES VALSARTAN .  rosuvastatin (CRESTOR) 40 MG tablet, TAKE 1 TABLET EVERY DAY, EXCEPT TAKE 2 TABLETS ON MONDAY  Current Outpatient Medications (Respiratory):  .  mometasone (NASONEX) 50 MCG/ACT nasal spray, USE 1 TO 2 SPRAYS IN EACH NOSTRIL TWICE DAILY .  ipratropium (ATROVENT) 0.03 % nasal spray, Place 2 sprays into the nose 3 (three) times daily.   Current Outpatient Medications (Hematological):  Marland Kitchen  ELIQUIS 5 MG TABS tablet,  TAKE 1 TABLET BY MOUTH TWICE A DAY .  ferrous sulfate 325 (65 FE) MG tablet, Take 1 tablet 2 x /day with Meals for Low Iron  Current Outpatient Medications (Other):  Marland Kitchen  buPROPion (WELLBUTRIN XL) 150 MG 24 hr tablet, TAKE 1 TABLET BY MOUTH EVERY DAY IN THE MORNING .  chlorhexidine (PERIDEX) 0.12 % solution, SWISH 1 CAPFUL FOR 30 SECONDS TWICE A DAY *SPIT OUT AFTER USE* *START 1 WEEK PRIOR TO SURGERY* .  Cholecalciferol (VITAMIN D) 2000 UNITS tablet, Take 4,000 Units by mouth daily. .  Coenzyme Q10 (COQ10) 100 MG CAPS, Take 300 mg by mouth daily. Marland Kitchen  docusate sodium (COLACE) 100 MG capsule, Take 1 capsule (100 mg total) by mouth 2 (two) times daily. .  DULoxetine (CYMBALTA) 30 MG capsule, TAKE ONE CAPSULE 3 TIMES A DAY .  gabapentin (NEURONTIN) 300 MG capsule, Take 1 capsule (300 mg total) by mouth 2 (two) times daily. Marland Kitchen  ketoconazole (NIZORAL) 2 % shampoo, Apply 1 application topically 2 (two) times a week. .  nystatin ointment (MYCOSTATIN), Apply 1 application topically 2 (two) times daily. Marland Kitchen  omeprazole (PRILOSEC) 40 MG capsule, Take 1 capsule twice daily for Acid Indigestion & Reflux. .  temazepam (RESTORIL)  30 MG capsule, TAKE 1 CAPSULE BY MOUTH AT BEDTIME ONLY IF NEEDED .  triamcinolone ointment (KENALOG) 0.1 %, Apply 1 application topically 2 (two) times daily.   Allergies  Allergen Reactions  . Requip [Ropinirole Hcl] Shortness Of Breath and Nausea And Vomiting  . Minocycline Hives  . Tetracyclines & Related Hives  . Zestril [Lisinopril] Cough  . Zetia [Ezetimibe] Other (See Comments)    MYALGIA JOINT PAIN  . Amoxicillin Rash  . Codeine Other (See Comments)    fatigue  . Morphine Sulfate Other (See Comments)    Palpitations  . Sulfa Antibiotics Other (See Comments)    Headache (pt states that a blood pressure medicine caused a severe headache but doesn't remember a reaction to sulfa)    Current Problems (verified) Patient Active Problem List   Diagnosis Date Noted  . CKD  (chronic kidney disease) stage 3, GFR 30-59 ml/min 09/21/2018  . Morbid obesity ([place]) 06/14/2017  . Primary osteoarthritis of left knee 09/23/2016  . Non-ischemic cardiomyopathy ([place])   . Chronic combined systolic and diastolic heart failure, NYHA class 2 ([place]) -- exacerbated by A. fib 08/08/2015  . Paroxysmal atrial fibrillation [facility]): CHA2DS2-VASc Score - starting Eliquis 08/06/2015  . Hypothyroidism 08/06/2015  . Absolute anemia   . Encounter for Medicare annual wellness exam 03/31/2015  . DDD (degenerative disc disease), lumbar 06/16/2014  . Other abnormal glucose (prediabetes) 12/18/2013  . Vitamin D deficiency 12/18/2013  . Medication management 12/18/2013  . Paroxysmal supraventricular tachycardia ([place]) 10/18/2013  . Radiculopathy 10/16/2013  . Malignant neoplasm of lower-inner quadrant of left breast in female, estrogen receptor positive ([place]) 05/20/2013  . Hyperlipidemia 03/13/2008  . Depression, major, recurrent, in partial remission ([place]) 03/13/2008  . Essential hypertension 03/13/2008  . H/O left bundle branch block 03/13/2008  . Allergic rhinitis 03/13/2008  . GERD 03/13/2008    Screening Tests Immunization History  Administered Date(s) Administered  . Influenza Whole 01/01/2013  . Influenza, High Dose Seasonal PF 01/29/2014, 12/30/2014, 01/17/2019  . Influenza,inj,Quad PF,6+ Mos 01/18/2018  . Influenza-Unspecified 12/11/2015, 02/04/2017, 01/18/2018  . Pneumococcal Conjugate-13 06/16/2014  . Pneumococcal Polysaccharide-23 05/23/2011  . Td 09/24/2018  . Tdap 03/11/2008  . Zoster 02/02/2010   Preventative care: Last colonoscopy: 2014 Last mammogram: 05/2018 (05/06/2013 + right breast cancer s/p lumpectomy) Last pap smear/pelvic exam: remote   DEXA: 05/2018 - L forearm T -1.4, otherwise in normal range Renal US 08/2015 Echo 2017 Cath 2017  Prior vaccinations: TD or Tdap: 2020 Influenza: 2020 Pneumococcal: 2013 Prevnar 13: 2016 Shingles/Zostavax:  2011  Names of Other Physician/Practitioners you currently use: 1. [facility] here for primary care 2. Dr Nicki Reaper, eye doctor, last visit July 2020 3. [facility], dentist, last visit 2020, q 6 months  Patient Care Team: Unk Pinto, MD as PCP - General (Internal Medicine) Leonie Man, MD as PCP - Cardiology (Cardiology) Magrinat, Virgie Dad, MD as Consulting Physician (Oncology) Leonie Man, MD as Consulting Physician (Cardiology) Dorna Leitz, MD as Consulting Physician (Orthopedic Surgery) Rolm Bookbinder, MD as Consulting Physician (General Surgery) Eppie Gibson, MD as Attending Physician (Radiation Oncology)  SURGICAL HISTORY She  has a past surgical history that includes Cataract extraction bilateral w/ anterior vitrectomy (Bilateral); Abdominal hysterectomy; Cholecystectomy; Knee arthroscopy (Bilateral); Tonsillectomy; Colonoscopy (2014); Upper gi endoscopy; Dupuytren / palmar fasciotomy (Bilateral, 2007); Tubal ligation; epidural injections; Lumbar fusion (10/2013); Cardiac catheterization (N/A, 08/10/2015); transthoracic echocardiogram (08/07/2015); transthoracic echocardiogram (10/2015); Joint replacement; Total knee arthroplasty (Left, 09/23/2016); Breast lumpectomy (Left); Foot  Tendon Surgery (Right, ~ 11/2015); and Total knee arthroplasty (Left, 09/23/2016). FAMILY HISTORY Her family history includes COPD in her father; Heart attack in her mother; Hypertension in her father; Stroke in her mother. SOCIAL HISTORY She  reports that she has never smoked. She has never used smokeless tobacco. She reports current alcohol use. She reports that she does not use drugs.   Review of Systems  Constitutional: Negative for malaise/fatigue and weight loss.  HENT: Negative for hearing loss and tinnitus.   Eyes: Negative for blurred vision and double vision.  Respiratory: Negative for cough, sputum production, shortness of  breath and wheezing.   Cardiovascular: Negative for chest pain, palpitations, orthopnea, claudication, leg swelling and PND.  Gastrointestinal: Negative for abdominal pain, blood in stool, constipation, diarrhea, heartburn, melena, nausea and vomiting.  Genitourinary: Negative.   Musculoskeletal: Negative for falls, joint pain and myalgias.  Skin: Negative for rash.  Neurological: Negative for dizziness, tingling, sensory change, weakness and headaches.  Endo/Heme/Allergies: Negative for polydipsia.  Psychiatric/Behavioral: Negative.  Negative for depression, memory loss, substance abuse and suicidal ideas. The patient is not nervous/anxious and does not have insomnia.   All other systems reviewed and are negative.    Objective:     Today's Vitals   01/17/19 1353  BP: 120/74  Pulse: 68  Temp: (!) 97.3 F (36.3 C)  SpO2: 97%  Weight: 243 lb (110.2 kg)  Height: 5\' 6"  (1.676 m)   Body mass index is 39.22 kg/m.  General appearance: alert, no distress, WD/WN, female HEENT: normocephalic, sclerae anicteric, TMs pearly, nares patent, no discharge or erythema, pharynx normal Oral cavity: MMM, no lesions Neck: supple, no lymphadenopathy, no thyromegaly, no masses Heart: RRR, normal S1, S2, no murmurs Lungs: CTA bilaterally, no wheezes, rhonchi, or rales Abdomen: +bs, soft, non tender, non distended, no masses, no hepatomegaly, no splenomegaly Musculoskeletal: nontender, no swelling, no obvious deformity Extremities: no edema, no cyanosis, no clubbing Pulses: 2+ symmetric, upper and lower extremities, normal cap refill Neurological: alert, oriented x 3, CN2-12 intact, strength normal upper extremities and lower extremities, sensation normal throughout, DTRs 2+ throughout, no cerebellar signs, gait normal Psychiatric: normal affect, behavior normal, pleasant     Vicie Mutters, PA-C   01/17/2019

## 2019-01-17 ENCOUNTER — Ambulatory Visit: Payer: Self-pay | Admitting: Physician Assistant

## 2019-01-17 ENCOUNTER — Ambulatory Visit: Payer: Medicare Other | Admitting: Physician Assistant

## 2019-01-17 ENCOUNTER — Encounter: Payer: Self-pay | Admitting: Physician Assistant

## 2019-01-17 ENCOUNTER — Other Ambulatory Visit: Payer: Self-pay

## 2019-01-17 VITALS — BP 120/74 | HR 68 | Temp 97.3°F | Ht 66.0 in | Wt 243.0 lb

## 2019-01-17 DIAGNOSIS — E782 Mixed hyperlipidemia: Secondary | ICD-10-CM

## 2019-01-17 DIAGNOSIS — E611 Iron deficiency: Secondary | ICD-10-CM

## 2019-01-17 DIAGNOSIS — E039 Hypothyroidism, unspecified: Secondary | ICD-10-CM

## 2019-01-17 DIAGNOSIS — Z79899 Other long term (current) drug therapy: Secondary | ICD-10-CM

## 2019-01-17 DIAGNOSIS — Z0000 Encounter for general adult medical examination without abnormal findings: Secondary | ICD-10-CM

## 2019-01-17 DIAGNOSIS — C50312 Malignant neoplasm of lower-inner quadrant of left female breast: Secondary | ICD-10-CM

## 2019-01-17 DIAGNOSIS — Z23 Encounter for immunization: Secondary | ICD-10-CM | POA: Diagnosis not present

## 2019-01-17 DIAGNOSIS — I10 Essential (primary) hypertension: Secondary | ICD-10-CM

## 2019-01-17 DIAGNOSIS — D649 Anemia, unspecified: Secondary | ICD-10-CM

## 2019-01-17 DIAGNOSIS — E538 Deficiency of other specified B group vitamins: Secondary | ICD-10-CM

## 2019-01-17 DIAGNOSIS — I471 Supraventricular tachycardia, unspecified: Secondary | ICD-10-CM

## 2019-01-17 DIAGNOSIS — I428 Other cardiomyopathies: Secondary | ICD-10-CM

## 2019-01-17 DIAGNOSIS — I480 Paroxysmal atrial fibrillation: Secondary | ICD-10-CM

## 2019-01-17 DIAGNOSIS — R1013 Epigastric pain: Secondary | ICD-10-CM

## 2019-01-17 DIAGNOSIS — N183 Chronic kidney disease, stage 3 unspecified: Secondary | ICD-10-CM

## 2019-01-17 DIAGNOSIS — F3341 Major depressive disorder, recurrent, in partial remission: Secondary | ICD-10-CM

## 2019-01-17 DIAGNOSIS — M5136 Other intervertebral disc degeneration, lumbar region: Secondary | ICD-10-CM

## 2019-01-17 DIAGNOSIS — I5042 Chronic combined systolic (congestive) and diastolic (congestive) heart failure: Secondary | ICD-10-CM

## 2019-01-17 DIAGNOSIS — E559 Vitamin D deficiency, unspecified: Secondary | ICD-10-CM

## 2019-01-17 DIAGNOSIS — M51369 Other intervertebral disc degeneration, lumbar region without mention of lumbar back pain or lower extremity pain: Secondary | ICD-10-CM

## 2019-01-17 DIAGNOSIS — Z0001 Encounter for general adult medical examination with abnormal findings: Secondary | ICD-10-CM

## 2019-01-17 DIAGNOSIS — R7309 Other abnormal glucose: Secondary | ICD-10-CM

## 2019-01-17 DIAGNOSIS — Z170 Estrogen receptor positive status [ER+]: Secondary | ICD-10-CM

## 2019-01-17 DIAGNOSIS — K219 Gastro-esophageal reflux disease without esophagitis: Secondary | ICD-10-CM

## 2019-01-17 NOTE — Patient Instructions (Signed)
General eating tips  What to Avoid . Avoid added sugars o Often added sugar can be found in processed foods such as many condiments, dry cereals, cakes, cookies, chips, crisps, crackers, candies, sweetened drinks, etc.  o Read labels and AVOID/DECREASE use of foods with the following in their ingredient list: Sugar, fructose, high fructose corn syrup, sucrose, glucose, maltose, dextrose, molasses, cane sugar, brown sugar, any type of syrup, agave nectar, etc.   . Avoid snacking in between meals- drink water or if you feel you need a snack, pick a high water content snack such as cucumbers, watermelon, or any veggie.  Marland Kitchen Avoid foods made with flour o If you are going to eat food made with flour, choose those made with whole-grains; and, minimize your consumption as much as is tolerable . Avoid processed foods o These foods are generally stocked in the middle of the grocery store.  o Focus on shopping on the perimeter of the grocery.  What to Include . Vegetables o GREEN LEAFY VEGETABLES: Kale, spinach, mustard greens, collard greens, cabbage, broccoli, etc. o OTHER: Asparagus, cauliflower, eggplant, carrots, peas, Brussel sprouts, tomatoes, bell peppers, zucchini, beets, cucumbers, etc. . Grains, seeds, and legumes o Beans: kidney beans, black eyed peas, garbanzo beans, black beans, pinto beans, etc. o Whole, unrefined grains: brown rice, barley, bulgur, oatmeal, etc. . Healthy fats  o Avoid highly processed fats such as vegetable oil o Examples of healthy fats: avocado, olives, virgin olive oil, dark chocolate (?72% Cocoa), nuts (peanuts, almonds, walnuts, cashews, pecans, etc.) o Please still do small amount of these healthy fats, they are dense in calories.  . Low - Moderate Intake of Animal Sources of Protein o Meat sources: chicken, [place], salmon, tuna. Limit to 4 ounces of meat at one time or the size of your palm. o Consider limiting dairy sources, but when choosing dairy focus on:  PLAIN [place] yogurt, cottage cheese, high-protein milk . Fruit o Choose berries    Acute Pancreatitis  The pancreas is a gland that is located behind the stomach on the left side of the abdomen. It produces enzymes that help to digest food. The pancreas also releases the hormones glucagon and insulin, which help to regulate blood sugar. Acute pancreatitis happens when inflammation of the pancreas suddenly occurs and the pancreas becomes irritated and swollen. Most acute attacks last a few days and cause serious problems. Some people become dehydrated and develop low blood pressure. In severe cases, bleeding in the abdomen can lead to shock and can be life-threatening. The lungs, heart, and kidneys may fail. What are the causes? This condition may be caused by:  Alcohol abuse.  Drug abuse.  Gallstones or other conditions that can block the tube that drains the pancreas (pancreatic duct).  A tumor in the pancreas. Other causes include:  Certain medicines.  Exposure to certain chemicals.  Diabetes.  An infection in the pancreas.  Damage caused by an accident (trauma).  The poison (venom) from a scorpion bite.  Abdominal surgery.  Autoimmune pancreatitis. This is when the body's disease-fighting (immune) system attacks the pancreas.  Genes that are passed from parent to child (inherited). In some cases, the cause of this condition is not known. What are the signs or symptoms? Symptoms of this condition include:  Pain in the upper abdomen that may radiate to the back. Pain may be severe.  Tenderness and swelling of the abdomen.  Nausea and vomiting.  Fever. How is this diagnosed? This condition may be diagnosed  based on:  A physical exam.  Blood tests.  Imaging tests, such as X-rays, CT or MRI scans, or an ultrasound of the abdomen. How is this treated? Treatment for this condition usually requires a stay in the hospital. Treatment for this condition may include:   Pain medicine.  Fluid replacement through an IV.  Placing a tube in the stomach to remove stomach contents and to control vomiting (NG tube, or nasogastric tube).  Not eating for 3-4 days. This gives the pancreas a rest, because enzymes are not being produced that can cause further damage.  Antibiotic medicines, if your condition is caused by an infection.  Treating any underlying conditions that may be the cause.  Steroid medicines, if your condition is caused by your immune system attacking your body's own tissues (autoimmune disease).  Surgery on the pancreas or gallbladder. Follow these instructions at home: Eating and drinking   Follow instructions from your health care provider about diet. This may involve avoiding alcohol and decreasing the amount of fat in your diet.  Eat smaller, more frequent meals. This reduces the amount of digestive fluids that the pancreas produces.  Drink enough fluid to keep your urine pale yellow.  Do not drink alcohol if it caused your condition. General instructions  Take over-the-counter and prescription medicines only as told by your health care provider.  Do not drive or use heavy machinery while taking prescription pain medicine.  Ask your health care provider if the medicine prescribed to you can cause constipation. You may need to take steps to prevent or treat constipation, such as: ? Take an over-the-counter or prescription medicine for constipation. ? Eat foods that are high in fiber such as whole grains and beans. ? Limit foods that are high in fat and processed sugars, such as fried or sweet foods.  Do not use any products that contain nicotine or tobacco, such as cigarettes, e-cigarettes, and chewing tobacco. If you need help quitting, ask your health care provider.  Get plenty of rest.  If directed, check your blood sugar at home as told by your health care provider.  Keep all follow-up visits as told by your health care  provider. This is important. Contact a health care provider if you:  Do not recover as quickly as expected.  Develop new or worsening symptoms.  Have persistent pain, weakness, or nausea.  Recover and then have another episode of pain.  Have a fever. Get help right away if:  You cannot eat or keep fluids down.  Your pain becomes severe.  Your skin or the white part of your eyes turns yellow (jaundice).  You have sudden swelling in your abdomen.  You vomit.  You feel dizzy or you faint.  Your blood sugar is high (over 300 mg/dL). Summary  Acute pancreatitis happens when inflammation of the pancreas suddenly occurs and the pancreas becomes irritated and swollen.  This condition is typically caused by alcohol abuse, drug abuse, or gallstones.  Treatment for this condition usually requires a stay in the hospital. This information is not intended to replace advice given to you by your health care provider. Make sure you discuss any questions you have with your health care provider. Document Released: 03/28/2005 Document Revised: 01/15/2018 Document Reviewed: 10/02/2017 Elsevier Patient Education  2020 [employer].

## 2019-01-18 LAB — COMPLETE METABOLIC PANEL WITH GFR
AG Ratio: 1.5 (calc) (ref 1.0–2.5)
ALT: 10 U/L (ref 6–29)
AST: 14 U/L (ref 10–35)
Albumin: 3.7 g/dL (ref 3.6–5.1)
Alkaline phosphatase (APISO): 79 U/L (ref 37–153)
BUN: 17 mg/dL (ref 7–25)
CO2: 27 mmol/L (ref 20–32)
Calcium: 8.9 mg/dL (ref 8.6–10.4)
Chloride: 105 mmol/L (ref 98–110)
Creat: 0.93 mg/dL (ref 0.60–0.93)
GFR, Est African American: 71 mL/min/{1.73_m2} (ref >=60)
GFR, Est Non African American: 61 mL/min/{1.73_m2} (ref >=60)
Globulin: 2.4 g/dL (calc) (ref 1.9–3.7)
Glucose, Bld: 94 mg/dL (ref 65–99)
Potassium: 3.6 mmol/L (ref 3.5–5.3)
Sodium: 142 mmol/L (ref 135–146)
Total Bilirubin: 0.3 mg/dL (ref 0.2–1.2)
Total Protein: 6.1 g/dL (ref 6.1–8.1)

## 2019-01-18 LAB — CBC WITH DIFFERENTIAL/PLATELET
Absolute Monocytes: 640 cells/uL (ref 200–950)
Basophils Absolute: 87 cells/uL (ref 0–200)
Basophils Relative: 1.1 %
Eosinophils Absolute: 182 cells/uL (ref 15–500)
Eosinophils Relative: 2.3 %
HCT: 30.3 % — ABNORMAL LOW (ref 35.0–45.0)
Hemoglobin: 9.7 g/dL — ABNORMAL LOW (ref 11.7–15.5)
Lymphs Abs: 1525 cells/uL (ref 850–3900)
MCH: 28.7 pg (ref 27.0–33.0)
MCHC: 32 g/dL (ref 32.0–36.0)
MCV: 89.6 fL (ref 80.0–100.0)
MPV: 11.3 fL (ref 7.5–12.5)
Monocytes Relative: 8.1 %
Neutro Abs: 5467 cells/uL (ref 1500–7800)
Neutrophils Relative %: 69.2 %
Platelets: 291 10*3/uL (ref 140–400)
RBC: 3.38 10*6/uL — ABNORMAL LOW (ref 3.80–5.10)
RDW: 14 % (ref 11.0–15.0)
Total Lymphocyte: 19.3 %
WBC: 7.9 10*3/uL (ref 3.8–10.8)

## 2019-01-18 LAB — MICROALBUMIN / CREATININE URINE RATIO
Creatinine, Urine: 43 mg/dL (ref 20–275)
Microalb Creat Ratio: 14 mcg/mg creat (ref <30)
Microalb, Ur: 0.6 mg/dL

## 2019-01-18 LAB — LIPID PANEL
Cholesterol: 123 mg/dL (ref <200)
HDL: 49 mg/dL — ABNORMAL LOW (ref >=50)
LDL Cholesterol (Calc): 52 mg/dL (calc)
Non-HDL Cholesterol (Calc): 74 mg/dL (calc) (ref <130)
Total CHOL/HDL Ratio: 2.5 (calc) (ref <5.0)
Triglycerides: 136 mg/dL (ref <150)

## 2019-01-18 LAB — TSH: TSH: 1.06 mIU/L (ref 0.40–4.50)

## 2019-01-18 LAB — AMYLASE: Amylase: 24 U/L (ref 21–101)

## 2019-01-18 LAB — HEMOGLOBIN A1C
Hgb A1c MFr Bld: 6.2 % of total Hgb — ABNORMAL HIGH (ref <5.7)
Mean Plasma Glucose: 131 (calc)
eAG (mmol/L): 7.3 (calc)

## 2019-01-18 LAB — IRON, TOTAL/TOTAL IRON BINDING CAP
%SAT: 14 % (calc) — ABNORMAL LOW (ref 16–45)
Iron: 49 ug/dL (ref 45–160)
TIBC: 342 mcg/dL (calc) (ref 250–450)

## 2019-01-18 LAB — URINALYSIS, ROUTINE W REFLEX MICROSCOPIC
Bilirubin Urine: NEGATIVE
Glucose, UA: NEGATIVE
Hgb urine dipstick: NEGATIVE
Ketones, ur: NEGATIVE
Leukocytes,Ua: NEGATIVE
Nitrite: NEGATIVE
Protein, ur: NEGATIVE
Specific Gravity, Urine: 1.008 (ref 1.001–1.03)
pH: 5.5 (ref 5.0–8.0)

## 2019-01-18 LAB — VITAMIN D 25 HYDROXY (VIT D DEFICIENCY, FRACTURES): Vit D, 25-Hydroxy: 59 ng/mL (ref 30–100)

## 2019-01-18 LAB — VITAMIN B12: Vitamin B-12: 313 pg/mL (ref 200–1100)

## 2019-01-18 LAB — MAGNESIUM: Magnesium: 2 mg/dL (ref 1.5–2.5)

## 2019-01-18 LAB — LIPASE: Lipase: 47 U/L (ref 7–60)

## 2019-01-18 LAB — FERRITIN: Ferritin: 26 ng/mL (ref 16–288)

## 2019-01-29 ENCOUNTER — Other Ambulatory Visit: Payer: Self-pay | Admitting: Cardiology

## 2019-01-29 NOTE — Telephone Encounter (Signed)
72 F 110.2 kg, SCr 0.93 (01/2019), LOV 10/2018 [facility]

## 2019-02-11 ENCOUNTER — Other Ambulatory Visit: Payer: Self-pay | Admitting: Cardiology

## 2019-03-12 DIAGNOSIS — I5032 Chronic diastolic (congestive) heart failure: Secondary | ICD-10-CM

## 2019-03-12 HISTORY — DX: Chronic diastolic (congestive) heart failure: I50.32

## 2019-03-20 ENCOUNTER — Other Ambulatory Visit: Payer: Self-pay | Admitting: Physician Assistant

## 2019-03-30 ENCOUNTER — Other Ambulatory Visit: Payer: Self-pay | Admitting: Physician Assistant

## 2019-04-04 ENCOUNTER — Emergency Department (HOSPITAL_COMMUNITY): Payer: Medicare Other

## 2019-04-04 ENCOUNTER — Inpatient Hospital Stay (HOSPITAL_COMMUNITY)
Admission: EM | Admit: 2019-04-04 | Discharge: 2019-04-10 | DRG: 177 | Disposition: A | Discharge status: Home Health | Payer: Medicare Other | Attending: Family Medicine | Admitting: Family Medicine

## 2019-04-04 ENCOUNTER — Other Ambulatory Visit: Payer: Self-pay

## 2019-04-04 ENCOUNTER — Encounter (HOSPITAL_COMMUNITY): Payer: Self-pay | Admitting: Emergency Medicine

## 2019-04-04 DIAGNOSIS — Z881 Allergy status to other antibiotic agents status: Secondary | ICD-10-CM

## 2019-04-04 DIAGNOSIS — Z888 Allergy status to other drugs, medicaments and biological substances status: Secondary | ICD-10-CM

## 2019-04-04 DIAGNOSIS — E782 Mixed hyperlipidemia: Secondary | ICD-10-CM | POA: Diagnosis present

## 2019-04-04 DIAGNOSIS — Z923 Personal history of irradiation: Secondary | ICD-10-CM

## 2019-04-04 DIAGNOSIS — I447 Left bundle-branch block, unspecified: Secondary | ICD-10-CM | POA: Diagnosis present

## 2019-04-04 DIAGNOSIS — J9601 Acute respiratory failure with hypoxia: Secondary | ICD-10-CM | POA: Diagnosis present

## 2019-04-04 DIAGNOSIS — Z6839 Body mass index (BMI) 39.0-39.9, adult: Secondary | ICD-10-CM

## 2019-04-04 DIAGNOSIS — R008 Other abnormalities of heart beat: Secondary | ICD-10-CM | POA: Diagnosis present

## 2019-04-04 DIAGNOSIS — I480 Paroxysmal atrial fibrillation: Secondary | ICD-10-CM | POA: Diagnosis present

## 2019-04-04 DIAGNOSIS — I130 Hypertensive heart and chronic kidney disease with heart failure and stage 1 through stage 4 chronic kidney disease, or unspecified chronic kidney disease: Secondary | ICD-10-CM | POA: Diagnosis present

## 2019-04-04 DIAGNOSIS — Z96652 Presence of left artificial knee joint: Secondary | ICD-10-CM | POA: Diagnosis present

## 2019-04-04 DIAGNOSIS — R001 Bradycardia, unspecified: Secondary | ICD-10-CM | POA: Diagnosis present

## 2019-04-04 DIAGNOSIS — G8929 Other chronic pain: Secondary | ICD-10-CM | POA: Diagnosis present

## 2019-04-04 DIAGNOSIS — Z882 Allergy status to sulfonamides status: Secondary | ICD-10-CM

## 2019-04-04 DIAGNOSIS — U071 COVID-19: Secondary | ICD-10-CM | POA: Diagnosis present

## 2019-04-04 DIAGNOSIS — F329 Major depressive disorder, single episode, unspecified: Secondary | ICD-10-CM | POA: Diagnosis present

## 2019-04-04 DIAGNOSIS — N183 Chronic kidney disease, stage 3 unspecified: Secondary | ICD-10-CM | POA: Diagnosis present

## 2019-04-04 DIAGNOSIS — Z79899 Other long term (current) drug therapy: Secondary | ICD-10-CM

## 2019-04-04 DIAGNOSIS — I10 Essential (primary) hypertension: Secondary | ICD-10-CM | POA: Diagnosis present

## 2019-04-04 DIAGNOSIS — I5032 Chronic diastolic (congestive) heart failure: Secondary | ICD-10-CM | POA: Diagnosis present

## 2019-04-04 DIAGNOSIS — E860 Dehydration: Secondary | ICD-10-CM | POA: Diagnosis present

## 2019-04-04 DIAGNOSIS — F419 Anxiety disorder, unspecified: Secondary | ICD-10-CM | POA: Diagnosis present

## 2019-04-04 DIAGNOSIS — R7303 Prediabetes: Secondary | ICD-10-CM | POA: Diagnosis present

## 2019-04-04 DIAGNOSIS — I5030 Unspecified diastolic (congestive) heart failure: Secondary | ICD-10-CM | POA: Diagnosis present

## 2019-04-04 DIAGNOSIS — K219 Gastro-esophageal reflux disease without esophagitis: Secondary | ICD-10-CM | POA: Diagnosis present

## 2019-04-04 DIAGNOSIS — I428 Other cardiomyopathies: Secondary | ICD-10-CM | POA: Diagnosis present

## 2019-04-04 DIAGNOSIS — Z8249 Family history of ischemic heart disease and other diseases of the circulatory system: Secondary | ICD-10-CM

## 2019-04-04 DIAGNOSIS — I498 Other specified cardiac arrhythmias: Secondary | ICD-10-CM

## 2019-04-04 DIAGNOSIS — I499 Cardiac arrhythmia, unspecified: Secondary | ICD-10-CM | POA: Diagnosis not present

## 2019-04-04 DIAGNOSIS — Z853 Personal history of malignant neoplasm of breast: Secondary | ICD-10-CM

## 2019-04-04 DIAGNOSIS — I5042 Chronic combined systolic (congestive) and diastolic (congestive) heart failure: Secondary | ICD-10-CM | POA: Diagnosis present

## 2019-04-04 DIAGNOSIS — E039 Hypothyroidism, unspecified: Secondary | ICD-10-CM | POA: Diagnosis present

## 2019-04-04 DIAGNOSIS — Z981 Arthrodesis status: Secondary | ICD-10-CM

## 2019-04-04 DIAGNOSIS — Z7901 Long term (current) use of anticoagulants: Secondary | ICD-10-CM

## 2019-04-04 DIAGNOSIS — N179 Acute kidney failure, unspecified: Secondary | ICD-10-CM | POA: Diagnosis present

## 2019-04-04 DIAGNOSIS — G4700 Insomnia, unspecified: Secondary | ICD-10-CM | POA: Diagnosis present

## 2019-04-04 DIAGNOSIS — E785 Hyperlipidemia, unspecified: Secondary | ICD-10-CM | POA: Diagnosis present

## 2019-04-04 DIAGNOSIS — E876 Hypokalemia: Secondary | ICD-10-CM | POA: Diagnosis present

## 2019-04-04 DIAGNOSIS — J1289 Other viral pneumonia: Secondary | ICD-10-CM | POA: Diagnosis present

## 2019-04-04 DIAGNOSIS — Z79890 Hormone replacement therapy (postmenopausal): Secondary | ICD-10-CM

## 2019-04-04 DIAGNOSIS — I313 Pericardial effusion (noninflammatory): Secondary | ICD-10-CM | POA: Diagnosis present

## 2019-04-04 DIAGNOSIS — Z885 Allergy status to narcotic agent status: Secondary | ICD-10-CM

## 2019-04-04 DIAGNOSIS — Z8616 Personal history of COVID-19: Secondary | ICD-10-CM | POA: Diagnosis present

## 2019-04-04 HISTORY — DX: COVID-19: U07.1

## 2019-04-04 LAB — COMPREHENSIVE METABOLIC PANEL
ALT: 18 U/L (ref 0–44)
AST: 22 U/L (ref 15–41)
Albumin: 3.4 g/dL — ABNORMAL LOW (ref 3.5–5.0)
Alkaline Phosphatase: 78 U/L (ref 38–126)
Anion gap: 11 (ref 5–15)
BUN: 20 mg/dL (ref 8–23)
CO2: 24 mmol/L (ref 22–32)
Calcium: 8.2 mg/dL — ABNORMAL LOW (ref 8.9–10.3)
Chloride: 103 mmol/L (ref 98–111)
Creatinine, Ser: 2.17 mg/dL — ABNORMAL HIGH (ref 0.44–1.00)
GFR calc Af Amer: 25 mL/min — ABNORMAL LOW (ref >60)
GFR calc non Af Amer: 22 mL/min — ABNORMAL LOW (ref >60)
Glucose, Bld: 83 mg/dL (ref 70–99)
Potassium: 3.4 mmol/L — ABNORMAL LOW (ref 3.5–5.1)
Sodium: 138 mmol/L (ref 135–145)
Total Bilirubin: 0.5 mg/dL (ref 0.3–1.2)
Total Protein: 6.1 g/dL — ABNORMAL LOW (ref 6.5–8.1)

## 2019-04-04 LAB — CBC WITH DIFFERENTIAL/PLATELET
Abs Immature Granulocytes: 0.03 10*3/uL (ref 0.00–0.07)
Abs Immature Granulocytes: 0.02 10*3/uL (ref 0.00–0.07)
Basophils Absolute: 0 10*3/uL (ref 0.0–0.1)
Basophils Absolute: 0 10*3/uL (ref 0.0–0.1)
Basophils Relative: 1 %
Basophils Relative: 0 %
Eosinophils Absolute: 0 10*3/uL (ref 0.0–0.5)
Eosinophils Absolute: 0.1 10*3/uL (ref 0.0–0.5)
Eosinophils Relative: 0 %
Eosinophils Relative: 2 %
HCT: 36.4 % (ref 36.0–46.0)
HCT: 34.6 % — ABNORMAL LOW (ref 36.0–46.0)
Hemoglobin: 11.6 g/dL — ABNORMAL LOW (ref 12.0–15.0)
Hemoglobin: 10.9 g/dL — ABNORMAL LOW (ref 12.0–15.0)
Immature Granulocytes: 1 %
Immature Granulocytes: 0 %
Lymphocytes Relative: 9 %
Lymphocytes Relative: 15 %
Lymphs Abs: 0.6 10*3/uL — ABNORMAL LOW (ref 0.7–4.0)
Lymphs Abs: 0.9 10*3/uL (ref 0.7–4.0)
MCH: 28.7 pg (ref 26.0–34.0)
MCH: 28.5 pg (ref 26.0–34.0)
MCHC: 31.9 g/dL (ref 30.0–36.0)
MCHC: 31.5 g/dL (ref 30.0–36.0)
MCV: 90.1 fL (ref 80.0–100.0)
MCV: 90.6 fL (ref 80.0–100.0)
Monocytes Absolute: 0.7 10*3/uL (ref 0.1–1.0)
Monocytes Absolute: 0.7 10*3/uL (ref 0.1–1.0)
Monocytes Relative: 12 %
Monocytes Relative: 13 %
Neutro Abs: 4.9 10*3/uL (ref 1.7–7.7)
Neutro Abs: 4 10*3/uL (ref 1.7–7.7)
Neutrophils Relative %: 77 %
Neutrophils Relative %: 70 %
Platelets: 245 10*3/uL (ref 150–400)
Platelets: 205 10*3/uL (ref 150–400)
RBC: 4.04 MIL/uL (ref 3.87–5.11)
RBC: 3.82 MIL/uL — ABNORMAL LOW (ref 3.87–5.11)
RDW: 13.7 % (ref 11.5–15.5)
RDW: 13.6 % (ref 11.5–15.5)
WBC: 6.2 10*3/uL (ref 4.0–10.5)
WBC: 5.7 10*3/uL (ref 4.0–10.5)
nRBC: 0 % (ref 0.0–0.2)
nRBC: 0 % (ref 0.0–0.2)

## 2019-04-04 LAB — C-REACTIVE PROTEIN: CRP: <0.5 mg/dL (ref <1.0)

## 2019-04-04 LAB — BASIC METABOLIC PANEL
Anion gap: 13 (ref 5–15)
BUN: 18 mg/dL (ref 8–23)
CO2: 23 mmol/L (ref 22–32)
Calcium: 8.5 mg/dL — ABNORMAL LOW (ref 8.9–10.3)
Chloride: 102 mmol/L (ref 98–111)
Creatinine, Ser: 2.19 mg/dL — ABNORMAL HIGH (ref 0.44–1.00)
GFR calc Af Amer: 25 mL/min — ABNORMAL LOW (ref >60)
GFR calc non Af Amer: 22 mL/min — ABNORMAL LOW (ref >60)
Glucose, Bld: 104 mg/dL — ABNORMAL HIGH (ref 70–99)
Potassium: 2.9 mmol/L — ABNORMAL LOW (ref 3.5–5.1)
Sodium: 138 mmol/L (ref 135–145)

## 2019-04-04 LAB — ABO/RH: ABO/RH(D): A POS

## 2019-04-04 LAB — LIPID PANEL
Cholesterol: 119 mg/dL (ref 0–200)
HDL: 38 mg/dL — ABNORMAL LOW (ref >40)
LDL Cholesterol: 48 mg/dL (ref 0–99)
Total CHOL/HDL Ratio: 3.1 RATIO
Triglycerides: 163 mg/dL — ABNORMAL HIGH (ref <150)
VLDL: 33 mg/dL (ref 0–40)

## 2019-04-04 LAB — D-DIMER, QUANTITATIVE: D-Dimer, Quant: 0.31 ug/mL-FEU (ref 0.00–0.50)

## 2019-04-04 LAB — FERRITIN: Ferritin: 160 ng/mL (ref 11–307)

## 2019-04-04 LAB — FIBRINOGEN: Fibrinogen: 542 mg/dL — ABNORMAL HIGH (ref 210–475)

## 2019-04-04 LAB — TSH: TSH: 1.153 u[IU]/mL (ref 0.350–4.500)

## 2019-04-04 LAB — TROPONIN I (HIGH SENSITIVITY)
Troponin I (High Sensitivity): 8 ng/L (ref <18)
Troponin I (High Sensitivity): 9 ng/L (ref <18)

## 2019-04-04 LAB — MAGNESIUM: Magnesium: 2 mg/dL (ref 1.7–2.4)

## 2019-04-04 LAB — PROCALCITONIN: Procalcitonin: <0.1 ng/mL

## 2019-04-04 LAB — HEPATITIS B SURFACE ANTIGEN: Hepatitis B Surface Ag: NONREACTIVE

## 2019-04-04 LAB — POC SARS CORONAVIRUS 2 AG -  ED: SARS Coronavirus 2 Ag: POSITIVE — AB

## 2019-04-04 LAB — LACTATE DEHYDROGENASE: LDH: 248 U/L — ABNORMAL HIGH (ref 98–192)

## 2019-04-04 MED ORDER — ONDANSETRON HCL 4 MG PO TABS
4.0000 mg | ORAL_TABLET | Freq: Four times a day (QID) | ORAL | Status: DC | PRN
  Administered 2019-04-06: 4 mg via ORAL
  Filled 2019-04-04: qty 1

## 2019-04-04 MED ORDER — ONDANSETRON HCL 4 MG/2ML IJ SOLN: 4.0000 mg | Freq: Four times a day (QID) | INTRAMUSCULAR | Status: DC | PRN

## 2019-04-04 MED ORDER — ACETAMINOPHEN 325 MG PO TABS
650.0000 mg | ORAL_TABLET | Freq: Four times a day (QID) | ORAL | Status: DC | PRN
  Administered 2019-04-04 – 2019-04-08 (×7): 650 mg via ORAL
  Filled 2019-04-04 (×7): qty 2

## 2019-04-04 MED ORDER — POTASSIUM CHLORIDE 10 MEQ/100ML IV SOLN
10.0000 meq | Freq: Two times a day (BID) | INTRAVENOUS | Status: DC | PRN
  Administered 2019-04-04: 15:00:00 10 meq via INTRAVENOUS
  Filled 2019-04-04: qty 100

## 2019-04-04 MED ORDER — POTASSIUM CHLORIDE IN NACL 20-0.9 MEQ/L-% IV SOLN
INTRAVENOUS | Status: DC
  Filled 2019-04-04 (×2): qty 1000

## 2019-04-04 NOTE — ED Notes (Signed)
Got patient on the monitor did ekg shown to er doctor patient is resting with call bell in reach and nurse at bedside 

## 2019-04-04 NOTE — ED Notes (Signed)
This tech attempted x2 to get blood.

## 2019-04-04 NOTE — ED Notes (Signed)
Dinner tray ordered.

## 2019-04-04 NOTE — H&P (Signed)
History and Physical    [name] [name] [id number] DOB: 1945/04/19 DOA: 04/04/2019  PCP: Unk Pinto, MD  Patient coming from: Urgent care I have personally briefly reviewed patient's old medical records in [place]  Chief Complaint: COVID +ve & bradycardia  HPI: [name] [name] is a 73 y.o. female with medical history significant of hypertension, hyperlipidemia, prediabetes, CKD stage III, paroxysmal A. fib [name], nonischemic cardiomyopathy with ejection fraction of 45 to 50%, depression/anxiety came from urgent care as she tested positive for COVID-19 this morning and bradycardia.  Patient reports fever, cough, congestion, myalgia, fatigue, weakness, decreased appetite, loose stool since 5 days.  She went to urgent care this morning and tested positive for COVID-19 and her heart rate noted to be in 40s and patient was advised to go to the emergency department for further evaluation and management..  Denies chest pain, shortness of breath, wheezing, palpitation, headache, blurry vision, leg swelling, orthopnea, PND, urinary or sleep issues.  She lives alone and denies smoking, alcohol, illicit drug use.  ED Course: Upon arrival to ED: Patient had a fever of 102.4, heart rate in 50s, maintaining oxygen saturation on room air, initial troponin negative, magnesium: WNL, EKG showed sinus rhythm with left bundle branch block with bigeminy PVC.  Chest x-ray: Negative CMP shows worsening kidney function and hyperkalemia with potassium level of 2.9.  EDP consulted cardiology for further evaluation and management.   Review of Systems: As per HPI otherwise negative.    Past Medical History:  Diagnosis Date  . Anemia   . Anxiety   . Arthritis   . Breast cancer ([place])     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  . Chronic back pain    cyst sitting on L4-5;slipped disc  . Depression    takes Cymbalta daily  . Diverticulosis   . Dysrhythmia   .  GERD (gastroesophageal reflux disease)    takes Omeprazole daily  . Headache(784.0)    rare  . History of gastric ulcer at age 74  . History of pancreatitis 03/27/2008  . History of ulcer disease 08/06/2015  . Hyperlipidemia   . Hypertension    takes Cardura nightly and Verapamil daily  . Hypothyroidism   . Insomnia    takes Restoril nightly  . Nonischemic cardiomyopathy ([place]) 07/2015   Normal coronary arteries by cath. EF was 25-30% by echo. GR2 DD - likely related to LBBB in setting of A. fib  . Obesity (BMI 30-39.9) 06/11/2013  . Paroxysmal atrial fibrillation ([place]): CHA2DS2-VASc Score - starting Eliquis 08/06/2015   This patients CHA2DS2-VASc Score and unadjusted Ischemic Stroke Rate (% per year) is equal to 4.8 % stroke rate/year from a score of 4  Above score calculated as 1 point each if present [CHF, HTN, DM, Vascular=MI/PAD/Aortic Plaque, Age if 65-74, or Female]; 2 points each if present [Age > 75, or Stroke/TIA/TE]  . PONV (postoperative nausea and vomiting)   . S/P radiation therapy 07/18/2013-08/28/2013   1) Left Breast / 50 Gy in 25 fractions/ 2) Left Breast Boost / 10 Gy in 5 fractions  . Shingles     Past Surgical History:  Procedure Laterality Date  . ABDOMINAL HYSTERECTOMY    . BREAST LUMPECTOMY Left    neg  . CARDIAC CATHETERIZATION N/A 08/10/2015   Procedure: Right/Left Heart Cath and Coronary Angiography;  Surgeon: Burnell Blanks, MD;  Location: [facility];  Service: Cardiovascular;: Nonobstructive CAD  . CATARACT EXTRACTION BILATERAL W/ ANTERIOR  VITRECTOMY Bilateral   . CHOLECYSTECTOMY    . COLONOSCOPY  2014  . DUPUYTREN / PALMAR FASCIOTOMY Bilateral 2007   x 3 to left and once to the right   . epidural injections     x 3  . FOOT TENDON SURGERY Right ~ 11/2015   "stepped in a hole and tore 2 tendons"  . JOINT REPLACEMENT    . KNEE ARTHROSCOPY Bilateral   . LUMBAR FUSION  10/2013   Dr. Lynann Bologna  . TONSILLECTOMY    . TOTAL KNEE ARTHROPLASTY Left  09/23/2016  . TOTAL KNEE ARTHROPLASTY Left 09/23/2016   Procedure: TOTAL KNEE ARTHROPLASTY;  Surgeon: Dorna Leitz, MD;  Location: [facility];  Service: Orthopedics;  Laterality: Left;  . TRANSTHORACIC ECHOCARDIOGRAM  08/07/2015   Severely reduced LVEF at 25-30% with diffuse HK. GR 2 DD. Ventricular dyssynergy secondary to LBBB. Small to moderate pericardial effusion but no hemodynamic compromise  . TRANSTHORACIC ECHOCARDIOGRAM  10/2015    (EF up from 25-30%): - Left ventricle: Poor acoustic windows -  difficult to see.    EF estimated at 45-50% with inferoseptal and possible posterior hypokinesis. GR 1 DD. No pericardial effusion.  . TUBAL LIGATION    . UPPER GI ENDOSCOPY       reports that she has never smoked. She has never used smokeless tobacco. She reports current alcohol use. She reports that she does not use drugs.  Allergies  Allergen Reactions  . Requip [Ropinirole Hcl] Shortness Of Breath and Nausea And Vomiting  . Minocycline Hives  . Tetracyclines & Related Hives  . Zestril [Lisinopril] Cough  . Zetia [Ezetimibe] Other (See Comments)    MYALGIA JOINT PAIN  . Amoxicillin Rash  . Codeine Other (See Comments)    fatigue  . Morphine Sulfate Other (See Comments)    Palpitations  . Sulfa Antibiotics Other (See Comments)    Headache (pt states that a blood pressure medicine caused a severe headache but doesn't remember a reaction to sulfa)    Family History  Problem Relation Age of Onset  . Heart attack Mother   . Stroke Mother   . COPD Father   . Hypertension Father   . Hyperlipidemia Sister   . Colon polyps Neg Hx   . Esophageal cancer Neg Hx   . Rectal cancer Neg Hx   . Stomach cancer Neg Hx     Prior to Admission medications   Medication Sig Start Date End Date Taking? Authorizing Provider  buPROPion (WELLBUTRIN XL) 150 MG 24 hr tablet TAKE 1 TABLET BY MOUTH EVERY DAY IN THE MORNING 10/11/18   Vicie Mutters, PA-C  carvedilol (COREG) 25 MG tablet Take 1 tablet 2 x  /day for BP & Heart 10/15/18   Unk Pinto, MD  chlorhexidine (PERIDEX) 0.12 % solution SWISH 1 CAPFUL FOR 30 SECONDS TWICE A DAY *SPIT OUT AFTER USE* *START 1 WEEK PRIOR TO SURGERY* 05/22/18   [provider]  Cholecalciferol (VITAMIN D) 2000 UNITS tablet Take 4,000 Units by mouth daily.    [provider]  Coenzyme Q10 (COQ10) 100 MG CAPS Take 300 mg by mouth daily. 06/07/17   Leonie Man, MD  docusate sodium (COLACE) 100 MG capsule Take 1 capsule (100 mg total) by mouth 2 (two) times daily. 09/23/16   Gary Fleet, PA-C  DULoxetine (CYMBALTA) 30 MG capsule TAKE ONE CAPSULE 3 TIMES A DAY 10/11/18   Liane Comber, NP  ELIQUIS 5 MG TABS tablet TAKE 1 TABLET BY MOUTH TWICE A DAY  01/29/19   Leonie Man, MD  ferrous sulfate 325 (65 FE) MG tablet Take 1 tablet 2 x /day with Meals for Low Iron 01/10/19   Unk Pinto, MD  furosemide (LASIX) 20 MG tablet Take 1 tablet 2 x /day for BP & Fluid 10/15/18   Unk Pinto, MD  gabapentin (NEURONTIN) 300 MG capsule Take 1 capsule (300 mg total) by mouth 2 (two) times daily. 07/17/18   Vicie Mutters, PA-C  ipratropium (ATROVENT) 0.03 % nasal spray Place 2 sprays into the nose 3 (three) times daily. 06/28/16 09/24/18  Unk Pinto, MD  ketoconazole (NIZORAL) 2 % shampoo Apply 1 application topically 2 (two) times a week. 12/13/18   Liane Comber, NP  levothyroxine (SYNTHROID) 50 MCG tablet Take 1/2 to 1 tablet daily as directed on an empty stomach with only water for 30 minutes & no Antacid meds, Calcium or Magnesium for 4 hours & avoid Biotin 10/15/18   Unk Pinto, MD  losartan (COZAAR) 100 MG tablet TAKE 1 TABLET BY MOUTH EVERY DAY REPLACES VALSARTAN 03/14/18   Leonie Man, MD  mometasone (NASONEX) 50 MCG/ACT nasal spray INSTILL 1-2 SPRAYS INTO EACH NOSTRIL TWO TIMES A DAY 03/30/19   Liane Comber, NP  nystatin ointment (MYCOSTATIN) Apply 1 application topically 2 (two) times daily. 07/26/18   Vicie Mutters, PA-C    omeprazole (PRILOSEC) 40 MG capsule Take 1 capsule twice daily for Acid Indigestion & Reflux. 10/23/18   Liane Comber, NP  rosuvastatin (CRESTOR) 40 MG tablet TAKE 1 TABLET EVERY DAY, EXCEPT TAKE 2 TABLETS ON MONDAY 02/11/19   Leonie Man, MD  temazepam (RESTORIL) 30 MG capsule TAKE 1 CAPSULE BY MOUTH AT BEDTIME ONLY IF NEEDED 01/07/19   Vicie Mutters, PA-C  triamcinolone ointment (KENALOG) 0.1 % Apply 1 application topically 2 (two) times daily. 07/26/18   Vicie Mutters, PA-C    Physical Exam: Vitals:   04/04/19 1430 04/04/19 1445 04/04/19 1515 04/04/19 1726  BP: 108/75 (!) 108/58 (!) 118/43 (!) 128/50  Pulse: (!) 44 (!) 41 72 87  Resp: 11 12 11 11   Temp:      TempSrc:      SpO2: 95% 93% 99% 96%  Weight:      Height:        Constitutional: NAD, calm, comfortable, on room air Eyes: PERRL, lids and conjunctivae normal ENMT: Mucous membranes are moist. Posterior pharynx clear of any exudate or lesions.Normal dentition.  Neck: normal, supple, no masses, no thyromegaly Respiratory: clear to auscultation bilaterally, no wheezing, no crackles. Normal respiratory effort. No accessory muscle use.  Cardiovascular: Regular rate and rhythm, no murmurs / rubs / gallops. No extremity edema. 2+ pedal pulses. No carotid bruits.  Abdomen: no tenderness, no masses palpated. No hepatosplenomegaly. Bowel sounds positive.  Musculoskeletal: no clubbing / cyanosis. No joint deformity upper and lower extremities. Good ROM, no contractures. Normal muscle tone.  Skin: no rashes, lesions, ulcers. No induration Neurologic: CN 2-12 grossly intact. Sensation intact, DTR normal. Strength 5/5 in all 4.  Psychiatric: Normal judgment and insight. Alert and oriented x 3. Normal mood.    Labs on Admission: I have personally reviewed following labs and imaging studies  CBC: Recent Labs  Lab 04/04/19 1251  WBC 6.2  NEUTROABS 4.9  HGB 11.6*  HCT 36.4  MCV 90.1  PLT 99991111   Basic Metabolic  Panel: Recent Labs  Lab 04/04/19 1251  NA 138  K 2.9*  CL 102  CO2 23  GLUCOSE 104*  BUN 18  CREATININE 2.19*  CALCIUM 8.5*  MG 2.0   GFR: Estimated Creatinine Clearance: 29.7 mL/min (A) (by C-G formula based on SCr of 2.19 mg/dL (H)). Liver Function Tests: No results for input(s): AST, ALT, ALKPHOS, BILITOT, PROT, ALBUMIN in the last 168 hours. No results for input(s): LIPASE, AMYLASE in the last 168 hours. No results for input(s): AMMONIA in the last 168 hours. Coagulation Profile: No results for input(s): INR, PROTIME in the last 168 hours. Cardiac Enzymes: No results for input(s): CKTOTAL, CKMB, CKMBINDEX, TROPONINI in the last 168 hours. BNP (last 3 results) No results for input(s): PROBNP in the last 8760 hours. HbA1C: No results for input(s): HGBA1C in the last 72 hours. CBG: No results for input(s): GLUCAP in the last 168 hours. Lipid Profile: No results for input(s): CHOL, HDL, LDLCALC, TRIG, CHOLHDL, LDLDIRECT in the last 72 hours. Thyroid Function Tests: No results for input(s): TSH, T4TOTAL, FREET4, T3FREE, THYROIDAB in the last 72 hours. Anemia Panel: No results for input(s): VITAMINB12, FOLATE, FERRITIN, TIBC, IRON, RETICCTPCT in the last 72 hours. Urine analysis:    Component Value Date/Time   COLORURINE YELLOW 01/17/2019 1453   APPEARANCEUR CLEAR 01/17/2019 1453   LABSPEC 1.008 01/17/2019 1453   PHURINE 5.5 01/17/2019 1453   GLUCOSEU NEGATIVE 01/17/2019 1453   HGBUR NEGATIVE 01/17/2019 1453   BILIRUBINUR NEGATIVE 11/24/2016 1050   KETONESUR NEGATIVE 01/17/2019 1453   PROTEINUR NEGATIVE 01/17/2019 1453   UROBILINOGEN 0.2 06/16/2014 1523   NITRITE NEGATIVE 01/17/2019 1453   LEUKOCYTESUR NEGATIVE 01/17/2019 1453    Radiological Exams on Admission: DG Chest Port 1 View  Result Date: 04/04/2019 CLINICAL DATA:  73 year old who tested positive for COVID-19 today, complaining of intermittent fevers over the past 5 days. Bradycardia with heart rate in  the 40s at urgent care earlier today. EXAM: PORTABLE CHEST 1 VIEW COMPARISON:  09/12/2016 and earlier. FINDINGS: Cardiomediastinal silhouette unremarkable and unchanged. Minimal scarring in the INFERIOR LEFT UPPER LOBE, unchanged. Lungs otherwise clear. No localized airspace consolidation. No pleural effusions. No pneumothorax. Normal pulmonary vascularity. IMPRESSION: 1. No acute cardiopulmonary disease. 2. Stable scarring in the INFERIOR LEFT UPPER LOBE. Electronically Signed   By: Evangeline Dakin M.D.   On: 04/04/2019 14:08    EKG: Sinus tachycardia, bigeminy, LBBB.  Assessment/Plan Principal Problem:   Bradycardia Active Problems:   Hyperlipidemia   Hypertension   GERD   Chronic combined systolic (congestive) and diastolic (congestive) heart failure (HCC)   Morbid obesity ([facility])   Depression   Hypokalemia   AKI (acute kidney injury) ([place])   COVID-19 virus infection   Bradycardia with ventricular bigeminy: -Patient is asymptomatic.  Reviewed EKG.  Initial troponin negative.  Blood pressure: WNL. -Could be due to underlying COVID-19 infection and dehydration due to decreased p.o. intake. -Potassium is 2.9, magnesium: WNL, chest x-ray negative, -Last echo done in 2017 which showed ejection fraction of 40 to 55% with grade 1 diastolic dysfunction. -We will admit patient on the floor for close monitoring.  On telemetry. -We will replace the potassium and monitor her electrolytes closely. -Check TSH and will order transthoracic echo. -EDP consulted cardiology-recommended no further cardiac work-up is indicated.  COVID-19 infection: -Patient is febrile with fever of 102.4, no leukocytosis. -We will repeat COVID-19 test-no documentation available from urgent care -Reviewed chest x-ray. -Patient is currently on room air and not requiring oxygen. -Discussed with pharmacy-patient is not requiring oxygen and maintaining oxygen saturation more than 94% on room air.  Chest x-ray: No acute  findings.  No indication  of steroids or remdesivir at this point. -We will order inflammatory markers.  Tylenol as needed for fever more than 100.4.  AKI on CKD stage III: -Creatinine 2.19/GFR: 22. -Likely secondary to decreased p.o. intake -Start with gentle hydration and monitor kidney function closely.  Avoid nephrotoxic medication.  Hypokalemia: -Likely secondary to decreased p.o. intake and diarrhea -Replenished.  Magnesium level: WNL -Repeat BMP tomorrow a.m.  Chronic combined systolic and diastolic congestive heart failure: Not in acute exacerbation. -No signs of fluid overload noted on exam.  Chest x-ray is negative. -Reviewed echo from 10/2015 which showed ejection fraction of 45 to 50% with grade 1 diastolic dysfunction. -Strict INO's and daily weight.  Monitor signs for fluid overload.  Monitor electrolytes closely. -Repeat echo.  Hypertension: Blood pressure is at baseline -We will monitor her blood pressure closely.  Depression/anxiety: -Continue her home meds.  Hyperlipidemia: Continue statin  GERD: Continue PPI  Unable to safely start patient's home medication as pharmacy medication reconciliation is pending.  DVT prophylaxis: TED/SCD/Eliquis Code Status: Full code-confirmed with the patient Family Communication: None present at bedside.  Plan of care discussed with patient in length and she verbalized understanding and agreed with it. Disposition Plan: TBD Consults called: Cardiology by EDP Admission status: Inpatient   Mckinley Jewel MD [facility] Pager [phone]  If 7PM-7AM, please contact night-coverage [email or web address] Password [facility]  04/04/2019, 5:26 PM

## 2019-04-04 NOTE — ED Triage Notes (Signed)
Pt at urgent care and tested positive today for COVID. Been feeling poorly x5 DAYS. Hr IN THE 4O'S at urgent care.

## 2019-04-04 NOTE — Consult Note (Addendum)
Cardiology Consultation:   Patient ID: [name] MRN: [id number]; DOB: 1945/11/03  Admit date: 04/04/2019 Date of Consult: 04/04/2019  Primary Care Provider: Unk Pinto, MD Primary Cardiologist: Glenetta Hew, MD  Primary Electrophysiologist:  None    Patient Profile:   [name] [name] is a 73 y.o. female with a history of normal coronaries on cardiac catheterization in 08/2015, chronic combined CHF/non-ischemic cardiomyopathy with EF of 45-50% on Echo in 10/2015, LBBB, paroxysmal atrial fibrillation [name], hypertension, hyperlipidemia, pre-diabetes, hypothyroidism, CKD stage III, chronic back pain, and breast cancer followed by Oncology  at who is being seen today for the evaluation of frequent PVCs at the request of Dr. Langston Masker (Emergency Department).  History of Present Illness:   [name] is a 73 year old female with the above history who is followed by Dr. Ellyn Hack. Patient admitted in 07/2015 for chest pain. Echo during admission showed LVEF of 25-30% with grade 2 DD, paradoxical septal wall motion abnormality, PA peak pressure 34 mmHg, small to moderate free-flowing pericardial effusion circumferential to the heart. She was also noted to be in new atrial fibrillation at that time but quickly returned to sinus rhythm. She underwent right/left cardiac catheterization which showed normal coronaries and normal filling pressures.   Patient was last seen by Coletta Memos, NP, in 10/2018 at which time she was doing well from a cardiac standpoint. 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 No chest pain or CHF symptoms at that time. She did note some occasional palpitations about once per month that were easily corrected with vagal maneuvers. No medication changes were made and she was advised to follow-up in 6 months.   Patient presented to Urgent Care earlier today and  tested positive for COVID. Her heart rates were reportedly noted to be in the 40's at that time so patient was advised to come to the ED.  In the ED, EKG showed sinus rhythm with LBBB and bigeminy PVC. Initial high-sensitivity troponin negative. Chest x-ray showed no acute findings. WBC 6.2, Hgb 11.6. Plts 245. Na 138, K 2.9, Glucose 104, BUN 18, Cr 2.19.   Cardiology consulted given frequent PVCs. Given patient's COVID positive status, I called into the patient's room to get a history. Patient started feeling bad on Saturday when she developed an "itchy throat," cough, fatigue, nausea, vomiting, hot flashes, and fevers. She got tested for COVID on Monday and it came back negative. However, repeat test today came back positive today. She notes an episode of lightheadedness yesterday and states she felt like she was going to pass out. She states she was feeling very fatigued and sweating profusely at this time. She states she has not been eating much the last few days but has been drinking a lot of water. Otherwise, seems to be doing okay from a cardiac standpoint. She denies any chest pain, shortness of breath, palpitations, falls, syncope, orthopnea, PND, or lower extremity edema.   Heart Pathway Score:     Past Medical History:  Diagnosis Date  . Allergy    Nasonex daily as needed  . Anemia   . Anxiety   . Arthritis   . Breast cancer ([place])     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  . Chronic back pain    cyst sitting on L4-5;slipped disc  . Depression    takes Cymbalta daily  .  Diverticulosis   . Dysrhythmia   . GERD (gastroesophageal reflux disease)    takes Omeprazole daily  . Headache(784.0)    rare  . History of gastric ulcer at age 80  . History of pancreatitis 03/27/2008  . History of shingles   . History of ulcer disease 08/06/2015  . Hyperlipidemia   . Hypertension    takes Cardura nightly and Verapamil daily  . Hypothyroidism   . Insomnia    takes  Restoril nightly  . Joint pain   . Nonischemic cardiomyopathy ([place]) 07/2015   Normal coronary arteries by cath. EF was 25-30% by echo. GR2 DD - likely related to LBBB in setting of A. fib  . Obesity (BMI 30-39.9) 06/11/2013  . Paroxysmal atrial fibrillation ([place]): CHA2DS2-VASc Score - starting Eliquis 08/06/2015   This patients CHA2DS2-VASc Score and unadjusted Ischemic Stroke Rate (% per year) is equal to 4.8 % stroke rate/year from a score of 4  Above score calculated as 1 point each if present [CHF, HTN, DM, Vascular=MI/PAD/Aortic Plaque, Age if 65-74, or Female]; 2 points each if present [Age > 75, or Stroke/TIA/TE]  . PONV (postoperative nausea and vomiting)   . S/P radiation therapy 07/18/2013-08/28/2013   1) Left Breast / 50 Gy in 25 fractions/ 2) Left Breast Boost / 10 Gy in 5 fractions  . Shingles   . Sinus drainage    put on Levaquin yesterday if no better in 3 days will start prednisone  . Thyroid disease   . Weakness    tingling and numbness both hands and left leg  . Wears glasses     Past Surgical History:  Procedure Laterality Date  . ABDOMINAL HYSTERECTOMY    . BREAST LUMPECTOMY Left    neg  . CARDIAC CATHETERIZATION N/A 08/10/2015   Procedure: Right/Left Heart Cath and Coronary Angiography;  Surgeon: Burnell Blanks, MD;  Location: [facility];  Service: Cardiovascular;: Nonobstructive CAD  . CATARACT EXTRACTION BILATERAL W/ ANTERIOR VITRECTOMY Bilateral   . CHOLECYSTECTOMY    . COLONOSCOPY  2014  . DUPUYTREN / PALMAR FASCIOTOMY Bilateral 2007   x 3 to left and once to the right   . epidural injections     x 3  . FOOT TENDON SURGERY Right ~ 11/2015   "stepped in a hole and tore 2 tendons"  . JOINT REPLACEMENT    . KNEE ARTHROSCOPY Bilateral   . LUMBAR FUSION  10/2013   Dr. Lynann Bologna  . TONSILLECTOMY    . TOTAL KNEE ARTHROPLASTY Left 09/23/2016  . TOTAL KNEE ARTHROPLASTY Left 09/23/2016   Procedure: TOTAL KNEE ARTHROPLASTY;  Surgeon: Dorna Leitz, MD;   Location: [facility];  Service: Orthopedics;  Laterality: Left;  . TRANSTHORACIC ECHOCARDIOGRAM  08/07/2015   Severely reduced LVEF at 25-30% with diffuse HK. GR 2 DD. Ventricular dyssynergy secondary to LBBB. Small to moderate pericardial effusion but no hemodynamic compromise  . TRANSTHORACIC ECHOCARDIOGRAM  10/2015    (EF up from 25-30%): - Left ventricle: Poor acoustic windows -  difficult to see.    EF estimated at 45-50% with inferoseptal and possible posterior hypokinesis. GR 1 DD. No pericardial effusion.  . TUBAL LIGATION    . UPPER GI ENDOSCOPY       Home Medications:  Prior to Admission medications   Medication Sig Start Date End Date Taking? Authorizing Provider  buPROPion (WELLBUTRIN XL) 150 MG 24 hr tablet TAKE 1 TABLET BY MOUTH EVERY DAY IN THE MORNING 10/11/18   Vicie Mutters, PA-C  carvedilol (COREG) 25 MG tablet Take 1 tablet 2 x /day for BP & Heart 10/15/18   Unk Pinto, MD  chlorhexidine (PERIDEX) 0.12 % solution SWISH 1 CAPFUL FOR 30 SECONDS TWICE A DAY *SPIT OUT AFTER USE* *START 1 WEEK PRIOR TO SURGERY* 05/22/18   [provider]  Cholecalciferol (VITAMIN D) 2000 UNITS tablet Take 4,000 Units by mouth daily.    [provider]  Coenzyme Q10 (COQ10) 100 MG CAPS Take 300 mg by mouth daily. 06/07/17   Leonie Man, MD  docusate sodium (COLACE) 100 MG capsule Take 1 capsule (100 mg total) by mouth 2 (two) times daily. 09/23/16   Gary Fleet, PA-C  DULoxetine (CYMBALTA) 30 MG capsule TAKE ONE CAPSULE 3 TIMES A DAY 10/11/18   Liane Comber, NP  ELIQUIS 5 MG TABS tablet TAKE 1 TABLET BY MOUTH TWICE A DAY 01/29/19   Leonie Man, MD  ferrous sulfate 325 (65 FE) MG tablet Take 1 tablet 2 x /day with Meals for Low Iron 01/10/19   Unk Pinto, MD  furosemide (LASIX) 20 MG tablet Take 1 tablet 2 x /day for BP & Fluid 10/15/18   Unk Pinto, MD  gabapentin (NEURONTIN) 300 MG capsule Take 1 capsule (300 mg total) by mouth 2 (two) times daily. 07/17/18    Vicie Mutters, PA-C  ipratropium (ATROVENT) 0.03 % nasal spray Place 2 sprays into the nose 3 (three) times daily. 06/28/16 09/24/18  Unk Pinto, MD  ketoconazole (NIZORAL) 2 % shampoo Apply 1 application topically 2 (two) times a week. 12/13/18   Liane Comber, NP  levothyroxine (SYNTHROID) 50 MCG tablet Take 1/2 to 1 tablet daily as directed on an empty stomach with only water for 30 minutes & no Antacid meds, Calcium or Magnesium for 4 hours & avoid Biotin 10/15/18   Unk Pinto, MD  losartan (COZAAR) 100 MG tablet TAKE 1 TABLET BY MOUTH EVERY DAY REPLACES VALSARTAN 03/14/18   Leonie Man, MD  mometasone (NASONEX) 50 MCG/ACT nasal spray INSTILL 1-2 SPRAYS INTO EACH NOSTRIL TWO TIMES A DAY 03/30/19   Liane Comber, NP  nystatin ointment (MYCOSTATIN) Apply 1 application topically 2 (two) times daily. 07/26/18   Vicie Mutters, PA-C  omeprazole (PRILOSEC) 40 MG capsule Take 1 capsule twice daily for Acid Indigestion & Reflux. 10/23/18   Liane Comber, NP  rosuvastatin (CRESTOR) 40 MG tablet TAKE 1 TABLET EVERY DAY, EXCEPT TAKE 2 TABLETS ON MONDAY 02/11/19   Leonie Man, MD  temazepam (RESTORIL) 30 MG capsule TAKE 1 CAPSULE BY MOUTH AT BEDTIME ONLY IF NEEDED 01/07/19   Vicie Mutters, PA-C  triamcinolone ointment (KENALOG) 0.1 % Apply 1 application topically 2 (two) times daily. 07/26/18   Vicie Mutters, PA-C    Inpatient Medications: Scheduled Meds:  Continuous Infusions: . potassium chloride     PRN Meds: potassium chloride  Allergies:    Allergies  Allergen Reactions  . Requip [Ropinirole Hcl] Shortness Of Breath and Nausea And Vomiting  . Minocycline Hives  . Tetracyclines & Related Hives  . Zestril [Lisinopril] Cough  . Zetia [Ezetimibe] Other (See Comments)    MYALGIA JOINT PAIN  . Amoxicillin Rash  . Codeine Other (See Comments)    fatigue  . Morphine Sulfate Other (See Comments)    Palpitations  . Sulfa Antibiotics Other (See Comments)    Headache (pt  states that a blood pressure medicine caused a severe headache but doesn't remember a reaction to sulfa)    Social History:   Social History  Socioeconomic History  . Marital status: Widowed    Spouse name: Not on file  . Number of children: Not on file  . Years of education: Not on file  . Highest education level: Not on file  Occupational History  . Not on file  Tobacco Use  . Smoking status: Never Smoker  . Smokeless tobacco: Never Used  Substance and Sexual Activity  . Alcohol use: Yes    Comment: wine occasionally-rarely  . Drug use: No  . Sexual activity: Not on file  Other Topics Concern  . Not on file  Social History Narrative  . Not on file   Social Determinants of Health   Financial Resource Strain:   . Difficulty of Paying Living Expenses: Not on file  Food Insecurity:   . Worried About Charity fundraiser in the Last Year: Not on file  . Ran Out of Food in the Last Year: Not on file  Transportation Needs:   . Lack of Transportation (Medical): Not on file  . Lack of Transportation (Non-Medical): Not on file  Physical Activity:   . Days of Exercise per Week: Not on file  . Minutes of Exercise per Session: Not on file  Stress:   . Feeling of Stress : Not on file  Social Connections:   . Frequency of Communication with Friends and Family: Not on file  . Frequency of Social Gatherings with Friends and Family: Not on file  . Attends Religious Services: Not on file  . Active Member of Clubs or Organizations: Not on file  . Attends Archivist Meetings: Not on file  . Marital Status: Not on file  Intimate Partner Violence:   . Fear of Current or Ex-Partner: Not on file  . Emotionally Abused: Not on file  . Physically Abused: Not on file  . Sexually Abused: Not on file    Family History:    Family History  Problem Relation Age of Onset  . Heart attack Mother   . Stroke Mother   . COPD Father   . Hypertension Father   . Hyperlipidemia Sister    . Colon polyps Neg Hx   . Esophageal cancer Neg Hx   . Rectal cancer Neg Hx   . Stomach cancer Neg Hx      ROS:  Please see the history of present illness.  All other ROS reviewed and negative.     Physical Exam/Data:   Vitals:   04/04/19 1300 04/04/19 1315 04/04/19 1330 04/04/19 1338  BP: (!) 127/45 (!) 124/46 116/65   Pulse: (!) 47 (!) 46 (!) 47   Resp: 12 11 15    Temp:      TempSrc:      SpO2: 94% 94% 95%   Weight:    113.4 kg  Height:    5\' 7"  (1.702 m)   No intake or output data in the 24 hours ending 04/04/19 1421 Last 3 Weights 04/04/2019 01/17/2019 10/29/2018  Weight (lbs) 250 lb 243 lb 237 lb  Weight (kg) 113.399 kg 110.224 kg 107.502 kg     Body mass index is 39.16 kg/m.   Patient COVID positive; therefore, did not going into the patient's room. Spoke with patient over the phone so exam limited:  General:  No acute distress. Cardiac: Regular rate with frequent ectopy per telemetry. Lungs: No audible wheezes. Able to talk in complete sentences without any shortness of breath. Neuro: Alert and oriented x3. Responds to questions appropriately. Psych:  Normal affect.  MD to follow with full exam.  EKG:  The EKG was personally reviewed and demonstrates: Sinus tachycardia, rate of 106 bpm, with known LBBB and bigeminy PVCs.  Telemetry:  Telemetry was personally reviewed and demonstrates:  Normal sinus rhythm with rates in the 80's to 90's with bigeminy PVCs.  Relevant CV Studies:  Echocardiogram 08/07/2015: Study Conclusions: - Procedure narrative: Transthoracic echocardiography. Image   quality was poor. The study was technically difficult, as a   result of poor sound wave transmission and body habitus. - Left ventricle: The cavity size was normal. Wall thickness was   normal. Systolic function was severely reduced. The estimated   ejection fraction was in the range of 25% to 30%. Severe diffuse   hypokinesis with no identifiable regional variations.  Features   are consistent with a pseudonormal left ventricular filling   pattern, with concomitant abnormal relaxation and increased   filling pressure (grade 2 diastolic dysfunction). - Ventricular septum: Septal motion showed abnormal function,   dyssynergy, and paradox. These changes are consistent with a left   bundle branch block. - Left atrium: The atrium was mildly dilated. - Atrial septum: No defect or patent foramen ovale was identified. - Pulmonary arteries: Systolic pressure was mildly increased. PA   peak pressure: 34 mm Hg (S). - Pericardium, extracardiac: A small to moderate, free-flowing   pericardial effusion was identified circumferential to the heart.   The fluid had no internal echoes.There was no evidence of   hemodynamic compromise.  Impressions: - The pericardial effusion and severe LV dysfunction are new since   2008. _______________  Right/Left Cardiac Catheterization 08/10/2015: 1. Non-ischemic cardiomyopathy 2. No angiographic evidence of CAD 3. Normal filling pressures.   Recommendations: Medical management of non-ischemic cardiomyopathy. New diagnosis of atrial fibrillation. Her cardiomyopathy could be rate related.  _______________  Echocardiogram 10/22/2015: Study Conclusions: - Left ventricle: Poor acoustic windows limit study Endocardium is   difficult to see   Overall LVEF is approximatey 45 to 50% with inferooseptal   hypokeihnesis and possible posterior hypokinesis. The cavity size   was normal. Wall thickness was normal. Doppler parameters are   consistent with abnormal left ventricular relaxation (grade 1   diastolic dysfunction).  Laboratory Data:  High Sensitivity Troponin:   Recent Labs  Lab 04/04/19 1251  TROPONINIHS 8     Chemistry Recent Labs  Lab 04/04/19 1251  NA 138  K 2.9*  CL 102  CO2 23  GLUCOSE 104*  BUN 18  CREATININE 2.19*  CALCIUM 8.5*  GFRNONAA 22*  GFRAA 25*  ANIONGAP 13    No results for input(s):  PROT, ALBUMIN, AST, ALT, ALKPHOS, BILITOT in the last 168 hours. Hematology Recent Labs  Lab 04/04/19 1251  WBC 6.2  RBC 4.04  HGB 11.6*  HCT 36.4  MCV 90.1  MCH 28.7  MCHC 31.9  RDW 13.7  PLT 245   BNPNo results for input(s): BNP, PROBNP in the last 168 hours.  DDimer No results for input(s): DDIMER in the last 168 hours.   Radiology/Studies:  DG Chest Port 1 View  Result Date: 04/04/2019 CLINICAL DATA:  73 year old who tested positive for COVID-19 today, complaining of intermittent fevers over the past 5 days. Bradycardia with heart rate in the 40s at urgent care earlier today. EXAM: PORTABLE CHEST 1 VIEW COMPARISON:  09/12/2016 and earlier. FINDINGS: Cardiomediastinal silhouette unremarkable and unchanged. Minimal scarring in the INFERIOR LEFT UPPER LOBE, unchanged. Lungs otherwise clear. No localized airspace consolidation. No pleural effusions. No pneumothorax. Normal  pulmonary vascularity. IMPRESSION: 1. No acute cardiopulmonary disease. 2. Stable scarring in the INFERIOR LEFT UPPER LOBE. Electronically Signed   By: Evangeline Dakin M.D.   On: 04/04/2019 14:08   {  Assessment and Plan:   Bigeminy PVCs - EKG and telemetry shows sinus rhythm with known LBBB and bigeminy PVC. - Initial high-sensitivity troponin negative. - Potassium 2.8. Continue aggressive supplementation. - Magnesium 2.0. - Continue home Coreg 25mg  twice daily if BP allows. Could consider transitioning to Metoprolol which does not usually have as much affect on BP. - May be due to poor PO intake, dehydration, in setting of COVID and electrolyte abnormalities. Patient had normal coronaries on cardiac catheterization in 08/2015 so do not suspect this is cardiac in nature.   Chronic Combined CHF/Non-Ischemic Cardiomyopathy - Most recent Echo in 10/2015 showed LVEF of 45-50% with inferoseptal hypokinesis and possible posterior hypokinesis as well as grade 1 diastolic dysfunction. EF improved from 25-30% in  07/2015.  - Patient denies any CHF symptoms. - Will defer diuretic to MD who will evaluate volume status. - Continue home Coreg 25mg  twice daily if BP allows. - Hold home Losartan given AKI.  Paroxymal Atrial Fibrillation  - Currently in sinus rhythm.  - Continue beta-blocker. - Continue chronic anticoagulation with Eliquis.  Hypertension - BP currently well controlled - more on the soft side. - Continue home Coreg as BP allows. - Hold Losartan as above.  Hyperlipidemia - Continue home statin.  AKI - Creatinine 2.19 on presentation. Baseline around 0.9. - Likely due to poor PO intake/dehydration in setting of COVID. - Continue gentle hydration but will need to continue to monitor volume status closely given known cardiomyopathy.    COVID Positive  - Febrile with temp of 102.4. No leukocytosis. - Management per primary team.   For questions or updates, please contact [place] Please consult [email or web address] for contact info under   Signed, Darreld Mclean, PA-C  04/04/2019 2:21 PM  History and all data above reviewed.  Patient examined.  I agree with the findings as above.  The patient has a history of a nonischemic cardiomyopathy.  She has a mildly reduced ejection fraction.  However, recently she not had any acute cardiovascular complaints.  She has had some skipping heartbeats for years.  She has some chronic dyspnea with exertion but this has not been worse than previous.  She has not had any chest pressure, neck or arm discomfort.  Her biggest issue was that she was having anorexia.  She was vomiting.  She was weak.  She had been exposed to Covid tested negative but today tested positive.  She comes in with an elevated creatinine and suggestion of dehydration.  She is hypokalemic.  She is also noted to have ventricular bigeminy.  Her blood pressure is at her baseline.  She is not had any presyncope or syncope.  She has not felt any tachypalpitations that she had with her  atrial fibrillation in the past.  The patient exam reveals  GEN: No  acute distress.   Neck: No  JVD Cardiac: RRR, no murmurs, rubs, or gallops.  Respiratory:   Normal GI: Soft, nontender, non-distended, normal bowel sounds  MS:  No edema; No deformity. Neuro:   Nonfocal  Psych: Oriented and appropriate   All available labs, radiology testing, previous records reviewed. Agree with documented assessment and plan. Ventricular bigeminy:  We were called to assess this.  She does not feel this.  In part, this is likely related to  hypokalemia and acute dehydration and illness.  Undercounting the PVCs leads to her recorderd HR of 40s.  However, she has a BP that is currently normal for her.  No further cardiac work up is indicated as she is hydrated gently and her potassium [place] supplemented.  She should remain on tele if she is admitted.         Jeneen Rinks Drue Harr  4:23 PM  04/04/2019

## 2019-04-04 NOTE — ED Notes (Signed)
Informed pt that we would need urine sample if she is able.

## 2019-04-04 NOTE — ED Provider Notes (Signed)
[facility]ERGENCY DEPARTMENT Provider Note   CSN: [id number] Arrival date & time: 04/04/19  1239     History Chief Complaint  Patient presents with  . Bradycardia    [name] [name] is a 73 y.o. female w/ hx of parox A Fib on eliquis, HTN, HLD presenting to the emergency department with fatigue and bradycardia in the setting of recent Covid diagnosis.  The patient reports she has had approximately 5 days of generalized weakness and diminished appetite.  She says she has had myalgias and feeling poor and rundown.  She says she has had very little to eat except for a couple saltines.  She has diminished appetite and feels nauseated, she also had 1 or 2 episodes of vomiting in the past few days.  There is been no diarrhea.  She went to an urgent care today and had a rapid Covid test which was positive.  Apparently the urgent care heart rate was in the 40s.  She was brought to the ED by EMS for evaluation of bradycardia.  The patient does report some lightheadedness, which is ongoing for the past few days.  She takes Coreg daily and has been on 25 mg twice daily for a long time.  She also takes Eliquis for her atrial fibrillation.  She states that she normally knows when she goes into A. fib, does not currently feel that she is having the symptoms.  He denies any chest pain or pressure.  She denies any history of MI or cardiac stents.  She denies any smoking history.  She denies any history of diabetes.  She denies any dysuria, hematuria, or history of UTIs.  She lives by herself in a condo with her dog.  HPI     Past Medical History:  Diagnosis Date  . Anemia   . Anxiety   . Arthritis   . 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 Chronic back pain    cyst sitting on L4-5;slipped disc  . Depression    takes Cymbalta daily  . Diverticulosis   . Dysrhythmia   . GERD (gastroesophageal reflux disease)    takes  Omeprazole daily  . Headache(784.0)    rare  . History of gastric ulcer at age 55  . History of pancreatitis 03/27/2008  . History of ulcer disease 08/06/2015  . Hyperlipidemia   . Hypertension    takes Cardura nightly and Verapamil daily  . Hypothyroidism   . Insomnia    takes Restoril nightly  . Nonischemic cardiomyopathy ([place]) 07/2015   Normal coronary arteries by cath. EF was 25-30% by echo. GR2 DD - likely related to LBBB in setting of A. fib  . Obesity (BMI 30-39.9) 06/11/2013  . Paroxysmal atrial fibrillation ([place]): CHA2DS2-VASc Score - starting Eliquis 08/06/2015   This patients CHA2DS2-VASc Score and unadjusted Ischemic Stroke Rate (% per year) is equal to 4.8 % stroke rate/year from a score of 4  Above score calculated as 1 point each if present [CHF, HTN, DM, Vascular=MI/PAD/Aortic Plaque, Age if 65-74, or Female]; 2 points each if present [Age > 75, or Stroke/TIA/TE]  . PONV (postoperative nausea and vomiting)   . S/P radiation therapy 07/18/2013-08/28/2013   1) Left Breast / 50 Gy in 25 fractions/ 2) Left Breast Boost / 10 Gy in 5 fractions  . Shingles     Patient Active Problem List   Diagnosis Date Noted  . Bradycardia   .  Depression   . Hypokalemia   . AKI (acute kidney injury) ([place])   . COVID-19 virus infection   . CKD (chronic kidney disease) stage 3, GFR 30-59 ml/min 09/21/2018  . Morbid obesity ([place]) 06/14/2017  . Primary osteoarthritis of left knee 09/23/2016  . Non-ischemic cardiomyopathy ([place])   . Chronic combined systolic (congestive) and diastolic (congestive) heart failure ([place]) 08/08/2015  . Paroxysmal atrial fibrillation [facility]): CHA2DS2-VASc Score - starting Eliquis 08/06/2015  . Hypothyroidism 08/06/2015  . Absolute anemia   . Encounter for Medicare annual wellness exam 03/31/2015  . DDD (degenerative disc disease), lumbar 06/16/2014  . Other abnormal glucose (prediabetes) 12/18/2013  . Vitamin D deficiency 12/18/2013  . Medication management 12/18/2013    . Paroxysmal supraventricular tachycardia ([place]) 10/18/2013  . Radiculopathy 10/16/2013  . Malignant neoplasm of lower-inner quadrant of left breast in female, estrogen receptor positive ([place]) 05/20/2013  . Hyperlipidemia 03/13/2008  . Depression, major, recurrent, in partial remission ([place]) 03/13/2008  . Hypertension 03/13/2008  . H/O left bundle branch block 03/13/2008  . Allergic rhinitis 03/13/2008  . GERD 03/13/2008    Past Surgical History:  Procedure Laterality Date  . ABDOMINAL HYSTERECTOMY    . BREAST LUMPECTOMY Left    neg  . CARDIAC CATHETERIZATION N/A 08/10/2015   Procedure: Right/Left Heart Cath and Coronary Angiography;  Surgeon: Burnell Blanks, MD;  Location: [facility];  Service: Cardiovascular;: Nonobstructive CAD  . CATARACT EXTRACTION BILATERAL W/ ANTERIOR VITRECTOMY Bilateral   . CHOLECYSTECTOMY    . COLONOSCOPY  2014  . DUPUYTREN / PALMAR FASCIOTOMY Bilateral 2007   x 3 to left and once to the right   . epidural injections     x 3  . FOOT TENDON SURGERY Right ~ 11/2015   "stepped in a hole and tore 2 tendons"  . JOINT REPLACEMENT    . KNEE ARTHROSCOPY Bilateral   . LUMBAR FUSION  10/2013   Dr. Lynann Bologna  . TONSILLECTOMY    . TOTAL KNEE ARTHROPLASTY Left 09/23/2016  . TOTAL KNEE ARTHROPLASTY Left 09/23/2016   Procedure: TOTAL KNEE ARTHROPLASTY;  Surgeon: Dorna Leitz, MD;  Location: [facility];  Service: Orthopedics;  Laterality: Left;  . TRANSTHORACIC ECHOCARDIOGRAM  08/07/2015   Severely reduced LVEF at 25-30% with diffuse HK. GR 2 DD. Ventricular dyssynergy secondary to LBBB. Small to moderate pericardial effusion but no hemodynamic compromise  . TRANSTHORACIC ECHOCARDIOGRAM  10/2015    (EF up from 25-30%): - Left ventricle: Poor acoustic windows -  difficult to see.    EF estimated at 45-50% with inferoseptal and possible posterior hypokinesis. GR 1 DD. No pericardial effusion.  . TUBAL LIGATION    . UPPER GI ENDOSCOPY       OB History   No  obstetric history on file.     Family History  Problem Relation Age of Onset  . Heart attack Mother   . Stroke Mother   . COPD Father   . Hypertension Father   . Hyperlipidemia Sister   . Colon polyps Neg Hx   . Esophageal cancer Neg Hx   . Rectal cancer Neg Hx   . Stomach cancer Neg Hx     Social History   Tobacco Use  . Smoking status: Never Smoker  . Smokeless tobacco: Never Used  Substance Use Topics  . Alcohol use: Yes    Comment: wine occasionally-rarely  . Drug use: No    Home Medications Prior to Admission medications   Medication Sig Start Date End Date  Taking? Authorizing Provider  benzonatate (TESSALON) 100 MG capsule Take 100 mg by mouth 3 (three) times daily as needed for cough. For up to 7 days 04/01/19 04/08/19  [provider]  buPROPion (WELLBUTRIN XL) 150 MG 24 hr tablet TAKE 1 TABLET BY MOUTH EVERY DAY IN THE MORNING Patient taking differently: Take 150 mg by mouth daily.  10/11/18   Vicie Mutters, PA-C  carvedilol (COREG) 25 MG tablet Take 1 tablet 2 x /day for BP & Heart Patient taking differently: Take 25 mg by mouth 2 (two) times daily.  10/15/18   Unk Pinto, MD  chlorhexidine (PERIDEX) 0.12 % solution SWISH 1 CAPFUL FOR 30 SECONDS TWICE A DAY *SPIT OUT AFTER USE* *START 1 WEEK PRIOR TO SURGERY* 05/22/18   [provider]  Cholecalciferol (VITAMIN D) 2000 UNITS tablet Take 4,000 Units by mouth daily.    [provider]  Coenzyme Q10 (COQ10) 100 MG CAPS Take 300 mg by mouth daily. 06/07/17   Leonie Man, MD  docusate sodium (COLACE) 100 MG capsule Take 1 capsule (100 mg total) by mouth 2 (two) times daily. 09/23/16   Gary Fleet, PA-C  DULoxetine (CYMBALTA) 30 MG capsule TAKE ONE CAPSULE 3 TIMES A DAY Patient taking differently: Take 30 mg by mouth 3 (three) times daily.  10/11/18   Liane Comber, NP  ELIQUIS 5 MG TABS tablet TAKE 1 TABLET BY MOUTH TWICE A DAY Patient taking differently: Take 5 mg by mouth 2 (two)  times daily.  01/29/19   Leonie Man, MD  famotidine (PEPCID) 20 MG tablet Take 20 mg by mouth 2 (two) times daily. 03/21/19   [provider]  ferrous sulfate 325 (65 FE) MG tablet Take 1 tablet 2 x /day with Meals for Low Iron Patient taking differently: Take 325 mg by mouth 2 (two) times daily with a meal.  01/10/19   Unk Pinto, MD  furosemide (LASIX) 20 MG tablet Take 1 tablet 2 x /day for BP & Fluid Patient taking differently: Take 20 mg by mouth 2 (two) times daily.  10/15/18   Unk Pinto, MD  gabapentin (NEURONTIN) 300 MG capsule Take 1 capsule (300 mg total) by mouth 2 (two) times daily. 07/17/18   Vicie Mutters, PA-C  ipratropium (ATROVENT) 0.03 % nasal spray Place 2 sprays into the nose 3 (three) times daily. 06/28/16 09/24/18  Unk Pinto, MD  ketoconazole (NIZORAL) 2 % shampoo Apply 1 application topically 2 (two) times a week. 12/13/18   Liane Comber, NP  levothyroxine (SYNTHROID) 50 MCG tablet Take 1/2 to 1 tablet daily as directed on an empty stomach with only water for 30 minutes & no Antacid meds, Calcium or Magnesium for 4 hours & avoid Biotin Patient taking differently: Take 25-50 mcg by mouth daily. As directed on an empty stomach with only water for 30 minutes & no Antacid meds, Calcium or Magnesium for 4 hours & avoid Biotin 10/15/18   Unk Pinto, MD  losartan (COZAAR) 100 MG tablet TAKE 1 TABLET BY MOUTH EVERY DAY REPLACES VALSARTAN Patient taking differently: Take 100 mg by mouth daily.  03/14/18   Leonie Man, MD  mometasone (NASONEX) 50 MCG/ACT nasal spray INSTILL 1-2 SPRAYS INTO EACH NOSTRIL TWO TIMES A DAY Patient taking differently: Place 1-2 sprays into the nose 2 (two) times daily.  03/30/19   Liane Comber, NP  nystatin ointment (MYCOSTATIN) Apply 1 application topically 2 (two) times daily. 07/26/18   Vicie Mutters, PA-C  omeprazole (PRILOSEC) 40 MG capsule  Take 1 capsule twice daily for Acid Indigestion & Reflux. Patient taking  differently: Take 40 mg by mouth 2 (two) times daily.  10/23/18   Liane Comber, NP  rosuvastatin (CRESTOR) 40 MG tablet TAKE 1 TABLET EVERY DAY, EXCEPT TAKE 2 TABLETS ON MONDAY Patient taking differently: Take 40 mg by mouth See admin instructions. Take 40mg  daily , except take 80mg  on Monday. 02/11/19   Leonie Man, MD  temazepam (RESTORIL) 30 MG capsule TAKE 1 CAPSULE BY MOUTH AT BEDTIME ONLY IF NEEDED Patient taking differently: Take 30 mg by mouth at bedtime as needed for sleep.  01/07/19   Vicie Mutters, PA-C  triamcinolone ointment (KENALOG) 0.1 % Apply 1 application topically 2 (two) times daily. 07/26/18   Vicie Mutters, PA-C    Allergies    Requip [ropinirole hcl], Minocycline, Tetracyclines & related, Zestril [lisinopril], Zetia [ezetimibe], Amoxicillin, Codeine, Morphine sulfate, and Sulfa antibiotics  Review of Systems   Review of Systems  Constitutional: Positive for appetite change, chills and fatigue. Negative for fever.  Respiratory: Positive for shortness of breath. Negative for cough.   Cardiovascular: Negative for chest pain, palpitations and leg swelling.  Gastrointestinal: Positive for nausea and vomiting. Negative for abdominal pain.  Genitourinary: Negative for dysuria and hematuria.  Musculoskeletal: Positive for arthralgias and myalgias.  Skin: Negative for color change and rash.  Neurological: Positive for dizziness, light-headedness and headaches. Negative for seizures and syncope.  Psychiatric/Behavioral: Negative for agitation and confusion.  All other systems reviewed and are negative.   Physical Exam Updated Vital Signs BP (!) 113/42   Pulse 92   Temp (!) 102.4 F (39.1 C) (Oral)   Resp 17   Ht 5\' 7"  (1.702 m)   Wt 113.4 kg   SpO2 92%   BMI 39.16 kg/m   Physical Exam Vitals and nursing note reviewed.  Constitutional:      General: She is not in acute distress.    Appearance: She is well-developed.  HENT:     Head: Normocephalic and  atraumatic.  Eyes:     Conjunctiva/sclera: Conjunctivae normal.  Cardiovascular:     Rate and Rhythm: Normal rate. Rhythm irregular.     Heart sounds: No murmur.     Comments: Hr 90's Pulmonary:     Effort: Pulmonary effort is normal. No respiratory distress.     Comments: 94% on room air Abdominal:     General: There is no distension.     Palpations: Abdomen is soft.     Tenderness: There is no abdominal tenderness.  Musculoskeletal:     Cervical back: Neck supple.  Skin:    General: Skin is warm and dry.  Neurological:     General: No focal deficit present.     Mental Status: She is alert and oriented to person, place, and time.  Psychiatric:        Mood and Affect: Mood normal.        Behavior: Behavior normal.     ED Results / Procedures / Treatments   Labs (all labs ordered are listed, but only abnormal results are displayed) Labs Reviewed  BASIC METABOLIC PANEL - Abnormal; Notable for the following components:      Result Value   Potassium 2.9 (*)    Glucose, Bld 104 (*)    Creatinine, Ser 2.19 (*)    Calcium 8.5 (*)    GFR calc non Af Amer 22 (*)    GFR calc Af Amer 25 (*)    All other  components within normal limits  CBC WITH DIFFERENTIAL/PLATELET - Abnormal; Notable for the following components:   Hemoglobin 11.6 (*)    Lymphs Abs 0.6 (*)    All other components within normal limits  COMPREHENSIVE METABOLIC PANEL - Abnormal; Notable for the following components:   Potassium 3.4 (*)    Creatinine, Ser 2.17 (*)    Calcium 8.2 (*)    Total Protein 6.1 (*)    Albumin 3.4 (*)    GFR calc non Af Amer 22 (*)    GFR calc Af Amer 25 (*)    All other components within normal limits  CBC WITH DIFFERENTIAL/PLATELET - Abnormal; Notable for the following components:   RBC 3.82 (*)    Hemoglobin 10.9 (*)    HCT 34.6 (*)    All other components within normal limits  FIBRINOGEN - Abnormal; Notable for the following components:   Fibrinogen 542 (*)    All other  components within normal limits  LACTATE DEHYDROGENASE - Abnormal; Notable for the following components:   LDH 248 (*)    All other components within normal limits  LIPID PANEL - Abnormal; Notable for the following components:   Triglycerides 163 (*)    HDL 38 (*)    All other components within normal limits  POC SARS CORONAVIRUS 2 AG -  ED - Abnormal; Notable for the following components:   SARS Coronavirus 2 Ag POSITIVE (*)    All other components within normal limits  CULTURE, BLOOD (ROUTINE X 2)  CULTURE, BLOOD (ROUTINE X 2)  MAGNESIUM  C-REACTIVE PROTEIN  D-DIMER, QUANTITATIVE (NOT AT [facility])  FERRITIN  HEPATITIS B SURFACE ANTIGEN  PROCALCITONIN  TSH  SODIUM, URINE, RANDOM  CREATININE, URINE, RANDOM  BRAIN NATRIURETIC PEPTIDE  URINALYSIS, ROUTINE W REFLEX MICROSCOPIC  CBC WITH DIFFERENTIAL/PLATELET  COMPREHENSIVE METABOLIC PANEL  C-REACTIVE PROTEIN  D-DIMER, QUANTITATIVE (NOT AT [facility])  FERRITIN  MAGNESIUM  PHOSPHORUS  ABO/RH  TROPONIN I (HIGH SENSITIVITY)  TROPONIN I (HIGH SENSITIVITY)  TROPONIN I (HIGH SENSITIVITY)  TROPONIN I (HIGH SENSITIVITY)    EKG EKG Interpretation  Date/Time:  Thursday April 04 2019 12:41:45 EST Ventricular Rate:  106 PR Interval:    QRS Duration: 109 QT Interval:  338 QTC Calculation: 357 R Axis:   6 Text Interpretation: Sinus tachycardia Ventricular bigeminy Incomplete left bundle branch block Hx of LBBB seen on prior No STEMI Confirmed by Octaviano Glow [phone]) on 04/04/2019 12:47:10 PM   Radiology DG Chest Port 1 View  Result Date: 04/04/2019 CLINICAL DATA:  73 year old who tested positive for COVID-19 today, complaining of intermittent fevers over the past 5 days. Bradycardia with heart rate in the 40s at urgent care earlier today. EXAM: PORTABLE CHEST 1 VIEW COMPARISON:  09/12/2016 and earlier. FINDINGS: Cardiomediastinal silhouette unremarkable and unchanged. Minimal scarring in the INFERIOR LEFT UPPER LOBE, unchanged.  Lungs otherwise clear. No localized airspace consolidation. No pleural effusions. No pneumothorax. Normal pulmonary vascularity. IMPRESSION: 1. No acute cardiopulmonary disease. 2. Stable scarring in the INFERIOR LEFT UPPER LOBE. Electronically Signed   By: Evangeline Dakin M.D.   On: 04/04/2019 14:08    Procedures Procedures (including critical care time)  Medications Ordered in ED Medications  potassium chloride 10 mEq in 100 mL IVPB (0 mEq Intravenous Stopped 04/04/19 1751)  0.9 % NaCl with KCl 20 mEq/ L  infusion ( Intravenous New Bag/Given 04/04/19 1721)  acetaminophen (TYLENOL) tablet 650 mg (has no administration in time range)  ondansetron (ZOFRAN) tablet 4 mg (has no administration in  time range)    Or  ondansetron (ZOFRAN) injection 4 mg (has no administration in time range)    ED Course  I have reviewed the triage vital signs and the nursing notes.  Pertinent labs & imaging results that were available during my care of the patient were reviewed by me and considered in my medical decision making (see chart for details).  73 year old female presenting from urgent care with Covid positive diagnosis, symptoms for 5 days, and also found incidentally to be bradycardic in urgent care with a heart rate in the 40s.  On arrival the patient has heart rate in the 90s appears to be in a ventricular bigeminy pattern.  She is not in A. fib and does not have symptoms of A. fib.  Her blood pressure stable.  She is mentating well.  She has a pulse ox of 94%, but has no other respiratory complaints.  She is febrile which is consistent with her Covid diagnosis.  I have no reason to suspect bacterial sepsis or urinary tract infection at this time.  It is possible that her arrhythmia developed as a result of electrolyte imbalance.  We will check her potassium magnesium.  Also check a CBC and evaluate her hemoglobin.  We will get a chest x-ray.  We will check a troponin to ensure this is not a cardiac  event.  This note was dictated using dragon dictation software.  Please be aware that there may be minor translation errors as a result of this oral dictation  KELISA MESMAN was evaluated in Emergency Department on 04/04/2019 for the symptoms described in the history of present illness. She was evaluated in the context of the global COVID-19 pandemic, which necessitated consideration that the patient might be at risk for infection with the SARS-CoV-2 virus that causes COVID-19. Institutional protocols and algorithms that pertain to the evaluation of patients at risk for COVID-19 are in a state of rapid change based on information released by regulatory bodies including the CDC and federal and state organizations. These policies and algorithms were followed during the patient's care in the ED.   Clinical Course as of Apr 03 1938  Thu Apr 04, 2019  1351 Patient's HR now in 40's, BP stable, mental status stable, will repeat ECG.  Ordered potassium.  Will need admission for AKI, hypoK in setting of Covid   [MT]  1410 The patient reverted again to HR 90's vent bigemy prior to my reassessment and 2nd ecg obtained   [MT]  1436 Cards consulted, will admit for AKI   [MT]  1515 Admitted to hospitalist   [MT]    Clinical Course User Index [MT] Jamella Grayer, Carola Rhine, MD   Final Clinical Impression(s) / ED Diagnoses Final diagnoses:  COVID-19  AKI (acute kidney injury) ([place])  Hypokalemia  Cardiac arrhythmia, unspecified cardiac arrhythmia type    Rx / DC Orders ED Discharge Orders    None       Wyvonnia Dusky, MD 04/04/19 1940

## 2019-04-05 ENCOUNTER — Inpatient Hospital Stay (HOSPITAL_COMMUNITY): Payer: Medicare Other

## 2019-04-05 DIAGNOSIS — I5042 Chronic combined systolic (congestive) and diastolic (congestive) heart failure: Secondary | ICD-10-CM

## 2019-04-05 DIAGNOSIS — N179 Acute kidney failure, unspecified: Secondary | ICD-10-CM

## 2019-04-05 DIAGNOSIS — N183 Chronic kidney disease, stage 3 unspecified: Secondary | ICD-10-CM

## 2019-04-05 DIAGNOSIS — I480 Paroxysmal atrial fibrillation: Secondary | ICD-10-CM

## 2019-04-05 DIAGNOSIS — J1289 Other viral pneumonia: Secondary | ICD-10-CM

## 2019-04-05 DIAGNOSIS — U071 COVID-19: Principal | ICD-10-CM

## 2019-04-05 DIAGNOSIS — J9601 Acute respiratory failure with hypoxia: Secondary | ICD-10-CM

## 2019-04-05 DIAGNOSIS — R001 Bradycardia, unspecified: Secondary | ICD-10-CM

## 2019-04-05 DIAGNOSIS — K219 Gastro-esophageal reflux disease without esophagitis: Secondary | ICD-10-CM

## 2019-04-05 HISTORY — PX: TRANSTHORACIC ECHOCARDIOGRAM: SHX275

## 2019-04-05 LAB — COMPREHENSIVE METABOLIC PANEL
ALT: 16 U/L (ref 0–44)
AST: 22 U/L (ref 15–41)
Albumin: 3.1 g/dL — ABNORMAL LOW (ref 3.5–5.0)
Alkaline Phosphatase: 72 U/L (ref 38–126)
Anion gap: 12 (ref 5–15)
BUN: 18 mg/dL (ref 8–23)
CO2: 26 mmol/L (ref 22–32)
Calcium: 8.4 mg/dL — ABNORMAL LOW (ref 8.9–10.3)
Chloride: 104 mmol/L (ref 98–111)
Creatinine, Ser: 2.03 mg/dL — ABNORMAL HIGH (ref 0.44–1.00)
GFR calc Af Amer: 27 mL/min — ABNORMAL LOW (ref >60)
GFR calc non Af Amer: 24 mL/min — ABNORMAL LOW (ref >60)
Glucose, Bld: 88 mg/dL (ref 70–99)
Potassium: 2.8 mmol/L — ABNORMAL LOW (ref 3.5–5.1)
Sodium: 142 mmol/L (ref 135–145)
Total Bilirubin: 0.3 mg/dL (ref 0.3–1.2)
Total Protein: 5.7 g/dL — ABNORMAL LOW (ref 6.5–8.1)

## 2019-04-05 LAB — CBC WITH DIFFERENTIAL/PLATELET
Abs Immature Granulocytes: 0.02 10*3/uL (ref 0.00–0.07)
Basophils Absolute: 0 10*3/uL (ref 0.0–0.1)
Basophils Relative: 0 %
Eosinophils Absolute: 0 10*3/uL (ref 0.0–0.5)
Eosinophils Relative: 0 %
HCT: 33.6 % — ABNORMAL LOW (ref 36.0–46.0)
Hemoglobin: 10.7 g/dL — ABNORMAL LOW (ref 12.0–15.0)
Immature Granulocytes: 0 %
Lymphocytes Relative: 19 %
Lymphs Abs: 0.8 10*3/uL (ref 0.7–4.0)
MCH: 29.2 pg (ref 26.0–34.0)
MCHC: 31.8 g/dL (ref 30.0–36.0)
MCV: 91.6 fL (ref 80.0–100.0)
Monocytes Absolute: 0.6 10*3/uL (ref 0.1–1.0)
Monocytes Relative: 13 %
Neutro Abs: 3.1 10*3/uL (ref 1.7–7.7)
Neutrophils Relative %: 68 %
Platelets: 182 10*3/uL (ref 150–400)
RBC: 3.67 MIL/uL — ABNORMAL LOW (ref 3.87–5.11)
RDW: 13.8 % (ref 11.5–15.5)
WBC: 4.5 10*3/uL (ref 4.0–10.5)
nRBC: 0 % (ref 0.0–0.2)

## 2019-04-05 LAB — BASIC METABOLIC PANEL
Anion gap: 9 (ref 5–15)
BUN: 17 mg/dL (ref 8–23)
CO2: 23 mmol/L (ref 22–32)
Calcium: 8.3 mg/dL — ABNORMAL LOW (ref 8.9–10.3)
Chloride: 106 mmol/L (ref 98–111)
Creatinine, Ser: 1.71 mg/dL — ABNORMAL HIGH (ref 0.44–1.00)
GFR calc Af Amer: 34 mL/min — ABNORMAL LOW (ref >60)
GFR calc non Af Amer: 29 mL/min — ABNORMAL LOW (ref >60)
Glucose, Bld: 135 mg/dL — ABNORMAL HIGH (ref 70–99)
Potassium: 4.7 mmol/L (ref 3.5–5.1)
Sodium: 138 mmol/L (ref 135–145)

## 2019-04-05 LAB — D-DIMER, QUANTITATIVE: D-Dimer, Quant: 0.28 ug/mL-FEU (ref 0.00–0.50)

## 2019-04-05 LAB — BRAIN NATRIURETIC PEPTIDE: B Natriuretic Peptide: 155.2 pg/mL — ABNORMAL HIGH (ref 0.0–100.0)

## 2019-04-05 LAB — MAGNESIUM: Magnesium: 2 mg/dL (ref 1.7–2.4)

## 2019-04-05 LAB — ECHOCARDIOGRAM LIMITED
Height: 67 in
Weight: 4000 oz

## 2019-04-05 LAB — C-REACTIVE PROTEIN: CRP: 0.6 mg/dL (ref <1.0)

## 2019-04-05 LAB — PHOSPHORUS: Phosphorus: 3.4 mg/dL (ref 2.5–4.6)

## 2019-04-05 LAB — CREATININE, URINE, RANDOM: Creatinine, Urine: 71.46 mg/dL

## 2019-04-05 LAB — FERRITIN: Ferritin: 144 ng/mL (ref 11–307)

## 2019-04-05 LAB — TROPONIN I (HIGH SENSITIVITY): Troponin I (High Sensitivity): 9 ng/L (ref <18)

## 2019-04-05 LAB — SODIUM, URINE, RANDOM: Sodium, Ur: 11 mmol/L

## 2019-04-05 MED ORDER — GABAPENTIN 300 MG PO CAPS
300.0000 mg | ORAL_CAPSULE | Freq: Two times a day (BID) | ORAL | Status: DC
  Administered 2019-04-05 – 2019-04-10 (×11): 300 mg via ORAL
  Filled 2019-04-05 (×11): qty 1

## 2019-04-05 MED ORDER — FLUTICASONE PROPIONATE 50 MCG/ACT NA SUSP
1.0000 | Freq: Every day | NASAL | Status: DC
  Administered 2019-04-06 – 2019-04-10 (×5): 1 via NASAL
  Filled 2019-04-05 (×3): qty 16

## 2019-04-05 MED ORDER — APIXABAN 5 MG PO TABS
5.0000 mg | ORAL_TABLET | Freq: Two times a day (BID) | ORAL | Status: DC
  Administered 2019-04-05 – 2019-04-10 (×11): 5 mg via ORAL
  Filled 2019-04-05 (×11): qty 1

## 2019-04-05 MED ORDER — LEVOTHYROXINE SODIUM 50 MCG PO TABS
50.0000 ug | ORAL_TABLET | Freq: Every day | ORAL | Status: DC
  Administered 2019-04-05 – 2019-04-10 (×6): 50 ug via ORAL
  Filled 2019-04-05 (×6): qty 1

## 2019-04-05 MED ORDER — TEMAZEPAM 30 MG PO CAPS
30.0000 mg | ORAL_CAPSULE | Freq: Two times a day (BID) | ORAL | Status: DC | PRN
  Administered 2019-04-05: 30 mg via ORAL
  Filled 2019-04-05: qty 2

## 2019-04-05 MED ORDER — DOCUSATE SODIUM 100 MG PO CAPS
100.0000 mg | ORAL_CAPSULE | Freq: Two times a day (BID) | ORAL | Status: DC
  Administered 2019-04-05 – 2019-04-10 (×11): 100 mg via ORAL
  Filled 2019-04-05 (×11): qty 1

## 2019-04-05 MED ORDER — FERROUS SULFATE 325 (65 FE) MG PO TABS
325.0000 mg | ORAL_TABLET | Freq: Two times a day (BID) | ORAL | Status: DC
  Administered 2019-04-05 – 2019-04-10 (×10): 325 mg via ORAL
  Filled 2019-04-05 (×10): qty 1

## 2019-04-05 MED ORDER — ROSUVASTATIN CALCIUM 20 MG PO TABS
40.0000 mg | ORAL_TABLET | Freq: Every day | ORAL | Status: DC
  Administered 2019-04-05 – 2019-04-09 (×5): 40 mg via ORAL
  Filled 2019-04-05 (×7): qty 2

## 2019-04-05 MED ORDER — DULOXETINE HCL 30 MG PO CPEP
30.0000 mg | ORAL_CAPSULE | Freq: Two times a day (BID) | ORAL | Status: DC
  Administered 2019-04-05 – 2019-04-10 (×11): 30 mg via ORAL
  Filled 2019-04-05 (×11): qty 1

## 2019-04-05 MED ORDER — BUPROPION HCL ER (XL) 150 MG PO TB24
150.0000 mg | ORAL_TABLET | Freq: Every day | ORAL | Status: DC
  Administered 2019-04-05 – 2019-04-10 (×6): 150 mg via ORAL
  Filled 2019-04-05 (×6): qty 1

## 2019-04-05 MED ORDER — POTASSIUM CHLORIDE CRYS ER 20 MEQ PO TBCR
40.0000 meq | EXTENDED_RELEASE_TABLET | ORAL | Status: AC
  Administered 2019-04-05 (×2): 40 meq via ORAL
  Filled 2019-04-05 (×2): qty 2

## 2019-04-05 MED ORDER — VITAMIN D 25 MCG (1000 UNIT) PO TABS
4000.0000 [IU] | ORAL_TABLET | Freq: Every day | ORAL | Status: DC
  Administered 2019-04-05 – 2019-04-10 (×6): 4000 [IU] via ORAL
  Filled 2019-04-05 (×6): qty 4

## 2019-04-05 MED ORDER — PANTOPRAZOLE SODIUM 40 MG PO TBEC
40.0000 mg | DELAYED_RELEASE_TABLET | Freq: Every day | ORAL | Status: DC
  Administered 2019-04-05 – 2019-04-10 (×6): 40 mg via ORAL
  Filled 2019-04-05 (×6): qty 1

## 2019-04-05 MED ORDER — COQ10 100 MG PO CAPS: 300.0000 mg | ORAL_CAPSULE | Freq: Every day | ORAL | Status: DC

## 2019-04-05 NOTE — ED Notes (Signed)
Bag of belongings pick up from main entrance and given to pt

## 2019-04-05 NOTE — Progress Notes (Addendum)
PROGRESS NOTE    [name] [name]  [id number] DOB: [birth year] DOA: 04/04/2019 PCP: Unk Pinto, MD    Brief Narrative:  73 year old female who presented with bradycardia.  She does have significant past medical history for hypertension, dyslipidemia, prediabetes, stage III chronic kidney disease, paroxysmal atrial fibrillation, diastolic heart failure, depression and anxiety.  She tested positive for COVID-19 December 24, her symptoms consisted with 5 days of fever, cough, congestion, myalgias, fatigue, weakness, and decreased appetite.  December 24 she went to urgent care, her heart rate was in the 40s and she tested positive for COVID-19.  She was referred to the hospital for further evaluation. On her initial physical examination her temperature was 102.4 F, heart rate 50, sure 108/58, respiratory rate 12, oxygen saturation 93%, her lungs were clear to auscultation, heart S1-S2, present, regular, bradycardic, abdomen soft, no lower extremity edema. Sodium 138, potassium 2.9, chloride 102, bicarb 23, glucose 104, BUN 18, creatinine 2.9, white count 6.2, hemoglobin 11.6, hematocrit 36.4, platelets 245 SARS COVID-19 positive.  Chest radiograph, personally reviewed, interstitial infiltrate left upper lobe and left lower lobe.  EKG 106 bpm, left axis deviation, left bundle branch block, sinus rhythm, no ST segment changes, inferior lateral negative T waves, positive PVCs in a bigeminy pattern.   Patient was admitted to the hospital with a working diagnosis of acute hypoxic respiratory failure due to SARS COVID-19 viral pneumonia, complicated by bradycardia and hypokalemia  Assessment & Plan:   Principal Problem:   Pneumonia due to COVID-19 virus Active Problems:   Hyperlipidemia   Hypertension   GERD   Paroxysmal atrial fibrillation ([place]): CHA2DS2-VASc Score - starting Eliquis   Chronic combined systolic (congestive) and diastolic (congestive) heart failure (HCC)   Morbid obesity  (HCC)   CKD (chronic kidney disease) stage 3, GFR 30-59 ml/min   Bradycardia   Depression   Hypokalemia   AKI (acute kidney injury) ([place])   COVID-19 virus infection   Acute respiratory failure with hypoxia ([place])   1.  Acute hypoxic respiratory failure due to SARS COVID-19 viral pneumonia.  RR: 14  Pulse oxymetry: 94%  Fi02: 21% room air.  COVID-19 Labs  Recent Labs    04/04/19 1715 04/05/19 0500  DDIMER 0.31 0.28  FERRITIN 160 144  LDH 248*  --   CRP <0.5 0.6    No results found for: SARSCOV2NAA  Inflammatory markers are trending down.   Will continue medical therapy with Remdesivir and systemic steroids with dexamethasone 6 mg IV q 24 H. Will continue antitussive agents, and airway clearing techniques with flutter valve and incentive spirometer.   2. Sinus bradycardia with bigeminy. HR this am is up to 86 bpm, blood pressure 134/49. Will continue to target a K of 4 and Mg of 2. Continue telemetry monitoring.   3. AKI with hypokalemia. Renal function with serum cr at 2,03 with K at 2,8 and serum bicarbonate at 26. Will continue K correction with oral Kcl 40 meq x2 and follow up on renal function this pm. Mg at target of 2. Continue gentle hydration with balanced electrolyte solutions.   4. Diastolic heart failure EF 45 to 50%, Clinically euvolemic, will continue to hold on furosemide, continue gentle hydration with IV fluids, continue blood pressure monitoring. Hold on carvedilol and losartan for now.   5. Dyslipidemia. Continue with statin therapy, rosuvastatin.   6. GERD. Continue pantoprazole for now.   7. Paroxysmal atrial fibrillation. Will continue telemetry monitoring, will continue anticoagulation with apixaban. Continue to hold on  AV blockers.   8. Depression. Continue with bupropion, duloxetine, and temazepam.  9. Iron deficiency anemia. Continue with iron supplementation.   10. Hypothyroid. Will continue with levothyroxine.   DVT prophylaxis: enoxaparin     Code Status:  full Family Communication: no family at the bedside  Disposition Plan/ discharge barriers: pending clinical improvement.     Subjective: Patient is feeling better, but continue to have dyspnea and generalized malaise, poor oral intake. No chest pain, no nausea or vomiting.   Objective: Vitals:   04/05/19 0430 04/05/19 0500 04/05/19 0611 04/05/19 0630  BP: (!) 114/37 (!) 118/55 (!) 151/62 (!) 156/46  Pulse: 84 (!) 42 78 65  Resp: 12 20 12 12   Temp:      TempSrc:      SpO2: 96% 96% 92% 92%  Weight:      Height:       No intake or output data in the 24 hours ending 04/05/19 0815 Filed Weights   04/04/19 1338  Weight: 113.4 kg    Examination:   General: Not in pain or dyspnea, deconditioned  Neurology: Awake and alert, non focal  E ENT:  Mild pallor, no icterus, oral mucosa moist Cardiovascular: No JVD. S1-S2 present, rhythmic, no gallops, rubs, or murmurs. No lower extremity edema. Pulmonary: positive breath sounds bilaterally. Gastrointestinal. Abdomen with no organomegaly, non tender, no rebound or guarding Skin. No rashes Musculoskeletal: no joint deformities     Data Reviewed: I have personally reviewed following labs and imaging studies  CBC: Recent Labs  Lab 04/04/19 1251 04/04/19 1715 04/05/19 0500  WBC 6.2 5.7 4.5  NEUTROABS 4.9 4.0 3.1  HGB 11.6* 10.9* 10.7*  HCT 36.4 34.6* 33.6*  MCV 90.1 90.6 91.6  PLT 245 205 [id number]   Basic Metabolic Panel: Recent Labs  Lab 04/04/19 1251 04/04/19 1715 04/05/19 0500  NA 138 138 142  K 2.9* 3.4* 2.8*  CL 102 103 104  CO2 23 24 26   GLUCOSE 104* 83 88  BUN 18 20 18   CREATININE 2.19* 2.17* 2.03*  CALCIUM 8.5* 8.2* 8.4*  MG 2.0  --  2.0  PHOS  --   --  3.4   GFR: Estimated Creatinine Clearance: 32.1 mL/min (A) (by C-G formula based on SCr of 2.03 mg/dL (H)). Liver Function Tests: Recent Labs  Lab 04/04/19 1715 04/05/19 0500  AST 22 22  ALT 18 16  ALKPHOS 78 72  BILITOT 0.5 0.3  PROT  6.1* 5.7*  ALBUMIN 3.4* 3.1*   No results for input(s): LIPASE, AMYLASE in the last 168 hours. No results for input(s): AMMONIA in the last 168 hours. Coagulation Profile: No results for input(s): INR, PROTIME in the last 168 hours. Cardiac Enzymes: No results for input(s): CKTOTAL, CKMB, CKMBINDEX, TROPONINI in the last 168 hours. BNP (last 3 results) No results for input(s): PROBNP in the last 8760 hours. HbA1C: No results for input(s): HGBA1C in the last 72 hours. CBG: No results for input(s): GLUCAP in the last 168 hours. Lipid Profile: Recent Labs    04/04/19 1715  CHOL 119  HDL 38*  LDLCALC 48  TRIG 163*  CHOLHDL 3.1   Thyroid Function Tests: Recent Labs    04/04/19 1715  TSH 1.153   Anemia Panel: Recent Labs    04/04/19 1715 04/05/19 0500  FERRITIN 160 144      Radiology Studies: I have reviewed all of the imaging during this hospital visit personally     Scheduled Meds: Continuous Infusions: . 0.9 %  NaCl with KCl 20 mEq / L 75 mL/hr at 04/05/19 0614  . potassium chloride Stopped (04/04/19 1751)     LOS: 1 day        Katiejo Gilroy Gerome Apley, MD

## 2019-04-05 NOTE — ED Notes (Signed)
ECHO at bedside.

## 2019-04-05 NOTE — ED Notes (Signed)
Echo called will come to the ED.

## 2019-04-05 NOTE — ED Notes (Signed)
Pt given ice water.

## 2019-04-05 NOTE — Progress Notes (Signed)
   Progress Note  Patient Name: [name] [name] Date of Encounter: 04/05/2019  Primary Cardiologist: Glenetta Hew, MD   Chart and telemetry reviewed.  Pt with history of nonischemic cardiomyopathy and paroxysmal atrial fibrillation admitted with viral syndrome and tested positive for Covid. Cardiology was asked to evaluate for PVCs.  I have reviewed the patient's telemetry.  She continues to have frequent PVCs unchanged compared to admission.  There is no history of syncope.  Would continue to supplement potassium and keep greater than 4.  She has acute renal insufficiency likely secondary to dehydration.  Would continue to hold ARB and Lasix for now.  Resume home dose of carvedilol when blood pressure allows.  Continue therapy for Covid.  As she improves would resume ARB if renal function also improved.  Would also need to resume Lasix at lower dose likely 20 mg daily.  Following discharge and recovery from Covid would arrange outpatient follow-up with Dr. Ellyn Hack.  At that time would consider 48-hour Holter monitor to assess PVC burden as this may be the cause of her cardiomyopathy.  If significant could add antiarrhythmic in the future. Kirk Ruths   For questions or updates, please contact [facility] Please consult [email or web address] for contact info under        Signed, Kirk Ruths, MD  04/05/2019, 5:56 AM

## 2019-04-05 NOTE — Progress Notes (Signed)
  Echocardiogram 2D Echocardiogram has been performed.  Jennette Dubin 04/05/2019, 9:41 AM

## 2019-04-05 NOTE — ED Notes (Signed)
Lunch Tray Ordered @ 1033. 

## 2019-04-06 LAB — COMPREHENSIVE METABOLIC PANEL
ALT: 15 U/L (ref 0–44)
AST: 18 U/L (ref 15–41)
Albumin: 2.9 g/dL — ABNORMAL LOW (ref 3.5–5.0)
Alkaline Phosphatase: 63 U/L (ref 38–126)
Anion gap: 7 (ref 5–15)
BUN: 14 mg/dL (ref 8–23)
CO2: 25 mmol/L (ref 22–32)
Calcium: 8.5 mg/dL — ABNORMAL LOW (ref 8.9–10.3)
Chloride: 107 mmol/L (ref 98–111)
Creatinine, Ser: 1.57 mg/dL — ABNORMAL HIGH (ref 0.44–1.00)
GFR calc Af Amer: 38 mL/min — ABNORMAL LOW (ref >60)
GFR calc non Af Amer: 32 mL/min — ABNORMAL LOW (ref >60)
Glucose, Bld: 109 mg/dL — ABNORMAL HIGH (ref 70–99)
Potassium: 3.2 mmol/L — ABNORMAL LOW (ref 3.5–5.1)
Sodium: 139 mmol/L (ref 135–145)
Total Bilirubin: 0.7 mg/dL (ref 0.3–1.2)
Total Protein: 5.6 g/dL — ABNORMAL LOW (ref 6.5–8.1)

## 2019-04-06 LAB — C-REACTIVE PROTEIN: CRP: 0.6 mg/dL (ref <1.0)

## 2019-04-06 LAB — FERRITIN: Ferritin: 148 ng/mL (ref 11–307)

## 2019-04-06 LAB — CBG MONITORING, ED: Glucose-Capillary: 141 mg/dL — ABNORMAL HIGH (ref 70–99)

## 2019-04-06 LAB — D-DIMER, QUANTITATIVE: D-Dimer, Quant: 0.33 ug/mL-FEU (ref 0.00–0.50)

## 2019-04-06 MED ORDER — SODIUM CHLORIDE 0.9 % IV SOLN
200.0000 mg | Freq: Once | INTRAVENOUS | Status: AC
  Administered 2019-04-06: 13:00:00 200 mg via INTRAVENOUS
  Filled 2019-04-06: qty 40

## 2019-04-06 MED ORDER — DEXAMETHASONE SODIUM PHOSPHATE 10 MG/ML IJ SOLN
6.0000 mg | INTRAMUSCULAR | Status: DC
  Administered 2019-04-06 – 2019-04-10 (×5): 6 mg via INTRAVENOUS
  Filled 2019-04-06 (×6): qty 1

## 2019-04-06 MED ORDER — POTASSIUM CHLORIDE CRYS ER 20 MEQ PO TBCR
40.0000 meq | EXTENDED_RELEASE_TABLET | Freq: Once | ORAL | Status: AC
  Administered 2019-04-06: 40 meq via ORAL
  Filled 2019-04-06: qty 2

## 2019-04-06 MED ORDER — SODIUM CHLORIDE 0.9 % IV SOLN
100.0000 mg | Freq: Every day | INTRAVENOUS | Status: DC
  Administered 2019-04-07 – 2019-04-09 (×3): 100 mg via INTRAVENOUS
  Filled 2019-04-06 (×5): qty 20

## 2019-04-06 MED ORDER — POTASSIUM CHLORIDE CRYS ER 20 MEQ PO TBCR: 40.0000 meq | EXTENDED_RELEASE_TABLET | Freq: Once | ORAL | Status: DC

## 2019-04-06 NOTE — Progress Notes (Addendum)
PROGRESS NOTE    [name] [name]  [id number] DOB: 1946/01/14 DOA: 04/04/2019 PCP: Unk Pinto, MD    Brief Narrative:  73 year old female who presented with bradycardia.  She does have significant past medical history for hypertension, dyslipidemia, prediabetes, stage III chronic kidney disease, paroxysmal atrial fibrillation, diastolic heart failure, depression and anxiety.  She tested positive for COVID-19 December 24, her symptoms consisted with 5 days of fever, cough, congestion, myalgias, fatigue, weakness, and decreased appetite.  December 24 she went to urgent care, her heart rate was in the 40s and she tested positive for COVID-19.  She was referred to the hospital for further evaluation. On her initial physical examination her temperature was 102.4 F, heart rate 50, sure 108/58, respiratory rate 12, oxygen saturation 93%, her lungs were clear to auscultation, heart S1-S2, present, regular, bradycardic, abdomen soft, no lower extremity edema. Sodium 138, potassium 2.9, chloride 102, bicarb 23, glucose 104, BUN 18, creatinine 2.9, white count 6.2, hemoglobin 11.6, hematocrit 36.4, platelets 245 SARS COVID-19 positive.  Chest radiograph, personally reviewed, interstitial infiltrate left upper lobe and left lower lobe.  EKG 106 bpm, left axis deviation, left bundle branch block, sinus rhythm, no ST segment changes, inferior lateral negative T waves, positive PVCs in a bigeminy pattern.   Patient was admitted to the hospital with a working diagnosis of acute hypoxic respiratory failure due to SARS COVID-19 viral pneumonia, complicated by bradycardia and hypokalemia  Heart rate has improved and electrolytes have been corrected. Tolerating well medical therapy with Remdesivir and dexamethasone.   Assessment & Plan:   Principal Problem:   Pneumonia due to COVID-19 virus Active Problems:   Hyperlipidemia   Hypertension   GERD   Paroxysmal atrial fibrillation ([place]):  CHA2DS2-VASc Score - starting Eliquis   Chronic combined systolic (congestive) and diastolic (congestive) heart failure (HCC)   Morbid obesity (HCC)   CKD (chronic kidney disease) stage 3, GFR 30-59 ml/min   Bradycardia   Depression   Hypokalemia   AKI (acute kidney injury) ([place])   COVID-19 virus infection   Acute respiratory failure with hypoxia ([place])   1.  Acute hypoxic respiratory failure due to SARS COVID-19 viral pneumonia.  RR: 17  Pulse oxymetry: 96%  Fi02: 21% room air.   COVID-19 Labs  Recent Labs    04/04/19 1715 04/05/19 0500 04/06/19 0500  DDIMER 0.31 0.28 0.33  FERRITIN 160 144 148  LDH 248*  --   --   CRP <0.5 0.6 0.6    No results found for: SARSCOV2NAA   Inflammatory markers continue to trend down.   Tolerating well medical therapy with Remdesivir #1/5 and systemic steroids with dexamethasone 6 mg IV q 24 H. On antitussive agents, and airway clearing techniques with flutter valve and incentive spirometer.   Out of bed to chair tid with meals, physical therapy evaluation.   2. Sinus bradycardia with bigeminy. HR continue to be stable with HR 89 to 92. Out of bed with physical therapy, continue telemetry monitoring. Keep K at 4 and Mg at 2.    3. AKI with hypokalemia. Serum cr today with serum cr at 1,57, K down to  3,2 and serum bicarbonate at 25. Aggressive K correction with oral Kcl 40 meq x2. Will hold on IV fluids, follow on renal function in am. Patient is tolerating po well.   4. Diastolic heart failure EF 45 to 50%/ HTN, Continue to be stable with no signs of exacerbation, will continue to hold on furosemide will discontinue IV  fluids, Continue to hold on carvedilol and losartan.   5. Dyslipidemia. On rosuvastatin.   6. GERD. On pantoprazole.   7. Paroxysmal atrial fibrillation. Continue sinus rhythm will continue to hold on AV blockade due to bradycardia, will continue anticoagulation with apixaban.    8. Depression. On bupropion,  duloxetine, and temazepam with good toleration.   9. Iron deficiency anemia. On iron supplementation.   10. Hypothyroid. On levothyroxine.   11. Obesity. Calculated BMI 39,1.   DVT prophylaxis: enoxaparin   Code Status:  full Family Communication: I spoke over the phone with the patient's son about patient's  condition, plan of care and all questions were addressed.  Disposition Plan/ discharge barriers: pending clinical improvement.    Subjective: Patient continue to have cough and fatigue, poor appetite, not back to baseline, no nausea or vomiting, no chest pain. Positive dyspnea on exertion.   Objective: Vitals:   04/06/19 0400 04/06/19 0500 04/06/19 0600 04/06/19 0929  BP: (!) 126/49 125/62 (!) 125/46 138/64  Pulse: (!) 30 66 88 66  Resp: 17 13 17    Temp:    99.8 F (37.7 C)  TempSrc:    Oral  SpO2: 93% 97% 94% 96%  Weight:      Height:       No intake or output data in the 24 hours ending 04/06/19 1039 Filed Weights   04/04/19 1338  Weight: 113.4 kg    Examination:   General: Not in pain or dyspnea, deconditioned  Neurology: Awake and alert, non focal  E ENT: no pallor, no icterus, oral mucosa moist Cardiovascular: No JVD. S1-S2 present, rhythmic. No lower extremity edema. Pulmonary: positive breath sounds bilaterally. Gastrointestinal. Abdomen with no organomegaly, non tender, no rebound or guarding Skin. No rashes Musculoskeletal: no joint deformities     Data Reviewed: I have personally reviewed following labs and imaging studies  CBC: Recent Labs  Lab 04/04/19 1251 04/04/19 1715 04/05/19 0500  WBC 6.2 5.7 4.5  NEUTROABS 4.9 4.0 3.1  HGB 11.6* 10.9* 10.7*  HCT 36.4 34.6* 33.6*  MCV 90.1 90.6 91.6  PLT 245 205 [id number]   Basic Metabolic Panel: Recent Labs  Lab 04/04/19 1251 04/04/19 1715 04/05/19 0500 04/05/19 1347 04/06/19 0500  NA 138 138 142 138 139  K 2.9* 3.4* 2.8* 4.7 3.2*  CL 102 103 104 106 107  CO2 23 24 26 23 25   GLUCOSE  104* 83 88 135* 109*  BUN 18 20 18 17 14   CREATININE 2.19* 2.17* 2.03* 1.71* 1.57*  CALCIUM 8.5* 8.2* 8.4* 8.3* 8.5*  MG 2.0  --  2.0  --   --   PHOS  --   --  3.4  --   --    GFR: Estimated Creatinine Clearance: 41.5 mL/min (A) (by C-G formula based on SCr of 1.57 mg/dL (H)). Liver Function Tests: Recent Labs  Lab 04/04/19 1715 04/05/19 0500 04/06/19 0500  AST 22 22 18   ALT 18 16 15   ALKPHOS 78 72 63  BILITOT 0.5 0.3 0.7  PROT 6.1* 5.7* 5.6*  ALBUMIN 3.4* 3.1* 2.9*   No results for input(s): LIPASE, AMYLASE in the last 168 hours. No results for input(s): AMMONIA in the last 168 hours. Coagulation Profile: No results for input(s): INR, PROTIME in the last 168 hours. Cardiac Enzymes: No results for input(s): CKTOTAL, CKMB, CKMBINDEX, TROPONINI in the last 168 hours. BNP (last 3 results) No results for input(s): PROBNP in the last 8760 hours. HbA1C: No results for input(s): HGBA1C in  the last 72 hours. CBG: No results for input(s): GLUCAP in the last 168 hours. Lipid Profile: Recent Labs    04/04/19 1715  CHOL 119  HDL 38*  LDLCALC 48  TRIG 163*  CHOLHDL 3.1   Thyroid Function Tests: Recent Labs    04/04/19 1715  TSH 1.153   Anemia Panel: Recent Labs    04/05/19 0500 04/06/19 0500  FERRITIN 144 148      Radiology Studies: I have reviewed all of the imaging during this hospital visit personally     Scheduled Meds: . apixaban  5 mg Oral BID  . buPROPion  150 mg Oral Daily  . cholecalciferol  4,000 Units Oral Daily  . docusate sodium  100 mg Oral BID  . DULoxetine  30 mg Oral BID  . ferrous sulfate  325 mg Oral BID WC  . fluticasone  1 spray Each Nare Daily  . gabapentin  300 mg Oral BID  . levothyroxine  50 mcg Oral Q0600  . pantoprazole  40 mg Oral Daily  . rosuvastatin  40 mg Oral QHS   Continuous Infusions:   LOS: 2 days        Ankith Ed[place]ston Gerome Apley, MD

## 2019-04-06 NOTE — ED Notes (Signed)
+  tele  Breakfast ordered 

## 2019-04-06 NOTE — ED Notes (Signed)
Assisted patient to and from bedside toilet. She had an output of 600 mls

## 2019-04-06 NOTE — ED Notes (Signed)
Took patient some fresh ice water.

## 2019-04-06 NOTE — ED Notes (Signed)
Pt is A-fib w/BBB w/PVCs on monitor

## 2019-04-06 NOTE — ED Notes (Signed)
Pt complaining of nausea upon waking. Zofran given.

## 2019-04-06 NOTE — ED Notes (Signed)
Just checked patients vitals and gave her some fresh ice water, wash cloths and some pericare cleanser

## 2019-04-07 LAB — COMPREHENSIVE METABOLIC PANEL
ALT: 15 U/L (ref 0–44)
AST: 19 U/L (ref 15–41)
Albumin: 3.1 g/dL — ABNORMAL LOW (ref 3.5–5.0)
Alkaline Phosphatase: 69 U/L (ref 38–126)
Anion gap: 7 (ref 5–15)
BUN: 14 mg/dL (ref 8–23)
CO2: 24 mmol/L (ref 22–32)
Calcium: 9.1 mg/dL (ref 8.9–10.3)
Chloride: 110 mmol/L (ref 98–111)
Creatinine, Ser: 1.32 mg/dL — ABNORMAL HIGH (ref 0.44–1.00)
GFR calc Af Amer: 46 mL/min — ABNORMAL LOW (ref >60)
GFR calc non Af Amer: 40 mL/min — ABNORMAL LOW (ref >60)
Glucose, Bld: 114 mg/dL — ABNORMAL HIGH (ref 70–99)
Potassium: 4.1 mmol/L (ref 3.5–5.1)
Sodium: 141 mmol/L (ref 135–145)
Total Bilirubin: 0.1 mg/dL — ABNORMAL LOW (ref 0.3–1.2)
Total Protein: 6 g/dL — ABNORMAL LOW (ref 6.5–8.1)

## 2019-04-07 LAB — MAGNESIUM: Magnesium: 1.9 mg/dL (ref 1.7–2.4)

## 2019-04-07 LAB — D-DIMER, QUANTITATIVE: D-Dimer, Quant: 0.39 ug/mL-FEU (ref 0.00–0.50)

## 2019-04-07 LAB — C-REACTIVE PROTEIN: CRP: 0.5 mg/dL (ref <1.0)

## 2019-04-07 LAB — FERRITIN: Ferritin: 170 ng/mL (ref 11–307)

## 2019-04-07 MED ORDER — TEMAZEPAM 15 MG PO CAPS
30.0000 mg | ORAL_CAPSULE | Freq: Every evening | ORAL | Status: DC | PRN
  Administered 2019-04-07 – 2019-04-09 (×3): 30 mg via ORAL
  Filled 2019-04-07 (×3): qty 2

## 2019-04-07 NOTE — Evaluation (Addendum)
Physical Therapy Evaluation Patient Details Name: [name] [name] MRN: [id number] DOB: October 23, 1945 Today's Date: 04/07/2019   History of Present Illness  73 y.o. female admitted on 04/04/19 for fever, cough, congestion, weakness.  Dx with COVID 19, bradycardia in the 40s.  Other significant dx include acute hypoxic respiratory failure due to COVID 19 viral PNA, sinus brady, cardiology consulted no intervention recommended, AKI with hypokalemia.  Pt with significant PMH of PAF, radiation therapy due to L breast CA, HTN, non ischemic cardiomyopathy, chronic back pain s/p lumbar fusion, anemia, L TKA, bil hand tendon issues s/p surgery.   Clinical Impression  Pt is weak and mildly unsteady on her feet.  She stands at work (part time-5 days per week in the [employer]).  She is reaching for objects to stabilize on during gait with increased fatigue and mild DOE 2/4 requiring two standing rest breaks during short distance hallway gait.  Energy conservation education initiated (no handout) and HEP give with T-band and reviewed.  She would benefit from some follow up HHPT as her flight of stairs going up to her bedroom are going to be quite challenging.   PT to follow acutely for deficits listed below.  Ambulatory sats tomorrow to ensure she is not dropping during hallway/longer distance gait.    [employer] HEP: [id number]  Follow Up Recommendations Home health PT    Equipment Recommendations  None recommended by PT    Recommendations for Other Services    NA  Precautions / Restrictions Precautions Precautions: Other (comment) Precaution Comments: monitor vitals--bring finger pulse ox as she is no longer on telemetry      Mobility  Bed Mobility Overal bed mobility: Modified Independent                Transfers Overall transfer level: Needs assistance   Transfers: Sit to/from Stand Sit to Stand: Min guard         General transfer comment: Min guard assist for safety  and balance, educated pt on standing for a few extra seconds to assess how she feels before walking.   Ambulation/Gait Ambulation/Gait assistance: Min guard Gait Distance (Feet): 80 Feet Assistive device: 1 person hand held assist(and hallway railing) Gait Pattern/deviations: Step-through pattern;Staggering right;Staggering left Gait velocity: decreased Gait velocity interpretation: 1.31 - 2.62 ft/sec, indicative of limited community ambulator General Gait Details: Pt with staggering gait pattern, reaching for support with her free hand in the hallway, having to stop ~2 times for standing rest due to fatigue and mild SOB (no O2 monitor readily available to check).          Balance Overall balance assessment: Needs assistance Sitting-balance support: Feet supported;No upper extremity supported Sitting balance-Leahy Scale: Good Sitting balance - Comments: able to donn socks figure 4 style in sitting EOB.    Standing balance support: Single extremity supported Standing balance-Leahy Scale: Fair                               Pertinent Vitals/Pain Pain Assessment: Faces Faces Pain Scale: Hurts little more Pain Location: head Pain Descriptors / Indicators: Aching Pain Intervention(s): Limited activity within patient's tolerance;Monitored during session;Premedicated before session;Repositioned;Heat applied    Home Living Family/patient expects to be discharged to:: Private residence Living Arrangements: Alone Available Help at Discharge: Friend(s);Available PRN/intermittently(neighbors watching her dog) Type of Home: Other(Comment)(condo) Home Access: Level entry     Home Layout: Two level Home Equipment: Walker - 2 wheels;Cane -  single point      Prior Function Level of Independence: Independent         Comments: works part time in [employer] at [employer].  4 of her co-workers have [place].      Hand Dominance        Extremity/Trunk  Assessment   Upper Extremity Assessment Upper Extremity Assessment: Defer to OT evaluation    Lower Extremity Assessment Lower Extremity Assessment: Generalized weakness    Cervical / Trunk Assessment Cervical / Trunk Assessment: Other exceptions Cervical / Trunk Exceptions: h/o lumbar fusion  Communication      Cognition Arousal/Alertness: Awake/alert Behavior During Therapy: WFL for tasks assessed/performed Overall Cognitive Status: Impaired/Different from baseline Area of Impairment: Problem solving                             Problem Solving: Slow processing General Comments: Seems to have some difficulty with processing and retrieval at times.        General Comments General comments (skin integrity, edema, etc.): Started education on energy conservation strategies, but no handout given.  I did provide her with the seated UE and LE exercise program with t-band.     Exercises General Exercises - Upper Extremity Shoulder ABduction: AROM;Both;10 reps;Seated;Theraband Theraband Level (Shoulder Abduction): Level 2 (Red) Shoulder Horizontal ABduction: AROM;Both;10 reps;Seated;Theraband Theraband Level (Shoulder Horizontal Abduction): Level 2 (Red) Elbow Flexion: AROM;Both;10 reps;Seated;Theraband Theraband Level (Elbow Flexion): Level 2 (Red) Elbow Extension: AROM;Both;10 reps;Seated;Theraband Theraband Level (Elbow Extension): Level 2 (Red) General Exercises - Lower Extremity Long Arc Quad: AROM;Both;10 reps;Seated Hip ABduction/ADduction: AROM;Both;10 reps;Supine Straight Leg Raises: AROM;Both;10 reps;Supine Hip Flexion/Marching: AROM;Both;10 reps;Seated   Assessment/Plan    PT Assessment Patient needs continued PT services  PT Problem List Decreased strength;Decreased activity tolerance;Decreased mobility;Decreased balance;Decreased knowledge of use of DME;Decreased knowledge of precautions;Cardiopulmonary status limiting activity       PT Treatment  Interventions DME instruction;Gait training;Stair training;Functional mobility training;Therapeutic activities;Therapeutic exercise;Balance training;Patient/family education    PT Goals (Current goals can be found in the Care Plan section)  Acute Rehab PT Goals Patient Stated Goal: to feel safe going home, get a bit stronger before she leaves the hospital because she says people are afraid to help her.  PT Goal Formulation: With patient Time For Goal Achievement: 04/21/19 Potential to Achieve Goals: Good    Frequency Min 3X/week        AM-PAC PT "6 Clicks" Mobility  Outcome Measure Help needed turning from your back to your side while in a flat bed without using bedrails?: None Help needed moving from lying on your back to sitting on the side of a flat bed without using bedrails?: None Help needed moving to and from a bed to a chair (including a wheelchair)?: A Little Help needed standing up from a chair using your arms (e.g., wheelchair or bedside chair)?: A Little Help needed to walk in hospital room?: A Little Help needed climbing 3-5 steps with a railing? : A Little 6 Click Score: 20    End of Session   Activity Tolerance: Patient limited by fatigue Patient left: in chair;with call bell/phone within reach   PT Visit Diagnosis: Muscle weakness (generalized) (M62.81);Difficulty in walking, not elsewhere classified (R26.2)    Time: [id number] PT Time Calculation (min) (ACUTE ONLY): 62 min   Charges:           Verdene Lennert, PT, DPT  Acute Rehabilitation [phone] pager [phone] office  @  [place]: [phone]   PT Evaluation $PT Eval Moderate Complexity: 1 Mod PT Treatments $Gait Training: 8-22 mins $Therapeutic Exercise: 8-22 mins $Therapeutic Activity: 8-22 mins       04/07/2019, 7:14 PM

## 2019-04-07 NOTE — Progress Notes (Signed)
Patient admitted to 5w05, skin assessed, vitals taken, oriented to room & call bell. BSC placed bedside patient for convenience.

## 2019-04-07 NOTE — Progress Notes (Addendum)
[facility]  PROGRESS NOTE  [name] [name] [id number] DOB: 1945/05/20 DOA: 04/04/2019 PCP: Unk Pinto, MD   Brief HPI:   73 year old female with history of hypertension, dyslipidemia, stage III CKD, paroxysmal atrial fibrillation, diastolic heart failure, depression, anxiety who had tested positive for COVID-19 December 24 at that time her symptoms included fever, cough congestion, myalgias, fatigue, weakness and decreased appetite.  On December 24 she went to urgent care, her heart rate was in 63s.  She was tested positive for COVID-19 and was referred to hospital for further evaluation.  Chest x-ray showed left upper lobe and lower lobe interstitial infiltrate.    Subjective   Patient seen and examined, denies shortness of breath.  Breathing is slowly improving.   Assessment/Plan:     1. Acute hypoxic respiratory failure, due to COVID-19 viral pneumonia, patient is significantly improved.  O2 sats 99% on room air, respiration 12/min.  Patient was started on remdesivir yesterday today day 2/5 of remdesivir.  Continue dexamethasone.  If patient continues to do well, will try to discharge home with outpatient remdesivir at    [facility].  2. Sinus bradycardia with bigeminy-cardiology was consulted, no intervention recommended.  Heart rate is stable.  3. AKI with hypokalemia-improved, today serum creatinine is 1.32, potassium is 4.1.  Patient baseline creatinine is around 1.0  4. Dyslipidemia-continue rosuvastatin  5. GERD-continue pantoprazole.  6. Paroxysmal atrial fibrillation-Coreg is on hold due to patient history of bradycardia at the time of admission.  Continue anticoagulation with Eliquis.  7. Hypothyroidism-continue Synthroid.  8. Depression-continue bupropion, duloxetine, temazepam    COVID-19 Labs  Recent Labs    04/04/19 1715 04/05/19 0500 04/06/19 0500 04/07/19 0500  DDIMER 0.31 0.28 0.33 0.39  FERRITIN 160 144 148 170  LDH 248*  --   --   --    CRP <0.5 0.6 0.6 0.5    No results found for: SARSCOV2NAA   CBG: Recent Labs  Lab 04/06/19 1620  GLUCAP 141*    CBC: Recent Labs  Lab 04/04/19 1251 04/04/19 1715 04/05/19 0500  WBC 6.2 5.7 4.5  NEUTROABS 4.9 4.0 3.1  HGB 11.6* 10.9* 10.7*  HCT 36.4 34.6* 33.6*  MCV 90.1 90.6 91.6  PLT 245 205 [id number]    Basic Metabolic Panel: Recent Labs  Lab 04/04/19 1251 04/04/19 1715 04/05/19 0500 04/05/19 1347 04/06/19 0500 04/07/19 0500  NA 138 138 142 138 139 141  K 2.9* 3.4* 2.8* 4.7 3.2* 4.1  CL 102 103 104 106 107 110  CO2 23 24 26 23 25 24   GLUCOSE 104* 83 88 135* 109* 114*  BUN 18 20 18 17 14 14   CREATININE 2.19* 2.17* 2.03* 1.71* 1.57* 1.32*  CALCIUM 8.5* 8.2* 8.4* 8.3* 8.5* 9.1  MG 2.0  --  2.0  --   --  1.9  PHOS  --   --  3.4  --   --   --        DVT prophylaxis: Apixaban  Code Status: Full code  Family Communication: No family at bedside  Disposition Plan: likely home when medically ready for discharge      BMI  Estimated body mass index is 39.16 kg/m as calculated from the following:   Height as of this encounter: 5\' 7"  (1.702 m).   Weight as of this encounter: 113.4 kg.  Scheduled medications:  . apixaban  5 mg Oral BID  . buPROPion  150 mg Oral Daily  . cholecalciferol  4,000 Units Oral Daily  .  dexamethasone (DECADRON) injection  6 mg Intravenous Q24H  . docusate sodium  100 mg Oral BID  . DULoxetine  30 mg Oral BID  . ferrous sulfate  325 mg Oral BID WC  . fluticasone  1 spray Each Nare Daily  . gabapentin  300 mg Oral BID  . levothyroxine  50 mcg Oral Q0600  . pantoprazole  40 mg Oral Daily  . rosuvastatin  40 mg Oral QHS        Antibiotics:   Anti-infectives (From admission, onward)   Start     Dose/Rate Route Frequency Ordered Stop   04/07/19 1000  remdesivir 100 mg in sodium chloride 0.9 % 100 mL IVPB     100 mg 200 mL/hr over 30 Minutes Intravenous Daily 04/06/19 1053 04/11/19 0959   04/06/19 1100  remdesivir 200  mg in sodium chloride 0.9% 250 mL IVPB     200 mg 580 mL/hr over 30 Minutes Intravenous Once 04/06/19 1053 04/06/19 1346       Objective   Vitals:   04/06/19 2115 04/06/19 2145 04/06/19 2300 04/07/19 0056  BP: 131/73  (!) 142/68 125/71  Pulse: 83 75 75 67  Resp: 18 15 (!) 9 12  Temp:    98.7 F (37.1 C)  TempSrc:    Oral  SpO2: 96% 98% 100% 99%  Weight:      Height:        Intake/Output Summary (Last 24 hours) at 04/07/2019 1458 Last data filed at 04/06/2019 1535 Gross per 24 hour  Intake --  Output 350 ml  Net -350 ml   Filed Weights   04/04/19 1338  Weight: 113.4 kg     Physical Examination:    General: Appears in no acute distress  Cardiovascular: S1-S2, regular, no murmur auscultated  Respiratory: Clear to auscultation bilaterally  Abdomen: Abdomen is soft, nontender, no organomegaly  Extremities: No edema in the lower extremities  Neurologic: Alert, oriented x3, intact insight and judgment     Data Reviewed: I have personally reviewed following labs and imaging studies   Recent Results (from the past 240 hour(s))  Culture, blood (Routine X 2) w Reflex to ID Panel     Status: None (Preliminary result)   Collection Time: 04/04/19  5:19 PM   Specimen: BLOOD RIGHT FOREARM  Result Value Ref Range Status   Specimen Description BLOOD RIGHT FOREARM  Final   Special Requests   Final    BOTTLES DRAWN AEROBIC AND ANAEROBIC Blood Culture results may not be optimal due to an inadequate volume of blood received in culture bottles   Culture   Final    NO GROWTH 3 DAYS Performed at [facility], [place] [address] [place], [place] [address]    Report Status PENDING  Incomplete  Culture, blood (Routine X 2) w Reflex to ID Panel     Status: None (Preliminary result)   Collection Time: 04/05/19  5:38 AM   Specimen: BLOOD  Result Value Ref Range Status   Specimen Description BLOOD LEFT ARM  Final   Special Requests   Final    BOTTLES DRAWN AEROBIC ONLY  Blood Culture results may not be optimal due to an inadequate volume of blood received in culture bottles   Culture   Final    NO GROWTH 2 DAYS Performed at [facility], [place] [address] [place], [place] [address]    Report Status PENDING  Incomplete     Liver Function Tests: Recent Labs  Lab 04/04/19 1715 04/05/19 0500 04/06/19 0500 04/07/19 0500  AST 22 22 18 19   ALT 18 16 15 15   ALKPHOS 78 72 63 69  BILITOT 0.5 0.3 0.7 0.1*  PROT 6.1* 5.7* 5.6* 6.0*  ALBUMIN 3.4* 3.1* 2.9* 3.1*   No results for input(s): LIPASE, AMYLASE in the last 168 hours. No results for input(s): AMMONIA in the last 168 hours.  Cardiac Enzymes: No results for input(s): CKTOTAL, CKMB, CKMBINDEX, TROPONINI in the last 168 hours. BNP (last 3 results) Recent Labs    04/04/19 2350  BNP 155.2*      Admission status: Inpatient: Based on patients clinical presentation and evaluation of above clinical data, I have made determination that patient meets Inpatient criteria at this time.   Oswald Hillock   [facility] If 7PM-7AM, please contact night-coverage at [email or web address], Office  [phone]  password [place]  04/07/2019, 2:58 PM  LOS: 3 days

## 2019-04-08 LAB — BASIC METABOLIC PANEL
Anion gap: 10 (ref 5–15)
BUN: 26 mg/dL — ABNORMAL HIGH (ref 8–23)
CO2: 26 mmol/L (ref 22–32)
Calcium: 8.9 mg/dL (ref 8.9–10.3)
Chloride: 103 mmol/L (ref 98–111)
Creatinine, Ser: 1.18 mg/dL — ABNORMAL HIGH (ref 0.44–1.00)
GFR calc Af Amer: 53 mL/min — ABNORMAL LOW (ref >60)
GFR calc non Af Amer: 46 mL/min — ABNORMAL LOW (ref >60)
Glucose, Bld: 101 mg/dL — ABNORMAL HIGH (ref 70–99)
Potassium: 3.4 mmol/L — ABNORMAL LOW (ref 3.5–5.1)
Sodium: 139 mmol/L (ref 135–145)

## 2019-04-08 MED ORDER — POTASSIUM CHLORIDE CRYS ER 20 MEQ PO TBCR
20.0000 meq | EXTENDED_RELEASE_TABLET | Freq: Once | ORAL | Status: AC
  Administered 2019-04-08: 20 meq via ORAL
  Filled 2019-04-08: qty 1

## 2019-04-08 NOTE — Evaluation (Signed)
Occupational Therapy Evaluation Patient Details Name: [name] [name] MRN: [id number] DOB: 1945-06-21 Today's Date: 04/08/2019    History of Present Illness 73 y.o. female admitted on 04/04/19 for fever, cough, congestion, weakness.  Dx with COVID 19, bradycardia in the 40s.  Other significant dx include acute hypoxic respiratory failure due to COVID 19 viral PNA, sinus brady, cardiology consulted no intervention recommended, AKI with hypokalemia.  Pt with significant PMH of PAF, radiation therapy due to L breast CA, HTN, non ischemic cardiomyopathy, chronic back pain s/p lumbar fusion, anemia, L TKA, bil hand tendon issues s/p surgery.    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 At time of eval, pt is overall min guard for transfers for safety with mild LOB when turning quickly. ECS handout given with education for BADL/IADL routines in the home. Pt agreeable to placing BSC in shower to increase safety. Educated on importance for sitting during sustained standing tasks. Pt able to complete functional mobility beyond household level with VSS on RA. No c/os of SOB. Given current status, no post acute OT indicated. Pt will benefit from acute OT services prior to d/c to ensure ECS understanding and home safety. Will continue to follow per POC listed below.     Follow Up Recommendations  No OT follow up    Equipment Recommendations  3 in 1 bedside commode    Recommendations for Other Services       Precautions / Restrictions Precautions Precautions: Other (comment) Precaution Comments: monitor vitals--bring finger pulse ox as she is no longer on telemetry Restrictions Weight Bearing Restrictions: No      Mobility Bed Mobility               General bed mobility comments: up in chair  Transfers Overall transfer level: Needs assistance   Transfers: Sit to/from Stand Sit to Stand: Min guard         General  transfer comment: min guard for safety, one instance of mild LOB when turning corner quickly    Balance Overall balance assessment: Needs assistance Sitting-balance support: Feet supported;No upper extremity supported Sitting balance-Leahy Scale: Good     Standing balance support: Single extremity supported Standing balance-Leahy Scale: Fair                             ADL either performed or assessed with clinical judgement   ADL Overall ADL's : Needs assistance/impaired Eating/Feeding: Modified independent;Sitting   Grooming: Modified independent;Sitting Grooming Details (indicate cue type and reason): needing seated rest breaks to finish tasks in entirety Upper Body Bathing: Modified independent;Sitting   Lower Body Bathing: Modified independent;Sit to/from stand;Sitting/lateral leans   Upper Body Dressing : Modified independent;Sitting   Lower Body Dressing: Modified independent;Sitting/lateral leans;Sit to/from stand   Toilet Transfer: Contractor- Water quality scientist and Hygiene: Modified independent   Tub/ Banker: Min guard;3 in 1   Functional mobility during ADLs: Min guard General ADL Comments: pt overall presenting with decreased activity tolerance and needs for cues and education on ECS implementation     Vision Baseline Vision/History: No visual deficits       Perception     Praxis      Pertinent Vitals/Pain Pain Assessment: No/denies pain     Hand Dominance     Extremity/Trunk Assessment Upper Extremity Assessment Upper Extremity Assessment: Generalized weakness   Lower Extremity Assessment Lower Extremity  Assessment: Defer to PT evaluation       Communication Communication Communication: No difficulties   Cognition Arousal/Alertness: Awake/alert Behavior During Therapy: WFL for tasks assessed/performed Overall Cognitive Status: Impaired/Different from baseline Area of Impairment: Problem  solving                             Problem Solving: Slow processing General Comments: increased processing time, for multi step commands throughout session.   General Comments       Exercises     Shoulder Instructions      Home Living Family/patient expects to be discharged to:: Private residence Living Arrangements: Alone Available Help at Discharge: Friend(s);Available PRN/intermittently Type of Home: Other(Comment)(condo) Home Access: Level entry     Home Layout: Two level Alternate Level Stairs-Number of Steps: flight Alternate Level Stairs-Rails: Right Bathroom Shower/Tub: Teacher, early years/pre: Standard     Home Equipment: Environmental consultant - 2 wheels;Cane - single point          Prior Functioning/Environment Level of Independence: Independent        Comments: works part time in [employer] at [employer].  4 of her co-workers have [place].         OT Problem List: Decreased strength;Decreased knowledge of use of DME or AE;Decreased activity tolerance;Cardiopulmonary status limiting activity;Impaired balance (sitting and/or standing)      OT Treatment/Interventions: Self-care/ADL training;Therapeutic exercise;Patient/family education;Balance training;Energy conservation;Therapeutic activities;DME and/or AE instruction    OT Goals(Current goals can be found in the care plan section) Acute Rehab OT Goals Patient Stated Goal: increase independence to return to PLOF OT Goal Formulation: With patient Time For Goal Achievement: 04/22/19 Potential to Achieve Goals: Good  OT Frequency: Min 2X/week   Barriers to D/C:            Co-evaluation PT/OT/SLP Co-Evaluation/Treatment: Yes Reason for Co-Treatment: To address functional/ADL transfers PT goals addressed during session: Mobility/safety with mobility OT goals addressed during session: ADL's and self-care      AM-PAC OT "6 Clicks" Daily Activity     Outcome Measure Help  from another person eating meals?: None Help from another person taking care of personal grooming?: None Help from another person toileting, which includes using toliet, bedpan, or urinal?: A Little Help from another person bathing (including washing, rinsing, drying)?: A Little Help from another person to put on and taking off regular upper body clothing?: None Help from another person to put on and taking off regular lower body clothing?: A Little 6 Click Score: 21   End of Session Equipment Utilized During Treatment: Gait belt Nurse Communication: Mobility status  Activity Tolerance: Patient tolerated treatment well Patient left: in chair;with call bell/phone within reach  OT Visit Diagnosis: Other abnormalities of gait and mobility (R26.89);Muscle weakness (generalized) (M62.81)                Time: [id number] OT Time Calculation (min): 50 min Charges:  OT General Charges $OT Visit: 1 Visit OT Evaluation $OT Eval Low Complexity: 1 Low  Zenovia Jarred, MSOT, OTR/L [place] OT/ Acute Relief OT [facility] Office: [place] 04/08/2019, 1:42 PM

## 2019-04-08 NOTE — Progress Notes (Signed)
[facility]  PROGRESS NOTE  [name] [name] [id number] DOB: 14-Dec-1945 DOA: 04/04/2019 PCP: Unk Pinto, MD   Brief HPI:   73 year old female with history of hypertension, dyslipidemia, stage III CKD, paroxysmal atrial fibrillation, diastolic heart failure, depression, anxiety who had tested positive for COVID-19 December 24 at that time her symptoms included fever, cough congestion, myalgias, fatigue, weakness and decreased appetite.  On December 24 she went to urgent care, her heart rate was in 36s.  She was tested positive for COVID-19 and was referred to hospital for further evaluation.  Chest x-ray showed left upper lobe and lower lobe interstitial infiltrate.    Subjective   Patient seen and examined, denies chest pain or shortness of breath.   Assessment/Plan:     1. Acute hypoxic respiratory failure, due to COVID-19 viral pneumonia, patient is significantly improved.  O2 sats 99% on room air, respiration 12/min.  Patient was started on remdesivir yesterday today day 3/5 of remdesivir.  Continue dexamethasone.   2. Sinus bradycardia with bigeminy-cardiology was consulted, no intervention recommended.  Heart rate is stable.  3. AKI with hypokalemia-improved, today serum creatinine is 1.18, potassium is 3.4.  Will replace potassium.   Patient baseline creatinine is around 1.0  4. Dyslipidemia-continue rosuvastatin  5. GERD-continue pantoprazole.  6. Paroxysmal atrial fibrillation-Coreg is on hold due to patient history of bradycardia at the time of admission.  Continue anticoagulation with Eliquis.  7. Hypothyroidism-continue Synthroid.  8. Depression-continue bupropion, duloxetine, temazepam    COVID-19 Labs  Recent Labs    04/06/19 0500 04/07/19 0500  DDIMER 0.33 0.39  FERRITIN 148 170  CRP 0.6 0.5    No results found for: SARSCOV2NAA   CBG: Recent Labs  Lab 04/06/19 1620  GLUCAP 141*    CBC: Recent Labs  Lab 04/04/19 1251  04/04/19 1715 04/05/19 0500  WBC 6.2 5.7 4.5  NEUTROABS 4.9 4.0 3.1  HGB 11.6* 10.9* 10.7*  HCT 36.4 34.6* 33.6*  MCV 90.1 90.6 91.6  PLT 245 205 481    Basic Metabolic Panel: Recent Labs  Lab 04/04/19 1251 04/05/19 0500 04/05/19 1347 04/06/19 0500 04/07/19 0500 04/08/19 0728  NA 138 142 138 139 141 139  K 2.9* 2.8* 4.7 3.2* 4.1 3.4*  CL 102 104 106 107 110 103  CO2 23 26 23 25 24 26   GLUCOSE 104* 88 135* 109* 114* 101*  BUN 18 18 17 14 14  26*  CREATININE 2.19* 2.03* 1.71* 1.57* 1.32* 1.18*  CALCIUM 8.5* 8.4* 8.3* 8.5* 9.1 8.9  MG 2.0 2.0  --   --  1.9  --   PHOS  --  3.4  --   --   --   --     Hepatic Function Latest Ref Rng & Units 04/07/2019 04/06/2019 04/05/2019  Total Protein 6.5 - 8.1 g/dL 6.0(L) 5.6(L) 5.7(L)  Albumin 3.5 - 5.0 g/dL 3.1(L) 2.9(L) 3.1(L)  AST 15 - 41 U/L 19 18 22   ALT 0 - 44 U/L 15 15 16   Alk Phosphatase 38 - 126 U/L 69 63 72  Total Bilirubin 0.3 - 1.2 mg/dL 0.1(L) 0.7 0.3  Bilirubin, Direct 0.0 - 0.2 mg/dL - - -     DVT prophylaxis: Apixaban  Code Status: Full code  Family Communication: No family at bedside  Disposition Plan: likely home after completing remdesivir.      BMI  Estimated body mass index is 39.16 kg/m as calculated from the following:   Height as of this encounter: 5' 7"  (1.702 m).  Weight as of this encounter: 113.4 kg.  Scheduled medications:  . apixaban  5 mg Oral BID  . buPROPion  150 mg Oral Daily  . cholecalciferol  4,000 Units Oral Daily  . dexamethasone (DECADRON) injection  6 mg Intravenous Q24H  . docusate sodium  100 mg Oral BID  . DULoxetine  30 mg Oral BID  . ferrous sulfate  325 mg Oral BID WC  . fluticasone  1 spray Each Nare Daily  . gabapentin  300 mg Oral BID  . levothyroxine  50 mcg Oral Q0600  . pantoprazole  40 mg Oral Daily  . rosuvastatin  40 mg Oral QHS        Antibiotics:   Anti-infectives (From admission, onward)   Start     Dose/Rate Route Frequency Ordered Stop    04/07/19 1000  remdesivir 100 mg in sodium chloride 0.9 % 100 mL IVPB     100 mg 200 mL/hr over 30 Minutes Intravenous Daily 04/06/19 1053 04/11/19 0959   04/06/19 1100  remdesivir 200 mg in sodium chloride 0.9% 250 mL IVPB     200 mg 580 mL/hr over 30 Minutes Intravenous Once 04/06/19 1053 04/06/19 1346       Objective   Vitals:   04/07/19 0056 04/07/19 2047 04/08/19 0513 04/08/19 1235  BP: 125/71 (!) 134/51 125/63 138/73  Pulse: 67 70 72 78  Resp: 12   18  Temp: 98.7 F (37.1 C) 98.8 F (37.1 C) 98.7 F (37.1 C) 98.7 F (37.1 C)  TempSrc: Oral Oral Oral Oral  SpO2: 99%   100%  Weight:      Height:        Intake/Output Summary (Last 24 hours) at 04/08/2019 1336 Last data filed at 04/07/2019 1600 Gross per 24 hour  Intake 100 ml  Output --  Net 100 ml   Filed Weights   04/04/19 1338  Weight: 113.4 kg     Physical Examination:    General-appears in no acute distress  Heart-S1-S2, regular, no murmur auscultated  Lungs-clear to auscultation bilaterally, no wheezing or crackles auscultated  Abdomen-soft, nontender, no organomegaly  Extremities-no edema in the lower extremities  Neuro-alert, oriented x3, no focal deficit noted     Data Reviewed: I have personally reviewed following labs and imaging studies   Recent Results (from the past 240 hour(s))  Culture, blood (Routine X 2) w Reflex to ID Panel     Status: None (Preliminary result)   Collection Time: 04/04/19  5:19 PM   Specimen: BLOOD RIGHT FOREARM  Result Value Ref Range Status   Specimen Description BLOOD RIGHT FOREARM  Final   Special Requests   Final    BOTTLES DRAWN AEROBIC AND ANAEROBIC Blood Culture results may not be optimal due to an inadequate volume of blood received in culture bottles   Culture   Final    NO GROWTH 3 DAYS Performed at [facility], [address] [place], [place] [address]    Report Status PENDING  Incomplete  Culture, blood (Routine X 2) w Reflex to ID  Panel     Status: None (Preliminary result)   Collection Time: 04/05/19  5:38 AM   Specimen: BLOOD  Result Value Ref Range Status   Specimen Description BLOOD LEFT ARM  Final   Special Requests   Final    BOTTLES DRAWN AEROBIC ONLY Blood Culture results may not be optimal due to an inadequate volume of blood received in culture bottles   Culture  Final    NO GROWTH 2 DAYS Performed at [facility], [place] [address] [place], [place] [address]    Report Status PENDING  Incomplete     Liver Function Tests: Recent Labs  Lab 04/04/19 1715 04/05/19 0500 04/06/19 0500 04/07/19 0500  AST 22 22 18 19   ALT 18 16 15 15   ALKPHOS 78 72 63 69  BILITOT 0.5 0.3 0.7 0.1*  PROT 6.1* 5.7* 5.6* 6.0*  ALBUMIN 3.4* 3.1* 2.9* 3.1*   No results for input(s): LIPASE, AMYLASE in the last 168 hours. No results for input(s): AMMONIA in the last 168 hours.  Cardiac Enzymes: No results for input(s): CKTOTAL, CKMB, CKMBINDEX, TROPONINI in the last 168 hours. BNP (last 3 results) Recent Labs    04/04/19 2350  BNP 155.2*      Admission status: Inpatient: Based on patients clinical presentation and evaluation of above clinical data, I have made determination that patient meets Inpatient criteria at this time.   Oswald Hillock   [facility] If 7PM-7AM, please contact night-coverage at [email or web address], Office  [phone]  password [email or web address]  04/08/2019, 1:36 PM  LOS: 4 days

## 2019-04-08 NOTE — Progress Notes (Signed)
Physical Therapy Treatment Patient Details Name: [name] [name] MRN: [id number] DOB: 1945-11-03 Today's Date: 04/08/2019    History of Present Illness 73 y.o. female admitted on 04/04/19 for fever, cough, congestion, weakness.  Dx with COVID 19, bradycardia in the 40s.  Other significant dx include acute hypoxic respiratory failure due to COVID 19 viral PNA, sinus brady, cardiology consulted no intervention recommended, AKI with hypokalemia.  Pt with significant PMH of PAF, radiation therapy due to L breast CA, HTN, non ischemic cardiomyopathy, chronic back pain s/p lumbar fusion, anemia, L TKA, bil hand tendon issues s/p surgery.     PT Comments    Pt making progress towards her goals increasing her ambulation distance and stability. Pt min guard for transfers. Pt min guard for majority of ambulation on RA without AD, although 1x LoB requiring minA for steadying. Pt also able to perform UE therex with red theraband with minor cuing for form. Pt reports that her insurance company will not pay for outpatient Remdesimir infusion so she will likely be here until 04/10/19. D/c plans remain appropriate at this time. PT will continue to follow acutely.    Follow Up Recommendations  Home health PT     Equipment Recommendations  None recommended by PT       Precautions / Restrictions Precautions Precautions: Other (comment) Precaution Comments: monitor vitals--bring finger pulse ox as she is no longer on telemetry Restrictions Weight Bearing Restrictions: No    Mobility  Bed Mobility               General bed mobility comments: up in chair  Transfers Overall transfer level: Needs assistance   Transfers: Sit to/from Stand Sit to Stand: Min guard         General transfer comment: min guard for safety, one instance of mild LOB when turning corner quickly  Ambulation/Gait Ambulation/Gait assistance: Min guard;Min assist Gait Distance (Feet): 450 Feet Assistive device:  None Gait Pattern/deviations: Step-through pattern;Drifts right/left Gait velocity: decreased Gait velocity interpretation: 1.31 - 2.62 ft/sec, indicative of limited community ambulator General Gait Details: min guard for the majority of ambulation 1x LoB coming out of room and turning to L which requires min A for steadying. Pt with staggering gait drifitng left and right in hallway no additional LoB or use of handrails in hallway       Balance Overall balance assessment: Needs assistance Sitting-balance support: Feet supported;No upper extremity supported Sitting balance-Leahy Scale: Good     Standing balance support: Single extremity supported Standing balance-Leahy Scale: Fair                              Cognition Arousal/Alertness: Awake/alert Behavior During Therapy: WFL for tasks assessed/performed Overall Cognitive Status: Impaired/Different from baseline Area of Impairment: Problem solving                             Problem Solving: Slow processing General Comments: increased processing time, for multi step commands throughout session.      Exercises General Exercises - Upper Extremity Shoulder ABduction: AROM;Both;10 reps;Seated;Theraband Theraband Level (Shoulder Abduction): Level 2 (Red) Shoulder Horizontal ABduction: AROM;Both;10 reps;Seated;Theraband Theraband Level (Shoulder Horizontal Abduction): Level 2 (Red) Elbow Flexion: AROM;Both;10 reps;Seated;Theraband Theraband Level (Elbow Flexion): Level 2 (Red) Elbow Extension: AROM;Both;10 reps;Seated;Theraband Theraband Level (Elbow Extension): Level 2 (Red)    General Comments General comments (skin integrity, edema, etc.): Pt ambulated on RA  and was able to maintain SaO2 >93%O2      Pertinent Vitals/Pain Pain Assessment: No/denies pain    Home Living Family/patient expects to be discharged to:: Private residence Living Arrangements: Alone Available Help at Discharge:  Friend(s);Available PRN/intermittently Type of Home: Other(Comment)(condo) Home Access: Level entry   Home Layout: Two level Home Equipment: Walker - 2 wheels;Cane - single point      Prior Function Level of Independence: Independent      Comments: works part time in [employer] at [employer].  4 of her co-workers have [place].    PT Goals (current goals can now be found in the care plan section) Acute Rehab PT Goals Patient Stated Goal: increase independence to return to PLOF PT Goal Formulation: With patient Time For Goal Achievement: 04/21/19 Potential to Achieve Goals: Good Progress towards PT goals: Progressing toward goals    Frequency    Min 3X/week      PT Plan Current plan remains appropriate    Co-evaluation PT/OT/SLP Co-Evaluation/Treatment: Yes Reason for Co-Treatment: To address functional/ADL transfers PT goals addressed during session: Mobility/safety with mobility OT goals addressed during session: ADL's and self-care      AM-PAC PT "6 Clicks" Mobility   Outcome Measure  Help needed turning from your back to your side while in a flat bed without using bedrails?: None Help needed moving from lying on your back to sitting on the side of a flat bed without using bedrails?: None Help needed moving to and from a bed to a chair (including a wheelchair)?: A Little Help needed standing up from a chair using your arms (e.g., wheelchair or bedside chair)?: A Little Help needed to walk in hospital room?: A Little Help needed climbing 3-5 steps with a railing? : A Little 6 Click Score: 20    End of Session Equipment Utilized During Treatment: Gait belt Activity Tolerance: Patient tolerated treatment well Patient left: in chair;with call bell/phone within reach Nurse Communication: Mobility status PT Visit Diagnosis: Muscle weakness (generalized) (M62.81);Difficulty in walking, not elsewhere classified (R26.2)     Time: [id number] PT Time  Calculation (min) (ACUTE ONLY): 45 min  Charges:  $Gait Training: 8-22 mins $Therapeutic Exercise: 8-22 mins                     Daniya Aramburo B. Migdalia Dk PT, DPT Acute Rehabilitation Services Pager [phone] Office [phone]    [place] 04/08/2019, 2:09 PM

## 2019-04-08 NOTE — TOC Initial Note (Addendum)
Transition of Care [facility]) - Initial/Assessment Note    Patient Details  Name: [name] [name] MRN: [id number] Date of Birth: [birth year]  [facility] CM/SW Contact:    Bartholomew Crews, RN Phone Number: [phone] 04/08/2019, 3:07 PM  Clinical Narrative:                 Spoke with patient on the phone. PTA home alone. Independent. Drives. Verified PCP and pharmacy in [place] as correct. Discussed HH recommendations for PT. Offered choice of agencies. Referral accepted by [facility]. Patient will need HH order for PT with Face to Face at discharge. No DME at home - patient declined need for 3N1. Discussed that son had called and left message for call back - patient provided permission for NCM to return call stating he was first on her contact list. TOC team following for transition needs.   Expected Discharge Plan: [place] Barriers to Discharge: Continued Medical Work up   Patient Goals and CMS Choice Patient states their goals for this hospitalization and ongoing recovery are:: return to her home CMS Medicare.gov Compare Post Acute Care list provided to:: Patient Choice offered to / list presented to : Patient  Expected Discharge Plan and Services Expected Discharge Plan: [place]   Discharge Planning Services: CM Consult Post Acute Care Choice: [place] arrangements for the past 2 months: Single Family Home                 DME Arranged: N/A DME Agency: NA       HH Arranged: PT          Prior Living Arrangements/Services Living arrangements for the past 2 months: Single Family Home Lives with:: Self Patient language and need for interpreter reviewed:: Yes Do you feel safe going back to the place where you live?: Yes      Need for Family Participation in Patient Care: No (Comment)     Criminal Activity/Legal Involvement Pertinent to Current Situation/Hospitalization: No - Comment as needed  Activities of Daily  Living Home Assistive Devices/Equipment: None ADL Screening (condition at time of admission) Patient's cognitive ability adequate to safely complete daily activities?: Yes Is the patient deaf or have difficulty hearing?: No Does the patient have difficulty seeing, even when wearing glasses/contacts?: No Does the patient have difficulty concentrating, remembering, or making decisions?: No Patient able to express need for assistance with ADLs?: Yes Does the patient have difficulty dressing or bathing?: No Independently performs ADLs?: Yes (appropriate for developmental age) Does the patient have difficulty walking or climbing stairs?: No Weakness of Legs: Both Weakness of Arms/Hands: None  Permission Sought/Granted Permission sought to share information with : Family Supports Permission granted to share information with : Yes, Verbal Permission Granted  Share Information with NAME: [name]     Permission granted to share info w Relationship: son  Permission granted to share info w Contact Information: [phone]  Emotional Assessment Appearance:: Appears stated age Attitude/Demeanor/Rapport: Engaged Affect (typically observed): Accepting Orientation: : Oriented to Self, Oriented to  Time, Oriented to Place, Oriented to Situation Alcohol / Substance Use: Not Applicable Psych Involvement: No (comment)  Admission diagnosis:  Hypokalemia [E87.6] Bradycardia [R00.1] AKI (acute kidney injury) ([place]) [N17.9] Cardiac arrhythmia, unspecified cardiac arrhythmia type [I49.9] COVID-19 [U07.1] Patient Active Problem List   Diagnosis Date Noted  . Pneumonia due to COVID-19 virus 04/05/2019  . Acute respiratory failure with hypoxia ([place]) 04/05/2019  . Bradycardia   .  Depression   . Hypokalemia   . AKI (acute kidney injury) ([place])   . COVID-19 virus infection   . CKD (chronic kidney disease) stage 3, GFR 30-59 ml/min 09/21/2018  . Morbid obesity ([place]) 06/14/2017  . Primary  osteoarthritis of left knee 09/23/2016  . Non-ischemic cardiomyopathy ([place])   . Chronic combined systolic (congestive) and diastolic (congestive) heart failure ([place]) 08/08/2015  . Paroxysmal atrial fibrillation [facility]): CHA2DS2-VASc Score - starting Eliquis 08/06/2015  . Hypothyroidism 08/06/2015  . Absolute anemia   . Encounter for Medicare annual wellness exam 03/31/2015  . DDD (degenerative disc disease), lumbar 06/16/2014  . Other abnormal glucose (prediabetes) 12/18/2013  . Vitamin D deficiency 12/18/2013  . Medication management 12/18/2013  . Paroxysmal supraventricular tachycardia ([place]) 10/18/2013  . Radiculopathy 10/16/2013  . Malignant neoplasm of lower-inner quadrant of left breast in female, estrogen receptor positive ([place]) 05/20/2013  . Hyperlipidemia 03/13/2008  . Depression, major, recurrent, in partial remission ([place]) 03/13/2008  . Hypertension 03/13/2008  . H/O left bundle branch block 03/13/2008  . Allergic rhinitis 03/13/2008  . GERD 03/13/2008   PCP:  Unk Pinto, MD Pharmacy:   [id number] - [place], [place]. AT [address] [place] [address] Phone: [phone] Fax: [place] [address], [place] - [id number] [place] [place] [address] Phone: [phone] Fax: [place], [place] [facility] [address] [place] [address] [place] [address] Phone: [phone] Fax: [phone]     Social Determinants of Health ([place]) Interventions    Readmission Risk Interventions No flowsheet data found.

## 2019-04-09 DIAGNOSIS — I499 Cardiac arrhythmia, unspecified: Secondary | ICD-10-CM

## 2019-04-09 DIAGNOSIS — I5032 Chronic diastolic (congestive) heart failure: Secondary | ICD-10-CM

## 2019-04-09 LAB — BASIC METABOLIC PANEL
Anion gap: 8 (ref 5–15)
BUN: 24 mg/dL — ABNORMAL HIGH (ref 8–23)
CO2: 25 mmol/L (ref 22–32)
Calcium: 8.6 mg/dL — ABNORMAL LOW (ref 8.9–10.3)
Chloride: 108 mmol/L (ref 98–111)
Creatinine, Ser: 1.06 mg/dL — ABNORMAL HIGH (ref 0.44–1.00)
GFR calc Af Amer: >60 mL/min (ref >60)
GFR calc non Af Amer: 52 mL/min — ABNORMAL LOW (ref >60)
Glucose, Bld: 121 mg/dL — ABNORMAL HIGH (ref 70–99)
Potassium: 3.5 mmol/L (ref 3.5–5.1)
Sodium: 141 mmol/L (ref 135–145)

## 2019-04-09 LAB — CULTURE, BLOOD (ROUTINE X 2): Culture: NO GROWTH

## 2019-04-09 MED ORDER — METOPROLOL TARTRATE 25 MG PO TABS
25.0000 mg | ORAL_TABLET | Freq: Two times a day (BID) | ORAL | Status: DC
  Administered 2019-04-09 – 2019-04-10 (×3): 25 mg via ORAL
  Filled 2019-04-09 (×3): qty 1

## 2019-04-09 MED ORDER — FUROSEMIDE 20 MG PO TABS
20.0000 mg | ORAL_TABLET | Freq: Every day | ORAL | Status: DC
  Administered 2019-04-09 – 2019-04-10 (×2): 20 mg via ORAL
  Filled 2019-04-09 (×2): qty 1

## 2019-04-09 NOTE — Progress Notes (Signed)
[facility]  PROGRESS NOTE  [name] [name] ZOX:[id number] DOB: 1945-09-27 DOA: 04/04/2019 PCP: Unk Pinto, MD   Brief HPI:   73 year old female with history of hypertension, dyslipidemia, stage III CKD, paroxysmal atrial fibrillation, diastolic heart failure, depression, anxiety who had tested positive for COVID-19 December 24 at that time her symptoms included fever, cough congestion, myalgias, fatigue, weakness and decreased appetite.  On December 24 she went to urgent care, her heart rate was in 34s.  She was tested positive for COVID-19 and was referred to hospital for further evaluation.  Chest x-ray showed left upper lobe and lower lobe interstitial infiltrate.    Subjective   Patient seen and examined, denies chest pain or shortness of breath.  She is not requiring oxygen.   Assessment/Plan:     1. Acute hypoxic respiratory failure, due to COVID-19 viral pneumonia, patient is significantly improved.  O2 sats 99% on room air, respiration 12/min.  Patient was started on remdesivir on 04/06/2019.  Today is day 4/5 of remdesivir.  Last dose is tomorrow.   Continue dexamethasone.   2. Sinus bradycardia with bigeminy-cardiology was consulted, no intervention recommended.  Heart rate is stable.  Patient to follow-up with Dr. Ellyn Hack as outpatient for Holter monitoring.  3. Chronic combined systolic and diastolic CHF-we will switch from Coreg to metoprolol 25 twice daily as per cardiology recommendation.  Also start low-dose furosemide 20 mg daily.  4. AKI with hypokalemia-improved, today serum creatinine is 1 06, potassium is 3.5.   Patient baseline creatinine is around 1.0  5. Dyslipidemia-continue rosuvastatin  6. GERD-continue pantoprazole.  7. Paroxysmal atrial fibrillation-Coreg is on hold due to patient history of bradycardia at the time of admission.  Continue anticoagulation with Eliquis.  8. Hypothyroidism-continue Synthroid.  9. Depression-continue  bupropion, duloxetine, temazepam    COVID-19 Labs  Recent Labs    04/07/19 0500  DDIMER 0.39  FERRITIN 170  CRP 0.5    No results found for: SARSCOV2NAA   CBG: Recent Labs  Lab 04/06/19 1620  GLUCAP 141*    CBC: Recent Labs  Lab 04/04/19 1251 04/04/19 1715 04/05/19 0500  WBC 6.2 5.7 4.5  NEUTROABS 4.9 4.0 3.1  HGB 11.6* 10.9* 10.7*  HCT 36.4 34.6* 33.6*  MCV 90.1 90.6 91.6  PLT 245 205 811    Basic Metabolic Panel: Recent Labs  Lab 04/04/19 1251 04/05/19 0500 04/05/19 1347 04/06/19 0500 04/07/19 0500 04/08/19 0728 04/09/19 0419  NA 138 142 138 139 141 139 141  K 2.9* 2.8* 4.7 3.2* 4.1 3.4* 3.5  CL 102 104 106 107 110 103 108  CO2 23 26 23 25 24 26 25   GLUCOSE 104* 88 135* 109* 114* 101* 121*  BUN 18 18 17 14 14  26* 24*  CREATININE 2.19* 2.03* 1.71* 1.57* 1.32* 1.18* 1.06*  CALCIUM 8.5* 8.4* 8.3* 8.5* 9.1 8.9 8.6*  MG 2.0 2.0  --   --  1.9  --   --   PHOS  --  3.4  --   --   --   --   --     Hepatic Function Latest Ref Rng & Units 04/07/2019 04/06/2019 04/05/2019  Total Protein 6.5 - 8.1 g/dL 6.0(L) 5.6(L) 5.7(L)  Albumin 3.5 - 5.0 g/dL 3.1(L) 2.9(L) 3.1(L)  AST 15 - 41 U/L 19 18 22   ALT 0 - 44 U/L 15 15 16   Alk Phosphatase 38 - 126 U/L 69 63 72  Total Bilirubin 0.3 - 1.2 mg/dL 0.1(L) 0.7 0.3  Bilirubin, Direct  0.0 - 0.2 mg/dL - - -     DVT prophylaxis: Apixaban  Code Status: Full code  Family Communication: No family at bedside  Disposition Plan: likely home after completing remdesivir.      BMI  Estimated body mass index is 39.16 kg/m as calculated from the following:   Height as of this encounter: 5' 7"  (1.702 m).   Weight as of this encounter: 113.4 kg.  Scheduled medications:  . apixaban  5 mg Oral BID  . buPROPion  150 mg Oral Daily  . cholecalciferol  4,000 Units Oral Daily  . dexamethasone (DECADRON) injection  6 mg Intravenous Q24H  . docusate sodium  100 mg Oral BID  . DULoxetine  30 mg Oral BID  . ferrous  sulfate  325 mg Oral BID WC  . fluticasone  1 spray Each Nare Daily  . gabapentin  300 mg Oral BID  . levothyroxine  50 mcg Oral Q0600  . pantoprazole  40 mg Oral Daily  . rosuvastatin  40 mg Oral QHS        Antibiotics:   Anti-infectives (From admission, onward)   Start     Dose/Rate Route Frequency Ordered Stop   04/07/19 1000  remdesivir 100 mg in sodium chloride 0.9 % 100 mL IVPB     100 mg 200 mL/hr over 30 Minutes Intravenous Daily 04/06/19 1053 04/11/19 0959   04/06/19 1100  remdesivir 200 mg in sodium chloride 0.9% 250 mL IVPB     200 mg 580 mL/hr over 30 Minutes Intravenous Once 04/06/19 1053 04/06/19 1346       Objective   Vitals:   04/08/19 1235 04/08/19 2201 04/09/19 0506 04/09/19 0515  BP: 138/73 (!) 149/73 129/66   Pulse: 78 70 67 68  Resp: 18 15 17 16   Temp: 98.7 F (37.1 C) 98.5 F (36.9 C) 97.9 F (36.6 C) 98.1 F (36.7 C)  TempSrc: Oral Oral Oral Oral  SpO2: 100% 100% 98% 97%  Weight:      Height:        Intake/Output Summary (Last 24 hours) at 04/09/2019 1155 Last data filed at 04/09/2019 0100 Gross per 24 hour  Intake 340 ml  Output 700 ml  Net -360 ml   Filed Weights   04/04/19 1338  Weight: 113.4 kg     Physical Examination:  General-appears in no acute distress Heart-S1-S2, regular, no murmur auscultated Lungs-clear to auscultation bilaterally, no wheezing or crackles auscultated Abdomen-soft, nontender, no organomegaly Extremities-no edema in the lower extremities Neuro-alert, oriented x3, no focal deficit noted     Data Reviewed: I have personally reviewed following labs and imaging studies   Recent Results (from the past 240 hour(s))  Culture, blood (Routine X 2) w Reflex to ID Panel     Status: None   Collection Time: 04/04/19  5:19 PM   Specimen: BLOOD RIGHT FOREARM  Result Value Ref Range Status   Specimen Description BLOOD RIGHT FOREARM  Final   Special Requests   Final    BOTTLES DRAWN AEROBIC AND ANAEROBIC  Blood Culture results may not be optimal due to an inadequate volume of blood received in culture bottles   Culture   Final    NO GROWTH 5 DAYS Performed at [facility], [address], [place], [place] [address]    Report Status 04/09/2019 FINAL  Final  Culture, blood (Routine X 2) w Reflex to ID Panel     Status: None (Preliminary result)   Collection  Time: 04/05/19  5:38 AM   Specimen: BLOOD  Result Value Ref Range Status   Specimen Description BLOOD LEFT ARM  Final   Special Requests   Final    BOTTLES DRAWN AEROBIC ONLY Blood Culture results may not be optimal due to an inadequate volume of blood received in culture bottles   Culture   Final    NO GROWTH 4 DAYS Performed at [facility], [place] [address], [place], [place] [address]    Report Status PENDING  Incomplete     Liver Function Tests: Recent Labs  Lab 04/04/19 1715 04/05/19 0500 04/06/19 0500 04/07/19 0500  AST 22 22 18 19   ALT 18 16 15 15   ALKPHOS 78 72 63 69  BILITOT 0.5 0.3 0.7 0.1*  PROT 6.1* 5.7* 5.6* 6.0*  ALBUMIN 3.4* 3.1* 2.9* 3.1*   No results for input(s): LIPASE, AMYLASE in the last 168 hours. No results for input(s): AMMONIA in the last 168 hours.  Cardiac Enzymes: No results for input(s): CKTOTAL, CKMB, CKMBINDEX, TROPONINI in the last 168 hours. BNP (last 3 results) Recent Labs    04/04/19 2350  BNP 155.2*      Admission status: Inpatient: Based on patients clinical presentation and evaluation of above clinical data, I have made determination that patient meets Inpatient criteria at this time.   Oswald Hillock   [facility] If 7PM-7AM, please contact night-coverage at [email or web address], Office  [phone]  password [email or web address]  04/09/2019, 11:55 AM  LOS: 5 days

## 2019-04-10 LAB — CULTURE, BLOOD (ROUTINE X 2): Culture: NO GROWTH

## 2019-04-10 MED ORDER — DEXAMETHASONE 6 MG PO TABS: 6.0000 mg | ORAL_TABLET | Freq: Every day | ORAL | 0 refills | Status: DC

## 2019-04-10 MED ORDER — BENZONATATE 100 MG PO CAPS: 100.0000 mg | ORAL_CAPSULE | Freq: Three times a day (TID) | ORAL | 0 refills | Status: AC | PRN

## 2019-04-10 MED ORDER — METOPROLOL TARTRATE 25 MG PO TABS: 25.0000 mg | ORAL_TABLET | Freq: Two times a day (BID) | ORAL | 2 refills | Status: DC

## 2019-04-10 MED ORDER — FUROSEMIDE 20 MG PO TABS: 20.0000 mg | ORAL_TABLET | Freq: Every day | ORAL | 2 refills | Status: DC

## 2019-04-10 NOTE — Care Management Important Message (Signed)
Important Message  Patient Details  Name: [name] [name] MRN: [id number] Date of Birth: 1945/08/17   Medicare Important Message Given:  Yes - Important Message mailed due to current National Emergency  Verbal consent obtained due to current National Emergency  Relationship to patient: Child Contact Name: [name] Call Date: 04/10/19  Time: 1200 Phone: [phone] Outcome: Spoke with contact Important Message mailed to: Patient address on file    [place] 04/10/2019, 12:01 PM

## 2019-04-10 NOTE — Discharge Summary (Signed)
Physician Discharge Summary  [name] [name] [id number] DOB: 1945-12-25 DOA: 04/04/2019  PCP: Unk Pinto, MD  Admit date: 04/04/2019 Discharge date: 04/10/2019  Time spent: 50 minutes  Recommendations for Outpatient Follow-up:  1. Follow-up PCP in 3 weeks 2. Follow-up cardiology in 3 weeks for Holter monitoring as outpatient. 3. Need to quarantine till April 25, 2019.   Discharge Diagnoses:  Principal Problem:   Pneumonia due to COVID-19 virus Active Problems:   Hyperlipidemia   Hypertension   GERD   Paroxysmal atrial fibrillation ([place]): CHA2DS2-VASc Score - starting Eliquis   Chronic combined systolic (congestive) and diastolic (congestive) heart failure (HCC)   Morbid obesity (HCC)   CKD (chronic kidney disease) stage 3, GFR 30-59 ml/min   Bradycardia   Depression   Hypokalemia   AKI (acute kidney injury) ([place])   COVID-19 virus infection   Acute respiratory failure with hypoxia ([place])   Discharge Condition: Stable  Diet recommendation: Heart healthy diet  Filed Weights   04/04/19 1338  Weight: 113.4 kg    History of present illness:  73 year old female with history of hypertension, dyslipidemia, stage III CKD, paroxysmal atrial fibrillation, diastolic heart failure, depression, anxiety who had tested positive for COVID-19 December 24 at that time her symptoms included fever, cough congestion, myalgias, fatigue, weakness and decreased appetite.  On December 24 she went to urgent care, her heart rate was in 72s.  She was tested positive for COVID-19 and was referred to hospital for further evaluation.  Chest x-ray showed left upper lobe and lower lobe interstitial infiltrate.   Hospital Course:   1. *Acute hypoxic respiratory failure, due to COVID-19 viral pneumonia, patient is significantly improved.  O2 sats 99% on room air, respiration 12/min.  Patient was started on remdesivir on 04/06/2019.    She has completed 5 days of treatment with remdesivir.     Continue dexamethasone.  Will discharge on 5 more days of Decadron 6 mg p.o. daily.  2. Sinus bradycardia with bigeminy-cardiology was consulted, no intervention recommended.  Heart rate is stable.  Patient to follow-up with Dr. Ellyn Hack as outpatient for Holter monitoring.  3. Chronic combined systolic and diastolic CHF-we will switch from Coreg to metoprolol 25 twice daily as per cardiology recommendation.  Also start low-dose furosemide 20 mg daily.  4. AKI with hypokalemia-improved, today serum creatinine is 1 06, potassium is 3.5.   Patient baseline creatinine is around 1.0  5. Dyslipidemia-continue rosuvastatin  6. GERD-continue pantoprazole.  7. Paroxysmal atrial fibrillation-Coreg is on hold due to patient history of bradycardia at the time of admission.  Continue anticoagulation with Eliquis.  Started on metoprolol 25 mg p.o. twice daily as above.  8. Hypertension-we will restart losartan as outpatient, continue metoprolol as above.  8. Hypothyroidism-continue Synthroid.  9. Depression-continue bupropion, duloxetine, temazepam   Procedures:  Echocardiogram FINDINGS  Left Ventricle: Left ventricular ejection fraction, by visual estimation, is 60 to 65%. There is no increased left ventricular wall thickness. Abnormal (paradoxical) septal motion, consistent with left bundle branch block. Left ventricular diastolic  parameters are consistent with Grade I diastolic dysfunction (impaired relaxation).  Right Ventricle: The right ventricular size is normal. No increase in right ventricular wall thickness. Global RV systolic function is has normal systolic function. The tricuspid regurgitant velocity is 2.05 m/s, and with an assumed right atrial pressure  of 3 mmHg, the estimated right ventricular systolic pressure is normal at 19.8 mmHg.  Left Atrium: Left atrial size was mildly dilated.  Pericardium: There is no evidence of  pericardial effusion is seen. There is no  evidence of pericardial effusion.  Mitral Valve: The mitral valve is normal in structure. MV Area by PHT, 4.06 cm. MV PHT, 54.23 msec. Trivial mitral valve regurgitation.  Tricuspid Valve: The tricuspid valve is normal in structure. Tricuspid valve regurgitation is trivial.  Pulmonic Valve: The pulmonic valve was normal in structure. Pulmonic valve regurgitation is trivial by color flow Doppler. Pulmonic regurgitation is trivial by color flow Doppler.  Consultations:  Cardiology  Discharge Exam: Vitals:   04/10/19 0644 04/10/19 0932  BP: (!) 162/79 (!) 160/74  Pulse: 64   Resp:    Temp: 98.5 F (36.9 C)   SpO2: 98%     General: Appears in no acute distress Cardiovascular: S1-S2, regular, no murmur auscultated Respiratory: Clear to auscultation bilaterally  Discharge Instructions   Discharge Instructions    Diet - low sodium heart healthy   Complete by: As directed    Discharge instructions   Complete by: As directed    Quarantine till 04/25/19   Person Under Monitoring Name: [name] [name]  Location: [address] [place] [address]   Infection Prevention Recommendations for Individuals Confirmed to have, or Being Evaluated for, 2019 Novel Coronavirus (COVID-19) Infection Who Receive Care at Home  Individuals who are confirmed to have, or are being evaluated for, COVID-19 should follow the prevention steps below until a healthcare provider or local or state health department says they can return to normal activities.  Stay home except to get medical care You should restrict activities outside your home, except for getting medical care. Do not go to work, school, or public areas, and do not use public transportation or taxis.  Call ahead before visiting your doctor Before your medical appointment, call the healthcare provider and tell them that you have, or are being evaluated for, COVID-19 infection. This will help the healthcare provider's  office take steps to keep other people from getting infected. Ask your healthcare provider to call the local or state health department.  Monitor your symptoms Seek prompt medical attention if your illness is worsening (e.g., difficulty breathing). Before going to your medical appointment, call the healthcare provider and tell them that you have, or are being evaluated for, COVID-19 infection. Ask your healthcare provider to call the local or state health department.  Wear a facemask You should wear a facemask that covers your nose and mouth when you are in the same room with other people and when you visit a healthcare provider. People who live with or visit you should also wear a facemask while they are in the same room with you.  Separate yourself from other people in your home As much as possible, you should stay in a different room from other people in your home. Also, you should use a separate bathroom, if available.  Avoid sharing household items You should not share dishes, drinking glasses, cups, eating utensils, towels, bedding, or other items with other people in your home. After using these items, you should wash them thoroughly with soap and water.  Cover your coughs and sneezes Cover your mouth and nose with a tissue when you cough or sneeze, or you can cough or sneeze into your sleeve. Throw used tissues in a lined trash can, and immediately wash your hands with soap and water for at least 20 seconds or use an alcohol-based hand rub.  Wash your [facility] your hands often and thoroughly with soap and water for at least  20 seconds. You can use an alcohol-based hand sanitizer if soap and water are not available and if your hands are not visibly dirty. Avoid touching your eyes, nose, and mouth with unwashed hands.   Prevention Steps for Caregivers and Household Members of Individuals Confirmed to have, or Being Evaluated for, COVID-19 Infection Being Cared for in the  Home  If you live with, or provide care at home for, a person confirmed to have, or being evaluated for, COVID-19 infection please follow these guidelines to prevent infection:  Follow healthcare provider's instructions Make sure that you understand and can help the patient follow any healthcare provider instructions for all care.  Provide for the patient's basic needs You should help the patient with basic needs in the home and provide support for getting groceries, prescriptions, and other personal needs.  Monitor the patient's symptoms If they are getting sicker, call his or her medical provider and tell them that the patient has, or is being evaluated for, COVID-19 infection. This will help the healthcare provider's office take steps to keep other people from getting infected. Ask the healthcare provider to call the local or state health department.  Limit the number of people who have contact with the patient If possible, have only one caregiver for the patient. Other household members should stay in another home or place of residence. If this is not possible, they should stay in another room, or be separated from the patient as much as possible. Use a separate bathroom, if available. Restrict visitors who do not have an essential need to be in the home.  Keep older adults, very young children, and other sick people away from the patient Keep older adults, very young children, and those who have compromised immune systems or chronic health conditions away from the patient. This includes people with chronic heart, lung, or kidney conditions, diabetes, and cancer.  Ensure good ventilation Make sure that shared spaces in the home have good air flow, such as from an air conditioner or an opened window, weather permitting.  Wash your hands often Wash your hands often and thoroughly with soap and water for at least 20 seconds. You can use an alcohol based hand sanitizer if soap and  water are not available and if your hands are not visibly dirty. Avoid touching your eyes, nose, and mouth with unwashed hands. Use disposable paper towels to dry your hands. If not available, use dedicated cloth towels and replace them when they become wet.  Wear a facemask and gloves Wear a disposable facemask at all times in the room and gloves when you touch or have contact with the patient's blood, body fluids, and/or secretions or excretions, such as sweat, saliva, sputum, nasal mucus, vomit, urine, or feces.  Ensure the mask fits over your nose and mouth tightly, and do not touch it during use. Throw out disposable facemasks and gloves after using them. Do not reuse. Wash your hands immediately after removing your facemask and gloves. If your personal clothing becomes contaminated, carefully remove clothing and launder. Wash your hands after handling contaminated clothing. Place all used disposable facemasks, gloves, and other waste in a lined container before disposing them with other household waste. Remove gloves and wash your hands immediately after handling these items.  Do not share dishes, glasses, or other household items with the patient Avoid sharing household items. You should not share dishes, drinking glasses, cups, eating utensils, towels, bedding, or other items with a patient who is confirmed to  have, or being evaluated for, COVID-19 infection. After the person uses these items, you should wash them thoroughly with soap and water.  Wash laundry thoroughly Immediately remove and wash clothes or bedding that have blood, body fluids, and/or secretions or excretions, such as sweat, saliva, sputum, nasal mucus, vomit, urine, or feces, on them. Wear gloves when handling laundry from the patient. Read and follow directions on labels of laundry or clothing items and detergent. In general, wash and dry with the warmest temperatures recommended on the label.  Clean all areas the  individual has used often Clean all touchable surfaces, such as counters, tabletops, doorknobs, bathroom fixtures, toilets, phones, keyboards, tablets, and bedside tables, every day. Also, clean any surfaces that may have blood, body fluids, and/or secretions or excretions on them. Wear gloves when cleaning surfaces the patient has come in contact with. Use a diluted bleach solution (e.g., dilute bleach with 1 part bleach and 10 parts water) or a household disinfectant with a label that says EPA-registered for coronaviruses. To make a bleach solution at home, add 1 tablespoon of bleach to 1 quart (4 cups) of water. For a larger supply, add  cup of bleach to 1 gallon (16 cups) of water. Read labels of cleaning products and follow recommendations provided on product labels. Labels contain instructions for safe and effective use of the cleaning product including precautions you should take when applying the product, such as wearing gloves or eye protection and making sure you have good ventilation during use of the product. Remove gloves and wash hands immediately after cleaning.  Monitor yourself for signs and symptoms of illness Caregivers and household members are considered close contacts, should monitor their health, and will be asked to limit movement outside of the home to the extent possible. Follow the monitoring steps for close contacts listed on the symptom monitoring form.   ? If you have additional questions, contact your local health department or call the epidemiologist on call at [phone] (available 24/7). ? This guidance is subject to change. For the most up-to-date guidance from [facility], please refer to their website: YouBlogs.pl   Increase activity slowly   Complete by: As directed      Allergies as of 04/10/2019      Reactions   Requip [ropinirole Hcl] Shortness Of Breath, Nausea And Vomiting   Minocycline Hives    Tetracyclines & Related Hives   Zestril [lisinopril] Cough   Zetia [ezetimibe] Other (See Comments)   MYALGIA JOINT PAIN   Amoxicillin Rash   Did it involve swelling of the face/tongue/throat, SOB, or low BP? No Did it involve sudden or severe rash/hives, skin peeling, or any reaction on the inside of your mouth or nose? No Did you need to seek medical attention at a hospital or doctor's office? No When did it last happen?>10years If all above answers are "NO", may proceed with cephalosporin use.   Codeine Other (See Comments)   fatigue   Morphine Sulfate Other (See Comments)   Palpitations   Sulfa Antibiotics Other (See Comments)   Headache (pt states that a blood pressure medicine caused a severe headache but doesn't remember a reaction to sulfa)      Medication List    STOP taking these medications   carvedilol 25 MG tablet Commonly known as: COREG     TAKE these medications   acetaminophen 500 MG tablet Commonly known as: TYLENOL Take 1,000 mg by mouth every 6 (six) hours as needed for moderate pain.  benzonatate 100 MG capsule Commonly known as: TESSALON Take 1 capsule (100 mg total) by mouth 3 (three) times daily as needed for up to 7 days for cough. For up to 7 days   buPROPion 150 MG 24 hr tablet Commonly known as: WELLBUTRIN XL TAKE 1 TABLET BY MOUTH EVERY DAY IN THE MORNING What changed: See the new instructions.   CoQ10 100 MG Caps Take 300 mg by mouth daily. What changed: when to take this   dexamethasone 6 MG tablet Commonly known as: DECADRON Take 1 tablet (6 mg total) by mouth daily. Start taking on: April 11, 2019   docusate sodium 100 MG capsule Commonly known as: Colace Take 1 capsule (100 mg total) by mouth 2 (two) times daily.   DULoxetine 30 MG capsule Commonly known as: CYMBALTA TAKE ONE CAPSULE 3 TIMES A DAY What changed: See the new instructions.   Eliquis 5 MG Tabs tablet Generic drug: apixaban TAKE 1 TABLET BY MOUTH  TWICE A DAY What changed: how much to take   ferrous sulfate 325 (65 FE) MG tablet Take 1 tablet 2 x /day with Meals for Low Iron What changed:   how much to take  how to take this  when to take this  additional instructions   furosemide 20 MG tablet Commonly known as: LASIX Take 1 tablet (20 mg total) by mouth daily. Start taking on: April 11, 2019 What changed:   how much to take  how to take this  when to take this  additional instructions   gabapentin 300 MG capsule Commonly known as: NEURONTIN Take 1 capsule (300 mg total) by mouth 2 (two) times daily.   ipratropium 0.03 % nasal spray Commonly known as: ATROVENT Place 2 sprays into the nose 3 (three) times daily. What changed:   when to take this  reasons to take this   ketoconazole 2 % shampoo Commonly known as: NIZORAL Apply 1 application topically 2 (two) times a week. What changed:   when to take this  reasons to take this   levothyroxine 50 MCG tablet Commonly known as: SYNTHROID Take 1/2 to 1 tablet daily as directed on an empty stomach with only water for 30 minutes & no Antacid meds, Calcium or Magnesium for 4 hours & avoid Biotin What changed:   how much to take  how to take this  when to take this  additional instructions   losartan 100 MG tablet Commonly known as: COZAAR TAKE 1 TABLET BY MOUTH EVERY DAY REPLACES VALSARTAN What changed: See the new instructions.   metoprolol tartrate 25 MG tablet Commonly known as: LOPRESSOR Take 1 tablet (25 mg total) by mouth 2 (two) times daily.   mometasone 50 MCG/ACT nasal spray Commonly known as: NASONEX INSTILL 1-2 SPRAYS INTO EACH NOSTRIL TWO TIMES A DAY What changed:   how much to take  how to take this  when to take this  additional instructions   nystatin ointment Commonly known as: MYCOSTATIN Apply 1 application topically 2 (two) times daily. What changed:   when to take this  reasons to take this   omeprazole  40 MG capsule Commonly known as: PRILOSEC Take 1 capsule twice daily for Acid Indigestion & Reflux. What changed:   how much to take  how to take this  when to take this  additional instructions   rosuvastatin 40 MG tablet Commonly known as: CRESTOR TAKE 1 TABLET EVERY DAY, EXCEPT TAKE 2 TABLETS ON MONDAY What changed: See the  new instructions.   temazepam 30 MG capsule Commonly known as: RESTORIL TAKE 1 CAPSULE BY MOUTH AT BEDTIME ONLY IF NEEDED What changed: See the new instructions.   triamcinolone ointment 0.1 % Commonly known as: KENALOG Apply 1 application topically 2 (two) times daily. What changed:   when to take this  reasons to take this   Vitamin D 50 MCG (2000 UT) tablet Take 4,000 Units by mouth daily.      Allergies  Allergen Reactions  . Requip [Ropinirole Hcl] Shortness Of Breath and Nausea And Vomiting  . Minocycline Hives  . Tetracyclines & Related Hives  . Zestril [Lisinopril] Cough  . Zetia [Ezetimibe] Other (See Comments)    MYALGIA JOINT PAIN  . Amoxicillin Rash    Did it involve swelling of the face/tongue/throat, SOB, or low BP? No Did it involve sudden or severe rash/hives, skin peeling, or any reaction on the inside of your mouth or nose? No Did you need to seek medical attention at a hospital or doctor's office? No When did it last happen?>10years If all above answers are "NO", may proceed with cephalosporin use.   . Codeine Other (See Comments)    fatigue  . Morphine Sulfate Other (See Comments)    Palpitations  . Sulfa Antibiotics Other (See Comments)    Headache (pt states that a blood pressure medicine caused a severe headache but doesn't remember a reaction to sulfa)   Follow-up Information    [facility] Follow up.   Specialty: [place] Why: The office will call to schedule a visit for physical therapy in your home. Contact information: [place]  [phone]        Leonie Man, MD. Schedule an appointment as soon as possible for a visit in 3 week(s).   Specialty: Cardiology Contact information: [address] [place] [address] [phone]        Unk Pinto, MD. Schedule an appointment as soon as possible for a visit in 3 week(s).   Specialty: Internal Medicine Contact information: [address] [place] [address] [phone]            The results of significant diagnostics from this hospitalization (including imaging, microbiology, ancillary and laboratory) are listed below for reference.    Significant Diagnostic Studies: DG Chest Port 1 View  Result Date: 04/04/2019 CLINICAL DATA:  73 year old who tested positive for COVID-19 today, complaining of intermittent fevers over the past 5 days. Bradycardia with heart rate in the 40s at urgent care earlier today. EXAM: PORTABLE CHEST 1 VIEW COMPARISON:  09/12/2016 and earlier. FINDINGS: Cardiomediastinal silhouette unremarkable and unchanged. Minimal scarring in the INFERIOR LEFT UPPER LOBE, unchanged. Lungs otherwise clear. No localized airspace consolidation. No pleural effusions. No pneumothorax. Normal pulmonary vascularity. IMPRESSION: 1. No acute cardiopulmonary disease. 2. Stable scarring in the INFERIOR LEFT UPPER LOBE. Electronically Signed   By: Evangeline Dakin M.D.   On: 04/04/2019 14:08   ECHOCARDIOGRAM LIMITED  Result Date: 04/05/2019   ECHOCARDIOGRAM LIMITED REPORT   Patient Name:   [name] [name] Date of Exam: 04/05/2019 Medical Rec #:  [id number]         Height:       67.0 in Accession #:    [id number]        Weight:       250.0 lb Date of Birth:  24-May-1945        BSA:  2.22 m Patient Age:    1 years          BP:           155/58 mmHg Patient Gender: F                 HR:           93 bpm. Exam Location:  Inpatient  Procedure: Limited Echo, Limited Color Doppler and Cardiac Doppler  Indications:    Congestive Heart Failure I50.9  History:        Patient has prior history of Echocardiogram examinations, most                 recent 10/22/2015.  Sonographer:    Mikki Santee RDCS (AE) Referring Phys: [id number] Michell Heinrich PAHWANI IMPRESSIONS  1. Left ventricular ejection fraction, by visual estimation, is 60 to 65%. There is no increased left ventricular wall thickness.  2. Abnormal septal motion consistent with left bundle branch block.  3. Left ventricular diastolic parameters are consistent with Grade I diastolic dysfunction (impaired relaxation).  4. Global right ventricle has normal systolc function.The right ventricular size is normal. no increase in right ventricular wall thickness.  5. Left atrial size was mildly dilated.  6. The mitral valve is normal in structure. Trivial mitral valve regurgitation.  7. The pulmonic valve was normal in structure.  8. Normal pulmonary artery systolic pressure.  9. Frequent PVCs noted 10. The inferior vena cava is normal in size with greater than 50% respiratory variability, suggesting right atrial pressure of 3 mmHg. 11. The tricuspid valve was normal in structure. Tricuspid valve regurgitation is trivial. 12. Tricuspid valve regurgitation is trivial. FINDINGS  Left Ventricle: Left ventricular ejection fraction, by visual estimation, is 60 to 65%. There is no increased left ventricular wall thickness. Abnormal (paradoxical) septal motion, consistent with left bundle branch block. Left ventricular diastolic parameters are consistent with Grade I diastolic dysfunction (impaired relaxation). Right Ventricle: The right ventricular size is normal. No increase in right ventricular wall thickness. Global RV systolic function is has normal systolic function. The tricuspid regurgitant velocity is 2.05 m/s, and with an assumed right atrial pressure  of 3 mmHg, the estimated right ventricular systolic pressure is normal at 19.8 mmHg. Left Atrium: Left atrial size was  mildly dilated. Pericardium: There is no evidence of pericardial effusion is seen. There is no evidence of pericardial effusion. Mitral Valve: The mitral valve is normal in structure. MV Area by PHT, 4.06 cm. MV PHT, 54.23 msec. Trivial mitral valve regurgitation. Tricuspid Valve: The tricuspid valve is normal in structure. Tricuspid valve regurgitation is trivial. Pulmonic Valve: The pulmonic valve was normal in structure. Pulmonic valve regurgitation is trivial by color flow Doppler. Pulmonic regurgitation is trivial by color flow Doppler. Venous: The inferior vena cava is normal in size with greater than 50% respiratory variability, suggesting right atrial pressure of 3 mmHg.  LEFT VENTRICLE          Normals PLAX 2D LVIDd:         4.20 cm  3.6 cm   Diastology                 Normals LVIDs:         3.10 cm  1.7 cm   LV e' lateral:   6.20 cm/s 6.42 cm/s LV PW:         0.90 cm  1.4 cm   LV E/e' lateral: 10.8      15.4 LV  IVS:        1.00 cm  1.3 cm   LV e' medial:    7.72 cm/s 6.96 cm/s LVOT diam:     2.20 cm  2.0 cm   LV E/e' medial:  8.7       6.96 LV SV:         41 ml    79 ml LV SV Index:   17.17    45 ml/m2 LVOT Area:     3.80 cm 3.14 cm2  LEFT ATRIUM             Index LA diam:        3.30 cm 1.48 cm/m LA Vol (A2C):   46.0 ml 20.69 ml/m LA Vol (A4C):   40.2 ml 18.08 ml/m LA Biplane Vol: 43.8 ml 19.70 ml/m   AORTA                 Normals Ao Root diam: 2.80 cm 31 mm MITRAL VALVE              Normals   TRICUSPID VALVE             Normals MV Area (PHT): 4.06 cm             TR Peak grad:   16.8 mmHg MV PHT:        54.23 msec 55 ms     TR Vmax:        240.00 cm/s 288 cm/s MV Decel Time: 187 msec   187 ms MV E velocity: 67.20 cm/s 103 cm/s  SHUNTS MV A velocity: 88.30 cm/s 70.3 cm/s Systemic Diam: 2.20 cm MV E/A ratio:  0.76       1.5  Glori Bickers MD Electronically signed by Glori Bickers MD Signature Date/Time: 04/05/2019/3:34:33 PMThe mitral valve is normal in structure.    Final      Microbiology: Recent Results (from the past 240 hour(s))  Culture, blood (Routine X 2) w Reflex to ID Panel     Status: None   Collection Time: 04/04/19  5:19 PM   Specimen: BLOOD RIGHT FOREARM  Result Value Ref Range Status   Specimen Description BLOOD RIGHT FOREARM  Final   Special Requests   Final    BOTTLES DRAWN AEROBIC AND ANAEROBIC Blood Culture results may not be optimal due to an inadequate volume of blood received in culture bottles   Culture   Final    NO GROWTH 5 DAYS Performed at [facility], [place] [address] [place], [place] [address]    Report Status 04/09/2019 FINAL  Final  Culture, blood (Routine X 2) w Reflex to ID Panel     Status: None (Preliminary result)   Collection Time: 04/05/19  5:38 AM   Specimen: BLOOD  Result Value Ref Range Status   Specimen Description BLOOD LEFT ARM  Final   Special Requests   Final    BOTTLES DRAWN AEROBIC ONLY Blood Culture results may not be optimal due to an inadequate volume of blood received in culture bottles   Culture   Final    NO GROWTH 4 DAYS Performed at [facility], [place] [address], [place], [place] [address]    Report Status PENDING  Incomplete     Labs: Basic Metabolic Panel: Recent Labs  Lab 04/04/19 1251 04/05/19 0500 04/05/19 1347 04/06/19 0500 04/07/19 0500 04/08/19 0728 04/09/19 0419  NA 138 142 138 139 141 139 141  K 2.9* 2.8* 4.7  3.2* 4.1 3.4* 3.5  CL 102 104 106 107 110 103 108  CO2 23 26 23 25 24 26 25   GLUCOSE 104* 88 135* 109* 114* 101* 121*  BUN 18 18 17 14 14  26* 24*  CREATININE 2.19* 2.03* 1.71* 1.57* 1.32* 1.18* 1.06*  CALCIUM 8.5* 8.4* 8.3* 8.5* 9.1 8.9 8.6*  MG 2.0 2.0  --   --  1.9  --   --   PHOS  --  3.4  --   --   --   --   --    Liver Function Tests: Recent Labs  Lab 04/04/19 1715 04/05/19 0500 04/06/19 0500 04/07/19 0500  AST 22 22 18 19   ALT 18 16 15 15   ALKPHOS 78 72 63 69  BILITOT 0.5 0.3 0.7 0.1*  PROT 6.1* 5.7* 5.6* 6.0*  ALBUMIN 3.4* 3.1* 2.9*  3.1*   No results for input(s): LIPASE, AMYLASE in the last 168 hours. No results for input(s): AMMONIA in the last 168 hours. CBC: Recent Labs  Lab 04/04/19 1251 04/04/19 1715 04/05/19 0500  WBC 6.2 5.7 4.5  NEUTROABS 4.9 4.0 3.1  HGB 11.6* 10.9* 10.7*  HCT 36.4 34.6* 33.6*  MCV 90.1 90.6 91.6  PLT 245 205 182   Cardiac Enzymes: No results for input(s): CKTOTAL, CKMB, CKMBINDEX, TROPONINI in the last 168 hours. BNP: BNP (last 3 results) Recent Labs    04/04/19 2350  BNP 155.2*    ProBNP (last 3 results) No results for input(s): PROBNP in the last 8760 hours.  CBG: Recent Labs  Lab 04/06/19 1620  GLUCAP 141*       Signed:  Oswald Hillock MD.  [facility] 04/10/2019, 11:20 AM

## 2019-04-10 NOTE — Progress Notes (Signed)
[name] to be D/C'd Home per MD order.  Discussed with the patient and all questions fully answered.  VSS, Skin clean, dry and intact without evidence of skin break down, no evidence of skin tears noted. IV catheter discontinued intact. Site without signs and symptoms of complications. Dressing and pressure applied.  An After Visit Summary was printed and given to the patient. Patient received prescription.  D/c education completed with patient/family including follow up instructions, medication list, d/c activities limitations if indicated, with other d/c instructions as indicated by MD - patient able to verbalize understanding, all questions fully answered.   Patient instructed to return to ED, call 911, or call MD for any changes in condition.   Patient escorted via [place], and D/C home via private auto.  Luci Bank 04/10/2019 3:37 PM

## 2019-04-10 NOTE — TOC Transition Note (Signed)
Transition of Care [facility]) - CM/SW Discharge Note   Patient Details  Name: [name] [name] MRN: [id number] Date of Birth: 1946/01/03  Transition of [facility]) CM/SW Contact:  Pollie Friar, RN Phone Number: 04/10/2019, 12:05 PM   Clinical Narrative:    Pt discharging to home with [facility] services through [facility] today. Cassie with [facility] aware of d/c.  Pt has transportation home.    Final next level of care: [place] Barriers to Discharge: No Barriers Identified   Patient Goals and CMS Choice Patient states their goals for this hospitalization and ongoing recovery are:: return to her home CMS Medicare.gov Compare Post Acute Care list provided to:: Patient Choice offered to / list presented to : Patient  Discharge Placement                       Discharge Plan and Services   Discharge Planning Services: CM Consult Post Acute Care Choice: Home Health          DME Arranged: N/A DME Agency: NA       HH Arranged: PT HH Agency: [facility]     Representative spoke with at [place] made aware of d/c  Social Determinants of Health (SDOH) Interventions     Readmission Risk Interventions No flowsheet data found.

## 2019-04-10 NOTE — Discharge Instructions (Signed)
Information on my medicine - ELIQUIS (apixaban)  Why was Eliquis prescribed for you? Eliquis was prescribed for you to reduce the risk of forming blood clots that can cause a stroke if you have a medical condition called atrial fibrillation (a type of irregular heartbeat) OR to reduce the risk of a blood clots forming after orthopedic surgery.  What do You need to know about Eliquis ? Take your Eliquis TWICE DAILY - one tablet in the morning and one tablet in the evening with or without food.  It would be best to take the doses about the same time each day.  If you have difficulty swallowing the tablet whole please discuss with your pharmacist how to take the medication safely.  Take Eliquis exactly as prescribed by your doctor and DO NOT stop taking Eliquis without talking to the doctor who prescribed the medication.  Stopping may increase your risk of developing a new clot or stroke.  Refill your prescription before you run out.  After discharge, you should have regular check-up appointments with your healthcare provider that is prescribing your Eliquis.  In the future your dose may need to be changed if your kidney function or weight changes by a significant amount or as you get older.  What do you do if you miss a dose? If you miss a dose, take it as soon as you remember on the same day and resume taking twice daily.  Do not take more than one dose of ELIQUIS at the same time.  Important Safety Information A possible side effect of Eliquis is bleeding. You should call your healthcare provider right away if you experience any of the following: ? Bleeding from an injury or your nose that does not stop. ? Unusual colored urine (red or dark brown) or unusual colored stools (red or black). ? Unusual bruising for unknown reasons. ? A serious fall or if you hit your head (even if there is no bleeding).  Some medicines may interact with Eliquis and might increase your risk of bleeding  or clotting while [name]. To help avoid this, consult your healthcare provider or pharmacist prior to using any new prescription or non-prescription medications, including herbals, vitamins, non-steroidal anti-inflammatory drugs (NSAIDs) and supplements.  This website has more information [name] (apixaban): [email or web address].                                                                                   Quarantine till 04/25/19    Person Under Monitoring Name: [name] [name]  Location: [address] [place] [address]   Infection Prevention Recommendations for Individuals Confirmed to have, or Being Evaluated for, 2019 Novel Coronavirus (COVID-19) Infection Who Receive Care at Home  Individuals who are confirmed to have, or are being evaluated for, COVID-19 should follow the prevention steps below until a healthcare provider or local or state health department says they can return to normal activities.  Stay home except to get medical care You should restrict activities outside your home, except for getting medical care. Do not go to work, school, or public areas, and do not use public transportation or taxis.  Call ahead before  visiting your doctor Before your medical appointment, call the healthcare provider and tell them that you have, or are being evaluated for, COVID-19 infection. This will help the healthcare provider's office take steps to keep other people from getting infected. Ask your healthcare provider to call the local or state health department.  Monitor your symptoms Seek prompt medical attention if your illness is worsening (e.g., difficulty breathing). Before going to your medical appointment, call the healthcare provider and tell them that you have, or are being evaluated for, COVID-19 infection. Ask your healthcare provider to call the local or state health department.  Wear a facemask You should wear a facemask that covers your nose  and mouth when you are in the same room with other people and when you visit a healthcare provider. People who live with or visit you should also wear a facemask while they are in the same room with you.  Separate yourself from other people in your home As much as possible, you should stay in a different room from other people in your home. Also, you should use a separate bathroom, if available.  Avoid sharing household items You should not share dishes, drinking glasses, cups, eating utensils, towels, bedding, or other items with other people in your home. After using these items, you should wash them thoroughly with soap and water.  Cover your coughs and sneezes Cover your mouth and nose with a tissue when you cough or sneeze, or you can cough or sneeze into your sleeve. Throw used tissues in a lined trash can, and immediately wash your hands with soap and water for at least 20 seconds or use an alcohol-based hand rub.  Wash your [facility] your hands often and thoroughly with soap and water for at least 20 seconds. You can use an alcohol-based hand sanitizer if soap and water are not available and if your hands are not visibly dirty. Avoid touching your eyes, nose, and mouth with unwashed hands.   Prevention Steps for Caregivers and Household Members of Individuals Confirmed to have, or Being Evaluated for, COVID-19 Infection Being Cared for in the Home  If you live with, or provide care at home for, a person confirmed to have, or being evaluated for, COVID-19 infection please follow these guidelines to prevent infection:  Follow healthcare provider's instructions Make sure that you understand and can help the patient follow any healthcare provider instructions for all care.  Provide for the patient's basic needs You should help the patient with basic needs in the home and provide support for getting groceries, prescriptions, and other personal needs.  Monitor the patient's  symptoms If they are getting sicker, call his or her medical provider and tell them that the patient has, or is being evaluated for, COVID-19 infection. This will help the healthcare provider's office take steps to keep other people from getting infected. Ask the healthcare provider to call the local or state health department.  Limit the number of people who have contact with the patient  If possible, have only one caregiver for the patient.  Other household members should stay in another home or place of residence. If this is not possible, they should stay  in another room, or be separated from the patient as much as possible. Use a separate bathroom, if available.  Restrict visitors who do not have an essential need to be in the home.  Keep older adults, very young children, and other sick people away from the patient Keep older adults,  very young children, and those who have compromised immune systems or chronic health conditions away from the patient. This includes people with chronic heart, lung, or kidney conditions, diabetes, and cancer.  Ensure good ventilation Make sure that shared spaces in the home have good air flow, such as from an air conditioner or an opened window, weather permitting.  Wash your hands often  Wash your hands often and thoroughly with soap and water for at least 20 seconds. You can use an alcohol based hand sanitizer if soap and water are not available and if your hands are not visibly dirty.  Avoid touching your eyes, nose, and mouth with unwashed hands.  Use disposable paper towels to dry your hands. If not available, use dedicated cloth towels and replace them when they become wet.  Wear a facemask and gloves  Wear a disposable facemask at all times in the room and gloves when you touch or have contact with the patient's blood, body fluids, and/or secretions or excretions, such as sweat, saliva, sputum, nasal mucus, vomit, urine, or feces.  Ensure  the mask fits over your nose and mouth tightly, and do not touch it during use.  Throw out disposable facemasks and gloves after using them. Do not reuse.  Wash your hands immediately after removing your facemask and gloves.  If your personal clothing becomes contaminated, carefully remove clothing and launder. Wash your hands after handling contaminated clothing.  Place all used disposable facemasks, gloves, and other waste in a lined container before disposing them with other household waste.  Remove gloves and wash your hands immediately after handling these items.  Do not share dishes, glasses, or other household items with the patient  Avoid sharing household items. You should not share dishes, drinking glasses, cups, eating utensils, towels, bedding, or other items with a patient who is confirmed to have, or being evaluated for, COVID-19 infection.  After the person uses these items, you should wash them thoroughly with soap and water.  Wash laundry thoroughly  Immediately remove and wash clothes or bedding that have blood, body fluids, and/or secretions or excretions, such as sweat, saliva, sputum, nasal mucus, vomit, urine, or feces, on them.  Wear gloves when handling laundry from the patient.  Read and follow directions on labels of laundry or clothing items and detergent. In general, wash and dry with the warmest temperatures recommended on the label.  Clean all areas the individual has used often  Clean all touchable surfaces, such as counters, tabletops, doorknobs, bathroom fixtures, toilets, phones, keyboards, tablets, and bedside tables, every day. Also, clean any surfaces that may have blood, body fluids, and/or secretions or excretions on them.  Wear gloves when cleaning surfaces the patient has come in contact with.  Use a diluted bleach solution (e.g., dilute bleach with 1 part bleach and 10 parts water) or a household disinfectant with a label that says  EPA-registered for coronaviruses. To make a bleach solution at home, add 1 tablespoon of bleach to 1 quart (4 cups) of water. For a larger supply, add  cup of bleach to 1 gallon (16 cups) of water.  Read labels of cleaning products and follow recommendations provided on product labels. Labels contain instructions for safe and effective use of the cleaning product including precautions you should take when applying the product, such as wearing gloves or eye protection and making sure you have good ventilation during use of the product.  Remove gloves and wash hands immediately after cleaning.  Monitor yourself  for signs and symptoms of illness Caregivers and household members are considered close contacts, should monitor their health, and will be asked to limit movement outside of the home to the extent possible. Follow the monitoring steps for close contacts listed on the symptom monitoring form.   ? If you have additional questions, contact your local health department or call the epidemiologist on call at [phone] (available 24/7). ? This guidance is subject to change. For the most up-to-date guidance from [facility], please refer to their website: YouBlogs.pl

## 2019-04-11 ENCOUNTER — Telehealth: Payer: Self-pay | Admitting: Cardiology

## 2019-04-11 ENCOUNTER — Other Ambulatory Visit: Payer: Self-pay | Admitting: Physician Assistant

## 2019-04-11 ENCOUNTER — Telehealth: Payer: Self-pay | Admitting: *Deleted

## 2019-04-11 NOTE — Telephone Encounter (Signed)
I called patient to schedule follow up appointment with Dr. Ellyn Hack.  Patient states she has been hospitalized with COVID and has questions regarding her medications.

## 2019-04-11 NOTE — Telephone Encounter (Signed)
Pt just wanted to go over her medication list from the hospital stay. Stated that she is not taking carvedilol anymore and that makes her nervous. Advised pt to take BP and HR daily and to call the office if she notices a drastic change. Advised pt to keep appt with Dr Ellyn Hack on 04/30/19. Verbalized understanding.

## 2019-04-11 NOTE — Telephone Encounter (Signed)
I have attempted to contact this patient by phone with the following results: left message to return my call on answering machine.

## 2019-04-15 ENCOUNTER — Telehealth: Payer: Self-pay | Admitting: *Deleted

## 2019-04-15 NOTE — Telephone Encounter (Signed)
Called patient on 04/15/2019 , 9:33 AM in an attempt to reach the patient for a hospital follow up.   Admit date: 04/04/19 Discharge: 04/10/19   She does not have any questions or concerns about medications from the hospital admission. The patient's medications were reviewed over the phone, they were counseled to bring in all current medications to the hospital follow up visit.   I advised the patient to call if any questions or concerns arise about the hospital admission or medications    Home health was started in the hospital. The patient was contacted in regard to in home physical therapy, but she does not feel she it. All questions were answered and a follow up appointment was made. Se has a telephone visit on 04/17/2019 with Caryl Pina Corbett,NP.  Prior to Admission medications   Medication Sig Start Date End Date Taking? Authorizing Provider  acetaminophen (TYLENOL) 500 MG tablet Take 1,000 mg by mouth every 6 (six) hours as needed for moderate pain.    [provider]  benzonatate (TESSALON) 100 MG capsule Take 1 capsule (100 mg total) by mouth 3 (three) times daily as needed for up to 7 days for cough. For up to 7 days 04/10/19 04/17/19  Oswald Hillock, MD  buPROPion (WELLBUTRIN XL) 150 MG 24 hr tablet TAKE 1 TABLET BY MOUTH EVERY DAY IN THE MORNING Patient taking differently: Take 150 mg by mouth daily.  10/11/18   Vicie Mutters, PA-C  Cholecalciferol (VITAMIN D) 2000 UNITS tablet Take 4,000 Units by mouth daily.    [provider]  Coenzyme Q10 (COQ10) 100 MG CAPS Take 300 mg by mouth daily. Patient taking differently: Take 300 mg by mouth at bedtime.  06/07/17   Leonie Man, MD  dexamethasone (DECADRON) 6 MG tablet Take 1 tablet (6 mg total) by mouth daily. 04/11/19   Oswald Hillock, MD  docusate sodium (COLACE) 100 MG capsule Take 1 capsule (100 mg total) by mouth 2 (two) times daily. 09/23/16   Gary Fleet, PA-C  DULoxetine (CYMBALTA) 30 MG capsule TAKE ONE CAPSULE  3 TIMES A DAY Patient taking differently: Take 30 mg by mouth 2 (two) times daily.  10/11/18   Liane Comber, NP  ELIQUIS 5 MG TABS tablet TAKE 1 TABLET BY MOUTH TWICE A DAY Patient taking differently: Take 5 mg by mouth 2 (two) times daily.  01/29/19   Leonie Man, MD  ferrous sulfate 325 (65 FE) MG tablet Take 1 tablet 2 x /day with Meals for Low Iron Patient taking differently: Take 325 mg by mouth 2 (two) times daily with a meal.  01/10/19   Unk Pinto, MD  furosemide (LASIX) 20 MG tablet Take 1 tablet (20 mg total) by mouth daily. 04/11/19   Oswald Hillock, MD  gabapentin (NEURONTIN) 300 MG capsule Take 1 capsule (300 mg total) by mouth 2 (two) times daily. 07/17/18   Vicie Mutters, PA-C  ipratropium (ATROVENT) 0.03 % nasal spray Place 2 sprays into the nose 3 (three) times daily. Patient taking differently: Place 2 sprays into the nose daily as needed for rhinitis.  06/28/16 04/05/19  Unk Pinto, MD  ketoconazole (NIZORAL) 2 % shampoo Apply 1 application topically 2 (two) times a week. Patient taking differently: Apply 1 application topically daily as needed for irritation.  12/13/18   Liane Comber, NP  levothyroxine (SYNTHROID) 50 MCG tablet Take 1/2 to 1 tablet daily as directed on an empty stomach with only water for 30 minutes & no Antacid  meds, Calcium or Magnesium for 4 hours & avoid Biotin Patient taking differently: Take 50 mcg by mouth daily.  10/15/18   Unk Pinto, MD  losartan (COZAAR) 100 MG tablet TAKE 1 TABLET BY MOUTH EVERY DAY REPLACES VALSARTAN Patient taking differently: Take 100 mg by mouth daily.  03/14/18   Leonie Man, MD  metoprolol tartrate (LOPRESSOR) 25 MG tablet Take 1 tablet (25 mg total) by mouth 2 (two) times daily. 04/10/19   Oswald Hillock, MD  mometasone (NASONEX) 50 MCG/ACT nasal spray INSTILL 1-2 SPRAYS INTO EACH NOSTRIL TWO TIMES A DAY Patient taking differently: Place 1-2 sprays into the nose 2 (two) times daily.  03/30/19   Liane Comber, NP  nystatin ointment (MYCOSTATIN) Apply 1 application topically 2 (two) times daily. Patient taking differently: Apply 1 application topically 2 (two) times daily as needed (rash/yeast).  07/26/18   Vicie Mutters, PA-C  omeprazole (PRILOSEC) 40 MG capsule Take 1 capsule twice daily for Acid Indigestion & Reflux. Patient taking differently: Take 40 mg by mouth 2 (two) times daily.  10/23/18   Liane Comber, NP  rosuvastatin (CRESTOR) 40 MG tablet TAKE 1 TABLET EVERY DAY, EXCEPT TAKE 2 TABLETS ON MONDAY Patient taking differently: Take 40 mg by mouth at bedtime.  02/11/19   Leonie Man, MD  temazepam (RESTORIL) 30 MG capsule TAKE 1 CAPSULE BY MOUTH AT BEDTIME ONLY IF NEEDED 04/12/19   Liane Comber, NP  triamcinolone ointment (KENALOG) 0.1 % Apply 1 application topically 2 (two) times daily. Patient taking differently: Apply 1 application topically 2 (two) times daily as needed (rash).  07/26/18   Vicie Mutters, PA-C

## 2019-04-15 NOTE — Progress Notes (Signed)
Hospital follow up  Assessment and Plan: Hospital visit follow up for:   Jenisha was seen today for hospitalization follow-up.  Diagnoses and all orders for this visit:  Pneumonia due to COVID-19 virus Acute respiratory failure with hypoxia ([place]) Completed in patient antiviral; completed at home decadron taper, declines offer to extend slow taper; O2 sats maintaining at home; some expected fatigue without concerning accompaniments Continue to anticipate slow improvement Son and neighbors are checking on her frequently Will plan to follow up in office 04/25/2019 after released from quarantine to evaluate progress Contact office sooner if any concerns  AKI (acute kidney injury) ([place]) Improved at discharge; lasix was reduced Schedule OV 1/14 when released from quarantine and will follow up labs  Paroxysmal atrial fibrillation [facility]): CHA2DS2-VASc Score - starting Eliquis Chronic; Continue metoprolol, elequis Follow up scheduled with Dr. Ellyn Hack 04/30/2019  Chronic combined systolic and diastolic heart failure, NYHA class 2 ([place]) -- exacerbated by A. fib Weights down; no concerning symptoms Emphasized salt restriction, fluid restriction as recommended by cardiology Encouraged daily monitoring of the patient's weight, call office if 5 lb weight loss or gain in a day.  Encouraged regular exercise. If any increasing shortness of breath, swelling, or chest pressure go to ER immediately.  decrease your fluid intake to less than 2 L daily please remember to always increase your potassium intake with any increase of your fluid pill.   Essential hypertension Continue medications Monitor blood pressure at home; call if consistently over 130/80 Continue DASH diet.   Reminder to go to the ER if any CP, SOB, nausea, dizziness, severe HA, changes vision/speech, left arm numbness and tingling and jaw pain.  Hypokalemia Recheck at [place] on 04/25/2019; lasix was decreased  Bradycardia Improved with  change in BB; pulse normal today Has Holter scheduled with cardiology 04/30/2019 Contact office if pulse <50 with fatigue, dizziness, CP, dyspnea. ED for any syncope.   All medications were reviewed with patient and family and fully reconciled. All questions answered fully, and patient and family members were encouraged to call the office with any further questions or concerns. Discussed goal to avoid readmission related to this diagnosis.   Medications Discontinued During This Encounter  Medication Reason  . dexamethasone (DECADRON) 6 MG tablet Patient Preference    Over 40 minutes of exam, counseling, chart review, and complex, high/moderate level critical decision making was performed this visit.   Future Appointments  Date Time Provider [place]  04/25/2019  2:00 PM Liane Comber, NP [facility] None  04/30/2019  3:00 PM Leonie Man, MD [facility]  09/26/2019  3:00 PM Liane Comber, NP [facility] None  01/20/2020  2:00 PM Vicie Mutters, PA-C [facility] None     HPI 74 y.o.female presents for follow up for transition from recent hospitalization or SNIF stay. Admit date to the hospital was 04/04/19, patient was discharged from the hospital on 04/10/19 and our clinical staff contacted the office the day after discharge to set up a follow up appointment. The discharge summary, medications, and diagnostic test results were reviewed before meeting with the patient. The patient was admitted for:   Admit date: 04/04/2019 Discharge date: 12/30/202  Recommendations for Outpatient Follow-up:  1. Follow-up PCP in 3 weeks 2. Follow-up cardiology in 3 weeks for Holter monitoring as outpatient. 3. Need to quarantine till April 25, 2019.  Discharge Diagnoses:  Principal Problem:   Pneumonia due to COVID-19 virus Active Problems:   Hyperlipidemia   Hypertension   GERD   Paroxysmal atrial  fibrillation [facility]): CHA2DS2-VASc Score - starting Eliquis   Chronic  combined systolic (congestive) and diastolic (congestive) heart failure (HCC)   Morbid obesity (HCC)   CKD (chronic kidney disease) stage 3, GFR 30-59 ml/min   Bradycardia   Depression   Hypokalemia   AKI (acute kidney injury) ([place])   COVID-19 virus infection   Acute respiratory failure with hypoxia ([place])   Discharge Condition: Stable  Diet recommendation: Heart healthy diet     Filed Weights   04/04/19 1338  Weight: 113.4 kg    History of present illness:  74 year old female with history of hypertension, dyslipidemia, stage III CKD, paroxysmal atrial fibrillation, diastolic heart failure, depression, anxiety who had tested positive for COVID-19 December 24 at that time her symptoms included fever, cough congestion, myalgias, fatigue, weakness and decreased appetite. On December 24 she went to urgent care, her heart rate was in 79s. She was tested positive for COVID-19 and was referred to hospital for further evaluation. Found to have left upper low and lower lobe interstitial infiltrate.    Hospital Course:   1. *Acute hypoxic respiratory failure, due to COVID-19 viral pneumonia, patient is significantly improved. O2 sats 99% on room air, respiration 12/min. Patient was started on remdesivir on 04/06/2019.   She has completed 5 days of treatment with remdesivir.  Continue dexamethasone.  Will discharge on 5 more days of Decadron 6 mg p.o. daily.  2. Sinus bradycardia with bigeminy-cardiology was consulted, no intervention recommended. Heart rate is stable.Patient to follow-up with Dr. Ellyn Hack as outpatient for Holter monitoring.  3. Chronic combined systolic and diastolic CHF-we will switch from Coreg to metoprolol 25 twice daily as per cardiology recommendation. Also start low-dose furosemide 20 mg daily (per patient decreased from BID).   4. AKI with hypokalemia-improved, today serum creatinine is 106, potassium is 3.5. Patient baseline creatinine is  around 1.0  Lab Results  Component Value Date   NA 141 04/09/2019   K 3.5 04/09/2019   CL 108 04/09/2019   CO2 25 04/09/2019   GLUCOSE 121 (H) 04/09/2019   BUN 24 (H) 04/09/2019   CREATININE 1.06 (H) 04/09/2019   CALCIUM 8.6 (L) 04/09/2019   GFRAA >60 04/09/2019   GFRNONAA 52 (L) 04/09/2019   5. Dyslipidemia-continue rosuvastatin  6. GERD-continue pantoprazole.  7. Paroxysmal atrial fibrillation-Coreg is on hold due to patient history of bradycardia at the time of admission. Continue anticoagulation with Eliquis.  Started on metoprolol 25 mg p.o. twice daily as above.  8. Hypertension-we will restart losartan as outpatient, continue metoprolol as above.  8. Hypothyroidism-continue Synthroid.  9. Depression-continue bupropion, duloxetine, temazepam   Procedures:  Echocardiogram FINDINGS Left Ventricle: Left ventricular ejection fraction, by visual estimation, is 60 to 65%. There is no increased left ventricular wall thickness. Abnormal (paradoxical) septal motion, consistent with left bundle branch block. Left ventricular diastolic  parameters are consistent with Grade I diastolic dysfunction (impaired relaxation).  Right Ventricle: The right ventricular size is normal. No increase in right ventricular wall thickness. Global RV systolic function is has normal systolic function. The tricuspid regurgitant velocity is 2.05 m/s, and with an assumed right atrial pressure of 3 mmHg, the estimated right ventricular systolic pressure is normal at 19.8 mmHg.  Left Atrium: Left atrial size was mildly dilated.  Pericardium: There is no evidence of pericardial effusion is seen. There is no evidence of pericardial effusion.  Mitral Valve: The mitral valve is normal in structure. MV Area by PHT, 4.06 cm. MV PHT, 54.23 msec. Trivial mitral  valve regurgitation.  Tricuspid Valve: The tricuspid valve is normal in structure. Tricuspid valve regurgitation is  trivial.  Pulmonic Valve: The pulmonic valve was normal in structure. Pulmonic valve regurgitation is trivial by color flow Doppler. Pulmonic regurgitation is trivial by color flow Doppler.   Follow up 04/17/2019:  BP 140/66   Pulse 77   Wt 222 lb (100.7 kg)   SpO2 97%   BMI 34.77 kg/m   She was discharged on 5 more days of Decadron 6 mg p.o. daily, took last dose of this yesterday and reports continues to do well thus far.  She does have some mild intermittent cough, denies any secretions.  Does have a fair bit of fatigue, resting frequently, denies dizziness, shortness of breath. Will rest after climbing stairs. She is living by herself, son does call daily to check, neighbors will pick up anything she needs and leave on door step. Monitoring VS including O2 which has been stable 96+%.She will remain in quarantine until 04/25/2019. Supposed to return to work after that time but unsure if she will be ready considering fatigue.   She has a history of Combined Systolic and Diastolic CHF.  Denies dyspnea on exertion, orthopnea, paroxysmal nocturnal dyspnea and edema. Positive for fatigue. Taking lasix 20 mg daily. Weighs daily, automatically transmitted to [facility] for review.  Wt Readings from Last 3 Encounters:  04/17/19 222 lb (100.7 kg)  04/04/19 250 lb (113.4 kg)  01/17/19 243 lb (110.2 kg)   She will be following up with Dr. Ellyn Hack on 04/30/2019 for recommended Holter (sinus brady with bigeminy). Continues with elequis BID. Switched from Coreg to metoprolol 25 twice daily as per cardiology recommendation, pulse and BPs reportedly have been much improved, 70s.   Her blood pressure has been controlled at home, today their BP is BP: 140/66 She denies chest pain, shortness of breath, dizziness.  Home health is not involved.   Images while in the hospital: [facility] Chest Port 1 View  Result Date: 04/04/2019 CLINICAL DATA:  74 year old who tested positive for COVID-19 today,  complaining of intermittent fevers over the past 5 days. Bradycardia with heart rate in the 40s at urgent care earlier today. EXAM: PORTABLE CHEST 1 VIEW COMPARISON:  09/12/2016 and earlier. FINDINGS: Cardiomediastinal silhouette unremarkable and unchanged. Minimal scarring in the INFERIOR LEFT UPPER LOBE, unchanged. Lungs otherwise clear. No localized airspace consolidation. No pleural effusions. No pneumothorax. Normal pulmonary vascularity. IMPRESSION: 1. No acute cardiopulmonary disease. 2. Stable scarring in the INFERIOR LEFT UPPER LOBE. Electronically Signed   By: Evangeline Dakin M.D.   On: 04/04/2019 14:08   ECHOCARDIOGRAM LIMITED  Result Date: 04/05/2019   ECHOCARDIOGRAM LIMITED REPORT   Patient Name:   CACIE GOUDY Date of Exam: 04/05/2019 Medical Rec #:  [id number]         Height:       67.0 in Accession #:    [id number]        Weight:       250.0 lb Date of Birth:  [birth year]        BSA:          2.22 m Patient Age:    71 years          BP:           155/58 mmHg Patient Gender: F                 HR:           93 bpm. Exam  Location:  Inpatient  Procedure: Limited Echo, Limited Color Doppler and Cardiac Doppler Indications:    Congestive Heart Failure I50.9  History:        Patient has prior history of Echocardiogram examinations, most                 recent 10/22/2015.  Sonographer:    Mikki Santee RDCS (AE) Referring Phys: [id number] Michell Heinrich PAHWANI IMPRESSIONS  1. Left ventricular ejection fraction, by visual estimation, is 60 to 65%. There is no increased left ventricular wall thickness.  2. Abnormal septal motion consistent with left bundle branch block.  3. Left ventricular diastolic parameters are consistent with Grade I diastolic dysfunction (impaired relaxation).  4. Global right ventricle has normal systolc function.The right ventricular size is normal. no increase in right ventricular wall thickness.  5. Left atrial size was mildly dilated.  6. The mitral valve is normal in structure.  Trivial mitral valve regurgitation.  7. The pulmonic valve was normal in structure.  8. Normal pulmonary artery systolic pressure.  9. Frequent PVCs noted 10. The inferior vena cava is normal in size with greater than 50% respiratory variability, suggesting right atrial pressure of 3 mmHg. 11. The tricuspid valve was normal in structure. Tricuspid valve regurgitation is trivial. 12. Tricuspid valve regurgitation is trivial. FINDINGS  Left Ventricle: Left ventricular ejection fraction, by visual estimation, is 60 to 65%. There is no increased left ventricular wall thickness. Abnormal (paradoxical) septal motion, consistent with left bundle branch block. Left ventricular diastolic parameters are consistent with Grade I diastolic dysfunction (impaired relaxation). Right Ventricle: The right ventricular size is normal. No increase in right ventricular wall thickness. Global RV systolic function is has normal systolic function. The tricuspid regurgitant velocity is 2.05 m/s, and with an assumed right atrial pressure  of 3 mmHg, the estimated right ventricular systolic pressure is normal at 19.8 mmHg. Left Atrium: Left atrial size was mildly dilated. Pericardium: There is no evidence of pericardial effusion is seen. There is no evidence of pericardial effusion. Mitral Valve: The mitral valve is normal in structure. MV Area by PHT, 4.06 cm. MV PHT, 54.23 msec. Trivial mitral valve regurgitation. Tricuspid Valve: The tricuspid valve is normal in structure. Tricuspid valve regurgitation is trivial. Pulmonic Valve: The pulmonic valve was normal in structure. Pulmonic valve regurgitation is trivial by color flow Doppler. Pulmonic regurgitation is trivial by color flow Doppler. Venous: The inferior vena cava is normal in size with greater than 50% respiratory variability, suggesting right atrial pressure of 3 mmHg.  LEFT VENTRICLE          Normals PLAX 2D LVIDd:         4.20 cm  3.6 cm   Diastology                 Normals  LVIDs:         3.10 cm  1.7 cm   LV e' lateral:   6.20 cm/s 6.42 cm/s LV PW:         0.90 cm  1.4 cm   LV E/e' lateral: 10.8      15.4 LV IVS:        1.00 cm  1.3 cm   LV e' medial:    7.72 cm/s 6.96 cm/s LVOT diam:     2.20 cm  2.0 cm   LV E/e' medial:  8.7       6.96 LV SV:         41 ml  79 ml LV SV Index:   17.17    45 ml/m2 LVOT Area:     3.80 cm 3.14 cm2  LEFT ATRIUM             Index LA diam:        3.30 cm 1.48 cm/m LA Vol (A2C):   46.0 ml 20.69 ml/m LA Vol (A4C):   40.2 ml 18.08 ml/m LA Biplane Vol: 43.8 ml 19.70 ml/m   AORTA                 Normals Ao Root diam: 2.80 cm 31 mm MITRAL VALVE              Normals   TRICUSPID VALVE             Normals MV Area (PHT): 4.06 cm             TR Peak grad:   16.8 mmHg MV PHT:        54.23 msec 55 ms     TR Vmax:        240.00 cm/s 288 cm/s MV Decel Time: 187 msec   187 ms MV E velocity: 67.20 cm/s 103 cm/s  SHUNTS MV A velocity: 88.30 cm/s 70.3 cm/s Systemic Diam: 2.20 cm MV E/A ratio:  0.76       1.5  Glori Bickers MD Electronically signed by Glori Bickers MD Signature Date/Time: 04/05/2019/3:34:33 PMThe mitral valve is normal in structure.    Final      Current Outpatient Medications (Endocrine & Metabolic):  .  levothyroxine (SYNTHROID) 50 MCG tablet, Take 1/2 to 1 tablet daily as directed on an empty stomach with only water for 30 minutes & no Antacid meds, Calcium or Magnesium for 4 hours & avoid Biotin (Patient taking differently: Take 50 mcg by mouth daily. )  Current Outpatient Medications (Cardiovascular):  .  furosemide (LASIX) 20 MG tablet, Take 1 tablet (20 mg total) by mouth daily. Marland Kitchen  losartan (COZAAR) 100 MG tablet, TAKE 1 TABLET BY MOUTH EVERY DAY REPLACES VALSARTAN (Patient taking differently: Take 100 mg by mouth daily. ) .  metoprolol tartrate (LOPRESSOR) 25 MG tablet, Take 1 tablet (25 mg total) by mouth 2 (two) times daily. .  rosuvastatin (CRESTOR) 40 MG tablet, TAKE 1 TABLET EVERY DAY, EXCEPT TAKE 2 TABLETS ON MONDAY  (Patient taking differently: Take 40 mg by mouth at bedtime. )  Current Outpatient Medications (Respiratory):  .  benzonatate (TESSALON) 100 MG capsule, Take 1 capsule (100 mg total) by mouth 3 (three) times daily as needed for up to 7 days for cough. For up to 7 days .  ipratropium (ATROVENT) 0.03 % nasal spray, Place 2 sprays into the nose 3 (three) times daily. (Patient taking differently: Place 2 sprays into the nose daily as needed for rhinitis. ) .  mometasone (NASONEX) 50 MCG/ACT nasal spray, INSTILL 1-2 SPRAYS INTO EACH NOSTRIL TWO TIMES A DAY (Patient taking differently: Place 1-2 sprays into the nose 2 (two) times daily. )  Current Outpatient Medications (Analgesics):  .  acetaminophen (TYLENOL) 500 MG tablet, Take 1,000 mg by mouth every 6 (six) hours as needed for moderate pain.  Current Outpatient Medications (Hematological):  Marland Kitchen  ELIQUIS 5 MG TABS tablet, TAKE 1 TABLET BY MOUTH TWICE A DAY (Patient taking differently: Take 5 mg by mouth 2 (two) times daily. ) .  ferrous sulfate 325 (65 FE) MG tablet, Take 1 tablet 2 x /day with Meals for Low Iron (Patient  taking differently: Take 325 mg by mouth 2 (two) times daily with a meal. )  Current Outpatient Medications (Other):  Marland Kitchen  buPROPion (WELLBUTRIN XL) 150 MG 24 hr tablet, TAKE 1 TABLET BY MOUTH EVERY DAY IN THE MORNING (Patient taking differently: Take 150 mg by mouth daily. ) .  Cholecalciferol (VITAMIN D) 2000 UNITS tablet, Take 4,000 Units by mouth daily. .  Coenzyme Q10 (COQ10) 100 MG CAPS, Take 300 mg by mouth daily. (Patient taking differently: Take 300 mg by mouth at bedtime. ) .  docusate sodium (COLACE) 100 MG capsule, Take 1 capsule (100 mg total) by mouth 2 (two) times daily. .  DULoxetine (CYMBALTA) 30 MG capsule, TAKE ONE CAPSULE 3 TIMES A DAY (Patient taking differently: Take 30 mg by mouth 2 (two) times daily. ) .  gabapentin (NEURONTIN) 300 MG capsule, Take 1 capsule (300 mg total) by mouth 2 (two) times daily. Marland Kitchen   ketoconazole (NIZORAL) 2 % shampoo, Apply 1 application topically 2 (two) times a week. (Patient taking differently: Apply 1 application topically daily as needed for irritation. ) .  nystatin ointment (MYCOSTATIN), Apply 1 application topically 2 (two) times daily. (Patient taking differently: Apply 1 application topically 2 (two) times daily as needed (rash/yeast). ) .  omeprazole (PRILOSEC) 40 MG capsule, Take 1 capsule twice daily for Acid Indigestion & Reflux. (Patient taking differently: Take 40 mg by mouth 2 (two) times daily. ) .  temazepam (RESTORIL) 30 MG capsule, TAKE 1 CAPSULE BY MOUTH AT BEDTIME ONLY IF NEEDED .  triamcinolone ointment (KENALOG) 0.1 %, Apply 1 application topically 2 (two) times daily. (Patient taking differently: Apply 1 application topically 2 (two) times daily as needed (rash). )  Past Medical History:  Diagnosis Date  . Anemia   . Anxiety   . Arthritis   . Breast cancer ([place])     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  . Chronic back pain    cyst sitting on L4-5;slipped disc  . Depression    takes Cymbalta daily  . Diverticulosis   . Dysrhythmia   . GERD (gastroesophageal reflux disease)    takes Omeprazole daily  . Headache(784.0)    rare  . History of gastric ulcer at age 74  . History of pancreatitis 03/27/2008  . History of ulcer disease 08/06/2015  . Hyperlipidemia   . Hypertension    takes Cardura nightly and Verapamil daily  . Hypothyroidism   . Insomnia    takes Restoril nightly  . Nonischemic cardiomyopathy ([place]) 07/2015   Normal coronary arteries by cath. EF was 25-30% by echo. GR2 DD - likely related to LBBB in setting of A. fib  . Obesity (BMI 30-39.9) 06/11/2013  . Paroxysmal atrial fibrillation ([place]): CHA2DS2-VASc Score - starting Eliquis 08/06/2015   This patients CHA2DS2-VASc Score and unadjusted Ischemic Stroke Rate (% per year) is equal to 4.8 % stroke rate/year from a score of 4  Above score calculated as 1  point each if present [CHF, HTN, DM, Vascular=MI/PAD/Aortic Plaque, Age if 65-74, or Female]; 2 points each if present [Age > 75, or Stroke/TIA/TE]  . PONV (postoperative nausea and vomiting)   . S/P radiation therapy 07/18/2013-08/28/2013   1) Left Breast / 50 Gy in 25 fractions/ 2) Left Breast Boost / 10 Gy in 5 fractions  . Shingles      Allergies  Allergen Reactions  . Requip [Ropinirole Hcl] Shortness Of Breath and Nausea And Vomiting  . Minocycline Hives  . Tetracyclines & Related  Hives  . Zestril [Lisinopril] Cough  . Zetia [Ezetimibe] Other (See Comments)    MYALGIA JOINT PAIN  . Amoxicillin Rash    Did it involve swelling of the face/tongue/throat, SOB, or low BP? No Did it involve sudden or severe rash/hives, skin peeling, or any reaction on the inside of your mouth or nose? No Did you need to seek medical attention at a hospital or doctor's office? No When did it last happen?>10years If all above answers are "NO", may proceed with cephalosporin use.   . Codeine Other (See Comments)    fatigue  . Morphine Sulfate Other (See Comments)    Palpitations  . Sulfa Antibiotics Other (See Comments)    Headache (pt states that a blood pressure medicine caused a severe headache but doesn't remember a reaction to sulfa)    ROS: all negative except above.   Physical Exam: Filed Weights   04/17/19 1048  Weight: 222 lb (100.7 kg)   BP 140/66   Pulse 77   Wt 222 lb (100.7 kg)   SpO2 97%   BMI 34.77 kg/m   General : Well sounding patient in no apparent distress HEENT: no hoarseness, brief single episode of dry cough for duration of visit Lungs: speaks in complete sentences, no audible wheezing, no apparent distress Neurological: alert, oriented x 3 Psychiatric: pleasant, judgement appropriate   Izora Ribas, NP 11:12 AM [facility]

## 2019-04-17 ENCOUNTER — Ambulatory Visit (INDEPENDENT_AMBULATORY_CARE_PROVIDER_SITE_OTHER): Payer: Medicare Other | Admitting: Adult Health

## 2019-04-17 ENCOUNTER — Encounter: Payer: Self-pay | Admitting: Adult Health

## 2019-04-17 ENCOUNTER — Other Ambulatory Visit: Payer: Self-pay

## 2019-04-17 VITALS — BP 140/66 | HR 77 | Wt 222.0 lb

## 2019-04-17 DIAGNOSIS — R001 Bradycardia, unspecified: Secondary | ICD-10-CM

## 2019-04-17 DIAGNOSIS — I10 Essential (primary) hypertension: Secondary | ICD-10-CM

## 2019-04-17 DIAGNOSIS — J9601 Acute respiratory failure with hypoxia: Secondary | ICD-10-CM | POA: Diagnosis not present

## 2019-04-17 DIAGNOSIS — U071 COVID-19: Secondary | ICD-10-CM | POA: Diagnosis not present

## 2019-04-17 DIAGNOSIS — N179 Acute kidney failure, unspecified: Secondary | ICD-10-CM | POA: Diagnosis not present

## 2019-04-17 DIAGNOSIS — E876 Hypokalemia: Secondary | ICD-10-CM

## 2019-04-17 DIAGNOSIS — I480 Paroxysmal atrial fibrillation: Secondary | ICD-10-CM

## 2019-04-17 DIAGNOSIS — I5042 Chronic combined systolic (congestive) and diastolic (congestive) heart failure: Secondary | ICD-10-CM

## 2019-04-17 DIAGNOSIS — J1282 Pneumonia due to coronavirus disease 2019: Secondary | ICD-10-CM

## 2019-04-21 ENCOUNTER — Other Ambulatory Visit: Payer: Self-pay | Admitting: Cardiology

## 2019-04-24 NOTE — Progress Notes (Signed)
Assessment and Plan:  [name] [name] was seen today for follow-up.  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 She is recovering well excepting with notable residual fatigue She is monitoring O2 levels closely and maintaining Discussed to anticipate several weeks of gradual improvement with fatigue Very physical job working in [employer]; will write note to remain out through 05/12/2019 -     CBC with Diff  Chronic combined systolic (congestive) and diastolic (congestive) heart failure ([place]) Appears euvolemic; monitoring weights closely Keep close upcoming appointment with cardiology  -     COMPLETE METABOLIC PANEL WITH GFR -     Magnesium  AKI (acute kidney injury) (HCC)/ Stage 3 chronic kidney disease, unspecified whether stage 3a or 3b CKD Increase fluids, avoid NSAIDS, monitor sugars, will monitor -     COMPLETE METABOLIC PANEL WITH GFR -     Magnesium  Hypokalemia -     COMPLETE METABOLIC PANEL WITH GFR   Further diposition pending results of labs. Discussed med's effects and SE's.   Over 30 minutes of exam, counseling, chart review, and critical decision making was performed.   Future Appointments  Date Time Provider [place]  04/30/2019  3:00 PM Leonie Man, MD [facility]  09/26/2019  3:00 PM Liane Comber, NP [facility] None  01/20/2020  2:00 PM Vicie Mutters, PA-C [facility] None    ------------------------------------------------------------------------------------------------------------------   HPI BP 110/66   Pulse 99   Temp 97.9 F (36.6 C)   Wt 226 lb (102.5 kg)   SpO2 97%   BMI 35.40 kg/m   74 y.o.female presents for 2 week follow up in person after being released from quarantine from covid 19 infection complicated with hospitalization for pneumonia with respiratory failure 12/24-12/30/2020 after improvement with decadron and IV remdesivir. She was recovering fairly at hospital follow  up [place] on 04/17/2019 as per below. Presents for further follow up of progress and labs today.   She reports at home O2 is remaining pretty stable around 97% as long as she avoids stairs; reports very poor energy levels. She is able to get up and do ADLs with frequent rest. She reports getting lightly headed and dizzy if she gets up quickly or bends. Occasional mild headache well controlled by tylenol. No fevers.   She has a history of Combined Systolic and Diastolic CHF Denies dyspnea on exertion, orthopnea, paroxysmal nocturnal dyspnea and edema. Positive for fatigue. Taking lasix 20 mg daily. Weighs daily, automatically transmitted to [facility] for review.  Wt Readings from Last 3 Encounters:  04/25/19 226 lb (102.5 kg)  04/17/19 222 lb (100.7 kg)  04/04/19 250 lb (113.4 kg)   She will be following up with Dr. Ellyn Hack on 04/30/2019 for recommended Holter (sinus brady with bigeminy). Continues with elequis BID. Switched from Coreg to metoprolol 25 twice daily as per cardiology recommendation, pulse and BPs reportedly have been much improved, 70s.  Her blood pressure has been controlled at home, today their BP is BP: 110/66  She does not workout. She denies chest pain, shortness of breath; she does endorse some dizziness with position changes.   She has been pushing fluids, carrying a cup of ice water 20 oz x 3-4 daily, also 1/2 bottle of gatorade Lab Results  Component Value Date   NA 141 04/09/2019   K 3.5 04/09/2019   CL 108 04/09/2019   CO2 25 04/09/2019   GLUCOSE 121 (H) 04/09/2019   BUN 24 (H) 04/09/2019  CREATININE 1.06 (H) 04/09/2019   CALCIUM 8.6 (L) 04/09/2019   GFRAA >60 04/09/2019   GFRNONAA 52 (L) 04/09/2019     Follow up 04/17/2019:  She was discharged on 5 more days of Decadron 6 mg p.o. daily, took last dose of this yesterday and reports continues to do well thus far.  She does have some mild intermittent cough, denies any secretions.  Does have a fair bit of  fatigue, resting frequently, denies dizziness, shortness of breath. Will rest after climbing stairs. She is living by herself, son does call daily to check, neighbors will pick up anything she needs and leave on door step. Monitoring VS including O2 which has been stable 96+%.She will remain in quarantine until 04/25/2019. Supposed to return to work after that time but unsure if she will be ready considering fatigue.   She has a history of Combined Systolic and Diastolic CHF.  Denies dyspnea on exertion, orthopnea, paroxysmal nocturnal dyspnea and edema. Positive for fatigue. Taking lasix 20 mg daily. Weighs daily, automatically transmitted to [facility] for review.  Wt Readings from Last 3 Encounters:  04/25/19 226 lb (102.5 kg)  04/17/19 222 lb (100.7 kg)  04/04/19 250 lb (113.4 kg)      Past Medical History:  Diagnosis Date  . Anemia   . Anxiety   . Arthritis   . Breast cancer ([place])     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  . Chronic back pain    cyst sitting on L4-5;slipped disc  . Depression    takes Cymbalta daily  . Diverticulosis   . Dysrhythmia   . GERD (gastroesophageal reflux disease)    takes Omeprazole daily  . Headache(784.0)    rare  . History of gastric ulcer at age 51  . History of pancreatitis 03/27/2008  . History of ulcer disease 08/06/2015  . Hyperlipidemia   . Hypertension    takes Cardura nightly and Verapamil daily  . Hypothyroidism   . Insomnia    takes Restoril nightly  . Nonischemic cardiomyopathy ([place]) 07/2015   Normal coronary arteries by cath. EF was 25-30% by echo. GR2 DD - likely related to LBBB in setting of A. fib  . Obesity (BMI 30-39.9) 06/11/2013  . Paroxysmal atrial fibrillation ([place]): CHA2DS2-VASc Score - starting Eliquis 08/06/2015   This patients CHA2DS2-VASc Score and unadjusted Ischemic Stroke Rate (% per year) is equal to 4.8 % stroke rate/year from a score of 4  Above score calculated as 1 point each if  present [CHF, HTN, DM, Vascular=MI/PAD/Aortic Plaque, Age if 65-74, or Female]; 2 points each if present [Age > 75, or Stroke/TIA/TE]  . PONV (postoperative nausea and vomiting)   . S/P radiation therapy 07/18/2013-08/28/2013   1) Left Breast / 50 Gy in 25 fractions/ 2) Left Breast Boost / 10 Gy in 5 fractions  . Shingles      Allergies  Allergen Reactions  . Requip [Ropinirole Hcl] Shortness Of Breath and Nausea And Vomiting  . Minocycline Hives  . Tetracyclines & Related Hives  . Zestril [Lisinopril] Cough  . Zetia [Ezetimibe] Other (See Comments)    MYALGIA JOINT PAIN  . Amoxicillin Rash    Did it involve swelling of the face/tongue/throat, SOB, or low BP? No Did it involve sudden or severe rash/hives, skin peeling, or any reaction on the inside of your mouth or nose? No Did you need to seek medical attention at a hospital or doctor's office? No When did it last happen?>10years If  all above answers are "NO", may proceed with cephalosporin use.   . Codeine Other (See Comments)    fatigue  . Morphine Sulfate Other (See Comments)    Palpitations  . Sulfa Antibiotics Other (See Comments)    Headache (pt states that a blood pressure medicine caused a severe headache but doesn't remember a reaction to sulfa)    Current Outpatient Medications on File Prior to Visit  Medication Sig  . acetaminophen (TYLENOL) 500 MG tablet Take 1,000 mg by mouth every 6 (six) hours as needed for moderate pain.  Marland Kitchen buPROPion (WELLBUTRIN XL) 150 MG 24 hr tablet TAKE 1 TABLET BY MOUTH EVERY DAY IN THE MORNING (Patient taking differently: Take 150 mg by mouth daily. )  . Cholecalciferol (VITAMIN D) 2000 UNITS tablet Take 4,000 Units by mouth daily.  . Coenzyme Q10 (COQ10) 100 MG CAPS Take 300 mg by mouth daily. (Patient taking differently: Take 300 mg by mouth at bedtime. )  . docusate sodium (COLACE) 100 MG capsule Take 1 capsule (100 mg total) by mouth 2 (two) times daily.  . DULoxetine (CYMBALTA) 30 MG  capsule TAKE ONE CAPSULE 3 TIMES A DAY (Patient taking differently: Take 30 mg by mouth 2 (two) times daily. )  . ELIQUIS 5 MG TABS tablet TAKE 1 TABLET BY MOUTH TWICE A DAY (Patient taking differently: Take 5 mg by mouth 2 (two) times daily. )  . ferrous sulfate 325 (65 FE) MG tablet Take 1 tablet 2 x /day with Meals for Low Iron (Patient taking differently: Take 325 mg by mouth 2 (two) times daily with a meal. )  . furosemide (LASIX) 20 MG tablet Take 1 tablet (20 mg total) by mouth daily.  Marland Kitchen gabapentin (NEURONTIN) 300 MG capsule Take 1 capsule (300 mg total) by mouth 2 (two) times daily.  Marland Kitchen ketoconazole (NIZORAL) 2 % shampoo Apply 1 application topically 2 (two) times a week. (Patient taking differently: Apply 1 application topically daily as needed for irritation. )  . levothyroxine (SYNTHROID) 50 MCG tablet Take 1/2 to 1 tablet daily as directed on an empty stomach with only water for 30 minutes & no Antacid meds, Calcium or Magnesium for 4 hours & avoid Biotin (Patient taking differently: Take 50 mcg by mouth daily. )  . losartan (COZAAR) 100 MG tablet Take 1 tablet (100 mg total) by mouth daily.  . metoprolol tartrate (LOPRESSOR) 25 MG tablet Take 1 tablet (25 mg total) by mouth 2 (two) times daily.  . mometasone (NASONEX) 50 MCG/ACT nasal spray INSTILL 1-2 SPRAYS INTO EACH NOSTRIL TWO TIMES A DAY (Patient taking differently: Place 1-2 sprays into the nose 2 (two) times daily. )  . nystatin ointment (MYCOSTATIN) Apply 1 application topically 2 (two) times daily. (Patient taking differently: Apply 1 application topically 2 (two) times daily as needed (rash/yeast). )  . omeprazole (PRILOSEC) 40 MG capsule Take 1 capsule twice daily for Acid Indigestion & Reflux. (Patient taking differently: Take 40 mg by mouth 2 (two) times daily. )  . rosuvastatin (CRESTOR) 40 MG tablet TAKE 1 TABLET EVERY DAY, EXCEPT TAKE 2 TABLETS ON MONDAY (Patient taking differently: Take 40 mg by mouth at bedtime. )  .  temazepam (RESTORIL) 30 MG capsule TAKE 1 CAPSULE BY MOUTH AT BEDTIME ONLY IF NEEDED  . triamcinolone ointment (KENALOG) 0.1 % Apply 1 application topically 2 (two) times daily. (Patient taking differently: Apply 1 application topically 2 (two) times daily as needed (rash). )  . ipratropium (ATROVENT) 0.03 % nasal spray  Place 2 sprays into the nose 3 (three) times daily. (Patient taking differently: Place 2 sprays into the nose daily as needed for rhinitis. )   No current facility-administered medications on file prior to visit.    ROS: all negative except above.   Physical Exam:  BP 110/66   Pulse 99   Temp 97.9 F (36.6 C)   Wt 226 lb (102.5 kg)   SpO2 97%   BMI 35.40 kg/m   General Appearance: Pale/fatigued appearing adult female, well dressed, in no acute distress. Eyes: PERRLA, conjunctiva no swelling or erythema Sinuses: No Frontal/maxillary tenderness ENT/Mouth: Ext aud canals clear, TMs without erythema, bulging. Mask in place; Hearing normal.  Neck: Supple, thyroid normal.  Respiratory: Respiratory effort normal, BS equal bilaterally without rales, rhonchi, wheezing or stridor.  Cardio: RRR with no MRGs. Brisk peripheral pulses without edema.  Abdomen: Soft, + BS.  Non tender, no guarding, rebound, hernias, masses. Lymphatics: Non tender without lymphadenopathy.  Musculoskeletal: No effusion or obvious deformity, symmetrical strength, slow steady gait.  Skin: Warm, dry without rashes, lesions, ecchymosis.  Neuro: Normal muscle tone, no cerebellar symptoms. Sensation intact.  Psych: Awake and oriented X 3, normal affect, Insight and Judgment appropriate.     Izora Ribas, NP 2:39 PM [facility]

## 2019-04-25 ENCOUNTER — Encounter: Payer: Self-pay | Admitting: Adult Health

## 2019-04-25 ENCOUNTER — Ambulatory Visit: Payer: Medicare Other | Admitting: Adult Health

## 2019-04-25 ENCOUNTER — Other Ambulatory Visit: Payer: Self-pay

## 2019-04-25 VITALS — BP 110/66 | HR 99 | Temp 97.9°F | Wt 226.0 lb

## 2019-04-25 DIAGNOSIS — I5042 Chronic combined systolic (congestive) and diastolic (congestive) heart failure: Secondary | ICD-10-CM

## 2019-04-25 DIAGNOSIS — G933 Postviral fatigue syndrome: Secondary | ICD-10-CM

## 2019-04-25 DIAGNOSIS — N179 Acute kidney failure, unspecified: Secondary | ICD-10-CM

## 2019-04-25 DIAGNOSIS — N183 Chronic kidney disease, stage 3 unspecified: Secondary | ICD-10-CM | POA: Diagnosis not present

## 2019-04-25 DIAGNOSIS — U071 COVID-19: Secondary | ICD-10-CM

## 2019-04-25 DIAGNOSIS — E876 Hypokalemia: Secondary | ICD-10-CM

## 2019-04-25 DIAGNOSIS — G9331 Postviral fatigue syndrome: Secondary | ICD-10-CM

## 2019-04-25 DIAGNOSIS — J1282 Pneumonia due to coronavirus disease 2019: Secondary | ICD-10-CM

## 2019-04-25 NOTE — Patient Instructions (Addendum)
   Can try cutting losartan in 1/2 to help with dizziness/fatigue - increase back up to whole tab if persistently running above 130/80    Still recommend getting the covid 19 vaccine as you CAN CONTRACT COVID 19 A SECOND TIME  YOU SHOULD WAIT 30 DAYS TO GET THIS DONE   Individuals seeking information about the vaccines and state's phased distribution plan can learn more by going to - [email or web address]  -[email or web address]   And continue to monitor the county's website and press releases.   And you can call the [facility] starting on Jan 8th at 8 AM to schedule for a vaccine. [phone].

## 2019-04-26 ENCOUNTER — Other Ambulatory Visit: Payer: Self-pay | Admitting: Adult Health

## 2019-04-26 ENCOUNTER — Encounter: Payer: Self-pay | Admitting: Adult Health

## 2019-04-26 DIAGNOSIS — E876 Hypokalemia: Secondary | ICD-10-CM

## 2019-04-26 LAB — COMPLETE METABOLIC PANEL WITH GFR
AG Ratio: 1.4 (calc) (ref 1.0–2.5)
ALT: 8 U/L (ref 6–29)
AST: 10 U/L (ref 10–35)
Albumin: 3.7 g/dL (ref 3.6–5.1)
Alkaline phosphatase (APISO): 95 U/L (ref 37–153)
BUN/Creatinine Ratio: 13 (calc) (ref 6–22)
BUN: 13 mg/dL (ref 7–25)
CO2: 29 mmol/L (ref 20–32)
Calcium: 9.2 mg/dL (ref 8.6–10.4)
Chloride: 102 mmol/L (ref 98–110)
Creat: 1 mg/dL — ABNORMAL HIGH (ref 0.60–0.93)
GFR, Est African American: 65 mL/min/{1.73_m2} (ref >=60)
GFR, Est Non African American: 56 mL/min/{1.73_m2} — ABNORMAL LOW (ref >=60)
Globulin: 2.6 g/dL (calc) (ref 1.9–3.7)
Glucose, Bld: 106 mg/dL — ABNORMAL HIGH (ref 65–99)
Potassium: 3.2 mmol/L — ABNORMAL LOW (ref 3.5–5.3)
Sodium: 141 mmol/L (ref 135–146)
Total Bilirubin: 0.4 mg/dL (ref 0.2–1.2)
Total Protein: 6.3 g/dL (ref 6.1–8.1)

## 2019-04-26 LAB — CBC WITH DIFFERENTIAL/PLATELET
Absolute Monocytes: 800 cells/uL (ref 200–950)
Basophils Absolute: 46 cells/uL (ref 0–200)
Basophils Relative: 0.5 %
Eosinophils Absolute: 193 cells/uL (ref 15–500)
Eosinophils Relative: 2.1 %
HCT: 31.6 % — ABNORMAL LOW (ref 35.0–45.0)
Hemoglobin: 10.2 g/dL — ABNORMAL LOW (ref 11.7–15.5)
Lymphs Abs: 1095 cells/uL (ref 850–3900)
MCH: 28.7 pg (ref 27.0–33.0)
MCHC: 32.3 g/dL (ref 32.0–36.0)
MCV: 89 fL (ref 80.0–100.0)
MPV: 11.4 fL (ref 7.5–12.5)
Monocytes Relative: 8.7 %
Neutro Abs: 7066 cells/uL (ref 1500–7800)
Neutrophils Relative %: 76.8 %
Platelets: 301 10*3/uL (ref 140–400)
RBC: 3.55 10*6/uL — ABNORMAL LOW (ref 3.80–5.10)
RDW: 13.3 % (ref 11.0–15.0)
Total Lymphocyte: 11.9 %
WBC: 9.2 10*3/uL (ref 3.8–10.8)

## 2019-04-26 LAB — MAGNESIUM: Magnesium: 1.9 mg/dL (ref 1.5–2.5)

## 2019-04-30 ENCOUNTER — Encounter: Payer: Self-pay | Admitting: Cardiology

## 2019-04-30 ENCOUNTER — Ambulatory Visit: Payer: Medicare Other | Admitting: Cardiology

## 2019-04-30 ENCOUNTER — Telehealth: Payer: Self-pay | Admitting: Radiology

## 2019-04-30 ENCOUNTER — Other Ambulatory Visit: Payer: Self-pay

## 2019-04-30 VITALS — BP 119/76 | HR 86 | Temp 95.7°F | Ht 65.0 in | Wt 228.0 lb

## 2019-04-30 DIAGNOSIS — Z8679 Personal history of other diseases of the circulatory system: Secondary | ICD-10-CM

## 2019-04-30 DIAGNOSIS — I480 Paroxysmal atrial fibrillation: Secondary | ICD-10-CM

## 2019-04-30 DIAGNOSIS — R001 Bradycardia, unspecified: Secondary | ICD-10-CM | POA: Diagnosis not present

## 2019-04-30 DIAGNOSIS — E876 Hypokalemia: Secondary | ICD-10-CM

## 2019-04-30 DIAGNOSIS — I493 Ventricular premature depolarization: Secondary | ICD-10-CM

## 2019-04-30 DIAGNOSIS — I428 Other cardiomyopathies: Secondary | ICD-10-CM

## 2019-04-30 DIAGNOSIS — E782 Mixed hyperlipidemia: Secondary | ICD-10-CM

## 2019-04-30 DIAGNOSIS — I5032 Chronic diastolic (congestive) heart failure: Secondary | ICD-10-CM

## 2019-04-30 MED ORDER — POTASSIUM CHLORIDE CRYS ER 20 MEQ PO TBCR: EXTENDED_RELEASE_TABLET | ORAL | 3 refills | Status: DC

## 2019-04-30 NOTE — Assessment & Plan Note (Signed)
Most recent potassium showed a drop to 3.2.  We will give potassium supplementation since he may be taking more Lasix. Exam plan will be to take 40 mg for 3 days and then 20 mg daily until we can recheck labs next week.  Further dosing will be based on follow-up labs.  However if she does take next dose of Lasix for swelling, she is to take an additional dose of potassium supplement.

## 2019-04-30 NOTE — Assessment & Plan Note (Signed)
Seems euvolemic, but has some abdominal swelling.  Was reduced to once daily Lasix in the hospital.  She may need more than that.  She did have some hypokalemia however so we will need to have supplemental potassium intake with additional Lasix.  Will allow for 1-2 dose of Lasix a day.

## 2019-04-30 NOTE — Progress Notes (Signed)
Primary Care Provider: Unk Pinto, MD Cardiologist: Glenetta Hew, MD Electrophysiologist:   Clinic Note: Chief Complaint  Patient presents with   Hospitalization Follow-up    Wheezing COVID-19 infection   Atrial Fibrillation    No sign of A. fib during infection, frequent PVCs.    HPI:    [name] [name] is a 73 y.o. female with a PMH follow-up for mild nonischemic cardiomyopathy with an EF of 45 to 50% with some diastolic dysfunction-NYHA class II CHF symptoms with LBBB and PAF ([name]), as well as CKD-3 who presents today for 54-month and hospital follow-up.  [name] [name] was last seen on October 29, 2018 by Odie Sera, NP.  She is feeling well with no major issues.  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  Walks her dog every 2 hours for about 15 minutes of time when she is at home.  Maybe once or twice a month she has some short periods of fluttering.  Recent Hospitalizations:   Admitted from December 24-30, 2020: with COVID-19 infection -> acute hypoxic respiratory failure  Was noted to have heart rates in the 40s, with frequent PVCs in bigeminy.  Heart rate was stable per cardiology consultation; was also noted to be hypokalemic.  Beta-blocker continued -but was converted from carvedilol to metoprolol 25 mg twice daily.  Reviewed  CV studies:    The following studies were reviewed today: (if available, images/films reviewed: From Epic Chart or Care Everywhere)  TTE 04/05/2019: EF 60 to 65% without wall motion normality.  GR 1 DD.  Mild LA dilation.  Trivial MR.  Frequent PVCs noted.   Interval History:   [name] [name] returns here today feeling somewhat fatigued and tired.  She has ups and downs since leaving the hospital from her Covid infection.  She has days sometimes will she feel some palpitations off and on for instance all day Saturday she had a lot of mental Sunday had few.  But really has not had that  much in the way of any cardiac symptoms.  She still short of breath and that is gotten a little bit better but still notable with exertion.  No resting dyspnea.  No more coughing or fevers.  No sensation whatsoever to suggest recurrent A. fib, just the PVCs.  She does not really have any significant ankle edema but does have some abdominal swelling.  No cramping.  No PND or orthopnea.  CV Review of Symptoms (Summary):  positive for - dyspnea on exertion, irregular heartbeat and palpitations negative for - chest pain, edema, orthopnea, paroxysmal nocturnal dyspnea, rapid heart rate, shortness of breath or Syncope/near syncope, TIA/amaurosis fugax, claudication  The patient does not have symptoms concerning for COVID-19 infection (fever, chills, cough, or new shortness of breath).  No more fevers or chills.  Is recovering from her recent Covid infection. The patient is practicing social distancing & Masking.  Despite her efforts, she contracted COVID-19 in December.   REVIEWED OF SYSTEMS   A comprehensive ROS was performed. Review of Systems  Constitutional: Positive for malaise/fatigue. Negative for weight loss.  HENT: Negative for congestion and nosebleeds.   Respiratory: Positive for cough (Getting much better) and wheezing (Much less). Negative for shortness of breath (Somewhat on exertion but not at rest).   Gastrointestinal: Negative for blood in stool, heartburn, melena, nausea and vomiting.  Genitourinary: Negative for hematuria.  Musculoskeletal: Positive for joint pain and myalgias.  Still has a lot of aching  Neurological: Negative for dizziness, focal weakness and weakness.  Psychiatric/Behavioral: Negative for memory loss. The patient is not nervous/anxious and does not have insomnia.    I have reviewed and (if needed) personally updated the patient's problem list, medications, allergies, past medical and surgical history, social and family history.   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 EF was 25-30% by echo. GR2 DD - likely related to LBBB in setting of A. fib   Obesity (BMI 30-39.9) 06/11/2013   Paroxysmal atrial fibrillation ([place]): CHA2DS2-VASc Score - starting Eliquis 08/06/2015   This patients CHA2DS2-VASc Score and unadjusted Ischemic Stroke Rate (% per year) is equal to 4.8 % stroke rate/year from a score of 4  Above score calculated as 1 point each if present [CHF, HTN, DM, Vascular=MI/PAD/Aortic Plaque, Age if 65-74, or Female]; 2 points each if present [Age > 75, or Stroke/TIA/TE]   Pneumonia due to COVID-19 virus 04/05/2019   PONV (postoperative nausea and vomiting)    S/P radiation therapy 07/18/2013-08/28/2013   1) Left Breast / 50 Gy in 25 fractions/ 2) Left Breast Boost / 10 Gy in 5 fractions   Shingles     PAST SURGICAL HISTORY   Past Surgical History:  Procedure Laterality Date   ABDOMINAL HYSTERECTOMY     BREAST LUMPECTOMY Left    neg   CARDIAC CATHETERIZATION N/A 08/10/2015   Procedure: Right/Left Heart Cath and Coronary Angiography;  Surgeon: Burnell Blanks, MD;  Location: [facility];  Service: Cardiovascular;: Nonobstructive CAD   CATARACT EXTRACTION BILATERAL W/ ANTERIOR VITRECTOMY Bilateral     CHOLECYSTECTOMY     COLONOSCOPY  2014   DUPUYTREN / PALMAR FASCIOTOMY Bilateral 2007   x 3 to left and once to the right    epidural injections     x 3   FOOT TENDON SURGERY Right ~ 11/2015   "stepped in a hole and tore 2 tendons"   JOINT REPLACEMENT     KNEE ARTHROSCOPY Bilateral    LUMBAR FUSION  10/2013   Dr. Lynann Bologna   TONSILLECTOMY     TOTAL KNEE ARTHROPLASTY Left 09/23/2016   TOTAL KNEE ARTHROPLASTY Left 09/23/2016   Procedure: TOTAL KNEE ARTHROPLASTY;  Surgeon: Dorna Leitz, MD;  Location: [facility];  Service: Orthopedics;  Laterality: Left;   TRANSTHORACIC ECHOCARDIOGRAM  08/07/2015   Severely reduced LVEF at 25-30% with diffuse HK. GR 2 DD. Ventricular dyssynergy secondary to LBBB. Small to moderate pericardial effusion but no hemodynamic compromise   TRANSTHORACIC ECHOCARDIOGRAM  10/2015    (EF up from 25-30%): - Left ventricle: Poor acoustic windows -  difficult  to see.    EF estimated at 45-50% with inferoseptal and possible posterior hypokinesis. GR 1 DD. No pericardial effusion.   TUBAL LIGATION     UPPER GI ENDOSCOPY       MEDICATIONS/ALLERGIES   Current Meds  Medication Sig   acetaminophen (TYLENOL) 500 MG tablet Take 1,000 mg by mouth every 6 (six) hours as needed for moderate pain.   buPROPion (WELLBUTRIN XL) 150 MG 24 hr tablet TAKE 1 TABLET BY MOUTH EVERY DAY IN THE MORNING (Patient taking differently: Take 150 mg by mouth daily. )   Cholecalciferol (VITAMIN D) 2000 UNITS tablet Take 4,000 Units by mouth daily.   Coenzyme Q10 (COQ10) 100 MG CAPS Take 300 mg by mouth daily. (Patient taking differently: Take 300 mg by mouth at bedtime. )   docusate sodium (COLACE) 100 MG capsule Take 1 capsule (100 mg total) by mouth 2 (two) times daily.   DULoxetine (CYMBALTA) 30 MG capsule TAKE ONE CAPSULE 3 TIMES A DAY (Patient taking differently: Take 30 mg by mouth 2 (two) times daily. )   ELIQUIS 5 MG TABS tablet TAKE 1 TABLET BY MOUTH TWICE A DAY (Patient  taking differently: Take 5 mg by mouth 2 (two) times daily. )   ferrous sulfate 325 (65 FE) MG tablet Take 1 tablet 2 x /day with Meals for Low Iron (Patient taking differently: Take 325 mg by mouth 2 (two) times daily with a meal. )   furosemide (LASIX) 20 MG tablet Take 1 tablet (20 mg total) by mouth daily.   gabapentin (NEURONTIN) 300 MG capsule Take 1 capsule (300 mg total) by mouth 2 (two) times daily.   ketoconazole (NIZORAL) 2 % shampoo Apply 1 application topically 2 (two) times a week. (Patient taking differently: Apply 1 application topically daily as needed for irritation. )   levothyroxine (SYNTHROID) 50 MCG tablet Take 1/2 to 1 tablet daily as directed on an empty stomach with only water for 30 minutes & no Antacid meds, Calcium or Magnesium for 4 hours & avoid Biotin (Patient taking differently: Take 50 mcg by mouth daily. )   losartan (COZAAR) 100 MG tablet Take 1 tablet (100 mg total) by mouth daily.   metoprolol tartrate (LOPRESSOR) 25 MG tablet Take 1 tablet (25 mg total) by mouth 2 (two) times daily.   mometasone (NASONEX) 50 MCG/ACT nasal spray INSTILL 1-2 SPRAYS INTO EACH NOSTRIL TWO TIMES A DAY (Patient taking differently: Place 1-2 sprays into the nose 2 (two) times daily. )   nystatin ointment (MYCOSTATIN) Apply 1 application topically 2 (two) times daily. (Patient taking differently: Apply 1 application topically 2 (two) times daily as needed (rash/yeast). )   omeprazole (PRILOSEC) 40 MG capsule Take 1 capsule twice daily for Acid Indigestion & Reflux. (Patient taking differently: Take 40 mg by mouth 2 (two) times daily. )   rosuvastatin (CRESTOR) 40 MG tablet TAKE 1 TABLET EVERY DAY, EXCEPT TAKE 2 TABLETS ON MONDAY (Patient taking differently: Take 40 mg by mouth at bedtime. )   temazepam (RESTORIL) 30 MG capsule TAKE 1 CAPSULE BY MOUTH AT BEDTIME ONLY IF NEEDED   triamcinolone ointment (KENALOG) 0.1 % Apply 1 application topically 2 (two) times daily. (Patient  taking differently: Apply 1 application topically 2 (two) times daily as needed (rash). )    Allergies  Allergen Reactions   Requip [Ropinirole Hcl] Shortness Of Breath and Nausea And Vomiting   Minocycline Hives   Tetracyclines & Related Hives   Zestril [Lisinopril] Cough   Zetia [  Ezetimibe] Other (See Comments)    MYALGIA JOINT PAIN   Amoxicillin Rash    Did it involve swelling of the face/tongue/throat, SOB, or low BP? No Did it involve sudden or severe rash/hives, skin peeling, or any reaction on the inside of your mouth or nose? No Did you need to seek medical attention at a hospital or doctor's office? No When did it last happen?>10years If all above answers are NO, may proceed with cephalosporin use.    Codeine Other (See Comments)    fatigue   Morphine Sulfate Other (See Comments)    Palpitations   Sulfa Antibiotics Other (See Comments)    Headache (pt states that a blood pressure medicine caused a severe headache but doesn't remember a reaction to sulfa)    SOCIAL HISTORY/FAMILY HISTORY   Social History   Tobacco Use   Smoking status: Never Smoker   Smokeless tobacco: Never Used  Substance Use Topics   Alcohol use: Yes    Comment: wine occasionally-rarely   Drug use: No   Social History   Social History Narrative   Not on file    Family History family history includes COPD in her father; Heart attack in her mother; Hyperlipidemia in her sister; Hypertension in her father; Stroke in her mother.   OBJCTIVE -PE, EKG, labs   Wt Readings from Last 3 Encounters:  04/30/19 228 lb (103.4 kg)  04/25/19 226 lb (102.5 kg)  04/17/19 222 lb (100.7 kg)    Physical Exam: BP 119/76    Pulse 86    Temp (!) 95.7 F (35.4 C)    Ht 5\' 5"  (1.651 m)    Wt 228 lb (103.4 kg)    SpO2 98%    BMI 37.94 kg/m  Physical Exam  Constitutional: She is oriented to person, place, and time. She appears well-developed and well-nourished. No distress.  Seems  Tired, but nontoxic.  Well-groomed  HENT:  Head: Normocephalic and atraumatic.  Neck: No hepatojugular reflux and no JVD present. Carotid bruit is not present.  Cardiovascular: Normal rate, regular rhythm, normal heart sounds and intact distal pulses.  Occasional extrasystoles are present. PMI is not displaced. Exam reveals no gallop.  No murmur heard. Abdominal: Soft. Bowel sounds are normal. There is no abdominal tenderness.  Mild increased abdominal girth/obese.  No HSM Cannot exclude ascites  Musculoskeletal:        General: No edema. Normal range of motion.     Cervical back: Normal range of motion and neck supple.  Neurological: She is alert and oriented to person, place, and time.  Psychiatric: Her behavior is normal. Judgment and thought content normal.  Vitals reviewed.    Adult ECG Report  Rate: 86 ;  Rhythm: normal sinus rhythm and LBBB.  Otherwise normal;   Narrative Interpretation: Stable  Recent Labs:    Lab Results  Component Value Date   CHOL 119 04/04/2019   HDL 38 (L) 04/04/2019   LDLCALC 48 04/04/2019   TRIG 163 (H) 04/04/2019   CHOLHDL 3.1 04/04/2019   Lab Results  Component Value Date   CREATININE 1.00 (H) 04/25/2019   BUN 13 04/25/2019   NA 141 04/25/2019   K 3.2 (L) 04/25/2019   CL 102 04/25/2019   CO2 29 04/25/2019    ASSESSMENT/PLAN    Problem List Items Addressed This Visit    Paroxysmal atrial fibrillation ([place]): CHA2DS2-VASc Score - starting Eliquis (Chronic)    Starting still, she did not really have any A. fib symptoms  before during her hospitalization.  Not really noticing too much late palpitations.  Syncope stable.  Stable [name] and now on metoprolol as opposed to carvedilol.  Anticipate converting to Toprol at next visit.  Evaluated with 2-week monitor to exclude recurrence of A. fib.      Relevant Orders   EKG 12-Lead   LONG TERM MONITOR (3-14 DAYS)   Non-ischemic cardiomyopathy (HCC) (Chronic)    EF went back to normal  based on most recent echo.  Now probably is mostly diastolic dysfunction.  Continue beta-blocker and ARB.  Based on history of having reduced EF, I would want to switch her to Toprol once we arrived at a stable dose.      Hyperlipidemia (Chronic)    Triglycerides are little high, but otherwise lipids look pretty good in December.  She is on stable dose of rosuvastatin.      Chronic diastolic congestive heart failure (HCC) (Chronic)    Seems euvolemic, but has some abdominal swelling.  Was reduced to once daily Lasix in the hospital.  She may need more than that.  She did have some hypokalemia however so we will need to have supplemental potassium intake with additional Lasix.  Will allow for 1-2 dose of Lasix a day.      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  Not sure if this is related to electro abnormalities or a new issue with her.  Plan per hospital recommendation was an event monitor.  I think a 14-day monitor is probably more effective than it 48-hour monitored in order to get a much better determination of the amount of arrhythmia and PVCs etc.  Otherwise continue current dose of beta-blocker.  She was converted from carvedilol to Lopressor.  Want to get a stable dose of Lopressor, anticipate switching to Toprol.      H/O left bundle branch block (Chronic)    Clearly has LBBB now.  Interestingly, EF by recent echo looked pretty well preserved.  There was septal wall motion consistent with bundle branch block, but preserved EF.      Bradycardia    Did have some bradycardia in the hospital.  Associated with PVCs.  Evaluate heart rate responsiveness with 14-day Zio patch monitor.      Hypokalemia    Most recent potassium showed a drop to 3.2.  We will give potassium supplementation since he may be taking more Lasix. Exam plan will be to take 40 mg for 3 days and then 20 mg daily until we can recheck labs next  week.  Further dosing will be based on follow-up labs.  However if she does take next dose of Lasix for swelling, she is to take an additional dose of potassium supplement.      Relevant Orders   EKG [id number]   Basic metabolic panel       [id number] Education: The signs and symptoms of COVID-19 were discussed with the patient and how to seek care for testing (follow up with PCP or arrange E-visit).   The importance of social distancing was discussed today.  I spent a total of 23minutes with the patient and chart review. >  50% of the time was spent in direct patient consultation.  Additional time spent with chart review (studies, outside notes, etc): 10 Total Time: 28 min   Current medicines are reviewed at length with the patient today.  (+/- concerns) n/a   Patient Instructions /  Medication Changes & Studies & Tests Ordered   Patient Instructions  Medication Instructions:  START Potassium 20 mEq once daily. For the next 3 days, take 40 mEq (2 tablets)  *If you need a refill on your cardiac medications before your next appointment, please call your pharmacy*  Lab Work: Your provider would like for you to return next week to have the following labs drawn: BMET. You do not need an appointment for the lab. Once in our office lobby there is a podium where you can sign in and ring the doorbell to alert [place] that you are here. The lab is open from 8:00 am to 4:30 pm; closed for lunch from 12:45pm-1:45pm.  If you have labs (blood work) drawn today and your tests are completely normal, you will receive your results only by:  [place] (if you have MyChart) OR  A paper copy in the mail If you have any lab test that is abnormal or we need to change your treatment, we will call you to review the results.  Testing/Procedures: [place] [place]- Long Term Monitor Instructions   Your physician has requested you wear your ZIO patch monitor 14 days.   This is a single patch monitor.  [employer]  supplies one patch monitor per enrollment.  Additional stickers are not available.   Please do not apply patch if you will be having a Nuclear Stress Test, Echocardiogram, Cardiac CT, MRI, or Chest Xray during the time frame you would be wearing the monitor. The patch cannot be worn during these tests.  You cannot remove and re-apply the ZIO XT patch monitor.   Your ZIO patch monitor will be sent USPS Priority mail from [facility] directly to your home address. The monitor may also be mailed to a PO BOX if home delivery is not available.   It may take 3-5 days to receive your monitor after you have been enrolled.   Once you have received you monitor, please review enclosed instructions.  Your monitor has already been registered assigning a specific monitor serial # to you.   Applying the monitor   Shave hair from upper left chest.   Hold abrader disc by orange tab.  Rub abrader in 40 strokes over left upper chest as indicated in your monitor instructions.   Clean area with 4 enclosed alcohol pads .  Use all pads to assure are is cleaned thoroughly.  Let dry.   Apply patch as indicated in monitor instructions.  Patch will be place under collarbone on left side of chest with arrow pointing upward.   Rub patch adhesive wings for 2 minutes.Remove white label marked "1".  Remove white label marked "2".  Rub patch adhesive wings for 2 additional minutes.   While looking in a mirror, press and release button in center of patch.  A small green light will flash 3-4 times .  This will be your only indicator the monitor has been turned on.     Do not shower for the first 24 hours.  You may shower after the first 24 hours.   Press button if you feel a symptom. You will hear a small click.  Record Date, Time and Symptom in the Patient Log Book.   When you are ready to remove patch, follow instructions on last 2 pages of Patient Log Book.  Stick patch monitor onto last page of Patient Log  Book.   Place Patient Log Book in [place] box.  Use locking tab on box and  tape box closed securely.  The [employer] has [employer] on it.  Please place in mailbox as soon as possible.  Your physician should have your test results approximately 7 days after the monitor has been mailed back to [facility]   Call [place] at [phone] if you have questions regarding your ZIO XT patch monitor.  Call them immediately if you see an orange light blinking on your monitor.   If your monitor falls off in less than 4 days contact our Monitor department at [phone].  If your monitor becomes loose or falls off after 4 days call [employer] at [phone] for suggestions on securing your monitor.    Follow-Up: At [facility], you and your health needs are our priority.  As part of our continuing mission to provide you with exceptional heart care, we have created designated Provider Care Teams.  These Care Teams include your primary Cardiologist (physician) and Advanced Practice Providers (APPs -  Physician Assistants and Nurse Practitioners) who all work together to provide you with the care you need, when you need it.  Your next appointment:   2 month(s)  The format for your next appointment:   In Person  Provider:   You may see Glenetta Hew, MD or one of the following Advanced Practice Providers on your designated Care Team:    Rosaria Ferries, PA-C  Jory Sims, DNP, ANP  Cadence Kathlen Mody, NP      Studies Ordered:   Orders Placed This Encounter  Procedures   Basic metabolic panel   LONG TERM MONITOR (3-14 DAYS)   EKG 12-Lead     Glenetta Hew, M.D., M.S. Interventional Cardiologist   Pager # [phone] Phone # [phone] [address] [place], [place] [address]   Thank you for choosing [facility] at [facility]!!

## 2019-04-30 NOTE — Assessment & Plan Note (Addendum)
EF went back to normal based on most recent echo.  Now probably is mostly diastolic dysfunction.  Continue beta-blocker and ARB.  Based on history of having reduced EF, I would want to switch her to Toprol once we arrived at a stable dose.

## 2019-04-30 NOTE — Patient Instructions (Signed)
Medication Instructions:  START Potassium 20 mEq once daily. For the next 3 days, take 40 mEq (2 tablets)  *If you need a refill on your cardiac medications before your next appointment, please call your pharmacy*  Lab Work: Your provider would like for you to return next week to have the following labs drawn: BMET. You do not need an appointment for the lab. Once in our office lobby there is a podium where you can sign in and ring the doorbell to alert [place] that you are here. The lab is open from 8:00 am to 4:30 pm; closed for lunch from 12:45pm-1:45pm.  If you have labs (blood work) drawn today and your tests are completely normal, you will receive your results only by: Marland Kitchen MyChart Message (if you have MyChart) OR . A paper copy in the mail If you have any lab test that is abnormal or we need to change your treatment, we will call you to review the results.  Testing/Procedures: [place] [place]- Long Term Monitor Instructions   Your physician has requested you wear your ZIO patch monitor 14 days.   This is a single patch monitor.  [employer] supplies one patch monitor per enrollment.  Additional stickers are not available.   Please do not apply patch if you will be having a Nuclear Stress Test, Echocardiogram, Cardiac CT, MRI, or Chest Xray during the time frame you would be wearing the monitor. The patch cannot be worn during these tests.  You cannot remove and re-apply the ZIO XT patch monitor.   Your ZIO patch monitor will be sent USPS Priority mail from [facility] directly to your home address. The monitor may also be mailed to a PO BOX if home delivery is not available.   It may take 3-5 days to receive your monitor after you have been enrolled.   Once you have received you monitor, please review enclosed instructions.  Your monitor has already been registered assigning a specific monitor serial # to you.   Applying the monitor   Shave hair from upper left chest.   Hold abrader disc by  orange tab.  Rub abrader in 40 strokes over left upper chest as indicated in your monitor instructions.   Clean area with 4 enclosed alcohol pads .  Use all pads to assure are is cleaned thoroughly.  Let dry.   Apply patch as indicated in monitor instructions.  Patch will be place under collarbone on left side of chest with arrow pointing upward.   Rub patch adhesive wings for 2 minutes.Remove white label marked "1".  Remove white label marked "2".  Rub patch adhesive wings for 2 additional minutes.   While looking in a mirror, press and release button in center of patch.  A small green light will flash 3-4 times .  This will be your only indicator the monitor has been turned on.     Do not shower for the first 24 hours.  You may shower after the first 24 hours.   Press button if you feel a symptom. You will hear a small click.  Record Date, Time and Symptom in the Patient Log Book.   When you are ready to remove patch, follow instructions on last 2 pages of Patient Log Book.  Stick patch monitor onto last page of Patient Log Book.   Place Patient Log Book in [place] box.  Use locking tab on box and tape box closed securely.  The [place] and [employer] has [employer]  on it.  Please place in mailbox as soon as possible.  Your physician should have your test results approximately 7 days after the monitor has been mailed back to [facility].   Call [place] at [phone] if you have questions regarding your ZIO XT patch monitor.  Call them immediately if you see an orange light blinking on your monitor.   If your monitor falls off in less than 4 days contact our Monitor department at [phone].  If your monitor becomes loose or falls off after 4 days call [employer] at [phone] for suggestions on securing your monitor.    Follow-Up: At [facility], you and your health needs are our priority.  As part of our continuing mission to provide you with exceptional  heart care, we have created designated Provider Care Teams.  These Care Teams include your primary Cardiologist (physician) and Advanced Practice Providers (APPs -  Physician Assistants and Nurse Practitioners) who all work together to provide you with the care you need, when you need it.  Your next appointment:   2 month(s)  The format for your next appointment:   In Person  Provider:   You may see Glenetta Hew, MD or one of the following Advanced Practice Providers on your designated Care Team:    Rosaria Ferries, PA-C  Jory Sims, DNP, ANP  Cadence Kathlen Mody, NP

## 2019-04-30 NOTE — Assessment & Plan Note (Signed)
Triglycerides are little high, but otherwise lipids look pretty good in December.  She is on stable dose of rosuvastatin.

## 2019-04-30 NOTE — Assessment & Plan Note (Signed)
Clearly has LBBB now.  Interestingly, EF by recent echo looked pretty well preserved.  There was septal wall motion consistent with bundle branch block, but preserved EF.

## 2019-04-30 NOTE — Assessment & Plan Note (Signed)
Did have some bradycardia in the hospital.  Associated with PVCs.  Evaluate heart rate responsiveness with 14-day Zio patch monitor.

## 2019-04-30 NOTE — Assessment & Plan Note (Signed)
Starting still, she did not really have any A. fib symptoms before during her hospitalization.  Not really noticing too much late palpitations.  Syncope stable.  Stable [name] and now on metoprolol as opposed to carvedilol.  Anticipate converting to Toprol at next visit.  Evaluated with 2-week monitor to exclude recurrence of A. fib.

## 2019-04-30 NOTE — Telephone Encounter (Signed)
Enrolled patient for a 14 day Zio monitor to be mailed to patients home.  

## 2019-04-30 NOTE — Assessment & Plan Note (Signed)
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  Not sure if this is related to electro abnormalities or a new issue with her.  Plan per hospital recommendation was an event monitor.  I think a 14-day monitor is probably more effective than it 48-hour monitored in order to get a much better determination of the amount of arrhythmia and PVCs etc.  Otherwise continue current dose of beta-blocker.  She was converted from carvedilol to Lopressor.  Want to get a stable dose of Lopressor, anticipate switching to Toprol.

## 2019-05-01 ENCOUNTER — Telehealth: Payer: Self-pay | Admitting: Cardiology

## 2019-05-01 MED ORDER — METOPROLOL TARTRATE 25 MG PO TABS: 25.0000 mg | ORAL_TABLET | Freq: Two times a day (BID) | ORAL | 3 refills | Status: DC

## 2019-05-01 NOTE — Telephone Encounter (Signed)
*  STAT* If patient is at the pharmacy, call can be transferred to refill team.   1. Which medications need to be refilled? (please list name of each medication and dose if known)   metoprolol tartrate (LOPRESSOR) 25 MG tablet  2. Which pharmacy/location (including street and city if local pharmacy) is medication to be sent to?  [id number] - [place], [place] [address] AT [address]  3. Do they need a 30 day or 90 day supply? 90  Pt is out of medication

## 2019-05-01 NOTE — Telephone Encounter (Signed)
Pt went to her pharmacy today and was told it would be ~$250 for her Eliquis. Her insurance has changed and she is not able to pay the full price.  I informed patient of the [place], and she states she can not use coupons or assistance programs with Medicaid. She has about a month of medication left, but will need to start planning what to do before her medicine runs out

## 2019-05-01 NOTE — Telephone Encounter (Signed)
1)Spoke with patient and advised that amount could be secondary to having deductible to meet. Patient is going to reach out to her insurance and call back. Also explained if she spent 3% yearly out of pocket with all her medications she may qualify for patient assistance. She will call back after she speaks to her insurance.   2)Patient spoke to her supervisor after he appointment yesterday. Her supervisor will not allow her to come back to work until her nausea, left arm pain radiating to back, weakness and dizziness has subsided. He PCP has her out of work until first of February but she would like  Dr Ellyn Hack to take her out until he can figure out what is going on with her  Will forward to Dr Ellyn Hack for review

## 2019-05-01 NOTE — Telephone Encounter (Signed)
So when I saw her there was no mention about left arm pain radiating to the back. She does have nausea weakness and dizziness is all associated with her recent Covid infection.   Heart wise things seem to be pretty stable overall with improved echo.  She did not seem to have any A. Fib.   The only thing I was evaluating was to check a monitor on her for her PVCs.  As long as there is no significant arrhythmias noted on her monitor, I would not have a good cardiac reason to keep her out of work.   The post Covid infection symptoms would probably be something that her PCP would manage.  I think if she is still feeling weak by February her PCP will probably keep her out of longer.  Glenetta Hew, MD

## 2019-05-02 NOTE — Telephone Encounter (Signed)
1) Dr Allison Quarry recommendations reviewed with patient, verbalized understanding.   2) patient stated she spoke with her insurance and they are going to be faxing a tier exception for the Eliquis   Will forward to [employer] to be looking for

## 2019-05-02 NOTE — Telephone Encounter (Signed)
Follow Up  Patient states that she called the insurance company about the prescription Eliquis and there will be a fax being sent to office from [facility] and paperwork will be needing to be filled out and faxed back.

## 2019-05-03 ENCOUNTER — Ambulatory Visit (INDEPENDENT_AMBULATORY_CARE_PROVIDER_SITE_OTHER): Payer: Medicare Other

## 2019-05-03 DIAGNOSIS — I480 Paroxysmal atrial fibrillation: Secondary | ICD-10-CM | POA: Diagnosis not present

## 2019-05-03 NOTE — Telephone Encounter (Signed)
Pt's medication has already been sent to pt's pharmacy. Confirmation received.

## 2019-05-06 ENCOUNTER — Telehealth: Payer: Self-pay | Admitting: Cardiology

## 2019-05-06 ENCOUNTER — Encounter: Payer: Self-pay | Admitting: Adult Health

## 2019-05-06 ENCOUNTER — Telehealth: Payer: Self-pay

## 2019-05-06 NOTE — Telephone Encounter (Signed)
Lauren from [employer] to follow up on tier exception for Eliquis. States that the tier exception is pending review from the provider. She asks that we contact the patient with any updates.

## 2019-05-06 NOTE — Telephone Encounter (Signed)
Returned call to patient, aware nurse is working on tier exception for medication.   Patient verbalized understanding.

## 2019-05-06 NOTE — Telephone Encounter (Signed)
Patient received a letter from [facility] in regards to her Elquis . The patient requested that [facility] cover this medication for her, but [employer] [facility] has not heard anything from Dr. Ellyn Hack. If [employer] [facility] does not hear anything within 14 days they will not be able to cover this medication for the patient.   [employer] Line: [phone] [id number]

## 2019-05-06 NOTE — Telephone Encounter (Signed)
Patient states that her supervisor won't allow her to come back to work yet until all covid related symptoms are gone. Needs an out of work note, as of right now, it can say to return on Feb 15th.

## 2019-05-07 NOTE — Telephone Encounter (Signed)
Patient came by office and letter given to her.

## 2019-05-07 NOTE — Telephone Encounter (Signed)
Left message to call back  

## 2019-05-08 ENCOUNTER — Other Ambulatory Visit: Payer: Self-pay | Admitting: Internal Medicine

## 2019-05-08 DIAGNOSIS — Z1231 Encounter for screening mammogram for malignant neoplasm of breast: Secondary | ICD-10-CM

## 2019-05-08 LAB — BASIC METABOLIC PANEL
BUN/Creatinine Ratio: 11 — ABNORMAL LOW (ref 12–28)
BUN: 12 mg/dL (ref 8–27)
CO2: 25 mmol/L (ref 20–29)
Calcium: 9.6 mg/dL (ref 8.7–10.3)
Chloride: 101 mmol/L (ref 96–106)
Creatinine, Ser: 1.07 mg/dL — ABNORMAL HIGH (ref 0.57–1.00)
GFR calc Af Amer: 60 mL/min/{1.73_m2} (ref >59)
GFR calc non Af Amer: 52 mL/min/{1.73_m2} — ABNORMAL LOW (ref >59)
Glucose: 86 mg/dL (ref 65–99)
Potassium: 4.2 mmol/L (ref 3.5–5.2)
Sodium: 140 mmol/L (ref 134–144)

## 2019-05-13 NOTE — Telephone Encounter (Signed)
Rodman Key from [employer] is calling stating they have still not received the needed information for medication. He states to callback the number [phone] and they will ask all the necessary questions needed to have medication covered. Please advise.

## 2019-05-13 NOTE — Telephone Encounter (Signed)
Will route to nurse to make aware.  Will mark as high priority.  Thank you!

## 2019-05-16 MED ORDER — APIXABAN 5 MG PO TABS: 5.0000 mg | ORAL_TABLET | Freq: Two times a day (BID) | ORAL | 0 refills | Status: DC

## 2019-05-16 NOTE — Telephone Encounter (Signed)
SEE NOTE FROM 05/16/19  ( 05/01/19)

## 2019-05-16 NOTE — Telephone Encounter (Signed)
Spoke to patient . Informed patient that  There is not a way to to get tier exception for medication because it s brand name and there is no brand name in any tier below tier 3 with her insurance.   the only options is try for assistance from manufacturing -  Application form was explained and  Left for patient to it up  Along with samples  Patient verbalized understanding.

## 2019-05-17 ENCOUNTER — Other Ambulatory Visit: Payer: Self-pay

## 2019-05-17 ENCOUNTER — Encounter: Payer: Self-pay | Admitting: Podiatry

## 2019-05-17 ENCOUNTER — Ambulatory Visit: Payer: Medicare Other | Admitting: Podiatry

## 2019-05-17 VITALS — Temp 96.1°F

## 2019-05-17 DIAGNOSIS — M21619 Bunion of unspecified foot: Secondary | ICD-10-CM | POA: Diagnosis not present

## 2019-05-17 DIAGNOSIS — L600 Ingrowing nail: Secondary | ICD-10-CM

## 2019-05-17 MED ORDER — NEOMYCIN-POLYMYXIN-HC 3.5-10000-1 OT SOLN: OTIC | 1 refills | Status: DC

## 2019-05-17 NOTE — Patient Instructions (Signed)

## 2019-05-22 NOTE — Progress Notes (Signed)
Subjective:   Patient ID: [name] [name], female   DOB: 74 y.o.   MRN: [id number]   HPI Patient presents stating that she has a painful ingrown toenail that she would like to get fixed and she has had it trimmed in the past which only gives her temporary relief.  Patient also has significant bunion deformity and states at times her joints become sore right over left   ROS      Objective:  Physical Exam  Neurovascular status was found to be intact muscle strength was found to be adequate with patient noted to have inflammation and pain of the first MPJ right and left with patient wearing orthotics currently which give her some relief of symptoms with patient noted to have incurvated right hallux medial border that is painful with no active drainage or redness     Assessment:  Ingrown toenail deformity right hallux medial border with pain along with digital deformity and structural bunion deformity hallux limitus deformity     Plan:  H&P conditions reviewed and for the structural capsulitis I have recommended the continuation of conservative care consisting of orthotics with the consideration for injection which she does not want today.  Patient wants ingrown toenail fixed and I went ahead and explained procedure allowed her to sign consent form and I infiltrated the right hallux 60 mg like Marcaine mixture sterile prep applied to the toe and using sterile instrumentation I removed the medial border exposed the matrix and applied phenol 3 applications 30 seconds followed by alcohol lavage and sterile dressing.  Gave instructions on soaks drop usage and encouraged her to leave dressing on 24 hours but take it off earlier if throbbing were to occur and encouraged her to call with questions concerns

## 2019-05-23 NOTE — Progress Notes (Signed)
Assessment and Plan:  [name] [name] was seen today for follow-up.  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 Follow up if any further concerns.   Dizziness History and exam suggestive of BPV -nonsmoker, normal neuro, does have history of vertigo in the past, meclizine PRN, exercises given, on elequis, if worsening HA, changes vision/speech, imbalance, weakness go to the ER  Further diposition pending results of labs. Discussed med's effects and SE's.   Over 15 minutes of exam, counseling, chart review, and critical decision making was performed.   Future Appointments  Date Time Provider [place]  06/14/2019  2:10 PM [facility] MM 2 [facility]  07/04/2019  1:40 PM [name] Man, MD [name]  08/01/2019  2:00 PM Liane Comber, NP [facility] None  01/20/2020  2:00 PM Vicie Mutters, PA-C [facility] None    ------------------------------------------------------------------------------------------------------------------   HPI BP 128/68   Pulse 77   Temp (!) 97.3 F (36.3 C)   Wt 229 lb (103.9 kg)   SpO2 95%   BMI 38.11 kg/m   73 y.o.female presents for follow up after covid 19 infection with complication requiring admission, requesting to be released to work, in [employer], 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   She reports resolved exepting mild fatigue and some dizziness of she bends over and gets up quickly. She has also noted dizziness with turning head or rolling over while in bed. Denies headache, blurry vision,  nausea, syncope or falls.   She was for pneumonia with respiratory failure 12/24-12/30/2020 after improvement with decadron and IV remdesivir. She was recovering fairly at hospital follow up [name] [name] on 04/17/2019 as per below. Presents for further follow up of progress and labs today.    She has a history of Combined Systolic and Diastolic CHF Denies dyspnea on exertion, orthopnea, paroxysmal nocturnal dyspnea and edema. Positive for mild fatigue. Taking lasix 20 mg daily. Weighs daily, automatically transmitted to [facility] for review.  Wt Readings from Last 3 Encounters:  05/24/19 229 lb (103.9 kg)  04/30/19 228 lb (103.4 kg)  04/25/19 226 lb (102.5 kg)   She followed up with Dr. Ellyn Hack on 04/30/2019 for recommended Holter (sinus brady with bigeminy), zio patch ordered but results pending.  Continues with elequis BID. Switched from Coreg to metoprolol 25 twice daily as per cardiology recommendation, pulse and BPs reportedly have been much improved, 70s.   Her blood pressure has been controlled at home, today their BP is BP: 128/68  She does not workout. She denies chest pain, shortness of breath; she does endorse some dizziness with position changes.   She has been pushing fluids, carrying a cup of ice water 20 oz x 3-4 daily, also 1/2 bottle of gatorade Lab Results  Component Value Date   NA 140 05/07/2019   K 4.2 05/07/2019   CL 101 05/07/2019   CO2 25 05/07/2019   GLUCOSE 86 05/07/2019   BUN 12 05/07/2019   CREATININE 1.07 (H) 05/07/2019   CALCIUM 9.6 05/07/2019   GFRAA 60 05/07/2019   GFRNONAA 52 (L) 05/07/2019     Follow up 04/17/2019:  She was discharged on 5 more  days of Decadron 6 mg p.o. daily, took last dose of this yesterday and reports continues to do well thus far.  She does have some mild intermittent cough, denies any secretions.  Does have a fair bit of fatigue, resting frequently, denies dizziness, shortness of breath. Will rest after climbing stairs. She is  living by herself, son does call daily to check, neighbors will pick up anything she needs and leave on door step. Monitoring VS including O2 which has been stable 96+%.She will remain in quarantine until 04/25/2019. Supposed to return to work after that time but unsure if she will be ready considering fatigue.   She has a history of Combined Systolic and Diastolic CHF.  Denies dyspnea on exertion, orthopnea, paroxysmal nocturnal dyspnea and edema. Positive for fatigue. Taking lasix 20 mg daily. Weighs daily, automatically transmitted to [facility] for review.  Wt Readings from Last 3 Encounters:  05/24/19 229 lb (103.9 kg)  04/30/19 228 lb (103.4 kg)  04/25/19 226 lb (102.5 kg)      Past Medical History:  Diagnosis Date  . Anemia   . Anxiety   . Arthritis   . Breast cancer ([place])     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  . Chronic back pain    cyst sitting on L4-5;slipped disc  . Depression    takes Cymbalta daily  . Diverticulosis   . Dysrhythmia   . GERD (gastroesophageal reflux disease)    takes Omeprazole daily  . Headache(784.0)    rare  . History of gastric ulcer at age 14  . History of pancreatitis 03/27/2008  . History of ulcer disease 08/06/2015  . Hyperlipidemia   . Hypertension    takes Cardura nightly and Verapamil daily  . Hypothyroidism   . Insomnia    takes Restoril nightly  . Nonischemic cardiomyopathy ([place]) 07/2015   Normal coronary arteries by cath. EF was 25-30% by echo. GR2 DD - likely related to LBBB in setting of A. fib  . Obesity (BMI 30-39.9) 06/11/2013  . Paroxysmal atrial fibrillation ([place]): CHA2DS2-VASc Score - starting Eliquis 08/06/2015   This patients CHA2DS2-VASc Score and unadjusted Ischemic Stroke Rate (% per year) is equal to 4.8 % stroke rate/year from a score of 4  Above score calculated as 1 point each if present [CHF, HTN, DM, Vascular=MI/PAD/Aortic Plaque, Age if 65-74, or Female]; 2 points each if present  [Age > 75, or Stroke/TIA/TE]  . Pneumonia due to COVID-19 virus 04/05/2019  . PONV (postoperative nausea and vomiting)   . S/P radiation therapy 07/18/2013-08/28/2013   1) Left Breast / 50 Gy in 25 fractions/ 2) Left Breast Boost / 10 Gy in 5 fractions  . Shingles      Allergies  Allergen Reactions  . Requip [Ropinirole Hcl] Shortness Of Breath and Nausea And Vomiting  . Minocycline Hives  . Tetracyclines & Related Hives  . Zestril [Lisinopril] Cough  . Zetia [Ezetimibe] Other (See Comments)    MYALGIA JOINT PAIN  . Amoxicillin Rash    Did it involve swelling of the face/tongue/throat, SOB, or low BP? No Did it involve sudden or severe rash/hives, skin peeling, or any reaction on the inside of your mouth or nose? No Did you need to seek medical attention at a hospital or doctor's office? No When did it last happen?>10years If all above answers are "NO", may proceed with cephalosporin use.   . Codeine Other (See Comments)    fatigue  . Morphine Sulfate Other (See  Comments)    Palpitations  . Sulfa Antibiotics Other (See Comments)    Headache (pt states that a blood pressure medicine caused a severe headache but doesn't remember a reaction to sulfa)    Current Outpatient Medications on File Prior to Visit  Medication Sig  . acetaminophen (TYLENOL) 500 MG tablet Take 1,000 mg by mouth every 6 (six) hours as needed for moderate pain.  Marland Kitchen apixaban (ELIQUIS) 5 MG TABS tablet Take 1 tablet (5 mg total) by mouth 2 (two) times daily.  Marland Kitchen buPROPion (WELLBUTRIN XL) 150 MG 24 hr tablet TAKE 1 TABLET BY MOUTH EVERY DAY IN THE MORNING (Patient taking differently: Take 150 mg by mouth daily. )  . Cholecalciferol (VITAMIN D) 2000 UNITS tablet Take 4,000 Units by mouth daily.  . Coenzyme Q10 (COQ10) 100 MG CAPS Take 300 mg by mouth daily. (Patient taking differently: Take 300 mg by mouth at bedtime. )  . docusate sodium (COLACE) 100 MG capsule Take 1 capsule (100 mg total) by mouth 2 (two) times  daily.  . DULoxetine (CYMBALTA) 30 MG capsule TAKE ONE CAPSULE 3 TIMES A DAY (Patient taking differently: Take 30 mg by mouth 2 (two) times daily. )  . ferrous sulfate 325 (65 FE) MG tablet Take 1 tablet 2 x /day with Meals for Low Iron (Patient taking differently: Take 325 mg by mouth 2 (two) times daily with a meal. )  . furosemide (LASIX) 20 MG tablet Take 1 tablet (20 mg total) by mouth daily.  Marland Kitchen gabapentin (NEURONTIN) 300 MG capsule Take 1 capsule (300 mg total) by mouth 2 (two) times daily.  Marland Kitchen ketoconazole (NIZORAL) 2 % shampoo Apply 1 application topically 2 (two) times a week. (Patient taking differently: Apply 1 application topically daily as needed for irritation. )  . levothyroxine (SYNTHROID) 50 MCG tablet Take 1/2 to 1 tablet daily as directed on an empty stomach with only water for 30 minutes & no Antacid meds, Calcium or Magnesium for 4 hours & avoid Biotin (Patient taking differently: Take 50 mcg by mouth daily. )  . losartan (COZAAR) 100 MG tablet Take 1 tablet (100 mg total) by mouth daily.  . metoprolol tartrate (LOPRESSOR) 25 MG tablet Take 1 tablet (25 mg total) by mouth 2 (two) times daily.  . mometasone (NASONEX) 50 MCG/ACT nasal spray INSTILL 1-2 SPRAYS INTO EACH NOSTRIL TWO TIMES A DAY (Patient taking differently: Place 1-2 sprays into the nose 2 (two) times daily. )  . neomycin-polymyxin-hydrocortisone (CORTISPORIN) OTIC solution Apply 1-2 drops to toe BID after soaking  . nystatin ointment (MYCOSTATIN) Apply 1 application topically 2 (two) times daily. (Patient taking differently: Apply 1 application topically 2 (two) times daily as needed (rash/yeast). )  . omeprazole (PRILOSEC) 40 MG capsule Take 1 capsule twice daily for Acid Indigestion & Reflux. (Patient taking differently: Take 40 mg by mouth 2 (two) times daily. )  . potassium chloride SA (KLOR-CON M20) 20 MEQ tablet Take one tablet daily or as directed by your provider.  . rosuvastatin (CRESTOR) 40 MG tablet TAKE 1  TABLET EVERY DAY, EXCEPT TAKE 2 TABLETS ON MONDAY (Patient taking differently: Take 40 mg by mouth at bedtime. )  . temazepam (RESTORIL) 30 MG capsule TAKE 1 CAPSULE BY MOUTH AT BEDTIME ONLY IF NEEDED  . triamcinolone ointment (KENALOG) 0.1 % Apply 1 application topically 2 (two) times daily. (Patient taking differently: Apply 1 application topically 2 (two) times daily as needed (rash). )  . ipratropium (ATROVENT) 0.03 % nasal spray Place  2 sprays into the nose 3 (three) times daily. (Patient taking differently: Place 2 sprays into the nose daily as needed for rhinitis. )   No current facility-administered medications on file prior to visit.    ROS: all negative except above.   Physical Exam:  BP 128/68   Pulse 77   Temp (!) 97.3 F (36.3 C)   Wt 229 lb (103.9 kg)   SpO2 95%   BMI 38.11 kg/m   General Appearance: Pale/fatigued appearing adult female, well dressed, in no acute distress. Eyes: PERRLA, conjunctiva no swelling or erythema Sinuses: No Frontal/maxillary tenderness ENT/Mouth: Ext aud canals clear, TMs without erythema, bulging. Mask in place; Hearing normal.  Neck: Supple, thyroid normal.  Respiratory: Respiratory effort normal, BS equal bilaterally without rales, rhonchi, wheezing or stridor.  Cardio: RRR with no MRGs. Brisk peripheral pulses without edema.  Abdomen: Soft, + BS.  Non tender, no guarding, rebound, hernias, masses. Lymphatics: Non tender without lymphadenopathy.  Musculoskeletal: No effusion or obvious deformity, symmetrical strength, slow steady gait.  Skin: Warm, dry without rashes, lesions, ecchymosis.  Neuro: Normal muscle tone, no cerebellar symptoms. Sensation intact.  Psych: Awake and oriented X 3, normal affect, Insight and Judgment appropriate.     Izora Ribas, NP 11:05 AM [facility]

## 2019-05-24 ENCOUNTER — Ambulatory Visit: Payer: Medicare Other | Admitting: Adult Health

## 2019-05-24 ENCOUNTER — Encounter: Payer: Self-pay | Admitting: Adult Health

## 2019-05-24 ENCOUNTER — Other Ambulatory Visit: Payer: Self-pay

## 2019-05-24 VITALS — BP 128/68 | HR 77 | Temp 97.3°F | Wt 229.0 lb

## 2019-05-24 DIAGNOSIS — Z8616 Personal history of COVID-19: Secondary | ICD-10-CM

## 2019-05-24 DIAGNOSIS — R42 Dizziness and giddiness: Secondary | ICD-10-CM | POA: Diagnosis not present

## 2019-05-24 MED ORDER — MECLIZINE HCL 25 MG PO TABS: ORAL_TABLET | ORAL | 0 refills | Status: AC

## 2019-05-28 ENCOUNTER — Telehealth: Payer: Self-pay | Admitting: *Deleted

## 2019-05-28 NOTE — Telephone Encounter (Signed)
[facility] insurance pharmacist called back to verify quantity and direction    exception was approved.

## 2019-05-28 NOTE — Telephone Encounter (Signed)
Patient notified  medication was approved with dose and directions

## 2019-05-28 NOTE — Telephone Encounter (Signed)
Called and Spoke to representative - started exception to the plan for Rosuvastatin 40 mg -   Instructions Patient takes 40 mg everyday except on mondays take 80 mg   completed information over the phone.  representative states the exception for the direction and quantity of medications    Information will be sent to [facility] pharmacist for review and response will be faxed to office,

## 2019-06-09 ENCOUNTER — Other Ambulatory Visit: Payer: Self-pay | Admitting: Cardiology

## 2019-06-14 ENCOUNTER — Other Ambulatory Visit: Payer: Self-pay

## 2019-06-14 ENCOUNTER — Ambulatory Visit
Admission: RE | Admit: 2019-06-14 | Discharge: 2019-06-14 | Disposition: A | Discharge status: Home / Self Care | Payer: Medicare Other | Source: Ambulatory Visit | Attending: Internal Medicine | Admitting: Internal Medicine

## 2019-06-14 ENCOUNTER — Other Ambulatory Visit: Payer: Self-pay | Admitting: Internal Medicine

## 2019-06-14 DIAGNOSIS — Z1231 Encounter for screening mammogram for malignant neoplasm of breast: Secondary | ICD-10-CM

## 2019-06-14 DIAGNOSIS — N644 Mastodynia: Secondary | ICD-10-CM

## 2019-06-14 DIAGNOSIS — N63 Unspecified lump in unspecified breast: Secondary | ICD-10-CM

## 2019-06-19 ENCOUNTER — Ambulatory Visit
Admission: RE | Admit: 2019-06-19 | Discharge: 2019-06-19 | Disposition: A | Discharge status: Home / Self Care | Payer: Medicare Other | Source: Ambulatory Visit | Attending: Internal Medicine | Admitting: Internal Medicine

## 2019-06-19 ENCOUNTER — Other Ambulatory Visit: Payer: Self-pay

## 2019-06-19 DIAGNOSIS — N63 Unspecified lump in unspecified breast: Secondary | ICD-10-CM

## 2019-06-19 DIAGNOSIS — N644 Mastodynia: Secondary | ICD-10-CM

## 2019-06-22 ENCOUNTER — Other Ambulatory Visit: Payer: Self-pay | Admitting: Adult Health

## 2019-07-04 ENCOUNTER — Ambulatory Visit: Payer: Medicare Other | Admitting: Cardiology

## 2019-07-04 ENCOUNTER — Encounter: Payer: Self-pay | Admitting: Cardiology

## 2019-07-04 ENCOUNTER — Other Ambulatory Visit: Payer: Self-pay

## 2019-07-04 VITALS — BP 118/68 | HR 78 | Ht 65.0 in | Wt 229.0 lb

## 2019-07-04 DIAGNOSIS — I428 Other cardiomyopathies: Secondary | ICD-10-CM

## 2019-07-04 DIAGNOSIS — I480 Paroxysmal atrial fibrillation: Secondary | ICD-10-CM | POA: Diagnosis not present

## 2019-07-04 DIAGNOSIS — I5032 Chronic diastolic (congestive) heart failure: Secondary | ICD-10-CM | POA: Diagnosis not present

## 2019-07-04 DIAGNOSIS — I493 Ventricular premature depolarization: Secondary | ICD-10-CM

## 2019-07-04 DIAGNOSIS — I471 Supraventricular tachycardia: Secondary | ICD-10-CM

## 2019-07-04 DIAGNOSIS — I10 Essential (primary) hypertension: Secondary | ICD-10-CM

## 2019-07-04 DIAGNOSIS — E782 Mixed hyperlipidemia: Secondary | ICD-10-CM

## 2019-07-04 MED ORDER — FUROSEMIDE 20 MG PO TABS: 20.0000 mg | ORAL_TABLET | Freq: Two times a day (BID) | ORAL | 3 refills | Status: DC

## 2019-07-04 MED ORDER — METOPROLOL SUCCINATE ER 50 MG PO TB24: 50.0000 mg | ORAL_TABLET | Freq: Every day | ORAL | 3 refills | Status: DC

## 2019-07-04 NOTE — Patient Instructions (Signed)
Medication Instructions:    when you complete the bottle of Metoprolol tartrate 25 mg stop taking medication Then Start taking  Toprol XL  ( Metoprolol succinate) 50 mg  One tablet  Daily    Increase  taking Furosemide 40 mg daily   _( 2 tablets of 20 mg )  *If you need a refill on your cardiac medications before your next appointment, please call your pharmacy*   Lab Work:   [facility]- @ April 25 ,2021   If you have labs (blood work) drawn today and your tests are completely normal, you will receive your results only by: Marland Kitchen MyChart Message (if you have MyChart) OR . A paper copy in the mail If you have any lab test that is abnormal or we need to change your treatment, we will call you to review the results.   Testing/Procedures: Not needed   Follow-Up: At [facility], you and your health needs are our priority.  As part of our continuing mission to provide you with exceptional heart care, we have created designated Provider Care Teams.  These Care Teams include your primary Cardiologist (physician) and Advanced Practice Providers (APPs -  Physician Assistants and Nurse Practitioners) who all work together to provide you with the care you need, when you need it.    Your next appointment:   10 month(s) Jan 2022  The format for your next appointment:   In Person  Provider:   Glenetta Hew, MD   Other Instructions  contact your insurance to see if Xarelto is cheaper than Eliquis   - then call the office we can change medication

## 2019-07-04 NOTE — Progress Notes (Signed)
Primary Care Provider: Unk Pinto, MD Cardiologist: Glenetta Hew, MD Electrophysiologist:   Clinic Note: Chief Complaint  Patient presents with  . Follow-up    42-month-monitor results  . Cardiomyopathy    Essentially resolved by last echo.    HPI:    [name] [name] is a 74 y.o. female with a PMH follow-up for mild nonischemic cardiomyopathy with an EF of 45 to 50% with some diastolic dysfunction-NYHA class II CHF symptoms with LBBB and PAF ([name]), as well as CKD-3 who presents today for 3 month follow-up.   Admitted December 24-30, 2020: with COVID-19 infection -> acute hypoxic respiratory failure  Noted to have some bradycardia, PVCs and bigeminy.  Beta-blocker converted from carvedilol to metoprolol 25 mg twice daily. ->  Plan was outpatient monitor.  [name] [name] was last seen on January 90 2021  Recent Hospitalizations:   n/a  Reviewed  CV studies:    The following studies were reviewed today: (if available, images/films reviewed: From Epic Chart or Care Everywhere) . Monitor January 2021: Mostly sinus rhythm, rate ranging from 62 to 130 bpm.  Average 80 bpm.  1 short run of SVT-5 beats, rate 120 bpm.  Rare (less than 1%) PACs or PVCs noted.  PVCs with both bigeminy and trigeminy noted.--Relatively normal.   Interval History:   [name] [name] returns here returns today stating that she is finally starting to feel may be low but back to normal, stating that she is now back to work and doing well..  She is waiting for 3 months before getting her Covid vaccine-and is not sure that she wants to do that (fearful of potential interactions.  She is less tired and fatigued than she had been.  Breathing is notably improved.  She still has swelling near the end of the day and occasionally has some orthopnea.  Overall though, her exercise related shortness of breath has improved as she gets better from her Covid infection.  She really has not noted any  exercise intolerance, but also not any signs or symptoms of recurrent A. fib or even significant ectopy.  CV Review of Symptoms (Summary):  positive for - Still has some exertional dyspnea, but notably improved.  Mild ankle edema. negative for - chest pain, orthopnea, paroxysmal nocturnal dyspnea, rapid heart rate, shortness of breath or Syncope/near syncope, TIA/amaurosis fugax, claudication.  Notably less frequent palpitations.  The patient does not have symptoms concerning for COVID-19 infection (fever, chills, cough, or new shortness of breath).    Is recovering from her recent Covid infection.  She is still practicing social distancing and masking, but is a bit frustrated because despite her efforts, she caught Covid.  Still not sure if she wants to do the Covid vaccine.   REVIEWED OF SYSTEMS   A comprehensive ROS was performed. Review of Systems  Constitutional: Negative for malaise/fatigue (Energy level is getting better) and weight loss.  HENT: Negative for congestion and nosebleeds.   Respiratory: Positive for cough (Still little bit of nonproductive cough). Negative for shortness of breath (Somewhat on exertion but not at rest) and wheezing (Pretty much gone).   Cardiovascular: Negative for claudication.  Gastrointestinal: Negative for blood in stool, heartburn, melena, nausea and vomiting.  Genitourinary: Negative for hematuria.  Musculoskeletal: Negative for joint pain and myalgias.       Not much aching anymore.  Neurological: Negative for dizziness, focal weakness and weakness.  Psychiatric/Behavioral: Negative for memory loss. The patient is not nervous/anxious and does  not have insomnia.    I have reviewed and (if needed) personally updated the patient's problem list, medications, allergies, past medical and surgical history, social and family history.   PAST MEDICAL HISTORY   Past Medical History:  Diagnosis Date  . Anemia   . Anxiety   . Arthritis   . Breast  cancer ([place])     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  . Chronic back pain    cyst sitting on L4-5;slipped disc  . Depression    takes Cymbalta daily  . Diverticulosis   . Dysrhythmia   . GERD (gastroesophageal reflux disease)    takes Omeprazole daily  . Headache(784.0)    rare  . History of gastric ulcer at age 89  . History of pancreatitis 03/27/2008  . History of ulcer disease 08/06/2015  . Hyperlipidemia   . Hypertension    takes Cardura nightly and Verapamil daily  . Hypothyroidism   . Insomnia    takes Restoril nightly  . Nonischemic cardiomyopathy ([place]) 07/2015   Normal coronary arteries by cath. EF was 25-30% by echo. GR2 DD - likely related to LBBB in setting of A. fib  . Obesity (BMI 30-39.9) 06/11/2013  . Paroxysmal atrial fibrillation ([place]): CHA2DS2-VASc Score - starting Eliquis 08/06/2015   This patients CHA2DS2-VASc Score and unadjusted Ischemic Stroke Rate (% per year) is equal to 4.8 % stroke rate/year from a score of 4  Above score calculated as 1 point each if present [CHF, HTN, DM, Vascular=MI/PAD/Aortic Plaque, Age if 65-74, or Female]; 2 points each if present [Age > 75, or Stroke/TIA/TE]  . Pneumonia due to COVID-19 virus 04/05/2019  . PONV (postoperative nausea and vomiting)   . S/P radiation therapy 07/18/2013-08/28/2013   1) Left Breast / 50 Gy in 25 fractions/ 2) Left Breast Boost / 10 Gy in 5 fractions  . Shingles     PAST SURGICAL HISTORY   Past Surgical History:  Procedure Laterality Date  . ABDOMINAL HYSTERECTOMY    . BREAST LUMPECTOMY Left    neg  . CARDIAC CATHETERIZATION N/A 08/10/2015   Procedure: Right/Left Heart Cath and Coronary Angiography;  Surgeon: Burnell Blanks, MD;  Location: [facility];  Service: Cardiovascular;: Nonobstructive CAD  . CATARACT EXTRACTION BILATERAL W/ ANTERIOR VITRECTOMY Bilateral   . CHOLECYSTECTOMY    . COLONOSCOPY  2014  . DUPUYTREN / PALMAR FASCIOTOMY Bilateral 2007   x 3 to  left and once to the right   . epidural injections     x 3  . FOOT TENDON SURGERY Right ~ 11/2015   "stepped in a hole and tore 2 tendons"  . JOINT REPLACEMENT    . KNEE ARTHROSCOPY Bilateral   . LUMBAR FUSION  10/2013   Dr. Lynann Bologna  . TONSILLECTOMY    . TOTAL KNEE ARTHROPLASTY Left 09/23/2016  . TOTAL KNEE ARTHROPLASTY Left 09/23/2016   Procedure: TOTAL KNEE ARTHROPLASTY;  Surgeon: Dorna Leitz, MD;  Location: [facility];  Service: Orthopedics;  Laterality: Left;  . TRANSTHORACIC ECHOCARDIOGRAM  08/07/2015   Severely reduced LVEF at 25-30% with diffuse HK. GR 2 DD. Ventricular dyssynergy secondary to LBBB. Small to moderate pericardial effusion but no hemodynamic compromise  . TRANSTHORACIC ECHOCARDIOGRAM  10/2015    (EF up from 25-30%): - Left ventricle: Poor acoustic windows -  difficult to see.    EF estimated at 45-50% with inferoseptal and possible posterior hypokinesis. GR 1 DD. No pericardial effusion.  . TRANSTHORACIC ECHOCARDIOGRAM  04/05/2019  EF 60 to 65% without wall motion normality.  GR 1 DD.  Mild LA dilation.  Trivial MR.  Frequent PVCs noted.  . TUBAL LIGATION    . UPPER GI ENDOSCOPY     . TTE 04/05/2019: EF 60 to 65% without wall motion normality other than LBBB related septal bounce..  GR 1 DD.  Mild LA dilation.  Trivial MR.  Frequent PVCs noted.  MEDICATIONS/ALLERGIES   Current Meds  Medication Sig  . acetaminophen (TYLENOL) 500 MG tablet Take 1,000 mg by mouth every 6 (six) hours as needed for moderate pain.  Marland Kitchen apixaban (ELIQUIS) 5 MG TABS tablet Take 1 tablet (5 mg total) by mouth 2 (two) times daily.  Marland Kitchen buPROPion (WELLBUTRIN XL) 150 MG 24 hr tablet TAKE 1 TABLET BY MOUTH EVERY DAY IN THE MORNING  . Cholecalciferol (VITAMIN D) 2000 UNITS tablet Take 4,000 Units by mouth daily.  . Coenzyme Q10 (COQ10) 100 MG CAPS Take 300 mg by mouth daily.  Marland Kitchen docusate sodium (COLACE) 100 MG capsule Take 1 capsule (100 mg total) by mouth 2 (two) times daily.  . DULoxetine  (CYMBALTA) 30 MG capsule Take 30 mg by mouth 2 (two) times daily.  . ferrous sulfate 325 (65 FE) MG tablet Take 1 tablet 2 x /day with Meals for Low Iron  . gabapentin (NEURONTIN) 300 MG capsule Take 1 capsule (300 mg total) by mouth 2 (two) times daily.  Marland Kitchen ketoconazole (NIZORAL) 2 % shampoo Apply 1 application topically 2 (two) times a week.  . levothyroxine (SYNTHROID) 50 MCG tablet Take 50 mcg by mouth daily before breakfast.  . losartan (COZAAR) 100 MG tablet Take 1 tablet (100 mg total) by mouth daily.  . meclizine (ANTIVERT) 25 MG tablet 1/2-1 pill up to 3 times daily for motion sickness/dizziness. Caution, may cause drowsiness.  . mometasone (NASONEX) 50 MCG/ACT nasal spray Use 1-2 sprays  to each nostril 1 to 2 x /day  . neomycin-polymyxin-hydrocortisone (CORTISPORIN) OTIC solution Apply 1-2 drops to toe BID after soaking  . nystatin ointment (MYCOSTATIN) Apply 1 application topically 2 (two) times daily.  Marland Kitchen omeprazole (PRILOSEC) 40 MG capsule Take 1 capsule twice daily for Acid Indigestion & Reflux.  . potassium chloride SA (KLOR-CON M20) 20 MEQ tablet Take one tablet daily or as directed by your provider.  . rosuvastatin (CRESTOR) 40 MG tablet TAKE 1 TABLET EVERY DAY, EXCEPT TAKE 2 TABLETS ON MONDAY  . temazepam (RESTORIL) 30 MG capsule TAKE 1 CAPSULE BY MOUTH AT BEDTIME ONLY IF NEEDED  . triamcinolone ointment (KENALOG) 0.1 % Apply 1 application topically 2 (two) times daily.  . [DISCONTINUED] furosemide (LASIX) 20 MG tablet Take 1 tablet (20 mg total) by mouth daily.  . [DISCONTINUED] metoprolol tartrate (LOPRESSOR) 25 MG tablet Take 1 tablet (25 mg total) by mouth 2 (two) times daily.    Allergies  Allergen Reactions  . Requip [Ropinirole Hcl] Shortness Of Breath and Nausea And Vomiting  . Minocycline Hives  . Tetracyclines & Related Hives  . Zestril [Lisinopril] Cough  . Zetia [Ezetimibe] Other (See Comments)    MYALGIA JOINT PAIN  . Amoxicillin Rash    Did it involve  swelling of the face/tongue/throat, SOB, or low BP? No Did it involve sudden or severe rash/hives, skin peeling, or any reaction on the inside of your mouth or nose? No Did you need to seek medical attention at a hospital or doctor's office? No When did it last happen?>10years If all above answers are "NO", may proceed with  cephalosporin use.   . Codeine Other (See Comments)    fatigue  . Morphine Sulfate Other (See Comments)    Palpitations  . Sulfa Antibiotics Other (See Comments)    Headache (pt states that a blood pressure medicine caused a severe headache but doesn't remember a reaction to sulfa)    SOCIAL HISTORY/FAMILY HISTORY   Social History   Tobacco Use  . Smoking status: Never Smoker  . Smokeless tobacco: Never Used  Substance Use Topics  . Alcohol use: Yes    Comment: wine occasionally-rarely  . Drug use: No   Social History   Social History Narrative  . Not on file     Family History family history includes COPD in her father; Heart attack in her mother; Hyperlipidemia in her sister; Hypertension in her father; Stroke in her mother.   OBJCTIVE -PE, EKG, labs   Wt Readings from Last 3 Encounters:  07/04/19 229 lb (103.9 kg)  05/24/19 229 lb (103.9 kg)  04/30/19 228 lb (103.4 kg)    Physical Exam: BP 118/68   Pulse 78   Ht 5\' 5"  (1.651 m)   Wt 229 lb (103.9 kg)   SpO2 96%   BMI 38.11 kg/m  Physical Exam  Constitutional: She is oriented to person, place, and time. She appears well-developed and well-nourished. No distress.  Looks much better today.  Not as tired appearing.  Well-groomed.  HENT:  Head: Normocephalic and atraumatic.  Neck: No hepatojugular reflux and no JVD present. Carotid bruit is not present.  Cardiovascular: Normal rate, regular rhythm, normal heart sounds and intact distal pulses.  Occasional extrasystoles are present. PMI is not displaced. Exam reveals no gallop.  No murmur heard. Pulmonary/Chest: Effort normal and breath  sounds normal. No respiratory distress. She has no wheezes. She has no rales.  Musculoskeletal:        General: No edema. Normal range of motion.     Cervical back: Normal range of motion and neck supple.  Neurological: She is alert and oriented to person, place, and time.  Psychiatric: She has a normal mood and affect. Her behavior is normal. Judgment and thought content normal.  Vitals reviewed.    Adult ECG Report N/A  Recent Labs:    Lab Results  Component Value Date   CHOL 119 04/04/2019   HDL 38 (L) 04/04/2019   LDLCALC 48 04/04/2019   TRIG 163 (H) 04/04/2019   CHOLHDL 3.1 04/04/2019   Lab Results  Component Value Date   CREATININE 1.07 (H) 05/07/2019   BUN 12 05/07/2019   NA 140 05/07/2019   K 4.2 05/07/2019   CL 101 05/07/2019   CO2 25 05/07/2019    ASSESSMENT/PLAN    Problem List Items Addressed This Visit    Paroxysmal atrial fibrillation ([place]): CHA2DS2-VASc Score  4 ->  Eliquis - Primary (Chronic)    No A. fib noted on monitor.  Cannot recall last time she had an episode of A. Fib.  Remains [name] for anticoagulation and on beta-blocker for rate control.  With no active symptoms, no plans for AAD.      Relevant Medications   metoprolol succinate (TOPROL-XL) 50 MG 24 hr tablet   furosemide (LASIX) 20 MG tablet   Other Relevant Orders   Basic metabolic panel   Non-ischemic cardiomyopathy (HCC) (Chronic)    Resolved by most recent echo.  Continue ARB and beta-blocker.      Relevant Medications   metoprolol succinate (TOPROL-XL) 50 MG 24 hr tablet  furosemide (LASIX) 20 MG tablet   Hyperlipidemia (Chronic)    Stable with minimally elevated triglycerides.  Continue rosuvastatin.      Relevant Medications   metoprolol succinate (TOPROL-XL) 50 MG 24 hr tablet   furosemide (LASIX) 20 MG tablet   Chronic diastolic congestive heart failure (HCC) (Chronic)    EF back to normal by echo.  Seems euvolemic on exam, but does have intermittent swelling near  the end of the day.  She has actually been taking her Lasix 2 tablets a day, so we will simply change it to 40 mg daily.  She is on Toprol at max tolerated dose along with losartan.  Blood pressures pretty well controlled. ->  Mostly become symptomatic when in A. 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  I suspect that her ventricular bigeminy and bradycardia was probably related to her illness.  Now that she is feeling better, she denied quite well.  Tolerating her current dose of metoprolol.  We can convert to Toprol 50 mg daily.      Relevant Medications   metoprolol succinate (TOPROL-XL) 50 MG 24 hr tablet   furosemide (LASIX) 20 MG tablet   Paroxysmal supraventricular tachycardia (HCC)    Much less frequent episodes.  On monitor only had one short little bursts.  Probably not limited to consider even trying vagal maneuvers.  Plan will be to convert from metoprolol tartrate to Toprol 50 mg daily to allow for more stable coverage.      Relevant Medications   metoprolol succinate (TOPROL-XL) 50 MG 24 hr tablet   furosemide (LASIX) 20 MG tablet   Hypertension   Relevant Medications   metoprolol succinate (TOPROL-XL) 50 MG 24 hr tablet   furosemide (LASIX) 20 MG tablet   Other Relevant Orders   Basic metabolic panel   Morbid obesity ([place])    The patient understands the need to lose weight with diet and exercise. We have discussed specific strategies for this.        --> We will follow up BMP level in April to ensure stable potassium level and renal function.  COVID-19 Education: The signs and symptoms of COVID-19 were discussed with the patient and how to seek care for testing (follow up with PCP or arrange  E-visit).   The importance of social distancing was discussed today.  I spent a total of 54minutes with the patient and chart review. >  50% of the time was spent in direct patient consultation.  Additional time spent with chart review (studies, outside notes, etc): 12 Total Time: 27 min   Current medicines are reviewed at length with the patient today.  (+/- concerns) n/a   Patient Instructions / Medication Changes & Studies & Tests Ordered   Patient Instructions  Medication Instructions:    when you complete the bottle of Metoprolol tartrate 25 mg stop taking medication Then Start taking  Toprol XL  ( Metoprolol succinate) 50 mg  One tablet  Daily    Increase  taking Furosemide 40 mg daily   _( 2 tablets of 20 mg )  *If you need a refill on your cardiac medications before your next appointment, please call your  pharmacy*   Lab Work:   [facility]- @ April 25 ,2021   If you have labs (blood work) drawn today and your tests are completely normal, you will receive your results only by: Marland Kitchen MyChart Message (if you have MyChart) OR . A paper copy in the mail If you have any lab test that is abnormal or we need to change your treatment, we will call you to review the results.   Testing/Procedures: Not needed   Follow-Up: At [facility], you and your health needs are our priority.  As part of our continuing mission to provide you with exceptional heart care, we have created designated Provider Care Teams.  These Care Teams include your primary Cardiologist (physician) and Advanced Practice Providers (APPs -  Physician Assistants and Nurse Practitioners) who all work together to provide you with the care you need, when you need it.    Your next appointment:   10 month(s) Jan 2022  The format for your next appointment:   In Person  Provider:   Glenetta Hew, MD   Other Instructions  contact your insurance to see if Xarelto is cheaper than Eliquis   - then call the office we  can change medication    Studies Ordered:   Orders Placed This Encounter  Procedures  . Basic metabolic panel     Glenetta Hew, M.D., M.S. Interventional Cardiologist   Pager # [phone] Phone # [phone] [address] [place], [place] [address]   Thank you for choosing [facility] at [facility]!!

## 2019-07-08 ENCOUNTER — Encounter: Payer: Self-pay | Admitting: Cardiology

## 2019-07-08 NOTE — Assessment & Plan Note (Addendum)
EF back to normal by echo.  Seems euvolemic on exam, but does have intermittent swelling near the end of the day.  She has actually been taking her Lasix 2 tablets a day, so we will simply change it to 40 mg daily.  She is on Toprol at max tolerated dose along with losartan.  Blood pressures pretty well controlled. ->  Mostly become symptomatic when in A. fib or another tachycardia

## 2019-07-08 NOTE — Assessment & Plan Note (Signed)
Resolved by most recent echo.  Continue ARB and beta-blocker.

## 2019-07-08 NOTE — Assessment & Plan Note (Signed)
No A. fib noted on monitor.  Cannot recall last time she had an episode of A. Fib.  Remains [name] for anticoagulation and on beta-blocker for rate control.  With no active symptoms, no plans for AAD.

## 2019-07-08 NOTE — Assessment & Plan Note (Signed)
Much less frequent episodes.  On monitor only had one short little bursts.  Probably not limited to consider even trying vagal maneuvers.  Plan will be to convert from metoprolol tartrate to Toprol 50 mg daily to allow for more stable coverage.

## 2019-07-08 NOTE — Assessment & Plan Note (Signed)
The patient understands the need to lose weight with diet and exercise. We have discussed specific strategies for this.  

## 2019-07-08 NOTE — Assessment & Plan Note (Signed)
Stable with minimally elevated triglycerides.  Continue rosuvastatin.

## 2019-07-08 NOTE — Assessment & Plan Note (Signed)
She had less PVCs while on hospital, but minimal PVCs noted on her event monitor.  I suspect that her ventricular bigeminy and bradycardia was probably related to her illness.  Now that she is feeling better, she denied quite well.  Tolerating her current dose of metoprolol.  We can convert to Toprol 50 mg daily.

## 2019-07-21 ENCOUNTER — Other Ambulatory Visit: Payer: Self-pay | Admitting: Adult Health

## 2019-08-01 ENCOUNTER — Ambulatory Visit: Payer: Medicare Other | Admitting: Adult Health

## 2019-08-14 NOTE — Progress Notes (Deleted)
MEDICARE ANNUAL WELLNESS VISIT AND FOLLOW UP  Assessment:   Essential hypertension - continue medications, DASH diet, exercise and monitor at home. Call if greater than 130/80.   -     CBC with Differential/Platelet -     CMP/GFR -     TSH  Paroxysmal atrial fibrillation [facility]): CHA2DS2-VASc Score - starting Eliquis Control blood pressure, cholesterol, glucose, increase exercise.  Continue cardio follow up  Chronic combined systolic and diastolic heart failure, NYHA class 2 ([place]) -- exacerbated by A. Fib Weight stable, appears euvoluemic, followed by cardiology  Non-ischemic cardiomyopathy ([place]) Weight stable at home Optimize meds  Paroxysmal supraventricular tachycardia ([place]) Control blood pressure, cholesterol, glucose, increase exercise.  Continue cardio follow up  Non-seasonal allergic rhinitis, unspecified trigger - Allegra OTC, increase H20, allergy hygiene explained.  Gastroesophageal reflux disease without esophagitis Continue PPI/H2 blocker, diet discussed  Other specified hypothyroidism Hypothyroidism-check TSH level, continue medications the same, reminded to take on an empty stomach 30-72mins before food.  -     TSH  Lumbar radiculopathy Follow up ortho/pain management  DDD (degenerative disc disease), lumbar Follow up ortho  Hyperlipidemia -continue medications, check lipids, decrease fatty foods, increase activity.  -     Lipid panel  H/O left bundle branch block Continue cardio follow up  ANXIETY DEPRESSION Continue meds  History of pancreatitis Monitor, no ETOH  Malignant neoplasm of lower-inner quadrant of left breast in female, estrogen receptor positive ([place]) Continue follow up oncology, continue meds  Morbid Obesity with co morbidities - long discussion about weight loss, diet, and exercise ***  Prediabetes Discussed general issues about diabetes pathophysiology and management., Educational material distributed., Suggested low  cholesterol diet., Encouraged aerobic exercise., Discussed foot care., Reminded to get yearly retinal exam. -     Hemoglobin A1c  Vitamin D deficiency At goal at recent check; continue to recommend supplementation for goal of 60-100 Defer vitamin D level  History of ulcer disease Monitor, avoid NSAIDS, continue GERD meds  CKD III Increase fluids, avoid NSAIDS, monitor sugars, will monitor   Over 40 minutes of exam, counseling, chart review and critical decision making was performed Future Appointments  Date Time Provider [place]  08/15/2019  3:00 PM Liane Comber, NP [facility] None  08/19/2019  3:50 PM [facility] FINANCIAL COUNSELOR 2 [facility] None  01/20/2020  2:00 PM Vicie Mutters, PA-C [facility] None     Plan:   During the course of the visit the patient was educated and counseled about appropriate screening and preventive services including:    Pneumococcal vaccine   Prevnar 13  Influenza vaccine  Td vaccine  Screening electrocardiogram  Bone densitometry screening  Colorectal cancer screening  Diabetes screening  Glaucoma screening  Nutrition counseling   Advanced directives: requested   Subjective:  [name] [name] is a 74 y.o. female who presents for Medicare Annual Wellness Visit and 3 month follow up.   She had covid 19 with resp failure requiring admission in 12/24-12/30/20, with significant slow recovering fatigue ***   She has hx of R breast cancer in 2015, s/p lumpectomy, completed 5 years of anastrozole and was released by Dr. Jana Hakim, now following with annual mammograms.   She follows with Dr. Verl Blalock (pain management) for cervical and lower back pain with radiculopathy and gets epidural injections.  She also has knee pain seen by Dr. [name]. Has discussed spine surgery but would prefer to avoid.   she has a diagnosis of depression/anxiety and is currently on wellbutrin 150 mg, cymbalta 90  mg, reports symptoms are well  controlled on current regimen. she also takes temazepam PRN insomnia daily, hasn't done well with attempts to taper.   she has a diagnosis of GERD with history of ulcer, which is currently managed by omeprazole 40 mg and famotidine PRN breakthrough. *** she reports symptoms is currently well controlled, and denies breakthrough reflux, burning in chest, hoarseness or cough.    BMI is There is no height or weight on file to calculate BMI., she has not been working on diet and exercise. Wt Readings from Last 3 Encounters:  07/04/19 229 lb (103.9 kg)  05/24/19 229 lb (103.9 kg)  04/30/19 228 lb (103.4 kg)   She has hx of a. Fib/PSVT, on elequis, non-ischemic cardiomyopathy, diastolic CHF improved on ARB/BB, most recent ECHO with EF 60-65%, followed by cardiology Dr. Ellyn Hack.  Had recent benign holter results in Feb 2021.   Her blood pressure has been controlled at home, today their BP is   She does not workout. She denies chest pain, shortness of breath, dizziness.   She is on cholesterol medication (rosuvastatin 40 mg daily) and denies myalgias. Her cholesterol is at goal. The cholesterol last visit was:   Lab Results  Component Value Date   CHOL 119 04/04/2019   HDL 38 (L) 04/04/2019   LDLCALC 48 04/04/2019   TRIG 163 (H) 04/04/2019   CHOLHDL 3.1 04/04/2019    She has not been working on diet and exercise for prediabetes, and denies foot ulcerations, increased appetite, nausea, paresthesia of the feet, polydipsia, polyuria, visual disturbances, vomiting and weight loss. Last A1C in the office was:  Lab Results  Component Value Date   HGBA1C 6.2 (H) 01/17/2019   Last GFR: Lab Results  Component Value Date   GFRNONAA 52 (L) 05/07/2019   Patient is on Vitamin D supplement.   Lab Results  Component Value Date   VD25OH 59 01/17/2019       Medication Review: Current Outpatient Medications on File Prior to Visit  Medication Sig Dispense Refill  . acetaminophen (TYLENOL) 500 MG  tablet Take 1,000 mg by mouth every 6 (six) hours as needed for moderate pain.    Marland Kitchen apixaban (ELIQUIS) 5 MG TABS tablet Take 1 tablet (5 mg total) by mouth 2 (two) times daily. 60 tablet 0  . buPROPion (WELLBUTRIN XL) 150 MG 24 hr tablet TAKE 1 TABLET BY MOUTH EVERY DAY IN THE MORNING 90 tablet 1  . Cholecalciferol (VITAMIN D) 2000 UNITS tablet Take 4,000 Units by mouth daily.    . Coenzyme Q10 (COQ10) 100 MG CAPS Take 300 mg by mouth daily. 90 each 11  . docusate sodium (COLACE) 100 MG capsule Take 1 capsule (100 mg total) by mouth 2 (two) times daily. 30 capsule 0  . DULoxetine (CYMBALTA) 30 MG capsule Take 30 mg by mouth 2 (two) times daily.    . ferrous sulfate 325 (65 FE) MG tablet Take 1 tablet 2 x /day with Meals for Low Iron 180 tablet 3  . furosemide (LASIX) 20 MG tablet Take 1 tablet (20 mg total) by mouth 2 (two) times daily. 180 tablet 3  . gabapentin (NEURONTIN) 300 MG capsule Take 1 capsule (300 mg total) by mouth 2 (two) times daily. 90 capsule 1  . ipratropium (ATROVENT) 0.03 % nasal spray Place 2 sprays into the nose 3 (three) times daily. 54 mL 3  . ketoconazole (NIZORAL) 2 % shampoo Apply 1 application topically 2 (two) times a week. 120 mL 0  .  levothyroxine (SYNTHROID) 50 MCG tablet Take 50 mcg by mouth daily before breakfast.    . losartan (COZAAR) 100 MG tablet Take 1 tablet (100 mg total) by mouth daily. 90 tablet 1  . meclizine (ANTIVERT) 25 MG tablet 1/2-1 pill up to 3 times daily for motion sickness/dizziness. Caution, may cause drowsiness. 60 tablet 0  . metoprolol succinate (TOPROL-XL) 50 MG 24 hr tablet Take 1 tablet (50 mg total) by mouth daily. Take with or immediately following a meal. 90 tablet 3  . mometasone (NASONEX) 50 MCG/ACT nasal spray Use 1-2 sprays  to each nostril 1 to 2 x /day 51 g 3  . neomycin-polymyxin-hydrocortisone (CORTISPORIN) OTIC solution Apply 1-2 drops to toe BID after soaking 10 mL 1  . nystatin ointment (MYCOSTATIN) Apply 1 application  topically 2 (two) times daily. 30 g 0  . omeprazole (PRILOSEC) 40 MG capsule Take 1 capsule twice daily for Acid Indigestion & Reflux. 180 capsule 3  . potassium chloride SA (KLOR-CON M20) 20 MEQ tablet Take one tablet daily or as directed by your provider. 45 tablet 3  . rosuvastatin (CRESTOR) 40 MG tablet TAKE 1 TABLET EVERY DAY, EXCEPT TAKE 2 TABLETS ON MONDAY 102 tablet 2  . temazepam (RESTORIL) 30 MG capsule TAKE 1 CAPSULE BY MOUTH AT BEDTIME ONLY IF NEEDED 90 capsule 0  . triamcinolone ointment (KENALOG) 0.1 % Apply 1 application topically 2 (two) times daily. 80 g 1   No current facility-administered medications on file prior to visit.    Allergies  Allergen Reactions  . Requip [Ropinirole Hcl] Shortness Of Breath and Nausea And Vomiting  . Minocycline Hives  . Tetracyclines & Related Hives  . Zestril [Lisinopril] Cough  . Zetia [Ezetimibe] Other (See Comments)    MYALGIA JOINT PAIN  . Amoxicillin Rash    Did it involve swelling of the face/tongue/throat, SOB, or low BP? No Did it involve sudden or severe rash/hives, skin peeling, or any reaction on the inside of your mouth or nose? No Did you need to seek medical attention at a hospital or doctor's office? No When did it last happen?>10years If all above answers are "NO", may proceed with cephalosporin use.   . Codeine Other (See Comments)    fatigue  . Morphine Sulfate Other (See Comments)    Palpitations  . Sulfa Antibiotics Other (See Comments)    Headache (pt states that a blood pressure medicine caused a severe headache but doesn't remember a reaction to sulfa)    Current Problems (verified) Patient Active Problem List   Diagnosis Date Noted  . 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 Bradycardia   . Hypokalemia   . History of COVID-19   . CKD (chronic kidney disease) stage 3, GFR 30-59 ml/min 09/21/2018  . Morbid obesity ([place]) 06/14/2017  . Primary osteoarthritis of left knee 09/23/2016  . Non-ischemic  cardiomyopathy ([place])   . Chronic diastolic congestive heart failure ([place]) 08/08/2015  . Paroxysmal atrial fibrillation ([place]): CHA2DS2-VASc Score  4 ->  Eliquis 08/06/2015  . Hypothyroidism 08/06/2015  . Absolute anemia   . Encounter for Medicare annual wellness exam 03/31/2015  . DDD (degenerative disc disease), lumbar 06/16/2014  . Other abnormal glucose (prediabetes) 12/18/2013  . Vitamin D deficiency 12/18/2013  . Medication management 12/18/2013  . Paroxysmal supraventricular tachycardia ([place]) 10/18/2013  . Radiculopathy 10/16/2013  . Malignant neoplasm of lower-inner quadrant of left breast in female, estrogen receptor positive ([place]) 05/20/2013  . Hyperlipidemia 03/13/2008  . Depression, major, recurrent, in partial remission (  [place]) 03/13/2008  . Hypertension 03/13/2008  . H/O left bundle branch block 03/13/2008  . Allergic rhinitis 03/13/2008  . GERD 03/13/2008    Screening Tests Immunization History  Administered Date(s) Administered  . Influenza Whole 01/01/2013  . Influenza, High Dose Seasonal PF 01/29/2014, 12/30/2014, 01/17/2019  . Influenza,inj,Quad PF,6+ Mos 01/18/2018  . Influenza-Unspecified 12/11/2015, 02/04/2017, 01/18/2018  . Pneumococcal Conjugate-13 06/16/2014  . Pneumococcal Polysaccharide-23 05/23/2011  . Td 09/24/2018  . Tdap 03/11/2008  . Zoster 02/02/2010   Preventative care: Last colonoscopy: 2014 Last mammogram: 06/2019 (05/06/2013 + right breast cancer s/p lumpectomy) Last pap smear/pelvic exam: remote   DEXA: 05/2018 - L forearm T -1.4, otherwise in normal range Renal US 08/2015 Echo 2020 Cath 2017  Prior vaccinations: TD or Tdap: 2020 Influenza: 2020 Pneumococcal: 2013 Prevnar 13: 2016 Shingles/Zostavax: 2011, *** Covid 19: ***  Names of Other Physician/Practitioners you currently use: 1. [facility] here for primary care 2. Dr Nicki Reaper, eye doctor, last visit July 2020 3. [facility],  dentist, last visit 2020, q 6 months   Preventative care: Last colonoscopy: 2014 Last mammogram: 05/2018 (05/06/2013 + right breast cancer s/p lumpectomy) Last pap smear/pelvic exam: remote   DEXA: 05/2018 - L forearm T -1.4, otherwise in normal range Renal US 08/2015 Echo 2008  Prior vaccinations: TD or Tdap: 2009 due after 03/2018, DUE Influenza: 2019 Pneumococcal: 2013 Prevnar 13: 2016 Shingles/Zostavax: 2011  Names of Other Physician/Practitioners you currently use: 1. [facility] here for primary care 2. Dr Nicki Reaper, eye doctor, last visit April 2015, has scheduled in July 2020 3. [facility], dentist, last visit 2020, q 6 months  Patient Care Team: Unk Pinto, MD as PCP - General (Internal Medicine) Leonie Man, MD as PCP - Cardiology (Cardiology) Magrinat, Virgie Dad, MD as Consulting Physician (Oncology) Leonie Man, MD as Consulting Physician (Cardiology) Dorna Leitz, MD as Consulting Physician (Orthopedic Surgery) Rolm Bookbinder, MD as Consulting Physician (General Surgery) Eppie Gibson, MD as Attending Physician (Radiation Oncology)  SURGICAL HISTORY She  has a past surgical history that includes Cataract extraction bilateral w/ anterior vitrectomy (Bilateral); Abdominal hysterectomy; Cholecystectomy; Knee arthroscopy (Bilateral); Tonsillectomy; Colonoscopy (2014); Upper gi endoscopy; Dupuytren / palmar fasciotomy (Bilateral, 2007); Tubal ligation; epidural injections; Lumbar fusion (10/2013); Cardiac catheterization (N/A, 08/10/2015); transthoracic echocardiogram (08/07/2015); transthoracic echocardiogram (10/2015); Joint replacement; Total knee arthroplasty (Left, 09/23/2016); Breast lumpectomy (Left); Foot Tendon Surgery (Right, ~ 11/2015); Total knee arthroplasty (Left, 09/23/2016); and transthoracic echocardiogram (04/05/2019). FAMILY HISTORY Her family history includes COPD in her father; Heart attack  in her mother; Hyperlipidemia in her sister; Hypertension in her father; Stroke in her mother. SOCIAL HISTORY She  reports that she has never smoked. She has never used smokeless tobacco. She reports current alcohol use. She reports that she does not use drugs.   MEDICARE WELLNESS OBJECTIVES: Physical activity:   Cardiac risk factors:   Depression/mood screen:   Depression screen [facility] 2/9 09/24/2018  Decreased Interest 0  Down, Depressed, Hopeless 1  PHQ - 2 Score 1  Some recent data might be hidden    ADLs:  In your present state of health, do you have any difficulty performing the following activities: 04/07/2019 09/24/2018  Hearing? N N  Vision? N N  Difficulty concentrating or making decisions? N N  Walking or climbing stairs? N N  Dressing or bathing? N N  Doing errands, shopping? N N  Some recent data might be hidden  Cognitive Testing  Alert? Yes  Normal Appearance?Yes  Oriented to person? Yes  Place? Yes   Time? Yes  Recall of three objects?  Yes  Can perform simple calculations? Yes  Displays appropriate judgment?Yes  Can read the correct time from a watch face?Yes  EOL planning:    Review of Systems  Constitutional: Negative for malaise/fatigue and weight loss.  HENT: Negative for hearing loss and tinnitus.   Eyes: Negative for blurred vision and double vision.  Respiratory: Negative for cough, sputum production, shortness of breath and wheezing.   Cardiovascular: Negative for chest pain, palpitations, orthopnea, claudication, leg swelling and PND.  Gastrointestinal: Negative for abdominal pain, blood in stool, constipation, diarrhea, heartburn, melena, nausea and vomiting.  Genitourinary: Negative.   Musculoskeletal: Negative for falls, joint pain and myalgias.  Skin: Negative for rash.  Neurological: Negative for dizziness, tingling, sensory change, weakness and headaches.  Endo/Heme/Allergies: Negative for polydipsia.  Psychiatric/Behavioral: Negative.   Negative for depression, memory loss, substance abuse and suicidal ideas. The patient is not nervous/anxious and does not have insomnia.   All other systems reviewed and are negative.    Objective:     There were no vitals filed for this visit. There is no height or weight on file to calculate BMI.  General appearance: alert, no distress, WD/WN, female HEENT: normocephalic, sclerae anicteric, TMs pearly, nares patent, no discharge or erythema, pharynx normal Oral cavity: MMM, no lesions Neck: supple, no lymphadenopathy, no thyromegaly, no masses Heart: RRR, normal S1, S2, no murmurs *** Lungs: CTA bilaterally, no wheezes, rhonchi, or rales Abdomen: +bs, soft, non tender, non distended, no masses, no hepatomegaly, no splenomegaly Musculoskeletal: nontender, no swelling, no obvious deformity Extremities: no edema, no cyanosis, no clubbing Pulses: 2+ symmetric, upper and lower extremities, normal cap refill Neurological: alert, oriented x 3, CN2-12 intact, strength normal upper extremities and lower extremities, sensation normal throughout, DTRs 2+ throughout, no cerebellar signs, gait normal Psychiatric: normal affect, behavior normal, pleasant   Medicare Attestation I have personally reviewed: The patient's medical and social history Their use of alcohol, tobacco or illicit drugs Their current medications and supplements The patient's functional ability including ADLs,fall risks, home safety risks, cognitive, and hearing and visual impairment Diet and physical activities Evidence for depression or mood disorders  The patient's weight, height, BMI, and visual acuity have been recorded in the chart.  I have made referrals, counseling, and provided education to the patient based on review of the above and I have provided the patient with a written personalized care plan for preventive services.     [name] Ribas, NP   08/14/2019

## 2019-08-15 ENCOUNTER — Ambulatory Visit: Payer: Medicare Other | Admitting: Adult Health

## 2019-08-19 ENCOUNTER — Ambulatory Visit: Payer: Medicare Other

## 2019-08-23 ENCOUNTER — Other Ambulatory Visit: Payer: Self-pay | Admitting: *Deleted

## 2019-08-23 MED ORDER — GABAPENTIN 300 MG PO CAPS: 300.0000 mg | ORAL_CAPSULE | Freq: Two times a day (BID) | ORAL | 1 refills | Status: DC

## 2019-09-06 ENCOUNTER — Encounter: Payer: Self-pay | Admitting: Adult Health

## 2019-09-06 DIAGNOSIS — Z853 Personal history of malignant neoplasm of breast: Secondary | ICD-10-CM | POA: Insufficient documentation

## 2019-09-06 NOTE — Progress Notes (Signed)
MEDICARE ANNUAL WELLNESS VISIT AND FOLLOW UP  Assessment:   Encounter for annual medicare wellness visit Hep C screen completed today Otherwise UTD  Essential hypertension - continue medications, DASH diet, exercise and monitor at home. Call if greater than 130/80.   -     CBC with Differential/Platelet -     CMP/GFR -     TSH  Paroxysmal atrial fibrillation [facility]): CHA2DS2-VASc Score - starting Eliquis Control blood pressure, cholesterol, glucose, increase exercise.  Continue cardio follow up  Chronic combined systolic and diastolic heart failure, NYHA class 2 ([place]) -- exacerbated by A. Fib Weight stable, appears euvoluemic, followed by cardiology  Non-ischemic cardiomyopathy ([place]) Weight stable at home Optimize meds  Paroxysmal supraventricular tachycardia ([place]) Control blood pressure, cholesterol, glucose, increase exercise.  Continue cardio follow up  Non-seasonal allergic rhinitis, unspecified trigger - Allegra OTC, increase H20, allergy hygiene explained.  Gastroesophageal reflux disease without esophagitis Continue PPI/H2 blocker, diet discussed  Other specified hypothyroidism Hypothyroidism-check TSH level, continue medications the same, reminded to take on an empty stomach 30-26mins before food.  -     TSH  Lumbar radiculopathy Follow up ortho/pain management  DDD (degenerative disc disease), lumbar Follow up ortho  Hyperlipidemia -continue medications, check lipids, decrease fatty foods, increase activity.  -     Lipid panel  H/O left bundle branch block Continue cardio follow up  ANXIETY DEPRESSION Continue meds  History of pancreatitis Monitor, no ETOH  Hx of  -  Malignant neoplasm of lower-inner quadrant of left breast in female, estrogen receptor positive  S/p lumpectomy and 5 year anastrozole, has been released Monitoring by annual mammograms   Morbid Obesity with co morbidities ([place])  - long discussion about weight loss, diet, and  exercise   Prediabetes Discussed general issues about diabetes pathophysiology and management., Educational material distributed., Suggested low cholesterol diet., Encouraged aerobic exercise., Discussed foot care., Reminded to get yearly retinal exam. -     Hemoglobin A1c  Vitamin D deficiency At goal at recent check; continue to recommend supplementation for goal of 60-100 Defer vitamin D level  History of ulcer disease Monitor, avoid NSAIDS, continue GERD meds  CKD III Increase fluids, avoid NSAIDS, monitor sugars, will monitor  Anemia Chronic, stable, monitor; recent iron was normal, stop supplement, get on B12 sublingual which was low. UTD on colonoscopy.   Over 40 minutes of exam, counseling, chart review and critical decision making was performed Future Appointments  Date Time Provider [place]  01/20/2020  2:00 PM Vicie Mutters, PA-C [facility] None     Plan:   During the course of the visit the patient was educated and counseled about appropriate screening and preventive services including:    Pneumococcal vaccine   Prevnar 13  Influenza vaccine  Td vaccine  Screening electrocardiogram  Bone densitometry screening  Colorectal cancer screening  Diabetes screening  Glaucoma screening  Nutrition counseling   Advanced directives: requested   Subjective:  [name] [name] is a 74 y.o. female who presents for Medicare Annual Wellness Visit and 3 month follow up.   She had covid 19 with resp failure requiring admission in 12/24-12/30/20, with significant slow recovering fatigue, significantly improved.   Continues to work in [employer].   She has hx of R breast cancer in 2015, s/p lumpectomy, completed 5 years of anastrozole and was released by Dr. Jana Hakim, now following with annual mammograms.   She follows with Dr. Verl Blalock (pain management) for cervical and lower back pain with radiculopathy and gets epidural  injections though reports hasn't  been recently. She also has knee pain seen by Dr. [name]. Has discussed spine surgery but would prefer to avoid.   she has a diagnosis of depression/anxiety and is currently on wellbutrin 150 mg, cymbalta 60 mg, reports symptoms are typically well controlled with this regimen, more down in recent weeks due to several family deaths in recent months. she also takes temazepam PRN insomnia daily, hasn't done well with attempts to taper.   she has a diagnosis of GERD with history of ulcer, which is currently managed by omeprazole 40 mg and famotidine PRN breakthrough.  she reports symptoms is currently well controlled, and denies breakthrough reflux, burning in chest, hoarseness or cough.    BMI is Body mass index is 37.28 kg/m., she has not been working on diet and exercise, works all day on her feet with walking and lifting at [employer].  Wt Readings from Last 3 Encounters:  09/10/19 224 lb (101.6 kg)  07/04/19 229 lb (103.9 kg)  05/24/19 229 lb (103.9 kg)   She has hx of a. Fib/PSVT, on elequis, non-ischemic cardiomyopathy, diastolic CHF improved on ARB/BB, most recent ECHO with EF 60-65%, followed by cardiology Dr. Ellyn Hack.  Had recent benign holter results in Feb 2021.   Her blood pressure has been controlled at home, today their BP is BP: 114/68 She does not workout. She denies chest pain, shortness of breath, dizziness.   She is on cholesterol medication (rosuvastatin 40 mg daily) and denies myalgias. Her cholesterol is at goal. The cholesterol last visit was:   Lab Results  Component Value Date   CHOL 119 04/04/2019   HDL 38 (L) 04/04/2019   LDLCALC 48 04/04/2019   TRIG 163 (H) 04/04/2019   CHOLHDL 3.1 04/04/2019    She has not been working on diet and exercise for prediabetes, and denies foot ulcerations, increased appetite, nausea, paresthesia of the feet, polydipsia, polyuria, visual disturbances, vomiting and weight loss. Last A1C in the office was:  Lab Results  Component Value  Date   HGBA1C 6.2 (H) 01/17/2019   She has CKD III monitored at this office. Last GFR:  Lab Results  Component Value Date   GFRNONAA 52 (L) 05/07/2019   Patient is on Vitamin D supplement.   Lab Results  Component Value Date   VD25OH 59 01/17/2019       Medication Review: Current Outpatient Medications on File Prior to Visit  Medication Sig Dispense Refill  . acetaminophen (TYLENOL) 500 MG tablet Take 1,000 mg by mouth every 6 (six) hours as needed for moderate pain.    Marland Kitchen apixaban (ELIQUIS) 5 MG TABS tablet Take 1 tablet (5 mg total) by mouth 2 (two) times daily. 60 tablet 0  . buPROPion (WELLBUTRIN XL) 150 MG 24 hr tablet TAKE 1 TABLET BY MOUTH EVERY DAY IN THE MORNING 90 tablet 1  . Cholecalciferol (VITAMIN D) 2000 UNITS tablet Take 4,000 Units by mouth daily.    . Coenzyme Q10 (COQ10) 100 MG CAPS Take 300 mg by mouth daily. 90 each 11  . docusate sodium (COLACE) 100 MG capsule Take 1 capsule (100 mg total) by mouth 2 (two) times daily. 30 capsule 0  . DULoxetine (CYMBALTA) 30 MG capsule Take 30 mg by mouth 2 (two) times daily.    . ferrous sulfate 325 (65 FE) MG tablet Take 1 tablet 2 x /day with Meals for Low Iron 180 tablet 3  . furosemide (LASIX) 20 MG tablet Take 1 tablet (20 mg  total) by mouth 2 (two) times daily. 180 tablet 3  . gabapentin (NEURONTIN) 300 MG capsule Take 1 capsule (300 mg total) by mouth 2 (two) times daily. 90 capsule 1  . ketoconazole (NIZORAL) 2 % shampoo Apply 1 application topically 2 (two) times a week. 120 mL 0  . levothyroxine (SYNTHROID) 50 MCG tablet Take 50 mcg by mouth daily before breakfast.    . losartan (COZAAR) 100 MG tablet Take 1 tablet (100 mg total) by mouth daily. 90 tablet 1  . meclizine (ANTIVERT) 25 MG tablet 1/2-1 pill up to 3 times daily for motion sickness/dizziness. Caution, may cause drowsiness. 60 tablet 0  . metoprolol succinate (TOPROL-XL) 50 MG 24 hr tablet Take 1 tablet (50 mg total) by mouth daily. Take with or immediately  following a meal. 90 tablet 3  . mometasone (NASONEX) 50 MCG/ACT nasal spray Use 1-2 sprays  to each nostril 1 to 2 x /day 51 g 3  . neomycin-polymyxin-hydrocortisone (CORTISPORIN) OTIC solution Apply 1-2 drops to toe BID after soaking 10 mL 1  . nystatin ointment (MYCOSTATIN) Apply 1 application topically 2 (two) times daily. 30 g 0  . omeprazole (PRILOSEC) 40 MG capsule Take 1 capsule twice daily for Acid Indigestion & Reflux. 180 capsule 3  . potassium chloride SA (KLOR-CON M20) 20 MEQ tablet Take one tablet daily or as directed by your provider. 45 tablet 3  . rosuvastatin (CRESTOR) 40 MG tablet TAKE 1 TABLET EVERY DAY, EXCEPT TAKE 2 TABLETS ON MONDAY 102 tablet 2  . temazepam (RESTORIL) 30 MG capsule TAKE 1 CAPSULE BY MOUTH AT BEDTIME ONLY IF NEEDED 90 capsule 0  . triamcinolone ointment (KENALOG) 0.1 % Apply 1 application topically 2 (two) times daily. 80 g 1  . ipratropium (ATROVENT) 0.03 % nasal spray Place 2 sprays into the nose 3 (three) times daily. 54 mL 3   No current facility-administered medications on file prior to visit.    Allergies  Allergen Reactions  . Requip [Ropinirole Hcl] Shortness Of Breath and Nausea And Vomiting  . Minocycline Hives  . Tetracyclines & Related Hives  . Zestril [Lisinopril] Cough  . Zetia [Ezetimibe] Other (See Comments)    MYALGIA JOINT PAIN  . Amoxicillin Rash    Did it involve swelling of the face/tongue/throat, SOB, or low BP? No Did it involve sudden or severe rash/hives, skin peeling, or any reaction on the inside of your mouth or nose? No Did you need to seek medical attention at a hospital or doctor's office? No When did it last happen?>10years If all above answers are "NO", may proceed with cephalosporin use.   . Codeine Other (See Comments)    fatigue  . Morphine Sulfate Other (See Comments)    Palpitations  . Sulfa Antibiotics Other (See Comments)    Headache (pt states that a blood pressure medicine caused a severe headache  but doesn't remember a reaction to sulfa)    Current Problems (verified) Patient Active Problem List   Diagnosis Date Noted  . History of breast cancer 09/06/2019  . 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 Bradycardia   . History of COVID-19   . CKD (chronic kidney disease) stage 3, GFR 30-59 ml/min 09/21/2018  . Morbid obesity ([place]) 06/14/2017  . Primary osteoarthritis of left knee 09/23/2016  . Non-ischemic cardiomyopathy ([place])   . Chronic diastolic congestive heart failure ([place]) 08/08/2015  . Paroxysmal atrial fibrillation ([place]): CHA2DS2-VASc Score  4 ->  Eliquis 08/06/2015  . Hypothyroidism 08/06/2015  . Absolute  anemia   . Encounter for Medicare annual wellness exam 03/31/2015  . DDD (degenerative disc disease), lumbar 06/16/2014  . Other abnormal glucose (prediabetes) 12/18/2013  . Vitamin D deficiency 12/18/2013  . Medication management 12/18/2013  . Paroxysmal supraventricular tachycardia ([place]) 10/18/2013  . Radiculopathy 10/16/2013  . Hyperlipidemia 03/13/2008  . Depression, major, recurrent, in partial remission ([place]) 03/13/2008  . Hypertension 03/13/2008  . H/O left bundle branch block 03/13/2008  . Allergic rhinitis 03/13/2008  . GERD 03/13/2008    Screening Tests Health Maintenance  Topic Date Due  . Hepatitis C Screening  09/24/2019 (Originally [birth year])  . INFLUENZA VACCINE  11/10/2019  . MAMMOGRAM  06/18/2021  . COLONOSCOPY  03/20/2023  . TETANUS/TDAP  09/23/2028  . DEXA SCAN  Completed  . COVID-19 Vaccine  Completed  . PNA vac Low Risk Adult  Completed    Immunization History  Administered Date(s) Administered  . Influenza Whole 01/01/2013  . Influenza, High Dose Seasonal PF 01/29/2014, 12/30/2014, 01/17/2019  . Influenza,inj,Quad PF,6+ Mos 01/18/2018  . Influenza-Unspecified 12/11/2015, 02/04/2017, 01/18/2018  . PFIZER SARS-COV-2 Vaccination 05/18/2019, 06/10/2019  . Pneumococcal Conjugate-13 06/16/2014  . Pneumococcal Polysaccharide-23  05/23/2011  . Td 09/24/2018  . Tdap 03/11/2008  . Zoster 02/02/2010   Preventative care: Last colonoscopy: 2014 Last mammogram: 06/2019 (05/06/2013 + right breast cancer s/p lumpectomy) Last pap smear/pelvic exam: remote   DEXA: 05/2018 - L forearm T -1.4, otherwise in normal range Renal US 08/2015 Echo 2020 Cath 2017  Prior vaccinations: TD or Tdap: 2020 Influenza: 2020 Pneumococcal: 2013 Prevnar 13: 2016 Shingles/Zostavax: 2011, insurance won't cover at this time, declines shingrix  Covid 19: 2/2, 2021, pfizer  Names of Other Physician/Practitioners you currently use: 1. [facility] here for primary care 2. Dr Nicki Reaper, eye doctor, last visit July 2020 3. [facility], dentist, last visit 2021, q 6 months  Patient Care Team: Unk Pinto, MD as PCP - General (Internal Medicine) Leonie Man, MD as PCP - Cardiology (Cardiology) Magrinat, Virgie Dad, MD as Consulting Physician (Oncology) Leonie Man, MD as Consulting Physician (Cardiology) Dorna Leitz, MD as Consulting Physician (Orthopedic Surgery) Rolm Bookbinder, MD as Consulting Physician (General Surgery) Eppie Gibson, MD as Attending Physician (Radiation Oncology)  SURGICAL HISTORY She  has a past surgical history that includes Cataract extraction bilateral w/ anterior vitrectomy (Bilateral); Abdominal hysterectomy; Cholecystectomy; Knee arthroscopy (Bilateral); Tonsillectomy; Colonoscopy (2014); Upper gi endoscopy; Dupuytren / palmar fasciotomy (Bilateral, 2007); Tubal ligation; epidural injections; Lumbar fusion (10/2013); Cardiac catheterization (N/A, 08/10/2015); transthoracic echocardiogram (08/07/2015); transthoracic echocardiogram (10/2015); Joint replacement; Total knee arthroplasty (Left, 09/23/2016); Breast lumpectomy (Left); Foot Tendon Surgery (Right, ~ 11/2015); Total knee arthroplasty (Left, 09/23/2016); and transthoracic echocardiogram  (04/05/2019). FAMILY HISTORY Her family history includes COPD in her father; Heart attack in her mother; Hyperlipidemia in her sister; Hypertension in her father; Stroke in her mother. SOCIAL HISTORY She  reports that she has never smoked. She has never used smokeless tobacco. She reports current alcohol use. She reports that she does not use drugs.   MEDICARE WELLNESS OBJECTIVES: Physical activity:   Cardiac risk factors:   Depression/mood screen:   Depression screen [place] 2/9 09/10/2019  Decreased Interest 1  Down, Depressed, Hopeless 2  PHQ - 2 Score 3  Altered sleeping 1  Tired, decreased energy 0  Change in appetite 0  Feeling bad or failure about yourself  0  Trouble concentrating 0  Moving slowly or fidgety/restless 0  Suicidal thoughts 0  PHQ-9 Score 4  Difficult doing work/chores Somewhat difficult  Some recent data might be hidden    ADLs:  In your present state of health, do you have any difficulty performing the following activities: 04/07/2019 09/24/2018  Hearing? N N  Vision? N N  Difficulty concentrating or making decisions? N N  Walking or climbing stairs? N N  Dressing or bathing? N N  Doing errands, shopping? N N  Some recent data might be hidden     Cognitive Testing  Alert? Yes  Normal Appearance?Yes  Oriented to person? Yes  Place? Yes   Time? Yes  Recall of three objects?  Yes  Can perform simple calculations? Yes  Displays appropriate judgment?Yes  Can read the correct time from a watch face?Yes  EOL planning: Does Patient Have a Medical Advance Directive?: No Would patient like information on creating a medical advance directive?: Yes (MAU/Ambulatory/Procedural Areas - Information given)  Review of Systems  Constitutional: Negative for malaise/fatigue and weight loss.  HENT: Negative for hearing loss and tinnitus.   Eyes: Negative for blurred vision and double vision.  Respiratory: Negative for cough, sputum production, shortness of breath and  wheezing.   Cardiovascular: Negative for chest pain, palpitations, orthopnea, claudication, leg swelling and PND.  Gastrointestinal: Negative for abdominal pain, blood in stool, constipation, diarrhea, heartburn, melena, nausea and vomiting.  Genitourinary: Negative.   Musculoskeletal: Negative for falls, joint pain and myalgias.  Skin: Negative for rash.  Neurological: Negative for dizziness, tingling, sensory change, weakness and headaches.  Endo/Heme/Allergies: Negative for polydipsia.  Psychiatric/Behavioral: Negative.  Negative for depression, memory loss, substance abuse and suicidal ideas. The patient is not nervous/anxious and does not have insomnia.   All other systems reviewed and are negative.    Objective:     Today's Vitals   09/10/19 1356  BP: 114/68  Pulse: 70  Temp: (!) 95.9 F (35.5 C)  SpO2: 96%  Weight: 224 lb (101.6 kg)  Height: 5\' 5"  (1.651 m)  PainSc: 7   PainLoc: Back   Body mass index is 37.28 kg/m.  General appearance: alert, no distress, WD/WN, female HEENT: normocephalic, sclerae anicteric, TMs pearly, nares patent, no discharge or erythema, pharynx normal Oral cavity: MMM, no lesions Neck: supple, no lymphadenopathy, no thyromegaly, no masses Heart: RRR, normal S1, S2, no murmurs  Lungs: CTA bilaterally, no wheezes, rhonchi, or rales Abdomen: +bs, soft, non tender, non distended, no masses, no hepatomegaly, no splenomegaly Musculoskeletal: nontender, no swelling, no obvious deformity Extremities: no edema, no cyanosis, no clubbing Pulses: 2+ symmetric, upper and lower extremities, normal cap refill Neurological: alert, oriented x 3, CN2-12 intact, strength normal upper extremities and lower extremities, sensation normal throughout, DTRs 2+ throughout, no cerebellar signs, gait normal Psychiatric: normal affect, behavior normal, pleasant   Medicare Attestation I have personally reviewed: The patient's medical and social history Their use of  alcohol, tobacco or illicit drugs Their current medications and supplements The patient's functional ability including ADLs,fall risks, home safety risks, cognitive, and hearing and visual impairment Diet and physical activities Evidence for depression or mood disorders  The patient's weight, height, BMI, and visual acuity have been recorded in the chart.  I have made referrals, counseling, and provided education to the patient based on review of the above and I have provided the patient with a written personalized care plan for preventive services.     Izora Ribas, NP   09/10/2019

## 2019-09-10 ENCOUNTER — Ambulatory Visit: Payer: Medicare Other | Admitting: Adult Health

## 2019-09-10 ENCOUNTER — Other Ambulatory Visit: Payer: Self-pay

## 2019-09-10 ENCOUNTER — Encounter: Payer: Self-pay | Admitting: Adult Health

## 2019-09-10 VITALS — BP 114/68 | HR 70 | Temp 95.9°F | Ht 65.0 in | Wt 224.0 lb

## 2019-09-10 DIAGNOSIS — I10 Essential (primary) hypertension: Secondary | ICD-10-CM

## 2019-09-10 DIAGNOSIS — Z0000 Encounter for general adult medical examination without abnormal findings: Secondary | ICD-10-CM

## 2019-09-10 DIAGNOSIS — Z79899 Other long term (current) drug therapy: Secondary | ICD-10-CM

## 2019-09-10 DIAGNOSIS — N1831 Chronic kidney disease, stage 3a: Secondary | ICD-10-CM

## 2019-09-10 DIAGNOSIS — I471 Supraventricular tachycardia, unspecified: Secondary | ICD-10-CM

## 2019-09-10 DIAGNOSIS — I480 Paroxysmal atrial fibrillation: Secondary | ICD-10-CM | POA: Diagnosis not present

## 2019-09-10 DIAGNOSIS — Z1159 Encounter for screening for other viral diseases: Secondary | ICD-10-CM

## 2019-09-10 DIAGNOSIS — Z853 Personal history of malignant neoplasm of breast: Secondary | ICD-10-CM

## 2019-09-10 DIAGNOSIS — E039 Hypothyroidism, unspecified: Secondary | ICD-10-CM

## 2019-09-10 DIAGNOSIS — Z170 Estrogen receptor positive status [ER+]: Secondary | ICD-10-CM

## 2019-09-10 DIAGNOSIS — I5032 Chronic diastolic (congestive) heart failure: Secondary | ICD-10-CM | POA: Diagnosis not present

## 2019-09-10 DIAGNOSIS — R6889 Other general symptoms and signs: Secondary | ICD-10-CM | POA: Diagnosis not present

## 2019-09-10 DIAGNOSIS — E559 Vitamin D deficiency, unspecified: Secondary | ICD-10-CM

## 2019-09-10 DIAGNOSIS — I493 Ventricular premature depolarization: Secondary | ICD-10-CM

## 2019-09-10 DIAGNOSIS — C50312 Malignant neoplasm of lower-inner quadrant of left female breast: Secondary | ICD-10-CM

## 2019-09-10 DIAGNOSIS — I428 Other cardiomyopathies: Secondary | ICD-10-CM

## 2019-09-10 DIAGNOSIS — J3089 Other allergic rhinitis: Secondary | ICD-10-CM

## 2019-09-10 DIAGNOSIS — D649 Anemia, unspecified: Secondary | ICD-10-CM

## 2019-09-10 DIAGNOSIS — K219 Gastro-esophageal reflux disease without esophagitis: Secondary | ICD-10-CM

## 2019-09-10 DIAGNOSIS — Z0001 Encounter for general adult medical examination with abnormal findings: Secondary | ICD-10-CM

## 2019-09-10 DIAGNOSIS — E782 Mixed hyperlipidemia: Secondary | ICD-10-CM

## 2019-09-10 DIAGNOSIS — R7309 Other abnormal glucose: Secondary | ICD-10-CM

## 2019-09-10 DIAGNOSIS — F3341 Major depressive disorder, recurrent, in partial remission: Secondary | ICD-10-CM

## 2019-09-10 NOTE — Patient Instructions (Addendum)
     Ms. Furmanski , Thank you for taking time to come for your Medicare Wellness Visit. I appreciate your ongoing commitment to your health goals. Please review the following plan we discussed and let me know if I can assist you in the future.   These are the goals we discussed: Goals    . Exercise 3-5x per week (15 min per time)    . MONITOR WATER INTAKE     Aim for 55-65 fluid ounces daily.     . Weight (lb) < 220 lb (99.8 kg)       This is a list of the screening recommended for you and due dates:  Health Maintenance  Topic Date Due  .  Hepatitis C: One time screening is recommended by Center for Disease Control  (CDC) for  adults born from [birth year] through 1965.   09/24/2019*  . Flu Shot  11/10/2019  . Mammogram  06/18/2021  . Colon Cancer Screening  03/20/2023  . Tetanus Vaccine  09/23/2028  . DEXA scan (bone density measurement)  Completed  . COVID-19 Vaccine  Completed  . Pneumonia vaccines  Completed  *Topic was postponed. The date shown is not the original due date.     Add sublingual B12. The sublingual matters more than the dose, get any dose but make sure it melts in your mouth.   Will help with energy, memory/concentration, decrease nerve pain, and help with weight loss. B12 is water soluble vitamin so you can not over dose on it,.

## 2019-09-11 ENCOUNTER — Other Ambulatory Visit: Payer: Self-pay | Admitting: Adult Health

## 2019-09-11 DIAGNOSIS — N289 Disorder of kidney and ureter, unspecified: Secondary | ICD-10-CM

## 2019-09-11 LAB — HEMOGLOBIN A1C
Hgb A1c MFr Bld: 6.3 % of total Hgb — ABNORMAL HIGH (ref <5.7)
Mean Plasma Glucose: 134 (calc)
eAG (mmol/L): 7.4 (calc)

## 2019-09-11 LAB — LIPID PANEL
Cholesterol: 155 mg/dL (ref <200)
HDL: 54 mg/dL (ref >=50)
LDL Cholesterol (Calc): 71 mg/dL (calc)
Non-HDL Cholesterol (Calc): 101 mg/dL (calc) (ref <130)
Total CHOL/HDL Ratio: 2.9 (calc) (ref <5.0)
Triglycerides: 198 mg/dL — ABNORMAL HIGH (ref <150)

## 2019-09-11 LAB — CBC WITH DIFFERENTIAL/PLATELET
Absolute Monocytes: 594 cells/uL (ref 200–950)
Basophils Absolute: 90 cells/uL (ref 0–200)
Basophils Relative: 1 %
Eosinophils Absolute: 171 cells/uL (ref 15–500)
Eosinophils Relative: 1.9 %
HCT: 32.4 % — ABNORMAL LOW (ref 35.0–45.0)
Hemoglobin: 10.5 g/dL — ABNORMAL LOW (ref 11.7–15.5)
Lymphs Abs: 1935 cells/uL (ref 850–3900)
MCH: 29.1 pg (ref 27.0–33.0)
MCHC: 32.4 g/dL (ref 32.0–36.0)
MCV: 89.8 fL (ref 80.0–100.0)
MPV: 11.5 fL (ref 7.5–12.5)
Monocytes Relative: 6.6 %
Neutro Abs: 6210 cells/uL (ref 1500–7800)
Neutrophils Relative %: 69 %
Platelets: 288 10*3/uL (ref 140–400)
RBC: 3.61 10*6/uL — ABNORMAL LOW (ref 3.80–5.10)
RDW: 13.6 % (ref 11.0–15.0)
Total Lymphocyte: 21.5 %
WBC: 9 10*3/uL (ref 3.8–10.8)

## 2019-09-11 LAB — MAGNESIUM: Magnesium: 2.6 mg/dL — ABNORMAL HIGH (ref 1.5–2.5)

## 2019-09-11 LAB — COMPLETE METABOLIC PANEL WITH GFR
AG Ratio: 1.8 (calc) (ref 1.0–2.5)
ALT: 8 U/L (ref 6–29)
AST: 11 U/L (ref 10–35)
Albumin: 4.6 g/dL (ref 3.6–5.1)
Alkaline phosphatase (APISO): 96 U/L (ref 37–153)
BUN/Creatinine Ratio: 17 (calc) (ref 6–22)
BUN: 24 mg/dL (ref 7–25)
CO2: 26 mmol/L (ref 20–32)
Calcium: 9.6 mg/dL (ref 8.6–10.4)
Chloride: 101 mmol/L (ref 98–110)
Creat: 1.42 mg/dL — ABNORMAL HIGH (ref 0.60–0.93)
GFR, Est African American: 42 mL/min/{1.73_m2} — ABNORMAL LOW (ref >=60)
GFR, Est Non African American: 37 mL/min/{1.73_m2} — ABNORMAL LOW (ref >=60)
Globulin: 2.5 g/dL (calc) (ref 1.9–3.7)
Glucose, Bld: 79 mg/dL (ref 65–99)
Potassium: 4 mmol/L (ref 3.5–5.3)
Sodium: 138 mmol/L (ref 135–146)
Total Bilirubin: 0.3 mg/dL (ref 0.2–1.2)
Total Protein: 7.1 g/dL (ref 6.1–8.1)

## 2019-09-11 LAB — HEPATITIS C ANTIBODY
Hepatitis C Ab: NONREACTIVE
SIGNAL TO CUT-OFF: 0.01 (ref <1.00)

## 2019-09-11 LAB — TSH: TSH: 2.39 mIU/L (ref 0.40–4.50)

## 2019-09-26 ENCOUNTER — Ambulatory Visit: Payer: Medicare Other | Admitting: Adult Health

## 2019-10-02 ENCOUNTER — Other Ambulatory Visit: Payer: Self-pay | Admitting: Adult Health

## 2019-10-02 ENCOUNTER — Other Ambulatory Visit: Payer: Self-pay | Admitting: Internal Medicine

## 2019-10-11 ENCOUNTER — Other Ambulatory Visit: Payer: Self-pay | Admitting: Cardiology

## 2019-10-17 ENCOUNTER — Ambulatory Visit: Payer: Medicare Other | Admitting: Podiatry

## 2019-10-17 ENCOUNTER — Encounter: Payer: Self-pay | Admitting: Podiatry

## 2019-10-17 ENCOUNTER — Other Ambulatory Visit: Payer: Self-pay

## 2019-10-17 DIAGNOSIS — L600 Ingrowing nail: Secondary | ICD-10-CM

## 2019-10-17 NOTE — Patient Instructions (Signed)

## 2019-10-17 NOTE — Progress Notes (Signed)
Subjective:   Patient ID: [name] [name], female   DOB: 74 y.o.   MRN: [id number]   HPI Patient presents stating the left big toenail has become loose and thickened and I like it removed like the right one.  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  Explained procedure to patient and after review she signed consent form understanding risk and today I infiltrated the left hallux 60 mg like Marcaine mixture sterile prep applied and using sterile instrumentation I remove the hallux nail I exposed matrix I applied phenol 5 applications 30 seconds followed by alcohol lavage sterile dressing gave instructions on soaks and reappoint for [place] to recheck

## 2019-10-28 ENCOUNTER — Other Ambulatory Visit: Payer: Self-pay | Admitting: Adult Health

## 2019-10-29 ENCOUNTER — Other Ambulatory Visit: Payer: Self-pay | Admitting: Cardiology

## 2019-11-04 ENCOUNTER — Telehealth: Payer: Self-pay | Admitting: Cardiology

## 2019-11-04 NOTE — Telephone Encounter (Signed)
*  STAT* If patient is at the pharmacy, call can be transferred to refill team.   1. Which medications need to be refilled? (please list name of each medication and dose if known) apixaban (ELIQUIS) 5 MG TABS tablet  2. Which pharmacy/location (including street and city if local pharmacy) is medication to be sent to? [id number] - [place], [place] - [address]. AT [address]  3. Do they need a 30 day or 90 day supply? 30 day

## 2019-11-05 MED ORDER — APIXABAN 5 MG PO TABS: 5.0000 mg | ORAL_TABLET | Freq: Two times a day (BID) | ORAL | 1 refills | Status: DC

## 2019-11-14 ENCOUNTER — Telehealth: Payer: Self-pay | Admitting: Adult Health

## 2019-11-14 ENCOUNTER — Other Ambulatory Visit: Payer: Self-pay | Admitting: Adult Health

## 2019-11-14 MED ORDER — TEMAZEPAM 30 MG PO CAPS: ORAL_CAPSULE | ORAL | 0 refills | Status: DC

## 2019-11-14 NOTE — Telephone Encounter (Signed)
requests 90 day rx for Temazepam 30mg  1 daily to [facility].

## 2019-11-19 ENCOUNTER — Other Ambulatory Visit: Payer: Self-pay | Admitting: Cardiology

## 2019-11-21 ENCOUNTER — Other Ambulatory Visit: Payer: Self-pay | Admitting: Internal Medicine

## 2019-12-18 ENCOUNTER — Encounter: Payer: Medicare Other | Admitting: Physician Assistant

## 2020-01-16 ENCOUNTER — Encounter: Payer: Self-pay | Admitting: Podiatry

## 2020-01-16 ENCOUNTER — Ambulatory Visit (INDEPENDENT_AMBULATORY_CARE_PROVIDER_SITE_OTHER): Payer: Medicare Other

## 2020-01-16 ENCOUNTER — Other Ambulatory Visit: Payer: Self-pay

## 2020-01-16 ENCOUNTER — Ambulatory Visit: Payer: Medicare Other | Admitting: Podiatry

## 2020-01-16 DIAGNOSIS — R52 Pain, unspecified: Secondary | ICD-10-CM | POA: Diagnosis not present

## 2020-01-16 DIAGNOSIS — M21619 Bunion of unspecified foot: Secondary | ICD-10-CM | POA: Diagnosis not present

## 2020-01-16 DIAGNOSIS — M779 Enthesopathy, unspecified: Secondary | ICD-10-CM

## 2020-01-20 ENCOUNTER — Encounter: Payer: Medicare Other | Admitting: Adult Health

## 2020-01-20 NOTE — Progress Notes (Signed)
Subjective:   Patient ID: [name] [name], female   DOB: 74 y.o.   MRN: [id number]   HPI Patient presents with a lot of pain in the right ankle stating its been present for several months and also has significant bunion deformity which has been bothersome for her.  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

## 2020-01-22 ENCOUNTER — Encounter: Payer: Self-pay | Admitting: Adult Health Nurse Practitioner

## 2020-01-22 ENCOUNTER — Ambulatory Visit: Payer: Medicare Other | Admitting: Adult Health Nurse Practitioner

## 2020-01-22 ENCOUNTER — Other Ambulatory Visit: Payer: Self-pay

## 2020-01-22 VITALS — BP 120/80 | HR 66 | Temp 97.3°F | Ht 65.0 in | Wt 224.0 lb

## 2020-01-22 DIAGNOSIS — I5042 Chronic combined systolic (congestive) and diastolic (congestive) heart failure: Secondary | ICD-10-CM

## 2020-01-22 DIAGNOSIS — I480 Paroxysmal atrial fibrillation: Secondary | ICD-10-CM

## 2020-01-22 DIAGNOSIS — R300 Dysuria: Secondary | ICD-10-CM

## 2020-01-22 DIAGNOSIS — C50312 Malignant neoplasm of lower-inner quadrant of left female breast: Secondary | ICD-10-CM

## 2020-01-22 DIAGNOSIS — R7309 Other abnormal glucose: Secondary | ICD-10-CM

## 2020-01-22 DIAGNOSIS — E782 Mixed hyperlipidemia: Secondary | ICD-10-CM

## 2020-01-22 DIAGNOSIS — M5136 Other intervertebral disc degeneration, lumbar region: Secondary | ICD-10-CM

## 2020-01-22 DIAGNOSIS — M51369 Other intervertebral disc degeneration, lumbar region without mention of lumbar back pain or lower extremity pain: Secondary | ICD-10-CM

## 2020-01-22 DIAGNOSIS — N1831 Chronic kidney disease, stage 3a: Secondary | ICD-10-CM

## 2020-01-22 DIAGNOSIS — E039 Hypothyroidism, unspecified: Secondary | ICD-10-CM

## 2020-01-22 DIAGNOSIS — E559 Vitamin D deficiency, unspecified: Secondary | ICD-10-CM

## 2020-01-22 DIAGNOSIS — I10 Essential (primary) hypertension: Secondary | ICD-10-CM | POA: Diagnosis not present

## 2020-01-22 DIAGNOSIS — R10824 Left lower quadrant rebound abdominal tenderness: Secondary | ICD-10-CM

## 2020-01-22 DIAGNOSIS — I428 Other cardiomyopathies: Secondary | ICD-10-CM

## 2020-01-22 DIAGNOSIS — Z170 Estrogen receptor positive status [ER+]: Secondary | ICD-10-CM

## 2020-01-22 DIAGNOSIS — I471 Supraventricular tachycardia, unspecified: Secondary | ICD-10-CM

## 2020-01-22 DIAGNOSIS — D649 Anemia, unspecified: Secondary | ICD-10-CM

## 2020-01-22 DIAGNOSIS — J3089 Other allergic rhinitis: Secondary | ICD-10-CM

## 2020-01-22 DIAGNOSIS — K219 Gastro-esophageal reflux disease without esophagitis: Secondary | ICD-10-CM

## 2020-01-22 DIAGNOSIS — Z853 Personal history of malignant neoplasm of breast: Secondary | ICD-10-CM

## 2020-01-22 DIAGNOSIS — F3341 Major depressive disorder, recurrent, in partial remission: Secondary | ICD-10-CM

## 2020-01-22 DIAGNOSIS — R143 Flatulence: Secondary | ICD-10-CM

## 2020-01-22 MED ORDER — DICYCLOMINE HCL 10 MG PO CAPS: 10.0000 mg | ORAL_CAPSULE | Freq: Four times a day (QID) | ORAL | 0 refills | Status: DC

## 2020-01-22 NOTE — Patient Instructions (Addendum)
We will respond with your lab results via [facility].   Try a probiotic, take this once a day.  This will help with constipation, diarrhea and gas & bloating.  It help to put good bacteria into your gut.    We will send in dicyclomine for you.  Take this before every meal and at bedtime as needed.  Do this before every meal for one week to see if it makes a difference.   Due to recent changes in healthcare laws, you may see the results of your imaging and laboratory studies on MyChart before your provider has had a chance to review them.  We understand that in some cases there may be results that are confusing or concerning to you. Not all laboratory results come back in the same time frame and the provider may be waiting for multiple results in order to interpret others.  Please give [place] 48 hours in order for your provider to thoroughly review all the results before contacting the office for clarification of your results.   +++++++++++++++++++++++++  Vit D  & Vit C 1,000 mg   are recommended to help protect  against the Covid-19 and other Corona viruses.    Also it's recommended  to take  Zinc 50 mg  to help  protect against the Covid-19   and best place to get  is also on [email or web address].com  and don't pay more than 6-8 cents /pill !  ================================ Coronavirus (COVID-19) Are you at risk?  Are you at risk for the Coronavirus (COVID-19)?  To be considered HIGH RISK for Coronavirus (COVID-19), you have to meet the following criteria:  . Traveled to [place], [place], [place], [place] or [place]; or in the [place] to [place], [place], [place]  . or [place]; and have fever, cough, and shortness of breath within the last 2 weeks of travel OR . Been in close contact with a person diagnosed with COVID-19 within the last 2 weeks and have  . fever, cough,and shortness of breath .  . IF YOU DO NOT MEET THESE CRITERIA, YOU ARE CONSIDERED LOW RISK FOR COVID-19.  What to  do if you are HIGH RISK for COVID-19?  Marland Kitchen If you are having a medical emergency, call 911. . Seek medical care right away. Before you go to a doctor's office, urgent care or emergency department, .  call ahead and tell them about your recent travel, contact with someone diagnosed with COVID-19  .  and your symptoms.  . You should receive instructions from your physician's office regarding next steps of care.  . When you arrive at healthcare provider, tell the healthcare staff immediately you have returned from  . visiting [place], [place], [place], [place] or [place]; or traveled in the [place] to [place], [place],  . [place] or [place] in the last two weeks or you have been in close contact with a person diagnosed with  . COVID-19 in the last 2 weeks.   . Tell the health care staff about your symptoms: fever, cough and shortness of breath. . After you have been seen by a medical provider, you will be either: o Tested for (COVID-19) and discharged home on quarantine except to seek medical care if  o symptoms worsen, and asked to  - Stay home and avoid contact with others until you get your results (4-5 days)  - Avoid travel on public transportation if possible (such as bus, train, or airplane) or o  Sent to the Emergency Department by EMS for evaluation, COVID-19 testing  and  o possible admission depending on your condition and test results.  What to do if you are LOW RISK for COVID-19?  Reduce your risk of any infection by using the same precautions used for avoiding the common cold or flu:  Marland Kitchen Wash your hands often with soap and warm water for at least 20 seconds.  If soap and water are not readily available,  . use an alcohol-based hand sanitizer with at least 60% alcohol.  . If coughing or sneezing, cover your mouth and nose by coughing or sneezing into the elbow areas of your shirt or coat, .  into a tissue or into your sleeve (not your hands). . Avoid shaking hands with  others and consider head nods or verbal greetings only. . Avoid touching your eyes, nose, or mouth with unwashed hands.  . Avoid close contact with people who are sick. . Avoid places or events with large numbers of people in one location, like concerts or sporting events. . Carefully consider travel plans you have or are making. . If you are planning any travel outside or inside the [place], visit the CDC's Travelers' Health webpage for the latest health notices. . If you have some symptoms but not all symptoms, continue to monitor at home and seek medical attention  . if your symptoms worsen. . If you are having a medical emergency, call 911.   . >>>>>>>>>>>>>>>>>>>>>>>>>>>>>>>>> . We Do NOT Approve of  [facility], [employer]  Our Patients  To Do Home Visits & We Do NOT Approve of [employer] > > > > > > > > > > > > > > > > > > > > > > > > > > > > > > > > > > > > > > >  Preventive Care for Adults  A healthy lifestyle and preventive care can promote health and wellness. Preventive health guidelines for women include the following key practices.  A routine yearly physical is a good way to check with your health care provider about your health and preventive screening. It is a chance to share any concerns and updates on your health and to receive a thorough exam.  Visit your dentist for a routine exam and preventive care every 6 months. Brush your teeth twice a day and floss once a day. Good oral hygiene prevents tooth decay and gum disease.  The frequency of eye exams is based on your age, health, family medical history, use of contact lenses, and other factors. Follow your health care provider's recommendations for frequency of eye exams.  Eat a healthy diet. Foods like vegetables, fruits, whole grains, low-fat dairy products, and lean protein foods contain the nutrients you need without too many calories. Decrease your intake of foods high in solid fats, added  sugars, and salt. Eat the right amount of calories for you. Get information about a proper diet from your health care provider, if necessary.  Regular physical exercise is one of the most important things you can do for your health. Most adults should get at least 150 minutes of moderate-intensity exercise (any activity that increases your heart rate and causes you to sweat) each week. In addition, most adults need muscle-strengthening exercises on 2 or more days a week.  Maintain a healthy weight. The body mass index (BMI) is a screening tool to identify possible weight problems. It provides an estimate of body fat based on  height and weight. Your health care provider can find your BMI and can help you achieve or maintain a healthy weight. For adults 20 years and older:  A BMI below 18.5 is considered underweight.  A BMI of 18.5 to 24.9 is normal.  A BMI of 25 to 29.9 is considered overweight.  A BMI of 30 and above is considered obese.  Maintain normal blood lipids and cholesterol levels by exercising and minimizing your intake of saturated fat. Eat a balanced diet with plenty of fruit and vegetables. If your lipid or cholesterol levels are high, you are over 50, or you are at high risk for heart disease, you may need your cholesterol levels checked more frequently. Ongoing high lipid and cholesterol levels should be treated with medicines if diet and exercise are not working.  If you smoke, find out from your health care provider how to quit. If you do not use tobacco, do not start.  Lung cancer screening is recommended for adults aged 96-80 years who are at high risk for developing lung cancer because of a history of smoking. A yearly low-dose CT scan of the lungs is recommended for people who have at least a 30-pack-year history of smoking and are a current smoker or have quit within the past 15 years. A pack year of smoking is smoking an average of 1 pack of cigarettes a day for 1 year (for  example: 1 pack a day for 30 years or 2 packs a day for 15 years). Yearly screening should continue until the smoker has stopped smoking for at least 15 years. Yearly screening should be stopped for people who develop a health problem that would prevent them from having lung cancer treatment.  Avoid use of street drugs. Do not share needles with anyone. Ask for help if you need support or instructions about stopping the use of drugs.  High blood pressure causes heart disease and increases the risk of stroke.  Ongoing high blood pressure should be treated with medicines if weight loss and exercise do not work.  If you are 55-84 years old, ask your health care provider if you should take aspirin to prevent strokes.  Diabetes screening involves taking a blood sample to check your fasting blood sugar level. This should be done once every 3 years, after age 70, if you are within normal weight and without risk factors for diabetes. Testing should be considered at a younger age or be carried out more frequently if you are overweight and have at least 1 risk factor for diabetes.  Breast cancer screening is essential preventive care for women. You should practice "breast self-awareness." This means understanding the normal appearance and feel of your breasts and may include breast self-examination. Any changes detected, no matter how small, should be reported to a health care provider. Women in their 81s and 30s should have a clinical breast exam (CBE) by a health care provider as part of a regular health exam every 1 to 3 years. After age 2, women should have a CBE every year. Starting at age 59, women should consider having a mammogram (breast X-ray test) every year. Women who have a family history of breast cancer should talk to their health care provider about genetic screening. Women at a high risk of breast cancer should talk to their health care providers about having an MRI and a mammogram every  year.  Breast cancer gene (BRCA)-related cancer risk assessment is recommended for women who have family members with  BRCA-related cancers. BRCA-related cancers include breast, ovarian, tubal, and peritoneal cancers. Having family members with these cancers may be associated with an increased risk for harmful changes (mutations) in the breast cancer genes BRCA1 and BRCA2. Results of the assessment will determine the need for genetic counseling and BRCA1 and BRCA2 testing.  Routine pelvic exams to screen for cancer are no longer recommended for nonpregnant women who are considered low risk for cancer of the pelvic organs (ovaries, uterus, and vagina) and who do not have symptoms. Ask your health care provider if a screening pelvic exam is right for you.  If you have had past treatment for cervical cancer or a condition that could lead to cancer, you need Pap tests and screening for cancer for at least 20 years after your treatment. If Pap tests have been discontinued, your risk factors (such as having a new sexual partner) need to be reassessed to determine if screening should be resumed. Some women have medical problems that increase the chance of getting cervical cancer. In these cases, your health care provider may recommend more frequent screening and Pap tests.    Colorectal cancer can be detected and often prevented. Most routine colorectal cancer screening begins at the age of 79 years and continues through age 37 years. However, your health care provider may recommend screening at an earlier age if you have risk factors for colon cancer. On a yearly basis, your health care provider may provide home test kits to check for hidden blood in the stool. Use of a small camera at the end of a tube, to directly examine the colon (sigmoidoscopy or colonoscopy), can detect the earliest forms of colorectal cancer. Talk to your health care provider about this at age 21, when routine screening begins.  Direct  exam of the colon should be repeated every 5-10 years through age 19 years, unless early forms of pre-cancerous polyps or small growths are found.  Osteoporosis is a disease in which the bones lose minerals and strength with aging. This can result in serious bone fractures or breaks. The risk of osteoporosis can be identified using a bone density scan. Women ages 1 years and over and women at risk for fractures or osteoporosis should discuss screening with their health care providers. Ask your health care provider whether you should take a calcium supplement or vitamin D to reduce the rate of osteoporosis.  Menopause can be associated with physical symptoms and risks. Hormone replacement therapy is available to decrease symptoms and risks. You should talk to your health care provider about whether hormone replacement therapy is right for you.  Use sunscreen. Apply sunscreen liberally and repeatedly throughout the day. You should seek shade when your shadow is shorter than you. Protect yourself by wearing long sleeves, pants, a wide-brimmed hat, and sunglasses year round, whenever you are outdoors.  Once a month, do a whole body skin exam, using a mirror to look at the skin on your back. Tell your health care provider of new moles, moles that have irregular borders, moles that are larger than a pencil eraser, or moles that have changed in shape or color.  Stay current with required vaccines (immunizations).  Influenza vaccine. All adults should be immunized every year.  Tetanus, diphtheria, and acellular pertussis (Td, Tdap) vaccine. Pregnant women should receive 1 dose of Tdap vaccine during each pregnancy. The dose should be obtained regardless of the length of time since the last dose. Immunization is preferred during the 27th-36th week of gestation.  An adult who has not previously received Tdap or who does not know her vaccine status should receive 1 dose of Tdap. This initial dose should be  followed by tetanus and diphtheria toxoids (Td) booster doses every 10 years. Adults with an unknown or incomplete history of completing a 3-dose immunization series with Td-containing vaccines should begin or complete a primary immunization series including a Tdap dose. Adults should receive a Td booster every 10 years.    Zoster vaccine. One dose is recommended for adults aged 58 years or older unless certain conditions are present.    Pneumococcal 13-valent conjugate (PCV13) vaccine. When indicated, a person who is uncertain of her immunization history and has no record of immunization should receive the PCV13 vaccine. An adult aged 16 years or older who has certain medical conditions and has not been previously immunized should receive 1 dose of PCV13 vaccine. This PCV13 should be followed with a dose of pneumococcal polysaccharide (PPSV23) vaccine. The PPSV23 vaccine dose should be obtained at least 1 or more year(s) after the dose of PCV13 vaccine. An adult aged 26 years or older who has certain medical conditions and previously received 1 or more doses of PPSV23 vaccine should receive 1 dose of PCV13. The PCV13 vaccine dose should be obtained 1 or more years after the last PPSV23 vaccine dose.    Pneumococcal polysaccharide (PPSV23) vaccine. When PCV13 is also indicated, PCV13 should be obtained first. All adults aged 64 years and older should be immunized. An adult younger than age 36 years who has certain medical conditions should be immunized. Any person who resides in a nursing home or long-term care facility should be immunized. An adult smoker should be immunized. People with an immunocompromised condition and certain other conditions should receive both PCV13 and PPSV23 vaccines. People with human immunodeficiency virus (HIV) infection should be immunized as soon as possible after diagnosis. Immunization during chemotherapy or radiation therapy should be avoided. Routine use of PPSV23  vaccine is not recommended for American Indians, [place] Natives, or people younger than 65 years unless there are medical conditions that require PPSV23 vaccine. When indicated, people who have unknown immunization and have no record of immunization should receive PPSV23 vaccine. One-time revaccination 5 years after the first dose of PPSV23 is recommended for people aged 19-64 years who have chronic kidney failure, nephrotic syndrome, asplenia, or immunocompromised conditions. People who received 1-2 doses of PPSV23 before age 26 years should receive another dose of PPSV23 vaccine at age 38 years or later if at least 5 years have passed since the previous dose. Doses of PPSV23 are not needed for people immunized with PPSV23 at or after age 84 years.   Preventive Services / Frequency  Ages 11 years and over  Blood pressure check.  Lipid and cholesterol check.  Lung cancer screening. / Every year if you are aged 67-80 years and have a 30-pack-year history of smoking and currently smoke or have quit within the past 15 years. Yearly screening is stopped once you have quit smoking for at least 15 years or develop a health problem that would prevent you from having lung cancer treatment.  Clinical breast exam.** / Every year after age 27 years.   BRCA-related cancer risk assessment.** / For women who have family members with a BRCA-related cancer (breast, ovarian, tubal, or peritoneal cancers).  Mammogram.** / Every year beginning at age 3 years and continuing for as long as you are in good health. Consult with your health care  provider.  Pap test.** / Every 3 years starting at age 69 years through age 25 or 35 years with 3 consecutive normal Pap tests. Testing can be stopped between 65 and 70 years with 3 consecutive normal Pap tests and no abnormal Pap or HPV tests in the past 10 years.  Fecal occult blood test (FOBT) of stool. / Every year beginning at age 50 years and continuing until age 52  years. You may not need to do this test if you get a colonoscopy every 10 years.  Flexible sigmoidoscopy or colonoscopy.** / Every 5 years for a flexible sigmoidoscopy or every 10 years for a colonoscopy beginning at age 75 years and continuing until age 66 years.  Hepatitis C blood test.** / For all people born from [birth year] through 1965 and any individual with known risks for hepatitis C.  Osteoporosis screening.** / A one-time screening for women ages 77 years and over and women at risk for fractures or osteoporosis.  Skin self-exam. / Monthly.  Influenza vaccine. / Every year.  Tetanus, diphtheria, and acellular pertussis (Tdap/Td) vaccine.** / 1 dose of Td every 10 years.  Zoster vaccine.** / 1 dose for adults aged 43 years or older.  Pneumococcal 13-valent conjugate (PCV13) vaccine.** / Consult your health care provider.  Pneumococcal polysaccharide (PPSV23) vaccine.** / 1 dose for all adults aged 67 years and older. Screening for abdominal aortic aneurysm (AAA)  by ultrasound is recommended for people who have history of high blood pressure or who are current or former smokers. ++++++++++++++++++++ Recommend Adult Low Dose Aspirin or  coated  Aspirin 81 mg daily  To reduce risk of Colon Cancer 40 %,  Skin Cancer 26 % ,  Melanoma 46%  and  Pancreatic cancer 60% ++++++++++++++++++++ Vitamin D goal  is between 70-100.  Please make sure that you are taking your Vitamin D as directed.  It is very important as a natural anti-inflammatory  helping hair, skin, and nails, as well as reducing stroke and heart attack risk.  It helps your bones and helps with mood. It also decreases numerous cancer risks so please take it as directed.  Low Vit D is associated with a 200-300% higher risk for CANCER  and 200-300% higher risk for HEART   ATTACK  &  STROKE.   .....................................Marland Kitchen It is also associated with higher death rate at younger ages,  autoimmune diseases like  Rheumatoid arthritis, Lupus, Multiple Sclerosis.    Also many other serious conditions, like depression, Alzheimer's Dementia, infertility, muscle aches, fatigue, fibromyalgia - just to name a few. ++++++++++++++++++ Recommend the book "The END of DIETING" by Dr Excell Seltzer  & the book "The END of DIABETES " by Dr Excell Seltzer At [email or web address] - get book & Audio CD's    Being diabetic has a  300% increased risk for heart attack, stroke, cancer, and alzheimer- type vascular dementia. It is very important that you work harder with diet by avoiding all foods that are white. Avoid white rice (brown & wild rice is OK), white potatoes (sweetpotatoes in moderation is OK), White bread or wheat bread or anything made out of white flour like bagels, donuts, rolls, buns, biscuits, cakes, pastries, cookies, pizza crust, and pasta (made from white flour & egg whites) - vegetarian pasta or spinach or wheat pasta is OK. Multigrain breads like Arnold's or [employer], or multigrain sandwich thins or flatbreads.  Diet, exercise and weight loss can reverse and cure diabetes in the early stages.  Diet, exercise  and weight loss is very important in the control and prevention of complications of diabetes which affects every system in your body, ie. Brain - dementia/stroke, eyes - glaucoma/blindness, heart - heart attack/heart failure, kidneys - dialysis, stomach - gastric paralysis, intestines - malabsorption, nerves - severe painful neuritis, circulation - gangrene & loss of a leg(s), and finally cancer and Alzheimers.    I recommend avoid fried & greasy foods,  sweets/candy, white rice (brown or wild rice or Quinoa is OK), white potatoes (sweet potatoes are OK) - anything made from white flour - bagels, doughnuts, rolls, buns, biscuits,white and wheat breads, pizza crust and traditional pasta made of white flour & egg white(vegetarian pasta or spinach or wheat pasta is OK).  Multi-grain bread is OK - like multi-grain flat  bread or sandwich thins. Avoid alcohol in excess. Exercise is also important.    Eat all the vegetables you want - avoid meat, especially red meat and dairy - especially cheese.  Cheese is the most concentrated form of trans-fats which is the worst thing to clog up our arteries. Veggie cheese is OK which can be found in the fresh produce section at [employer] or [employer] or [employer]  +++++++++++++++++++ DASH Eating Plan  DASH stands for "Dietary Approaches to Stop Hypertension."   The DASH eating plan is a healthy eating plan that has been shown to reduce high blood pressure (hypertension). Additional health benefits may include reducing the risk of type 2 diabetes mellitus, heart disease, and stroke. The DASH eating plan may also help with weight loss. WHAT DO I NEED TO KNOW ABOUT THE DASH EATING PLAN? For the DASH eating plan, you will follow these general guidelines:  Choose foods with a percent daily value for sodium of less than 5% (as listed on the food label).  Use salt-free seasonings or herbs instead of table salt or sea salt.  Check with your health care provider or pharmacist before using salt substitutes.  Eat lower-sodium products, often labeled as "lower sodium" or "no salt added."  Eat fresh foods.  Eat more vegetables, fruits, and low-fat dairy products.  Choose whole grains. Look for the word "whole" as the first word in the ingredient list.  Choose fish   Limit sweets, desserts, sugars, and sugary drinks.  Choose heart-healthy fats.  Eat veggie cheese   Eat more home-cooked food and less restaurant, buffet, and fast food.  Limit fried foods.  Cook foods using methods other than frying.  Limit canned vegetables. If you do use them, rinse them well to decrease the sodium.  When eating at a restaurant, ask that your food be prepared with less salt, or no salt if possible.                      WHAT FOODS CAN I EAT? Read Dr Fara Olden Fuhrman's books on The  End of Dieting & The End of Diabetes  Grains Whole grain or whole wheat bread. Brown rice. Whole grain or whole wheat pasta. Quinoa, bulgur, and whole grain cereals. Low-sodium cereals. Corn or whole wheat flour tortillas. Whole grain cornbread. Whole grain crackers. Low-sodium crackers.  Vegetables Fresh or frozen vegetables (raw, steamed, roasted, or grilled). Low-sodium or reduced-sodium tomato and vegetable juices. Low-sodium or reduced-sodium tomato sauce and paste. Low-sodium or reduced-sodium canned vegetables.   Fruits All fresh, canned (in natural juice), or frozen fruits.  Protein Products  All fish and seafood.  Dried beans, peas, or lentils. Unsalted nuts and seeds.  Unsalted canned beans.  Dairy Low-fat dairy products, such as skim or 1% milk, 2% or reduced-fat cheeses, low-fat ricotta or cottage cheese, or plain low-fat yogurt. Low-sodium or reduced-sodium cheeses.  Fats and Oils Tub margarines without trans fats. Light or reduced-fat mayonnaise and salad dressings (reduced sodium). Avocado. Safflower, olive, or canola oils. Natural peanut or almond butter.  Other Unsalted popcorn and pretzels. The items listed above may not be a complete list of recommended foods or beverages. Contact your dietitian for more options.  +++++++++++++++  WHAT FOODS ARE NOT RECOMMENDED? Grains/ White flour or wheat flour White bread. White pasta. White rice. Refined cornbread. Bagels and croissants. Crackers that contain trans fat.  Vegetables  Creamed or fried vegetables. Vegetables in a . Regular canned vegetables. Regular canned tomato sauce and paste. Regular tomato and vegetable juices.  Fruits Dried fruits. Canned fruit in light or heavy syrup. Fruit juice.  Meat and Other Protein Products Meat in general - RED meat & White meat.  Fatty cuts of meat. Ribs, chicken wings, all processed meats as bacon, sausage, bologna, salami, fatback, hot dogs, bratwurst and packaged luncheon  meats.  Dairy Whole or 2% milk, cream, half-and-half, and cream cheese. Whole-fat or sweetened yogurt. Full-fat cheeses or blue cheese. Non-dairy creamers and whipped toppings. Processed cheese, cheese spreads, or cheese curds.  Condiments Onion and garlic salt, seasoned salt, table salt, and sea salt. Canned and packaged gravies. Worcestershire sauce. Tartar sauce. Barbecue sauce. Teriyaki sauce. Soy sauce, including reduced sodium. Steak sauce. Fish sauce. Oyster sauce. Cocktail sauce. Horseradish. Ketchup and mustard. Meat flavorings and tenderizers. Bouillon cubes. Hot sauce. Tabasco sauce. Marinades. Taco seasonings. Relishes.  Fats and Oils Butter, stick margarine, lard, shortening and bacon fat. Coconut, palm kernel, or palm oils. Regular salad dressings.  Pickles and olives. Salted popcorn and pretzels.  The items listed above may not be a complete list of foods and beverages to avoid.

## 2020-01-22 NOTE — Progress Notes (Signed)
COMPLETE PHYSICAL  Assessment / Plan:    Essential hypertension - continue medications, DASH diet, exercise and monitor at home. Call if greater than 130/80.   -     CBC with Differential/Platelet -     CMP/GFR -     TSH  Paroxysmal atrial fibrillation [facility]): CHA2DS2-VASc Score - starting Eliquis Control blood pressure, cholesterol, glucose, increase exercise.  Continue cardio follow up  Chronic combined systolic and diastolic heart failure, NYHA class 2 ([place]) -- exacerbated by A. Fib Weight stable, appears euvoluemic, followed by cardiology  Non-ischemic cardiomyopathy ([place]) Weight stable at home Optimize meds  Paroxysmal supraventricular tachycardia ([place]) Control blood pressure, cholesterol, glucose, increase exercise.  Continue cardio follow up  Non-seasonal allergic rhinitis, unspecified trigger - Allegra OTC, increase H20, allergy hygiene explained.  Gastroesophageal reflux disease without esophagitis Continue PPI/H2 blocker, diet discussed  Other specified hypothyroidism Hypothyroidism-check TSH level, continue medications the same, reminded to take on an empty stomach 30-89mins before food.  -     TSH  Lumbar radiculopathy Follow up ortho/pain management   DDD (degenerative disc disease), lumbar Follow up ortho  Hyperlipidemia -continue medications, check lipids, decrease fatty foods, increase activity.  -     Lipid panel  H/O left bundle branch block Continue cardio follow up  ANXIETY DEPRESSION Continue meds  History of pancreatitis Monitor, no ETOH  Hx of  -  Malignant neoplasm of lower-inner quadrant of left breast in female, estrogen receptor positive  S/p lumpectomy and 5 year anastrozole, has been released Monitoring by annual mammograms   Morbid Obesity with co morbidities ([place])  - long discussion about weight loss, diet, and exercise   Prediabetes Discussed general issues about diabetes pathophysiology and management., Educational  material distributed., Suggested low cholesterol diet., Encouraged aerobic exercise., Discussed foot care., Reminded to get yearly retinal exam. -     Hemoglobin A1c  Vitamin D deficiency At goal at recent check; continue to recommend supplementation for goal of 60-100 Defer vitamin D level  History of ulcer disease Monitor, avoid NSAIDS, continue GERD meds  CKD III Increase fluids, avoid NSAIDS, monitor sugars, will monitor  Anemia Chronic, stable, monitor; recent iron was normal, stop supplement, get on B12 sublingual which was low. UTD on colonoscopy.   Excessive flatulence Discussed increasing activity Monitor food intake Add Probiotic daily Consider adding metamucil to bulk stool   Further disposition pending results if labs check today. Discussed med's effects and SE's.   Over 40 minutes of face to face interview, exam, counseling, chart review, and critical decision making was performed.   Future Appointments  Date Time Provider Council  04/27/2020  2:30 PM Garnet Sierras, NP [facility] None  05/05/2020  1:40 PM Leonie Man, MD [facility]  01/21/2021 10:00 AM Liane Comber, NP [facility] None      Subjective:  [name] [name] is a 74 y.o. female who presents for complete physical.   She had covid 19 with resp failure requiring admission in 12/24-12/30/20, with significant slow recovering fatigue, significantly improved.   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   She has hx of R breast cancer in 2015, s/p lumpectomy, completed 5 years of anastrozole and was released by Dr. Jana Hakim, now following with annual mammograms.   She follows with Dr. Verl Blalock (pain management) for cervical and lower back pain with radiculopathy and gets epidural injections though reports hasn't been recently. She also has knee pain seen by Dr. [name]. Has discussed spine surgery but would prefer  to avoid.   she has a diagnosis of depression/anxiety and is  currently on wellbutrin 150 mg, cymbalta 60 mg, reports symptoms are typically well controlled with this regimen, more down in recent weeks due to several family deaths in recent months. she also takes temazepam PRN insomnia daily, hasn't done well with attempts to taper.   she has a diagnosis of GERD with history of ulcer, which is currently managed by omeprazole 40 mg and famotidine PRN breakthrough.  she reports symptoms is currently well controlled, and denies breakthrough reflux, burning in chest, hoarseness or cough.  She is having excessive flatulance that is new.  She denies any new diet change and typically does not spicy foods.  She denies any nausea of vomiting.  t is constant regardless of what she eats and is also having some abdominal bloating and cramping.  She has tried OTC gas-X and reports this has not helped.  She does report and increase in gas.  She reports sis doesn't; matter what she eats or drinks.  She is drinking at least 3, 18oz bottles of water.  She drink one cup of coffee in the mornings.  She though it might be wheat.  She stopped eating those foods but it did not make a difference.  She has tried altering her diet but it does not make a difference.   BMI is Body mass index is 37.28 kg/m., she has not been working on diet and exercise, works all day on her feet with walking and lifting at [employer].  Wt Readings from Last 3 Encounters:  01/22/20 224 lb (101.6 kg)  09/10/19 224 lb (101.6 kg)  07/04/19 229 lb (103.9 kg)   She has hx of a. Fib/PSVT, on elequis, non-ischemic cardiomyopathy, diastolic CHF improved on ARB/BB, most recent ECHO with EF 60-65%, followed by cardiology Dr. Ellyn Hack.   Had recent benign holter results in Feb 2021.   Her blood pressure has been controlled at home, today their BP is BP: 120/80 She does not workout. She denies chest pain, shortness of breath, dizziness.   She is on cholesterol medication (rosuvastatin 40 mg daily) and denies  myalgias. Her cholesterol is at goal. The cholesterol last visit was:   Lab Results  Component Value Date   CHOL 123 01/22/2020   HDL 55 01/22/2020   LDLCALC 49 01/22/2020   TRIG 102 01/22/2020   CHOLHDL 2.2 01/22/2020    She has not been working on diet and exercise for prediabetes, and denies foot ulcerations, increased appetite, nausea, paresthesia of the feet, polydipsia, polyuria, visual disturbances, vomiting and weight loss. Last A1C in the office was:  Lab Results  Component Value Date   HGBA1C 6.3 (H) 09/10/2019   She has CKD III monitored at this office. Last GFR:  Lab Results  Component Value Date   GFRNONAA 43 (L) 01/22/2020   Patient is on Vitamin D supplement.   Lab Results  Component Value Date   VD25OH 59 01/17/2019       Medication Review: Current Outpatient Medications on File Prior to Visit  Medication Sig Dispense Refill  . acetaminophen (TYLENOL) 500 MG tablet Take 1,000 mg by mouth every 6 (six) hours as needed for moderate pain.    Marland Kitchen apixaban (ELIQUIS) 5 MG TABS tablet Take 1 tablet (5 mg total) by mouth 2 (two) times daily. 180 tablet 1  . buPROPion (WELLBUTRIN XL) 150 MG 24 hr tablet TAKE 1 TABLET BY MOUTH EVERY DAY IN THE MORNING 90 tablet 1  .  Cholecalciferol (VITAMIN D) 2000 UNITS tablet Take 4,000 Units by mouth daily.    . Coenzyme Q10 (COQ10) 100 MG CAPS Take 300 mg by mouth daily. 90 each 11  . DULoxetine (CYMBALTA) 30 MG capsule Take 30 mg by mouth 2 (two) times daily.    Marland Kitchen gabapentin (NEURONTIN) 300 MG capsule Take 1 capsule 2 x  /day for Chronic Pain 180 capsule 0  . ketoconazole (NIZORAL) 2 % shampoo Apply 1 application topically 2 (two) times a week. 120 mL 0  . levothyroxine (SYNTHROID) 50 MCG tablet TAKE 1/2-1 TABLET DAILY ON EMPTY STOMACH W/ WATER FOR 30 MINS NO ANTACID/MEDS/CALCIUM/MAG. X 4 HOURS 90 tablet 3  . losartan (COZAAR) 100 MG tablet TAKE 1 TABLET BY MOUTH EVERY DAY 90 tablet 2  . meclizine (ANTIVERT) 25 MG tablet 1/2-1 pill up  to 3 times daily for motion sickness/dizziness. Caution, may cause drowsiness. 60 tablet 0  . mometasone (NASONEX) 50 MCG/ACT nasal spray Use 1-2 sprays  to each nostril 1 to 2 x /day 51 g 3  . neomycin-polymyxin-hydrocortisone (CORTISPORIN) OTIC solution Apply 1-2 drops to toe BID after soaking 10 mL 1  . nystatin ointment (MYCOSTATIN) Apply 1 application topically 2 (two) times daily. 30 g 0  . omeprazole (PRILOSEC) 40 MG capsule TAKE 1 CAPSULE TWICE DAILY FOR ACID INDIGESTION & REFLUX. 180 capsule 3  . potassium chloride SA (KLOR-CON M20) 20 MEQ tablet TAKE ONE TABLET DAILY OR AS DIRECTED BY YOUR PROVIDER. 90 tablet 1  . rosuvastatin (CRESTOR) 40 MG tablet TAKE 1 TABLET EVERY DAY, EXCEPT TAKE 2 TABLETS ON MONDAY 102 tablet 2  . temazepam (RESTORIL) 30 MG capsule Take 1 cap prior to bedtime as needed for sleep. Avoid taking daily due to addictive potential. 90 capsule 0  . triamcinolone ointment (KENALOG) 0.1 % Apply 1 application topically 2 (two) times daily. 80 g 1  . furosemide (LASIX) 20 MG tablet Take 1 tablet (20 mg total) by mouth 2 (two) times daily. 180 tablet 3  . ipratropium (ATROVENT) 0.03 % nasal spray Place 2 sprays into the nose 3 (three) times daily. 54 mL 3  . metoprolol succinate (TOPROL-XL) 50 MG 24 hr tablet Take 1 tablet (50 mg total) by mouth daily. Take with or immediately following a meal. 90 tablet 3   No current facility-administered medications on file prior to visit.    Allergies  Allergen Reactions  . Requip [Ropinirole Hcl] Shortness Of Breath and Nausea And Vomiting  . Minocycline Hives  . Tetracyclines & Related Hives  . Zestril [Lisinopril] Cough  . Zetia [Ezetimibe] Other (See Comments)    MYALGIA JOINT PAIN  . Amoxicillin Rash    Did it involve swelling of the face/tongue/throat, SOB, or low BP? No Did it involve sudden or severe rash/hives, skin peeling, or any reaction on the inside of your mouth or nose? No Did you need to seek medical attention at  a hospital or doctor's office? No When did it last happen?>10years If all above answers are "NO", may proceed with cephalosporin use.   . Codeine Other (See Comments)    fatigue  . Morphine Sulfate Other (See Comments)    Palpitations  . Sulfa Antibiotics Other (See Comments)    Headache (pt states that a blood pressure medicine caused a severe headache but doesn't remember a reaction to sulfa)    Current Problems (verified) Patient Active Problem List   Diagnosis Date Noted  . History of breast cancer 09/06/2019  . 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 Bradycardia   . History of COVID-19   . CKD (chronic kidney disease) stage 3, GFR 30-59 ml/min (HCC) 09/21/2018  . Morbid obesity ([place]) 06/14/2017  . Primary osteoarthritis of left knee 09/23/2016  . Non-ischemic cardiomyopathy ([place])   . Chronic combined systolic (congestive) and diastolic (congestive) heart failure ([place]) 08/08/2015  . Paroxysmal atrial fibrillation ([place]): CHA2DS2-VASc Score  4 ->  Eliquis 08/06/2015  . Hypothyroidism 08/06/2015  . Absolute anemia   . Encounter for Medicare annual wellness exam 03/31/2015  . DDD (degenerative disc disease), lumbar 06/16/2014  . Other abnormal glucose (prediabetes) 12/18/2013  . Vitamin D deficiency 12/18/2013  . Medication management 12/18/2013  . Paroxysmal supraventricular tachycardia ([place]) 10/18/2013  . Radiculopathy 10/16/2013  . Hyperlipidemia 03/13/2008  . Depression, major, recurrent, in partial remission ([place]) 03/13/2008  . Hypertension 03/13/2008  . H/O left bundle branch block 03/13/2008  . Allergic rhinitis 03/13/2008  . GERD 03/13/2008    Screening Tests Health Maintenance  Topic Date Due  . MAMMOGRAM  06/18/2021  . COLONOSCOPY  03/20/2023  . TETANUS/TDAP  09/23/2028  . INFLUENZA VACCINE  Completed  . DEXA SCAN  Completed  . COVID-19 Vaccine  Completed  . Hepatitis C Screening  Completed  . PNA vac Low Risk Adult  Completed    Immunization  History  Administered Date(s) Administered  . Fluad Quad(high Dose 65+) 01/15/2020  . Influenza Whole 01/01/2013  . Influenza, High Dose Seasonal PF 01/29/2014, 12/30/2014, 01/17/2019  . Influenza,inj,Quad PF,6+ Mos 01/18/2018  . Influenza-Unspecified 12/11/2015, 02/04/2017, 01/18/2018  . PFIZER SARS-COV-2 Vaccination 05/18/2019, 06/10/2019  . Pneumococcal Conjugate-13 06/16/2014  . Pneumococcal Polysaccharide-23 05/23/2011  . Td 09/24/2018  . Tdap 03/11/2008  . Zoster 02/02/2010   Preventative care: Last colonoscopy: 2014 Last mammogram: 06/2019 (05/06/2013 + right breast cancer s/p lumpectomy) Last pap smear/pelvic exam: remote   DEXA: 05/2018 - L forearm T -1.4, otherwise in normal range Renal US 08/2015 Echo 2020 Cath 2017   Prior vaccinations: TD or Tdap: 2020 Influenza: 01/2020 Pneumococcal: 2013 Prevnar 13: 2016 Shingles/Zostavax: 2011, insurance won't cover at this time, declines shingrix  Covid 19: 2/2, 2021, pfizer   Names of Other Physician/Practitioners you currently use: 1. [facility] here for primary care 2. Dr Nicki Reaper, eye doctor, last visit July 2020 3. [facility], dentist, last visit 2021, q 6 months  Patient Care Team: Unk Pinto, MD as PCP - General (Internal Medicine) Leonie Man, MD as PCP - Cardiology (Cardiology) Magrinat, Virgie Dad, MD as Consulting Physician (Oncology) Leonie Man, MD as Consulting Physician (Cardiology) Dorna Leitz, MD as Consulting Physician (Orthopedic Surgery) Rolm Bookbinder, MD as Consulting Physician (General Surgery) Eppie Gibson, MD as Attending Physician (Radiation Oncology)  SURGICAL HISTORY She  has a past surgical history that includes Cataract extraction bilateral w/ anterior vitrectomy (Bilateral); Abdominal hysterectomy; Cholecystectomy; Knee arthroscopy (Bilateral); Tonsillectomy; Colonoscopy (2014); Upper gi endoscopy; Dupuytren / palmar  fasciotomy (Bilateral, 2007); Tubal ligation; epidural injections; Lumbar fusion (10/2013); Cardiac catheterization (N/A, 08/10/2015); transthoracic echocardiogram (08/07/2015); transthoracic echocardiogram (10/2015); Joint replacement; Total knee arthroplasty (Left, 09/23/2016); Breast lumpectomy (Left); Foot Tendon Surgery (Right, ~ 11/2015); Total knee arthroplasty (Left, 09/23/2016); and transthoracic echocardiogram (04/05/2019). FAMILY HISTORY Her family history includes COPD in her father; Heart attack in her mother; Hyperlipidemia in her sister; Hypertension in her father; Stroke in her mother. SOCIAL HISTORY She  reports that she has never smoked. She has never used smokeless tobacco. She reports current alcohol use. She reports  that she does not use drugs.     Cognitive Testing  Alert? Yes  Normal Appearance?Yes  Oriented to person? Yes  Place? Yes   Time? Yes  Recall of three objects?  Yes  Can perform simple calculations? Yes  Displays appropriate judgment?Yes  Can read the correct time from a watch face?Yes  EOL planning:    Review of Systems  Constitutional: Negative for malaise/fatigue and weight loss.  HENT: Negative for hearing loss and tinnitus.   Eyes: Negative for blurred vision and double vision.  Respiratory: Negative for cough, sputum production, shortness of breath and wheezing.   Cardiovascular: Negative for chest pain, palpitations, orthopnea, claudication, leg swelling and PND.  Gastrointestinal: Negative for abdominal pain, blood in stool, constipation, diarrhea, heartburn, melena, nausea and vomiting.  Genitourinary: Negative.   Musculoskeletal: Negative for falls, joint pain and myalgias.  Skin: Negative for rash.  Neurological: Negative for dizziness, tingling, sensory change, weakness and headaches.  Endo/Heme/Allergies: Negative for polydipsia.  Psychiatric/Behavioral: Negative.  Negative for depression, memory loss, substance abuse and suicidal ideas. The  patient is not nervous/anxious and does not have insomnia.   All other systems reviewed and are negative.    Objective:     Today's Vitals   01/22/20 1414  BP: 120/80  Pulse: 66  Temp: (!) 97.3 F (36.3 C)  SpO2: 97%  Weight: 224 lb (101.6 kg)  Height: 5\' 5"  (1.651 m)   Body mass index is 37.28 kg/m.  General appearance: alert, no distress, WD/WN, female HEENT: normocephalic, sclerae anicteric, TMs pearly, nares patent, no discharge or erythema, pharynx normal Oral cavity: MMM, no lesions Neck: supple, no lymphadenopathy, no thyromegaly, no masses Heart: RRR, normal S1, S2, no murmurs  Lungs: CTA bilaterally, no wheezes, rhonchi, or rales Abdomen: +bs, soft, non tender, non distended, no masses, no hepatomegaly, no splenomegaly Musculoskeletal: nontender, no swelling, no obvious deformity Extremities: no edema, no cyanosis, no clubbing Pulses: 2+ symmetric, upper and lower extremities, normal cap refill Neurological: alert, oriented x 3, CN2-12 intact, strength normal upper extremities and lower extremities, sensation normal throughout, DTRs 2+ throughout, no cerebellar signs, gait normal Psychiatric: normal affect, behavior normal, pleasant    EKG: LBBB    Bayard Males, DNP [facility] 01/22/2020  2:59 PM

## 2020-01-23 LAB — NO CULTURE INDICATED

## 2020-01-23 LAB — COMPLETE METABOLIC PANEL WITH GFR
AG Ratio: 1.6 (calc) (ref 1.0–2.5)
ALT: 7 U/L (ref 6–29)
AST: 11 U/L (ref 10–35)
Albumin: 4.1 g/dL (ref 3.6–5.1)
Alkaline phosphatase (APISO): 86 U/L (ref 37–153)
BUN/Creatinine Ratio: 18 (calc) (ref 6–22)
BUN: 22 mg/dL (ref 7–25)
CO2: 26 mmol/L (ref 20–32)
Calcium: 9.2 mg/dL (ref 8.6–10.4)
Chloride: 102 mmol/L (ref 98–110)
Creat: 1.25 mg/dL — ABNORMAL HIGH (ref 0.60–0.93)
GFR, Est African American: 49 mL/min/{1.73_m2} — ABNORMAL LOW (ref >=60)
GFR, Est Non African American: 43 mL/min/{1.73_m2} — ABNORMAL LOW (ref >=60)
Globulin: 2.6 g/dL (calc) (ref 1.9–3.7)
Glucose, Bld: 77 mg/dL (ref 65–99)
Potassium: 3.8 mmol/L (ref 3.5–5.3)
Sodium: 138 mmol/L (ref 135–146)
Total Bilirubin: 0.4 mg/dL (ref 0.2–1.2)
Total Protein: 6.7 g/dL (ref 6.1–8.1)

## 2020-01-23 LAB — LIPID PANEL
Cholesterol: 123 mg/dL (ref <200)
HDL: 55 mg/dL (ref >=50)
LDL Cholesterol (Calc): 49 mg/dL (calc)
Non-HDL Cholesterol (Calc): 68 mg/dL (calc) (ref <130)
Total CHOL/HDL Ratio: 2.2 (calc) (ref <5.0)
Triglycerides: 102 mg/dL (ref <150)

## 2020-01-23 LAB — CBC WITH DIFFERENTIAL/PLATELET
Absolute Monocytes: 704 cells/uL (ref 200–950)
Basophils Absolute: 61 cells/uL (ref 0–200)
Basophils Relative: 0.6 %
Eosinophils Absolute: 235 cells/uL (ref 15–500)
Eosinophils Relative: 2.3 %
HCT: 30.3 % — ABNORMAL LOW (ref 35.0–45.0)
Hemoglobin: 9.4 g/dL — ABNORMAL LOW (ref 11.7–15.5)
Lymphs Abs: 2142 cells/uL (ref 850–3900)
MCH: 27.2 pg (ref 27.0–33.0)
MCHC: 31 g/dL — ABNORMAL LOW (ref 32.0–36.0)
MCV: 87.6 fL (ref 80.0–100.0)
MPV: 11.1 fL (ref 7.5–12.5)
Monocytes Relative: 6.9 %
Neutro Abs: 7058 cells/uL (ref 1500–7800)
Neutrophils Relative %: 69.2 %
Platelets: 337 10*3/uL (ref 140–400)
RBC: 3.46 10*6/uL — ABNORMAL LOW (ref 3.80–5.10)
RDW: 13.5 % (ref 11.0–15.0)
Total Lymphocyte: 21 %
WBC: 10.2 10*3/uL (ref 3.8–10.8)

## 2020-01-23 LAB — URINALYSIS W MICROSCOPIC + REFLEX CULTURE
Bacteria, UA: NONE SEEN /HPF
Bilirubin Urine: NEGATIVE
Glucose, UA: NEGATIVE
Hgb urine dipstick: NEGATIVE
Hyaline Cast: NONE SEEN /LPF
Ketones, ur: NEGATIVE
Leukocyte Esterase: NEGATIVE
Nitrites, Initial: NEGATIVE
RBC / HPF: NONE SEEN /HPF (ref 0–2)
Specific Gravity, Urine: 1.006 (ref 1.001–1.03)
Squamous Epithelial / HPF: NONE SEEN /HPF (ref <=5)
WBC, UA: NONE SEEN /HPF (ref 0–5)
pH: 6 (ref 5.0–8.0)

## 2020-01-23 LAB — TSH: TSH: 1.37 mIU/L (ref 0.40–4.50)

## 2020-01-23 LAB — PTH, INTACT AND CALCIUM
Calcium: 9.2 mg/dL (ref 8.6–10.4)
PTH: 104 pg/mL — ABNORMAL HIGH (ref 14–64)

## 2020-02-07 ENCOUNTER — Other Ambulatory Visit: Payer: Self-pay | Admitting: Internal Medicine

## 2020-02-27 ENCOUNTER — Other Ambulatory Visit: Payer: Self-pay | Admitting: Adult Health

## 2020-03-04 ENCOUNTER — Telehealth: Payer: Self-pay | Admitting: *Deleted

## 2020-03-04 NOTE — Telephone Encounter (Signed)
   Primary Cardiologist: Glenetta Hew, MD  Chart reviewed as part of pre-operative protocol coverage. Patient last seen by Dr. Ellyn Hack in 06/2019. Patient was contacted today for further pre-op evaluation and reported doing well since last visit. No chest pain, significant shortness of breath, orthopnea, PND, edema, palpitations, syncope. She is able to complete >4.0 METs. Given past medical history and time since last visit, based on ACC/AHA guidelines, GRENDA LORA would be at acceptable risk for the planned procedure without further cardiovascular testing.   Per Pharmacy and office protocol: Patient can hold Eliquis for 2-3 days prior to procedure. If general anesthesia is used only a 2 day hold is needed. If spinal anesthesia is an option, then patient would need to hold 3 days. Eliquis should be restarted as soon as possible postoperatively.  I will route this recommendation to the requesting party via Epic fax function and remove from pre-op pool.  Please call with questions.  Darreld Mclean, PA-C 03/04/2020, 11:56 AM

## 2020-03-04 NOTE — Telephone Encounter (Signed)
   [facility] Pre-operative Risk Assessment     Request for surgical clearance:  1. What type of surgery is being performed? RIGHT KNEE ARTHROSCOPY    2. When is this surgery scheduled? 04/08/2020   3. What type of clearance is required (medical clearance vs. Pharmacy clearance to hold med vs. Both)? ELIQUIS  4. Are there any medications that need to be held prior to surgery and how long?   5. Practice name and name of physician performing surgery? [facility]   6. What is the office phone number? [phone]   7.   What is the office fax number? [place]   Anesthesia type (None, local, MAC, general) ? CHOICE

## 2020-03-04 NOTE — Telephone Encounter (Signed)
Pharmacy, can you please comment on how long patient can hold Eliquis for upcoming knee procedure?  Thank you!

## 2020-03-04 NOTE — Telephone Encounter (Signed)
Patient with diagnosis of afib [name] for anticoagulation.    Procedure: RIGHT KNEE ARTHROSCOPY   Date of procedure: 04/08/20    CHA2DS2-VASc Score = 4  This indicates a 4.8% annual risk of stroke. The patient's score is based upon: CHF History: 1 HTN History: 1 Diabetes History: 0 Stroke History: 0 Vascular Disease History: 0 Age Score: 1 Gender Score: 1      CrCl 47 ml/min  Per office protocol, patient can hold Eliquis for 3 days prior to procedure.   If general anesthesia is used only a 2 day hold is needed If spinal anesthesia is an option then pt would need to hold 3 days

## 2020-03-16 ENCOUNTER — Encounter: Payer: Self-pay | Admitting: Adult Health Nurse Practitioner

## 2020-03-16 ENCOUNTER — Other Ambulatory Visit: Payer: Self-pay | Admitting: Adult Health Nurse Practitioner

## 2020-03-16 DIAGNOSIS — D649 Anemia, unspecified: Secondary | ICD-10-CM

## 2020-03-17 ENCOUNTER — Other Ambulatory Visit: Payer: Self-pay

## 2020-03-17 ENCOUNTER — Ambulatory Visit: Payer: Medicare Other

## 2020-03-17 DIAGNOSIS — D649 Anemia, unspecified: Secondary | ICD-10-CM

## 2020-03-17 NOTE — Progress Notes (Signed)
Patient presents to the office for a nurse visit to have labs checked. Patient states that she has been experiencing unsteadiness, "everything turns black". Provider aware and checked her ears for fluid. Very little seen, canals were clear. Patient given zyrtec to try and advised if this episode happens again she needs to seek immediate medical attention. Vitals taken and recorded.

## 2020-03-18 LAB — CBC WITH DIFFERENTIAL/PLATELET
Absolute Monocytes: 834 cells/uL (ref 200–950)
Basophils Absolute: 103 cells/uL (ref 0–200)
Basophils Relative: 1.2 %
Eosinophils Absolute: 189 cells/uL (ref 15–500)
Eosinophils Relative: 2.2 %
HCT: 31.3 % — ABNORMAL LOW (ref 35.0–45.0)
Hemoglobin: 9.9 g/dL — ABNORMAL LOW (ref 11.7–15.5)
Lymphs Abs: 1815 cells/uL (ref 850–3900)
MCH: 26.8 pg — ABNORMAL LOW (ref 27.0–33.0)
MCHC: 31.6 g/dL — ABNORMAL LOW (ref 32.0–36.0)
MCV: 84.6 fL (ref 80.0–100.0)
MPV: 11.1 fL (ref 7.5–12.5)
Monocytes Relative: 9.7 %
Neutro Abs: 5659 cells/uL (ref 1500–7800)
Neutrophils Relative %: 65.8 %
Platelets: 400 10*3/uL (ref 140–400)
RBC: 3.7 10*6/uL — ABNORMAL LOW (ref 3.80–5.10)
RDW: 13.6 % (ref 11.0–15.0)
Total Lymphocyte: 21.1 %
WBC: 8.6 10*3/uL (ref 3.8–10.8)

## 2020-03-18 LAB — IRON, TOTAL/TOTAL IRON BINDING CAP
%SAT: 7 % (calc) — ABNORMAL LOW (ref 16–45)
Iron: 31 ug/dL — ABNORMAL LOW (ref 45–160)
TIBC: 420 mcg/dL (calc) (ref 250–450)

## 2020-04-22 ENCOUNTER — Other Ambulatory Visit: Payer: Self-pay | Admitting: Cardiology

## 2020-04-27 ENCOUNTER — Ambulatory Visit: Payer: Medicare Other | Admitting: Adult Health Nurse Practitioner

## 2020-04-29 ENCOUNTER — Other Ambulatory Visit: Payer: Self-pay | Admitting: Cardiology

## 2020-05-05 ENCOUNTER — Other Ambulatory Visit: Payer: Self-pay

## 2020-05-05 ENCOUNTER — Encounter: Payer: Self-pay | Admitting: Cardiology

## 2020-05-05 ENCOUNTER — Ambulatory Visit: Payer: Medicare Other | Admitting: Cardiology

## 2020-05-05 VITALS — BP 120/60 | HR 73 | Ht 65.0 in | Wt 232.0 lb

## 2020-05-05 DIAGNOSIS — I5032 Chronic diastolic (congestive) heart failure: Secondary | ICD-10-CM | POA: Diagnosis not present

## 2020-05-05 DIAGNOSIS — I428 Other cardiomyopathies: Secondary | ICD-10-CM | POA: Diagnosis not present

## 2020-05-05 DIAGNOSIS — I493 Ventricular premature depolarization: Secondary | ICD-10-CM

## 2020-05-05 DIAGNOSIS — Z8679 Personal history of other diseases of the circulatory system: Secondary | ICD-10-CM

## 2020-05-05 DIAGNOSIS — E782 Mixed hyperlipidemia: Secondary | ICD-10-CM

## 2020-05-05 DIAGNOSIS — I480 Paroxysmal atrial fibrillation: Secondary | ICD-10-CM

## 2020-05-05 DIAGNOSIS — I10 Essential (primary) hypertension: Secondary | ICD-10-CM

## 2020-05-05 MED ORDER — LOSARTAN POTASSIUM 100 MG PO TABS
100.0000 mg | ORAL_TABLET | Freq: Every day | ORAL | 3 refills | Status: DC
Start: 2020-05-05 — End: 2021-06-24

## 2020-05-05 MED ORDER — FUROSEMIDE 20 MG PO TABS: 20.0000 mg | ORAL_TABLET | Freq: Two times a day (BID) | ORAL | 3 refills | Status: DC

## 2020-05-05 MED ORDER — APIXABAN 5 MG PO TABS
5.0000 mg | ORAL_TABLET | Freq: Two times a day (BID) | ORAL | 3 refills | Status: DC
Start: 2020-05-05 — End: 2021-06-04

## 2020-05-05 MED ORDER — METOPROLOL SUCCINATE ER 50 MG PO TB24
ORAL_TABLET | ORAL | 3 refills | Status: DC
Start: 2020-05-05 — End: 2021-05-27

## 2020-05-05 MED ORDER — ROSUVASTATIN CALCIUM 40 MG PO TABS: ORAL_TABLET | ORAL | 2 refills | Status: DC

## 2020-05-05 NOTE — Progress Notes (Signed)
Primary Care Provider: Unk Pinto, MD Cardiologist: Glenetta Hew, MD Electrophysiologist: None  Clinic Note: Chief Complaint  Patient presents with  . Follow-up    No major cardiac symptoms.  Rare palpitations.  . Atrial Fibrillation    No recurrent symptoms.  Rare palpitations   ===================================  ASSESSMENT/PLAN   Problem List Items Addressed This Visit    Paroxysmal atrial fibrillation [facility]): CHA2DS2-VASc Score  4 ->  Eliquis (Chronic)    I cannot recall the last episode of A. fib she had.  She does not necessarily notice when she has recurrent A. fib, and rarely notes palpitations.  As such, she is stable [name], and only using beta-blocker for rate control.  Tolerating Eliquis well.  Okay to hold for procedures.      Relevant Medications   apixaban (ELIQUIS) 5 MG TABS tablet   furosemide (LASIX) 20 MG tablet   losartan (COZAAR) 100 MG tablet   metoprolol succinate (TOPROL-XL) 50 MG 24 hr tablet   rosuvastatin (CRESTOR) 40 MG tablet   Non-ischemic cardiomyopathy (HCC) (Chronic)    This seems to have improved on on her feet most recent echo.  No active heart failure symptoms.  She does have some edema but without PND orthopnea this is probably due to her venous stasis.  Plan: Continue ARB and beta-blocker.  We were able to convert her to Toprol, and she is doing well.  We will reduce her back down to 20 tablets a day furosemide with an additional dose as needed.  (However continue the prescriptions written as to 20 mg tablets daily as directed)      Relevant Medications   apixaban (ELIQUIS) 5 MG TABS tablet   furosemide (LASIX) 20 MG tablet   losartan (COZAAR) 100 MG tablet   metoprolol succinate (TOPROL-XL) 50 MG 24 hr tablet   rosuvastatin (CRESTOR) 40 MG tablet   Hyperlipidemia (Chronic)    Most recent lipids as of October showed that her LDL was excellent at 49 on her 40 mg of a rosuvastatin with the extra dose.      Relevant  Medications   apixaban (ELIQUIS) 5 MG TABS tablet   furosemide (LASIX) 20 MG tablet   losartan (COZAAR) 100 MG tablet   metoprolol succinate (TOPROL-XL) 50 MG 24 hr tablet   rosuvastatin (CRESTOR) 40 MG tablet   Essential hypertension (Chronic)    Stable blood pressures on current dose of losartan Toprol.  No change.      Relevant Medications   apixaban (ELIQUIS) 5 MG TABS tablet   furosemide (LASIX) 20 MG tablet   losartan (COZAAR) 100 MG tablet   metoprolol succinate (TOPROL-XL) 50 MG 24 hr tablet   rosuvastatin (CRESTOR) 40 MG tablet   (HFpEF) heart failure with preserved ejection fraction (HCC) - Primary (Chronic)    Chronic HFpEF with improved EF.  She does have some minimal edema.   With well-controlled edema, I think she can probably back off on Lasix a little bit to 20 mg daily an additional 20 mg as needed.  We will continue the prescription as is originally written just in case she does need the additional doses more frequently.  She is on Toprol losartan at stable doses.      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  Doing better on Toprol.      Relevant Medications   apixaban (ELIQUIS) 5 MG TABS tablet   furosemide (LASIX) 20 MG tablet   losartan (COZAAR) 100 MG tablet   metoprolol succinate (TOPROL-XL) 50 MG 24 hr tablet   rosuvastatin (CRESTOR) 40 MG tablet   H/O left bundle branch block (Chronic)    Based on the pulmonary block with no clear evidence of LV dysfunction.  Nonischemic cath.      Morbid obesity (HCC) (Chronic)    BMI is just below the comorbid level but with her risk factors would be considered morbid.  We talked  for the importance of her trying to increase her exercise level and adjust her diet to lose weight.        ===================================  HPI:    [name] [name] is a 75 y.o. female with a PMH notable for mild nonischemic cardiomyopathy with EF of 40 to 27% and Diastolic Dysfunction (NYHA class II), LBBB, PAF ([name]), CKD-3 who presents today for early annual follow-up.  SAYLOR MURRY was last seen on July 04, 2019 in follow-up after wearing a monitor showing short SVT bursts with PVCs both bigeminy and trigeminy.  She was finally started we feel back to normal after Covid infection.  She had to wait for Covid vaccine.  Still noting a little bit of exertional dyspnea, but getting better.  No sensation of A. fib.  She did have issues when she was in the hospital.  Converted from Lopressor to Toprol 50 mg and increase furosemide to 40 mg daily.  Recent Hospitalizations:   April 08, 2020 Right Knee Arthroplasty for Meniscal Tear  Reviewed  CV studies:    The following studies were reviewed today: (if available, images/films reviewed: From Epic Chart or Care Everywhere) . None:   Interval History:   [name] [name] returns today overall doing pretty well.  She had her knee surgery back in December and is still somewhat limited as far as mobility.  Thankfully though the pain is better.  She had a really bad episode of vertigo just a few weeks prior to her surgery.  She had spell from the seventh to 9 December with nausea fatigue, poor p.o. intake and poor balance.  This is all resolved. From a cardiac standpoint, she barely notices any palpitations.  Really this is off and on, and usually only occur when she is sedentary.  She denies any heart failure symptoms or angina symptoms.  No real sensation of atrial fibrillation.  No bleeding issues [name].  Her edema is very well controlled, and wonders if she needs to take as much Lasix that she is taking.  CV Review of  Symptoms (Summary): no chest pain or dyspnea on exertion positive for - edema, palpitations and Some mild exertional dyspnea if she overdoes it but not with routine activity.  Vertigo-dizziness but no syncope or near syncope. negative for - orthopnea, paroxysmal nocturnal dyspnea, shortness of breath or TIA or amaurosis fugax, claudication  The patient does not have symptoms concerning for COVID-19 infection (fever, chills, cough, or new shortness of breath).   REVIEWED OF SYSTEMS   Review of Systems  Constitutional: Negative for malaise/fatigue and weight loss (Gained weight in the holiday season with her knee surgery).  HENT: Negative for congestion and nosebleeds.   Respiratory:  Negative for shortness of breath.   Gastrointestinal: Negative for blood in stool and melena.  Genitourinary: Negative for hematuria.  Musculoskeletal: Positive for joint pain (Knee is feeling better but still hurts).  Neurological: Positive for dizziness (Prolonged episodes of vertigo in early December, doing better now).  Psychiatric/Behavioral: Negative for depression and memory loss. The patient is not nervous/anxious and does not have insomnia.    I have reviewed and (if needed) personally updated the patient's problem list, medications, allergies, past medical and surgical history, social and family history.   PAST MEDICAL HISTORY   Past Medical History:  Diagnosis Date  . Anemia   . Anxiety   . Arthritis   . 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 Chronic back pain    cyst sitting on L4-5;slipped disc  . Depression    takes Cymbalta daily  . Diverticulosis   . Dysrhythmia   . GERD (gastroesophageal reflux disease)    takes Omeprazole daily  . Headache(784.0)    rare  . History of gastric ulcer at age 36  . History of pancreatitis 03/27/2008  . History of ulcer disease 08/06/2015  . Hyperlipidemia   . Hypertension    takes Cardura nightly and  Verapamil daily  . Hypothyroidism   . Insomnia    takes Restoril nightly  . Malignant neoplasm of lower-inner quadrant of left breast in female, estrogen receptor positive ([place]) 05/20/2013  . Nonischemic cardiomyopathy ([place]) 07/2015   Normal coronary arteries by cath. EF was 25-30% by echo. GR2 DD - likely related to LBBB in setting of A. fib  . Obesity (BMI 30-39.9) 06/11/2013  . Paroxysmal atrial fibrillation ([place]): CHA2DS2-VASc Score - starting Eliquis 08/06/2015   This patients CHA2DS2-VASc Score and unadjusted Ischemic Stroke Rate (% per year) is equal to 4.8 % stroke rate/year from a score of 4  Above score calculated as 1 point each if present [CHF, HTN, DM, Vascular=MI/PAD/Aortic Plaque, Age if 65-74, or Female]; 2 points each if present [Age > 75, or Stroke/TIA/TE]  . Pneumonia due to COVID-19 virus 04/05/2019  . Pneumonia due to COVID-19 virus 04/04/2019   Admitted December 24-30, 2020-acute hypoxic respite failure.  Marland Kitchen PONV (postoperative nausea and vomiting)   . S/P radiation therapy 07/18/2013-08/28/2013   1) Left Breast / 50 Gy in 25 fractions/ 2) Left Breast Boost / 10 Gy in 5 fractions  . Shingles     TTE 04/05/2019: EF 60 to 65% without wall motion normality other than LBBB related septal bounce..  GR 1 DD.  Mild LA dilation.  Trivial MR.  Frequent PVCs noted.  PAST SURGICAL HISTORY   Past Surgical History:  Procedure Laterality Date  . ABDOMINAL HYSTERECTOMY    . BREAST LUMPECTOMY Left    neg  . CARDIAC CATHETERIZATION N/A 08/10/2015   Procedure: Right/Left Heart Cath and Coronary Angiography;  Surgeon: Burnell Blanks, MD;  Location: [facility];  Service: Cardiovascular;: Nonobstructive CAD  . CATARACT EXTRACTION BILATERAL W/ ANTERIOR VITRECTOMY Bilateral   . CHOLECYSTECTOMY    . COLONOSCOPY  2014  . DUPUYTREN / PALMAR FASCIOTOMY Bilateral 2007   x 3 to left and once to the right   . epidural injections     x 3  . FOOT TENDON SURGERY Right ~ 11/2015    "stepped in a hole and tore 2 tendons"  . JOINT REPLACEMENT    . KNEE ARTHROSCOPY Bilateral   . LUMBAR FUSION  10/2013   Dr. Lynann Bologna  . TONSILLECTOMY    . TOTAL KNEE ARTHROPLASTY Left 09/23/2016  . TOTAL KNEE ARTHROPLASTY Left 09/23/2016   Procedure: TOTAL KNEE ARTHROPLASTY;  Surgeon: Dorna Leitz, MD;  Location: [facility];  Service: Orthopedics;  Laterality: Left;  . TRANSTHORACIC ECHOCARDIOGRAM  08/07/2015   Severely reduced LVEF at 25-30% with diffuse HK. GR 2 DD. Ventricular dyssynergy secondary to LBBB. Small to moderate pericardial effusion but no hemodynamic compromise  . TRANSTHORACIC ECHOCARDIOGRAM  10/2015    (EF up from 25-30%): - Left ventricle: Poor acoustic windows -  difficult to see.    EF estimated at 45-50% with inferoseptal and possible posterior hypokinesis. GR 1 DD. No pericardial effusion.  . TRANSTHORACIC ECHOCARDIOGRAM  04/05/2019   EF 60 to 65% without wall motion normality.  GR 1 DD.  Mild LA dilation.  Trivial MR.  Frequent PVCs noted.  . TUBAL LIGATION    . UPPER GI ENDOSCOPY      Immunization History  Administered Date(s) Administered  . Fluad Quad(high Dose 65+) 01/15/2020  . Influenza Whole 01/01/2013  . Influenza, High Dose Seasonal PF 01/29/2014, 12/30/2014, 01/17/2019  . Influenza,inj,Quad PF,6+ Mos 01/18/2018  . Influenza-Unspecified 12/11/2015, 02/04/2017, 01/18/2018  . PFIZER(Purple Top)SARS-COV-2 Vaccination 05/18/2019, 06/10/2019  . Pneumococcal Conjugate-13 06/16/2014  . Pneumococcal Polysaccharide-23 05/23/2011  . Td 09/24/2018  . Tdap 03/11/2008  . Zoster 02/02/2010    MEDICATIONS/ALLERGIES   Current Meds  Medication Sig  . acetaminophen (TYLENOL) 500 MG tablet Take 1,000 mg by mouth every 6 (six) hours as needed for moderate pain.  Marland Kitchen buPROPion (WELLBUTRIN XL) 150 MG 24 hr tablet TAKE 1 TABLET BY MOUTH EVERY DAY IN THE MORNING  . Cholecalciferol (VITAMIN D) 2000 UNITS tablet Take 4,000 Units by mouth daily.  . Coenzyme Q10 (COQ10) 100 MG  CAPS Take 300 mg by mouth daily.  . DULoxetine (CYMBALTA) 30 MG capsule Take 30 mg by mouth 2 (two) times daily.  Marland Kitchen gabapentin (NEURONTIN) 300 MG capsule TAKE 1 CAPSULE TWICE DAILY FOR CHRONIC PAIN  . ketoconazole (NIZORAL) 2 % shampoo Apply 1 application topically 2 (two) times a week.  . levothyroxine (SYNTHROID) 50 MCG tablet TAKE 1/2-1 TABLET DAILY ON EMPTY STOMACH W/ WATER FOR 30 MINS NO ANTACID/MEDS/CALCIUM/MAG. X 4 HOURS  . meclizine (ANTIVERT) 25 MG tablet 1/2-1 pill up to 3 times daily for motion sickness/dizziness. Caution, may cause drowsiness.  . mometasone (NASONEX) 50 MCG/ACT nasal spray Use 1-2 sprays  to each nostril 1 to 2 x /day  . neomycin-polymyxin-hydrocortisone (CORTISPORIN) OTIC solution Apply 1-2 drops to toe BID after soaking  . nystatin ointment (MYCOSTATIN) Apply 1 application topically 2 (two) times daily.  Marland Kitchen omeprazole (PRILOSEC) 40 MG capsule TAKE 1 CAPSULE TWICE DAILY FOR ACID INDIGESTION & REFLUX.  Marland Kitchen potassium chloride SA (KLOR-CON M20) 20 MEQ tablet TAKE ONE TABLET DAILY OR AS DIRECTED BY YOUR PROVIDER.  Marland Kitchen temazepam (RESTORIL) 30 MG capsule TAKE 1 CAPSULE PRIOR TO BEDTIME AS NEEDED FOR SLEEP. AVOID TAKING DAILY DUE TO ADDICTIVE POTENTIAL.  Marland Kitchen triamcinolone ointment (KENALOG) 0.1 % Apply 1 application topically 2 (two) times daily.  . [DISCONTINUED] apixaban (ELIQUIS) 5 MG TABS tablet Take 1 tablet (5 mg total) by mouth 2 (two) times daily.  . [DISCONTINUED] losartan (COZAAR) 100 MG tablet TAKE 1 TABLET BY MOUTH EVERY DAY  . [DISCONTINUED] metoprolol succinate (TOPROL-XL) 50 MG 24 hr tablet TAKE 1 TABLET BY MOUTH EVERY DAY WITH OR IMMEDIATELY FOLLOWING A MEAL  . [DISCONTINUED] rosuvastatin (CRESTOR) 40 MG  tablet TAKE 1 TABLET BY MOUTH EVERY DAY, TAKE 2 TABLETS BY MOUTH ON MONDAYS    Allergies  Allergen Reactions  . Requip [Ropinirole Hcl] Shortness Of Breath and Nausea And Vomiting  . Minocycline Hives  . Tetracyclines & Related Hives  . Zestril [Lisinopril]  Cough  . Zetia [Ezetimibe] Other (See Comments)    MYALGIA JOINT PAIN  . Codeine Other (See Comments)    fatigue  . Morphine Sulfate Other (See Comments)    Palpitations  . Sulfa Antibiotics Other (See Comments)    Headache (pt states that a blood pressure medicine caused a severe headache but doesn't remember a reaction to sulfa)    SOCIAL HISTORY/FAMILY HISTORY   Reviewed in Epic:  Pertinent findings:  Social History   Tobacco Use  . Smoking status: Never Smoker  . Smokeless tobacco: Never Used  Vaping Use  . Vaping Use: Never used  Substance Use Topics  . Alcohol use: Yes    Comment: wine occasionally-rarely  . Drug use: No   Social History   Social History Narrative  . Not on file    OBJCTIVE -PE, EKG, labs   Wt Readings from Last 3 Encounters:  05/05/20 232 lb (105.2 kg)  03/17/20 225 lb (102.1 kg)  01/22/20 224 lb (101.6 kg)    Physical Exam: BP 120/60 (BP Location: Left Arm, Patient Position: Sitting, Cuff Size: Large)   Pulse 73   Ht 5\' 5"  (1.651 m)   Wt 232 lb (105.2 kg)   SpO2 98%   BMI 38.61 kg/m  Physical Exam Vitals reviewed.  Constitutional:      General: She is not in acute distress.    Appearance: Normal appearance. She is obese. She is not ill-appearing or toxic-appearing.     Comments: Well-groomed, well-nourished  HENT:     Head: Normocephalic and atraumatic.  Neck:     Vascular: No carotid bruit, hepatojugular reflux or JVD.  Cardiovascular:     Rate and Rhythm: Normal rate and regular rhythm. Occasional extrasystoles are present.    Pulses: Intact distal pulses.     Heart sounds: S1 normal. Heart sounds are distant. No murmur heard. No friction rub. No gallop.   Pulmonary:     Effort: Pulmonary effort is normal. No respiratory distress.     Breath sounds: Normal breath sounds.  Chest:     Chest wall: No tenderness.  Musculoskeletal:        General: Swelling (Trivial) present. Normal range of motion.     Cervical back: Normal  range of motion and neck supple.  Neurological:     General: No focal deficit present.     Mental Status: She is alert and oriented to person, place, and time.  Psychiatric:        Mood and Affect: Mood normal.        Behavior: Behavior normal.        Thought Content: Thought content normal.        Judgment: Judgment normal.      Adult ECG Report n/a  Recent Labs:  reviewed  Lab Results  Component Value Date   CHOL 123 01/22/2020   HDL 55 01/22/2020   LDLCALC 49 01/22/2020   TRIG 102 01/22/2020   CHOLHDL 2.2 01/22/2020   Lab Results  Component Value Date   CREATININE 1.25 (H) 01/22/2020   BUN 22 01/22/2020   NA 138 01/22/2020   K 3.8 01/22/2020   CL 102 01/22/2020   CO2 26 01/22/2020  CBC Latest Ref Rng & Units 03/17/2020 01/22/2020 09/10/2019  WBC 3.8 - 10.8 Thousand/uL 8.6 10.2 9.0  Hemoglobin 11.7 - 15.5 g/dL 9.9(L) 9.4(L) 10.5(L)  Hematocrit 35.0 - 45.0 % 31.3(L) 30.3(L) 32.4(L)  Platelets 140 - 400 Thousand/uL 400 337 288    Lab Results  Component Value Date   TSH 1.37 01/22/2020    ==================================================  COVID-19 Education: The signs and symptoms of COVID-19 were discussed with the patient and how to seek care for testing (follow up with PCP or arrange E-visit).   The importance of social distancing and COVID-19 vaccination was discussed today. The patient is practicing social distancing & Masking.   I spent a total of 20 minutes with the patient spent in direct patient consultation.  Additional time spent with chart review  / charting (studies, outside notes, etc): 16 min Total Time: 36 min   Current medicines are reviewed at length with the patient today.  (+/- concerns) asking if she needs as much Lasix.  This visit occurred during the SARS-CoV-2 public health emergency.  Safety protocols were in place, including screening questions prior to the visit, additional usage of staff PPE, and extensive cleaning of exam room while  observing appropriate contact time as indicated for disinfecting solutions.  Notice: This dictation was prepared with Dragon dictation along with smaller phrase technology. Any transcriptional errors that result from this process are unintentional and may not be corrected upon review.  Patient Instructions / Medication Changes & Studies & Tests Ordered   Patient Instructions  Medication Instructions:   You can use  1 to 2 tablets  Furosemide daily for swelling   *If you need a refill on your cardiac medications before your next appointment, please call your pharmacy*   Lab Work:  Not needed   Testing/Procedures:  Not needed  Follow-Up: At [facility], you and your health needs are our priority.  As part of our continuing mission to provide you with exceptional heart care, we have created designated Provider Care Teams.  These Care Teams include your primary Cardiologist (physician) and Advanced Practice Providers (APPs -  Physician Assistants and Nurse Practitioners) who all work together to provide you with the care you need, when you need it.     Your next appointment:   12 month(s)  The format for your next appointment:   In Person  Provider:   Glenetta Hew, MD   Other Instructions     Studies Ordered:   No orders of the defined types were placed in this encounter.    Glenetta Hew, M.D., M.S. Interventional Cardiologist   Pager # [phone] Phone # [phone] [address] [place],  [address]   Thank you for choosing Heartcare at [facility]!!

## 2020-05-05 NOTE — Patient Instructions (Addendum)
Medication Instructions:   You can use  1 to 2 tablets  Furosemide daily for swelling   *If you need a refill on your cardiac medications before your next appointment, please call your pharmacy*   Lab Work:  Not needed   Testing/Procedures:  Not needed  Follow-Up: At [facility], you and your health needs are our priority.  As part of our continuing mission to provide you with exceptional heart care, we have created designated Provider Care Teams.  These Care Teams include your primary Cardiologist (physician) and Advanced Practice Providers (APPs -  Physician Assistants and Nurse Practitioners) who all work together to provide you with the care you need, when you need it.     Your next appointment:   12 month(s)  The format for your next appointment:   In Person  Provider:   Glenetta Hew, MD   Other Instructions

## 2020-05-15 ENCOUNTER — Encounter: Payer: Self-pay | Admitting: Cardiology

## 2020-05-15 ENCOUNTER — Other Ambulatory Visit: Payer: Self-pay | Admitting: Internal Medicine

## 2020-05-15 DIAGNOSIS — Z1231 Encounter for screening mammogram for malignant neoplasm of breast: Secondary | ICD-10-CM

## 2020-05-15 NOTE — Assessment & Plan Note (Signed)
Chronic HFpEF with improved EF.  She does have some minimal edema.   With well-controlled edema, I think she can probably back off on Lasix a little bit to 20 mg daily an additional 20 mg as needed.  We will continue the prescription as is originally written just in case she does need the additional doses more frequently.  She is on Toprol losartan at stable doses.

## 2020-05-15 NOTE — Assessment & Plan Note (Signed)
Notably less frequent PVCs on her monitor and no comment of PVCs during her hospitalization for her surgery recently.  Doing better on Toprol.

## 2020-05-15 NOTE — Assessment & Plan Note (Signed)
I cannot recall the last episode of A. fib she had.  She does not necessarily notice when she has recurrent A. fib, and rarely notes palpitations.  As such, she is stable [name], and only using beta-blocker for rate control.  Tolerating Eliquis well.  Okay to hold for procedures.

## 2020-05-15 NOTE — Assessment & Plan Note (Signed)
Most recent lipids as of October showed that her LDL was excellent at 49 on her 40 mg of a rosuvastatin with the extra dose.

## 2020-05-15 NOTE — Assessment & Plan Note (Signed)
Stable blood pressures on current dose of losartan Toprol.  No change.

## 2020-05-15 NOTE — Assessment & Plan Note (Signed)
This seems to have improved on on her feet most recent echo.  No active heart failure symptoms.  She does have some edema but without PND orthopnea this is probably due to her venous stasis.  Plan: Continue ARB and beta-blocker.  We were able to convert her to Toprol, and she is doing well.  We will reduce her back down to 20 tablets a day furosemide with an additional dose as needed.  (However continue the prescriptions written as to 20 mg tablets daily as directed)

## 2020-05-15 NOTE — Assessment & Plan Note (Signed)
BMI is just below the comorbid level but with her risk factors would be considered morbid.  We talked for the importance of her trying to increase her exercise level and adjust her diet to lose weight.

## 2020-05-15 NOTE — Assessment & Plan Note (Signed)
Based on the pulmonary block with no clear evidence of LV dysfunction.  Nonischemic cath.

## 2020-05-21 ENCOUNTER — Ambulatory Visit: Payer: Medicare Other | Admitting: Adult Health Nurse Practitioner

## 2020-05-21 ENCOUNTER — Other Ambulatory Visit: Payer: Self-pay

## 2020-05-21 ENCOUNTER — Encounter: Payer: Self-pay | Admitting: Adult Health Nurse Practitioner

## 2020-05-21 VITALS — BP 114/62 | HR 67 | Temp 97.6°F | Ht 65.0 in | Wt 236.0 lb

## 2020-05-21 DIAGNOSIS — Z0001 Encounter for general adult medical examination with abnormal findings: Secondary | ICD-10-CM | POA: Diagnosis not present

## 2020-05-21 DIAGNOSIS — M5136 Other intervertebral disc degeneration, lumbar region: Secondary | ICD-10-CM

## 2020-05-21 DIAGNOSIS — I5032 Chronic diastolic (congestive) heart failure: Secondary | ICD-10-CM

## 2020-05-21 DIAGNOSIS — D649 Anemia, unspecified: Secondary | ICD-10-CM

## 2020-05-21 DIAGNOSIS — M5416 Radiculopathy, lumbar region: Secondary | ICD-10-CM

## 2020-05-21 DIAGNOSIS — E782 Mixed hyperlipidemia: Secondary | ICD-10-CM

## 2020-05-21 DIAGNOSIS — F3341 Major depressive disorder, recurrent, in partial remission: Secondary | ICD-10-CM | POA: Diagnosis not present

## 2020-05-21 DIAGNOSIS — J3089 Other allergic rhinitis: Secondary | ICD-10-CM | POA: Diagnosis not present

## 2020-05-21 DIAGNOSIS — N1831 Chronic kidney disease, stage 3a: Secondary | ICD-10-CM

## 2020-05-21 DIAGNOSIS — M51369 Other intervertebral disc degeneration, lumbar region without mention of lumbar back pain or lower extremity pain: Secondary | ICD-10-CM

## 2020-05-21 DIAGNOSIS — R6889 Other general symptoms and signs: Secondary | ICD-10-CM | POA: Diagnosis not present

## 2020-05-21 DIAGNOSIS — E559 Vitamin D deficiency, unspecified: Secondary | ICD-10-CM

## 2020-05-21 DIAGNOSIS — Z0000 Encounter for general adult medical examination without abnormal findings: Secondary | ICD-10-CM

## 2020-05-21 DIAGNOSIS — F5101 Primary insomnia: Secondary | ICD-10-CM

## 2020-05-21 DIAGNOSIS — I10 Essential (primary) hypertension: Secondary | ICD-10-CM

## 2020-05-21 DIAGNOSIS — Z8719 Personal history of other diseases of the digestive system: Secondary | ICD-10-CM

## 2020-05-21 DIAGNOSIS — I480 Paroxysmal atrial fibrillation: Secondary | ICD-10-CM | POA: Diagnosis not present

## 2020-05-21 DIAGNOSIS — E039 Hypothyroidism, unspecified: Secondary | ICD-10-CM

## 2020-05-21 DIAGNOSIS — Z79899 Other long term (current) drug therapy: Secondary | ICD-10-CM

## 2020-05-21 DIAGNOSIS — K219 Gastro-esophageal reflux disease without esophagitis: Secondary | ICD-10-CM

## 2020-05-21 MED ORDER — TEMAZEPAM 30 MG PO CAPS: ORAL_CAPSULE | ORAL | 0 refills | Status: DC

## 2020-05-21 MED ORDER — KETOCONAZOLE 2 % EX SHAM: 1.0000 "application " | MEDICATED_SHAMPOO | CUTANEOUS | 0 refills | Status: DC

## 2020-05-21 NOTE — Progress Notes (Signed)
 ADDENDUM: Low hemoglobin, discussed returning for labs and stool cards x3 to recheck hemoglobin. Message sent and phone call to patient.    MEDICARE ANNUAL WELLNESS VISIT AND FOLLOW UP  Assessment:   Encounter for annual medicare wellness visit Yearly  Essential hypertension Continue current medications:Losartan2mmg daily, metoprolol  50mg  daily, Lasix  20mg  daily with potassium Monitor blood pressure at home; call if consistently over 130/80 Continue DASH diet.   Reminder to go to the ER if any CP, SOB, nausea, dizziness, severe HA, changes vision/speech, left arm numbness and tingling and jaw pain. -     CBC with Differential/Platelet -     CMP/GFR -     TSH  Paroxysmal atrial fibrillation (HCC): CHA2DS2-VASc Score - starting Eliquis  Control blood pressure, cholesterol, glucose, increase exercise.  Continue cardio follow up  Chronic combined systolic and diastolic heart failure, NYHA class 2 (HCC) -- exacerbated by A. Fib Weight stable, appears euvoluemic, followed by cardiology  Non-ischemic cardiomyopathy (HCC) Weight stable at home Continue to monitor  Paroxysmal supraventricular tachycardia (HCC) Control blood pressure, cholesterol, glucose, increase exercise.  Continue cardio follow up  Non-seasonal allergic rhinitis, unspecified trigger - Allegra OTC, increase H20, allergy hygiene explained.   ANXIETY DEPRESSION Continue medications: bupropion  XL 150 daily, cymbalta  30mg  BID Discussed stress management techniques  Discussed, increase water,intake & good sleep hygiene  Discussed increasing exercise & vegetables in diet   Gastroesophageal reflux disease without esophagitis Continue PPI/H2 blocker, diet discussed  CKD3a Increase fluids  Avoid NSAIDS Blood pressure control Monitor sugars  Will continue to monitor  Other specified hypothyroidism Taking levothyroxine  50 mcg daily Reminder to take on an empty stomach 30-21mins before first meal of the  day. No antacid medications for 4 hours. -     TSH  Lumbar radiculopathy Follow up ortho/pain management PRN  DDD (degenerative disc disease), lumbar Follow up ortho PRN  Hyperlipidemia -continue medications, check lipids, decrease fatty foods, increase activity.  -     Lipid panel  H/O left bundle branch block Continue cardio follow up  History of pancreatitis Monitor, no ETOH  Hx of  -  Malignant neoplasm of lower-inner quadrant of left breast in female, estrogen receptor positive  S/p lumpectomy and 5 year anastrozole , has been released Monitoring by annual mammograms   Morbid Obesity with co morbidities (HCC)  - long discussion about weight loss, diet, and exercise   Prediabetes Discussed general issues about diabetes pathophysiology and management., Educational material distributed., Suggested low cholesterol diet., Encouraged aerobic exercise., Discussed foot care., Reminded to get yearly retinal exam. -     Hemoglobin A1c  Vitamin D  deficiency At goal at recent check; continue to recommend supplementation for goal of 60-100 Defer vitamin D  level  History of ulcer disease Monitor, avoid NSAIDS, continue GERD meds   Anemia Chronic, stable, monitor; recent iron was normal, stop supplement, get on B12 sublingual which was low. UTD on colonoscopy.    Over 40 minutes of face to face interview, exam, counseling, chart review and critical decision making was performed Future Appointments  Date Time Provider Department Center  07/01/2020  1:50 PM [facility]  01/21/2021 10:00 AM Jeanine Knee, NP [facility] None  05/24/2021  2:30 PM Remigio Mcmillon, NP [facility] None     Plan:   During the course of the visit the patient was educated and counseled about appropriate screening and preventive services including:    Pneumococcal vaccine   Prevnar 13  Influenza vaccine  Td vaccine  Screening electrocardiogram  Bone densitometry  screening  Colorectal cancer screening  Diabetes screening  Glaucoma screening  Nutrition counseling   Advanced directives: requested   Subjective:  [name] [name] is a 75 y.o. female who presents for Medicare Annual Wellness Visit and 3 month follow up.   She had an injection to her right knee three days ago.  Reports it is slowly improving.  She had covid 19 with resp failure requiring admission in 12/24-12/30/20, with significant slow recovering fatigue, significantly improved.   Continues to work in [employer]. Overall doing well.  She has hx of R breast cancer in 2015, s/p lumpectomy, completed 5 years of anastrozole  and was released by Dr. Layla, now following with annual mammograms.   She follows with Dr. Edith (pain management) for cervical and lower back pain with radiculopathy and gets epidural injections though reports hasn't been recently. She also has knee pain seen by Dr. Yvone. Has discussed spine surgery but would prefer to avoid.   she has a diagnosis of depression/anxiety and is currently on wellbutrin  150 mg, cymbalta  60 mg, reports symptoms are typically well controlled with this regimen, more down in recent weeks due to several family deaths in recent months. she also takes temazepam  PRN insomnia daily, hasn't done well with attempts to taper.   she has a diagnosis of GERD with history of ulcer, which is currently managed by omeprazole  40 mg and famotidine  PRN breakthrough.  she reports symptoms is currently well controlled, and denies breakthrough reflux, burning in chest, hoarseness or cough.    BMI is Body mass index is 39.27 kg/m., she has not been working on diet and exercise, works all day on her feet with walking and lifting at [employer].  Wt Readings from Last 3 Encounters:  05/21/20 236 lb (107 kg)  05/05/20 232 lb (105.2 kg)  03/17/20 225 lb (102.1 kg)   She has hx of a. Fib/PSVT, on elequis, non-ischemic cardiomyopathy, diastolic CHF improved  on ARB/BB, most recent ECHO with EF 60-65%, followed by cardiology Dr. Anner.  Had recent benign holter results in Feb 2021.   Her blood pressure has been controlled at home, today their BP is BP: 114/62 She does not workout. She denies chest pain, shortness of breath, dizziness.   She is on cholesterol medication (rosuvastatin  40 mg daily) and denies myalgias. Her cholesterol is at goal. The cholesterol last visit was:   Lab Results  Component Value Date   CHOL 123 01/22/2020   HDL 55 01/22/2020   LDLCALC 49 01/22/2020   TRIG 102 01/22/2020   CHOLHDL 2.2 01/22/2020    She has not been working on diet and exercise for prediabetes, and denies foot ulcerations, increased appetite, nausea, paresthesia of the feet, polydipsia, polyuria, visual disturbances, vomiting and weight loss. Last A1C in the office was:  Lab Results  Component Value Date   HGBA1C 6.3 (H) 09/10/2019   She has CKD III monitored at this office. Last GFR:  Lab Results  Component Value Date   GFRNONAA 43 (L) 01/22/2020   Patient is on Vitamin D  supplement.   Lab Results  Component Value Date   VD25OH 59 01/17/2019       Medication Review: Current Outpatient Medications on File Prior to Visit  Medication Sig Dispense Refill  . acetaminophen  (TYLENOL ) 500 MG tablet Take 1,000 mg by mouth every 6 (six) hours as needed for moderate pain.    . apixaban  (ELIQUIS ) 5 MG TABS tablet Take 1 tablet (5 mg  total) by mouth 2 (two) times daily. 180 tablet 3  . buPROPion  (WELLBUTRIN  XL) 150 MG 24 hr tablet TAKE 1 TABLET BY MOUTH EVERY DAY IN THE MORNING 90 tablet 1  . Cholecalciferol  (VITAMIN D ) 2000 UNITS tablet Take 4,000 Units by mouth daily.    . Coenzyme Q10 (COQ10) 100 MG CAPS Take 300 mg by mouth daily. 90 each 11  . COLACE 100 MG capsule Take 100 mg by mouth 2 (two) times daily as needed.    . DULoxetine  (CYMBALTA ) 30 MG capsule Take 30 mg by mouth 2 (two) times daily.    . furosemide  (LASIX ) 20 MG tablet Take 1  tablet (20 mg total) by mouth 2 (two) times daily. 180 tablet 3  . gabapentin  (NEURONTIN ) 300 MG capsule TAKE 1 CAPSULE TWICE DAILY FOR CHRONIC PAIN 180 capsule 1  . HYDROcodone -acetaminophen  (NORCO/VICODIN) 5-325 MG tablet Take by mouth.    . ketoconazole  (NIZORAL ) 2 % shampoo Apply 1 application topically 2 (two) times a week. 120 mL 0  . levothyroxine  (SYNTHROID ) 50 MCG tablet TAKE 1/2-1 TABLET DAILY ON EMPTY STOMACH W/ WATER FOR 30 MINS NO ANTACID/MEDS/CALCIUM /MAG. X 4 HOURS 90 tablet 3  . losartan  (COZAAR ) 100 MG tablet Take 1 tablet (100 mg total) by mouth daily. 90 tablet 3  . meclizine  (ANTIVERT ) 25 MG tablet 1/2-1 pill up to 3 times daily for motion sickness/dizziness. Caution, may cause drowsiness. 60 tablet 0  . metoprolol  succinate (TOPROL -XL) 50 MG 24 hr tablet TAKE 1 TABLET BY MOUTH EVERY DAY WITH OR IMMEDIATELY FOLLOWING A MEAL 90 tablet 3  . mometasone  (NASONEX ) 50 MCG/ACT nasal spray Use 1-2 sprays  to each nostril 1 to 2 x /day 51 g 3  . neomycin -polymyxin-hydrocortisone  (CORTISPORIN) OTIC solution Apply 1-2 drops to toe BID after soaking 10 mL 1  . nystatin  ointment (MYCOSTATIN ) Apply 1 application topically 2 (two) times daily. 30 g 0  . omeprazole  (PRILOSEC) 40 MG capsule TAKE 1 CAPSULE TWICE DAILY FOR ACID INDIGESTION & REFLUX. 180 capsule 3  . potassium chloride  SA (KLOR-CON  M20) 20 MEQ tablet TAKE ONE TABLET DAILY OR AS DIRECTED BY YOUR PROVIDER. 90 tablet 1  . rosuvastatin  (CRESTOR ) 40 MG tablet TAKE 1 TABLET BY MOUTH EVERY DAY, TAKE 2 TABLETS BY MOUTH ON MONDAYS 102 tablet 2  . temazepam  (RESTORIL ) 30 MG capsule TAKE 1 CAPSULE PRIOR TO BEDTIME AS NEEDED FOR SLEEP. AVOID TAKING DAILY DUE TO ADDICTIVE POTENTIAL. 90 capsule 0  . traMADol  (ULTRAM ) 50 MG tablet Take 50 mg by mouth every 6 (six) hours as needed.    . triamcinolone  ointment (KENALOG ) 0.1 % Apply 1 application topically 2 (two) times daily. 80 g 1  . dicyclomine  (BENTYL ) 10 MG capsule Take 1 capsule (10 mg total)  by mouth 4 (four) times daily for 14 days. 56 capsule 0  . ipratropium (ATROVENT ) 0.03 % nasal spray Place 2 sprays into the nose 3 (three) times daily. 54 mL 3   No current facility-administered medications on file prior to visit.    Allergies  Allergen Reactions  . Requip [Ropinirole Hcl] Shortness Of Breath and Nausea And Vomiting  . Minocycline Hives  . Tetracyclines & Related Hives  . Zestril [Lisinopril] Cough  . Zetia [Ezetimibe] Other (See Comments)    MYALGIA JOINT PAIN  . Codeine Other (See Comments)    fatigue  . Morphine Sulfate Other (See Comments)    Palpitations  . Sulfa Antibiotics Other (See Comments)    Headache (pt states that a  blood pressure medicine caused a severe headache but doesn't remember a reaction to sulfa)    Current Problems (verified) Patient Active Problem List   Diagnosis Date Noted  . History of breast cancer 09/06/2019  . 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 Bradycardia   . History of COVID-19   . CKD (chronic kidney disease) stage 3, GFR 30-59 ml/min (HCC) 09/21/2018  . Morbid obesity (HCC) 06/14/2017  . Primary osteoarthritis of left knee 09/23/2016  . Non-ischemic cardiomyopathy (HCC)   . (HFpEF) heart failure with preserved ejection fraction (HCC) 08/08/2015  . Paroxysmal atrial fibrillation (HCC): CHA2DS2-VASc Score  4 ->  Eliquis  08/06/2015  . Hypothyroidism 08/06/2015  . Absolute anemia   . Encounter for Medicare annual wellness exam 03/31/2015  . DDD (degenerative disc disease), lumbar 06/16/2014  . Other abnormal glucose (prediabetes) 12/18/2013  . Vitamin D  deficiency 12/18/2013  . Medication management 12/18/2013  . Paroxysmal supraventricular tachycardia (HCC) 10/18/2013  . Radiculopathy 10/16/2013  . Hyperlipidemia 03/13/2008  . Depression, major, recurrent, in partial remission (HCC) 03/13/2008  . Essential hypertension 03/13/2008  . H/O left bundle branch block 03/13/2008  . Allergic rhinitis 03/13/2008  . GERD  03/13/2008    Screening Tests Health Maintenance  Topic Date Due  . COVID-19 Vaccine (4 - Booster for Pfizer series) 07/31/2020  . MAMMOGRAM  06/18/2021  . COLONOSCOPY (Pts 45-77yrs Insurance coverage will need to be confirmed)  03/20/2023  . TETANUS/TDAP  09/23/2028  . INFLUENZA VACCINE  Completed  . DEXA SCAN  Completed  . Hepatitis C Screening  Completed  . PNA vac Low Risk Adult  Completed    Immunization History  Administered Date(s) Administered  . Fluad Quad(high Dose 65+) 01/15/2020  . Influenza Whole 01/01/2013  . Influenza, High Dose Seasonal PF 01/29/2014, 12/30/2014, 01/17/2019  . Influenza,inj,Quad PF,6+ Mos 01/18/2018  . Influenza-Unspecified 12/11/2015, 02/04/2017, 01/18/2018  . PFIZER(Purple Top)SARS-COV-2 Vaccination 05/18/2019, 06/10/2019  . Pneumococcal Conjugate-13 06/16/2014  . Pneumococcal Polysaccharide-23 05/23/2011  . Td 09/24/2018  . Tdap 03/11/2008  . Zoster 02/02/2010   Preventative care: Last colonoscopy: 2014 Last mammogram: 06/2019 (05/06/2013 + right breast cancer s/p lumpectomy) Last pap smear/pelvic exam: remote   DEXA: 05/2018 - L forearm T -1.4, otherwise in normal range Renal US  08/2015 Echo 2020 Cath 2017  Prior vaccinations: TD or Tdap: 2020 Influenza: 2020 Pneumococcal: 2013 Prevnar 13: 2016 Shingles/Zostavax: 2011, insurance won't cover at this time, declines shingrix  Covid 19: 2/2, 2021, pfizer  Names of Other Physician/Practitioners you currently use: 1. [facility] here for primary care 2. Dr Glendia, eye doctor, last visit 2021 3. [facility], dentist, last visit 2021, q 6 months  Patient Care Team: Tonita Fallow, MD as PCP - General (Internal Medicine) Anner Alm ORN, MD as PCP - Cardiology (Cardiology) Magrinat, Sandria BROCKS, MD as Consulting Physician (Oncology) Anner Alm ORN, MD as Consulting Physician (Cardiology) Yvone Rush, MD as Consulting Physician  (Orthopedic Surgery) Ebbie Cough, MD as Consulting Physician (General Surgery) Izell Domino, MD as Attending Physician (Radiation Oncology)  SURGICAL HISTORY She  has a past surgical history that includes Cataract extraction bilateral w/ anterior vitrectomy (Bilateral); Abdominal hysterectomy; Cholecystectomy; Knee arthroscopy (Bilateral); Tonsillectomy; Colonoscopy (2014); Upper gi endoscopy; Dupuytren / palmar fasciotomy (Bilateral, 2007); Tubal ligation; epidural injections; Lumbar fusion (10/2013); Cardiac catheterization (N/A, 08/10/2015); transthoracic echocardiogram (08/07/2015); transthoracic echocardiogram (10/2015); Joint replacement; Total knee arthroplasty (Left, 09/23/2016); Breast lumpectomy (Left); Foot Tendon Surgery (Right, ~ 11/2015); Total knee arthroplasty (Left, 09/23/2016); and transthoracic echocardiogram (04/05/2019).  FAMILY HISTORY Her family history includes COPD in her father; Heart attack in her mother; Hyperlipidemia in her sister; Hypertension in her father; Stroke in her mother. SOCIAL HISTORY She  reports that she has never smoked. She has never used smokeless tobacco. She reports current alcohol use. She reports that she does not use drugs.   MEDICARE WELLNESS OBJECTIVES: Physical activity: Current Exercise Habits: Home exercise routine, Type of exercise: walking, Time (Minutes): 15, Frequency (Times/Week): 2, Weekly Exercise (Minutes/Week): 30, Intensity: Mild, Exercise limited by: orthopedic condition(s) Cardiac risk factors: Cardiac Risk Factors include: advanced age (>54men, >8 women);hypertension;dyslipidemia;sedentary lifestyle Depression/mood screen:   Depression screen [facility] 2/9 05/21/2020  Decreased Interest 0  Down, Depressed, Hopeless 0  PHQ - 2 Score 0  Altered sleeping -  Tired, decreased energy -  Change in appetite -  Feeling bad or failure about yourself  -  Trouble concentrating -  Moving slowly or fidgety/restless -  Suicidal thoughts -   PHQ-9 Score -  Difficult doing work/chores -  Some recent data might be hidden    ADLs:  In your present state of health, do you have any difficulty performing the following activities: 05/21/2020 09/10/2019  Hearing? N N  Vision? N N  Difficulty concentrating or making decisions? N N  Walking or climbing stairs? N N  Dressing or bathing? N N  Doing errands, shopping? N N  Preparing Food and eating ? N -  Using the Toilet? N -  In the past six months, have you accidently leaked urine? N -  Do you have problems with loss of bowel control? N -  Managing your Medications? N -  Managing your Finances? N -  Housekeeping or managing your Housekeeping? N -  Some recent data might be hidden     Cognitive Testing  Alert? Yes  Normal Appearance?Yes  Oriented to person? Yes  Place? Yes   Time? Yes  Recall of three objects?  Yes  Can perform simple calculations? Yes  Displays appropriate judgment?Yes  Can read the correct time from a watch face?Yes  EOL planning: Does Patient Have a Medical Advance Directive?: No Would patient like information on creating a medical advance directive?: Yes (MAU/Ambulatory/Procedural Areas - Information given)  Review of Systems  Constitutional: Negative for malaise/fatigue and weight loss.  HENT: Negative for hearing loss and tinnitus.   Eyes: Negative for blurred vision and double vision.  Respiratory: Negative for cough, sputum production, shortness of breath and wheezing.   Cardiovascular: Negative for chest pain, palpitations, orthopnea, claudication, leg swelling and PND.  Gastrointestinal: Negative for abdominal pain, blood in stool, constipation, diarrhea, heartburn, melena, nausea and vomiting.  Genitourinary: Negative.   Musculoskeletal: Negative for falls, joint pain and myalgias.  Skin: Negative for rash.  Neurological: Negative for dizziness, tingling, sensory change, weakness and headaches.  Endo/Heme/Allergies: Negative for polydipsia.   Psychiatric/Behavioral: Negative.  Negative for depression, memory loss, substance abuse and suicidal ideas. The patient is not nervous/anxious and does not have insomnia.   All other systems reviewed and are negative.    Objective:     Today's Vitals   05/21/20 1427  BP: 114/62  Pulse: 67  Temp: 97.6 F (36.4 C)  SpO2: 99%  Weight: 236 lb (107 kg)  Height: 5' 5 (1.651 m)  PainSc: 0-No pain   Body mass index is 39.27 kg/m.  General appearance: alert, no distress, WD/WN, female HEENT: normocephalic, sclerae anicteric, TMs pearly, nares patent, no discharge or erythema, pharynx normal Oral cavity: MMM,  no lesions Neck: supple, no lymphadenopathy, no thyromegaly, no masses Heart: RRR, normal S1, S2, no murmurs  Lungs: CTA bilaterally, no wheezes, rhonchi, or rales Abdomen: +bs, soft, non tender, non distended, no masses, no hepatomegaly, no splenomegaly Musculoskeletal: nontender, no swelling, no obvious deformity Extremities: no edema, no cyanosis, no clubbing Pulses: 2+ symmetric, upper and lower extremities, normal cap refill Neurological: alert, oriented x 3, CN2-12 intact, strength normal upper extremities and lower extremities, sensation normal throughout, DTRs 2+ throughout, no cerebellar signs, gait normal Psychiatric: normal affect, behavior normal, pleasant   Medicare Attestation I have personally reviewed: The patient's medical and social history Their use of alcohol, tobacco or illicit drugs Their current medications and supplements The patient's functional ability including ADLs,fall risks, home safety risks, cognitive, and hearing and visual impairment Diet and physical activities Evidence for depression or mood disorders  The patient's weight, height, BMI, and visual acuity have been recorded in the chart.  I have made referrals, counseling, and provided education to the patient based on review of the above and I have provided the patient with a written  personalized care plan for preventive services.     Jakaila Norment, NP   05/21/2020

## 2020-05-22 LAB — CBC WITH DIFFERENTIAL/PLATELET
Absolute Monocytes: 844 cells/uL (ref 200–950)
Basophils Absolute: 67 cells/uL (ref 0–200)
Basophils Relative: 0.5 %
Eosinophils Absolute: 13 cells/uL — ABNORMAL LOW (ref 15–500)
Eosinophils Relative: 0.1 %
HCT: 25.6 % — ABNORMAL LOW (ref 35.0–45.0)
Hemoglobin: 7.8 g/dL — ABNORMAL LOW (ref 11.7–15.5)
Lymphs Abs: 1769 cells/uL (ref 850–3900)
MCH: 24.5 pg — ABNORMAL LOW (ref 27.0–33.0)
MCHC: 30.5 g/dL — ABNORMAL LOW (ref 32.0–36.0)
MCV: 80.3 fL (ref 80.0–100.0)
MPV: 10.8 fL (ref 7.5–12.5)
Monocytes Relative: 6.3 %
Neutro Abs: 10707 cells/uL — ABNORMAL HIGH (ref 1500–7800)
Neutrophils Relative %: 79.9 %
Platelets: 370 10*3/uL (ref 140–400)
RBC: 3.19 10*6/uL — ABNORMAL LOW (ref 3.80–5.10)
RDW: 14.5 % (ref 11.0–15.0)
Total Lymphocyte: 13.2 %
WBC: 13.4 10*3/uL — ABNORMAL HIGH (ref 3.8–10.8)

## 2020-05-22 LAB — COMPLETE METABOLIC PANEL WITH GFR
AG Ratio: 1.5 (calc) (ref 1.0–2.5)
ALT: 10 U/L (ref 6–29)
AST: 13 U/L (ref 10–35)
Albumin: 4 g/dL (ref 3.6–5.1)
Alkaline phosphatase (APISO): 83 U/L (ref 37–153)
BUN/Creatinine Ratio: 23 (calc) — ABNORMAL HIGH (ref 6–22)
BUN: 26 mg/dL — ABNORMAL HIGH (ref 7–25)
CO2: 27 mmol/L (ref 20–32)
Calcium: 9.4 mg/dL (ref 8.6–10.4)
Chloride: 102 mmol/L (ref 98–110)
Creat: 1.12 mg/dL — ABNORMAL HIGH (ref 0.60–0.93)
GFR, Est African American: 56 mL/min/{1.73_m2} — ABNORMAL LOW (ref >=60)
GFR, Est Non African American: 48 mL/min/{1.73_m2} — ABNORMAL LOW (ref >=60)
Globulin: 2.6 g/dL (calc) (ref 1.9–3.7)
Glucose, Bld: 99 mg/dL (ref 65–99)
Potassium: 3.7 mmol/L (ref 3.5–5.3)
Sodium: 139 mmol/L (ref 135–146)
Total Bilirubin: 0.2 mg/dL (ref 0.2–1.2)
Total Protein: 6.6 g/dL (ref 6.1–8.1)

## 2020-05-22 LAB — IRON, TOTAL/TOTAL IRON BINDING CAP
%SAT: 4 % (calc) — ABNORMAL LOW (ref 16–45)
Iron: 21 ug/dL — ABNORMAL LOW (ref 45–160)
TIBC: 507 mcg/dL (calc) — ABNORMAL HIGH (ref 250–450)

## 2020-05-22 LAB — LIPID PANEL
Cholesterol: 132 mg/dL (ref <200)
HDL: 61 mg/dL (ref >=50)
LDL Cholesterol (Calc): 51 mg/dL (calc)
Non-HDL Cholesterol (Calc): 71 mg/dL (calc) (ref <130)
Total CHOL/HDL Ratio: 2.2 (calc) (ref <5.0)
Triglycerides: 114 mg/dL (ref <150)

## 2020-05-22 LAB — TSH: TSH: 0.73 mIU/L (ref 0.40–4.50)

## 2020-05-25 ENCOUNTER — Other Ambulatory Visit: Payer: Self-pay | Admitting: Adult Health Nurse Practitioner

## 2020-05-25 DIAGNOSIS — D649 Anemia, unspecified: Secondary | ICD-10-CM

## 2020-06-09 ENCOUNTER — Other Ambulatory Visit: Payer: Self-pay

## 2020-06-09 DIAGNOSIS — D649 Anemia, unspecified: Secondary | ICD-10-CM

## 2020-06-09 LAB — POC HEMOCCULT BLD/STL (HOME/3-CARD/SCREEN)
Card #2 Fecal Occult Blod, POC: NEGATIVE
Card #3 Fecal Occult Blood, POC: NEGATIVE
Fecal Occult Blood, POC: NEGATIVE

## 2020-06-10 DIAGNOSIS — Z1212 Encounter for screening for malignant neoplasm of rectum: Secondary | ICD-10-CM

## 2020-06-10 DIAGNOSIS — Z1211 Encounter for screening for malignant neoplasm of colon: Secondary | ICD-10-CM | POA: Diagnosis not present

## 2020-06-15 ENCOUNTER — Other Ambulatory Visit: Payer: Self-pay | Admitting: Adult Health Nurse Practitioner

## 2020-07-01 ENCOUNTER — Ambulatory Visit
Admission: RE | Admit: 2020-07-01 | Discharge: 2020-07-01 | Disposition: A | Discharge status: Home / Self Care | Payer: Medicare Other | Source: Ambulatory Visit | Attending: Internal Medicine | Admitting: Internal Medicine

## 2020-07-01 ENCOUNTER — Other Ambulatory Visit: Payer: Self-pay

## 2020-07-01 DIAGNOSIS — Z1231 Encounter for screening mammogram for malignant neoplasm of breast: Secondary | ICD-10-CM

## 2020-07-06 ENCOUNTER — Other Ambulatory Visit: Payer: Self-pay | Admitting: Internal Medicine

## 2020-07-06 DIAGNOSIS — R928 Other abnormal and inconclusive findings on diagnostic imaging of breast: Secondary | ICD-10-CM

## 2020-07-12 ENCOUNTER — Other Ambulatory Visit: Payer: Self-pay | Admitting: Adult Health

## 2020-07-27 ENCOUNTER — Ambulatory Visit
Admission: RE | Admit: 2020-07-27 | Discharge: 2020-07-27 | Disposition: A | Discharge status: Home / Self Care | Payer: Medicare Other | Source: Ambulatory Visit | Attending: Internal Medicine | Admitting: Internal Medicine

## 2020-07-27 ENCOUNTER — Other Ambulatory Visit: Payer: Self-pay

## 2020-07-27 ENCOUNTER — Ambulatory Visit: Payer: Medicare Other

## 2020-07-27 DIAGNOSIS — R928 Other abnormal and inconclusive findings on diagnostic imaging of breast: Secondary | ICD-10-CM

## 2020-07-29 ENCOUNTER — Other Ambulatory Visit: Payer: Self-pay | Admitting: Orthopedic Surgery

## 2020-08-23 ENCOUNTER — Other Ambulatory Visit: Payer: Self-pay | Admitting: Adult Health Nurse Practitioner

## 2020-08-23 ENCOUNTER — Other Ambulatory Visit: Payer: Self-pay | Admitting: Internal Medicine

## 2020-08-23 ENCOUNTER — Other Ambulatory Visit: Payer: Self-pay | Admitting: Adult Health

## 2020-08-23 DIAGNOSIS — F5101 Primary insomnia: Secondary | ICD-10-CM

## 2020-08-24 ENCOUNTER — Other Ambulatory Visit: Payer: Self-pay

## 2020-08-24 MED ORDER — OMEPRAZOLE 40 MG PO CPDR: DELAYED_RELEASE_CAPSULE | ORAL | 3 refills | Status: DC

## 2020-08-24 MED ORDER — LEVOTHYROXINE SODIUM 50 MCG PO TABS: ORAL_TABLET | ORAL | 3 refills | Status: DC

## 2020-09-02 ENCOUNTER — Ambulatory Visit: Payer: Medicare Other | Admitting: Internal Medicine

## 2020-09-02 ENCOUNTER — Other Ambulatory Visit: Payer: Self-pay

## 2020-09-02 ENCOUNTER — Encounter: Payer: Self-pay | Admitting: Internal Medicine

## 2020-09-02 VITALS — BP 128/78 | HR 73 | Temp 97.7°F | Resp 16 | Ht 65.0 in | Wt 236.4 lb

## 2020-09-02 DIAGNOSIS — F3341 Major depressive disorder, recurrent, in partial remission: Secondary | ICD-10-CM

## 2020-09-02 DIAGNOSIS — N1831 Chronic kidney disease, stage 3a: Secondary | ICD-10-CM

## 2020-09-02 DIAGNOSIS — D649 Anemia, unspecified: Secondary | ICD-10-CM | POA: Diagnosis not present

## 2020-09-02 DIAGNOSIS — E782 Mixed hyperlipidemia: Secondary | ICD-10-CM | POA: Diagnosis not present

## 2020-09-02 DIAGNOSIS — E559 Vitamin D deficiency, unspecified: Secondary | ICD-10-CM

## 2020-09-02 DIAGNOSIS — R7309 Other abnormal glucose: Secondary | ICD-10-CM | POA: Diagnosis not present

## 2020-09-02 DIAGNOSIS — I10 Essential (primary) hypertension: Secondary | ICD-10-CM

## 2020-09-02 DIAGNOSIS — I5042 Chronic combined systolic (congestive) and diastolic (congestive) heart failure: Secondary | ICD-10-CM

## 2020-09-02 DIAGNOSIS — Z79899 Other long term (current) drug therapy: Secondary | ICD-10-CM

## 2020-09-02 DIAGNOSIS — E039 Hypothyroidism, unspecified: Secondary | ICD-10-CM

## 2020-09-02 DIAGNOSIS — R300 Dysuria: Secondary | ICD-10-CM

## 2020-09-02 NOTE — Progress Notes (Addendum)
Future Appointments  Date Time Provider [place]  09/02/2020  2:30 PM Unk Pinto, MD [facility] None  01/21/2021 10:00 AM Liane Comber, NP [facility] None  05/24/2021  2:30 PM Garnet Sierras, NP [facility] None    History of Present Illness:      This very nice 75 y.o.  MWF presents for 3 month follow up with HTN, HLD, Pre-Diabetes and Vitamin D Deficiency.  Patient tentatively is scheduled for neck surgery by Dr Lynann Bologna on 09/24/2020. Patient also has hx/o a major Depressive Disorder seemingly adequately compensated on Wellbutrin and she's also on Cymbalta for Depression and chronic pain Syndrome.       Over the last year patient has been followed for anemia with last Hgb 7.8 gm% . Stool hemoccults were Negative and iron level = "7"  was very low in Dec 2021 and = "4"  In Feb 2022 .  Iron levels may be low due to poor absorption from concomitantly taking a PPI (Omeprazole).    Patient has been followed in the past by Dr Jana Hakim for Cancer of the left breast.       Patient is treated for HTN (1988) & BP has been controlled at home. Today's BP is at goal - 128/78.  In Apr 2017, she was discovered in [facility] 4) and started [name].  Initial heart cath had a low EF 25-330% and subsequent echo in July found EF improved at 45-50%. 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 Patient has had no complaints of any cardiac type chest pain, palpitations, dyspnea / orthopnea / PND, dizziness, claudication, or dependent edema.       Hyperlipidemia is controlled with diet & meds. Patient denies myalgias or other med SE's. Last Lipids were at goal:  Lab Results  Component Value Date   CHOL 132 05/21/2020   HDL 61 05/21/2020   LDLCALC 51 05/21/2020   TRIG 114 05/21/2020   CHOLHDL 2.2 05/21/2020     Also, the patient has Morbid Obesity  (BMI 39.34+) and consequent PreDiabetes(A1c 6.3% /2017) and has had  no symptoms of reactive hypoglycemia, diabetic polys, paresthesias or visual blurring.  Last A1c was still not at goal:  Lab Results  Component Value Date   HGBA1C 6.3 (H) 09/10/2019        In 2016, she was discovered Hypothyroid  and initiated on replacement therapy.        Further, the patient also has history of Vitamin D Deficiency ("13" /2016) and supplements vitamin D without any suspected side-effects. Last vitamin D was at goal:   Lab Results  Component Value Date   VD25OH 59 01/17/2019     Current Outpatient Medications on File Prior to Visit  Medication Sig  . acetaminophen (TYLENOL) 500 MG tablet Take 1,000 mg by mouth every 6 (six) hours as needed for moderate pain.  Marland Kitchen apixaban (ELIQUIS) 5 MG TABS tablet Take 1 tablet (5 mg total) by mouth 2 (two) times daily.  Marland Kitchen buPROPion (WELLBUTRIN XL) 150 MG 24 hr tablet TAKE 1 TABLET BY MOUTH EVERY DAY IN THE MORNING  . Cholecalciferol (VITAMIN D) 2000 UNITS tablet Take 4,000 Units by mouth daily.  . Coenzyme Q10 (COQ10) 100 MG CAPS Take 300 mg by mouth daily.  Marland Kitchen COLACE 100 MG capsule Take 100 mg by mouth 2 (two) times daily as needed.  . dicyclomine (BENTYL) 10 MG capsule Take 1 capsule (10 mg total) by mouth  4 (four) times daily for 14 days.  . DULoxetine (CYMBALTA) 30 MG capsule Take 30 mg by mouth 2 (two) times daily.  . furosemide (LASIX) 20 MG tablet Take 1 tablet (20 mg total) by mouth 2 (two) times daily.  Marland Kitchen gabapentin (NEURONTIN) 300 MG capsule TAKE 1 CAPSULE BY MOUTH TWICE A DAY FOR CHRONIC PAIN  . HYDROcodone-acetaminophen (NORCO/VICODIN) 5-325 MG tablet Take by mouth.  Marland Kitchen ipratropium (ATROVENT) 0.03 % nasal spray Place 2 sprays into the nose 3 (three) times daily.  Marland Kitchen ketoconazole (NIZORAL) 2 % shampoo USE AS DIRECTED ON AFFECTED AREA TWICE A WEEK  . levothyroxine  50 MCG tablet TAKE 1/2-1 TABLET DAILY   . losartan  100 MG tablet Take 1 tablet  daily.  . meclizine  25 MG tablet 1/2-1 pill up to 3 times daily for motion  sickness/dizziness.  . metoprolol succinate -XL)50 MG 24 hr tablet TAKE 1 TABLET EVERY DAY WITH A MEAL  . NASONEX nasal spray Use 1-2 sprays  to each nostril 1 to 2 x /day  . neomycin-polymyxin-hydrocortisone OTIC solution Apply 1-2 drops to toe BID after soaking  . nystatin ointment  Apply  2 () times daily.  Marland Kitchen omeprazole  40 MG capsule Take one capsule twice daily  . potassium chloride  20 MEQ tablet TAKE ONE TABLET DAILY OR AS DIRECTED BY YOUR PROVIDER.  Marland Kitchen rosuvastatin 40 MG tablet TAKE 1 TABLET EVERY DAY, TAKE 2 TABLETS  ON MONDAYS  . temazepam  30 MG capsule TAKE 1 CAPSULE  DAILY AT BEDTIME   . traMADol  50 MG tablet Take 50 mg by mouth every 6 (six) hours as needed.  . triamcinolone ointment  0.1 % Apply 1 application topically 2 (two) times daily.   No current facility-administered medications on file prior to visit.     Allergies  Allergen Reactions  . Requip [Ropinirole Hcl] Shortness Of Breath and Nausea And Vomiting  . Minocycline Hives  . Tetracyclines & Related Hives  . Zestril [Lisinopril] Cough  . Zetia [Ezetimibe] Other (See Comments)    MYALGIA JOINT PAIN  . Codeine Other (See Comments)    fatigue  . Morphine Sulfate Other (See Comments)    Palpitations  . Sulfa Antibiotics Other (See Comments)    Headache (pt states that a blood pressure medicine caused a severe headache but doesn't remember a reaction to sulfa)     PMHx:   Past Medical History:  Diagnosis Date  . Anemia   . Anxiety   . Arthritis   . 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 Chronic back pain    cyst sitting on L4-5;slipped disc  . Depression    takes Cymbalta daily  . Diverticulosis   . Dysrhythmia   . GERD (gastroesophageal reflux disease)    takes Omeprazole daily  . Headache(784.0)    rare  . History of gastric ulcer at age 32  . History of pancreatitis 03/27/2008  . History of ulcer disease 08/06/2015  . Hyperlipidemia   .  Hypertension    takes Cardura nightly and Verapamil daily  . Hypothyroidism   . Insomnia    takes Restoril nightly  . Malignant neoplasm of lower-inner quadrant of left breast in female, estrogen receptor positive ([place]) 05/20/2013  . Nonischemic cardiomyopathy ([place]) 07/2015   Normal coronary arteries by cath. EF was 25-30% by echo. GR2 DD - likely related to LBBB in setting of A. fib  .  Obesity (BMI 30-39.9) 06/11/2013  . Paroxysmal atrial fibrillation ([place]): CHA2DS2-VASc Score - starting Eliquis 08/06/2015   This patients CHA2DS2-VASc Score and unadjusted Ischemic Stroke Rate (% per year) is equal to 4.8 % stroke rate/year from a score of 4  Above score calculated as 1 point each if present [CHF, HTN, DM, Vascular=MI/PAD/Aortic Plaque, Age if 65-74, or Female]; 2 points each if present [Age > 75, or Stroke/TIA/TE]  . Pneumonia due to COVID-19 virus 04/05/2019  . Pneumonia due to COVID-19 virus 04/04/2019   Admitted December 24-30, 2020-acute hypoxic respite failure.  Marland Kitchen PONV (postoperative nausea and vomiting)   . S/P radiation therapy 07/18/2013-08/28/2013   1) Left Breast / 50 Gy in 25 fractions/ 2) Left Breast Boost / 10 Gy in 5 fractions  . Shingles      Immunization History  Administered Date(s) Administered  . Fluad Quad(high Dose 65+) 01/15/2020  . Influenza Whole 01/01/2013  . Influenza, High Dose Seasonal PF 01/29/2014, 12/30/2014, 01/17/2019  . Influenza,inj,Quad PF,6+ Mos 01/18/2018  . Influenza-Unspecified 12/11/2015, 02/04/2017, 01/18/2018  . PFIZER(Purple Top)SARS-COV-2 Vaccination 05/18/2019, 06/10/2019  . Pneumococcal Conjugate-13 06/16/2014  . Pneumococcal Polysaccharide-23 05/23/2011  . Td 09/24/2018  . Tdap 03/11/2008  . Zoster 02/02/2010     Past Surgical History:  Procedure Laterality Date  . ABDOMINAL HYSTERECTOMY    . BREAST LUMPECTOMY Left    neg  . CARDIAC CATHETERIZATION N/A 08/10/2015   Procedure: Right/Left Heart Cath and Coronary Angiography;  Surgeon:  Burnell Blanks, MD;  Location: [facility];  Service: Cardiovascular;: Nonobstructive CAD  . CATARACT EXTRACTION BILATERAL W/ ANTERIOR VITRECTOMY Bilateral   . CHOLECYSTECTOMY    . COLONOSCOPY  2014  . DUPUYTREN / PALMAR FASCIOTOMY Bilateral 2007   x 3 to left and once to the right   . epidural injections     x 3  . FOOT TENDON SURGERY Right ~ 11/2015   "stepped in a hole and tore 2 tendons"  . JOINT REPLACEMENT    . KNEE ARTHROSCOPY Bilateral   . LUMBAR FUSION  10/2013   Dr. Lynann Bologna  . TONSILLECTOMY    . TOTAL KNEE ARTHROPLASTY Left 09/23/2016  . TOTAL KNEE ARTHROPLASTY Left 09/23/2016   Procedure: TOTAL KNEE ARTHROPLASTY;  Surgeon: Dorna Leitz, MD;  Location: [facility];  Service: Orthopedics;  Laterality: Left;  . TRANSTHORACIC ECHOCARDIOGRAM  08/07/2015   Severely reduced LVEF at 25-30% with diffuse HK. GR 2 DD. Ventricular dyssynergy secondary to LBBB. Small to moderate pericardial effusion but no hemodynamic compromise  . TRANSTHORACIC ECHOCARDIOGRAM  10/2015    (EF up from 25-30%): - Left ventricle: Poor acoustic windows -  difficult to see.    EF estimated at 45-50% with inferoseptal and possible posterior hypokinesis. GR 1 DD. No pericardial effusion.  . TRANSTHORACIC ECHOCARDIOGRAM  04/05/2019   EF 60 to 65% without wall motion normality.  GR 1 DD.  Mild LA dilation.  Trivial MR.  Frequent PVCs noted.  . TUBAL LIGATION    . UPPER GI ENDOSCOPY      FHx:    Reviewed / unchanged  SHx:    Reviewed / unchanged   Systems Review:  Constitutional: Denies fever, chills, wt changes, headaches, insomnia, fatigue, night sweats, change in appetite. Eyes: Denies redness, blurred vision, diplopia, discharge, itchy, watery eyes.  ENT: Denies discharge, congestion, post nasal drip, epistaxis, sore throat, earache, hearing loss, dental pain, tinnitus, vertigo, sinus pain, snoring.  CV: Denies chest pain, palpitations, irregular heartbeat, syncope, dyspnea, diaphoresis,  orthopnea, PND,  claudication or edema. Respiratory: denies cough, dyspnea, DOE, pleurisy, hoarseness, laryngitis, wheezing.  Gastrointestinal: Denies dysphagia, odynophagia, heartburn, reflux, water brash, abdominal pain or cramps, nausea, vomiting, bloating, diarrhea, constipation, hematemesis, melena, hematochezia  or hemorrhoids. Genitourinary: Denies dysuria, frequency, urgency, nocturia, hesitancy, discharge, hematuria or flank pain. Musculoskeletal: Denies arthralgias, myalgias, stiffness, jt. swelling, pain, limping or strain/sprain.  Skin: Denies pruritus, rash, hives, warts, acne, eczema or change in skin lesion(s). Neuro: No weakness, tremor, incoordination, spasms, paresthesia or pain. Psychiatric: Denies confusion, memory loss or sensory loss. Endo: Denies change in weight, skin or hair change.  Heme/Lymph: No excessive bleeding, bruising or enlarged lymph nodes.  Physical Exam  BP 128/78   Pulse 73   Temp 97.7 F (36.5 C)   Resp 16   Ht 5\' 5"  (1.651 m)   Wt 236 lb 6.4 oz (107.2 kg)   SpO2 99%   BMI 39.34 kg/m   Appears  Over nourished, well groomed  and in no distress.  Eyes: PERRLA, EOMs, conjunctiva no swelling or erythema. Sinuses: No frontal/maxillary tenderness ENT/Mouth: EAC's clear, TM's nl w/o erythema, bulging. Nares clear w/o erythema, swelling, exudates. Oropharynx clear without erythema or exudates. Oral hygiene is good. Tongue normal, non obstructing. Hearing intact.  Neck: Supple. Thyroid not palpable. Car 2+/2+ without bruits, nodes or JVD. Chest: Respirations nl with BS clear & equal w/o rales, rhonchi, wheezing or stridor.  Cor: Heart sounds normal w/ regular rate and rhythm without sig. murmurs, gallops, clicks or rubs. Peripheral pulses normal and equal  without edema.  Abdomen: Soft & bowel sounds normal. Non-tender w/o guarding, rebound, hernias, masses or organomegaly.  Lymphatics: Unremarkable.  Musculoskeletal: Full ROM all peripheral  extremities, joint stability, 5/5 strength and normal gait.  Skin: Warm, dry without exposed rashes, lesions or ecchymosis apparent.  Neuro: Cranial nerves intact, reflexes equal bilaterally. Sensory-motor testing grossly intact. Tendon reflexes grossly intact.  Pysch: Alert & oriented x 3.  Insight and judgement nl & appropriate. No ideations.  Assessment and Plan:  1. Essential hypertension  - Continue medication, monitor blood pressure at home.  - Continue DASH diet.  Reminder to go to the ER if any CP,  SOB, nausea, dizziness, severe HA, changes vision/speech.  - CBC with Differential/Platelet - COMPLETE METABOLIC PANEL WITH GFR - TSH  2. Hyperlipidemia, mixed - Continue diet/meds, exercise,& lifestyle modifications.  - Continue monitor periodic cholesterol/liver & renal functions   - Lipid panel - TSH  3. Abnormal glucose  - Continue diet, exercise  - Lifestyle modifications.  - Monitor appropriate labs.   - Hemoglobin A1c - Insulin, random  4. Vitamin D deficiency  - Continue supplementation.  - VITAMIN D 25 Hydroxy   5. Hypothyroidism  - TSH  6. Stage 3a chronic kidney disease (HCC)  - COMPLETE METABOLIC PANEL WITH GFR  7. Severe obesity with body mass index (BMI) of  39.34 with comorbidity ([place])   8. Medication management  - CBC with Differential/Platelet - COMPLETE METABOLIC PANEL WITH GFR - Lipid panel - TSH - Hemoglobin A1c - Insulin, random - VITAMIN D 25 Hydroxy         Discussed  regular exercise, BP monitoring, weight control to achieve/maintain BMI less than 25 and discussed med and SE's. Recommended labs to assess and monitor clinical status with further disposition pending results of labs.  I discussed the assessment and treatment plan with the patient. The patient was provided an opportunity to ask questions and all were answered. The patient agreed with the plan  and demonstrated an understanding of the instructions.  I provided over 30  minutes of exam, counseling, chart review and  complex critical decision making.         The patient was advised to call back or seek an in-person evaluation if the symptoms worsen or if the condition fails to improve as anticipated.   Kirtland Bouchard, MD

## 2020-09-02 NOTE — Patient Instructions (Signed)

## 2020-09-03 ENCOUNTER — Telehealth: Payer: Self-pay | Admitting: Cardiology

## 2020-09-03 DIAGNOSIS — I5042 Chronic combined systolic (congestive) and diastolic (congestive) heart failure: Secondary | ICD-10-CM | POA: Insufficient documentation

## 2020-09-03 LAB — CBC WITH DIFFERENTIAL/PLATELET
Absolute Monocytes: 720 cells/uL (ref 200–950)
Basophils Absolute: 90 cells/uL (ref 0–200)
Basophils Relative: 1 %
Eosinophils Absolute: 225 cells/uL (ref 15–500)
Eosinophils Relative: 2.5 %
HCT: 31.3 % — ABNORMAL LOW (ref 35.0–45.0)
Hemoglobin: 9.8 g/dL — ABNORMAL LOW (ref 11.7–15.5)
Lymphs Abs: 1665 cells/uL (ref 850–3900)
MCH: 27.2 pg (ref 27.0–33.0)
MCHC: 31.3 g/dL — ABNORMAL LOW (ref 32.0–36.0)
MCV: 86.9 fL (ref 80.0–100.0)
MPV: 11.2 fL (ref 7.5–12.5)
Monocytes Relative: 8 %
Neutro Abs: 6300 cells/uL (ref 1500–7800)
Neutrophils Relative %: 70 %
Platelets: 305 10*3/uL (ref 140–400)
RBC: 3.6 10*6/uL — ABNORMAL LOW (ref 3.80–5.10)
RDW: 16.5 % — ABNORMAL HIGH (ref 11.0–15.0)
Total Lymphocyte: 18.5 %
WBC: 9 10*3/uL (ref 3.8–10.8)

## 2020-09-03 LAB — COMPLETE METABOLIC PANEL WITH GFR
AG Ratio: 1.8 (calc) (ref 1.0–2.5)
ALT: 11 U/L (ref 6–29)
AST: 14 U/L (ref 10–35)
Albumin: 4.1 g/dL (ref 3.6–5.1)
Alkaline phosphatase (APISO): 80 U/L (ref 37–153)
BUN/Creatinine Ratio: 24 (calc) — ABNORMAL HIGH (ref 6–22)
BUN: 23 mg/dL (ref 7–25)
CO2: 29 mmol/L (ref 20–32)
Calcium: 9.3 mg/dL (ref 8.6–10.4)
Chloride: 104 mmol/L (ref 98–110)
Creat: 0.94 mg/dL — ABNORMAL HIGH (ref 0.60–0.93)
GFR, Est African American: 69 mL/min/{1.73_m2} (ref >=60)
GFR, Est Non African American: 60 mL/min/{1.73_m2} (ref >=60)
Globulin: 2.3 g/dL (calc) (ref 1.9–3.7)
Glucose, Bld: 97 mg/dL (ref 65–99)
Potassium: 4.2 mmol/L (ref 3.5–5.3)
Sodium: 139 mmol/L (ref 135–146)
Total Bilirubin: 0.3 mg/dL (ref 0.2–1.2)
Total Protein: 6.4 g/dL (ref 6.1–8.1)

## 2020-09-03 LAB — HEMOGLOBIN A1C
Hgb A1c MFr Bld: 6.2 % of total Hgb — ABNORMAL HIGH (ref <5.7)
Mean Plasma Glucose: 131 mg/dL
eAG (mmol/L): 7.3 mmol/L

## 2020-09-03 LAB — LIPID PANEL
Cholesterol: 139 mg/dL (ref <200)
HDL: 51 mg/dL (ref >=50)
LDL Cholesterol (Calc): 64 mg/dL (calc)
Non-HDL Cholesterol (Calc): 88 mg/dL (calc) (ref <130)
Total CHOL/HDL Ratio: 2.7 (calc) (ref <5.0)
Triglycerides: 159 mg/dL — ABNORMAL HIGH (ref <150)

## 2020-09-03 LAB — URINALYSIS, ROUTINE W REFLEX MICROSCOPIC
Bilirubin Urine: NEGATIVE
Glucose, UA: NEGATIVE
Hgb urine dipstick: NEGATIVE
Ketones, ur: NEGATIVE
Leukocytes,Ua: NEGATIVE
Nitrite: NEGATIVE
Protein, ur: NEGATIVE
Specific Gravity, Urine: 1.013 (ref 1.001–1.035)
pH: 6 (ref 5.0–8.0)

## 2020-09-03 LAB — URINE CULTURE
MICRO NUMBER:: 11934458
Result:: NO GROWTH
SPECIMEN QUALITY:: ADEQUATE

## 2020-09-03 LAB — VITAMIN D 25 HYDROXY (VIT D DEFICIENCY, FRACTURES): Vit D, 25-Hydroxy: 83 ng/mL (ref 30–100)

## 2020-09-03 LAB — TSH: TSH: 1.45 mIU/L (ref 0.40–4.50)

## 2020-09-03 LAB — INSULIN, RANDOM: Insulin: 76.5 u[IU]/mL — ABNORMAL HIGH

## 2020-09-03 NOTE — Telephone Encounter (Signed)
Pt's PCP Dr. Melford Aase would like for pt to start taking Phentermine for weight loss and pt is calling and  wants to know if it is ok to take. Please advise

## 2020-09-03 NOTE — Progress Notes (Signed)
============================================================ - Test results slightly outside the reference range are not unusual. If there is anything important, I will review this with you,  otherwise it is considered normal test values.  If you have further questions,  please do not hesitate to contact me at the office or via My Chart.  ============================================================ ============================================================  -  U/A - looks - OK - Culture still pending ============================================================ ============================================================  -  CBC shows Hgb / Red Cell count is improved from                                                           7.8 to now 9.8 gm % - much better,  But  . . .   - In anticipation of upcoming surgery, recommend refer back to Dr Jana Hakim for   Evaluation of your anemia.   - You may need iron transfusion or other evaluations or treatments.  ============================================================ ============================================================  -  Total Chol = 139   and LDL Chol = 64 - Both  Excellent   - Very low risk for Heart Attack  / Stroke ============================================================ ============================================================  -  A1c = 6.2% - still too high - Need stricter diet & Weight Loss.    Being diabetic or preDiabetic  has a  300% increased risk for               heart attack, stroke, cancer, and alzheimer- type vascular dementia.   It is very important that you work harder with diet by                     avoiding all foods that are white except chicken, fish & calliflower.  - Avoid white rice  (brown & wild rice is OK),   - Avoid white potatoes  (sweet potatoes in moderation is OK),   White bread or wheat bread or anything made out of   white flour like bagels, donuts, rolls, buns, biscuits,  cakes,  - pastries, cookies, pizza crust, and pasta (made from  white flour & egg whites)   - vegetarian pasta or spinach or wheat pasta is OK.  - Multigrain breads like Arnold's, Pepperidge Farm or   multigrain sandwich thins or high fiber breads like   [place] bread or "Dave's Killer" breads that are  4 to 5 grams fiber per slice !  are best.    Diet, exercise and weight loss can reverse and cure  diabetes in the early stages.    - Diet, exercise and weight loss is very important in the   control and prevention of complications of diabetes which  affects every system in your body, ie.   -Brain - dementia/stroke,  - eyes - glaucoma/blindness,  - heart - heart attack/heart failure,  - kidneys - dialysis,  - stomach - gastric paralysis,  - intestines - malabsorption,  - nerves - severe painful neuritis,  - circulation - gangrene & loss of a leg(s)  - and finally  . . . . . . . . . . . . . . . . . .    - cancer and Alzheimers. ============================================================ ============================================================  -  Insulin Level = 76.5 is very high                             (  Ideal or goal is less than 20)   -  and  shows insulin resistance   - a sign of early diabetes and associated with a 300 % greater risk for   heart attacks, strokes, cancer & Alzheimer type vascular dementia   - All this can be cured  and prevented with losing weight   - get Dr Fara Olden Fuhrman's book 'the End of Diabetes" and "the End of Dieting"   - and add many years of good health to your life. ============================================================ ============================================================  -  Vitamin D = 83 - Excellent  ! - Please keep dose of Vitamin D same  ============================================================ ============================================================  -  All Else - Kidneys - Electrolytes - Liver &  Thyroid    - all  Normal / OK ============================================================ ============================================================

## 2020-09-03 NOTE — Progress Notes (Signed)
============================================================ °============================================================ ° °-    Urine culture returned OK - > No Infection  °============================================================ °============================================================ °

## 2020-09-04 NOTE — Telephone Encounter (Signed)
Phentermine contraindicated in patients with heart disease and hypertension.  Would not use for this patient.

## 2020-09-04 NOTE — Telephone Encounter (Signed)
Spoke to patient. RN informed patient of  [facility] Pharmacist recommendation for not taking Phentermine.  Patient request a message be sent to Dr Melford Aase.- RN will route once Dr Ellyn Hack comments

## 2020-09-04 NOTE — Telephone Encounter (Signed)
Will also get input from [facility] pharmacist

## 2020-09-10 ENCOUNTER — Telehealth: Payer: Self-pay | Admitting: Oncology

## 2020-09-10 NOTE — Telephone Encounter (Signed)
Scheduled appts per 6/1 sch msg. Called pt, no answer. Left msg with appts date and times.

## 2020-09-10 NOTE — Progress Notes (Signed)
Patient class changed to ambulatory per Butch Penny with Dr Laurena Bering office.

## 2020-09-15 ENCOUNTER — Ambulatory Visit: Payer: Medicare Other | Admitting: Internal Medicine

## 2020-09-16 NOTE — Progress Notes (Signed)
Surgical Instructions    Your procedure is scheduled on Thursday 09/24/2020.    Report to [facility] Main Entrance "A" at 05:30 A.M., then check in with the Admitting office.    Call [phone] if you have problems or questions between now and the morning of surgery   If you have any questions prior to your surgery date call [phone]: Open Monday-Friday 8am-4pm    Remember:  Do not eat after midnight the night before your surgery  You may drink clear liquids until 04:30am the morning of your surgery.   Clear liquids allowed are: Water, Non-Citrus Juices (without pulp), Carbonated Beverages, Clear Tea, Black Coffee Only, and Gatorade   Enhanced Recovery after Surgery for Orthopedics Enhanced Recovery after Surgery is a protocol used to improve the stress on your body and your recovery after surgery.  Patient Instructions   . The day of surgery (if you have pre-diabetes/diabetes):  o Drink ONE small 10 oz bottle of water by 04:30 am the morning of surgery o This bottle was given to you during your hospital  pre-op appointment visit.  o Nothing else to drink after completing the  Small 10 oz bottle of water.         If you have questions, please contact your surgeon's office.     Take these medicines the morning of surgery with A SIP OF WATER: Acetaminophen (Tylenol) - if needed Bupropion (Wellbutrin XL) Duloxetine (Cymbalta) Gabapentin (Neurontin) Levothyroxine (Synthroid) Meclizine (Antivert) - if needed Metoprolol succinate (Toprol-XL) Mometasone (Nasonex) nasal spray - if needed Omeprazole (Prilosec) Tramadol (Ultram)   As of today, STOP taking any Aspirin or Aspirin-containing products (unless otherwise instructed by your surgeon); Nsaids - such as Aleve, Naproxen, Ibuprofen, Motrin, Advil, Goody's, BC's; all herbal medications, fish oil, and all vitamins.  Follow your surgeon's instructions on when to stop Eliquis.  If no instructions were given by your  surgeon then you will need to call the office to get those instructions.             Do not wear jewelry or makeup Do not wear lotions, powders, perfumes, or deodorant. Do not shave 48 hours prior to surgery. Do not bring valuables to the hospital - [employer] [facility] [employer] [facility] is not responsible for any belongings or valuables. Do not wear nail polish, gel polish, artificial nails, or any other type of covering on  natural nails including finger and toenails.  If patients have artificial nails, gel coating, etc. that need to be removed by a nail salon please have this removed prior to surgery or surgery may need to be canceled/delayed if the surgeon/ anesthesia feels like the patient is unable to be adequately monitored.   Do NOT Smoke (Tobacco/Vaping) or drink Alcohol 24 hours prior to your procedure  If you use a CPAP at night, you may bring all equipment for your overnight stay.   Contacts, glasses, hearing aids, dentures or partials may not be worn into surgery, please bring cases for these belongings   For patients admitted to the hospital, discharge time will be determined by your treatment team.   Patients discharged the day of surgery will not be allowed to drive home, and someone needs to stay with them for 24 hours.  ONLY 1 SUPPORT PERSON MAY BE PRESENT WHILE YOU ARE IN SURGERY. IF YOU ARE TO BE ADMITTED ONCE YOU ARE IN YOUR ROOM YOU WILL BE ALLOWED TWO (2) VISITORS.  Minor children may have two parents present. Special consideration for safety and  communication needs will be reviewed on a case by case basis.  Special instructions:    Oral Hygiene is also important to reduce your risk of infection.  Remember - BRUSH YOUR TEETH THE MORNING OF SURGERY WITH YOUR REGULAR TOOTHPASTE   [facility]- Preparing For Surgery  Before surgery, you can play an important role. Because skin is not sterile, your skin needs to be as free of germs as possible. You can reduce the number of germs on your skin by  washing with CHG (chlorahexidine gluconate) Soap before surgery.  CHG is an antiseptic cleaner which kills germs and bonds with the skin to continue killing germs even after washing.     Please do not use if you have an allergy to CHG or antibacterial soaps. If your skin becomes reddened/irritated stop using the CHG.  Do not shave (including legs and underarms) for at least 48 hours prior to first CHG shower. It is OK to shave your face.  Please follow these instructions carefully.    1.  Shower the NIGHT BEFORE SURGERY and the MORNING OF SURGERY with CHG Soap.   If you chose to wash your hair, wash your hair first as usual with your normal shampoo. After you shampoo, rinse your hair and body thoroughly to remove the shampoo.  Then [employer] and genitals (private parts) with your normal soap and rinse thoroughly to remove soap.  2. After that Use CHG Soap as you would any other liquid soap. You can apply CHG directly to the skin and wash gently with a scrungie or a clean washcloth.   3. Apply the CHG Soap to your body ONLY FROM THE NECK DOWN.  Do not use on open wounds or open sores. Avoid contact with your eyes, ears, mouth and genitals (private parts). Wash Face and genitals (private parts)  with your normal soap.   4. Wash thoroughly, paying special attention to the area where your surgery will be performed.  5. Thoroughly rinse your body with warm water from the neck down.  6. DO NOT shower/wash with your normal soap after using and rinsing off the CHG Soap.  7. Pat yourself dry with a CLEAN TOWEL.  8. Wear CLEAN PAJAMAS to bed the night before surgery  9. Place CLEAN SHEETS on your bed the night before your surgery  10. DO NOT SLEEP WITH PETS.   Day of Surgery:  Take a shower with CHG soap. Wear Clean/Comfortable clothing the morning of surgery Do not apply any deodorants/lotions.   Remember to brush your teeth WITH YOUR REGULAR TOOTHPASTE.   Please read over the following  fact sheets that you were given.

## 2020-09-17 ENCOUNTER — Telehealth: Payer: Self-pay

## 2020-09-17 ENCOUNTER — Encounter (HOSPITAL_COMMUNITY)
Admission: RE | Admit: 2020-09-17 | Discharge: 2020-09-17 | Disposition: A | Discharge status: Home / Self Care | Payer: Medicare Other | Source: Ambulatory Visit | Attending: Orthopedic Surgery | Admitting: Orthopedic Surgery

## 2020-09-17 ENCOUNTER — Other Ambulatory Visit: Payer: Self-pay

## 2020-09-17 ENCOUNTER — Encounter (HOSPITAL_COMMUNITY): Payer: Self-pay

## 2020-09-17 DIAGNOSIS — Z01812 Encounter for preprocedural laboratory examination: Secondary | ICD-10-CM | POA: Diagnosis present

## 2020-09-17 DIAGNOSIS — Z20822 Contact with and (suspected) exposure to covid-19: Secondary | ICD-10-CM | POA: Insufficient documentation

## 2020-09-17 HISTORY — DX: Heart failure, unspecified: I50.9

## 2020-09-17 LAB — SURGICAL PCR SCREEN
MRSA, PCR: NEGATIVE
Staphylococcus aureus: NEGATIVE

## 2020-09-17 LAB — CBC WITH DIFFERENTIAL/PLATELET
Abs Immature Granulocytes: 0.04 10*3/uL (ref 0.00–0.07)
Basophils Absolute: 0.1 10*3/uL (ref 0.0–0.1)
Basophils Relative: 1 %
Eosinophils Absolute: 0.2 10*3/uL (ref 0.0–0.5)
Eosinophils Relative: 3 %
HCT: 35.5 % — ABNORMAL LOW (ref 36.0–46.0)
Hemoglobin: 11.2 g/dL — ABNORMAL LOW (ref 12.0–15.0)
Immature Granulocytes: 0 %
Lymphocytes Relative: 18 %
Lymphs Abs: 1.6 10*3/uL (ref 0.7–4.0)
MCH: 28.2 pg (ref 26.0–34.0)
MCHC: 31.5 g/dL (ref 30.0–36.0)
MCV: 89.4 fL (ref 80.0–100.0)
Monocytes Absolute: 0.8 10*3/uL (ref 0.1–1.0)
Monocytes Relative: 9 %
Neutro Abs: 6.2 10*3/uL (ref 1.7–7.7)
Neutrophils Relative %: 69 %
Platelets: 284 10*3/uL (ref 150–400)
RBC: 3.97 MIL/uL (ref 3.87–5.11)
RDW: 15.4 % (ref 11.5–15.5)
WBC: 8.9 10*3/uL (ref 4.0–10.5)
nRBC: 0 % (ref 0.0–0.2)

## 2020-09-17 LAB — APTT: aPTT: 33 seconds (ref 24–36)

## 2020-09-17 LAB — TYPE AND SCREEN
ABO/RH(D): A POS
Antibody Screen: NEGATIVE

## 2020-09-17 LAB — PROTIME-INR
INR: 1.2 (ref 0.8–1.2)
Prothrombin Time: 14.7 seconds (ref 11.4–15.2)

## 2020-09-17 LAB — SARS CORONAVIRUS 2 (TAT 6-24 HRS): SARS Coronavirus 2: NEGATIVE

## 2020-09-17 LAB — GLUCOSE, CAPILLARY: Glucose-Capillary: 128 mg/dL — ABNORMAL HIGH (ref 70–99)

## 2020-09-17 NOTE — Telephone Encounter (Signed)
   [facility] Pre-operative Risk Assessment    Patient Name: [name] [name]  DOB: 05/25/1945  MRN: [id number]   HEARTCARE STAFF: - Please ensure there is not already an duplicate clearance open for this procedure. - Under Visit Info/Reason for Call, type in Other and utilize the format Clearance MM/DD/YY or Clearance TBD. Do not use dashes or single digits. - If request is for dental extraction, please clarify the # of teeth to be extracted. - If the patient is currently at the dentist's office, call Pre-Op APP to address. If the patient is not currently in the dentist office, please route to the Pre-Op pool  Request for surgical clearance:  What type of surgery is being performed? Anterior Cervical Decompression Fusion Cervical 6-7 with Instrumentation and Allograft   When is this surgery scheduled? 09/24/20   What type of clearance is required (medical clearance vs. Pharmacy clearance to hold med vs. Both)? BOTH  Are there any medications that need to be held prior to surgery and how long? [place] name and name of physician performing surgery? Phylliss Bob MD  What is the office phone number? [phone]   7.   What is the office fax number? [phone]  8.   Anesthesia type (None, local, MAC, general) ? Not listed   Eliezer Lofts B Styles Fambro 09/17/2020, 5:14 PM  _________________________________________________________________   (provider comments below)

## 2020-09-17 NOTE — Progress Notes (Signed)
PCP - Dr. Unk Pinto Cardiologist - Dr. Ellyn Hack sees for a fib  Chest x-ray - Not indicated EKG - 01/22/20 Stress Test - Years ago no need for f/u at that time ECHO - 04/05/19 Cardiac Cath - 08/10/15  Sleep Study - Denies  DM - Prediabetic CBG at PAT appt 128 after lunch  Blood Thinner Instructions:Eliquis, patient to call Dr. Lynann Bologna for instruction on when to stop.   ERAS Protcol -Yes PRE-SURGERY water   COVID TEST- 09/17/20   Anesthesia review: Yes cardiac history of A fib  Patient denies shortness of breath, fever, cough and chest pain at PAT appointment   All instructions explained to the patient, with a verbal understanding of the material. Patient agrees to go over the instructions while at home for a better understanding. Patient also instructed to wear mask in public after being tested for COVID-19. The opportunity to ask questions was provided.

## 2020-09-18 NOTE — Telephone Encounter (Signed)
Patient with diagnosis of atrial fibrillation [name] for anticoagulation.    Procedure: anterior cervical decompression fusion cervical 6-7 Date of procedure: 09/24/20   CHA2DS2-VASc Score = 4  This indicates a 4.8% annual risk of stroke. The patient's score is based upon: CHF History: Yes HTN History: Yes Diabetes History: No Stroke History: No Vascular Disease History: No Age Score: 1 Gender Score: 1   CrCl 64.2 (adjusted body weight) Platelet count 284  Per office protocol, patient can hold Eliquis for 3 days prior to procedure.   Patient will not need bridging with Lovenox (enoxaparin) around procedure.

## 2020-09-18 NOTE — Progress Notes (Signed)
Patient is listed as ambulatory surgery and will not need another covid test prior to OR.    Per Butch Penny at Dr. Laurena Bering office, patient now has someone to stay with her and will be able to go home after surgery.   Therefore, patient will not need another covid test prior to surgery.

## 2020-09-21 NOTE — Telephone Encounter (Signed)
    [name] [name] DOB:  May 27, 1945  MRN:  [id number]   Primary Cardiologist: Glenetta Hew, MD  Chart reviewed as part of pre-operative protocol coverage. Given past medical history and time since last visit, based on ACC/AHA guidelines, [name] [name] would be at acceptable risk for the planned procedure without further cardiovascular testing.   Patient with diagnosis of atrial fibrillation [name] for anticoagulation.     Procedure: anterior cervical decompression fusion cervical 6-7 Date of procedure: 09/24/20    CHA2DS2-VASc Score = 4  This indicates a 4.8% annual risk of stroke. The patient's score is based upon: CHF History: Yes HTN History: Yes Diabetes History: No Stroke History: No Vascular Disease History: No Age Score: 1 Gender Score: 1   CrCl 64.2 (adjusted body weight) Platelet count 284   Per office protocol, patient can hold Eliquis for 3 days prior to procedure. Patient will not need bridging with Lovenox (enoxaparin) around procedure.  I will route this recommendation to the requesting party via Epic fax function and remove from pre-op pool.  Please call with questions.  Kathyrn Drown, NP 09/21/2020, 9:14 AM

## 2020-09-23 NOTE — Anesthesia Preprocedure Evaluation (Addendum)
Anesthesia Evaluation  Patient identified by MRN, date of birth, ID band Patient awake    Reviewed: Allergy & Precautions, NPO status , Patient's Chart, lab work & pertinent test results, reviewed documented beta blocker date and time   History of Anesthesia Complications (+) PONV and history of anesthetic complications  Airway Mallampati: III  TM Distance: >3 FB Neck ROM: Limited    Dental  (+) Dental Advisory Given, Teeth Intact   Pulmonary neg pulmonary ROS,    Pulmonary exam normal        Cardiovascular hypertension, Pt. on medications and Pt. on home beta blockers +CHF  Normal cardiovascular exam+ dysrhythmias Atrial Fibrillation    '20 TTE - EF 60 to 65%. Abnormal septal motion consistent with left bundle branch block. Grade I  diastolic dysfunction (impaired relaxation).  LA was mildly dilated. Trivial MR. Trivial TR. Frequent PVCs.    Neuro/Psych  Headaches, PSYCHIATRIC DISORDERS Anxiety Depression    GI/Hepatic Neg liver ROS, GERD  Medicated and Controlled,  Endo/Other  Hypothyroidism Morbid obesity  Renal/GU negative Renal ROS     Musculoskeletal  (+) Arthritis ,  Chronic back pain    Abdominal   Peds  Hematology  (+) anemia ,  [name], last on Sunday     Anesthesia Other Findings Covid test negative   Reproductive/Obstetrics  Breast cancer                             Anesthesia Physical Anesthesia Plan  ASA: 3  Anesthesia Plan: General   Post-op Pain Management:    Induction: Intravenous  PONV Risk Score and Plan: 4 or greater and Treatment may vary due to age or medical condition, Ondansetron and Dexamethasone  Airway Management Planned: Oral ETT and Video Laryngoscope Planned  Additional Equipment: None  Intra-op Plan:   Post-operative Plan: Extubation in OR  Informed Consent: I have reviewed the patients History and Physical, chart, labs and  discussed the procedure including the risks, benefits and alternatives for the proposed anesthesia with the patient or authorized representative who has indicated his/her understanding and acceptance.     Dental advisory given  Plan Discussed with: CRNA and Anesthesiologist  Anesthesia Plan Comments:        Anesthesia Quick Evaluation

## 2020-09-24 ENCOUNTER — Ambulatory Visit (HOSPITAL_COMMUNITY): Payer: Medicare Other

## 2020-09-24 ENCOUNTER — Ambulatory Visit (HOSPITAL_COMMUNITY): Payer: Medicare Other | Admitting: Anesthesiology

## 2020-09-24 ENCOUNTER — Other Ambulatory Visit: Payer: Self-pay

## 2020-09-24 ENCOUNTER — Encounter (HOSPITAL_COMMUNITY): Payer: Self-pay | Admitting: Orthopedic Surgery

## 2020-09-24 ENCOUNTER — Ambulatory Visit (HOSPITAL_COMMUNITY)
Admission: RE | Disposition: A | Discharge status: Home / Self Care | Payer: Self-pay | Source: Home / Self Care | Attending: Orthopedic Surgery

## 2020-09-24 ENCOUNTER — Ambulatory Visit (HOSPITAL_COMMUNITY)
Admission: RE | Admit: 2020-09-24 | Discharge: 2020-09-24 | Disposition: A | Discharge status: Home / Self Care | Payer: Medicare Other | Attending: Orthopedic Surgery | Admitting: Orthopedic Surgery

## 2020-09-24 DIAGNOSIS — M4802 Spinal stenosis, cervical region: Secondary | ICD-10-CM | POA: Diagnosis not present

## 2020-09-24 DIAGNOSIS — Z79890 Hormone replacement therapy (postmenopausal): Secondary | ICD-10-CM | POA: Diagnosis not present

## 2020-09-24 DIAGNOSIS — Z888 Allergy status to other drugs, medicaments and biological substances status: Secondary | ICD-10-CM | POA: Diagnosis not present

## 2020-09-24 DIAGNOSIS — Z419 Encounter for procedure for purposes other than remedying health state, unspecified: Secondary | ICD-10-CM

## 2020-09-24 DIAGNOSIS — Z96652 Presence of left artificial knee joint: Secondary | ICD-10-CM | POA: Diagnosis not present

## 2020-09-24 DIAGNOSIS — Z885 Allergy status to narcotic agent status: Secondary | ICD-10-CM | POA: Insufficient documentation

## 2020-09-24 DIAGNOSIS — M5412 Radiculopathy, cervical region: Secondary | ICD-10-CM | POA: Diagnosis present

## 2020-09-24 DIAGNOSIS — Z7901 Long term (current) use of anticoagulants: Secondary | ICD-10-CM | POA: Insufficient documentation

## 2020-09-24 DIAGNOSIS — Z79899 Other long term (current) drug therapy: Secondary | ICD-10-CM | POA: Diagnosis not present

## 2020-09-24 DIAGNOSIS — Z882 Allergy status to sulfonamides status: Secondary | ICD-10-CM | POA: Insufficient documentation

## 2020-09-24 HISTORY — PX: ANTERIOR CERVICAL DECOMP/DISCECTOMY FUSION: SHX1161

## 2020-09-24 LAB — GLUCOSE, CAPILLARY: Glucose-Capillary: 112 mg/dL — ABNORMAL HIGH (ref 70–99)

## 2020-09-24 SURGERY — ANTERIOR CERVICAL DECOMPRESSION/DISCECTOMY FUSION 2 LEVELS
Anesthesia: General

## 2020-09-24 MED ORDER — SCOPOLAMINE 1 MG/3DAYS TD PT72
MEDICATED_PATCH | TRANSDERMAL | Status: AC
  Filled 2020-09-24: qty 2

## 2020-09-24 MED ORDER — FENTANYL CITRATE (PF) 100 MCG/2ML IJ SOLN
INTRAMUSCULAR | Status: AC
  Filled 2020-09-24: qty 2

## 2020-09-24 MED ORDER — THROMBIN (RECOMBINANT) 20000 UNITS EX SOLR
CUTANEOUS | Status: AC
  Filled 2020-09-24: qty 20000

## 2020-09-24 MED ORDER — LACTATED RINGERS IV SOLN: INTRAVENOUS | Status: DC | PRN

## 2020-09-24 MED ORDER — 0.9 % SODIUM CHLORIDE (POUR BTL) OPTIME
TOPICAL | Status: DC | PRN
  Administered 2020-09-24: 1000 mL

## 2020-09-24 MED ORDER — OXYCODONE HCL 5 MG PO TABS
ORAL_TABLET | ORAL | Status: AC
  Filled 2020-09-24: qty 1

## 2020-09-24 MED ORDER — ONDANSETRON HCL 4 MG/2ML IJ SOLN: 4.0000 mg | Freq: Once | INTRAMUSCULAR | Status: DC | PRN

## 2020-09-24 MED ORDER — OXYCODONE HCL 5 MG PO TABS
5.0000 mg | ORAL_TABLET | Freq: Once | ORAL | Status: AC | PRN
  Administered 2020-09-24: 5 mg via ORAL

## 2020-09-24 MED ORDER — FENTANYL CITRATE (PF) 100 MCG/2ML IJ SOLN
INTRAMUSCULAR | Status: DC | PRN
  Administered 2020-09-24 (×3): 50 ug via INTRAVENOUS

## 2020-09-24 MED ORDER — PHENYLEPHRINE HCL-NACL 20-0.9 MG/250ML-% IV SOLN
INTRAVENOUS | Status: DC | PRN
  Administered 2020-09-24: 60 ug/min via INTRAVENOUS

## 2020-09-24 MED ORDER — FENTANYL CITRATE (PF) 250 MCG/5ML IJ SOLN
INTRAMUSCULAR | Status: AC
  Filled 2020-09-24: qty 5

## 2020-09-24 MED ORDER — ROCURONIUM BROMIDE 10 MG/ML (PF) SYRINGE
PREFILLED_SYRINGE | INTRAVENOUS | Status: AC
  Filled 2020-09-24: qty 10

## 2020-09-24 MED ORDER — MIDAZOLAM HCL 2 MG/2ML IJ SOLN
INTRAMUSCULAR | Status: DC | PRN
  Administered 2020-09-24 (×2): 1 mg via INTRAVENOUS

## 2020-09-24 MED ORDER — CHLORHEXIDINE GLUCONATE 0.12 % MT SOLN
OROMUCOSAL | Status: AC
  Administered 2020-09-24: 15 mL via OROMUCOSAL
  Filled 2020-09-24: qty 15

## 2020-09-24 MED ORDER — SUGAMMADEX SODIUM 200 MG/2ML IV SOLN
INTRAVENOUS | Status: DC | PRN
  Administered 2020-09-24: 200 mg via INTRAVENOUS

## 2020-09-24 MED ORDER — ROCURONIUM BROMIDE 10 MG/ML (PF) SYRINGE
PREFILLED_SYRINGE | INTRAVENOUS | Status: DC | PRN
  Administered 2020-09-24 (×2): 10 mg via INTRAVENOUS
  Administered 2020-09-24: 60 mg via INTRAVENOUS

## 2020-09-24 MED ORDER — METHOCARBAMOL 500 MG PO TABS: 500.0000 mg | ORAL_TABLET | Freq: Four times a day (QID) | ORAL | 0 refills | Status: DC | PRN

## 2020-09-24 MED ORDER — POVIDONE-IODINE 7.5 % EX SOLN
Freq: Once | CUTANEOUS | Status: DC
  Filled 2020-09-24: qty 118

## 2020-09-24 MED ORDER — PROPOFOL 10 MG/ML IV BOLUS
INTRAVENOUS | Status: AC
  Filled 2020-09-24: qty 20

## 2020-09-24 MED ORDER — BUPIVACAINE-EPINEPHRINE (PF) 0.25% -1:200000 IJ SOLN
INTRAMUSCULAR | Status: AC
  Filled 2020-09-24: qty 30

## 2020-09-24 MED ORDER — PROPOFOL 10 MG/ML IV BOLUS
INTRAVENOUS | Status: DC | PRN
  Administered 2020-09-24: 100 mg via INTRAVENOUS

## 2020-09-24 MED ORDER — ONDANSETRON HCL 4 MG/2ML IJ SOLN
INTRAMUSCULAR | Status: AC
  Filled 2020-09-24: qty 2

## 2020-09-24 MED ORDER — METHOCARBAMOL 500 MG PO TABS: 500.0000 mg | ORAL_TABLET | Freq: Once | ORAL | Status: DC

## 2020-09-24 MED ORDER — OXYCODONE HCL 5 MG/5ML PO SOLN
5.0000 mg | Freq: Once | ORAL | Status: AC | PRN
Start: 2020-09-24 — End: 2020-09-24

## 2020-09-24 MED ORDER — ORAL CARE MOUTH RINSE: 15.0000 mL | Freq: Once | OROMUCOSAL | Status: AC

## 2020-09-24 MED ORDER — THROMBIN 20000 UNITS EX SOLR
CUTANEOUS | Status: DC | PRN
  Administered 2020-09-24: 20000 [IU] via TOPICAL

## 2020-09-24 MED ORDER — METHOCARBAMOL 500 MG PO TABS
ORAL_TABLET | ORAL | Status: AC
  Administered 2020-09-24: 500 mg
  Filled 2020-09-24: qty 1

## 2020-09-24 MED ORDER — PHENYLEPHRINE 40 MCG/ML (10ML) SYRINGE FOR IV PUSH (FOR BLOOD PRESSURE SUPPORT)
PREFILLED_SYRINGE | INTRAVENOUS | Status: DC | PRN
  Administered 2020-09-24: 80 ug via INTRAVENOUS

## 2020-09-24 MED ORDER — CHLORHEXIDINE GLUCONATE 0.12 % MT SOLN: 15.0000 mL | Freq: Once | OROMUCOSAL | Status: AC

## 2020-09-24 MED ORDER — LIDOCAINE 2% (20 MG/ML) 5 ML SYRINGE
INTRAMUSCULAR | Status: DC | PRN
  Administered 2020-09-24: 60 mg via INTRAVENOUS

## 2020-09-24 MED ORDER — LACTATED RINGERS IV SOLN: INTRAVENOUS | Status: DC

## 2020-09-24 MED ORDER — CEFAZOLIN SODIUM-DEXTROSE 2-4 GM/100ML-% IV SOLN
2.0000 g | INTRAVENOUS | Status: AC
  Administered 2020-09-24: 2 g via INTRAVENOUS
  Filled 2020-09-24: qty 100

## 2020-09-24 MED ORDER — DEXAMETHASONE SODIUM PHOSPHATE 10 MG/ML IJ SOLN
INTRAMUSCULAR | Status: DC | PRN
  Administered 2020-09-24: 5 mg via INTRAVENOUS

## 2020-09-24 MED ORDER — BUPIVACAINE-EPINEPHRINE 0.25% -1:200000 IJ SOLN
INTRAMUSCULAR | Status: DC | PRN
  Administered 2020-09-24: 5 mL

## 2020-09-24 MED ORDER — ONDANSETRON HCL 4 MG/2ML IJ SOLN
INTRAMUSCULAR | Status: DC | PRN
  Administered 2020-09-24: 4 mg via INTRAVENOUS

## 2020-09-24 MED ORDER — MIDAZOLAM HCL 2 MG/2ML IJ SOLN
INTRAMUSCULAR | Status: AC
  Filled 2020-09-24: qty 2

## 2020-09-24 MED ORDER — FENTANYL CITRATE (PF) 100 MCG/2ML IJ SOLN
25.0000 ug | INTRAMUSCULAR | Status: DC | PRN
  Administered 2020-09-24: 50 ug via INTRAVENOUS

## 2020-09-24 MED ORDER — PHENYLEPHRINE 40 MCG/ML (10ML) SYRINGE FOR IV PUSH (FOR BLOOD PRESSURE SUPPORT)
PREFILLED_SYRINGE | INTRAVENOUS | Status: AC
  Filled 2020-09-24: qty 10

## 2020-09-24 MED ORDER — LIDOCAINE HCL (PF) 2 % IJ SOLN
INTRAMUSCULAR | Status: AC
  Filled 2020-09-24: qty 5

## 2020-09-24 MED ORDER — HYDROCODONE-ACETAMINOPHEN 5-325 MG PO TABS: 1.0000 | ORAL_TABLET | Freq: Four times a day (QID) | ORAL | 0 refills | Status: DC | PRN

## 2020-09-24 MED ORDER — DEXAMETHASONE SODIUM PHOSPHATE 10 MG/ML IJ SOLN
INTRAMUSCULAR | Status: AC
  Filled 2020-09-24: qty 1

## 2020-09-24 SURGICAL SUPPLY — 74 items
APL SKNCLS STERI-STRIP NONHPOA (GAUZE/BANDAGES/DRESSINGS) ×1
BENZOIN TINCTURE PRP APPL 2/3 (GAUZE/BANDAGES/DRESSINGS) ×3 IMPLANT
BIT DRILL NEURO 2X3.1 SFT TUCH (MISCELLANEOUS) ×1 IMPLANT
BIT DRILL SKYLINE 12MM (BIT) ×1 IMPLANT
BLADE CLIPPER SURG (BLADE) ×3 IMPLANT
BLADE SURG 15 STRL LF DISP TIS (BLADE) ×1 IMPLANT
BLADE SURG 15 STRL SS (BLADE) ×3
CARTRIDGE OIL MAESTRO DRILL (MISCELLANEOUS) ×1 IMPLANT
CLOSURE STERI-STRIP 1/2X4 (GAUZE/BANDAGES/DRESSINGS) ×1
CLOSURE WOUND 1/2 X4 (GAUZE/BANDAGES/DRESSINGS) ×1
CLSR STERI-STRIP ANTIMIC 1/2X4 (GAUZE/BANDAGES/DRESSINGS) ×1 IMPLANT
CORD BIPOLAR FORCEPS 12FT (ELECTRODE) ×3 IMPLANT
COVER SURGICAL LIGHT HANDLE (MISCELLANEOUS) ×3 IMPLANT
COVER WAND RF STERILE (DRAPES) ×3 IMPLANT
DECANTER SPIKE VIAL GLASS SM (MISCELLANEOUS) ×3 IMPLANT
DEVICE ENDSKLTN TCERV VBR SM 6 (Orthopedic Implant) IMPLANT
DIFFUSER DRILL AIR PNEUMATIC (MISCELLANEOUS) ×3 IMPLANT
DRAPE C-ARM 42X72 X-RAY (DRAPES) ×3 IMPLANT
DRAPE POUCH INSTRU U-SHP 10X18 (DRAPES) ×3 IMPLANT
DRAPE SURG 17X23 STRL (DRAPES) ×12 IMPLANT
DRILL BIT SKYLINE 12MM (BIT) ×3
DRILL NEURO 2X3.1 SOFT TOUCH (MISCELLANEOUS) ×6
DURAPREP 26ML APPLICATOR (WOUND CARE) ×3 IMPLANT
ELECT COATED BLADE 2.86 ST (ELECTRODE) ×3 IMPLANT
ELECT REM PT RETURN 9FT ADLT (ELECTROSURGICAL) ×3
ELECTRODE REM PT RTRN 9FT ADLT (ELECTROSURGICAL) ×1 IMPLANT
ENDOSKELETON T CERV VBR SM 6MM (Orthopedic Implant) ×3 IMPLANT
GAUZE 4X4 16PLY RFD (DISPOSABLE) ×3 IMPLANT
GAUZE SPONGE 4X4 12PLY STRL (GAUZE/BANDAGES/DRESSINGS) ×3 IMPLANT
GLOVE BIO SURGEON STRL SZ7 (GLOVE) ×3 IMPLANT
GLOVE BIO SURGEON STRL SZ8 (GLOVE) ×3 IMPLANT
GLOVE SRG 8 PF TXTR STRL LF DI (GLOVE) ×1 IMPLANT
GLOVE SURG UNDER POLY LF SZ7 (GLOVE) ×6 IMPLANT
GLOVE SURG UNDER POLY LF SZ8 (GLOVE) ×3
GOWN STRL REUS W/ TWL LRG LVL3 (GOWN DISPOSABLE) ×1 IMPLANT
GOWN STRL REUS W/ TWL XL LVL3 (GOWN DISPOSABLE) ×1 IMPLANT
GOWN STRL REUS W/TWL LRG LVL3 (GOWN DISPOSABLE) ×3
GOWN STRL REUS W/TWL XL LVL3 (GOWN DISPOSABLE) ×3
INTERLOCK LRDTC CRVCL VBR 7MM (Bone Implant) ×1 IMPLANT
IV CATH 14GX2 1/4 (CATHETERS) ×3 IMPLANT
KIT BASIN OR (CUSTOM PROCEDURE TRAY) ×3 IMPLANT
KIT TURNOVER KIT B (KITS) ×3 IMPLANT
LORDOTIC CERVICAL VBR 7MM SM (Bone Implant) ×3 IMPLANT
MANIFOLD NEPTUNE II (INSTRUMENTS) ×3 IMPLANT
NDL PRECISIONGLIDE 27X1.5 (NEEDLE) ×1 IMPLANT
NDL SPNL 20GX3.5 QUINCKE YW (NEEDLE) ×1 IMPLANT
NEEDLE PRECISIONGLIDE 27X1.5 (NEEDLE) ×3 IMPLANT
NEEDLE SPNL 20GX3.5 QUINCKE YW (NEEDLE) ×3 IMPLANT
NS IRRIG 1000ML POUR BTL (IV SOLUTION) ×3 IMPLANT
OIL CARTRIDGE MAESTRO DRILL (MISCELLANEOUS) ×3
PACK ORTHO CERVICAL (CUSTOM PROCEDURE TRAY) ×3 IMPLANT
PAD ARMBOARD 7.5X6 YLW CONV (MISCELLANEOUS) ×6 IMPLANT
PATTIES SURGICAL .5 X.5 (GAUZE/BANDAGES/DRESSINGS) ×2 IMPLANT
PATTIES SURGICAL .5 X1 (DISPOSABLE) ×3 IMPLANT
PIN DISTRACTION 12MM (PIN) ×6
PIN DSTRCT 12XNS SS ACIS (PIN) ×2 IMPLANT
PLATE TWO LEVEL SKYLINE 30MM (Plate) ×3 IMPLANT
POSITIONER HEAD DONUT 9IN (MISCELLANEOUS) ×3 IMPLANT
PUTTY BONE DBX 2.5 MIS (Bone Implant) ×2 IMPLANT
SCREW SKYLINE VARIABLE LG (Screw) ×18 IMPLANT
SPONGE INTESTINAL PEANUT (DISPOSABLE) ×3 IMPLANT
SPONGE SURGIFOAM ABS GEL 100 (HEMOSTASIS) ×3 IMPLANT
STRIP CLOSURE SKIN 1/2X4 (GAUZE/BANDAGES/DRESSINGS) ×2 IMPLANT
SUT MNCRL AB 4-0 PS2 18 (SUTURE) ×3 IMPLANT
SUT VIC AB 2-0 CT2 18 VCP726D (SUTURE) ×3 IMPLANT
SYR BULB IRRIG 60ML STRL (SYRINGE) ×3 IMPLANT
SYR CONTROL 10ML LL (SYRINGE) ×9 IMPLANT
TAPE CLOTH 4X10 WHT NS (GAUZE/BANDAGES/DRESSINGS) ×3 IMPLANT
TAPE CLOTH SURG 4X10 WHT LF (GAUZE/BANDAGES/DRESSINGS) ×2 IMPLANT
TAPE UMBILICAL COTTON 1/8X30 (MISCELLANEOUS) ×3 IMPLANT
TOWEL GREEN STERILE (TOWEL DISPOSABLE) ×3 IMPLANT
TOWEL GREEN STERILE FF (TOWEL DISPOSABLE) ×3 IMPLANT
WATER STERILE IRR 1000ML POUR (IV SOLUTION) ×3 IMPLANT
YANKAUER SUCT BULB TIP NO VENT (SUCTIONS) ×3 IMPLANT

## 2020-09-24 NOTE — Op Note (Signed)
PATIENT NAME: [name] [name] RECORD NO.:   [id number]    DATE OF BIRTH: 03/31/1940   DATE OF PROCEDURE: 09/24/2020                               OPERATIVE REPORT     PREOPERATIVE DIAGNOSES: 1. Left-sided cervical radiculopathy. 2. Spinal stenosis spanning C5-C7.   POSTOPERATIVE DIAGNOSES: 1. Left-sided cervical radiculopathy. 2. Spinal stenosis spanning C5-C7.   PROCEDURE: 1. Anterior cervical decompression and fusion C5/6, C6/7. 2. Placement of anterior instrumentation, C5-C7. 3. Insertion of interbody device x 2 (Titan intervertebral spacers). 4. Intraoperative use of fluoroscopy. 5. Use of morselized allograft - DBX-mix.   SURGEON:  Phylliss Bob, MD   ASSISTANT:  Pricilla Holm, PA-C.   ANESTHESIA:  General endotracheal anesthesia.   COMPLICATIONS:  None.   DISPOSITION:  Stable.   ESTIMATED BLOOD LOSS:  Minimal.   INDICATIONS FOR SURGERY:  Briefly, [name] is a pleasant 75 y.o. year- old female, who did present to me with pain and weakness in the left arm.  The patient's MRI did reveal the findings noted above.  Given the patient's ongoing rather debilitating pain and lack of improvement with appropriate treatment measures, we did discuss proceeding with the procedure noted above.  The patient was fully aware of the risks and limitations of surgery as outlined in my preoperative note.   OPERATIVE DETAILS:  On 09/24/2020  the patient was brought to surgery and general endotracheal anesthesia was administered.  The patient was placed supine on the hospital bed. The neck was gently extended.  All bony prominences were meticulously padded.  The neck was prepped and draped in the usual sterile fashion.  At this point, I did make a left-sided transverse incision.  The platysma was incised.  A Smith-Robinson approach was used and the anterior spine was identified. A self-retaining retractor was placed.  I then subperiosteally exposed the vertebral  bodies from C5-C7.  Caspar pins were then placed into the C6 and C7 vertebral bodies and distraction was applied.  A thorough and complete C6-7 intervertebral diskectomy was performed.  The posterior longitudinal ligament was identified and entered using a nerve hook.  I then used #1 followed by #2 Kerrison to perform a thorough and complete intervertebral diskectomy.  The spinal canal was thoroughly decompressed, as was the right and left neuroforamena.  The endplates were then prepared and the appropriate-sized intervertebral spacer was then packed with DBX-mix and tamped into position in the usual fashion.  The lower Caspar pin was then removed and placed into the C5 vertebral body and once again, distraction was applied across the C5-6 intervertebral space.  I then again performed a thorough and complete diskectomy, thoroughly decompressing the spinal canal and bilateral neuroforamena.  After preparing the endplates, the appropriate-sized intervertebral spacer was packed with DBX-mix and tamped into position.  The Caspar pins then were removed and bone wax was placed in their place.  The appropriate-sized anterior cervical plate was placed over the anterior spine.  12 mm variable angle screws were placed, 2 in each vertebral body from C5-C7 for a total of 6 vertebral body screws.  The screws were then locked to the plate using the Cam locking mechanism.  I was very pleased with the final fluoroscopic images.  The wound was then irrigated.  The wound was then explored for any undue bleeding and there was no bleeding noted. The wound was  then closed in layers using 2-0 Vicryl, followed by 4-0 Monocryl.  Benzoin and Steri-Strips were applied, followed by sterile dressing.  All instrument counts were correct at the termination of the procedure.   Of note, Pricilla Holm, PA-C, was my assistant throughout surgery, and did aid in retraction, suctioning, placement of the hardware, and  closure from start to finish.       Phylliss Bob, MD

## 2020-09-24 NOTE — Anesthesia Procedure Notes (Addendum)
Procedure Name: Intubation Date/Time: 09/24/2020 7:53 AM Performed by: Asher Muir, CRNA Pre-anesthesia Checklist: Patient identified, Emergency Drugs available, Suction available and Patient being monitored Patient Re-evaluated:Patient Re-evaluated prior to induction Oxygen Delivery Method: Circle system utilized and Simple face mask Preoxygenation: Pre-oxygenation with 100% oxygen Induction Type: IV induction Ventilation: Mask ventilation without difficulty and Oral airway inserted - appropriate to patient size Laryngoscope Size: Mac, 3 and Glidescope Grade View: Grade I Tube type: Oral Tube size: 7.0 mm Number of attempts: 1 Airway Equipment and Method: Video-laryngoscopy and Rigid stylet Placement Confirmation: ETT inserted through vocal cords under direct vision, positive ETCO2 and breath sounds checked- equal and bilateral Secured at: 21 (right lip) cm Tube secured with: Tape Dental Injury: Teeth and Oropharynx as per pre-operative assessment

## 2020-09-24 NOTE — H&P (Signed)
PREOPERATIVE H&P  Chief Complaint: Left arm pain and weakness  HPI: [name] [name] is a 75 y.o. 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   CHF (congestive heart failure) (HCC)    Chronic back pain    cyst sitting on L4-5;slipped disc   Depression    takes Cymbalta daily   Diverticulosis    Dysrhythmia    GERD (gastroesophageal reflux disease)    takes Omeprazole daily   Headache(784.0)    rare   History of gastric ulcer at age 60   History of pancreatitis 03/27/2008   History of ulcer disease 08/06/2015   Hyperlipidemia    Hypertension    takes Cardura nightly and Verapamil daily   Hypothyroidism    Insomnia    takes Restoril nightly   Malignant neoplasm of lower-inner quadrant of left breast in female, estrogen receptor positive ([place]) 05/20/2013   Nonischemic cardiomyopathy ([place]) 07/2015   Normal coronary arteries by cath. EF was 25-30% by echo. GR2 DD - likely related to LBBB in setting of A. fib   Obesity (BMI 30-39.9) 06/11/2013   Paroxysmal atrial fibrillation ([place]): CHA2DS2-VASc Score - starting Eliquis 08/06/2015   This patients CHA2DS2-VASc Score and unadjusted Ischemic Stroke Rate (% per year) is equal to 4.8 % stroke rate/year from a score of 4  Above score calculated as 1 point each if present [CHF, HTN, DM, Vascular=MI/PAD/Aortic Plaque, Age if 65-74, or Female]; 2 points each if present [Age > 75, or Stroke/TIA/TE]   Pneumonia due to COVID-19 virus 04/05/2019   Pneumonia due to COVID-19 virus 04/04/2019   Admitted December 24-30, 2020-acute hypoxic  respite failure.   PONV (postoperative nausea and vomiting)    Pre-diabetes    S/P radiation therapy 07/18/2013-08/28/2013   1) Left Breast / 50 Gy in 25 fractions/ 2) Left Breast Boost / 10 Gy in 5 fractions   Shingles    Past Surgical History:  Procedure Laterality Date   ABDOMINAL HYSTERECTOMY     BACK SURGERY     BREAST LUMPECTOMY Left    neg   CARDIAC CATHETERIZATION N/A 08/10/2015   Procedure: Right/Left Heart Cath and Coronary Angiography;  Surgeon: Burnell Blanks, MD;  Location: [facility];  Service: Cardiovascular;: Nonobstructive CAD   CATARACT EXTRACTION BILATERAL W/ ANTERIOR VITRECTOMY Bilateral    CHOLECYSTECTOMY     COLONOSCOPY  2014   DUPUYTREN / PALMAR FASCIOTOMY Bilateral 2007   x 3 to left and once to the right    epidural injections     x 3   FOOT TENDON SURGERY Right ~ 11/2015   "stepped in a hole and tore 2 tendons"   JOINT REPLACEMENT     KNEE ARTHROSCOPY Bilateral    LUMBAR FUSION  10/2013   Dr. Lynann Bologna   TONSILLECTOMY     TOTAL KNEE ARTHROPLASTY Left 09/23/2016   TOTAL KNEE ARTHROPLASTY Left 09/23/2016   Procedure: TOTAL KNEE ARTHROPLASTY;  Surgeon: Dorna Leitz, MD;  Location: [facility];  Service: Orthopedics;  Laterality: Left;   TRANSTHORACIC ECHOCARDIOGRAM  08/07/2015  Severely reduced LVEF at 25-30% with diffuse HK. GR 2 DD. Ventricular dyssynergy secondary to LBBB. Small to moderate pericardial effusion but no hemodynamic compromise   TRANSTHORACIC ECHOCARDIOGRAM  10/2015    (EF up from 25-30%): - Left ventricle: Poor acoustic windows -  difficult to see.    EF estimated at 45-50% with inferoseptal and possible posterior hypokinesis. GR 1 DD. No pericardial effusion.   TRANSTHORACIC ECHOCARDIOGRAM  04/05/2019   EF 60 to 65% without wall motion normality.  GR 1 DD.  Mild LA dilation.  Trivial MR.  Frequent PVCs noted.   TUBAL LIGATION     UPPER GI ENDOSCOPY     Social History   Socioeconomic History   Marital status: Widowed     Spouse name: Not on file   Number of children: Not on file   Years of education: Not on file   Highest education level: Not on file  Occupational History   Occupation: Systems analyst    Employer: [employer]: Works 5 hours a day.  Very busy  Tobacco Use   Smoking status: Never   Smokeless tobacco: Never  Vaping Use   Vaping Use: Never used  Substance and Sexual Activity   Alcohol use: Yes    Comment: wine occasionally-rarely   Drug use: No   Sexual activity: Not Currently    Birth control/protection: Surgical  Other Topics Concern   Not on file  Social History Narrative   Not on file   Social Determinants of Health   Financial Resource Strain: Not on file  Food Insecurity: Not on file  Transportation Needs: Not on file  Physical Activity: Not on file  Stress: Not on file  Social Connections: Not on file   Family History  Problem Relation Age of Onset   Heart attack Mother    Stroke Mother    COPD Father    Hypertension Father    Hyperlipidemia Sister    Breast cancer Sister    Colon polyps Neg Hx    Esophageal cancer Neg Hx    Rectal cancer Neg Hx    Stomach cancer Neg Hx    Allergies  Allergen Reactions   Requip [Ropinirole Hcl] Shortness Of Breath and Nausea And Vomiting   Minocycline Hives   Tetracyclines & Related Hives   Zestril [Lisinopril] Cough   Zetia [Ezetimibe] Other (See Comments)    MYALGIA JOINT PAIN   Codeine Other (See Comments)    fatigue   Morphine Sulfate Palpitations   Sulfa Antibiotics Other (See Comments)    Headache (pt states that a blood pressure medicine caused a severe headache but doesn't remember a reaction to sulfa)   Prior to Admission medications   Medication Sig Start Date End Date Taking? Authorizing Provider  acetaminophen (TYLENOL) 500 MG tablet Take 1,000 mg by mouth every 6 (six) hours as needed for moderate pain.   Yes [provider]  apixaban (ELIQUIS) 5 MG TABS tablet Take 1  tablet (5 mg total) by mouth 2 (two) times daily. 05/05/20  Yes Leonie Man, MD  buPROPion (WELLBUTRIN XL) 150 MG 24 hr tablet TAKE 1 TABLET BY MOUTH EVERY DAY IN THE MORNING Patient taking differently: Take 150 mg by mouth daily. 10/11/18  Yes Vladimir Crofts, PA-C  Cholecalciferol (VITAMIN D3) 250 MCG (10000 UT) TABS Take 5,000 Units by mouth daily.   Yes [provider]  Coenzyme Q10 200 MG capsule Take 400 mg by mouth at bedtime.  Yes [provider]  COLACE 100 MG capsule Take 100 mg by mouth 2 (two) times daily. 04/28/20  Yes [provider]  DULoxetine (CYMBALTA) 30 MG capsule Take 30 mg by mouth 2 (two) times daily.   Yes [provider]  furosemide (LASIX) 20 MG tablet Take 1 tablet (20 mg total) by mouth 2 (two) times daily. 05/05/20 08/03/20 Yes Leonie Man, MD  gabapentin (NEURONTIN) 300 MG capsule TAKE 1 CAPSULE BY MOUTH TWICE A DAY FOR CHRONIC PAIN Patient taking differently: Take 300 mg by mouth 2 (two) times daily. 08/24/20  Yes Liane Comber, NP  ipratropium (ATROVENT) 0.03 % nasal spray Place 2 sprays into the nose 3 (three) times daily. Patient taking differently: Place 2 sprays into the nose every Monday, Tuesday, Wednesday, Thursday, and Friday. 06/28/16 04/17/19 Yes Unk Pinto, MD  ketoconazole (NIZORAL) 2 % shampoo USE AS DIRECTED ON AFFECTED AREA TWICE A WEEK Patient taking differently: Apply 1 application topically 2 (two) times a week. 08/24/20  Yes Liane Comber, NP  levothyroxine (SYNTHROID) 50 MCG tablet TAKE 1/2-1 TABLET DAILY ON EMPTY STOMACH W/ WATER FOR 30 MINS NO ANTACID/MEDS/CALCIUM/MAG. X 4 HOURS Patient taking differently: Take 50 mcg by mouth daily before breakfast. 08/24/20  Yes Liane Comber, NP  losartan (COZAAR) 100 MG tablet Take 1 tablet (100 mg total) by mouth daily. 05/05/20  Yes Leonie Man, MD  meclizine (ANTIVERT) 25 MG tablet 1/2-1 pill up to 3 times daily for motion sickness/dizziness. Caution,  may cause drowsiness. Patient taking differently: Take 25 mg by mouth 3 (three) times daily as needed for dizziness. 05/24/19  Yes Liane Comber, NP  metoprolol succinate (TOPROL-XL) 50 MG 24 hr tablet TAKE 1 TABLET BY MOUTH EVERY DAY WITH OR IMMEDIATELY FOLLOWING A MEAL Patient taking differently: Take 50 mg by mouth daily. 05/05/20  Yes Leonie Man, MD  mometasone (NASONEX) 50 MCG/ACT nasal spray Use 1-2 sprays  to each nostril 1 to 2 x /day Patient taking differently: Place 1-2 sprays into the nose 2 (two) times daily as needed (allergies). 06/22/19  Yes Unk Pinto, MD  omeprazole (PRILOSEC) 40 MG capsule Take one capsule twice daily Patient taking differently: Take 40 mg by mouth 2 (two) times daily. 08/24/20  Yes Liane Comber, NP  potassium chloride SA (KLOR-CON M20) 20 MEQ tablet TAKE ONE TABLET DAILY OR AS DIRECTED BY YOUR PROVIDER. Patient taking differently: Take 20 mEq by mouth daily. 10/29/19  Yes Leonie Man, MD  rosuvastatin (CRESTOR) 40 MG tablet TAKE 1 TABLET BY MOUTH EVERY DAY, TAKE 2 TABLETS BY MOUTH ON MONDAYS Patient taking differently: Take 40 mg by mouth daily. 05/05/20  Yes Leonie Man, MD  temazepam (RESTORIL) 30 MG capsule TAKE 1 CAPSULE BY MOUTH DAILY AT BEDTIME AS NEEDED FOR SLEEP,  AVOID TAKING DAILY DUE TO RISK OF TOLERANCE Patient taking differently: Take 30 mg by mouth at bedtime. 08/24/20  Yes Liane Comber, NP  traMADol (ULTRAM) 50 MG tablet Take 50 mg by mouth every 6 (six) hours as needed for moderate pain. 02/06/20  Yes [provider]     All other systems have been reviewed and were otherwise negative with the exception of those mentioned in the HPI and as above.  Physical Exam: Vitals:   09/24/20 0605  BP: 136/77  Pulse: 80  Resp: 18  Temp: 98 F (36.7 C)  SpO2: 97%    Body mass index is 39.46 kg/m.  General: Alert, no acute distress Cardiovascular: No pedal  edema Respiratory: No cyanosis, no use of accessory  musculature Skin: No lesions in the area of chief complaint Neurologic: Sensation intact distally Psychiatric: Patient is competent for consent with normal mood and affect Lymphatic: No axillary or cervical lymphadenopathy   Assessment/Plan: PROGRESSIVE LEFT ARM PAIN AND WEAKNESS, MRI NOTABLE FOR CORD FLATTENING AND NEUROFORAMINAL STENOSIS Plan for Procedure(s): ANTERIOR CERVICAL DECOMPRESSION FUSION CERVICAL 5- CERVICAL 6, CERVICAL 6 -  CERVICAL 7 WITH INSTRUMENTATION AND ALLOGRAFT   Norva Karvonen, MD 09/24/2020 6:46 AM

## 2020-09-24 NOTE — Transfer of Care (Signed)
Immediate Anesthesia Transfer of Care Note  Patient: [name] [name]  Procedure(s) Performed: ANTERIOR CERVICAL DECOMPRESSION FUSION CERVICAL 5- CERVICAL 6, CERVICAL 6 -  CERVICAL 7 WITH INSTRUMENTATION AND ALLOGRAFT  Patient Location: PACU  Anesthesia Type:General  Level of Consciousness: sedated  Airway & Oxygen Therapy: Patient Spontanous Breathing and Patient connected to nasal cannula oxygen  Post-op Assessment: Report given to RN and Post -op Vital signs reviewed and stable  Post vital signs: Reviewed and stable  Last Vitals:  Vitals Value Taken Time  BP 142/70 09/24/20 1029  Temp    Pulse 88 09/24/20 1030  Resp 15 09/24/20 1030  SpO2 96 % 09/24/20 1030  Vitals shown include unvalidated device data.  Last Pain:  Vitals:   09/24/20 0639  TempSrc:   PainSc: 5       Patients Stated Pain Goal: 2 (48/62/82 [id number])  Complications: No notable events documented.

## 2020-09-24 NOTE — Anesthesia Postprocedure Evaluation (Signed)
Anesthesia Post Note  Patient: [name] [name]  Procedure(s) Performed: ANTERIOR CERVICAL DECOMPRESSION FUSION CERVICAL 5- CERVICAL 6, CERVICAL 6 -  CERVICAL 7 WITH INSTRUMENTATION AND ALLOGRAFT     Patient location during evaluation: PACU Anesthesia Type: General Level of consciousness: awake and alert Pain management: pain level controlled Vital Signs Assessment: post-procedure vital signs reviewed and stable Respiratory status: spontaneous breathing, nonlabored ventilation and respiratory function stable Cardiovascular status: blood pressure returned to baseline and stable Postop Assessment: no apparent nausea or vomiting Anesthetic complications: no   No notable events documented.  Last Vitals:  Vitals:   09/24/20 1100 09/24/20 1130  BP: (!) 143/69 136/79  Pulse:    Resp: 14 12  Temp:  (!) 36.3 C  SpO2: 93% 94%    Last Pain:  Vitals:   09/24/20 1130  TempSrc:   PainSc: [place]

## 2020-09-25 ENCOUNTER — Encounter (HOSPITAL_COMMUNITY): Payer: Self-pay | Admitting: Orthopedic Surgery

## 2020-09-25 MED FILL — Thrombin (Recombinant) For Soln 20000 Unit: CUTANEOUS | Qty: 1 | Status: AC

## 2020-10-02 ENCOUNTER — Other Ambulatory Visit: Payer: Self-pay | Admitting: Adult Health

## 2020-10-15 ENCOUNTER — Other Ambulatory Visit: Payer: Self-pay

## 2020-10-15 DIAGNOSIS — Z853 Personal history of malignant neoplasm of breast: Secondary | ICD-10-CM

## 2020-10-16 ENCOUNTER — Inpatient Hospital Stay: Payer: Medicare Other | Attending: Oncology

## 2020-10-19 ENCOUNTER — Inpatient Hospital Stay: Payer: Medicare Other | Admitting: Oncology

## 2020-10-29 ENCOUNTER — Other Ambulatory Visit: Payer: Self-pay | Admitting: Adult Health

## 2020-11-09 ENCOUNTER — Other Ambulatory Visit: Payer: Self-pay | Admitting: Nurse Practitioner

## 2020-11-09 ENCOUNTER — Other Ambulatory Visit: Payer: Self-pay | Admitting: Adult Health

## 2020-11-09 DIAGNOSIS — F5101 Primary insomnia: Secondary | ICD-10-CM

## 2020-11-11 ENCOUNTER — Other Ambulatory Visit: Payer: Self-pay | Admitting: Adult Health

## 2020-11-11 DIAGNOSIS — F5101 Primary insomnia: Secondary | ICD-10-CM

## 2020-11-16 ENCOUNTER — Telehealth: Payer: Self-pay

## 2020-11-16 ENCOUNTER — Other Ambulatory Visit: Payer: Self-pay | Admitting: Adult Health

## 2020-11-16 NOTE — Progress Notes (Signed)
Future Appointments  Date Time Provider [place]  01/28/2021  3:00 PM Magda Bernheim, NP [facility] None  05/24/2021  2:30 PM Magda Bernheim, NP [facility] None    PDMP reviewed for temazepam refill request. Not filled due to too soon to fill per last 90 tabs picked up 08/25/2020 and written for max 1 tab/day.

## 2020-11-16 NOTE — Telephone Encounter (Signed)
Refill request for Temazepam.  

## 2020-11-17 ENCOUNTER — Other Ambulatory Visit: Payer: Self-pay

## 2020-11-17 MED ORDER — IPRATROPIUM BROMIDE 0.03 % NA SOLN: 2.0000 | NASAL | 3 refills | Status: DC

## 2020-11-17 NOTE — Telephone Encounter (Signed)
Patient advised.

## 2020-12-02 ENCOUNTER — Other Ambulatory Visit: Payer: Self-pay | Admitting: Adult Health

## 2020-12-02 DIAGNOSIS — F5101 Primary insomnia: Secondary | ICD-10-CM

## 2020-12-06 ENCOUNTER — Other Ambulatory Visit: Payer: Self-pay | Admitting: Adult Health

## 2021-01-14 ENCOUNTER — Other Ambulatory Visit: Payer: Self-pay

## 2021-01-14 ENCOUNTER — Encounter: Payer: Self-pay | Admitting: Nurse Practitioner

## 2021-01-14 ENCOUNTER — Ambulatory Visit (INDEPENDENT_AMBULATORY_CARE_PROVIDER_SITE_OTHER): Payer: Medicare Other | Admitting: Nurse Practitioner

## 2021-01-14 VITALS — BP 114/60 | HR 91 | Temp 97.5°F | Wt 238.6 lb

## 2021-01-14 DIAGNOSIS — H8301 Labyrinthitis, right ear: Secondary | ICD-10-CM | POA: Diagnosis not present

## 2021-01-14 DIAGNOSIS — Z23 Encounter for immunization: Secondary | ICD-10-CM

## 2021-01-14 MED ORDER — DEXAMETHASONE 0.5 MG PO TABS: ORAL_TABLET | ORAL | 0 refills | Status: DC

## 2021-01-14 MED ORDER — AZITHROMYCIN 250 MG PO TABS: ORAL_TABLET | ORAL | 1 refills | Status: DC

## 2021-01-14 NOTE — Patient Instructions (Signed)
Labyrinthitis Labyrinthitis is an infection of the inner ear. Your inner ear is made up of tubes and canals (labyrinth). These are filled with fluid. There are nerve cells in your inner ear that send hearing and balance signals to your brain. When germs get inside the tubes and canals, they harm the nerve cells that send signals to the brain. This condition often starts all of a sudden and goes away in a few weeks with treatment. If the infection harms parts of the tubes and canals, some symptoms may last for a long time. What are the causes? This condition can be caused by viruses, such as one that causes: Mononucleosis, also called mono. Measles or mumps. The flu. Herpes. This condition can also be caused by bacteria that spread from an infection in the brain or the middle ear. What increases the risk? You may be at greater risk for this condition if: You had a mouth, nose, throat, or ear infection not long ago. You drink a lot of alcohol. You smoke. You use certain drugs. You are feeling tired (fatigued). You have a lot of stress. You have allergies. What are the signs or symptoms? Symptoms of this condition often start all of a sudden. The symptoms may range from mild to very bad, and may include: Dizziness. Hearing loss. A feeling that you or the things around you are moving when they are not (vertigo). Ringing in your ear (tinnitus). A feeling like you may vomit (nausea). Vomiting. Trouble focusing your eyes. If you have symptoms that last a long time, they may include: Feeling tired. Confusion. Hearing loss. Ringing in your ear. Poor balance. A feeling that you or the things around you are moving when they are not if you move your head suddenly. How is this treated? Treatment depends on the cause. If your condition is caused by bacteria, you may need antibiotic medicine. If it is caused by a virus, it may get better on its own. No matter the cause, you may be treated  with: Medicines to: Stop dizziness. Stop the feeling that you may vomit. Reduce irritation and swelling. Get better faster. Fluids through an IV tube. These may be given at a hospital. You may need IV fluids if you have very bad vomiting or feelings like you may vomit. Physical therapy. A therapist can teach you exercises to help you get used to feeling dizzy. You may need this if you have dizziness that does not go away. Follow these instructions at home: Medicines Take over-the-counter and prescription medicines only as told by your doctor. If you were prescribed an antibiotic medicine, take it as told by your doctor. Do not stop taking it even if you start to feel better. Activity Rest as told by your doctor. Limit the things you do as told. Return to your normal activities when your doctor says that it is safe. Do not make any sudden movements until you no longer feel dizzy. Do exercises as told by your doctor. General instructions Avoid loud noises and bright lights. Do not drive until your doctor says that it is safe for you. Drink enough fluid to keep your pee (urine) pale yellow. Keep all follow-up visits. Contact a doctor if: Your symptoms do not get better with medicine. You do not get better after 2 weeks. You have a fever. Get help right away if: You keep feeling like you may vomit. You keep vomiting. You are very dizzy. Your hearing gets much worse all of a sudden. Summary  Labyrinthitis is an infection of the inner ear. This condition is often caused by a virus. Symptoms include dizziness, hearing loss, and ringing in the ears. Treatment depends on the cause. Do what your doctor tells you. This information is not intended to replace advice given to you by your health care provider. Make sure you discuss any questions you have with your health care provider. Document Revised: 05/06/2020 Document Reviewed: 05/06/2020 Elsevier Patient Education  2022 [employer].

## 2021-01-14 NOTE — Progress Notes (Signed)
Assessment and Plan:  [name] [name] was seen today for otalgia and dizziness.  Diagnoses and all orders for this visit:  Infection of right inner ear -     azithromycin (ZITHROMAX) 250 MG tablet; Take 2 tablets (500 mg) on  Day 1,  followed by 1 tablet (250 mg) once daily on Days 2 through 5. -     dexamethasone (DECADRON) 0.5 MG tablet; Take 1 tablet PO BID for 3 days, then take 1 tablet PO QD for 4 days, Pt to continue Mucinex as needed and Meclizine PRN.  If symptoms have not improved by Monday call the office.  If she develops slurring speech, weakness on one side, drooping of face she is to gp to the ER  Flu vaccine need -     Flu vaccine HIGH DOSE PF      Further disposition pending results of labs. Discussed med's effects and SE's.   Over 30 minutes of exam, counseling, chart review, and critical decision making was performed.   Future Appointments  Date Time Provider [place]  01/28/2021  3:00 PM Magda Bernheim, NP [facility] None  05/24/2021  2:30 PM Magda Bernheim, NP [facility] None    ------------------------------------------------------------------------------------------------------------------   HPI BP 114/60   Pulse 91   Temp (!) 97.5 F (36.4 C)   Wt 238 lb 9.6 oz (108.2 kg)   SpO2 94%   BMI 39.71 kg/m  75 y.o.female presents for dizziness  For approximately 1 week she began noticing dizziness, walking to one side. Dizziness is worse when lying flat and walking, is controlled if sitting perfectly still.  Right ear has started to be painful x 1 day. Had some nasal drainage but is clear.  She has used Mucinex and nasal drainage has improved.   History of vertigo, these symptoms seem different per pt.    In Apr 2017, she was discovered in pAfib [facility] 4) and started [name].  Initial heart cath had a low EF 25-330% and subsequent echo in July found EF improved at 45-50%. 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   Last visit 04/2020 with Dr. Ellyn Hack : Non Ischemic Cardiomyopathy: This seems to have improved on on her feet most recent echo.  No active heart failure symptoms.  She does have some edema but without PND orthopnea this is probably due to her venous stasis.Plan: Continue ARB and beta-blocker.  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   CHF (congestive heart failure) (HCC)    Chronic back pain    cyst sitting on L4-5;slipped disc   Depression    takes Cymbalta daily   Diverticulosis    Dysrhythmia    GERD (gastroesophageal reflux disease)    takes Omeprazole daily   Headache(784.0)    rare   History of gastric ulcer at age 36   History of pancreatitis 03/27/2008   History of ulcer disease 08/06/2015   Hyperlipidemia    Hypertension    takes Cardura nightly and Verapamil daily   Hypothyroidism    Insomnia    takes Restoril nightly   Malignant neoplasm of lower-inner quadrant of left breast in female, estrogen receptor positive ([place]) 05/20/2013   Nonischemic  cardiomyopathy ([place]) 07/2015   Normal coronary arteries by cath. EF was 25-30% by echo. GR2 DD - likely related to LBBB in setting of A. fib   Obesity (BMI 30-39.9) 06/11/2013   Paroxysmal atrial fibrillation ([place]): CHA2DS2-VASc Score - starting Eliquis 08/06/2015   This patients CHA2DS2-VASc Score and unadjusted Ischemic Stroke Rate (% per year) is equal to 4.8 % stroke rate/year from a score of 4  Above score calculated as 1 point each if present [CHF, HTN, DM, Vascular=MI/PAD/Aortic Plaque, Age if 65-74, or Female]; 2 points each if present [Age > 75, or Stroke/TIA/TE]   Pneumonia due to COVID-19 virus 04/05/2019   Pneumonia due to COVID-19 virus 04/04/2019   Admitted December 24-30, 2020-acute hypoxic respite failure.    PONV (postoperative nausea and vomiting)    Pre-diabetes    S/P radiation therapy 07/18/2013-08/28/2013   1) Left Breast / 50 Gy in 25 fractions/ 2) Left Breast Boost / 10 Gy in 5 fractions   Shingles      Allergies  Allergen Reactions   Requip [Ropinirole Hcl] Shortness Of Breath and Nausea And Vomiting   Minocycline Hives   Tetracyclines & Related Hives   Zestril [Lisinopril] Cough   Zetia [Ezetimibe] Other (See Comments)    MYALGIA JOINT PAIN   Codeine Other (See Comments)    fatigue   Morphine Sulfate Palpitations   Sulfa Antibiotics Other (See Comments)    Headache (pt states that a blood pressure medicine caused a severe headache but doesn't remember a reaction to sulfa)    Current Outpatient Medications on File Prior to Visit  Medication Sig   acetaminophen (TYLENOL) 500 MG tablet Take 1,000 mg by mouth every 6 (six) hours as needed for moderate pain.   apixaban (ELIQUIS) 5 MG TABS tablet Take 1 tablet (5 mg total) by mouth 2 (two) times daily.   buPROPion (WELLBUTRIN XL) 150 MG 24 hr tablet TAKE 1 TABLET BY MOUTH EVERY DAY IN THE MORNING (Patient taking differently: Take 150 mg by mouth daily.)   Cholecalciferol (VITAMIN D3) 250 MCG (10000 UT) TABS Take 5,000 Units by mouth daily.   COLACE 100 MG capsule Take 100 mg by mouth 2 (two) times daily.   dextromethorphan-guaiFENesin (MUCINEX DM) 30-600 MG 12hr tablet Take 1 tablet by mouth 2 (two) times daily.   DULoxetine (CYMBALTA) 30 MG capsule TAKE ONE CAPSULE 3 TIMES A DAY   gabapentin (NEURONTIN) 300 MG capsule TAKE 1 CAPSULE BY MOUTH TWICE A DAY FOR CHRONIC PAIN   ipratropium (ATROVENT) 0.03 % nasal spray PLACE 2 SPRAYS INTO THE NOSE EVERY MONDAY, TUESDAY, WEDNESDAY, THURSDAY, AND FRIDAY.   ketoconazole (NIZORAL) 2 % shampoo USE AS DIRECTED TWICE A WEEK   levothyroxine (SYNTHROID) 50 MCG tablet TAKE 1/2-1 TABLET DAILY ON EMPTY STOMACH W/ WATER FOR 30 MINS NO ANTACID/MEDS/CALCIUM/MAG. X 4 HOURS (Patient taking differently:  Take 50 mcg by mouth daily before breakfast.)   losartan (COZAAR) 100 MG tablet Take 1 tablet (100 mg total) by mouth daily.   meclizine (ANTIVERT) 25 MG tablet 1/2-1 pill up to 3 times daily for motion sickness/dizziness. Caution, may cause drowsiness. (Patient taking differently: Take 25 mg by mouth 3 (three) times daily as needed for dizziness.)   metoprolol succinate (TOPROL-XL) 50 MG 24 hr tablet TAKE 1 TABLET BY MOUTH EVERY DAY WITH OR IMMEDIATELY FOLLOWING A MEAL (Patient taking differently: Take 50 mg by mouth daily.)   mometasone (NASONEX) 50 MCG/ACT nasal spray Use 1-2 sprays  to each nostril 1 to 2  x /day (Patient taking differently: Place 1-2 sprays into the nose 2 (two) times daily as needed (allergies).)   omeprazole (PRILOSEC) 40 MG capsule Take one capsule twice daily (Patient taking differently: Take 40 mg by mouth 2 (two) times daily.)   potassium chloride SA (KLOR-CON M20) 20 MEQ tablet TAKE ONE TABLET DAILY OR AS DIRECTED BY YOUR PROVIDER. (Patient taking differently: Take 20 mEq by mouth daily.)   rosuvastatin (CRESTOR) 40 MG tablet TAKE 1 TABLET BY MOUTH EVERY DAY, TAKE 2 TABLETS BY MOUTH ON MONDAYS (Patient taking differently: Take 40 mg by mouth daily.)   temazepam (RESTORIL) 30 MG capsule TAKE 1 CAPSULE DAILY AT BEDTIME AS NEEDED FOR SLEEP, AVOID TAKING DAILY DUE TO RISK OF TOLERANCE   furosemide (LASIX) 20 MG tablet Take 1 tablet (20 mg total) by mouth 2 (two) times daily.   HYDROcodone-acetaminophen (NORCO/VICODIN) 5-325 MG tablet Take 1 tablet by mouth every 6 (six) hours as needed for moderate pain or severe pain. (Patient not taking: Reported on 01/14/2021)   methocarbamol (ROBAXIN) 500 MG tablet Take 1 tablet (500 mg total) by mouth every 6 (six) hours as needed for muscle spasms. (Patient not taking: Reported on 01/14/2021)   traMADol (ULTRAM) 50 MG tablet Take 50 mg by mouth every 6 (six) hours as needed for moderate pain. (Patient not taking: Reported on 01/14/2021)   No  current facility-administered medications on file prior to visit.    ROS: all negative except above.   Physical Exam:  BP 114/60   Pulse 91   Temp (!) 97.5 F (36.4 C)   Wt 238 lb 9.6 oz (108.2 kg)   SpO2 94%   BMI 39.71 kg/m   General Appearance: Well nourished, in no apparent distress. Eyes: PERRLA, EOMs, conjunctiva no swelling or erythema Sinuses: No Frontal/maxillary tenderness ENT/Mouth: Ext aud canals clear. Right TM bulging with erythema . No erythema, swelling, or exudate on post pharynx.  Tonsils not swollen or erythematous. Hearing normal.  Neck: Supple, thyroid normal.  Respiratory: Respiratory effort normal, BS equal bilaterally without rales, rhonchi, wheezing or stridor.  Cardio: RRR with no MRGs. Brisk peripheral pulses without edema.  Abdomen: Soft, + BS.  Non tender, no guarding, rebound, hernias, masses. Lymphatics: Non tender without lymphadenopathy.  Musculoskeletal: Full ROM, 5/5 strength, normal gait.  Skin: Warm, dry without rashes, lesions, ecchymosis.  Neuro: Cranial nerves intact. Normal muscle tone, no cerebellar symptoms. Sensation intact.  Psych: Awake and oriented X 3, normal affect, Insight and Judgment appropriate.     Magda Bernheim, NP 2:35 PM [facility]

## 2021-01-21 ENCOUNTER — Encounter: Payer: Medicare Other | Admitting: Adult Health

## 2021-01-28 ENCOUNTER — Encounter: Payer: Medicare Other | Admitting: Nurse Practitioner

## 2021-02-04 ENCOUNTER — Ambulatory Visit: Payer: Medicare Other | Admitting: Internal Medicine

## 2021-02-04 ENCOUNTER — Other Ambulatory Visit: Payer: Self-pay

## 2021-02-04 VITALS — BP 130/70 | HR 95 | Temp 97.8°F | Resp 16 | Ht 65.0 in | Wt 242.0 lb

## 2021-02-04 DIAGNOSIS — G44209 Tension-type headache, unspecified, not intractable: Secondary | ICD-10-CM

## 2021-02-04 DIAGNOSIS — R42 Dizziness and giddiness: Secondary | ICD-10-CM

## 2021-02-04 DIAGNOSIS — Z79899 Other long term (current) drug therapy: Secondary | ICD-10-CM | POA: Diagnosis not present

## 2021-02-04 MED ORDER — PROCHLORPERAZINE MALEATE 5 MG PO TABS: ORAL_TABLET | ORAL | 1 refills | Status: DC

## 2021-02-04 MED ORDER — PSEUDOEPHEDRINE HCL ER 120 MG PO TB12: ORAL_TABLET | ORAL | 3 refills | Status: DC

## 2021-02-04 MED ORDER — DEXAMETHASONE 4 MG PO TABS: ORAL_TABLET | ORAL | 0 refills | Status: DC

## 2021-02-04 NOTE — Progress Notes (Signed)
Future Appointments  Date Time Provider [place]  02/04/2021  2:30 PM Unk Pinto, MD [facility] None  02/25/2021  2:00 PM Magda Bernheim, NP [facility] None  05/24/2021  2:30 PM Demetra Shiner Townsend Roger, NP [facility] None    History of Present Illness:     Patient  very nice 75 y.o.  MWF  with HTN, HLD, Pre-Diabetes,Vitamin D Deficiency, and major Depressive Disorder  who presents with c/o  positional spinning type vertigo precipitated at her Dentist's office when she was laying hyper extended backwards with head down. Has had several more episodes associated with rapid head positional changes. Also episodes of nausea w/o emesis. Had ongoing head/sinus congestion. No fever, chill, sweats, rash or focal neuro sign or sx's.    Medications     levothyroxine (SYNTHROID) 50 MCG tablet, TAKE 1/2-1 TABLET DAILY (Patient taking differently: Take 50 mcg by mouth daily before breakfast.)    furosemide (LASIX) 20 MG tablet, Take 1 tablet (20 mg total) by mouth 2 (two) times daily.   losartan (COZAAR) 100 MG tablet, Take 1 tablet (100 mg total) by mouth daily.   metoprolol succinate (TOPROL-XL) 50 MG 24 hr tablet, TAKE 1 TABLET BY MOUTH EVERY DAY WITH OR IMMEDIATELY FOLLOWING A MEAL (Patient taking differently: Take 50 mg by mouth daily.)   rosuvastatin (CRESTOR) 40 MG tablet, TAKE 1 TABLET BY MOUTH EVERY DAY, TAKE 2 TABLETS BY MOUTH ON MONDAYS (Patient taking differently: Take 40 mg by mouth daily.)    dextromethorphan-guaiFENesin (MUCINEX DM) 30-600 MG 12hr tablet, Take 1 tablet by mouth 2 (two) times daily.   ipratropium (ATROVENT) 0.03 % nasal spray, PLACE 2 SPRAYS INTO THE NOSE EVERY MONDAY, TUESDAY, WEDNESDAY, THURSDAY, AND FRIDAY.   mometasone (NASONEX) 50 MCG/ACT nasal spray, Use 1-2 sprays  to each nostril 1 to 2 x /day (Patient taking differently: Place 1-2 sprays into the nose 2 (two) times daily as needed (allergies).)    acetaminophen (TYLENOL) 500 MG tablet, Take 1,000 mg by mouth  every 6 (six) hours as needed for moderate pain.  (Patient not taking: Reported on 01/14/2021) (Patient not taking: Reported on 01/14/2021)    apixaban (ELIQUIS) 5 MG TABS tablet, Take 1 tablet (5 mg total) by mouth 2 (two) times daily.    buPROPion (WELLBUTRIN XL) 150 MG 24 hr tablet, TAKE 1 TABLET BY MOUTH EVERY DAY IN THE MORNING (Patient taking differently: Take 150 mg by mouth daily.)   Cholecalciferol (VITAMIN D3) 250 MCG (10000 UT) TABS, Take 5,000 Units by mouth daily.   COLACE 100 MG capsule, Take 100 mg by mouth 2 (two) times daily.   DULoxetine (CYMBALTA) 30 MG capsule, TAKE ONE CAPSULE 3 TIMES A DAY   gabapentin (NEURONTIN) 300 MG capsule, TAKE 1 CAPSULE BY MOUTH TWICE A DAY FOR CHRONIC PAIN   ketoconazole (NIZORAL) 2 % shampoo, USE AS DIRECTED TWICE A WEEK   meclizine (ANTIVERT) 25 MG tablet, 1/2-1 pill up to 3 times daily for motion sickness/dizziness. Caution, may cause drowsiness. (Patient taking differently: Take 25 mg by mouth 3 (three) times daily as needed for dizziness.)   (Patient not taking: Reported on 01/14/2021)   omeprazole (PRILOSEC) 40 MG capsule, Take one capsule twice daily (Patient taking differently: Take 40 mg by mouth 2 (two) times daily.)   potassium chloride SA (KLOR-CON M20) 20 MEQ tablet, TAKE ONE TABLET DAILY OR AS DIRECTED BY YOUR PROVIDER. (Patient taking differently: Take 20 mEq by mouth daily.)   temazepam (RESTORIL) 30 MG capsule, TAKE  1 CAPSULE DAILY AT BEDTIME AS NEEDED FOR SLEEP, AVOID TAKING DAILY DUE TO RISK OF TOLERANCE  Problem list She has Hyperlipidemia, mixed; Major depressive disorder, recurrent episode, in partial remission ([place]); Essential hypertension; H/O left bundle branch block; Allergic rhinitis; GERD; Radiculopathy; Abnormal glucose; Vitamin D deficiency; Medication management; DDD (degenerative disc disease), lumbar; Encounter for Medicare annual wellness exam; Paroxysmal atrial fibrillation ([place]): CHA2DS2-VASc Score  4 ->  Eliquis;  Hypothyroidism; Severe anemia; (HFpEF) heart failure with preserved ejection fraction ([place]); Non-ischemic cardiomyopathy ([place]); Primary osteoarthritis of left knee; Severe obesity with body mass index (BMI) of 35.0 to 39.9 with comorbidity (HCC); CKD (chronic kidney disease) stage 3, GFR 30-59 ml/min ([place]); Bradycardia; History of COVID-19; 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   Observations/Objective:  BP 130/70   Pulse 95   Temp 97.8 F (36.6 C)   Resp 16   Ht 5\' 5"  (1.651 m)   Wt 242 lb (109.8 kg)   SpO2 97%   BMI 40.27 kg/m   No stridor, rash, cyanosis or icterus.   HEENT - WNL. Neck - supple.  Chest - Clear equal BS. Cor - Nl HS. RRR w/o sig MGR. PP 1(+). No edema. MS- FROM w/o deformities.  Gait Nl. Neuro -  Nl w/o focal abnormalities.  Assessment and Plan:  1. Vertigo  - CBC with Differential/Platelet - COMPLETE METABOLIC PANEL WITH GFR  2. Acute non intractable tension-type headache   3. Medication management  - CBC with Differential/Platelet - COMPLETE METABOLIC PANEL WITH GFR   Follow Up Instructions:       I discussed the assessment and treatment plan with the patient. The patient was provided an opportunity to ask questions and all were answered. The patient agreed with the plan and demonstrated an understanding of the instructions.       The patient was advised to call back or seek an in-person evaluation if the symptoms worsen or if the condition fails to improve as anticipated.   Kirtland Bouchard, MD

## 2021-02-04 NOTE — Patient Instructions (Addendum)
Vertigo Vertigo is the feeling that you or your surroundings are moving when they are not. This feeling can come and go at any time. Vertigo often goes away on its own. Vertigo can be dangerous if it occurs while you are doing something that could endanger yourself or others, such as driving or operating machinery. Your health care provider will do tests to try to determine the cause of your vertigo. Tests will also help your health care provider decide how best to treat your condition. Follow these instructions at home: Eating and drinking   Dehydration can make vertigo worse. Drink enough fluid to keep your urine pale yellow. Do not drink alcohol. Activity Return to your normal activities as told by your health care provider. Ask your health care provider what activities are safe for you. In the morning, first sit up on the side of the bed. When you feel okay, stand slowly while you hold onto something until you know that your balance is fine. Move slowly. Avoid sudden body or head movements or certain positions, as told by your health care provider. If you have trouble walking or keeping your balance, try using a cane for stability. If you feel dizzy or unstable, sit down right away. Avoid doing any tasks that would cause danger to you or others if vertigo occurs. Avoid bending down if you feel dizzy. Place items in your home so that they are easy for you to reach without bending or leaning over. Do not drive or use machinery if you feel dizzy. General instructions Take over-the-counter and prescription medicines only as told by your health care provider. Keep all follow-up visits. This is important. Contact a health care provider if: Your medicines do not relieve your vertigo or they make it worse. Your condition gets worse or you develop new symptoms. You have a fever. You develop nausea or vomiting, or if nausea gets worse. Your family or friends notice any behavioral changes. You have  numbness or a prickling and tingling sensation in part of your body. Get help right away if you: Are always dizzy or you faint. Develop severe headaches. Develop a stiff neck. Develop sensitivity to light. Have difficulty moving or speaking. Have weakness in your hands, arms, or legs. Have changes in your hearing or vision. These symptoms may represent a serious problem that is an emergency. Do not wait to see if the symptoms will go away. Get medical help right away. Call your local emergency services (911 in the [place] Do not drive yourself to the hospital. Summary Vertigo is the feeling that you or your surroundings are moving when they are not. Your health care provider will do tests to try to determine the cause of your vertigo. Follow instructions for home care. You may be told to avoid certain tasks, positions, or movements. Contact a health care provider if your medicines do not relieve your symptoms, or if you have a fever, nausea, vomiting, or changes in behavior. Get help right away if you have severe headaches or difficulty speaking, or you develop hearing or vision problems. This information is not intended to replace advice given to you by your health care provider. Make sure you discuss any questions you have with your health care provider. Document Revised: 02/26/2020 Document Reviewed: 02/26/2020 Elsevier Patient Education  2022 [employer]. Benign Positional Vertigo Vertigo is the feeling that you or your surroundings are moving when they are not. Benign positional vertigo is the most common form of vertigo. This is  usually a harmless condition (benign). This condition is positional. This means that symptoms are triggered by certain movements and positions. This condition can be dangerous if it occurs while you are doing something that could cause harm to yourself or others. This includes activities such as driving or operating machinery. What are the causes? The inner ear  has fluid-filled canals that help your brain sense movement and balance. When the fluid moves, the brain receives messages about your body's position. With benign positional vertigo, calcium crystals in the inner ear break free and disturb the inner ear area. This causes your brain to receive confusing messages about your body's position. What increases the risk? You are more likely to develop this condition if: You are a woman. You are 91 years of age or older. You have recently had a head injury. You have an inner ear disease. What are the signs or symptoms? Symptoms of this condition usually happen when you move your head or your eyes in different directions. Symptoms may start suddenly and usually last for less than a minute. They include: Loss of balance and falling. Feeling like you are spinning or moving. Feeling like your surroundings are spinning or moving. Nausea and vomiting. Blurred vision. Dizziness. Involuntary eye movement (nystagmus). Symptoms can be mild and cause only minor problems, or they can be severe and interfere with daily life. Episodes of benign positional vertigo may return (recur) over time. Symptoms may also improve over time. How is this diagnosed? This condition may be diagnosed based on: Your medical history. A physical exam of the head, neck, and ears. Positional tests to check for or stimulate vertigo. You may be asked to turn your head and change positions, such as going from sitting to lying down. A health care provider will watch for symptoms of vertigo. You may be referred to a health care provider who specializes in ear, nose, and throat problems (ENT or otolaryngologist) or a provider who specializes in disorders of the nervous system (neurologist). How is this treated? This condition may be treated in a session in which your health care provider moves your head in specific positions to help the displaced crystals in your inner ear move. Treatment for  this condition may take several sessions. Surgery may be needed in severe cases, but this is rare. In some cases, benign positional vertigo may resolve on its own in 2-4 weeks. Follow these instructions at home: Safety Move slowly. Avoid sudden body or head movements or certain positions, as told by your health care provider. Avoid driving or operating machinery until your health care provider says it is safe. Avoid doing any tasks that would be dangerous to you or others if vertigo occurs. If you have trouble walking or keeping your balance, try using a cane for stability. If you feel dizzy or unstable, sit down right away. Return to your normal activities as told by your health care provider. Ask your health care provider what activities are safe for you. General instructions Take over-the-counter and prescription medicines only as told by your health care provider. Drink enough fluid to keep your urine pale yellow. Keep all follow-up visits. This is important. Contact a health care provider if: You have a fever. Your condition gets worse or you develop new symptoms. Your family or friends notice any behavioral changes. You have nausea or vomiting that gets worse. You have numbness or a prickling and tingling sensation. Get help right away if you: Have difficulty speaking or moving. Are always  dizzy or faint. Develop severe headaches. Have weakness in your legs or arms. Have changes in your hearing or vision. Develop a stiff neck. Develop sensitivity to light. These symptoms may represent a serious problem that is an emergency. Do not wait to see if the symptoms will go away. Get medical help right away. Call your local emergency services (911 in the [place] Do not drive yourself to the hospital. Summary Vertigo is the feeling that you or your surroundings are moving when they are not. Benign positional vertigo is the most common form of vertigo. This condition is caused by calcium  crystals in the inner ear that become displaced. This causes a disturbance in an area of the inner ear that helps your brain sense movement and balance. Symptoms include loss of balance and falling, feeling that you or your surroundings are moving, nausea and vomiting, and blurred vision. This condition can be diagnosed based on symptoms, a physical exam, and positional tests. Follow safety instructions as told by your health care provider and keep all follow-up visits. This is important. This information is not intended to replace advice given to you by your health care provider. Make sure you discuss any questions you have with your health care provider. Document Revised: 02/26/2020 Document Reviewed: 02/26/2020 Elsevier Patient Education  2022 [employer].

## 2021-02-05 ENCOUNTER — Encounter: Payer: Self-pay | Admitting: Internal Medicine

## 2021-02-05 LAB — COMPLETE METABOLIC PANEL WITH GFR
AG Ratio: 1.7 (calc) (ref 1.0–2.5)
ALT: 10 U/L (ref 6–29)
AST: 12 U/L (ref 10–35)
Albumin: 3.8 g/dL (ref 3.6–5.1)
Alkaline phosphatase (APISO): 85 U/L (ref 37–153)
BUN: 21 mg/dL (ref 7–25)
CO2: 25 mmol/L (ref 20–32)
Calcium: 8.8 mg/dL (ref 8.6–10.4)
Chloride: 106 mmol/L (ref 98–110)
Creat: 0.88 mg/dL (ref 0.60–1.00)
Globulin: 2.2 g/dL (calc) (ref 1.9–3.7)
Glucose, Bld: 159 mg/dL — ABNORMAL HIGH (ref 65–99)
Potassium: 4.1 mmol/L (ref 3.5–5.3)
Sodium: 142 mmol/L (ref 135–146)
Total Bilirubin: 0.2 mg/dL (ref 0.2–1.2)
Total Protein: 6 g/dL — ABNORMAL LOW (ref 6.1–8.1)
eGFR: 69 mL/min/{1.73_m2} (ref >=60)

## 2021-02-05 LAB — CBC WITH DIFFERENTIAL/PLATELET
Absolute Monocytes: 540 cells/uL (ref 200–950)
Basophils Absolute: 73 cells/uL (ref 0–200)
Basophils Relative: 1 %
Eosinophils Absolute: 241 cells/uL (ref 15–500)
Eosinophils Relative: 3.3 %
HCT: 27.4 % — ABNORMAL LOW (ref 35.0–45.0)
Hemoglobin: 8.9 g/dL — ABNORMAL LOW (ref 11.7–15.5)
Lymphs Abs: 1445 cells/uL (ref 850–3900)
MCH: 29.4 pg (ref 27.0–33.0)
MCHC: 32.5 g/dL (ref 32.0–36.0)
MCV: 90.4 fL (ref 80.0–100.0)
MPV: 10.9 fL (ref 7.5–12.5)
Monocytes Relative: 7.4 %
Neutro Abs: 5001 cells/uL (ref 1500–7800)
Neutrophils Relative %: 68.5 %
Platelets: 283 10*3/uL (ref 140–400)
RBC: 3.03 10*6/uL — ABNORMAL LOW (ref 3.80–5.10)
RDW: 13 % (ref 11.0–15.0)
Total Lymphocyte: 19.8 %
WBC: 7.3 10*3/uL (ref 3.8–10.8)

## 2021-02-05 NOTE — Progress Notes (Signed)
============================================================ -   Test results slightly outside the reference range are not unusual. If there is anything important, I will review this with you,  otherwise it is considered normal test values.  If you have further questions,  please do not hesitate to contact me at the office or via My Chart.  ============================================================ ============================================================  - All labs OK showing No dehydration, but Mild anemia with                                   lower Hgb/ Red Cell count of 8.9 gm& as we discussed.   - Please monitor BP & pulse sitting & standing   - Increase your Metoprolol 50 mg to take 1 tablet Now, then tomorrow begin Taking 2 tablets ( = 100 mg ) every morning to hopefully prevent Afib   - Continue your iron supplement daily  and as tolerated,                                                             you may try increase dose to 2  tabs daily.  -  Veggies with Iron  are                                 Carrots, Beets ,   all leafy green veggies as                                     Spinach, Collards,  Turnip - Mustard or Mixed Greens,                                      Kale, Asparagus, Broccoli, Brussel Sprouts,                                      Green Beans / peas, Soybeans, Lentils, Sweet Potatoes

## 2021-02-24 NOTE — Progress Notes (Signed)
COMPLETE PHYSICAL  Assessment / Plan:    Essential hypertension - continue medications, DASH diet, exercise and monitor at home. Call if greater than 130/80.   -     CBC with Differential/Platelet -     CMP/GFR -     TSH - EKG  Paroxysmal atrial fibrillation [facility]): CHA2DS2-VASc Score - starting Eliquis Control blood pressure, cholesterol, glucose, increase exercise.  Continue cardio follow up  Chronic combined systolic and diastolic heart failure, NYHA class 2 ([place]) -- exacerbated by A. Fib Weight stable, appears euvoluemic, followed by cardiology EKG  Non-ischemic cardiomyopathy ([place]) Weight stable at home Optimize meds  Paroxysmal supraventricular tachycardia (HCC) Control blood pressure, cholesterol, glucose, increase exercise.  Continue cardio follow up  Non-seasonal allergic rhinitis, unspecified trigger - Allegra OTC, increase H20, allergy hygiene explained.  Gastroesophageal reflux disease without esophagitis Continue PPI/H2 blocker, diet discussed Magnesium  Other specified hypothyroidism Hypothyroidism-check TSH level, continue medications the same, reminded to take on an empty stomach 30-24mins before food.  -     TSH  Lumbar radiculopathy Follow up ortho/pain management   DDD (degenerative disc disease), lumbar Follow up ortho  Hyperlipidemia -continue medications, check lipids, decrease fatty foods, increase activity.  -     Lipid panel  H/O left bundle branch block Continue cardio follow up  ANXIETY DEPRESSION Continue meds  History of pancreatitis Monitor, no ETOH  Hx of  -  Malignant neoplasm of lower-inner quadrant of left breast in female, estrogen receptor positive  S/p lumpectomy and 5 year anastrozole, has been released Monitoring by annual mammograms   Morbid Obesity with co morbidities ([place])  - long discussion about weight loss, diet, and exercise   Prediabetes Discussed general issues about diabetes pathophysiology and  management., Educational material distributed., Suggested low cholesterol diet., Encouraged aerobic exercise., Discussed foot care., Reminded to get yearly retinal exam. -     Hemoglobin A1c  Vitamin D deficiency At goal at recent check; continue to recommend supplementation for goal of 60-100 Check Vitamin D level  History of ulcer disease Monitor, avoid NSAIDS, continue GERD meds  CKD Stage 2 GFR 60-89 Increase fluids, avoid NSAIDS, monitor sugars, will monitor Routine urine with microscopic reflex Microalbumin/ creatinine urine ratio  Anemia Chronic, stable, monitor. UTD on colonoscopy. CBC Total Iron and TIBC Ferritin  Seraccult cards sent home  Dizziness Change position slowly Monitor BP CBC, Iron total and TIBC, ferritin If labs are stable will refer to neurology for further evaluation- relates to beginning after cervical fusion   Further disposition pending results if labs check today. Discussed med's effects and SE's.   Over 40 minutes of face to face interview, exam, counseling, chart review, and critical decision making was performed.   Future Appointments  Date Time Provider [place]  05/24/2021  2:30 PM Magda Bernheim, NP [facility] None      Subjective:  [name] [name] is a 75 y.o. female who presents for complete physical.   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   She has hx of R breast cancer in 2015, s/p lumpectomy, completed 5 years of anastrozole and was released by Dr. Jana Hakim, now following with annual mammograms.   She follows with Dr. Verl Blalock (pain management) for cervical and lower back pain with radiculopathy and gets epidural injections  last one 01/2021 did improve pain slightly. She also has knee pain seen by Dr. [name].   she has a diagnosis of depression/anxiety and is currently on wellbutrin 150 mg, cymbalta 60 mg,  reports symptoms are typically well controlled with this regimen, more down in recent weeks due to several  family deaths in recent months. she also takes temazepam PRN insomnia daily, hasn't done well with attempts to taper.   she has a diagnosis of GERD with history of ulcer, which is currently managed by omeprazole 40 mg and famotidine PRN breakthrough.   She had cervical fusion 09/24/20 with Dr. Lynann Bologna and since that time has been experiencing dizziness. Dizziness occurs when going from lying down to sitting. It will sometimes resolve with sitting but sometimes it persists. Meclizine is not helping Not happening everyday. Headaches occasionally.  Denies blurred vision.   BMI is Body mass index is 39.8 kg/m., she has not been working on diet and exercise, works all day on her feet with walking and lifting at [employer].  Wt Readings from Last 3 Encounters:  02/25/21 239 lb 3.2 oz (108.5 kg)  02/04/21 242 lb (109.8 kg)  01/14/21 238 lb 9.6 oz (108.2 kg)   She has hx of a. Fib/PSVT, on elequis, non-ischemic cardiomyopathy, diastolic CHF improved on ARB/BB, most recent ECHO with EF 60-65%, followed by cardiology Dr. Ellyn Hack. Will have next follow up in 04/2021.  Had recent benign holter results in Feb 2021.   Her blood pressure has been controlled at home, today their BP is BP: 118/68 BP Readings from Last 3 Encounters:  02/25/21 118/68  02/04/21 130/70  01/14/21 114/60    She does not workout. She denies chest pain, shortness of breath, dizziness.   She is on cholesterol medication (rosuvastatin 40 mg daily) and denies myalgias. Her cholesterol is at goal. The cholesterol last visit was:   Lab Results  Component Value Date   CHOL 139 09/02/2020   HDL 51 09/02/2020   LDLCALC 64 09/02/2020   TRIG 159 (H) 09/02/2020   CHOLHDL 2.7 09/02/2020    She has not been working on diet and exercise for prediabetes, and denies foot ulcerations, increased appetite, nausea, paresthesia of the feet, polydipsia, polyuria, visual disturbances, vomiting and weight loss. Last A1C in the office was:  Lab  Results  Component Value Date   HGBA1C 6.2 (H) 09/02/2020   She has CKD III monitored at this office. Last GFR:  Lab Results  Component Value Date   GFRNONAA 60 09/02/2020   Patient is on Vitamin D supplement.   Lab Results  Component Value Date   VD25OH 83 09/02/2020       Medication Review: Current Outpatient Medications on File Prior to Visit  Medication Sig Dispense Refill   acetaminophen (TYLENOL) 500 MG tablet Take 1,000 mg by mouth every 6 (six) hours as needed for moderate pain.     apixaban (ELIQUIS) 5 MG TABS tablet Take 1 tablet (5 mg total) by mouth 2 (two) times daily. 180 tablet 3   buPROPion (WELLBUTRIN XL) 150 MG 24 hr tablet TAKE 1 TABLET BY MOUTH EVERY DAY IN THE MORNING (Patient taking differently: Take 150 mg by mouth daily.) 90 tablet 1   Cholecalciferol (VITAMIN D3) 250 MCG (10000 UT) TABS Take 5,000 Units by mouth daily.     COLACE 100 MG capsule Take 100 mg by mouth 2 (two) times daily.     DULoxetine (CYMBALTA) 30 MG capsule TAKE ONE CAPSULE 3 TIMES A DAY 270 capsule 3   gabapentin (NEURONTIN) 300 MG capsule TAKE 1 CAPSULE BY MOUTH TWICE A DAY FOR CHRONIC PAIN 180 capsule 1   ipratropium (ATROVENT) 0.03 % nasal spray PLACE 2 SPRAYS  INTO THE NOSE EVERY MONDAY, TUESDAY, WEDNESDAY, [place], AND FRIDAY. 30 mL 1   ketoconazole (NIZORAL) 2 % shampoo USE AS DIRECTED TWICE A WEEK 120 mL 0   levothyroxine (SYNTHROID) 50 MCG tablet TAKE 1/2-1 TABLET DAILY ON EMPTY STOMACH W/ WATER FOR 30 MINS NO ANTACID/MEDS/CALCIUM/MAG. X 4 HOURS (Patient taking differently: Take 50 mcg by mouth daily before breakfast.) 90 tablet 3   losartan (COZAAR) 100 MG tablet Take 1 tablet (100 mg total) by mouth daily. 90 tablet 3   meclizine (ANTIVERT) 25 MG tablet 1/2-1 pill up to 3 times daily for motion sickness/dizziness. Caution, may cause drowsiness. (Patient taking differently: Take 25 mg by mouth 3 (three) times daily as needed for dizziness.) 60 tablet 0   methocarbamol (ROBAXIN) 500  MG tablet Take 1 tablet (500 mg total) by mouth every 6 (six) hours as needed for muscle spasms. 60 tablet 0   metoprolol succinate (TOPROL-XL) 50 MG 24 hr tablet TAKE 1 TABLET BY MOUTH EVERY DAY WITH OR IMMEDIATELY FOLLOWING A MEAL (Patient taking differently: Take 50 mg by mouth daily.) 90 tablet 3   mometasone (NASONEX) 50 MCG/ACT nasal spray Use 1-2 sprays  to each nostril 1 to 2 x /day (Patient taking differently: Place 1-2 sprays into the nose 2 (two) times daily as needed (allergies).) 51 g 3   omeprazole (PRILOSEC) 40 MG capsule Take one capsule twice daily (Patient taking differently: Take 40 mg by mouth 2 (two) times daily.) 180 capsule 3   potassium chloride SA (KLOR-CON M20) 20 MEQ tablet TAKE ONE TABLET DAILY OR AS DIRECTED BY YOUR PROVIDER. (Patient taking differently: Take 20 mEq by mouth daily.) 90 tablet 1   prochlorperazine (COMPAZINE) 5 MG tablet Take 1/2 to 1 tablet  3 x /day to prevent Vertigo & Nausea 50 tablet 1   pseudoephedrine (SUDAFED) 120 MG 12 hr tablet Take  1 tablet  2 x /day (every 12 hours)  for Head, Chest Congestion & Ear Fluid 60 tablet 3   rosuvastatin (CRESTOR) 40 MG tablet TAKE 1 TABLET BY MOUTH EVERY DAY, TAKE 2 TABLETS BY MOUTH ON MONDAYS (Patient taking differently: Take 40 mg by mouth daily.) 102 tablet 2   temazepam (RESTORIL) 30 MG capsule TAKE 1 CAPSULE DAILY AT BEDTIME AS NEEDED FOR SLEEP, AVOID TAKING DAILY DUE TO RISK OF TOLERANCE 90 capsule 0   furosemide (LASIX) 20 MG tablet Take 1 tablet (20 mg total) by mouth 2 (two) times daily. 180 tablet 3   No current facility-administered medications on file prior to visit.    Allergies  Allergen Reactions   Requip [Ropinirole Hcl] Shortness Of Breath and Nausea And Vomiting   Minocycline Hives   Tetracyclines & Related Hives   Zestril [Lisinopril] Cough   Zetia [Ezetimibe] Other (See Comments)    MYALGIA JOINT PAIN   Codeine Other (See Comments)    fatigue   Morphine Sulfate Palpitations   Sulfa  Antibiotics Other (See Comments)    Headache (pt states that a blood pressure medicine caused a severe headache but doesn't remember a reaction to sulfa)    Current Problems (verified) Patient Active Problem List   Diagnosis Date Noted   Chronic combined systolic and diastolic heart failure ([place]) 09/03/2020   History of breast cancer 09/06/2019   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 04/30/2019   Bradycardia    History of COVID-19    CKD (chronic kidney disease) stage 3, GFR 30-59 ml/min (HCC) 09/21/2018   Severe obesity with body mass index (BMI) of  35.0 to 39.9 with comorbidity ([place]) 06/14/2017   Primary osteoarthritis of left knee 09/23/2016   Non-ischemic cardiomyopathy ([place])    (HFpEF) heart failure with preserved ejection fraction ([place]) 08/08/2015   Paroxysmal atrial fibrillation ([place]): CHA2DS2-VASc Score  4 ->  Eliquis 08/06/2015   Hypothyroidism 08/06/2015   Severe anemia    Encounter for Medicare annual wellness exam 03/31/2015   DDD (degenerative disc disease), lumbar 06/16/2014   Abnormal glucose 12/18/2013   Vitamin D deficiency 12/18/2013   Medication management 12/18/2013   Radiculopathy 10/16/2013   Hyperlipidemia, mixed 03/13/2008   Major depressive disorder, recurrent episode, in partial remission ([place]) 03/13/2008   Essential hypertension 03/13/2008   H/O left bundle branch block 03/13/2008   Allergic rhinitis 03/13/2008   GERD 03/13/2008    Screening Tests Health Maintenance  Topic Date Due   Zoster Vaccines- Shingrix (1 of 2) Never done   COVID-19 Vaccine (4 - Booster for [place] series) 03/27/2020   MAMMOGRAM  07/02/2022   COLONOSCOPY (Pts 45-26yrs Insurance coverage will need to be confirmed)  03/20/2023   TETANUS/TDAP  09/23/2028   Pneumonia Vaccine 19+ Years old  Completed   INFLUENZA VACCINE  Completed   DEXA SCAN  Completed   Hepatitis C Screening  Completed   HPV VACCINES  Aged Out    Immunization History  Administered Date(s) Administered   Fluad  Quad(high Dose 65+) 01/15/2020   Influenza Whole 01/01/2013   Influenza, High Dose Seasonal PF 01/29/2014, 12/30/2014, 01/17/2019, 01/14/2021   Influenza,inj,Quad PF,6+ Mos 01/18/2018   Influenza-Unspecified 12/11/2015, 02/04/2017, 01/18/2018   PFIZER(Purple Top)SARS-COV-2 Vaccination 05/18/2019, 06/10/2019   Pneumococcal Conjugate-13 06/16/2014   Pneumococcal Polysaccharide-23 05/23/2011   Td 09/24/2018   Tdap 03/11/2008   Zoster, Live 02/02/2010   Preventative care: Last colonoscopy: 2014 refuses another Last mammogram: 07/2020 (05/06/2013 + right breast cancer s/p lumpectomy) Last pap smear/pelvic exam: remote  over 20 years ago DEXA: 05/2018 - L forearm T -1.4, otherwise in normal range Renal US 08/2015 Echo 2020 Cath 2017   Prior vaccinations: TD or Tdap: 2020 Influenza: 01/14/2021 Pneumococcal: 2013 Prevnar 13: 2016 Shingles/Zostavax: 2011, insurance won't cover at this time, declines shingrix  Covid 19: 2/2, 2021, pfizer   Names of Other Physician/Practitioners you currently use: 1. [facility] here for primary care 2. Dr Nicki Reaper, eye doctor, last visit 2021 3. [facility], dentist, last visit 2022, q 6 months  Patient Care Team: Unk Pinto, MD as PCP - General (Internal Medicine) Leonie Man, MD as PCP - Cardiology (Cardiology) Magrinat, Virgie Dad, MD as Consulting Physician (Oncology) Leonie Man, MD as Consulting Physician (Cardiology) Dorna Leitz, MD as Consulting Physician (Orthopedic Surgery) Rolm Bookbinder, MD as Consulting Physician (General Surgery) Eppie Gibson, MD as Attending Physician (Radiation Oncology)  SURGICAL HISTORY She  has a past surgical history that includes Cataract extraction bilateral w/ anterior vitrectomy (Bilateral); Abdominal hysterectomy; Cholecystectomy; Knee arthroscopy (Bilateral); Tonsillectomy; Colonoscopy (2014); Upper gi endoscopy; Dupuytren / palmar  fasciotomy (Bilateral, 2007); Tubal ligation; epidural injections; Lumbar fusion (10/2013); Cardiac catheterization (N/A, 08/10/2015); transthoracic echocardiogram (08/07/2015); transthoracic echocardiogram (10/2015); Joint replacement; Total knee arthroplasty (Left, 09/23/2016); Breast lumpectomy (Left); Foot Tendon Surgery (Right, ~ 11/2015); Total knee arthroplasty (Left, 09/23/2016); transthoracic echocardiogram (04/05/2019); Back surgery; and Anterior cervical decomp/discectomy fusion (N/A, 09/24/2020). FAMILY HISTORY Her family history includes Breast cancer in her sister; COPD in her father; Heart attack in her mother; Hyperlipidemia in her sister; Hypertension in her father; Stroke in her mother. SOCIAL HISTORY She  reports that she has never smoked. She has never used smokeless tobacco. She reports current alcohol use. She reports that she does not use drugs.      Review of Systems  Constitutional:  Negative for malaise/fatigue and weight loss.  HENT:  Negative for hearing loss and tinnitus.   Eyes:  Negative for blurred vision and double vision.  Respiratory:  Negative for cough, sputum production, shortness of breath and wheezing.   Cardiovascular:  Negative for chest pain, palpitations, orthopnea, claudication, leg swelling and PND.  Gastrointestinal:  Negative for abdominal pain, blood in stool, constipation, diarrhea, heartburn, melena, nausea and vomiting.  Genitourinary: Negative.  Negative for dysuria.  Musculoskeletal:  Negative for falls, joint pain and myalgias.  Skin:  Negative for rash.  Neurological:  Positive for dizziness. Negative for tingling, sensory change, weakness and headaches.  Endo/Heme/Allergies:  Negative for polydipsia.  Psychiatric/Behavioral: Negative.  Negative for depression, memory loss, substance abuse and suicidal ideas. The patient is not nervous/anxious and does not have insomnia.   All other systems reviewed and are negative.   Objective:      Today's Vitals   02/25/21 1409  BP: 118/68  Pulse: 81  Resp: 16  Temp: 97.9 F (36.6 C)  SpO2: 98%  Weight: 239 lb 3.2 oz (108.5 kg)  Height: 5\' 5"  (1.651 m)   Body mass index is 39.8 kg/m.  General appearance: alert, no distress, WD/WN, female HEENT: normocephalic, sclerae anicteric, TMs pearly, nares patent, no discharge or erythema, pharynx normal Oral cavity: MMM, no lesions Neck: supple, no lymphadenopathy, no thyromegaly, no masses Heart: RRR, normal S1, S2, no murmurs  Lungs: CTA bilaterally, no wheezes, rhonchi, or rales Abdomen: +bs, soft, non tender, non distended, no masses, no hepatomegaly, no splenomegaly Musculoskeletal: nontender, no swelling, no obvious deformity Extremities: no edema, no cyanosis, no clubbing Pulses: 2+ symmetric, upper and lower extremities, normal cap refill Neurological: alert, oriented x 3, CN2-12 intact, strength normal upper extremities and lower extremities, sensation normal throughout, DTRs 2+ throughout, no cerebellar signs, gait normal Psychiatric: normal affect, behavior normal, pleasant    EKG: LBBB   [facility]  02/25/2021

## 2021-02-25 ENCOUNTER — Encounter: Payer: Self-pay | Admitting: Nurse Practitioner

## 2021-02-25 ENCOUNTER — Other Ambulatory Visit: Payer: Self-pay

## 2021-02-25 ENCOUNTER — Ambulatory Visit (INDEPENDENT_AMBULATORY_CARE_PROVIDER_SITE_OTHER): Payer: Medicare Other | Admitting: Nurse Practitioner

## 2021-02-25 VITALS — BP 118/68 | HR 81 | Temp 97.9°F | Resp 16 | Ht 65.0 in | Wt 239.2 lb

## 2021-02-25 DIAGNOSIS — I471 Supraventricular tachycardia: Secondary | ICD-10-CM | POA: Diagnosis not present

## 2021-02-25 DIAGNOSIS — R42 Dizziness and giddiness: Secondary | ICD-10-CM

## 2021-02-25 DIAGNOSIS — I428 Other cardiomyopathies: Secondary | ICD-10-CM

## 2021-02-25 DIAGNOSIS — N182 Chronic kidney disease, stage 2 (mild): Secondary | ICD-10-CM

## 2021-02-25 DIAGNOSIS — I5042 Chronic combined systolic (congestive) and diastolic (congestive) heart failure: Secondary | ICD-10-CM

## 2021-02-25 DIAGNOSIS — E782 Mixed hyperlipidemia: Secondary | ICD-10-CM

## 2021-02-25 DIAGNOSIS — F3341 Major depressive disorder, recurrent, in partial remission: Secondary | ICD-10-CM

## 2021-02-25 DIAGNOSIS — I480 Paroxysmal atrial fibrillation: Secondary | ICD-10-CM

## 2021-02-25 DIAGNOSIS — E039 Hypothyroidism, unspecified: Secondary | ICD-10-CM

## 2021-02-25 DIAGNOSIS — I10 Essential (primary) hypertension: Secondary | ICD-10-CM

## 2021-02-25 DIAGNOSIS — D649 Anemia, unspecified: Secondary | ICD-10-CM

## 2021-02-25 DIAGNOSIS — Z8719 Personal history of other diseases of the digestive system: Secondary | ICD-10-CM

## 2021-02-25 DIAGNOSIS — R7309 Other abnormal glucose: Secondary | ICD-10-CM

## 2021-02-25 DIAGNOSIS — Z0000 Encounter for general adult medical examination without abnormal findings: Secondary | ICD-10-CM

## 2021-02-25 DIAGNOSIS — Z853 Personal history of malignant neoplasm of breast: Secondary | ICD-10-CM

## 2021-02-25 DIAGNOSIS — Z136 Encounter for screening for cardiovascular disorders: Secondary | ICD-10-CM

## 2021-02-25 DIAGNOSIS — Z0001 Encounter for general adult medical examination with abnormal findings: Secondary | ICD-10-CM

## 2021-02-25 DIAGNOSIS — K219 Gastro-esophageal reflux disease without esophagitis: Secondary | ICD-10-CM

## 2021-02-25 DIAGNOSIS — E559 Vitamin D deficiency, unspecified: Secondary | ICD-10-CM

## 2021-02-25 NOTE — Patient Instructions (Signed)
Dizziness Dizziness is a common problem. It is a feeling of unsteadiness or light-headedness. You may feel like you are about to faint. Dizziness can lead to injury if you stumble or fall. Anyone can become dizzy, but dizziness is more common in older adults. This condition can be caused by a number of things, including medicines, dehydration, or illness. Follow these instructions at home: Eating and drinking  Drink enough fluid to keep your urine pale yellow. This helps to keep you from becoming dehydrated. Try to drink more clear fluids, such as water. Do not drink alcohol. Limit your caffeine intake if told to do so by your health care provider. Check ingredients and nutrition facts to see if a food or beverage contains caffeine. Limit your salt (sodium) intake if told to do so by your health care provider. Check ingredients and nutrition facts to see if a food or beverage contains sodium. Activity  Avoid making quick movements. Rise slowly from chairs and steady yourself until you feel okay. In the morning, first sit up on the side of the bed. When you feel okay, stand slowly while you hold onto something until you know that your balance is good. If you need to stand in one place for a long time, move your legs often. Tighten and relax the muscles in your legs while you are standing. Do not drive or use machinery if you feel dizzy. Avoid bending down if you feel dizzy. Place items in your home so that they are easy for you to reach without leaning over. Lifestyle Do not use any products that contain nicotine or tobacco. These products include cigarettes, chewing tobacco, and vaping devices, such as e-cigarettes. If you need help quitting, ask your health care provider. Try to reduce your stress level by using methods such as yoga or meditation. Talk with your health care provider if you need help to manage your stress. General instructions Watch your dizziness for any changes. Take  over-the-counter and prescription medicines only as told by your health care provider. Talk with your health care provider if you think that your dizziness is caused by a medicine that you are taking. Tell a friend or a family member that you are feeling dizzy. If he or she notices any changes in your behavior, have this person call your health care provider. Keep all follow-up visits. This is important. Contact a health care provider if: Your dizziness does not go away or you have new symptoms. Your dizziness or light-headedness gets worse. You feel nauseous. You have reduced hearing. You have a fever. You have neck pain or a stiff neck. Your dizziness leads to an injury or a fall. Get help right away if: You vomit or have diarrhea and are unable to eat or drink anything. You have problems talking, walking, swallowing, or using your arms, hands, or legs. You feel generally weak. You have any bleeding. You are not thinking clearly or you have trouble forming sentences. It may take a friend or family member to notice this. You have chest pain, abdominal pain, shortness of breath, or sweating. Your vision changes or you develop a severe headache. These symptoms may represent a serious problem that is an emergency. Do not wait to see if the symptoms will go away. Get medical help right away. Call your local emergency services (911 in the [place] Do not drive yourself to the hospital. Summary Dizziness is a feeling of unsteadiness or light-headedness. This condition can be caused by a number of   things, including medicines, dehydration, or illness. Anyone can become dizzy, but dizziness is more common in older adults. Drink enough fluid to keep your urine pale yellow. Do not drink alcohol. Avoid making quick movements if you feel dizzy. Monitor your dizziness for any changes. This information is not intended to replace advice given to you by your health care provider. Make sure you discuss any  questions you have with your health care provider. Document Revised: 03/02/2020 Document Reviewed: 03/02/2020 Elsevier Patient Education  2022 [employer]  

## 2021-02-26 LAB — LIPID PANEL
Cholesterol: 156 mg/dL (ref <200)
HDL: 53 mg/dL (ref >=50)
LDL Cholesterol (Calc): 76 mg/dL (calc)
Non-HDL Cholesterol (Calc): 103 mg/dL (calc) (ref <130)
Total CHOL/HDL Ratio: 2.9 (calc) (ref <5.0)
Triglycerides: 168 mg/dL — ABNORMAL HIGH (ref <150)

## 2021-02-26 LAB — COMPLETE METABOLIC PANEL WITH GFR
AG Ratio: 1.5 (calc) (ref 1.0–2.5)
ALT: 12 U/L (ref 6–29)
AST: 13 U/L (ref 10–35)
Albumin: 3.8 g/dL (ref 3.6–5.1)
Alkaline phosphatase (APISO): 88 U/L (ref 37–153)
BUN/Creatinine Ratio: 17 (calc) (ref 6–22)
BUN: 20 mg/dL (ref 7–25)
CO2: 23 mmol/L (ref 20–32)
Calcium: 9.3 mg/dL (ref 8.6–10.4)
Chloride: 107 mmol/L (ref 98–110)
Creat: 1.21 mg/dL — ABNORMAL HIGH (ref 0.60–1.00)
Globulin: 2.5 g/dL (calc) (ref 1.9–3.7)
Glucose, Bld: 115 mg/dL — ABNORMAL HIGH (ref 65–99)
Potassium: 4 mmol/L (ref 3.5–5.3)
Sodium: 143 mmol/L (ref 135–146)
Total Bilirubin: 0.2 mg/dL (ref 0.2–1.2)
Total Protein: 6.3 g/dL (ref 6.1–8.1)
eGFR: 47 mL/min/{1.73_m2} — ABNORMAL LOW (ref >=60)

## 2021-02-26 LAB — MICROALBUMIN / CREATININE URINE RATIO
Creatinine, Urine: 137 mg/dL (ref 20–275)
Microalb, Ur: <0.2 mg/dL

## 2021-02-26 LAB — FERRITIN: Ferritin: 16 ng/mL (ref 16–288)

## 2021-02-26 LAB — HEMOGLOBIN A1C
Hgb A1c MFr Bld: 5.9 % of total Hgb — ABNORMAL HIGH (ref <5.7)
Mean Plasma Glucose: 123 mg/dL
eAG (mmol/L): 6.8 mmol/L

## 2021-02-26 LAB — URINALYSIS, ROUTINE W REFLEX MICROSCOPIC
Bilirubin Urine: NEGATIVE
Glucose, UA: NEGATIVE
Hgb urine dipstick: NEGATIVE
Ketones, ur: NEGATIVE
Leukocytes,Ua: NEGATIVE
Nitrite: NEGATIVE
Protein, ur: NEGATIVE
Specific Gravity, Urine: 1.019 (ref 1.001–1.035)
pH: <=5 (ref 5.0–8.0)

## 2021-02-26 LAB — CBC WITH DIFFERENTIAL/PLATELET
Absolute Monocytes: 695 cells/uL (ref 200–950)
Basophils Absolute: 79 cells/uL (ref 0–200)
Basophils Relative: 0.9 %
Eosinophils Absolute: 158 cells/uL (ref 15–500)
Eosinophils Relative: 1.8 %
HCT: 29.5 % — ABNORMAL LOW (ref 35.0–45.0)
Hemoglobin: 9.3 g/dL — ABNORMAL LOW (ref 11.7–15.5)
Lymphs Abs: 1558 cells/uL (ref 850–3900)
MCH: 28.4 pg (ref 27.0–33.0)
MCHC: 31.5 g/dL — ABNORMAL LOW (ref 32.0–36.0)
MCV: 90.2 fL (ref 80.0–100.0)
MPV: 10.9 fL (ref 7.5–12.5)
Monocytes Relative: 7.9 %
Neutro Abs: 6310 cells/uL (ref 1500–7800)
Neutrophils Relative %: 71.7 %
Platelets: 303 10*3/uL (ref 140–400)
RBC: 3.27 10*6/uL — ABNORMAL LOW (ref 3.80–5.10)
RDW: 12.8 % (ref 11.0–15.0)
Total Lymphocyte: 17.7 %
WBC: 8.8 10*3/uL (ref 3.8–10.8)

## 2021-02-26 LAB — VITAMIN D 25 HYDROXY (VIT D DEFICIENCY, FRACTURES): Vit D, 25-Hydroxy: 51 ng/mL (ref 30–100)

## 2021-02-26 LAB — IRON, TOTAL/TOTAL IRON BINDING CAP
%SAT: 5 % (calc) — ABNORMAL LOW (ref 16–45)
Iron: 23 ug/dL — ABNORMAL LOW (ref 45–160)
TIBC: 442 mcg/dL (calc) (ref 250–450)

## 2021-02-26 LAB — MAGNESIUM: Magnesium: 1.9 mg/dL (ref 1.5–2.5)

## 2021-02-26 LAB — TSH: TSH: 1.55 mIU/L (ref 0.40–4.50)

## 2021-03-01 ENCOUNTER — Other Ambulatory Visit: Payer: Self-pay | Admitting: Nurse Practitioner

## 2021-03-01 DIAGNOSIS — F5101 Primary insomnia: Secondary | ICD-10-CM

## 2021-03-06 ENCOUNTER — Other Ambulatory Visit: Payer: Self-pay | Admitting: Cardiology

## 2021-03-16 ENCOUNTER — Ambulatory Visit (INDEPENDENT_AMBULATORY_CARE_PROVIDER_SITE_OTHER): Payer: Medicare Other | Admitting: Nurse Practitioner

## 2021-03-16 ENCOUNTER — Encounter: Payer: Self-pay | Admitting: Nurse Practitioner

## 2021-03-16 ENCOUNTER — Other Ambulatory Visit: Payer: Self-pay

## 2021-03-16 VITALS — HR 90 | Temp 97.7°F

## 2021-03-16 DIAGNOSIS — Z1152 Encounter for screening for COVID-19: Secondary | ICD-10-CM | POA: Diagnosis not present

## 2021-03-16 DIAGNOSIS — U071 COVID-19: Secondary | ICD-10-CM

## 2021-03-16 DIAGNOSIS — R6889 Other general symptoms and signs: Secondary | ICD-10-CM

## 2021-03-16 LAB — POCT INFLUENZA A/B
Influenza A, POC: NEGATIVE
Influenza B, POC: NEGATIVE

## 2021-03-16 LAB — POC COVID19 BINAXNOW: SARS Coronavirus 2 Ag: POSITIVE — AB

## 2021-03-16 MED ORDER — DEXAMETHASONE 1 MG PO TABS: ORAL_TABLET | ORAL | 0 refills | Status: DC

## 2021-03-16 MED ORDER — MOLNUPIRAVIR EUA 200MG CAPSULE: 4.0000 | ORAL_CAPSULE | Freq: Two times a day (BID) | ORAL | 0 refills | Status: AC

## 2021-03-16 MED ORDER — PROMETHAZINE-DM 6.25-15 MG/5ML PO SYRP
5.0000 mL | ORAL_SOLUTION | Freq: Four times a day (QID) | ORAL | 1 refills | Status: DC | PRN
Start: 2021-03-16 — End: 2021-06-01

## 2021-03-16 MED ORDER — AZITHROMYCIN 250 MG PO TABS: ORAL_TABLET | ORAL | 1 refills | Status: DC

## 2021-03-16 NOTE — Progress Notes (Signed)
THIS ENCOUNTER IS A VIRTUAL VISIT DUE TO COVID-19 - PATIENT WAS NOT SEEN IN THE OFFICE.  PATIENT HAS CONSENTED TO VIRTUAL VISIT / TELEMEDICINE VISIT   Virtual Visit via telephone Note  I connected with  [name] on 03/16/2021 by telephone.  I verified that I am speaking with the correct person using two identifiers.    I discussed the limitations of evaluation and management by telemedicine and the availability of in person appointments. The patient expressed understanding and agreed to proceed.  History of Present Illness:  Pulse 90   Temp 97.7 F (36.5 C)   SpO2 93%  75 y.o. patient contacted office reporting URI sx Cough/congestion/myalgias/fever. she tested positive by test at office. OV was conducted by telephone to minimize exposure. This patient was vaccinated for covid 19, last 02/01/2020 Booster Pfizer  Sx began 2 days ago with Chills/fever, cough productive of green/yellow mucus, congestion, Headaches, sore throat, difficulty sleeping due to cough.  Treatments tried so far: Tylenol  Exposures: None   Medications  Current Outpatient Medications (Endocrine & Metabolic):    levothyroxine (SYNTHROID) 50 MCG tablet, TAKE 1/2-1 TABLET DAILY ON EMPTY STOMACH W/ WATER FOR 30 MINS NO ANTACID/MEDS/CALCIUM/MAG. X 4 HOURS (Patient taking differently: Take 50 mcg by mouth daily before breakfast.)  Current Outpatient Medications (Cardiovascular):    furosemide (LASIX) 20 MG tablet, TAKE 1 TABLET BY MOUTH TWICE A DAY   losartan (COZAAR) 100 MG tablet, Take 1 tablet (100 mg total) by mouth daily.   metoprolol succinate (TOPROL-XL) 50 MG 24 hr tablet, TAKE 1 TABLET BY MOUTH EVERY DAY WITH OR IMMEDIATELY FOLLOWING A MEAL (Patient taking differently: Take 50 mg by mouth daily.)   rosuvastatin (CRESTOR) 40 MG tablet, TAKE 1 TABLET BY MOUTH EVERY DAY, TAKE 2 TABLETS BY MOUTH ON MONDAYS (Patient taking differently: Take 40 mg by mouth daily.)  Current Outpatient Medications  (Respiratory):    ipratropium (ATROVENT) 0.03 % nasal spray, PLACE 2 SPRAYS INTO THE NOSE EVERY MONDAY, TUESDAY, WEDNESDAY, THURSDAY, AND FRIDAY.   mometasone (NASONEX) 50 MCG/ACT nasal spray, Use 1-2 sprays  to each nostril 1 to 2 x /day (Patient taking differently: Place 1-2 sprays into the nose 2 (two) times daily as needed (allergies).)   pseudoephedrine (SUDAFED) 120 MG 12 hr tablet, Take  1 tablet  2 x /day (every 12 hours)  for Head, Chest Congestion & Ear Fluid  Current Outpatient Medications (Analgesics):    acetaminophen (TYLENOL) 500 MG tablet, Take 1,000 mg by mouth every 6 (six) hours as needed for moderate pain.  Current Outpatient Medications (Hematological):    apixaban (ELIQUIS) 5 MG TABS tablet, Take 1 tablet (5 mg total) by mouth 2 (two) times daily.  Current Outpatient Medications (Other):    buPROPion (WELLBUTRIN XL) 150 MG 24 hr tablet, TAKE 1 TABLET BY MOUTH EVERY DAY IN THE MORNING (Patient taking differently: Take 150 mg by mouth daily.)   Cholecalciferol (VITAMIN D3) 250 MCG (10000 UT) TABS, Take 5,000 Units by mouth daily.   COLACE 100 MG capsule, Take 100 mg by mouth 2 (two) times daily.   DULoxetine (CYMBALTA) 30 MG capsule, TAKE ONE CAPSULE 3 TIMES A DAY   gabapentin (NEURONTIN) 300 MG capsule, TAKE 1 CAPSULE BY MOUTH TWICE A DAY FOR CHRONIC PAIN   ketoconazole (NIZORAL) 2 % shampoo, USE AS DIRECTED TWICE A WEEK   meclizine (ANTIVERT) 25 MG tablet, 1/2-1 pill up to 3 times daily for motion sickness/dizziness. Caution, may cause drowsiness. (Patient taking differently: Take  25 mg by mouth 3 (three) times daily as needed for dizziness.)   methocarbamol (ROBAXIN) 500 MG tablet, Take 1 tablet (500 mg total) by mouth every 6 (six) hours as needed for muscle spasms.   omeprazole (PRILOSEC) 40 MG capsule, Take one capsule twice daily (Patient taking differently: Take 40 mg by mouth 2 (two) times daily.)   potassium chloride SA (KLOR-CON M20) 20 MEQ tablet, TAKE ONE TABLET  DAILY OR AS DIRECTED BY YOUR PROVIDER. (Patient taking differently: Take 20 mEq by mouth daily.)   prochlorperazine (COMPAZINE) 5 MG tablet, Take 1/2 to 1 tablet  3 x /day to prevent Vertigo & Nausea   temazepam (RESTORIL) 30 MG capsule, TAKE 1 CAPSULE DAILY AT BEDTIME AS NEEDED FOR SLEEP, AVOID TAKING DAILY DUE TO RISK OF TOLERANCE  Allergies:  Allergies  Allergen Reactions   Requip [Ropinirole Hcl] Shortness Of Breath and Nausea And Vomiting   Minocycline Hives   Tetracyclines & Related Hives   Zestril [Lisinopril] Cough   Zetia [Ezetimibe] Other (See Comments)    MYALGIA JOINT PAIN   Codeine Other (See Comments)    fatigue   Morphine Sulfate Palpitations   Sulfa Antibiotics Other (See Comments)    Headache (pt states that a blood pressure medicine caused a severe headache but doesn't remember a reaction to sulfa)    Problem list She has Hyperlipidemia, mixed; Major depressive disorder, recurrent episode, in partial remission ([place]); Essential hypertension; H/O left bundle branch block; Allergic rhinitis; GERD; Radiculopathy; Abnormal glucose; Vitamin D deficiency; Medication management; DDD (degenerative disc disease), lumbar; Encounter for Medicare annual wellness exam; Paroxysmal atrial fibrillation ([place]): CHA2DS2-VASc Score  4 ->  Eliquis; Hypothyroidism; Severe anemia; (HFpEF) heart failure with preserved ejection fraction ([place]); Non-ischemic cardiomyopathy ([place]); Primary osteoarthritis of left knee; Severe obesity with body mass index (BMI) of 35.0 to 39.9 with comorbidity (HCC); CKD (chronic kidney disease) stage 3, GFR 30-59 ml/min ([place]); Bradycardia; History of COVID-19; 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) on their problem list.   Social History:   reports that she has never smoked. She has never used smokeless tobacco. She reports current alcohol use. She reports that she does not use  drugs.  Observations/Objective:  General : Well sounding patient in no apparent distress HEENT: no hoarseness, no cough for duration of visit Lungs: speaks in complete sentences, no audible wheezing, no apparent distress Neurological: alert, oriented x 3 Psychiatric: pleasant, judgement appropriate   Assessment and Plan:  Covid 59 Duchess was seen today for acute visit.  Diagnoses and all orders for this visit:  Flu-like symptoms -     POCT Influenza A/B  Encounter for screening for COVID-19 -     POC COVID-19  COVID-19 -     molnupiravir EUA (LAGEVRIO) 200 mg CAPS capsule; Take 4 capsules (800 mg total) by mouth 2 (two) times daily for 5 days. -     dexamethasone (DECADRON) 1 MG tablet; Take 3 tabs for 3 days, 2 tabs for 3 days 1 tab for 5 days. Take with food. -     azithromycin (ZITHROMAX) 250 MG tablet; Take 2 tablets (500 mg) on  Day 1,  followed by 1 tablet (250 mg) once daily on Days 2 through 5. -     promethazine-dextromethorphan (PROMETHAZINE-DM) 6.25-15 MG/5ML syrup; Take 5 mLs by mouth 4 (four) times daily as needed for cough.   Covid 19 positive per rapid screening test in parking lot of office  Risk factors include: heart failure, atrial fibrillation, CKD, Abnormal glucose, Obesity, HX of Covid Symptoms are: moderate Due to co morbid conditions and risk factors, discussed antivirals . Will start Molnupiravir Immue support reviewed  Take tylenol PRN temp 101+ Push hydration Regular ambulation or calf exercises exercises for clot prevention and 81 mg ASA unless contraindicated Sx supportive therapy suggested Follow up via mychart or telephone if needed Advised patient obtain O2 monitor; present to ED if persistently <90% or with severe dyspnea, CP, fever uncontrolled by tylenol, confusion, sudden decline Should remain in isolation until at least 5 days from onset of sx, 24-48 hours fever free without tylenol, sx such as cough are improved.      Follow Up  Instructions:  I discussed the assessment and treatment plan with the patient. The patient was provided an opportunity to ask questions and all were answered. The patient agreed with the plan and demonstrated an understanding of the instructions.   The patient was advised to call back or seek an in-person evaluation if the symptoms worsen or if the condition fails to improve as anticipated.  I provided 20 minutes of non-face-to-face time during this encounter.   Magda Bernheim, NP

## 2021-03-22 ENCOUNTER — Other Ambulatory Visit: Payer: Self-pay

## 2021-03-22 DIAGNOSIS — Z1212 Encounter for screening for malignant neoplasm of rectum: Secondary | ICD-10-CM

## 2021-03-22 DIAGNOSIS — Z1211 Encounter for screening for malignant neoplasm of colon: Secondary | ICD-10-CM

## 2021-03-22 LAB — POC HEMOCCULT BLD/STL (HOME/3-CARD/SCREEN)
Card #2 Fecal Occult Blod, POC: NEGATIVE
Card #3 Fecal Occult Blood, POC: NEGATIVE
Fecal Occult Blood, POC: NEGATIVE

## 2021-03-28 ENCOUNTER — Other Ambulatory Visit: Payer: Self-pay | Admitting: Internal Medicine

## 2021-03-29 ENCOUNTER — Ambulatory Visit: Payer: Medicare Other | Admitting: Podiatry

## 2021-04-08 ENCOUNTER — Ambulatory Visit (INDEPENDENT_AMBULATORY_CARE_PROVIDER_SITE_OTHER): Payer: Medicare Other | Admitting: Podiatry

## 2021-04-08 ENCOUNTER — Other Ambulatory Visit: Payer: Self-pay

## 2021-04-08 ENCOUNTER — Encounter: Payer: Self-pay | Admitting: Podiatry

## 2021-04-08 DIAGNOSIS — L600 Ingrowing nail: Secondary | ICD-10-CM | POA: Diagnosis not present

## 2021-04-08 NOTE — Patient Instructions (Signed)

## 2021-04-08 NOTE — Progress Notes (Signed)
Subjective:   Patient ID: [name] [name], female   DOB: 75 y.o.   MRN: [id number]   HPI Patient presents with severe damage to the right big toenail second nail right stating they have been sore she is tried to trim them [place] without relief of symptoms would like them removed   ROS      Objective:  Physical Exam  Neurovascular status intact with damaged painful right hallux nail and right second nail.  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  Explained procedure risk and patient wants surgery understanding risk and today I allowed her to sign consent form.  I infiltrated the hallux and second toe 60 mg like Marcaine mixture sterile prep done and using sterile instrumentation remove the hallux and second nailbeds exposed matrix and applied phenol 5 applications hallux for application second toe followed by alcohol lavage and sterile dressing.  Gave instructions on soaks and reappoint

## 2021-04-27 ENCOUNTER — Ambulatory Visit: Payer: Medicare Other | Admitting: Adult Health Nurse Practitioner

## 2021-05-10 ENCOUNTER — Other Ambulatory Visit: Payer: Self-pay | Admitting: Internal Medicine

## 2021-05-24 ENCOUNTER — Ambulatory Visit: Payer: Medicare Other | Admitting: Nurse Practitioner

## 2021-05-27 ENCOUNTER — Other Ambulatory Visit: Payer: Self-pay | Admitting: Cardiology

## 2021-05-31 ENCOUNTER — Other Ambulatory Visit: Payer: Self-pay | Admitting: Internal Medicine

## 2021-05-31 ENCOUNTER — Other Ambulatory Visit: Payer: Self-pay | Admitting: Nurse Practitioner

## 2021-05-31 DIAGNOSIS — F5101 Primary insomnia: Secondary | ICD-10-CM

## 2021-05-31 DIAGNOSIS — Z1231 Encounter for screening mammogram for malignant neoplasm of breast: Secondary | ICD-10-CM

## 2021-05-31 NOTE — Progress Notes (Signed)
MEDICARE ANNUAL WELLNESS VISIT AND FOLLOW UP  Assessment:   Encounter for annual medicare wellness visit Yearly  Essential hypertension Continue current medications:Metoprolol 50mg  daily, Lasix 20mg  daily with potassium 75mEq Decrease Losartan to 1/2 tab 100mg  as BP is running low and patient has been fatigued Monitor blood pressure at home; call if consistently over 130/80 Continue DASH diet.   Reminder to go to the ER if any CP, SOB, nausea, dizziness, severe HA, changes vision/speech, left arm numbness and tingling and jaw pain. -     CBC with Differential/Platelet -     CMP/GFR -     TSH  Paroxysmal atrial fibrillation [facility]): CHA2DS2-VASc Score - starting Eliquis Control blood pressure, cholesterol, glucose, increase exercise.  Continue cardio follow up  Chronic combined systolic and diastolic heart failure, NYHA class 2 ([place]) -- exacerbated by A. Fib Weight stable, appears euvoluemic, followed by cardiology  Non-ischemic cardiomyopathy ([place]) Weight stable at home Continue to monitor  Paroxysmal supraventricular tachycardia ([place]) Control blood pressure, cholesterol, glucose, increase exercise.  Continue cardio follow up  Non-seasonal allergic rhinitis, unspecified trigger - Allegra OTC, increase H20, allergy hygiene explained.   ANXIETY DEPRESSION Continue medications: bupropion XL 150 daily, cymbalta 30mg  BID Discussed stress management techniques  Discussed, increase water,intake & good sleep hygiene  Discussed increasing exercise & vegetables in diet   Gastroesophageal reflux disease without esophagitis Continue PPI/H2 blocker, diet discussed  CKD3a Increase fluids  Avoid NSAIDS Blood pressure control Monitor sugars  Will continue to monitor  Other specified hypothyroidism Taking levothyroxine 50 mcg daily Reminder to take on an empty stomach 30-42mins before first meal of the day. No antacid medications for 4 hours. -     TSH  Lumbar  radiculopathy Follow up ortho/pain management PRN  DDD (degenerative disc disease), lumbar Follow up ortho PRN  Hyperlipidemia -continue medications, check lipids, decrease fatty foods, increase activity.  -     Lipid panel  H/O left bundle branch block Continue cardio follow up  History of pancreatitis Monitor, no ETOH  Hx of  -  Malignant neoplasm of lower-inner quadrant of left breast in female, estrogen receptor positive  S/p lumpectomy and 5 year anastrozole, has been released Monitoring by annual mammograms   Morbid Obesity with co morbidities ([place])  - long discussion about weight loss, diet, and exercise  - Begin Ozempic  Prediabetes Discussed general issues about diabetes pathophysiology and management., Educational material distributed., Suggested low cholesterol diet., Encouraged aerobic exercise., Discussed foot care., Reminded to get yearly retinal exam. Begin Ozempic 0.25mg  once a week, if not controlling appetite increase to 0.5mg  weekly -     Hemoglobin A1c  Vitamin D deficiency At goal at recent check; continue to recommend supplementation for goal of 60-100 Defer vitamin D level  History of ulcer disease Monitor, avoid NSAIDS, continue GERD meds   Anemia Chronic, stable, monitor; recent iron was normal, stop supplement, get on B12 sublingual which was low. UTD on colonoscopy.    Over 40 minutes of face to face interview, exam, counseling, chart review and critical decision making was performed Future Appointments  Date Time Provider [place]  07/02/2021  1:30 PM GI-[facility] MM 3 GI-BCG[facility]  08/16/2021  3:20 PM Leonie Man, MD [facility]  02/28/2022  2:00 PM Magda Bernheim, NP GAAM-[facility] None  06/01/2022  3:00 PM Magda Bernheim, NP [facility] None     Plan:   During the course of the visit the patient was educated and  counseled about appropriate screening and preventive services including:   Pneumococcal vaccine  Prevnar  13 Influenza vaccine Td vaccine Screening electrocardiogram Bone densitometry screening Colorectal cancer screening Diabetes screening Glaucoma screening Nutrition counseling  Advanced directives: requested   Subjective:  [name] [name] is a 76 y.o. female who presents for Medicare Annual Wellness Visit and 3 month follow up.     She had covid 19 with resp failure requiring admission in 12/24-12/30/20, with significant slow recovering fatigue, significantly improved.   Continues to work in [employer]. Overall doing well.  She has hx of R breast cancer in 2015, s/p lumpectomy, completed 5 years of anastrozole and was released by Dr. Jana Hakim, now following with annual mammograms.   She follows with Dr. Verl Blalock Dr. Lynann Bologna (pain management) for mid back pain with radiculopathy and gets epidural injections though reports hasn't been recently. She also has knee pain seen by Dr. [name].   she has a diagnosis of depression/anxiety and is currently on wellbutrin 150 mg, cymbalta 30 mg bid, reports symptoms are typically well controlled with this regimen.. she also takes temazepam PRN insomnia daily, hasn't done well with attempts to taper.   she has a diagnosis of GERD with history of ulcer, which is currently managed by omeprazole 40 mg and famotidine PRN breakthrough.  she reports symptoms is currently well controlled, and denies breakthrough reflux, burning in chest, hoarseness or cough.    BMI is Body mass index is 39.74 kg/m., she has not been working on diet and exercise, works all day on her feet with walking and lifting at [employer].  Wt Readings from Last 3 Encounters:  06/01/21 238 lb 12.8 oz (108.3 kg)  02/25/21 239 lb 3.2 oz (108.5 kg)  02/04/21 242 lb (109.8 kg)   She has hx of a. Fib/PSVT, on elequis, non-ischemic cardiomyopathy, diastolic CHF improved on ARB/BB, most recent ECHO with EF 60-65%, followed by cardiology Dr. Ellyn Hack.  Had recent benign holter results in Feb  2021.   Her blood pressure has been controlled at home, today their BP is BP: (!) 118/58  BP Readings from Last 3 Encounters:  06/01/21 (!) 118/58  02/25/21 118/68  02/04/21 130/70    She does not workout. She denies chest pain, shortness of breath, dizziness.   She is on cholesterol medication (rosuvastatin 40 mg daily) and denies myalgias. Her cholesterol is at goal. The cholesterol last visit was:   Lab Results  Component Value Date   CHOL 156 02/25/2021   HDL 53 02/25/2021   LDLCALC 76 02/25/2021   TRIG 168 (H) 02/25/2021   CHOLHDL 2.9 02/25/2021    She has not been working on diet and exercise for prediabetes, and denies foot ulcerations, increased appetite, nausea, paresthesia of the feet, polydipsia, polyuria, visual disturbances, vomiting and weight loss. Last A1C in the office was:  Lab Results  Component Value Date   HGBA1C 5.9 (H) 02/25/2021   She has CKD III monitored at this office. Last GFR:  Lab Results  Component Value Date   GFRNONAA 60 09/02/2020   Patient is on Vitamin D supplement.   Lab Results  Component Value Date   VD25OH 51 02/25/2021       Medication Review: Current Outpatient Medications on File Prior to Visit  Medication Sig Dispense Refill   acetaminophen (TYLENOL) 500 MG tablet Take 1,000 mg by mouth every 6 (six) hours as needed for moderate pain.     apixaban (ELIQUIS) 5 MG TABS tablet Take 1 tablet (5  mg total) by mouth 2 (two) times daily. 180 tablet 3   buPROPion (WELLBUTRIN XL) 150 MG 24 hr tablet TAKE 1 TABLET BY MOUTH EVERY DAY IN THE MORNING (Patient taking differently: Take 150 mg by mouth daily.) 90 tablet 1   Cholecalciferol (VITAMIN D3) 250 MCG (10000 UT) TABS Take 5,000 Units by mouth daily.     COLACE 100 MG capsule Take 100 mg by mouth 2 (two) times daily.     DULoxetine (CYMBALTA) 30 MG capsule TAKE ONE CAPSULE 3 TIMES A DAY 270 capsule 3   furosemide (LASIX) 20 MG tablet TAKE 1 TABLET BY MOUTH TWICE A DAY 180 tablet 1    gabapentin (NEURONTIN) 300 MG capsule TAKE 1 CAPSULE BY MOUTH TWICE A DAY FOR CHRONIC PAIN 180 capsule 1   ipratropium (ATROVENT) 0.03 % nasal spray PLACE 2 SPRAYS INTO THE NOSE EVERY MONDAY, TUESDAY, WEDNESDAY, THURSDAY, AND FRIDAY. 4 mL 11   ketoconazole (NIZORAL) 2 % shampoo USE AS DIRECTED TWICE A WEEK 120 mL 0   levothyroxine (SYNTHROID) 50 MCG tablet TAKE 1/2-1 TABLET DAILY ON EMPTY STOMACH W/ WATER FOR 30 MINS NO ANTACID/MEDS/CALCIUM/MAG. X 4 HOURS (Patient taking differently: Take 50 mcg by mouth daily before breakfast.) 90 tablet 3   losartan (COZAAR) 100 MG tablet Take 1 tablet (100 mg total) by mouth daily. 90 tablet 3   meclizine (ANTIVERT) 25 MG tablet 1/2-1 pill up to 3 times daily for motion sickness/dizziness. Caution, may cause drowsiness. (Patient taking differently: Take 25 mg by mouth 3 (three) times daily as needed for dizziness.) 60 tablet 0   methocarbamol (ROBAXIN) 500 MG tablet Take 1 tablet (500 mg total) by mouth every 6 (six) hours as needed for muscle spasms. 60 tablet 0   metoprolol succinate (TOPROL-XL) 50 MG 24 hr tablet Take 1 tablet (50 mg total) by mouth daily. 90 tablet 1   mometasone (NASONEX) 50 MCG/ACT nasal spray Use 1-2 sprays  to each nostril 1 to 2 x /day (Patient taking differently: Place 1-2 sprays into the nose 2 (two) times daily as needed (allergies).) 51 g 3   omeprazole (PRILOSEC) 40 MG capsule Take one capsule twice daily (Patient taking differently: Take 40 mg by mouth 2 (two) times daily.) 180 capsule 3   potassium chloride SA (KLOR-CON M20) 20 MEQ tablet TAKE ONE TABLET DAILY OR AS DIRECTED BY YOUR PROVIDER. (Patient taking differently: Take 20 mEq by mouth daily.) 90 tablet 1   prochlorperazine (COMPAZINE) 5 MG tablet TAKE 1/2 TO 1 TABLET 3 TIMES A DAY TO PREVENT VERTIGO & NAUSEA 50 tablet 1   rosuvastatin (CRESTOR) 40 MG tablet TAKE 1 TABLET BY MOUTH EVERY DAY, TAKE 2 TABLETS BY MOUTH ON MONDAYS 102 tablet 2   temazepam (RESTORIL) 30 MG capsule  TAKE 1 CAPSULE DAILY AT BEDTIME AS NEEDED FOR SLEEP, AVOID TAKING DAILY DUE TO RISK OF TOLERANCE 90 capsule 0   No current facility-administered medications on file prior to visit.    Allergies  Allergen Reactions   Requip [Ropinirole Hcl] Shortness Of Breath and Nausea And Vomiting   Minocycline Hives   Tetracyclines & Related Hives   Zestril [Lisinopril] Cough   Zetia [Ezetimibe] Other (See Comments)    MYALGIA JOINT PAIN   Codeine Other (See Comments)    fatigue   Morphine Sulfate Palpitations   Sulfa Antibiotics Other (See Comments)    Headache (pt states that a blood pressure medicine caused a severe headache but doesn't remember a reaction to sulfa)  Current Problems (verified) Patient Active Problem List   Diagnosis Date Noted   Chronic combined systolic and diastolic heart failure ([place]) 09/03/2020   History of breast cancer 09/06/2019   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 04/30/2019   Bradycardia    History of COVID-19    CKD (chronic kidney disease) stage 3, GFR 30-59 ml/min (H[facility]) 09/21/2018   Severe obesity with body mass index (BMI) of 35.0 to 39.9 with comorbidity ([place]) 06/14/2017   Primary osteoarthritis of left knee 09/23/2016   Non-ischemic cardiomyopathy ([place])    (HFpEF) heart failure with preserved ejection fraction ([place]) 08/08/2015   Paroxysmal atrial fibrillation ([place]): CHA2DS2-VASc Score  4 ->  Eliquis 08/06/2015   Hypothyroidism 08/06/2015   Severe anemia    Encounter for Medicare annual wellness exam 03/31/2015   DDD (degenerative disc disease), lumbar 06/16/2014   Abnormal glucose 12/18/2013   Vitamin D deficiency 12/18/2013   Medication management 12/18/2013   Radiculopathy 10/16/2013   Hyperlipidemia, mixed 03/13/2008   Major depressive disorder, recurrent episode, in partial remission ([place]) 03/13/2008   Essential hypertension 03/13/2008   H/O left bundle branch block 03/13/2008   Allergic rhinitis 03/13/2008   GERD 03/13/2008    Screening  Tests Health Maintenance  Topic Date Due   Zoster Vaccines- Shingrix (1 of 2) 08/29/2021 (Originally 03/31/1965)   COLONOSCOPY (Pts 45-57yrs Insurance coverage will need to be confirmed)  03/20/2023   TETANUS/TDAP  09/23/2028   Pneumonia Vaccine 14+ Years old  Completed   INFLUENZA VACCINE  Completed   DEXA SCAN  Completed   COVID-19 Vaccine  Completed   Hepatitis C Screening  Completed   HPV VACCINES  Aged Out    Immunization History  Administered Date(s) Administered   Fluad Quad(high Dose 65+) 01/15/2020   Influenza Whole 01/01/2013   Influenza, High Dose Seasonal PF 01/29/2014, 12/30/2014, 01/17/2019, 01/14/2021   Influenza,inj,Quad PF,6+ Mos 01/18/2018   Influenza-Unspecified 12/11/2015, 02/04/2017, 01/18/2018   PFIZER(Purple Top)SARS-COV-2 Vaccination 05/18/2019, 06/10/2019   Pneumococcal Conjugate-13 06/16/2014   Pneumococcal Polysaccharide-23 05/23/2011   Td 09/24/2018   Tdap 03/11/2008   Zoster, Live 02/02/2010   Preventative care: Last colonoscopy: 2014 due 2024 Last mammogram: Scheduled 07/02/21 (05/06/2013 + right breast cancer s/p lumpectomy) Last pap smear/pelvic exam: remote   DEXA: 05/2018 - L forearm T -1.4, otherwise in normal range Renal US 08/2015 Echo 2020 Cath 2017  Prior vaccinations: TD or Tdap: 2020 Influenza: 2020 Pneumococcal: 2013 Prevnar 13: 2016 Shingles/Zostavax: 2011, insurance won't cover at this time, declines shingrix  Covid 19: 2/2, 2021, pfizer  Names of Other Physician/Practitioners you currently use: 1. [facility] here for primary care 2. Dr Nicki Reaper, eye doctor, last visit 2021, encouraged to schedule 3. [facility], dentist, last visit 02/2021  Patient Care Team: Unk Pinto, MD as PCP - General (Internal Medicine) Leonie Man, MD as PCP - Cardiology (Cardiology) Magrinat, Virgie Dad, MD (Inactive) as Consulting Physician (Oncology) Leonie Man, MD as Consulting  Physician (Cardiology) Dorna Leitz, MD as Consulting Physician (Orthopedic Surgery) Rolm Bookbinder, MD as Consulting Physician (General Surgery) Eppie Gibson, MD as Attending Physician (Radiation Oncology)  SURGICAL HISTORY She  has a past surgical history that includes Cataract extraction bilateral w/ anterior vitrectomy (Bilateral); Abdominal hysterectomy; Cholecystectomy; Knee arthroscopy (Bilateral); Tonsillectomy; Colonoscopy (2014); Upper gi endoscopy; Dupuytren / palmar fasciotomy (Bilateral, 2007); Tubal ligation; epidural injections; Lumbar fusion (10/2013); Cardiac catheterization (N/A, 08/10/2015); transthoracic echocardiogram (08/07/2015); transthoracic echocardiogram (10/2015); Joint replacement; Total knee arthroplasty (Left, 09/23/2016); Breast lumpectomy (Left); Foot Tendon  Surgery (Right, ~ 11/2015); Total knee arthroplasty (Left, 09/23/2016); transthoracic echocardiogram (04/05/2019); Back surgery; and Anterior cervical decomp/discectomy fusion (N/A, 09/24/2020). FAMILY HISTORY Her family history includes Breast cancer in her sister; COPD in her father; Heart attack in her mother; Hyperlipidemia in her sister; Hypertension in her father; Stroke in her mother. SOCIAL HISTORY She  reports that she has never smoked. She has never used smokeless tobacco. She reports current alcohol use. She reports that she does not use drugs.   MEDICARE WELLNESS OBJECTIVES: Physical activity: Current Exercise Habits: Home exercise routine, Type of exercise: walking, Time (Minutes): 30, Frequency (Times/Week): 7, Weekly Exercise (Minutes/Week): 210, Exercise limited by: orthopedic condition(s) Cardiac risk factors: Cardiac Risk Factors include: advanced age (>50men, >72 women);dyslipidemia;hypertension;sedentary lifestyle;obesity (BMI >30kg/m2) Depression/mood screen:   Depression screen [facility] 2/9 06/01/2021  Decreased Interest 0  Down, Depressed, Hopeless 0  PHQ - 2 Score 0  Altered sleeping -   Tired, decreased energy -  Change in appetite -  Feeling bad or failure about yourself  -  Trouble concentrating -  Moving slowly or fidgety/restless -  Suicidal thoughts -  PHQ-9 Score -  Difficult doing work/chores -  Some recent data might be hidden    ADLs:  In your present state of health, do you have any difficulty performing the following activities: 06/01/2021 09/17/2020  Hearing? N N  Vision? N N  Difficulty concentrating or making decisions? N N  Walking or climbing stairs? N N  Dressing or bathing? N N  Doing errands, shopping? N N  Some recent data might be hidden     Cognitive Testing  Alert? Yes  Normal Appearance?Yes  Oriented to person? Yes  Place? Yes   Time? Yes  Recall of three objects?  Yes  Can perform simple calculations? Yes  Displays appropriate judgment?Yes  Can read the correct time from a watch face?Yes  EOL planning: Does Patient Have a Medical Advance Directive?: Yes Type of Advance Directive: Living will, Healthcare Power of Attorney Does patient want to make changes to medical advance directive?: No - Patient declined Copy of [place] in Chart?: No - copy requested  Review of Systems  Constitutional:  Positive for malaise/fatigue. Negative for weight loss.  HENT:  Negative for hearing loss and tinnitus.   Eyes:  Negative for blurred vision and double vision.  Respiratory:  Negative for cough, sputum production, shortness of breath and wheezing.   Cardiovascular:  Negative for chest pain, palpitations, orthopnea, claudication, leg swelling and PND.  Gastrointestinal:  Positive for constipation. Negative for abdominal pain, blood in stool, diarrhea, heartburn, melena, nausea and vomiting.  Genitourinary: Negative.   Musculoskeletal:  Positive for back pain and joint pain. Negative for falls and myalgias.  Skin:  Negative for rash.  Neurological:  Negative for dizziness, tingling, sensory change, weakness and headaches.   Endo/Heme/Allergies:  Negative for polydipsia.  Psychiatric/Behavioral:  Positive for depression. Negative for memory loss, substance abuse and suicidal ideas. The patient is not nervous/anxious and does not have insomnia.   All other systems reviewed and are negative.   Objective:     Today's Vitals   06/01/21 1427  BP: (!) 118/58  Pulse: 83  Temp: 97.6 F (36.4 C)  SpO2: 96%  Weight: 238 lb 12.8 oz (108.3 kg)    Body mass index is 39.74 kg/m.  General appearance: alert, no distress, WD/WN, female HEENT: normocephalic, sclerae anicteric, TMs pearly, nares patent, no discharge or erythema, pharynx normal Oral cavity: MMM, no  lesions Neck: supple, no lymphadenopathy, no thyromegaly, no masses Heart: RRR, normal S1, S2, no murmurs  Lungs: CTA bilaterally, no wheezes, rhonchi, or rales Abdomen: +bs, soft, non tender, non distended, no masses, no hepatomegaly, no splenomegaly Musculoskeletal: nontender, no swelling, no obvious deformity Extremities: no edema, no cyanosis, no clubbing Pulses: 2+ symmetric, upper and lower extremities, normal cap refill Neurological: alert, oriented x 3, CN2-12 intact, strength normal upper extremities and lower extremities, sensation normal throughout, DTRs 2+ throughout, no cerebellar signs, gait normal Psychiatric: normal affect, behavior normal, pleasant   Medicare Attestation I have personally reviewed: The patient's medical and social history Their use of alcohol, tobacco or illicit drugs Their current medications and supplements The patient's functional ability including ADLs,fall risks, home safety risks, cognitive, and hearing and visual impairment Diet and physical activities Evidence for depression or mood disorders  The patient's weight, height, BMI, and visual acuity have been recorded in the chart.  I have made referrals, counseling, and provided education to the patient based on review of the above and I have provided the patient  with a written personalized care plan for preventive services.     Magda Bernheim, NP   06/01/2021

## 2021-06-01 ENCOUNTER — Encounter: Payer: Self-pay | Admitting: Nurse Practitioner

## 2021-06-01 ENCOUNTER — Other Ambulatory Visit: Payer: Self-pay | Admitting: Internal Medicine

## 2021-06-01 ENCOUNTER — Other Ambulatory Visit: Payer: Self-pay

## 2021-06-01 ENCOUNTER — Ambulatory Visit: Payer: Medicare Other | Admitting: Nurse Practitioner

## 2021-06-01 VITALS — BP 118/58 | HR 83 | Temp 97.6°F | Wt 238.8 lb

## 2021-06-01 DIAGNOSIS — E782 Mixed hyperlipidemia: Secondary | ICD-10-CM

## 2021-06-01 DIAGNOSIS — F3341 Major depressive disorder, recurrent, in partial remission: Secondary | ICD-10-CM

## 2021-06-01 DIAGNOSIS — I471 Supraventricular tachycardia, unspecified: Secondary | ICD-10-CM

## 2021-06-01 DIAGNOSIS — I5042 Chronic combined systolic (congestive) and diastolic (congestive) heart failure: Secondary | ICD-10-CM | POA: Diagnosis not present

## 2021-06-01 DIAGNOSIS — Z0001 Encounter for general adult medical examination with abnormal findings: Secondary | ICD-10-CM

## 2021-06-01 DIAGNOSIS — Z780 Asymptomatic menopausal state: Secondary | ICD-10-CM

## 2021-06-01 DIAGNOSIS — N1831 Chronic kidney disease, stage 3a: Secondary | ICD-10-CM

## 2021-06-01 DIAGNOSIS — M5136 Other intervertebral disc degeneration, lumbar region: Secondary | ICD-10-CM

## 2021-06-01 DIAGNOSIS — M51369 Other intervertebral disc degeneration, lumbar region without mention of lumbar back pain or lower extremity pain: Secondary | ICD-10-CM

## 2021-06-01 DIAGNOSIS — K219 Gastro-esophageal reflux disease without esophagitis: Secondary | ICD-10-CM

## 2021-06-01 DIAGNOSIS — Z0000 Encounter for general adult medical examination without abnormal findings: Secondary | ICD-10-CM

## 2021-06-01 DIAGNOSIS — I480 Paroxysmal atrial fibrillation: Secondary | ICD-10-CM

## 2021-06-01 DIAGNOSIS — E039 Hypothyroidism, unspecified: Secondary | ICD-10-CM

## 2021-06-01 DIAGNOSIS — I10 Essential (primary) hypertension: Secondary | ICD-10-CM

## 2021-06-01 DIAGNOSIS — R6889 Other general symptoms and signs: Secondary | ICD-10-CM | POA: Diagnosis not present

## 2021-06-01 DIAGNOSIS — I428 Other cardiomyopathies: Secondary | ICD-10-CM | POA: Diagnosis not present

## 2021-06-01 DIAGNOSIS — R7309 Other abnormal glucose: Secondary | ICD-10-CM

## 2021-06-01 DIAGNOSIS — M8580 Other specified disorders of bone density and structure, unspecified site: Secondary | ICD-10-CM

## 2021-06-01 LAB — COMPLETE METABOLIC PANEL WITH GFR
AG Ratio: 1.6 (calc) (ref 1.0–2.5)
ALT: 11 U/L (ref 6–29)
AST: 12 U/L (ref 10–35)
Albumin: 4.1 g/dL (ref 3.6–5.1)
Alkaline phosphatase (APISO): 88 U/L (ref 37–153)
BUN/Creatinine Ratio: 17 (calc) (ref 6–22)
BUN: 22 mg/dL (ref 7–25)
CO2: 26 mmol/L (ref 20–32)
Calcium: 9.5 mg/dL (ref 8.6–10.4)
Chloride: 105 mmol/L (ref 98–110)
Creat: 1.32 mg/dL — ABNORMAL HIGH (ref 0.60–1.00)
Globulin: 2.5 g/dL (calc) (ref 1.9–3.7)
Glucose, Bld: 86 mg/dL (ref 65–99)
Potassium: 4.3 mmol/L (ref 3.5–5.3)
Sodium: 141 mmol/L (ref 135–146)
Total Bilirubin: 0.3 mg/dL (ref 0.2–1.2)
Total Protein: 6.6 g/dL (ref 6.1–8.1)
eGFR: 42 mL/min/{1.73_m2} — ABNORMAL LOW (ref >=60)

## 2021-06-01 LAB — LIPID PANEL
Cholesterol: 144 mg/dL (ref <200)
HDL: 51 mg/dL (ref >=50)
LDL Cholesterol (Calc): 68 mg/dL (calc)
Non-HDL Cholesterol (Calc): 93 mg/dL (calc) (ref <130)
Total CHOL/HDL Ratio: 2.8 (calc) (ref <5.0)
Triglycerides: 170 mg/dL — ABNORMAL HIGH (ref <150)

## 2021-06-01 LAB — CBC WITH DIFFERENTIAL/PLATELET
Absolute Monocytes: 877 cells/uL (ref 200–950)
Basophils Absolute: 112 cells/uL (ref 0–200)
Basophils Relative: 1.3 %
Eosinophils Absolute: 241 cells/uL (ref 15–500)
Eosinophils Relative: 2.8 %
HCT: 29.9 % — ABNORMAL LOW (ref 35.0–45.0)
Hemoglobin: 9.5 g/dL — ABNORMAL LOW (ref 11.7–15.5)
Lymphs Abs: 1608 cells/uL (ref 850–3900)
MCH: 27.9 pg (ref 27.0–33.0)
MCHC: 31.8 g/dL — ABNORMAL LOW (ref 32.0–36.0)
MCV: 87.9 fL (ref 80.0–100.0)
MPV: 11.1 fL (ref 7.5–12.5)
Monocytes Relative: 10.2 %
Neutro Abs: 5762 cells/uL (ref 1500–7800)
Neutrophils Relative %: 67 %
Platelets: 307 10*3/uL (ref 140–400)
RBC: 3.4 10*6/uL — ABNORMAL LOW (ref 3.80–5.10)
RDW: 14.3 % (ref 11.0–15.0)
Total Lymphocyte: 18.7 %
WBC: 8.6 10*3/uL (ref 3.8–10.8)

## 2021-06-01 LAB — MAGNESIUM: Magnesium: 2 mg/dL (ref 1.5–2.5)

## 2021-06-01 MED ORDER — OZEMPIC (0.25 OR 0.5 MG/DOSE) 2 MG/1.5ML ~~LOC~~ SOPN: 0.5000 mg | PEN_INJECTOR | SUBCUTANEOUS | 2 refills | Status: DC

## 2021-06-01 NOTE — Patient Instructions (Addendum)
Cut Losartan dose in half Take BP daily if consistently running more than 130/80 please send me a message  Ozempic start at 0.25mg  once a week.  If not controlling appetite increase to 0.50 mg dosage once a week  Semaglutide Injection What is this medication? SEMAGLUTIDE (SEM a GLOO tide) treats type 2 diabetes. It works by increasing insulin levels in your body, which decreases your blood sugar (glucose). It also reduces the amount of sugar released into the blood and slows down your digestion. It can also be used to lower the risk of heart attack and stroke in people with type 2 diabetes. Changes to diet and exercise are often combined with this medication. This medicine may be used for other purposes; ask your health care provider or pharmacist if you have questions. COMMON BRAND NAME(S): OZEMPIC What should I tell my care team before I take this medication? They need to know if you have any of these conditions: Endocrine tumors (MEN 2) or if someone in your family had these tumors Eye disease, vision problems History of pancreatitis Kidney disease Stomach problems Thyroid cancer or if someone in your family had thyroid cancer An unusual or allergic reaction to semaglutide, other medications, foods, dyes, or preservatives Pregnant or trying to get pregnant Breast-feeding How should I use this medication? This medication is for injection under the skin of your upper leg (thigh), stomach area, or upper arm. It is given once every week (every 7 days). You will be taught how to prepare and give this medication. Use exactly as directed. Take your medication at regular intervals. Do not take it more often than directed. If you use this medication with insulin, you should inject this medication and the insulin separately. Do not mix them together. Do not give the injections right next to each other. Change (rotate) injection sites with each injection. It is important that you put your used  needles and syringes in a special sharps container. Do not put them in a trash can. If you do not have a sharps container, call your pharmacist or care team to get one. A special MedGuide will be given to you by the pharmacist with each prescription and refill. Be sure to read this information carefully each time. This medication comes with INSTRUCTIONS FOR USE. Ask your pharmacist for directions on how to use this medication. Read the information carefully. Talk to your pharmacist or care team if you have questions. Talk to your care team about the use of this medication in children. Special care may be needed. Overdosage: If you think you have taken too much of this medicine contact a poison control center or emergency room at once. NOTE: This medicine is only for you. Do not share this medicine with others. What if I miss a dose? If you miss a dose, take it as soon as you can within 5 days after the missed dose. Then take your next dose at your regular weekly time. If it has been longer than 5 days after the missed dose, do not take the missed dose. Take the next dose at your regular time. Do not take double or extra doses. If you have questions about a missed dose, contact your care team for advice. What may interact with this medication? Other medications for diabetes Many medications may cause changes in blood sugar, these include: Alcohol containing beverages Antiviral medications for HIV or AIDS Aspirin and aspirin-like medications Certain medications for blood pressure, heart disease, irregular heart beat Chromium Diuretics  Female hormones, such as estrogens or progestins, birth control pills Fenofibrate Gemfibrozil Isoniazid Lanreotide Female hormones or anabolic steroids MAOIs like Carbex, Eldepryl, Marplan, Nardil, and Parnate Medications for weight loss Medications for allergies, asthma, cold, or cough Medications for depression, anxiety, or psychotic  disturbances Niacin Nicotine NSAIDs, medications for pain and inflammation, like ibuprofen or naproxen Octreotide Pasireotide Pentamidine Phenytoin Probenecid Quinolone antibiotics such as ciprofloxacin, levofloxacin, ofloxacin Some herbal dietary supplements Steroid medications such as prednisone or cortisone Sulfamethoxazole; trimethoprim Thyroid hormones Some medications can hide the warning symptoms of low blood sugar (hypoglycemia). You may need to monitor your blood sugar more closely if you are taking one of these medications. These include: Beta-blockers, often used for high blood pressure or heart problems (examples include atenolol, metoprolol, propranolol) Clonidine Guanethidine Reserpine This list may not describe all possible interactions. Give your health care provider a list of all the medicines, herbs, non-prescription drugs, or dietary supplements you use. Also tell them if you smoke, drink alcohol, or use illegal drugs. Some items may interact with your medicine. What should I watch for while using this medication? Visit your care team for regular checks on your progress. Drink plenty of fluids while taking this medication. Check with your care team if you get an attack of severe diarrhea, nausea, and vomiting. The loss of too much body fluid can make it dangerous for you to take this medication. A test called the HbA1C (A1C) will be monitored. This is a simple blood test. It measures your blood sugar control over the last 2 to 3 months. You will receive this test every 3 to 6 months. Learn how to check your blood sugar. Learn the symptoms of low and high blood sugar and how to manage them. Always carry a quick-source of sugar with you in case you have symptoms of low blood sugar. Examples include hard sugar candy or glucose tablets. Make sure others know that you can choke if you eat or drink when you develop serious symptoms of low blood sugar, such as seizures or  unconsciousness. They must get medical help at once. Tell your care team if you have high blood sugar. You might need to change the dose of your medication. If you are sick or exercising more than usual, you might need to change the dose of your medication. Do not skip meals. Ask your care team if you should avoid alcohol. Many nonprescription cough and cold products contain sugar or alcohol. These can affect blood sugar. Pens should never be shared. Even if the needle is changed, sharing may result in passing of viruses like hepatitis or HIV. Wear a medical ID bracelet or chain, and carry a card that describes your disease and details of your medication and dosage times. Do not become pregnant while taking this medication. Women should inform their care team if they wish to become pregnant or think they might be pregnant. There is a potential for serious side effects to an unborn child. Talk to your care team for more information. What side effects may I notice from receiving this medication? Side effects that you should report to your care team as soon as possible: Allergic reactions--skin rash, itching, hives, swelling of the face, lips, tongue, or throat Change in vision Dehydration--increased thirst, dry mouth, feeling faint or lightheaded, headache, dark yellow or brown urine Gallbladder problems--severe stomach pain, nausea, vomiting, fever Heart palpitations--rapid, pounding, or irregular heartbeat Kidney injury--decrease in the amount of urine, swelling of the ankles, hands, or feet Pancreatitis--severe  stomach pain that spreads to your back or gets worse after eating or when touched, fever, nausea, vomiting Thyroid cancer--new mass or lump in the neck, pain or trouble swallowing, trouble breathing, hoarseness Side effects that usually do not require medical attention (report to your care team if they continue or are bothersome): Diarrhea Loss of appetite Nausea Stomach  pain Vomiting This list may not describe all possible side effects. Call your doctor for medical advice about side effects. You may report side effects to FDA at 1-800-FDA-1088. Where should I keep my medication? Keep out of the reach of children. Store unopened pens in a refrigerator between 2 and 8 degrees C (36 and 46 degrees F). Do not freeze. Protect from light and heat. After you first use the pen, it can be stored for 56 days at room temperature between 15 and 30 degrees C (59 and 86 degrees F) or in a refrigerator. Throw away your used pen after 56 days or after the expiration date, whichever comes first. Do not store your pen with the needle attached. If the needle is left on, medication may leak from the pen. NOTE: This sheet is a summary. It may not cover all possible information. If you have questions about this medicine, talk to your doctor, pharmacist, or health care provider.  2022 Elsevier/Gold Standard (2020-07-02 00:00:00)

## 2021-06-04 ENCOUNTER — Other Ambulatory Visit: Payer: Self-pay | Admitting: Internal Medicine

## 2021-06-24 ENCOUNTER — Other Ambulatory Visit: Payer: Self-pay | Admitting: Cardiology

## 2021-07-02 ENCOUNTER — Ambulatory Visit
Admission: RE | Admit: 2021-07-02 | Discharge: 2021-07-02 | Disposition: A | Discharge status: Home / Self Care | Payer: Medicare Other | Source: Ambulatory Visit | Attending: Internal Medicine | Admitting: Internal Medicine

## 2021-07-02 DIAGNOSIS — Z1231 Encounter for screening mammogram for malignant neoplasm of breast: Secondary | ICD-10-CM

## 2021-07-02 HISTORY — DX: Personal history of irradiation: Z92.3

## 2021-08-02 ENCOUNTER — Emergency Department (HOSPITAL_BASED_OUTPATIENT_CLINIC_OR_DEPARTMENT_OTHER): Payer: Medicare Other

## 2021-08-02 ENCOUNTER — Encounter (HOSPITAL_BASED_OUTPATIENT_CLINIC_OR_DEPARTMENT_OTHER): Payer: Self-pay | Admitting: Emergency Medicine

## 2021-08-02 ENCOUNTER — Emergency Department (HOSPITAL_BASED_OUTPATIENT_CLINIC_OR_DEPARTMENT_OTHER)
Admission: EM | Admit: 2021-08-02 | Discharge: 2021-08-02 | Disposition: A | Discharge status: Home / Self Care | Payer: Medicare Other | Attending: Emergency Medicine | Admitting: Emergency Medicine

## 2021-08-02 ENCOUNTER — Other Ambulatory Visit: Payer: Self-pay

## 2021-08-02 ENCOUNTER — Emergency Department (HOSPITAL_BASED_OUTPATIENT_CLINIC_OR_DEPARTMENT_OTHER): Payer: Medicare Other | Admitting: Radiology

## 2021-08-02 DIAGNOSIS — R251 Tremor, unspecified: Secondary | ICD-10-CM | POA: Diagnosis not present

## 2021-08-02 DIAGNOSIS — E860 Dehydration: Secondary | ICD-10-CM | POA: Diagnosis not present

## 2021-08-02 DIAGNOSIS — R531 Weakness: Secondary | ICD-10-CM | POA: Diagnosis present

## 2021-08-02 DIAGNOSIS — R6883 Chills (without fever): Secondary | ICD-10-CM | POA: Insufficient documentation

## 2021-08-02 DIAGNOSIS — W01198A Fall on same level from slipping, tripping and stumbling with subsequent striking against other object, initial encounter: Secondary | ICD-10-CM | POA: Diagnosis not present

## 2021-08-02 DIAGNOSIS — Z20822 Contact with and (suspected) exposure to covid-19: Secondary | ICD-10-CM | POA: Insufficient documentation

## 2021-08-02 DIAGNOSIS — E876 Hypokalemia: Secondary | ICD-10-CM | POA: Insufficient documentation

## 2021-08-02 LAB — URINALYSIS, ROUTINE W REFLEX MICROSCOPIC
Bilirubin Urine: NEGATIVE
Glucose, UA: NEGATIVE mg/dL
Ketones, ur: NEGATIVE mg/dL
Leukocytes,Ua: NEGATIVE
Nitrite: NEGATIVE
Protein, ur: 30 mg/dL — AB
Specific Gravity, Urine: 1.014 (ref 1.005–1.030)
pH: 5.5 (ref 5.0–8.0)

## 2021-08-02 LAB — COMPREHENSIVE METABOLIC PANEL
ALT: 8 U/L (ref 0–44)
AST: 11 U/L — ABNORMAL LOW (ref 15–41)
Albumin: 4.1 g/dL (ref 3.5–5.0)
Alkaline Phosphatase: 87 U/L (ref 38–126)
Anion gap: 12 (ref 5–15)
BUN: 14 mg/dL (ref 8–23)
CO2: 26 mmol/L (ref 22–32)
Calcium: 10.2 mg/dL (ref 8.9–10.3)
Chloride: 100 mmol/L (ref 98–111)
Creatinine, Ser: 1.43 mg/dL — ABNORMAL HIGH (ref 0.44–1.00)
GFR, Estimated: 38 mL/min — ABNORMAL LOW (ref >60)
Glucose, Bld: 125 mg/dL — ABNORMAL HIGH (ref 70–99)
Potassium: 2.9 mmol/L — ABNORMAL LOW (ref 3.5–5.1)
Sodium: 138 mmol/L (ref 135–145)
Total Bilirubin: 0.4 mg/dL (ref 0.3–1.2)
Total Protein: 7.2 g/dL (ref 6.5–8.1)

## 2021-08-02 LAB — RESP PANEL BY RT-PCR (FLU A&B, COVID) ARPGX2
Influenza A by PCR: NEGATIVE
Influenza B by PCR: NEGATIVE
SARS Coronavirus 2 by RT PCR: NEGATIVE

## 2021-08-02 LAB — CBC WITH DIFFERENTIAL/PLATELET
Abs Immature Granulocytes: 0.03 10*3/uL (ref 0.00–0.07)
Basophils Absolute: 0.1 10*3/uL (ref 0.0–0.1)
Basophils Relative: 1 %
Eosinophils Absolute: 0.2 10*3/uL (ref 0.0–0.5)
Eosinophils Relative: 2 %
HCT: 37.2 % (ref 36.0–46.0)
Hemoglobin: 12 g/dL (ref 12.0–15.0)
Immature Granulocytes: 0 %
Lymphocytes Relative: 18 %
Lymphs Abs: 1.7 10*3/uL (ref 0.7–4.0)
MCH: 27.5 pg (ref 26.0–34.0)
MCHC: 32.3 g/dL (ref 30.0–36.0)
MCV: 85.3 fL (ref 80.0–100.0)
Monocytes Absolute: 0.7 10*3/uL (ref 0.1–1.0)
Monocytes Relative: 8 %
Neutro Abs: 6.9 10*3/uL (ref 1.7–7.7)
Neutrophils Relative %: 71 %
Platelets: 306 10*3/uL (ref 150–400)
RBC: 4.36 MIL/uL (ref 3.87–5.11)
RDW: 13.5 % (ref 11.5–15.5)
WBC: 9.7 10*3/uL (ref 4.0–10.5)
nRBC: 0 % (ref 0.0–0.2)

## 2021-08-02 LAB — CBG MONITORING, ED: Glucose-Capillary: 122 mg/dL — ABNORMAL HIGH (ref 70–99)

## 2021-08-02 LAB — TROPONIN I (HIGH SENSITIVITY): Troponin I (High Sensitivity): 4 ng/L (ref <18)

## 2021-08-02 MED ORDER — CEPHALEXIN 500 MG PO CAPS: 500.0000 mg | ORAL_CAPSULE | Freq: Two times a day (BID) | ORAL | 0 refills | Status: AC

## 2021-08-02 MED ORDER — POTASSIUM CHLORIDE CRYS ER 20 MEQ PO TBCR
40.0000 meq | EXTENDED_RELEASE_TABLET | Freq: Once | ORAL | Status: AC
Start: 2021-08-02 — End: 2021-08-02
  Administered 2021-08-02: 40 meq via ORAL
  Filled 2021-08-02: qty 2

## 2021-08-02 MED ORDER — CEPHALEXIN 250 MG PO CAPS
500.0000 mg | ORAL_CAPSULE | Freq: Once | ORAL | Status: AC
  Administered 2021-08-02: 500 mg via ORAL
  Filled 2021-08-02: qty 2

## 2021-08-02 MED ORDER — POTASSIUM CHLORIDE 10 MEQ/100ML IV SOLN
10.0000 meq | Freq: Once | INTRAVENOUS | Status: AC
Start: 2021-08-02 — End: 2021-08-02
  Administered 2021-08-02: 10 meq via INTRAVENOUS
  Filled 2021-08-02: qty 100

## 2021-08-02 MED ORDER — POTASSIUM CHLORIDE ER 10 MEQ PO TBCR: 10.0000 meq | EXTENDED_RELEASE_TABLET | Freq: Every day | ORAL | 0 refills | Status: DC

## 2021-08-02 MED ORDER — SODIUM CHLORIDE 0.9 % IV BOLUS
1000.0000 mL | Freq: Once | INTRAVENOUS | Status: AC
  Administered 2021-08-02: 1000 mL via INTRAVENOUS

## 2021-08-02 MED ORDER — ONDANSETRON 4 MG PO TBDP: 4.0000 mg | ORAL_TABLET | Freq: Three times a day (TID) | ORAL | 0 refills | Status: DC | PRN

## 2021-08-02 NOTE — Discharge Instructions (Signed)
Please pick up your prescriptions from the pharmacy for your low potassium, your nausea, and a possible urine infection.  We talked about the blood culture that was sent today.  If your culture returns positive for bacteria, you will receive a phone call the next 1 to 3 days telling you to come back to the ER immediately for IV antibiotics. ?

## 2021-08-02 NOTE — ED Notes (Signed)
Discharge instructions, follow up care, and prescriptions reviewed and explained, pt verbalized understanding.  

## 2021-08-02 NOTE — ED Triage Notes (Signed)
Pt arrives to ED with c/o dizziness and fall. Pt reports she has been dizzy x1 week with associated symptoms including nausea, vomiting, diarrhea, and dry heaves. Pt reports a fall due to the dizziness where pt lost consciousness and hit the back of her head on her bed.  ?

## 2021-08-02 NOTE — ED Provider Notes (Signed)
?[facility] ?Provider Note ? ? ?CSN: [id number] ?Arrival date & time: 08/02/21  1202 ? ?  ? ?History ? ?Chief Complaint  ?Patient presents with  ? Dizziness  ? Fall  ? ? ?[name] [name] is a 76 y.o. female presenting from home for generalized weakness, episodes of vertigo, nausea, vomiting, diarrhea, poor appetite.  This been ongoing for about a week.  The patient does suffer from vertigo and says it is not unusual for her to have episodes of room spinning and dizziness when she is feeling sick.  She says she has been having nausea and poor appetite, had diarrhea for about 3 days which largely resolved.  She says she had an episode where she feels that she got lightheaded and fell and struck the back of her head on the bedpost.  She does describe some shaking chills, and feeling hot at times. ? ?HPI ? ?  ? ?Home Medications ?Prior to Admission medications   ?Medication Sig Start Date End Date Taking? Authorizing Provider  ?cephALEXin (KEFLEX) 500 MG capsule Take 1 capsule (500 mg total) by mouth 2 (two) times daily for 5 days. 08/02/21 08/07/21 Yes Skylyn Slezak, Carola Rhine, MD  ?ondansetron (ZOFRAN-ODT) 4 MG disintegrating tablet Take 1 tablet (4 mg total) by mouth every 8 (eight) hours as needed for up to 15 doses for nausea or vomiting. 08/02/21  Yes Shantel Helwig, Carola Rhine, MD  ?potassium chloride (KLOR-CON) 10 MEQ tablet Take 1 tablet (10 mEq total) by mouth daily for 30 doses. 08/02/21 09/01/21 Yes Joshua Zeringue, Carola Rhine, MD  ?acetaminophen (TYLENOL) 500 MG tablet Take 1,000 mg by mouth every 6 (six) hours as needed for moderate pain.    [provider]  ?buPROPion (WELLBUTRIN XL) 150 MG 24 hr tablet TAKE 1 TABLET BY MOUTH EVERY DAY IN THE MORNING ?Patient taking differently: Take 150 mg by mouth daily. 10/11/18   Vladimir Crofts, PA-C  ?Cholecalciferol (VITAMIN D3) 250 MCG (10000 UT) TABS Take 5,000 Units by mouth daily.    [provider]  ?COLACE 100 MG capsule Take 100 mg by mouth  2 (two) times daily. 04/28/20   [provider]  ?[facility] 12 HOUR NASAL DECONGESTANT 120 MG 12 hr tablet TAKE 1 TABLET BY MOUTH TWICE DAILY (EVERY 12 HOURS) FOR HEAD/CHEST CONGESTION & EAR FLUID 06/01/21   Magda Bernheim, NP  ?DULoxetine (CYMBALTA) 30 MG capsule TAKE ONE CAPSULE 3 TIMES A DAY 11/09/20   Magda Bernheim, NP  ?ELIQUIS 5 MG TABS tablet TAKE 1 TABLET BY MOUTH TWICE A DAY 06/04/21   Liane Comber, NP  ?furosemide (LASIX) 20 MG tablet TAKE 1 TABLET BY MOUTH TWICE A DAY 03/08/21   Leonie Man, MD  ?gabapentin (NEURONTIN) 300 MG capsule TAKE 1 CAPSULE BY MOUTH TWICE A DAY FOR CHRONIC PAIN 12/06/20   Magda Bernheim, NP  ?ipratropium (ATROVENT) 0.03 % nasal spray PLACE 2 SPRAYS INTO THE NOSE EVERY MONDAY, TUESDAY, WEDNESDAY, THURSDAY, AND FRIDAY. 03/01/21 03/01/22  Magda Bernheim, NP  ?ketoconazole (NIZORAL) 2 % shampoo USE AS DIRECTED TWICE A WEEK 10/29/20   Magda Bernheim, NP  ?levothyroxine (SYNTHROID) 50 MCG tablet TAKE 1/2-1 TABLET DAILY ON EMPTY STOMACH W/ WATER FOR 30 MINS NO ANTACID/MEDS/CALCIUM/MAG. X 4 HOURS ?Patient taking differently: Take 50 mcg by mouth daily before breakfast. 08/24/20   Liane Comber, NP  ?losartan (COZAAR) 100 MG tablet Take 1 tablet (100 mg total) by mouth daily. Please schedule appt for future refills. 1st attempt 06/24/21  Leonie Man, MD  ?meclizine (ANTIVERT) 25 MG tablet 1/2-1 pill up to 3 times daily for motion sickness/dizziness. Caution, may cause drowsiness. ?Patient taking differently: Take 25 mg by mouth 3 (three) times daily as needed for dizziness. 05/24/19   Liane Comber, NP  ?methocarbamol (ROBAXIN) 500 MG tablet Take 1 tablet (500 mg total) by mouth every 6 (six) hours as needed for muscle spasms. 09/24/20   McKenzie, Lennie Muckle, PA-C  ?metoprolol succinate (TOPROL-XL) 50 MG 24 hr tablet Take 1 tablet (50 mg total) by mouth daily. 05/27/21   Leonie Man, MD  ?mometasone (NASONEX) 50 MCG/ACT nasal spray Use 1-2 sprays  to each nostril 1 to 2 x /day ?Patient  taking differently: Place 1-2 sprays into the nose 2 (two) times daily as needed (allergies). 06/22/19   Unk Pinto, MD  ?omeprazole (PRILOSEC) 40 MG capsule Take one capsule twice daily ?Patient taking differently: Take 40 mg by mouth 2 (two) times daily. 08/24/20   Liane Comber, NP  ?potassium chloride SA (KLOR-CON M20) 20 MEQ tablet TAKE ONE TABLET DAILY OR AS DIRECTED BY YOUR PROVIDER. ?Patient taking differently: Take 20 mEq by mouth daily. 10/29/19   Leonie Man, MD  ?prochlorperazine (COMPAZINE) 5 MG tablet TAKE 1/2 TO 1 TABLET 3 TIMES A DAY TO PREVENT VERTIGO & NAUSEA 03/28/21   Unk Pinto, MD  ?rosuvastatin (CRESTOR) 40 MG tablet TAKE 1 TABLET BY MOUTH EVERY DAY, TAKE 2 TABLETS BY MOUTH ON MONDAYS 05/10/21   Magda Bernheim, NP  ?Semaglutide,0.25 or 0.'5MG'$ /DOS, (OZEMPIC, 0.25 OR 0.5 MG/DOSE,) 2 MG/1.5ML SOPN Inject 0.5 mg into the skin once a week. 06/01/21   Magda Bernheim, NP  ?temazepam (RESTORIL) 30 MG capsule TAKE 1 CAPSULE DAILY AT BEDTIME AS NEEDED FOR SLEEP, AVOID TAKING DAILY DUE TO RISK OF TOLERANCE 05/31/21   Magda Bernheim, NP  ?   ? ?Allergies    ?Requip [ropinirole hcl], Minocycline, Tetracyclines & related, Zestril [lisinopril], Zetia [ezetimibe], Codeine, Morphine sulfate, and Sulfa antibiotics   ? ?Review of Systems   ?Review of Systems ? ?Physical Exam ?Updated Vital Signs ?BP 119/68   Pulse 76   Temp 98.3 ?F (36.8 ?C) (Oral)   Resp 16   Ht '5\' 5"'$  (1.651 m)   Wt 96.2 kg   SpO2 100%   BMI 35.28 kg/m?  ?Physical Exam ?Constitutional:   ?   General: She is not in acute distress. ?HENT:  ?   Head: Normocephalic and atraumatic.  ?Eyes:  ?   Conjunctiva/sclera: Conjunctivae normal.  ?   Pupils: Pupils are equal, round, and reactive to light.  ?Cardiovascular:  ?   Rate and Rhythm: Normal rate and regular rhythm.  ?Pulmonary:  ?   Effort: Pulmonary effort is normal. No respiratory distress.  ?Abdominal:  ?   General: There is no distension.  ?   Tenderness: There is no abdominal  tenderness.  ?Skin: ?   General: Skin is warm and dry.  ?Neurological:  ?   General: No focal deficit present.  ?   Mental Status: She is alert. Mental status is at baseline.  ?Psychiatric:     ?   Mood and Affect: Mood normal.     ?   Behavior: Behavior normal.  ? ? ?ED Results / Procedures / Treatments   ?Labs ?(all labs ordered are listed, but only abnormal results are displayed) ?Labs Reviewed  ?URINALYSIS, ROUTINE W REFLEX MICROSCOPIC - Abnormal; Notable for the following components:  ?  Result Value  ? APPearance HAZY (*)   ? Hgb urine dipstick TRACE (*)   ? Protein, ur 30 (*)   ? Bacteria, UA MANY (*)   ? All other components within normal limits  ?COMPREHENSIVE METABOLIC PANEL - Abnormal; Notable for the following components:  ? Potassium 2.9 (*)   ? Glucose, Bld 125 (*)   ? Creatinine, Ser 1.43 (*)   ? AST 11 (*)   ? GFR, Estimated 38 (*)   ? All other components within normal limits  ?CBG MONITORING, ED - Abnormal; Notable for the following components:  ? Glucose-Capillary 122 (*)   ? All other components within normal limits  ?RESP PANEL BY RT-PCR (FLU A&B, COVID) ARPGX2  ?CULTURE, BLOOD (SINGLE)  ?URINE CULTURE  ?CBC WITH DIFFERENTIAL/PLATELET  ?TROPONIN I (HIGH SENSITIVITY)  ? ? ?EKG ?EKG Interpretation ? ?Date/Time:  Monday August 02 2021 12:19:36 EDT ?Ventricular Rate:  91 ?PR Interval:  160 ?QRS Duration: 164 ?QT Interval:  406 ?QTC Calculation: 500 ?R Axis:   -9 ?Text Interpretation: Sinus rhythm Ventricular premature complex Left bundle branch block Baseline wander in lead(s) V1 Confirmed by Octaviano Glow [phone]) on 08/02/2021 12:47:44 PM ? ?Radiology ?DG Chest 2 View ? ?Result Date: 08/02/2021 ?CLINICAL DATA:  evaluate for infection EXAM: CHEST - 2 VIEW COMPARISON:  Radiograph 04/04/2019, chest CT 08/06/2015 FINDINGS: Unchanged cardiomediastinal silhouette. There is no focal airspace consolidation. No pleural effusion. No pneumothorax. No acute osseous abnormality. Cervical spine fusion hardware  noted. IMPRESSION: No evidence of acute cardiopulmonary disease. Electronically Signed   By: Maurine Simmering M.D.   On: 08/02/2021 13:29  ? ?CT Head Wo Contrast ? ?Result Date: 08/02/2021 ?CLINICAL DATA:  Head trauma,

## 2021-08-03 LAB — URINE CULTURE: Culture: NO GROWTH

## 2021-08-05 ENCOUNTER — Other Ambulatory Visit: Payer: Self-pay | Admitting: Nurse Practitioner

## 2021-08-07 LAB — CULTURE, BLOOD (SINGLE)
Culture: NO GROWTH
Special Requests: ADEQUATE

## 2021-08-09 ENCOUNTER — Ambulatory Visit: Payer: Medicare Other | Admitting: Internal Medicine

## 2021-08-09 ENCOUNTER — Encounter: Payer: Self-pay | Admitting: Internal Medicine

## 2021-08-09 VITALS — BP 118/70 | HR 83 | Temp 97.9°F | Resp 16 | Ht 65.0 in | Wt 224.2 lb

## 2021-08-09 DIAGNOSIS — I951 Orthostatic hypotension: Secondary | ICD-10-CM | POA: Diagnosis not present

## 2021-08-09 DIAGNOSIS — I480 Paroxysmal atrial fibrillation: Secondary | ICD-10-CM

## 2021-08-09 DIAGNOSIS — I10 Essential (primary) hypertension: Secondary | ICD-10-CM

## 2021-08-09 DIAGNOSIS — Z79899 Other long term (current) drug therapy: Secondary | ICD-10-CM

## 2021-08-09 DIAGNOSIS — E876 Hypokalemia: Secondary | ICD-10-CM | POA: Diagnosis not present

## 2021-08-09 NOTE — Progress Notes (Signed)
? ? ?Future Appointments  ?Date Time Provider Department  ?08/09/2021  4:00 PM Unk Pinto, MD [facility]  ?08/16/2021  3:20 PM Leonie Man, MD [facility]  ?08/31/2021  2:30 PM Unk Pinto, MD [facility]  ?11/12/2021  3:00 PM [facility] DX DEXA 1 [facility]  ?02/28/2022  2:00 PM Magda Bernheim, NP [facility]  ?06/01/2022  3:00 PM Magda Bernheim, NP [facility]  ? ? ?History of Present Illness: ? ?    Patient is a very nice 76 yo MWF with HTN, ASHD /pAfib /combined Ht failure, HLD, Pre-Diabetes,Vitamin D Deficiency, major Depressive Disorder and episodic vertigo who recently on   08/02/2021  was evaluated at [facility] ER after a fall & striking her head and had a very thorough W/U including negative  CXR, Head CT scan, neg tests for Flu & Covid, Nl EKG, negative telemetry monitoring & Troponins, negative blood & urine cultures  and the only abnormality found was a low K 2.9 + for which she was given IVF & KCl.  As she remained stable & improved, she was released for out-patient f/u.  ? ?    Today find her postural BPs show a significant drop  and she is advised to 1/2 her BP meds pending an upcoming f/u with Dr Ellyn Hack in  1 week. ? ?Postural       Sitting   BP 103/60     P 76               &             Standing   BP   87/54     P 78  ? ? ?Medications ? ?  levothyroxine 50 MCG tablet, take 50 mcg daily before breakfast.) ?  Semaglutide,0.25 or 0.'5MG'$ /DOS, (OZEMPIC, 0.25 OR 0.5 MG/DOSE,) 2 MG/1.5ML SOPN, Inject 0.5 mg into the skin once a week. ? ?  furosemide 20 MG tablet, TAKE 1 TABLET  (TWICE A DAY )  Takes 1 x /day  ? ?  losartan 100 MG tablet, Take 1 tablet daily.  ?  metoprolol succinate-XL 50 MG 24 hr tablet, Take 1 tablet daily. ?  rosuvastatin (40 MG tablet, TAKE 1 TABLET  EVERY DAY, TAKE 2 TABLETS  ON MONDAYS ? ?  [facility] 12 HOUR NASAL DECONGESTANT 120 MG 12 hr tablet, TAKE 1 TABLET TWICE DAILY (EVERY 12 HOURS)  ?  ATROVENT 0.03 % nasal spray, PLACE 2 SPRAYS INTO THE NOSE EVERY MONDAY, TUESDAY, WEDNESDAY, THURSDAY, AND  FRIDAY. ?  NASONEX nasal spray, Place 1-2 sprays into the nose 2 times daily as needed  ?  acetaminophen 500 MG tablet, Take 1,000 mg every 6 hours as needed for moderate pain. ?  ELIQUIS 5 MG TABS tablet, TAKE 1 TABLET TWICE A DAY ?  buPROPion-XL 150 MG 24 hr tablet, TAKE 1 TABLET EVERY DAY  ? VITAMIN D 10,000 u, Take 5,000 Units daily. ?  COLACE 100 MG capsule, Take 100 mg  2 times daily. ?  DULoxetine  30 MG capsule, TAKE ONE CAPSULE 3 TIMES A DAY ?  gabapentin  300 MG capsule, TAKE 1 CAPSULE TWICE A DAY FOR CHRONIC PAIN ?  ketoconazole 2 % shampoo, USE AS DIRECTED TWICE A WEEK ?  meclizine  25 MG tablet, Take 3  times daily as needed  ?  methocarbamol 500 MG tablet, Take 1 tablet  every 6  hours as needed for muscle spasms. ?  omeprazole  40 MG capsule, Take one capsule twice daily  ?  ondansetron ODT 4 MG disintegrating tablet, Take 1 tablet  every 8 hours as needed  ?  potassium chloride 20 MEQ tablet,  Take 20 mEq daily ?  prochlorperazine 5 MG tablet, TAKE 1/2 TO 1 TABLET 3 TIMES A DAY TO PREVENT VERTIGO  ?  temazepam 30 MG capsule, TAKE 1 CAPSULE DAILY AT BEDTIME AS NEEDED  ? ?Problem list ?She has Hyperlipidemia, mixed; Major depressive disorder, recurrent episode, in partial remission ([place]); Essential hypertension; H/O left bundle branch block; Allergic rhinitis; GERD; Radiculopathy; Abnormal glucose; Vitamin D deficiency; Medication management; DDD (degenerative disc disease), lumbar; Encounter for Medicare annual wellness exam; Paroxysmal atrial fibrillation ([place]): CHA2DS2-VASc Score  4 ->  Eliquis; Hypothyroidism; Severe anemia; (HFpEF) heart failure with preserved ejection fraction ([place]); Non-ischemic cardiomyopathy ([place]); Primary osteoarthritis of left knee; Severe obesity with body mass index (BMI) of 35.0 to 39.9 with comorbidity (H[facility]); CKD (chronic kidney disease) stage 3, GFR 30-59 ml/min ([place]); Bradycardia; History of COVID-19; 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 ?  ?Observations/Objective: ? ?BP 118/70   Pulse 83   Temp 97.9 ?F (36.6 ?C)   Resp 16   Ht '5\' 5"'$  (1.651 m)   Wt 224 lb 3.2 oz (101.7 kg)   SpO2 96%   BMI 37.31 kg/m?  ? ?Postural       Sitting   BP 103/60     P 76               &             Standing   BP   87/54     P 78  ? ? ?HEENT - WNL. ?Neck - supple.  ?Chest - Clear equal BS. ?Cor - Nl HS. RRR w/o sig MGR. PP 1(+). No edema. ?MS- FROM w/o deformities.  Gait Nl. ?Neuro -  Nl w/o focal abnormalities. ? ?Assessment and Plan: ? ? ?1. Essential hypertension ? ?- CBC with Differential/Platelet ?- Magnesium ? ?2. Postural hypotension ? ?- Advised ?                 - Take Her Toprol 50 XL x 1/23 tab qam  & ?                 - Take her  Losartan 100 mg x 1/2 tab qpm  ? ?- Also advised monitoring  ?       postural sitting & standing BP  & Pulse Bid  ?      and keep record to take to appt with Dr Ellyn Hack  ? ? ?3. Paroxysmal atrial fibrillation ([place]):  ?      CHA2DS2-VASc Score  4 ->  Eliquis ? ? ?4. Hypokalemia ? ?- COMPLETE METABOLIC PANEL WITH GFR ? ?5. Medication management ? ?- CBC with Differential/Platelet ?- Magnesium ?- COMPLETE METABOLIC PANEL WITH GFR ? ? ? ? ? ?Follow Up Instructions: ? ?  ?    I discussed the assessment and treatment plan with the patient.  Patient was strongly cautioned re: postural fall precautions . The patient was provided an opportunity to ask questions and all were answered. The patient agreed with the plan and demonstrated an understanding of the instructions. ?  ?    The patient was advised to call back or seek an in-person evaluation if the symptoms worsen or if the condition fails to improve as anticipated. ? ? ?Kirtland Bouchard,  MD ? ?

## 2021-08-09 NOTE — Patient Instructions (Addendum)
Orthostatic Hypotension ?Blood pressure is a measurement of how strongly, or weakly, your circulating blood is pressing against the walls of your arteries. Orthostatic hypotension is a drop in blood pressure that can happen when you change positions, such as when you go from lying down to standing. ?Arteries are blood vessels that carry blood from your heart throughout your body. When blood pressure is too low, you may not get enough blood to your brain or to the rest of your organs. Orthostatic hypotension can cause light-headedness, sweating, rapid heartbeat, blurred vision, and fainting. These symptoms require further investigation into the cause. ?What are the causes? ?Orthostatic hypotension can be caused by many things, including: ?Sudden changes in posture, such as standing up quickly after you have been sitting or lying down. ?Loss of blood (anemia) or loss of body fluids (dehydration). ?Heart problems, neurologic problems, or hormone problems. ?Pregnancy. ?Aging. The risk for this condition increases as you get older. ?Severe infection (sepsis). ?Certain medicines, such as medicines for high blood pressure or medicines that make the body lose excess fluids (diuretics). ?What are the signs or symptoms? ?Symptoms of this condition may include: ?Weakness, light-headedness, or dizziness. ?Sweating. ?Blurred vision. ?Tiredness (fatigue). ?Rapid heartbeat. ?Fainting, in severe cases. ?How is this diagnosed? ?This condition is diagnosed based on: ?Your symptoms and medical history. ?Your blood pressure measurements. Your health care provider will check your blood pressure when you are: ?Lying down. ?Sitting. ?Standing. ?A blood pressure reading is recorded as two numbers, such as "120 over 80" (or 120/80). The first ("top") number is called the systolic pressure. It is a measure of the pressure in your arteries as your heart beats. The second ("bottom") number is called the diastolic pressure. It is a measure of  the pressure in your arteries when your heart relaxes between beats. Blood pressure is measured in a unit called mmHg. Healthy blood pressure for most adults is 120/80 mmHg. Orthostatic hypotension is defined as a 20 mmHg drop in systolic pressure or a 10 mmHg drop in diastolic pressure within 3 minutes of standing. ?Other information or tests that may be used to diagnose orthostatic hypotension include: ?Your other vital signs, such as your heart rate and temperature. ?Blood tests. ?An electrocardiogram (ECG) or echocardiogram. ?A Holter monitor. This is a device you wear that records your heart rhythm continuously, usually for 24-48 hours. ?Tilt table test. For this test, you will be safely secured to a table that moves you from a lying position to an upright position. Your heart rhythm and blood pressure will be monitored during the test. ?How is this treated? ?This condition may be treated by: ?Changing your diet. This may involve eating more salt (sodium) or drinking more water. ?Changing the dosage of certain medicines you are taking that might be lowering your blood pressure. ?Correcting the underlying reason for the orthostatic hypotension. ?Wearing compression stockings. ?Taking medicines to raise your blood pressure. ?Avoiding actions that trigger symptoms. ?Follow these instructions at home: ?Medicines ?Take over-the-counter and prescription medicines only as told by your health care provider. ?Follow instructions from your health care provider about changing the dosage of your current medicines, if this applies. ?Do not stop or adjust any of your medicines on your own. ?Eating and drinking ?Drink enough fluid to keep your urine pale yellow. ?Eat extra salt only as directed. Do not add extra salt to your diet unless advised by your health care provider. ?Eat frequent, small meals. ?Avoid standing up suddenly after eating. ?General instructions ?Get up  slowly from lying down or sitting positions. This  gives your blood pressure a chance to adjust. ?Avoid hot showers and excessive heat as directed by your health care provider. ?Engage in regular physical activity as directed by your health care provider. ?If you have compression stockings, wear them as told. ?Keep all follow-up visits. This is important. ?Contact a health care provider if: ?You have a fever for more than 2-3 days. ?You feel more thirsty than usual. ?You feel dizzy or weak. ?Get help right away if: ?You have chest pain. ?You have a fast or irregular heartbeat. ?You become sweaty or feel light-headed. ?You feel short of breath. ?You faint. ?You have any symptoms of a stroke. "BE FAST" is an easy way to remember the main warning signs of a stroke: ?B - Balance. Signs are dizziness, sudden trouble walking, or loss of balance. ?E - Eyes. Signs are trouble seeing or a sudden change in vision. ?F - Face. Signs are sudden weakness or numbness of the face, or the face or eyelid drooping on one side. ?A - Arms. Signs are weakness or numbness in an arm. This happens suddenly and usually on one side of the body. ?S - Speech. Signs are sudden trouble speaking, slurred speech, or trouble understanding what people say. ?T - Time. Time to call emergency services. Write down what time symptoms started. ?You have other signs of a stroke, such as: ?A sudden, severe headache with no known cause. ?Nausea or vomiting. ?Seizure. ?These symptoms may represent a serious problem that is an emergency. Do not wait to see if the symptoms will go away. Get medical help right away. Call your local emergency services (911 in the [place] Do not drive yourself to the hospital. ?Summary ?Orthostatic hypotension is a sudden drop in blood pressure. ?It can cause light-headedness, sweating, rapid heartbeat, blurred vision, and fainting. ?Orthostatic hypotension can be diagnosed by having your blood pressure taken while lying down, sitting, and then standing. ?Treatment may involve  changing your diet, wearing compression stockings, sitting up slowly, adjusting your medicines, or correcting the underlying reason for the orthostatic hypotension. ?Get help right away if you have chest pain, a fast or irregular heartbeat, or symptoms of a stroke. ? ?

## 2021-08-10 ENCOUNTER — Other Ambulatory Visit: Payer: Self-pay | Admitting: Internal Medicine

## 2021-08-10 LAB — COMPLETE METABOLIC PANEL WITH GFR
AG Ratio: 1.7 (calc) (ref 1.0–2.5)
ALT: 9 U/L (ref 6–29)
AST: 12 U/L (ref 10–35)
Albumin: 4.3 g/dL (ref 3.6–5.1)
Alkaline phosphatase (APISO): 99 U/L (ref 37–153)
BUN/Creatinine Ratio: 12 (calc) (ref 6–22)
BUN: 15 mg/dL (ref 7–25)
CO2: 29 mmol/L (ref 20–32)
Calcium: 9.7 mg/dL (ref 8.6–10.4)
Chloride: 100 mmol/L (ref 98–110)
Creat: 1.25 mg/dL — ABNORMAL HIGH (ref 0.60–1.00)
Globulin: 2.5 g/dL (calc) (ref 1.9–3.7)
Glucose, Bld: 86 mg/dL (ref 65–99)
Potassium: 3.5 mmol/L (ref 3.5–5.3)
Sodium: 141 mmol/L (ref 135–146)
Total Bilirubin: 0.3 mg/dL (ref 0.2–1.2)
Total Protein: 6.8 g/dL (ref 6.1–8.1)
eGFR: 45 mL/min/{1.73_m2} — ABNORMAL LOW (ref >=60)

## 2021-08-10 LAB — CBC WITH DIFFERENTIAL/PLATELET
Absolute Monocytes: 746 cells/uL (ref 200–950)
Basophils Absolute: 105 cells/uL (ref 0–200)
Basophils Relative: 1 %
Eosinophils Absolute: 179 cells/uL (ref 15–500)
Eosinophils Relative: 1.7 %
HCT: 35.6 % (ref 35.0–45.0)
Hemoglobin: 11.5 g/dL — ABNORMAL LOW (ref 11.7–15.5)
Lymphs Abs: 1806 cells/uL (ref 850–3900)
MCH: 27.9 pg (ref 27.0–33.0)
MCHC: 32.3 g/dL (ref 32.0–36.0)
MCV: 86.4 fL (ref 80.0–100.0)
MPV: 12.4 fL (ref 7.5–12.5)
Monocytes Relative: 7.1 %
Neutro Abs: 7665 cells/uL (ref 1500–7800)
Neutrophils Relative %: 73 %
Platelets: 299 10*3/uL (ref 140–400)
RBC: 4.12 10*6/uL (ref 3.80–5.10)
RDW: 13.3 % (ref 11.0–15.0)
Total Lymphocyte: 17.2 %
WBC: 10.5 10*3/uL (ref 3.8–10.8)

## 2021-08-10 LAB — MAGNESIUM: Magnesium: 2.3 mg/dL (ref 1.5–2.5)

## 2021-08-10 MED ORDER — TRAMADOL HCL 50 MG PO TABS: ORAL_TABLET | ORAL | 0 refills | Status: DC

## 2021-08-10 NOTE — Progress Notes (Signed)
<><><><><><><><><><><><><><><><><><><><><><><><><><><><><><><><><> ?<><><><><><><><><><><><><><><><><><><><><><><><><><><><><><><><><> ?-   Test results slightly outside the reference range are not unusual. ?If there is anything important, I will review this with you,  ?otherwise it is considered normal test values.  ?If you have further questions,  ?please do not hesitate to contact me at the office or via My Chart.  ?<><><><><><><><><><><><><><><><><><><><><><><><><><><><><><><><><> ?<><><><><><><><><><><><><><><><><><><><><><><><><><><><><><><><><> ? ?-  CBC - OK - Hgb is stable & WBC is Normal  ?<><><><><><><><><><><><><><><><><><><><><><><><><><><><><><><><><> ? ?-  All Else - Glucose  - Kidneys - Electrolytes - Liver - Magnesium  - all  Normal / OK ?<><><><><><><><><><><><><><><><><><><><><><><><><><><><><><><><><> ?<><><><><><><><><><><><><><><><><><><><><><><><><><><><><><><><><> ? ? ? ? ? ? ? ? ? ? ? ? ? ? ?

## 2021-08-16 ENCOUNTER — Encounter: Payer: Self-pay | Admitting: Cardiology

## 2021-08-16 ENCOUNTER — Ambulatory Visit: Payer: Medicare Other | Admitting: Cardiology

## 2021-08-16 VITALS — BP 130/70 | HR 81 | Ht 65.0 in | Wt 224.4 lb

## 2021-08-16 DIAGNOSIS — I10 Essential (primary) hypertension: Secondary | ICD-10-CM

## 2021-08-16 DIAGNOSIS — I493 Ventricular premature depolarization: Secondary | ICD-10-CM

## 2021-08-16 DIAGNOSIS — Z8679 Personal history of other diseases of the circulatory system: Secondary | ICD-10-CM

## 2021-08-16 DIAGNOSIS — I428 Other cardiomyopathies: Secondary | ICD-10-CM | POA: Diagnosis not present

## 2021-08-16 DIAGNOSIS — I951 Orthostatic hypotension: Secondary | ICD-10-CM | POA: Diagnosis not present

## 2021-08-16 DIAGNOSIS — I5032 Chronic diastolic (congestive) heart failure: Secondary | ICD-10-CM

## 2021-08-16 DIAGNOSIS — I480 Paroxysmal atrial fibrillation: Secondary | ICD-10-CM

## 2021-08-16 DIAGNOSIS — E876 Hypokalemia: Secondary | ICD-10-CM

## 2021-08-16 MED ORDER — FUROSEMIDE 20 MG PO TABS: 20.0000 mg | ORAL_TABLET | ORAL | 3 refills | Status: DC

## 2021-08-16 MED ORDER — LOSARTAN POTASSIUM 50 MG PO TABS: 50.0000 mg | ORAL_TABLET | Freq: Every day | ORAL | 3 refills | Status: DC

## 2021-08-16 MED ORDER — SPIRONOLACTONE 25 MG PO TABS: 12.5000 mg | ORAL_TABLET | ORAL | 3 refills | Status: DC

## 2021-08-16 MED ORDER — METOPROLOL SUCCINATE ER 25 MG PO TB24: 25.0000 mg | ORAL_TABLET | Freq: Every day | ORAL | 3 refills | Status: DC

## 2021-08-16 NOTE — Patient Instructions (Addendum)
Medication Instructions:  ? ?  Change to lasix 20 mg Monday- Wednesday-fridays ?Start spironolactone 12.5 mg  ( 1/2 tablet of 25 mg)  Tuesday, Thursday ,Saturday ,Sunday  ? ? ? Continue with ?Toprol XL 25 mg  daily ? Losartan 50 mg  daily  ? ?*If you need a refill on your cardiac medications before your next appointment, please call your pharmacy* ? ? ?Lab Work: ?BMP week  before May 23,2023 with Dr Melford Aase ? ?If you have labs (blood work) drawn today and your tests are completely normal, you will receive your results only by: ?MyChart Message (if you have MyChart) OR ?A paper copy in the mail ?If you have any lab test that is abnormal or we need to change your treatment, we will call you to review the results. ? ? ?Testing/Procedures: ?not needed ? ? ? ?Follow-Up: ?At [facility], you and your health needs are our priority.  As part of our continuing mission to provide you with exceptional heart care, we have created designated Provider Care Teams.  These Care Teams include your primary Cardiologist (physician) and Advanced Practice Providers (APPs -  Physician Assistants and Nurse Practitioners) who all work together to provide you with the care you need, when you need it. ? ?  ? ?Your next appointment:   ?2 month(s) ? ?The format for your next appointment:   ?In Person ? ?Provider:   ?Glenetta Hew, MD  ? ? ?Other Instructions  ?

## 2021-08-16 NOTE — Progress Notes (Signed)
? ? ?Primary Care Provider: Unk Pinto, MD ?Cardiologist: Glenetta Hew, MD ?Electrophysiologist: None ? ?Clinic Note: ?Chief Complaint  ?Patient presents with  ? Follow-up  ?  Today to annual follow-up  ? Hospitalization Follow-up  ?  August 02, 2021: ER visit with possible UTI and hypokalemia  ? Atrial Fibrillation  ?  Tolerating well.  ? ? ?=================================== ? ?ASSESSMENT/PLAN  ? ?Problem List Items Addressed This Visit   ? ?  ? Cardiology Problems  ? Paroxysmal atrial fibrillation ([place]): CHA2DS2-VASc Score  4 ->  Eliquis (Chronic)  ?  Thankfully, she seems to be pretty much asymptomatic with no recurrent symptoms.  She clearly had heart failure symptoms when she was in A-fib last.  She therefore is well aware of her symptoms.  Her EF dropped dramatically while in A-fib. ? ?Rate is pretty well controlled.  She is now on reduced dose of Toprol taking 50 mg daily which is acceptable based on her improved blood pressure. ?Tolerating Eliquis. => Okay to hold for procedures. ?  ?  ? Relevant Medications  ? metoprolol succinate (TOPROL XL) 25 MG 24 hr tablet  ? losartan (COZAAR) 50 MG tablet  ? furosemide (LASIX) 20 MG tablet  ? spironolactone (ALDACTONE) 25 MG tablet  ? Other Relevant Orders  ? EKG 12-Lead (Completed)  ? Basic metabolic panel  ? Non-ischemic cardiomyopathy (HCC) (Chronic)  ?  Resolved.  Likely related to A-fib with LBBB. ?Still has residual HFpEF ? ?  ?  ? Relevant Medications  ? metoprolol succinate (TOPROL XL) 25 MG 24 hr tablet  ? losartan (COZAAR) 50 MG tablet  ? furosemide (LASIX) 20 MG tablet  ? spironolactone (ALDACTONE) 25 MG tablet  ? Other Relevant Orders  ? EKG 12-Lead (Completed)  ? Basic metabolic panel  ? Essential hypertension (Chronic)  ?  Having more issues with hypotension than hypertension.  Doing better with reduced dose of beta-blocker and ARB. ? ?  ?  ? Relevant Medications  ? metoprolol succinate (TOPROL XL) 25 MG 24 hr tablet  ? losartan (COZAAR) 50 MG  tablet  ? furosemide (LASIX) 20 MG tablet  ? spironolactone (ALDACTONE) 25 MG tablet  ? Other Relevant Orders  ? EKG 12-Lead (Completed)  ? Basic metabolic panel  ? (HFpEF) heart failure with preserved ejection fraction (HCC) (Chronic)  ?  Now with class I-II HFpEF symptoms.  He really does not have any PND orthopnea, edema also pretty well controlled.  Nunzio Cory been taking once daily Lasix for quite some time now despite this she still had issues with hypokalemia. ? ?Her beta-blocker and ARB just recently. ? ?Plan: ?Continue Toprol 25 mg daily along with losartan 50 mg daily. ?Change furosemide to 1 tablet on Monday Wednesday and Friday- ?Take additional dose either on dosing today or nondialysis day for weight gain more than 3 pounds. ?Add spironolactone 12.5 mg daily on not on Lasix days. ? ? ?  ?  ? Relevant Medications  ? metoprolol succinate (TOPROL XL) 25 MG 24 hr tablet  ? losartan (COZAAR) 50 MG tablet  ? furosemide (LASIX) 20 MG tablet  ? spironolactone (ALDACTONE) 25 MG tablet  ? 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  Seem to be less frequent.  Continue current beta-blocker.  Doing well with lower dose.  For now I am perfectly happy with keeping her dose  at 25 mg Toprol ? ?  ?  ? Relevant Medications  ? metoprolol succinate (TOPROL XL) 25 MG 24 hr tablet  ?  losartan (COZAAR) 50 MG tablet  ? furosemide (LASIX) 20 MG tablet  ? spironolactone (ALDACTONE) 25 MG tablet  ? Orthostatic hypotension - Primary (Chronic)  ?  Notably improved with reduced dose of beta-blocker and ARB. ?She was having lots of falls which can lead in part be related to hypotension but also concerning hypokalemia. ? ?Plan: ?Completely agree with Dr. Melford Aase -> we will continue with this dose of Toprol and losartan (20 mg Toprol and 50 mg losartan) ?I will go 1 step further and have her reduce her furosemide to 3 days a week (Monday Wednesday Friday) => additional doses as needed worsening edema. ?On non-Lasix days, she will take  spironolactone 12.5 mg daily which will help supplement potassium. ?She should get a BMP checked before she sees Dr. Melford Aase on May 23 ?  ?  ? Relevant Medications  ? metoprolol succinate (TOPROL XL) 25 MG 24 hr tablet  ? losartan (COZAAR) 50 MG tablet  ? furosemide (LASIX) 20 MG tablet  ? spironolactone (ALDACTONE) 25 MG tablet  ? Other Relevant Orders  ? EKG 12-Lead (Completed)  ? Basic metabolic panel  ?  ? Other  ? H/O left bundle branch block (Chronic)  ?  Longstanding LBBB. ? ?Probably the main etiology of her reduced EF was at a combination of A-fib plus LBBB. ? ?Overall we can maintain sinus rhythm, she is doing well. ? ?  ?  ? Hypokalemia (Chronic)  ?  This is not a completely new issue.  She has had potassium down to the 30s before.  We had to supplement her potassium, but it seems like the most recent hospital/ER stay showed pretty low potassium level of 2.9. ? ?She needs to continue potassium supplementation, but we will also reduce Lasix dosing to 3 days a week and add spironolactone 12.5 mg on the other 4 days of the week. ? ?Follow-up chemistry panel before seen by PCP. ? ?  ?  ? Relevant Orders  ? EKG 12-Lead (Completed)  ? Basic metabolic panel  ? ? ?=================================== ? ?HPI:   ? ?[name] [name] is a 76 y.o. female with a PMH below who presents today for delayed annual follow-up.  She is being seen today at the request of Unk Pinto, MD. ? ?PMH: ?History of Nonischemic Cardiomyopathy with EF of 40 to 50%-resolved EF 60 to 65% on Echo in 2022 (RESOLVED) ?HFpEF (NYHA class II),  ?LBBB,  ?PAF ([name]),  ?CKD-3 ?Episodic Vertigo ?MDD ? ?[name] [name] was last seen in Jan 2022 -> doing well.  Tolerated knee surgery previous December.  Just limited mobility because recovery.  Pain much improved.  Had some episodes of vertigo prior to surgery.  Had some nausea fatigue and poor balance on March 19, 2020.  From a cardiac standpoint she barely noted any palpitations.   Usually noted when she is sedentary.  Denies any heart failure or angina symptoms.  No sensation of being in A-fib.  No bleeding [name].  Well-controlled edema.  Wanted to cut back on Lasix. ?Recommended reducing to once daily furosemide with additional dose PRN. ? ?Recent Hospitalizations:  ?08/02/2021: ER - [facility]; noted weakness, vertigo and nausea while vomiting diarrhea with poor appetite for about a week.  Noted having episodes where she was spinning and had dizziness.  Apparently she had this to the point where she fell backwards and hit the back of her head on the bedpost. ?Treated for UTI.   ?Only really abnormal finding was potassium  2.9.   ?Treated with IV fluids and potassium  ?Given prescription for potassium supplementation. ?Zofran for nausea ? ?Seen by PCP 08/09/21-> for ER follow-up.  Blood pressure recordings were 103/60  / Postural Hypotension => told to cut BP meds in 1/2 & arrange Cardiology F/u.  ?Ordered repeat CMP/Mag/CBC ? ? ?Reviewed  CV studies:   ? ?The following studies were reviewed today: (if available, images/films reviewed: From Epic Chart or Care Everywhere) ?none: ? ? ?Interval History:  ? ?[name] [name] presents here today for delayed follow-up noting that she has had a pretty bad last several months.  She has been having a lot of issues with falls and nausea, dizziness.  She has had 4 falls in the last 6 months.  She has lost about 10 pounds since I last saw her.  Not eating and drinking as well as she had. ?Her edema had notably improved since she cut taking her Lasix down to just once a day, but was still having worsening dizziness and vertigo type symptoms with falls.  The emergency room visit was just the one episode where she fell hit her back of her head. ?She says she feels a lot better since her blood pressure medication doses were cut in half.  She feels much less dizzy, but still has some residual weakness. ? ?No real PND, orthopnea but does still have some  edema. ?No chest pain or pressure with rest or exertion. ?No symptoms to suggest recurrence of A-fib.Marland Kitchen ?She has had these falls and dizziness, but has not had any true syncope or TIA/amaurosis fugax symptoms. ? ?REVIEWED

## 2021-08-21 ENCOUNTER — Encounter: Payer: Self-pay | Admitting: Cardiology

## 2021-08-21 NOTE — Assessment & Plan Note (Signed)
Resolved.  Likely related to A-fib with LBBB. ?Still has residual HFpEF ?

## 2021-08-21 NOTE — Assessment & Plan Note (Signed)
This is not a completely new issue.  She has had potassium down to the 30s before.  We had to supplement her potassium, but it seems like the most recent hospital/ER stay showed pretty low potassium level of 2.9. ? ?She needs to continue potassium supplementation, but we will also reduce Lasix dosing to 3 days a week and add spironolactone 12.5 mg on the other 4 days of the week. ? ?Follow-up chemistry panel before seen by PCP. ?

## 2021-08-21 NOTE — Assessment & Plan Note (Signed)
Longstanding LBBB. ? ?Probably the main etiology of her reduced EF was at a combination of A-fib plus LBBB. ? ?Overall we can maintain sinus rhythm, she is doing well. ?

## 2021-08-21 NOTE — Assessment & Plan Note (Signed)
Now with class I-II HFpEF symptoms.  He really does not have any PND orthopnea, edema also pretty well controlled.  Nunzio Cory been taking once daily Lasix for quite some time now despite this she still had issues with hypokalemia. ? ?Her beta-blocker and ARB just recently. ? ?Plan: ?? Continue Toprol 25 mg daily along with losartan 50 mg daily. ?? Change furosemide to 1 tablet on Monday Wednesday and Friday- ?? Take additional dose either on dosing today or nondialysis day for weight gain more than 3 pounds. ?? Add spironolactone 12.5 mg daily on not on Lasix days. ? ? ?

## 2021-08-21 NOTE — Assessment & Plan Note (Signed)
Seem to be less frequent.  Continue current beta-blocker.  Doing well with lower dose.  For now I am perfectly happy with keeping her dose  at 25 mg Toprol ?

## 2021-08-21 NOTE — Assessment & Plan Note (Signed)
Notably improved with reduced dose of beta-blocker and ARB. ?She was having lots of falls which can lead in part be related to hypotension but also concerning hypokalemia. ? ?Plan: ?? Completely agree with Dr. Melford Aase -> we will continue with this dose of Toprol and losartan (20 mg Toprol and 50 mg losartan) ?? I will go 1 step further and have her reduce her furosemide to 3 days a week (Monday Wednesday Friday) => additional doses as needed worsening edema. ?? On non-Lasix days, she will take spironolactone 12.5 mg daily which will help supplement potassium. ?? She should get a BMP checked before she sees Dr. Melford Aase on May 23 ?

## 2021-08-21 NOTE — Assessment & Plan Note (Signed)
Thankfully, she seems to be pretty much asymptomatic with no recurrent symptoms.  She clearly had heart failure symptoms when she was in A-fib last.  She therefore is well aware of her symptoms.  Her EF dropped dramatically while in A-fib. ? ?? Rate is pretty well controlled.  She is now on reduced dose of Toprol taking 50 mg daily which is acceptable based on her improved blood pressure. ?? Tolerating Eliquis. => Okay to hold for procedures. ?

## 2021-08-21 NOTE — Assessment & Plan Note (Signed)
Having more issues with hypotension than hypertension.  Doing better with reduced dose of beta-blocker and ARB. ?

## 2021-08-30 ENCOUNTER — Other Ambulatory Visit: Payer: Self-pay | Admitting: Nurse Practitioner

## 2021-08-30 DIAGNOSIS — R7309 Other abnormal glucose: Secondary | ICD-10-CM

## 2021-08-30 MED ORDER — SEMAGLUTIDE(0.25 OR 0.5MG/DOS) 2 MG/3ML ~~LOC~~ SOPN: 0.5000 mg | PEN_INJECTOR | SUBCUTANEOUS | 3 refills | Status: DC

## 2021-08-30 NOTE — Progress Notes (Unsigned)
Future Appointments  Date Time Provider Department  08/31/2021  2:30 PM Unk Pinto, MD [facility]  10/15/2021 10:20 AM Leonie Man, MD [facility]  11/12/2021  3:00 PM [facility] DX DEXA 1 [facility]  02/28/2022  2:00 PM Magda Bernheim, NP [facility]  06/01/2022  3:00 PM Magda Bernheim, NP [facility]    History of Present Illness:                                                       This very nice 76 y.o.  MWF presents for 3 month follow up with HTN, HLD, Pre-Diabetes and Vitamin D Deficiency.  Patient also has hx/o a major Depressive Disorder seemingly adequately compensated on Wellbutrin and she's also on Cymbalta for Depression and chronic pain Syndrome.                                 Over the last year patient has been followed for anemia with last Hgb 7.8 gm% . Stool hemoccults were Negative and iron level = "7"  was very low in Dec 2021 and = "4"  In Feb 2022 .  Iron levels may be low due to poor absorption from concomitantly taking a PPI (Omeprazole).    Patient has been followed in the past by Dr Jana Hakim for Cancer of the left breast.                                                         Patient is treated for HTN (1988) & BP has been controlled at home. Today's BP is at goal - 128/78.  In Apr 2017, she was discovered in [facility]) and started [name].  Initial heart cath had a low EF 25-330% and subsequent echo in July found EF improved at 45-50%. 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 Patient has had no complaints of any cardiac type chest pain, palpitations, dyspnea / orthopnea / PND, dizziness, claudication, or dependent edema.                                                         Hyperlipidemia is controlled with diet & meds. Patient denies myalgias or other med SE's. Last Lipids were at goal:   Lab Results  Component Value Date   CHOL 144 06/01/2021   HDL 51 06/01/2021   LDLCAL  68 06/01/2021   TRIG 170 (H) 06/01/2021   CHOLHDL 2.8 06/01/2021      Also, the patient has Morbid Obesity  (BMI 39.34+) and consequent PreDiabetes (A1c 6.3% /2017) and has had no symptoms of reactive hypoglycemia, diabetic polys, paresthesias or visual blurring.  Last A1c was still not at goal:   Lab Results  Component Value Date   HGBA1C 5.9 (H) 02/25/2021  In 2016, she was discovered Hypothyroid  and initiated on replacement therapy.                                                                           Further, the patient also has history of Vitamin D Deficiency ("13" /2016) and supplements vitamin D without any suspected side-effects. Last vitamin D was at goal:     Lab Results  Component Value Date   VD25OH 51 02/25/2021             Current Outpatient Medications on File Prior to Visit  Medication Sig   acetaminophen (TYLENOL) 500 MG tablet Take 1,000 mg by mouth every 6 (six) hours as needed for moderate pain.   apixaban (ELIQUIS) 5 MG TABS tablet Take 1 tablet (5 mg total) by mouth 2 (two) times daily.   buPROPion (WELLBUTRIN XL) 150 MG 24 hr tablet TAKE 1 TABLET BY MOUTH EVERY DAY IN THE MORNING   Cholecalciferol (VITAMIN D) 2000 UNITS tablet Take 4,000 Units by mouth daily.   Coenzyme Q10 (COQ10) 100 MG CAPS Take 300 mg by mouth daily.   COLACE 100 MG capsule Take 100 mg by mouth 2 (two) times daily as needed.   dicyclomine (BENTYL) 10 MG capsule Take 1 capsule (10 mg total) by mouth 4 (four) times daily for 14 days.   DULoxetine (CYMBALTA) 30 MG capsule Take 30 mg by mouth 2 (two) times daily.   furosemide (LASIX) 20 MG tablet Take 1 tablet (20 mg total) by mouth 2 (two) times daily.   gabapentin (NEURONTIN) 300 MG capsule TAKE 1 CAPSULE BY MOUTH TWICE A DAY FOR CHRONIC PAIN   HYDROcodone-acetaminophen (NORCO/VICODIN) 5-325 MG tablet Take by mouth.   ipratropium (ATROVENT) 0.03 % nasal spray Place 2 sprays  into the nose 3 (three) times daily.   ketoconazole (NIZORAL) 2 % shampoo USE AS DIRECTED ON AFFECTED AREA TWICE A WEEK   levothyroxine  50 MCG tablet TAKE 1/2-1 TABLET DAILY    losartan  100 MG tablet Take 1 tablet  daily.   meclizine  25 MG tablet 1/2-1 pill up to 3 times daily for motion sickness/dizziness.   metoprolol succinate -XL)50 MG 24 hr tablet TAKE 1 TABLET EVERY DAY WITH A MEAL   NASONEX nasal spray Use 1-2 sprays  to each nostril 1 to 2 x /day   neomycin-polymyxin-hydrocortisone OTIC solution Apply 1-2 drops to toe BID after soaking   nystatin ointment  Apply  2 () times daily.   omeprazole  40 MG capsule Take one capsule twice daily   potassium chloride  20 MEQ tablet TAKE ONE TABLET DAILY OR AS DIRECTED BY YOUR PROVIDER.   rosuvastatin 40 MG tablet TAKE 1 TABLET EVERY DAY, TAKE 2 TABLETS  ON MONDAYS   temazepam  30 MG capsule TAKE 1 CAPSULE  DAILY AT BEDTIME    traMADol  50 MG tablet Take 50 mg by mouth every 6 (six) hours as needed.   triamcinolone ointment  0.1 % Apply 1 application topically 2 (two) times daily.    No current facility-administered medications on file prior to visit.             Allergies  Allergen Reactions  Requip [Ropinirole Hcl] Shortness Of Breath and Nausea And Vomiting   Minocycline Hives   Tetracyclines & Related Hives   Zestril [Lisinopril] Cough   Zetia [Ezetimibe] Other (See Comments)      MYALGIA JOINT PAIN   Codeine Other (See Comments)      fatigue   Morphine Sulfate Other (See Comments)      Palpitations   Sulfa Antibiotics Other (See Comments)      Headache (pt states that a blood pressure medicine caused a severe headache but doesn't remember a reaction to sulfa)        PMHx:       Past Medical History:  Diagnosis Date   Anemia     Anxiety     Arthritis     Breast cancer (HCC)       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   Chronic back pain      cyst sitting on L4-5;slipped disc   Depression       takes Cymbalta daily   Diverticulosis     Dysrhythmia     GERD (gastroesophageal reflux disease)      takes Omeprazole daily   Headache(784.0)      rare   History of gastric ulcer at age 77   History of pancreatitis 03/27/2008   History of ulcer disease 08/06/2015   Hyperlipidemia     Hypertension      takes Cardura nightly and Verapamil daily   Hypothyroidism     Insomnia      takes Restoril nightly   Malignant neoplasm of lower-inner quadrant of left breast in female, estrogen receptor positive ([place]) 05/20/2013   Nonischemic cardiomyopathy ([place]) 07/2015    Normal coronary arteries by cath. EF was 25-30% by echo. GR2 DD - likely related to LBBB in setting of A. fib   Obesity (BMI 30-39.9) 06/11/2013   Paroxysmal atrial fibrillation ([place]): CHA2DS2-VASc Score - starting Eliquis 08/06/2015    This patients CHA2DS2-VASc Score and unadjusted Ischemic Stroke Rate (% per year) is equal to 4.8 % stroke rate/year from a score of 4  Above score calculated as 1 point each if present [CHF, HTN, DM, Vascular=MI/PAD/Aortic Plaque, Age if 65-74, or Female]; 2 points each if present [Age > 75, or Stroke/TIA/TE]   Pneumonia due to COVID-19 virus 04/05/2019   Pneumonia due to COVID-19 virus 04/04/2019    Admitted December 24-30, 2020-acute hypoxic respite failure.   PONV (postoperative nausea and vomiting)     S/P radiation therapy 07/18/2013-08/28/2013    1) Left Breast / 50 Gy in 25 fractions/ 2) Left Breast Boost / 10 Gy in 5 fractions   Shingles              Immunization History  Administered Date(s) Administered   Fluad Quad(high Dose ) 01/15/2020   Influenza Whole 01/01/2013   Influenza, High Dose  01/29/2014, 12/30/2014, 01/17/2019   Influenza,inj,Quad  01/18/2018   Influenza- 12/11/2015, 02/04/2017, 01/18/2018   PFIZER-SARS-COV-2 Vacc 05/18/2019, 06/10/2019   Pneumococcal -13 06/16/2014   Pneumococcal \-23 05/23/2011   Td 09/24/2018   Tdap 03/11/2008   Zoster 02/02/2010              Past Surgical History:  Procedure Laterality Date   ABDOMINAL HYSTERECTOMY       BREAST LUMPECTOMY Left      neg   CARDIAC CATHETERIZATION N/A 08/10/2015    Procedure: Right/Left Heart Cath and Coronary Angiography;  Surgeon: Burnell Blanks, MD;  Location: [place]  [facility];  Service: Cardiovascular;: Nonobstructive CAD   CATARACT EXTRACTION BILATERAL W/ ANTERIOR VITRECTOMY Bilateral     CHOLECYSTECTOMY       COLONOSCOPY   2014   DUPUYTREN / PALMAR FASCIOTOMY Bilateral 2007    x 3 to left and once to the right    epidural injections        x 3   FOOT TENDON SURGERY Right ~ 11/2015    "stepped in a hole and tore 2 tendons"   JOINT REPLACEMENT       KNEE ARTHROSCOPY Bilateral     LUMBAR FUSION   10/2013    Dr. Lynann Bologna   TONSILLECTOMY       TOTAL KNEE ARTHROPLASTY Left 09/23/2016   TOTAL KNEE ARTHROPLASTY Left 09/23/2016    Procedure: TOTAL KNEE ARTHROPLASTY;  Surgeon: Dorna Leitz, MD;  Location: [place] [facility];  Service: Orthopedics;  Laterality: Left;   TRANSTHORACIC ECHOCARDIOGRAM   08/07/2015    Severely reduced LVEF at 25-30% with diffuse HK. GR 2 DD. Ventricular dyssynergy secondary to LBBB. Small to moderate pericardial effusion but no hemodynamic compromise   TRANSTHORACIC ECHOCARDIOGRAM   10/2015     (EF up from 25-30%): - Left ventricle: Poor acoustic windows -  difficult to see.    EF estimated at 45-50% with inferoseptal and possible posterior hypokinesis. GR 1 DD. No pericardial effusion.   TRANSTHORACIC ECHOCARDIOGRAM   04/05/2019    EF 60 to 65% without wall motion normality.  GR 1 DD.  Mild LA dilation.  Trivial MR.  Frequent PVCs noted.   TUBAL LIGATION       UPPER GI ENDOSCOPY          FHx:    Reviewed / unchanged   SHx:    Reviewed / unchanged    Systems Review:   Constitutional: Denies fever, chills, wt changes, headaches, insomnia, fatigue, night sweats, change in appetite. Eyes: Denies redness, blurred vision, diplopia, discharge, itchy, watery eyes.  ENT:  Denies discharge, congestion, post nasal drip, epistaxis, sore throat, earache, hearing loss, dental pain, tinnitus, vertigo, sinus pain, snoring.  CV: Denies chest pain, palpitations, irregular heartbeat, syncope, dyspnea, diaphoresis, orthopnea, PND, claudication or edema. Respiratory: denies cough, dyspnea, DOE, pleurisy, hoarseness, laryngitis, wheezing.  Gastrointestinal: Denies dysphagia, odynophagia, heartburn, reflux, water brash, abdominal pain or cramps, nausea, vomiting, bloating, diarrhea, constipation, hematemesis, melena, hematochezia  or hemorrhoids. Genitourinary: Denies dysuria, frequency, urgency, nocturia, hesitancy, discharge, hematuria or flank pain. Musculoskeletal: Denies arthralgias, myalgias, stiffness, jt. swelling, pain, limping or strain/sprain.  Skin: Denies pruritus, rash, hives, warts, acne, eczema or change in skin lesion(s). Neuro: No weakness, tremor, incoordination, spasms, paresthesia or pain. Psychiatric: Denies confusion, memory loss or sensory loss. Endo: Denies change in weight, skin or hair change.  Heme/Lymph: No excessive bleeding, bruising or enlarged lymph nodes.   Physical Exam   .vitalsigns   Appears  Over nourished, well groomed  and in no distress.   Eyes: PERRLA, EOMs, conjunctiva no swelling or erythema. Sinuses: No frontal/maxillary tenderness ENT/Mouth: EAC's clear, TM's nl w/o erythema, bulging. Nares clear w/o erythema, swelling, exudates. Oropharynx clear without erythema or exudates. Oral hygiene is good. Tongue normal, non obstructing. Hearing intact.  Neck: Supple. Thyroid not palpable. Car 2+/2+ without bruits, nodes or JVD. Chest: Respirations nl with BS clear & equal w/o rales, rhonchi, wheezing or stridor.  Cor: Heart sounds normal w/ regular rate and rhythm without sig. murmurs, gallops, clicks or rubs. Peripheral pulses normal and equal  without edema.  Abdomen: Soft &  bowel sounds normal. Non-tender w/o guarding, rebound,  hernias, masses or organomegaly.  Lymphatics: Unremarkable.  Musculoskeletal: Full ROM all peripheral extremities, joint stability, 5/5 strength and normal gait.  Skin: Warm, dry without exposed rashes, lesions or ecchymosis apparent.  Neuro: Cranial nerves intact, reflexes equal bilaterally. Sensory-motor testing grossly intact. Tendon reflexes grossly intact.  Pysch: Alert & oriented x 3.  Insight and judgement nl & appropriate. No ideations.   Assessment and Plan:   1. Essential hypertension   - Continue medication, monitor blood pressure at home.  - Continue DASH diet.  Reminder to go to the ER if any CP,  SOB, nausea, dizziness, severe HA, changes vision/speech.   - CBC with Differential/Platelet - COMPLETE METABOLIC PANEL WITH GFR - TSH   2. Hyperlipidemia, mixed - Continue diet/meds, exercise,& lifestyle modifications.  - Continue monitor periodic cholesterol/liver & renal functions    - Lipid panel - TSH   3. Abnormal glucose   - Continue diet, exercise  - Lifestyle modifications.  - Monitor appropriate labs.     - Hemoglobin A1c - Insulin, random   4. Vitamin D deficiency   - Continue supplementation.   - VITAMIN D 25 Hydroxy    5. Hypothyroidism   - TSH   6. Stage 3a chronic kidney disease (HCC)   - COMPLETE METABOLIC PANEL WITH GFR   7. Severe obesity with body mass index (BMI) of  39.34 with comorbidity ([place])     8. Medication management   - CBC with Differential/Platelet - COMPLETE METABOLIC PANEL WITH GFR - Lipid panel - TSH - Hemoglobin A1c - Insulin, random - VITAMIN D 25 Hydroxy                                                                                   Discussed  regular exercise, BP monitoring, weight control to achieve/maintain BMI less than 25 and discussed med and SE's. Recommended labs to assess and monitor clinical status with further disposition pending results of labs.  I discussed the assessment and treatment plan with  the patient. The patient was provided an opportunity to ask questions and all were answered. The patient agreed with the plan and demonstrated an understanding of the instructions.  I provided over 30 minutes of exam, counseling, chart review and  complex critical decision making.                                                                                   The patient was advised to call back or seek an in-person evaluation if the symptoms worsen or if the condition fails to improve as anticipated.     Kirtland Bouchard, MD

## 2021-08-31 ENCOUNTER — Ambulatory Visit: Payer: Medicare Other | Admitting: Internal Medicine

## 2021-08-31 ENCOUNTER — Encounter: Payer: Self-pay | Admitting: Internal Medicine

## 2021-08-31 VITALS — BP 118/68 | HR 88 | Temp 97.9°F | Resp 16 | Ht 65.0 in | Wt 220.6 lb

## 2021-08-31 DIAGNOSIS — Z170 Estrogen receptor positive status [ER+]: Secondary | ICD-10-CM

## 2021-08-31 DIAGNOSIS — I480 Paroxysmal atrial fibrillation: Secondary | ICD-10-CM

## 2021-08-31 DIAGNOSIS — E559 Vitamin D deficiency, unspecified: Secondary | ICD-10-CM | POA: Diagnosis not present

## 2021-08-31 DIAGNOSIS — E782 Mixed hyperlipidemia: Secondary | ICD-10-CM | POA: Diagnosis not present

## 2021-08-31 DIAGNOSIS — Z79899 Other long term (current) drug therapy: Secondary | ICD-10-CM

## 2021-08-31 DIAGNOSIS — I10 Essential (primary) hypertension: Secondary | ICD-10-CM | POA: Diagnosis not present

## 2021-08-31 DIAGNOSIS — N1831 Chronic kidney disease, stage 3a: Secondary | ICD-10-CM

## 2021-08-31 DIAGNOSIS — R7309 Other abnormal glucose: Secondary | ICD-10-CM | POA: Diagnosis not present

## 2021-08-31 DIAGNOSIS — E039 Hypothyroidism, unspecified: Secondary | ICD-10-CM

## 2021-08-31 DIAGNOSIS — C50312 Malignant neoplasm of lower-inner quadrant of left female breast: Secondary | ICD-10-CM

## 2021-08-31 LAB — BASIC METABOLIC PANEL
BUN/Creatinine Ratio: 15 (ref 12–28)
BUN: 16 mg/dL (ref 8–27)
CO2: 22 mmol/L (ref 20–29)
Calcium: 9.8 mg/dL (ref 8.7–10.3)
Chloride: 101 mmol/L (ref 96–106)
Creatinine, Ser: 1.04 mg/dL — ABNORMAL HIGH (ref 0.57–1.00)
Glucose: 90 mg/dL (ref 70–99)
Potassium: 4.6 mmol/L (ref 3.5–5.2)
Sodium: 139 mmol/L (ref 134–144)
eGFR: 56 mL/min/{1.73_m2} — ABNORMAL LOW (ref >59)

## 2021-08-31 NOTE — Patient Instructions (Signed)

## 2021-09-01 LAB — LIPID PANEL
Cholesterol: 116 mg/dL (ref <200)
HDL: 45 mg/dL — ABNORMAL LOW (ref >=50)
LDL Cholesterol (Calc): 47 mg/dL (calc)
Non-HDL Cholesterol (Calc): 71 mg/dL (calc) (ref <130)
Total CHOL/HDL Ratio: 2.6 (calc) (ref <5.0)
Triglycerides: 160 mg/dL — ABNORMAL HIGH (ref <150)

## 2021-09-01 LAB — COMPLETE METABOLIC PANEL WITH GFR
AG Ratio: 1.7 (calc) (ref 1.0–2.5)
ALT: 8 U/L (ref 6–29)
AST: 14 U/L (ref 10–35)
Albumin: 4.2 g/dL (ref 3.6–5.1)
Alkaline phosphatase (APISO): 89 U/L (ref 37–153)
BUN/Creatinine Ratio: 18 (calc) (ref 6–22)
BUN: 21 mg/dL (ref 7–25)
CO2: 24 mmol/L (ref 20–32)
Calcium: 9.6 mg/dL (ref 8.6–10.4)
Chloride: 103 mmol/L (ref 98–110)
Creat: 1.14 mg/dL — ABNORMAL HIGH (ref 0.60–1.00)
Globulin: 2.5 g/dL (calc) (ref 1.9–3.7)
Glucose, Bld: 110 mg/dL — ABNORMAL HIGH (ref 65–99)
Potassium: 4.5 mmol/L (ref 3.5–5.3)
Sodium: 137 mmol/L (ref 135–146)
Total Bilirubin: 0.3 mg/dL (ref 0.2–1.2)
Total Protein: 6.7 g/dL (ref 6.1–8.1)
eGFR: 50 mL/min/{1.73_m2} — ABNORMAL LOW (ref >=60)

## 2021-09-01 LAB — VITAMIN D 25 HYDROXY (VIT D DEFICIENCY, FRACTURES): Vit D, 25-Hydroxy: 62 ng/mL (ref 30–100)

## 2021-09-01 LAB — CBC WITH DIFFERENTIAL/PLATELET
Absolute Monocytes: 771 cells/uL (ref 200–950)
Basophils Absolute: 122 cells/uL (ref 0–200)
Basophils Relative: 1.3 %
Eosinophils Absolute: 226 cells/uL (ref 15–500)
Eosinophils Relative: 2.4 %
HCT: 33.6 % — ABNORMAL LOW (ref 35.0–45.0)
Hemoglobin: 10.7 g/dL — ABNORMAL LOW (ref 11.7–15.5)
Lymphs Abs: 1748 cells/uL (ref 850–3900)
MCH: 27.9 pg (ref 27.0–33.0)
MCHC: 31.8 g/dL — ABNORMAL LOW (ref 32.0–36.0)
MCV: 87.5 fL (ref 80.0–100.0)
MPV: 11.8 fL (ref 7.5–12.5)
Monocytes Relative: 8.2 %
Neutro Abs: 6533 cells/uL (ref 1500–7800)
Neutrophils Relative %: 69.5 %
Platelets: 292 10*3/uL (ref 140–400)
RBC: 3.84 10*6/uL (ref 3.80–5.10)
RDW: 13.8 % (ref 11.0–15.0)
Total Lymphocyte: 18.6 %
WBC: 9.4 10*3/uL (ref 3.8–10.8)

## 2021-09-01 LAB — HEMOGLOBIN A1C
Hgb A1c MFr Bld: 6.2 % of total Hgb — ABNORMAL HIGH (ref <5.7)
Mean Plasma Glucose: 131 mg/dL
eAG (mmol/L): 7.3 mmol/L

## 2021-09-01 LAB — MAGNESIUM: Magnesium: 2.2 mg/dL (ref 1.5–2.5)

## 2021-09-01 LAB — INSULIN, RANDOM: Insulin: 120.9 u[IU]/mL — ABNORMAL HIGH

## 2021-09-01 LAB — TSH: TSH: 0.66 mIU/L (ref 0.40–4.50)

## 2021-09-01 NOTE — Progress Notes (Signed)
<><><><><><><><><><><><><><><><><><><><><><><><><><><><><><><><><> <><><><><><><><><><><><><><><><><><><><><><><><><><><><><><><><><> - Test results slightly outside the reference range are not unusual. If there is anything important, I will review this with you,  otherwise it is considered normal test values.  If you have further questions,  please do not hesitate to contact me at the office or via My Chart.  <><><><><><><><><><><><><><><><><><><><><><><><><><><><><><><><><>  - Mild Anemia appears stable & will continue to monitor  <><><><><><><><><><><><><><><><><><><><><><><><><><><><><><><><><>  -  Kidney Functions - still Stage 3a  & Stable  <><><><><><><><><><><><><><><><><><><><><><><><><><><><><><><><><>  -  A1c = 6.2% - Blood sugar and A1c are STILL elevated in the borderline and                                                              early or pre-diabetes range which has the same   300% increased risk for heart attack, stroke, cancer and                                                alzheimer- type vascular dementia as full blown diabetes.   But the good news is that diet, exercise with  weight loss can                                                                                  cure the early diabetes at this point. <><><><><><><><><><><><><><><><><><><><><><><><><><><><><><><><><>  -  Insulin level = 120 is very high  &   shows Insulin Resistance             (   Normal Insulin levels should be less than 20  !  )   -  and Insulin Resistance is a sign of early diabetes and                                                              associated with a 300 % greater risk for                           heart attacks, strokes, cancer & Alzheimer type vascular dementia   - All this can be cured  and prevented with losing weight -   get Dr Fara Olden Fuhrman's book 'the End of Diabetes" and "the End of Dieting" -   and add many years of good health to your  life. <><><><><><><><><><><><><><><><><><><><><><><><><><><><><><><><><>  -  Vitamin D = 62 - Great - Please keep dose same  <><><><><><><><><><><><><><><><><><><><><><><><><><><><><><><><><>  -  All Else - CBC - Kidneys - Electrolytes - Liver - Magnesium & Thyroid    - all  Normal / OK <><><><><><><><><><><><><><><><><><><><><><><><><><><><><><><><><> <><><><><><><><><><><><><><><><><><><><><><><><><><><><><><><><><>

## 2021-09-04 ENCOUNTER — Other Ambulatory Visit: Payer: Self-pay | Admitting: Nurse Practitioner

## 2021-09-04 DIAGNOSIS — F5101 Primary insomnia: Secondary | ICD-10-CM

## 2021-09-07 ENCOUNTER — Telehealth: Payer: Self-pay | Admitting: Nurse Practitioner

## 2021-09-07 NOTE — Telephone Encounter (Signed)
We have refill request.  I am letting Dr Melford Aase evalaute due to her other medications

## 2021-09-07 NOTE — Telephone Encounter (Signed)
Pt is requesting a refill on temazepam (RESTORIL) 30 MG capsule to go to [facility] on file please

## 2021-09-09 ENCOUNTER — Other Ambulatory Visit: Payer: Self-pay | Admitting: Internal Medicine

## 2021-09-09 DIAGNOSIS — F5101 Primary insomnia: Secondary | ICD-10-CM

## 2021-09-09 MED ORDER — TRAZODONE HCL 150 MG PO TABS: ORAL_TABLET | ORAL | 0 refills | Status: DC

## 2021-09-16 ENCOUNTER — Other Ambulatory Visit: Payer: Self-pay | Admitting: Internal Medicine

## 2021-09-16 DIAGNOSIS — F5101 Primary insomnia: Secondary | ICD-10-CM

## 2021-09-16 MED ORDER — HYDROXYZINE HCL 50 MG PO TABS: ORAL_TABLET | ORAL | 0 refills | Status: DC

## 2021-09-20 ENCOUNTER — Other Ambulatory Visit: Payer: Self-pay | Admitting: Cardiology

## 2021-10-09 ENCOUNTER — Other Ambulatory Visit: Payer: Self-pay | Admitting: Internal Medicine

## 2021-10-09 DIAGNOSIS — F5101 Primary insomnia: Secondary | ICD-10-CM

## 2021-10-15 ENCOUNTER — Ambulatory Visit: Payer: Medicare Other | Admitting: Cardiology

## 2021-10-15 ENCOUNTER — Encounter: Payer: Self-pay | Admitting: Cardiology

## 2021-10-15 VITALS — BP 117/78 | HR 75 | Ht 65.0 in | Wt 214.0 lb

## 2021-10-15 DIAGNOSIS — Z8679 Personal history of other diseases of the circulatory system: Secondary | ICD-10-CM

## 2021-10-15 DIAGNOSIS — I428 Other cardiomyopathies: Secondary | ICD-10-CM | POA: Diagnosis not present

## 2021-10-15 DIAGNOSIS — E782 Mixed hyperlipidemia: Secondary | ICD-10-CM | POA: Diagnosis not present

## 2021-10-15 DIAGNOSIS — I480 Paroxysmal atrial fibrillation: Secondary | ICD-10-CM

## 2021-10-15 DIAGNOSIS — I5032 Chronic diastolic (congestive) heart failure: Secondary | ICD-10-CM

## 2021-10-15 DIAGNOSIS — I10 Essential (primary) hypertension: Secondary | ICD-10-CM | POA: Diagnosis not present

## 2021-10-15 DIAGNOSIS — I493 Ventricular premature depolarization: Secondary | ICD-10-CM

## 2021-10-15 NOTE — Assessment & Plan Note (Signed)
Doing well with no further breakthrough episodes of A-fib.  Tolerating the reduced dose of beta-blocker with no tachycardia spells.  Just a few short little bursts.  As long as [name] [name] is not having A-fib, [name] [name] is not really having heart failure symptoms.  Doing well [name] and Toprol at current dose.  No change.   Okay to hold Eliquis for procedures or surgeries.  48 hours for standard procedure in 72 hours for high risk spinal/neurologic procedures.

## 2021-10-15 NOTE — Assessment & Plan Note (Signed)
Stable, less frequent and more less symptomatic on current dose of beta-blocker.  She even tolerated reducing dose of beta-blocker.  Not requiring anything PRN.

## 2021-10-15 NOTE — Progress Notes (Signed)
Primary Care Provider: Unk Pinto, MD Cardiologist: [name] Hew, MD Electrophysiologist: None  Clinic Note: Chief Complaint  Patient presents with   Follow-up    56-monthfollow-up to reassess pressures and CHF.  Doing much better.    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  Trivial HFpEF.  Now down to just taking furosemide 3 days a week for maintenance.  Has not required any additional dosing.  With significant weight loss, her symptomatology is notably improved.      Relevant Orders   EKG 12-Lead   Paroxysmal atrial fibrillation ([place]: CHA2DS2-VASc Score  4 ->  Eliquis - Primary (Chronic)    Doing well with no further breakthrough episodes of A-fib.  Tolerating the reduced dose of beta-blocker with no tachycardia spells.  Just a few short little bursts.  As long as she is not having A-fib, she is not really having heart failure symptoms.  Doing well [name] and Toprol at current dose.  No change.  Okay to hold Eliquis for procedures or surgeries.  48 hours for standard procedure in 72 hours for high risk spinal/neurologic procedures.      Relevant Orders   EKG 12-Lead   (HFpEF) heart failure with preserved ejection fraction (HCC) (Chronic)    Euvolemic.  Stable moderate dose ARB and low-dose beta-blocker with 3 days a week furosemide for maintenance.  Not requiring additional.  Dosing.  NYHA class I-II symptoms, notably improved as she continues to lose weight      Essential hypertension (Chronic)    Blood pressure is doing well.  She has been having issues with hypotension previously we have therefore backed down on her losartan to 50 mg and Toprol 25 mg.  She is doing well with no major issues since that change.  Blood pressures look good.       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  She even tolerated reducing dose of beta-blocker.  Not requiring anything PRN.      Hyperlipidemia, mixed (Chronic)    Excellent lipids on recent check.  LDL 47 on current dose of rosuvastatin.  Tolerating relatively well.  She is taking 2 tablets on Monday and 1 tablet every other day.  I think she can probably just stick with taking 1 tablet daily.        Other   H/O left bundle branch block (Chronic)    Longstanding finding.  Nonischemic, however I think the combination of A-fib and LBBB led to her cardiomyopathy.  Continue to monitor, for now no indication for any further procedures or evaluations.      Severe obesity with body mass index (BMI) of 35.0 to 39.9 with comorbidity (HCC) (Chronic)    Still losing weight, it seems that this is all revolve around her starting Ozempic.  Regardless of the reason, she is losing weight and it is intentional.  Feeling much better.  Energy level picking up as she loses weight.  Also try to increase exercise level.      ===================================  HPI:    [name] VEITHis a 76y.o. female with a PMH below who presents today for delayed annual follow-up.  She is being seen today at  the request of [name] Pinto, MD.  PMH: History of Nonischemic Cardiomyopathy with EF of 40 to 50%-resolved EF 60 to 65% on Echo in 2022 (RESOLVED) HFpEF (NYHA Class I-II),  LBBB,  PAF ([name]),  CKD-3 Episodic Vertigo MDD S/p Knee Replacement Dec 2021.    [name] [name] had gone to the ER on April 24 with weakness, vertigo nausea/vomiting, diarrhea etc.  Noted to have profound hypokalemia along with vertigo and dizziness.  She had an episode where she fell backwards and hit the back of her head on the bedpost. => Treated with IV fluids and potassium supplementation as well as for UTI.   Treated with antiemetics. Seen by PCP 08/09/21-> for ER follow-up.  Blood pressure recordings were 103/60  / Postural Hypotension => told to cut BP meds in 1/2 & arrange Cardiology F/u.  `Ordered repeat CMP/Mag/CBC   [name] [name] was seen on Aug 16, 2021 for ER follow-up at the request of her PCP.  This is a delayed annual follow-up but also hospital follow-up.  She had 4 falls in 6 months and having lots of issues with dizziness.  She also noted losing 10 pounds in the last several months since I seen her.  Was not eating and drinking as much and has been cutting out foods that she should not be eating.  (Not mentioned at that time, she was started on Ozempic). => Unfortunately, she was hovering nausea, vomiting and diarrhea in the setting of taking furosemide and losing weight. => She still noted having some malaise fatigue.  Some ongoing weight loss (but somewhat intentional).  No longer having the nausea vomiting.  Was feeling better since medication changes were made.  She also noted that her dizziness and weakness had improved since the ER visit with reduction in medications. I reduce the furosemide to just taking it Monday Wednesday Friday with the intention of trying to start low-dose spironolactone (12.5 mg) on the other 4 days of the week to allow for some diuresis but helping to potassium level. I agreed with the reduction of Toprol to 25 and losartan to 50. Plan was to check BMP prior to seeing PCP.  => Then 37-monthfollow-up with me.  Recent Hospitalizations: None   Reviewed  CV studies:    The following studies were reviewed today: (if available, images/films reviewed: From Epic Chart or Care Everywhere) none:  Interval History:   [name] MOLLApresents here today for follow-up doing a whole lot better.  She really is feeling a whole lot better than she had before.  She has lost more weight still, stating that the Ozempic is really working.  Her blood pressures have been if  anything going even lower than they had before.  She is not thankfully having any further episodes of lightheadedness or dizziness or syncope/near syncope.  No longer having a GI episodes.  She thinks that maybe the GI symptoms are related to the Ozempic but she is not sure.  Regardless, she is able to tolerate it now not having any further issues.  No more falls.  No syncope/near syncope or TIA/amaurosis fugax.  She is actually more active now and not necessarily noting any exertional dyspnea.  Really NYHA class I CHF symptoms.  No PND, orthopnea or edema.  No chest pain or pressure with exertion.  She denies any sensation of A-fib.  Just a few skipped beats here and there but nothing prolonged nothing to last more than a minute or so if  anything.  She did not tolerate taking spironolactone, she is a bit whenever she took that she felt herself break out in a diffuse peticular rash with this warm sensation all over.  It only occurred when she took the pills.  She therefore stopped taking it.  She is now only just taking the Lasix 3 days a week and feeling fine.  No further edema.  REVIEWED OF SYSTEMS   Review of Systems  Constitutional:  Positive for weight loss (Still losing weight.  Intentional.). Negative for malaise/fatigue (Energy level definitely better.).  HENT:  Negative for nosebleeds.   Respiratory:  Negative for cough and shortness of breath.   Cardiovascular:        Per HPI.  Stable edema  Gastrointestinal:  Negative for abdominal pain, blood in stool, diarrhea, melena, nausea and vomiting (Prior to her last fall).       Just not eating as much.  No longer having the nausea and vomiting.  Genitourinary:  Negative for frequency and hematuria.  Musculoskeletal:  Positive for joint pain and myalgias (Rare cramps). Negative for falls (No more falls).  Neurological:  Positive for dizziness (Barely notices that at all.Marland Kitchen). Negative for focal weakness and weakness (Regaining her strength.).   Psychiatric/Behavioral:  Negative for depression and memory loss. The patient is not nervous/anxious and does not have insomnia.     I have reviewed and (if needed) personally updated the patient's problem list, medications, allergies, past medical and surgical history, social and family history.   PAST MEDICAL HISTORY   Past Medical History:  Diagnosis Date   Anemia    Anxiety    Arthritis    Chronic back pain    cyst sitting on L4-5;slipped disc   Chronic heart failure with preserved ejection fraction (HFpEF) ([place]) 03/2019   EF improved from 25 and 30% back to 50 to 65% with GR 1 DD.   Depression    takes Cymbalta daily   Diverticulosis    GERD (gastroesophageal reflux disease)    takes Omeprazole daily   Headache(784.0)    rare   History of pancreatitis 03/27/2008   History of ulcer disease 08/06/2015   Hyperlipidemia    Hypertension    takes Cardura nightly and Verapamil daily   Hypothyroidism    Insomnia    takes Restoril nightly   Malignant neoplasm of lower-inner quadrant of left breast in female, estrogen receptor positive ([place]) 05/20/2013   L Breast Cancer   Obesity (BMI 30-39.9) 06/11/2013   Paroxysmal atrial fibrillation ([place]): CHA2DS2-VASc Score - starting Eliquis 08/06/2015   This patients CHA2DS2-VASc Score and unadjusted Ischemic Stroke Rate (% per year) is equal to 4.8 % stroke rate/year from a score of 4  Above score calculated as 1 point each if present [CHF, HTN, DM, Vascular=MI/PAD/Aortic Plaque, Age if 65-74, or Female]; 2 points each if present [Age > 75, or Stroke/TIA/TE]   Personal history of radiation therapy    2015   Pneumonia due to COVID-19 virus 04/05/2019   PONV (postoperative nausea and vomiting)    Pre-diabetes    RESOLVED: Nonischemic cardiomyopathy ([place]) 07/2015   Normal coronary arteries by cath. EF was 25-30% by echo. GR2 DD - likely related to LBBB in setting of A. fib => EF as of 03/2019: 60 to 65%, no RWMA.  GR 1 DD.   S/P radiation  therapy 07/18/2013-08/28/2013   1) Left Breast / 50 Gy in 25 fractions/ 2) Left Breast Boost / 10 Gy in 5 fractions  Shingles     PAST SURGICAL HISTORY   Past Surgical History:  Procedure Laterality Date   ABDOMINAL HYSTERECTOMY     ANTERIOR CERVICAL DECOMP/DISCECTOMY FUSION N/A 09/24/2020   Procedure: ANTERIOR CERVICAL DECOMPRESSION FUSION CERVICAL 5- CERVICAL 6, CERVICAL 6 -  CERVICAL 7 WITH INSTRUMENTATION AND ALLOGRAFT;  Surgeon: Phylliss Bob, MD;  Location: [facility];  Service: Orthopedics;  Laterality: N/A;   BACK SURGERY     BREAST LUMPECTOMY Left    1972 neg   BREAST LUMPECTOMY Left    2015 breast cancer   CARDIAC CATHETERIZATION N/A 08/10/2015   Procedure: Right/Left Heart Cath and Coronary Angiography;  Surgeon: Burnell Blanks, MD;  Location: [facility];  Service: Cardiovascular;: Nonobstructive CAD   CATARACT EXTRACTION BILATERAL W/ ANTERIOR VITRECTOMY Bilateral    CHOLECYSTECTOMY     COLONOSCOPY  2014   DUPUYTREN / PALMAR FASCIOTOMY Bilateral 2007   x 3 to left and once to the right    epidural injections     x 3   FOOT TENDON SURGERY Right ~ 11/2015   "stepped in a hole and tore 2 tendons"   JOINT REPLACEMENT     KNEE ARTHROSCOPY Bilateral    LUMBAR FUSION  10/2013   Dr. Lynann Bologna   TONSILLECTOMY     TOTAL KNEE ARTHROPLASTY Left 09/23/2016   TOTAL KNEE ARTHROPLASTY Left 09/23/2016   Procedure: TOTAL KNEE ARTHROPLASTY;  Surgeon: Dorna Leitz, MD;  Location: [facility];  Service: Orthopedics;  Laterality: Left;   TRANSTHORACIC ECHOCARDIOGRAM  08/07/2015   Severely reduced LVEF at 25-30% with diffuse HK. GR 2 DD. Ventricular dyssynergy secondary to LBBB. Small to moderate pericardial effusion but no hemodynamic compromise   TRANSTHORACIC ECHOCARDIOGRAM  10/2015    (EF up from 25-30%): - Left ventricle: Poor acoustic windows -  difficult to see.    EF estimated at 45-50% with inferoseptal and possible posterior hypokinesis. GR 1 DD. No pericardial effusion.    TRANSTHORACIC ECHOCARDIOGRAM  04/05/2019   EF 60 to 65% without wall motion normality.  GR 1 DD.  Mild LA dilation.  Trivial MR.  Frequent PVCs noted.   TUBAL LIGATION     UPPER GI ENDOSCOPY      Immunization History  Administered Date(s) Administered   Fluad Quad(high Dose 65+) 01/15/2020   Influenza Whole 01/01/2013   Influenza, High Dose Seasonal PF 01/29/2014, 12/30/2014, 01/17/2019, 01/14/2021   Influenza,inj,Quad PF,6+ Mos 01/18/2018   Influenza-Unspecified 12/11/2015, 02/04/2017, 01/18/2018   PFIZER(Purple Top)SARS-COV-2 Vaccination 05/18/2019, 06/10/2019   Pneumococcal Conjugate-13 06/16/2014   Pneumococcal Polysaccharide-23 05/23/2011   Td 09/24/2018   Tdap 03/11/2008   Zoster, Live 02/02/2010    MEDICATIONS/ALLERGIES   Current Meds  Medication Sig   buPROPion (WELLBUTRIN XL) 150 MG 24 hr tablet TAKE 1 TABLET BY MOUTH EVERY DAY IN THE MORNING (Patient taking differently: Take 150 mg by mouth daily.)   Cholecalciferol (VITAMIN D3) 250 MCG (10000 UT) TABS Take 5,000 Units by mouth daily.   COLACE 100 MG capsule Take 100 mg by mouth 2 (two) times daily.   DULoxetine (CYMBALTA) 30 MG capsule TAKE ONE CAPSULE 3 TIMES A DAY   ELIQUIS 5 MG TABS tablet TAKE 1 TABLET BY MOUTH TWICE A DAY   furosemide (LASIX) 20 MG tablet Take 1 tablet (20 mg total) by mouth every Monday, Wednesday, and Friday.   gabapentin (NEURONTIN) 300 MG capsule TAKE 1 CAPSULE BY MOUTH TWICE A DAY FOR CHRONIC PAIN   hydrOXYzine (ATARAX) 50 MG tablet TAKE 1 TO 2  TABLETS 1 TO 2 HOURS BEFORE BEDTIME AS NEEDED FOR SLEEP   ipratropium (ATROVENT) 0.03 % nasal spray PLACE 2 SPRAYS INTO THE NOSE EVERY MONDAY, TUESDAY, WEDNESDAY, THURSDAY, AND FRIDAY.   ketoconazole (NIZORAL) 2 % shampoo USE AS DIRECTED TWICE A WEEK   levothyroxine (SYNTHROID) 50 MCG tablet TAKE 1/2-1 TABLET DAILY ON EMPTY STOMACH W/ WATER FOR 30 MINS NO ANTACID/MEDS/CALCIUM/MAG. X 4 HOURS (Patient taking differently: Take 50 mcg by mouth daily before  breakfast.)   losartan (COZAAR) 50 MG tablet Take 1 tablet (50 mg total) by mouth daily.   meclizine (ANTIVERT) 25 MG tablet 1/2-1 pill up to 3 times daily for motion sickness/dizziness. Caution, may cause drowsiness. (Patient taking differently: Take 25 mg by mouth 3 (three) times daily as needed for dizziness.)   metoprolol succinate (TOPROL XL) 25 MG 24 hr tablet Take 1 tablet (25 mg total) by mouth daily.   mometasone (NASONEX) 50 MCG/ACT nasal spray Use 1-2 sprays  to each nostril 1 to 2 x /day (Patient taking differently: Place 1-2 sprays into the nose 2 (two) times daily as needed (allergies).)   omeprazole (PRILOSEC) 40 MG capsule Take one capsule twice daily (Patient taking differently: Take 40 mg by mouth 2 (two) times daily.)   ondansetron (ZOFRAN-ODT) 4 MG disintegrating tablet Take 1 tablet (4 mg total) by mouth every 8 (eight) hours as needed for up to 15 doses for nausea or vomiting.   prochlorperazine (COMPAZINE) 5 MG tablet TAKE 1/2 TO 1 TABLET 3 TIMES A DAY TO PREVENT VERTIGO & NAUSEA   rosuvastatin (CRESTOR) 40 MG tablet TAKE 1 TABLET BY MOUTH EVERY DAY, TAKE 2 TABLETS BY MOUTH ON MONDAYS   Semaglutide,0.25 or 0.'5MG'$ /DOS, 2 MG/3ML SOPN Inject 0.5 mg into the skin once a week.   traMADol (ULTRAM) 50 MG tablet Take 1 tablet every 4 hours as needed for severe pain    Allergies  Allergen Reactions   Requip [Ropinirole Hcl] Shortness Of Breath and Nausea And Vomiting   Minocycline Hives   Tetracyclines & Related Hives   Zestril [Lisinopril] Cough   Zetia [Ezetimibe] Other (See Comments)    MYALGIA JOINT PAIN   Codeine Other (See Comments)    fatigue   Morphine Sulfate Palpitations   Spironolactone Rash   Sulfa Antibiotics Other (See Comments)    Headache (pt states that a blood pressure medicine caused a severe headache but doesn't remember a reaction to sulfa)    SOCIAL HISTORY/FAMILY HISTORY   Reviewed in Epic:  Pertinent findings:  Social History   Tobacco Use    Smoking status: Never   Smokeless tobacco: Never  Vaping Use   Vaping Use: Never used  Substance Use Topics   Alcohol use: Yes    Comment: wine occasionally-rarely   Drug use: No   Social History   Social History Narrative   Not on file    OBJCTIVE -PE, EKG, labs   Wt Readings from Last 3 Encounters:  10/15/21 214 lb (97.1 kg)  08/31/21 220 lb 9.6 oz (100.1 kg)  08/16/21 224 lb 6.4 oz (101.8 kg)  January 2022: Weight 232 pounds.  Physical Exam: BP 117/78   Pulse 75   Ht '5\' 5"'$  (1.651 m)   Wt 214 lb (97.1 kg)   SpO2 97%   BMI 35.61 kg/m  Physical Exam Vitals reviewed.  Constitutional:      General: She is not in acute distress.    Appearance: Normal appearance. She is obese. She is not ill-appearing (Seems  a little worn out), toxic-appearing or diaphoretic.     Comments: Technically obese but notably less that she has been.  Seems much healthier.  HENT:     Head: Normocephalic and atraumatic.  Neck:     Vascular: No carotid bruit, hepatojugular reflux or JVD.  Cardiovascular:     Rate and Rhythm: Normal rate and regular rhythm. No extrasystoles are present.    Chest Wall: PMI is not displaced.     Pulses: Normal pulses.     Heart sounds: Heart sounds are distant. No murmur heard.    No gallop. No S4 (Sounds more like a split S2) sounds.     Comments: Normal S1 with ~ split S2. Pulmonary:     Effort: Pulmonary effort is normal. No respiratory distress.     Breath sounds: Normal breath sounds. No wheezing, rhonchi or rales.  Chest:     Chest wall: No tenderness.  Musculoskeletal:        General: No swelling (Trivial at best). Normal range of motion.     Cervical back: Normal range of motion and neck supple.  Skin:    General: Skin is warm and dry.  Neurological:     General: No focal deficit present.     Mental Status: She is alert and oriented to person, place, and time. Mental status is at baseline.     Motor: No weakness.     Gait: Gait normal.   Psychiatric:        Mood and Affect: Mood normal.        Behavior: Behavior normal.        Thought Content: Thought content normal.        Judgment: Judgment normal.     Adult ECG Report  Rate: 75;  Rhythm: normal sinus rhythm and stable LBBB; otherwise normal axis, intervals and durations. ;   Narrative Interpretation: Stable  Recent Labs: Reviewed Lab Results  Component Value Date   CHOL 116 08/31/2021   HDL 45 (L) 08/31/2021   LDLCALC 47 08/31/2021   TRIG 160 (H) 08/31/2021   CHOLHDL 2.6 08/31/2021   Lab Results  Component Value Date   CREATININE 1.14 (H) 08/31/2021   BUN 21 08/31/2021   NA 137 08/31/2021   K 4.5 08/31/2021   CL 103 08/31/2021   CO2 24 08/31/2021      Latest Ref Rng & Units 08/31/2021    2:33 PM 08/09/2021    4:07 PM 08/02/2021   12:28 PM  CBC  WBC 3.8 - 10.8 Thousand/uL 9.4  10.5  9.7   Hemoglobin 11.7 - 15.5 g/dL 10.7  11.5  12.0   Hematocrit 35.0 - 45.0 % 33.6  35.6  37.2   Platelets 140 - 400 Thousand/uL 292  299  306    Lab Results  Component Value Date   HGBA1C 6.2 (H) 08/31/2021   ==================================================  COVID-19 Education: The signs and symptoms of COVID-19 were discussed with the patient and how to seek care for testing (follow up with PCP or arrange E-visit).    I spent a total of 19 minutes with the patient spent in direct patient consultation.  Additional time spent with chart review  / charting (studies, outside notes, etc): 25 min Total Time: 44  min  Current medicines are reviewed at length with the patient today.  (+/- concerns) noted changes to several medications including Lasix, Toprol and losartan. => See Plan.  Notice: This dictation was prepared with Dragon dictation along with smart  Company secretary. Any transcriptional errors that result from this process are unintentional and may not be corrected upon review.  Studies Ordered:   Orders Placed This Encounter  Procedures   EKG 12-Lead    No orders of the defined types were placed in this encounter.   Patient Instructions / Medication Changes & Studies & Tests Ordered   Patient Instructions  Medication Instructions:   No changes  *If you need a refill on your cardiac medications before your next appointment, please call your pharmacy*   Lab Work:  Not needed   Testing/Procedures:  Not needed  Follow-Up: At [facility], you and your health needs are our priority.  As part of our continuing mission to provide you with exceptional heart care, we have created designated Provider Care Teams.  These Care Teams include your primary Cardiologist (physician) and Advanced Practice Providers (APPs -  Physician Assistants and Nurse Practitioners) who all work together to provide you with the care you need, when you need it.     Your next appointment:   6 to 8 month(s) Jan to March 2024  The format for your next appointment:   In Person  Provider:   Glenetta Hew, MD       [name] [name], M.D., M.S. Interventional Cardiologist   Pager # [phone] Phone # [phone] [address] [place], [place] [address]   Thank you for choosing Heartcare at [facility]!!

## 2021-10-15 NOTE — Assessment & Plan Note (Signed)
Blood pressure is doing well.  She has been having issues with hypotension previously we have therefore backed down on her losartan to 50 mg and Toprol 25 mg.  She is doing well with no major issues since that change.  Blood pressures look good.

## 2021-10-15 NOTE — Patient Instructions (Addendum)
Medication Instructions:   No changes  *If you need a refill on your cardiac medications before your next appointment, please call your pharmacy*   Lab Work:  Not needed   Testing/Procedures:  Not needed  Follow-Up: At [facility], you and your health needs are our priority.  As part of our continuing mission to provide you with exceptional heart care, we have created designated Provider Care Teams.  These Care Teams include your primary Cardiologist (physician) and Advanced Practice Providers (APPs -  Physician Assistants and Nurse Practitioners) who all work together to provide you with the care you need, when you need it.     Your next appointment:   6 to 8 month(s) Jan to March 2024  The format for your next appointment:   In Person  Provider:   Glenetta Hew, MD

## 2021-10-15 NOTE — Assessment & Plan Note (Signed)
Euvolemic.  Stable moderate dose ARB and low-dose beta-blocker with 3 days a week furosemide for maintenance.  Not requiring additional.  Dosing.  NYHA class I-II symptoms, notably improved as she continues to lose weight

## 2021-10-15 NOTE — Assessment & Plan Note (Signed)
Still losing weight, it seems that this is all revolve around her starting Ozempic.  Regardless of the reason, she is losing weight and it is intentional.  Feeling much better.  Energy level picking up as she loses weight.  Also try to increase exercise level.

## 2021-10-15 NOTE — Assessment & Plan Note (Signed)
Longstanding finding.  Nonischemic, however I think the combination of A-fib and LBBB led to her cardiomyopathy.  Continue to monitor, for now no indication for any further procedures or evaluations.

## 2021-10-15 NOTE — Assessment & Plan Note (Signed)
Essentially resolved probably related to combination of A-fib and LBBB.  Trivial HFpEF.  Now down to just taking furosemide 3 days a week for maintenance.  Has not required any additional dosing.  With significant weight loss, her symptomatology is notably improved.

## 2021-10-15 NOTE — Assessment & Plan Note (Signed)
Excellent lipids on recent check.  LDL 47 on current dose of rosuvastatin.  Tolerating relatively well.  She is taking 2 tablets on Monday and 1 tablet every other day.  I think she can probably just stick with taking 1 tablet daily.

## 2021-10-26 ENCOUNTER — Other Ambulatory Visit: Payer: Self-pay | Admitting: Nurse Practitioner

## 2021-10-27 ENCOUNTER — Other Ambulatory Visit: Payer: Self-pay | Admitting: Adult Health

## 2021-11-11 ENCOUNTER — Other Ambulatory Visit: Payer: Self-pay | Admitting: Cardiology

## 2021-11-12 ENCOUNTER — Ambulatory Visit
Admission: RE | Admit: 2021-11-12 | Discharge: 2021-11-12 | Disposition: A | Discharge status: Home / Self Care | Payer: Medicare Other | Source: Ambulatory Visit | Attending: Nurse Practitioner | Admitting: Nurse Practitioner

## 2021-11-12 DIAGNOSIS — Z780 Asymptomatic menopausal state: Secondary | ICD-10-CM

## 2021-11-13 NOTE — Progress Notes (Signed)
<><><><><><><><><><><><><><><><><><><><><><><><><><><><><><><><><> <><><><><><><><><><><><><><><><><><><><><><><><><><><><><><><><><>  -   Bone Density Shows Osteopenia,  - Recommend                           Supplemental OTC Calcium 500-600 mg Daily                            Exercise                            Vitamin  D   _ last level was great , So please continue dose same   <><><><><><><><><><><><><><><><><><><><><><><><><><><><><><><><><> <><><><><><><><><><><><><><><><><><><><><><><><><><><><><><><><><>

## 2021-11-19 ENCOUNTER — Other Ambulatory Visit: Payer: Self-pay | Admitting: Nurse Practitioner

## 2021-11-25 ENCOUNTER — Other Ambulatory Visit: Payer: Self-pay | Admitting: Internal Medicine

## 2021-11-25 DIAGNOSIS — F5101 Primary insomnia: Secondary | ICD-10-CM

## 2021-11-29 ENCOUNTER — Telehealth: Payer: Self-pay | Admitting: Nurse Practitioner

## 2021-11-29 ENCOUNTER — Other Ambulatory Visit: Payer: Self-pay | Admitting: Nurse Practitioner

## 2021-11-29 DIAGNOSIS — R7309 Other abnormal glucose: Secondary | ICD-10-CM

## 2021-11-29 MED ORDER — SEMAGLUTIDE (1 MG/DOSE) 4 MG/3ML ~~LOC~~ SOPN: 1.0000 mg | PEN_INJECTOR | SUBCUTANEOUS | 3 refills | Status: DC

## 2021-11-29 NOTE — Telephone Encounter (Signed)
Patient is requesting a refill on her Ozempic and states that she is ready to increase to the next dose.

## 2021-12-18 ENCOUNTER — Other Ambulatory Visit: Payer: Self-pay | Admitting: Cardiology

## 2021-12-18 ENCOUNTER — Other Ambulatory Visit: Payer: Self-pay | Admitting: Internal Medicine

## 2021-12-18 ENCOUNTER — Other Ambulatory Visit: Payer: Self-pay | Admitting: Nurse Practitioner

## 2021-12-18 DIAGNOSIS — F5101 Primary insomnia: Secondary | ICD-10-CM

## 2021-12-20 ENCOUNTER — Other Ambulatory Visit: Payer: Self-pay | Admitting: Internal Medicine

## 2021-12-20 DIAGNOSIS — F5101 Primary insomnia: Secondary | ICD-10-CM

## 2021-12-29 ENCOUNTER — Other Ambulatory Visit: Payer: Self-pay | Admitting: Internal Medicine

## 2021-12-29 DIAGNOSIS — R7309 Other abnormal glucose: Secondary | ICD-10-CM

## 2021-12-29 MED ORDER — SEMAGLUTIDE (1 MG/DOSE) 4 MG/3ML ~~LOC~~ SOPN: 1.0000 mg | PEN_INJECTOR | SUBCUTANEOUS | 3 refills | Status: DC

## 2022-01-26 ENCOUNTER — Other Ambulatory Visit: Payer: Self-pay | Admitting: Cardiology

## 2022-01-26 ENCOUNTER — Other Ambulatory Visit: Payer: Self-pay | Admitting: Internal Medicine

## 2022-01-26 ENCOUNTER — Other Ambulatory Visit: Payer: Self-pay | Admitting: Nurse Practitioner

## 2022-01-26 DIAGNOSIS — F5101 Primary insomnia: Secondary | ICD-10-CM

## 2022-02-24 NOTE — Progress Notes (Signed)
COMPLETE PHYSICAL  Assessment / Plan:  Encounter for general adult medical examination with abnormal findings Due yearly  Essential hypertension - continue medications, DASH diet, exercise and monitor at home. Call if greater than 130/80.   -     CBC with Differential/Platelet -     CMP/GFR -     TSH - EKG  Paroxysmal atrial fibrillation [facility]): CHA2DS2-VASc Score - starting Eliquis Control blood pressure, cholesterol, glucose, increase exercise.  Continue cardio follow up  Chronic combined systolic and diastolic heart failure, NYHA class 2 ([place]) -- exacerbated by A. Fib Weight stable, appears euvoluemic, followed by cardiology EKG  Non-ischemic cardiomyopathy ([place]) Weight stable at home Optimize meds  Paroxysmal supraventricular tachycardia (HCC) Control blood pressure, cholesterol, glucose, increase exercise.  Continue cardio follow up  Non-seasonal allergic rhinitis, unspecified trigger - Allegra OTC, increase H20, allergy hygiene explained.  Gastroesophageal reflux disease without esophagitis Continue PPI/H2 blocker, diet discussed Magnesium  Other specified hypothyroidism Hypothyroidism-check TSH level, continue medications the same, reminded to take on an empty stomach 30-109mns before food.  -     TSH  Lumbar radiculopathy Follow up ortho/pain management   DDD (degenerative disc disease), lumbar Follow up ortho  Hyperlipidemia -continue medications, check lipids, decrease fatty foods, increase activity.  -     Lipid panel  H/O left bundle branch block Continue cardio follow up  ANXIETY DEPRESSION Continue meds  History of pancreatitis Monitor, no ETOH  Hx of  -  Malignant neoplasm of lower-inner quadrant of left breast in female, estrogen receptor positive  S/p lumpectomy and 5 year anastrozole, has been released Monitoring by annual mammograms   Morbid Obesity with co morbidities ([place]  - long discussion about weight loss, diet, and exercise    Abnormal Glucose Continue Ozempic Discussed general issues about diabetes pathophysiology and management., Educational material distributed., Suggested low cholesterol diet., Encouraged aerobic exercise., Discussed foot care., Reminded to get yearly retinal exam. -     Hemoglobin A1c  Vitamin D deficiency At goal at recent check; continue to recommend supplementation for goal of 60-100 Check Vitamin D level  History of ulcer disease Monitor, avoid NSAIDS, continue GERD meds  CKD Stage 2 GFR 60-89 Increase fluids, avoid NSAIDS, monitor sugars, will monitor Routine urine with microscopic reflex Microalbumin/ creatinine urine ratio  Anemia Chronic, stable, monitor. UTD on colonoscopy. - CBC  Screening for ischemic heart disease - EKG  Screening for hematuria/proteinuria - Urine with microscopic and reflex culture - Microalbumin/creatinine urine ratio  Screening for AAA - U/S ABD Retroperitoneal LTD  Dizziness Change position slowly Monitor BP CBC, Iron total and TIBC, ferritin If labs are stable will refer to neurology for further evaluation- relates to beginning after cervical fusion   Further disposition pending results if labs check today. Discussed med's effects and SE's.   Over 40 minutes of face to face interview, exam, counseling, chart review, and critical decision making was performed.   Future Appointments  Date Time Provider [place] 06/01/2022  3:00 PM WAlycia Rossetti NP [facility] None  03/02/2023  2:00 PM WAlycia Rossetti NP [facility] None      Subjective:  [name] BUCCELLATOis a 76y.o. female who presents for complete physical.   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   She has hx of R breast cancer in 2015, s/p lumpectomy, completed 5 years of anastrozole and was released by Dr. MJana Hakim now following with annual mammograms. Last mammogram 07/02/21  She previously followed with Dr.  Wong(pain management) for cervical and  lower back pain with radiculopathy and previously got epidural injections but did not relieve pain. She also has knee pain seen by Dr. [name]. [facility]. Had done PT previously which did help somewhat.  Pain is the worst when she uses her left arm.   she has a diagnosis of depression/anxiety and is currently on wellbutrin 150 mg, cymbalta 60 mg, reports symptoms are typically well controlled with this regimen.she also takes hydroxyzine for insomnia.  she has a diagnosis of GERD with history of ulcer, which is currently managed by omeprazole 40 mg and famotidine PRN breakthrough.   She had cervical fusion 09/24/20 with Dr. Lynann Bologna and since that time has been experiencing dizziness. Dizziness occurs when going from lying down to sitting. It will sometimes resolve with sitting but sometimes it persists. Occurs intermittently and Meclizine does help.   BMI is Body mass index is 33.55 kg/m., she has not been working on diet and exercise, works all day on her feet with walking and lifting at [employer]. She is down 13 pounds in the past 4 months.  Wt Readings from Last 3 Encounters:  02/28/22 201 lb 9.6 oz (91.4 kg)  10/15/21 214 lb (97.1 kg)  08/31/21 220 lb 9.6 oz (100.1 kg)   She has hx of a. Fib/PSVT, on elequis, non-ischemic cardiomyopathy, diastolic CHF improved on ARB/BB, most recent ECHO with EF 60-65%, followed by cardiology Dr. Ellyn Hack.   Had recent benign holter results in Feb 2021.   Her blood pressure has been controlled at home on Losartan 50 mg and metoprolol 25 mg Qd.  She also takes Lasix 20 mg BID and Klor-Con 20 meq qd, today their BP is BP: 118/62 BP Readings from Last 3 Encounters:  02/28/22 118/62  10/15/21 117/78  08/31/21 118/68    She does not workout. She denies chest pain, shortness of breath, dizziness.   She is on cholesterol medication (rosuvastatin 40 mg daily) and denies myalgias. Her cholesterol is at goal. The cholesterol last visit was:   Lab Results   Component Value Date   CHOL 116 08/31/2021   HDL 45 (L) 08/31/2021   LDLCALC 47 08/31/2021   TRIG 160 (H) 08/31/2021   CHOLHDL 2.6 08/31/2021    She has not been working on diet and exercise for prediabetes, and denies foot ulcerations, increased appetite, nausea, paresthesia of the feet, polydipsia, polyuria, visual disturbances, vomiting and weight loss.She is currently on Ozempic 1 mg SQ QW.  Last A1C in the office was:  Lab Results  Component Value Date   HGBA1C 6.2 (H) 08/31/2021   She has CKD III monitored at this office. She is drinking water and has been trying to get at least 32 ounces and sometimes 48 ounces.  Last GFR:  Lab Results  Component Value Date   GFRNONAA 38 (L) 08/02/2021   Patient is on Vitamin D supplement.   Lab Results  Component Value Date   VD25OH 62 08/31/2021       Medication Review: Current Outpatient Medications on File Prior to Visit  Medication Sig Dispense Refill   acetaminophen (TYLENOL) 500 MG tablet Take 1,000 mg by mouth every 6 (six) hours as needed for moderate pain.     buPROPion (WELLBUTRIN XL) 150 MG 24 hr tablet TAKE 1 TABLET BY MOUTH EVERY DAY IN THE MORNING (Patient taking differently: Take 150 mg by mouth daily.) 90 tablet 1   Cholecalciferol (VITAMIN D3) 250 MCG (10000 UT) TABS Take 5,000 Units  by mouth daily.     COLACE 100 MG capsule Take 100 mg by mouth 2 (two) times daily.     [facility] 12 HOUR NASAL DECONGESTANT 120 MG 12 hr tablet TAKE 1 TABLET BY MOUTH TWICE DAILY (EVERY 12 HOURS) FOR HEAD/CHEST CONGESTION & EAR FLUID 60 tablet 3   DULoxetine (CYMBALTA) 30 MG capsule TAKE 1 CAPSULE BY MOUTH 3 TIMES A DAY 270 capsule 3   ELIQUIS 5 MG TABS tablet TAKE 1 TABLET BY MOUTH TWICE A DAY 180 tablet 3   furosemide (LASIX) 20 MG tablet TAKE 1 TABLET BY MOUTH TWICE A DAY 180 tablet 1   gabapentin (NEURONTIN) 300 MG capsule TAKE 1 CAPSULE BY MOUTH TWICE A DAY FOR CHRONIC PAIN 180 capsule 1   hydrOXYzine (ATARAX) 50 MG tablet TAKE 1 TO 2  TABLETS 1 TO 2 HOURS BEFORE BEDTIME AS NEEDED FOR SLEEP 180 tablet 1   ipratropium (ATROVENT) 0.03 % nasal spray PLACE 2 SPRAYS INTO THE NOSE EVERY MONDAY, TUESDAY, WEDNESDAY, THURSDAY, AND FRIDAY. 90 mL 3   ketoconazole (NIZORAL) 2 % shampoo USE AS DIRECTED TWICE A WEEK 120 mL 0   levothyroxine (SYNTHROID) 50 MCG tablet TAKE 1/2-1 TABLET DAILY ON EMPTY STOMACH W/ WATER FOR 30 MINS NO ANTACID/MEDS/CALCIUM/MAG. X 4 HOURS 90 tablet 3   losartan (COZAAR) 50 MG tablet Take 1 tablet (50 mg total) by mouth daily. 90 tablet 3   meclizine (ANTIVERT) 25 MG tablet 1/2-1 pill up to 3 times daily for motion sickness/dizziness. Caution, may cause drowsiness. (Patient taking differently: Take 25 mg by mouth 3 (three) times daily as needed for dizziness.) 60 tablet 0   metoprolol succinate (TOPROL XL) 25 MG 24 hr tablet Take 1 tablet (25 mg total) by mouth daily. 90 tablet 3   mometasone (NASONEX) 50 MCG/ACT nasal spray Use 1-2 sprays  to each nostril 1 to 2 x /day (Patient taking differently: Place 1-2 sprays into the nose 2 (two) times daily as needed (allergies).) 51 g 3   omeprazole (PRILOSEC) 40 MG capsule Take  1 capsule  2 x /day to Prevent Heartburn & Indigestion                                    /                     TAKE                           BY                        MOUTH 180 capsule 3   ondansetron (ZOFRAN-ODT) 4 MG disintegrating tablet Take 1 tablet (4 mg total) by mouth every 8 (eight) hours as needed for up to 15 doses for nausea or vomiting. 15 tablet 0   potassium chloride SA (KLOR-CON M20) 20 MEQ tablet TAKE ONE TABLET DAILY OR AS DIRECTED BY YOUR PROVIDER. (Patient taking differently: Take 20 mEq by mouth daily.) 90 tablet 1   prochlorperazine (COMPAZINE) 5 MG tablet TAKE 1/2 TO 1 TABLET 3 TIMES A DAY TO PREVENT VERTIGO & NAUSEA 50 tablet 1   rosuvastatin (CRESTOR) 40 MG tablet TAKE 1 TABLET BY MOUTH EVERY DAY EXCEPT TAKE 2 TABLETS BY MOUTH ON MONDAYS 102 tablet 2   Semaglutide, 1 MG/DOSE, 4  MG/3ML SOPN Inject 1  mg into the skin once a week. 9 mL 3   traMADol (ULTRAM) 50 MG tablet Take 1 tablet every 4 hours as needed for severe pain 30 tablet 0   traZODone (DESYREL) 150 MG tablet TAKE 1/2 TO 1 OR 2 TABLETS 1 TO 2 HOURS BEFORE BEDTIME AS NEEDED FOR SLEEP 180 tablet 1   No current facility-administered medications on file prior to visit.    Allergies  Allergen Reactions   Requip [Ropinirole Hcl] Shortness Of Breath and Nausea And Vomiting   Minocycline Hives   Tetracyclines & Related Hives   Zestril [Lisinopril] Cough   Zetia [Ezetimibe] Other (See Comments)    MYALGIA JOINT PAIN   Codeine Other (See Comments)    fatigue   Morphine Sulfate Palpitations   Spironolactone Rash   Sulfa Antibiotics Other (See Comments)    Headache (pt states that a blood pressure medicine caused a severe headache but doesn't remember a reaction to sulfa)    Current Problems (verified) Patient Active Problem List   Diagnosis Date Noted   History of breast cancer 09/06/2019   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 04/30/2019   Bradycardia    History of COVID-19    CKD (chronic kidney disease) stage 3, GFR 30-59 ml/min (HCC) 09/21/2018   Severe obesity with body mass index (BMI) of 35.0 to 39.9 with comorbidity ([place]) 06/14/2017   Primary osteoarthritis of left knee 09/23/2016   Non-ischemic cardiomyopathy ([place])    (HFpEF) heart failure with preserved ejection fraction ([place]) 08/08/2015   Paroxysmal atrial fibrillation ([place]): CHA2DS2-VASc Score  4 ->  Eliquis 08/06/2015   Hypothyroidism 08/06/2015   Severe anemia    DDD (degenerative disc disease), lumbar 06/16/2014   Abnormal glucose 12/18/2013   Vitamin D deficiency 12/18/2013   Hyperlipidemia, mixed 03/13/2008   Major depressive disorder, recurrent episode, in partial remission ([place]) 03/13/2008   Essential hypertension 03/13/2008   H/O left bundle branch block 03/13/2008   Allergic rhinitis 03/13/2008   GERD 03/13/2008    Screening  Tests Health Maintenance  Topic Date Due   Medicare Annual Wellness (AWV)  Never done   Zoster Vaccines- Shingrix (1 of 2) Never done   COVID-19 Vaccine (5 - Pfizer risk series) 06/23/2021   INFLUENZA VACCINE  11/09/2021   COLONOSCOPY (Pts 45-14yr Insurance coverage will need to be confirmed)  03/20/2023   Pneumonia Vaccine 76 Years old  Completed   DEXA SCAN  Completed   Hepatitis C Screening  Completed   HPV VACCINES  Aged Out    Immunization History  Administered Date(s) Administered   Fluad Quad(high Dose 65+) 01/15/2020   Influenza Whole 01/01/2013   Influenza, High Dose Seasonal PF 01/29/2014, 12/30/2014, 01/17/2019, 01/14/2021   Influenza,inj,Quad PF,6+ Mos 01/18/2018   Influenza-Unspecified 12/11/2015, 02/04/2017, 01/18/2018   PFIZER(Purple Top)SARS-COV-2 Vaccination 05/18/2019, 06/10/2019   Pneumococcal Conjugate-13 06/16/2014   Pneumococcal Polysaccharide-23 05/23/2011   Td 09/24/2018   Tdap 03/11/2008   Zoster, Live 02/02/2010   Preventative care: Last colonoscopy: 2014 refuses another Last mammogram: 07/2020 (05/06/2013 + right breast cancer s/p lumpectomy) Last pap smear/pelvic exam: remote  over 20 years ago DEXA: 05/2018 - L forearm T -1.4, otherwise in normal range Renal [place]05/2017 Echo 2020 Cath 2017   Prior vaccinations: TD or Tdap: 2020 Influenza: 01/14/2021 Pneumococcal: 2013 Prevnar 13: 2016 Shingles/Zostavax: 2011, insurance won't cover at this time, declines shingrix  Covid 19: 2/2, 2021, pfizer   Names of Other Physician/Practitioners you currently use: 1. [facility] here for primary care  2. Dr Nicki Reaper, eye doctor, last visit 2021 3. [facility], dentist, last visit 2022, q 6 months  Patient Care Team: Unk Pinto, MD as PCP - General (Internal Medicine) Leonie Man, MD as PCP - Cardiology (Cardiology) Magrinat, Virgie Dad, MD (Inactive) as Consulting Physician (Oncology) Leonie Man, MD as Consulting Physician (Cardiology) Dorna Leitz, MD as Consulting Physician (Orthopedic Surgery) Rolm Bookbinder, MD as Consulting Physician (General Surgery) Eppie Gibson, MD as Attending Physician (Radiation Oncology)  SURGICAL HISTORY She  has a past surgical history that includes Cataract extraction bilateral w/ anterior vitrectomy (Bilateral); Abdominal hysterectomy; Cholecystectomy; Knee arthroscopy (Bilateral); Tonsillectomy; Colonoscopy (2014); Upper gi endoscopy; Dupuytren / palmar fasciotomy (Bilateral, 2007); Tubal ligation; epidural injections; Lumbar fusion (10/2013); Cardiac catheterization (N/A, 08/10/2015); transthoracic echocardiogram (08/07/2015); transthoracic echocardiogram (10/2015); Joint replacement; Total knee arthroplasty (Left, 09/23/2016); Breast lumpectomy (Left); Foot Tendon Surgery (Right, ~ 11/2015); Total knee arthroplasty (Left, 09/23/2016); transthoracic echocardiogram (04/05/2019); Back surgery; Anterior cervical decomp/discectomy fusion (N/A, 09/24/2020); and Breast lumpectomy (Left). FAMILY HISTORY Her family history includes Breast cancer in her sister; COPD in her father; Heart attack in her mother; Hyperlipidemia in her sister; Hypertension in her father; Stroke in her mother. SOCIAL HISTORY She  reports that she has never smoked. She has never used smokeless tobacco. She reports current alcohol use. She reports that she does not use drugs.      Review of Systems  Constitutional:  Negative for malaise/fatigue and weight loss.  HENT:  Negative for hearing loss and tinnitus.   Eyes:  Negative for blurred vision and double vision.  Respiratory:  Negative for cough, sputum production, shortness of breath and wheezing.   Cardiovascular:  Negative for chest pain, palpitations, orthopnea, claudication, leg swelling and PND.  Gastrointestinal:  Negative for abdominal pain, blood in stool, constipation, diarrhea, heartburn, melena, nausea and  vomiting.  Genitourinary: Negative.  Negative for dysuria.  Musculoskeletal:  Positive for back pain. Negative for falls, joint pain and myalgias.  Skin:  Negative for rash.  Neurological:  Positive for dizziness. Negative for tingling, sensory change, weakness and headaches.  Endo/Heme/Allergies:  Negative for polydipsia.  Psychiatric/Behavioral: Negative.  Negative for depression, memory loss, substance abuse and suicidal ideas. The patient is not nervous/anxious and does not have insomnia.   All other systems reviewed and are negative.    Objective:     Today's Vitals   02/28/22 1410  BP: 118/62  Pulse: 96  Temp: 97.6 F (36.4 C)  SpO2: 97%  Weight: 201 lb 9.6 oz (91.4 kg)  Height: '5\' 5"'$  (1.651 m)   Body mass index is 33.55 kg/m.  General appearance: alert, no distress, WD/WN, female HEENT: normocephalic, sclerae anicteric, TMs pearly, nares patent, no discharge or erythema, pharynx normal Oral cavity: MMM, no lesions Neck: supple, no lymphadenopathy, no thyromegaly, no masses Heart: RRR, normal S1, S2, no murmurs  Lungs: CTA bilaterally, no wheezes, rhonchi, or rales Abdomen: +bs, soft, non tender, non distended, no masses, no hepatomegaly, no splenomegaly Musculoskeletal: nontender, no swelling, no obvious deformity Extremities: no edema, no cyanosis, no clubbing Pulses: 2+ symmetric, upper and lower extremities, normal cap refill Neurological: alert, oriented x 3, CN2-12 intact, strength normal upper extremities and lower extremities, sensation normal throughout, DTRs 2+ throughout, no cerebellar signs, gait normal Psychiatric: normal affect, behavior normal, pleasant    EKG: NSR, mild prolonged QT AAA: < 3 cm   [facility]  02/28/2022

## 2022-02-28 ENCOUNTER — Ambulatory Visit: Payer: Medicare Other | Admitting: Nurse Practitioner

## 2022-02-28 ENCOUNTER — Encounter: Payer: Self-pay | Admitting: Nurse Practitioner

## 2022-02-28 VITALS — BP 118/62 | HR 96 | Temp 97.6°F | Ht 65.0 in | Wt 201.6 lb

## 2022-02-28 DIAGNOSIS — Z0000 Encounter for general adult medical examination without abnormal findings: Secondary | ICD-10-CM | POA: Diagnosis not present

## 2022-02-28 DIAGNOSIS — J301 Allergic rhinitis due to pollen: Secondary | ICD-10-CM

## 2022-02-28 DIAGNOSIS — I480 Paroxysmal atrial fibrillation: Secondary | ICD-10-CM | POA: Diagnosis not present

## 2022-02-28 DIAGNOSIS — F3341 Major depressive disorder, recurrent, in partial remission: Secondary | ICD-10-CM

## 2022-02-28 DIAGNOSIS — E559 Vitamin D deficiency, unspecified: Secondary | ICD-10-CM

## 2022-02-28 DIAGNOSIS — Z0001 Encounter for general adult medical examination with abnormal findings: Secondary | ICD-10-CM

## 2022-02-28 DIAGNOSIS — Z1389 Encounter for screening for other disorder: Secondary | ICD-10-CM

## 2022-02-28 DIAGNOSIS — N1831 Chronic kidney disease, stage 3a: Secondary | ICD-10-CM

## 2022-02-28 DIAGNOSIS — I5042 Chronic combined systolic (congestive) and diastolic (congestive) heart failure: Secondary | ICD-10-CM

## 2022-02-28 DIAGNOSIS — I700 Atherosclerosis of aorta: Secondary | ICD-10-CM

## 2022-02-28 DIAGNOSIS — I428 Other cardiomyopathies: Secondary | ICD-10-CM

## 2022-02-28 DIAGNOSIS — C50312 Malignant neoplasm of lower-inner quadrant of left female breast: Secondary | ICD-10-CM

## 2022-02-28 DIAGNOSIS — M5136 Other intervertebral disc degeneration, lumbar region: Secondary | ICD-10-CM

## 2022-02-28 DIAGNOSIS — Z136 Encounter for screening for cardiovascular disorders: Secondary | ICD-10-CM | POA: Diagnosis not present

## 2022-02-28 DIAGNOSIS — M8580 Other specified disorders of bone density and structure, unspecified site: Secondary | ICD-10-CM

## 2022-02-28 DIAGNOSIS — M5416 Radiculopathy, lumbar region: Secondary | ICD-10-CM

## 2022-02-28 DIAGNOSIS — I471 Supraventricular tachycardia, unspecified: Secondary | ICD-10-CM

## 2022-02-28 DIAGNOSIS — R7309 Other abnormal glucose: Secondary | ICD-10-CM

## 2022-02-28 DIAGNOSIS — R42 Dizziness and giddiness: Secondary | ICD-10-CM

## 2022-02-28 DIAGNOSIS — K219 Gastro-esophageal reflux disease without esophagitis: Secondary | ICD-10-CM

## 2022-02-28 DIAGNOSIS — I10 Essential (primary) hypertension: Secondary | ICD-10-CM

## 2022-02-28 DIAGNOSIS — Z79899 Other long term (current) drug therapy: Secondary | ICD-10-CM

## 2022-02-28 DIAGNOSIS — E039 Hypothyroidism, unspecified: Secondary | ICD-10-CM

## 2022-02-28 DIAGNOSIS — D649 Anemia, unspecified: Secondary | ICD-10-CM

## 2022-02-28 DIAGNOSIS — E782 Mixed hyperlipidemia: Secondary | ICD-10-CM

## 2022-02-28 NOTE — Patient Instructions (Signed)

## 2022-03-01 ENCOUNTER — Other Ambulatory Visit: Payer: Self-pay | Admitting: Nurse Practitioner

## 2022-03-01 DIAGNOSIS — N1831 Chronic kidney disease, stage 3a: Secondary | ICD-10-CM

## 2022-03-02 LAB — COMPLETE METABOLIC PANEL WITH GFR
AG Ratio: 1.5 (calc) (ref 1.0–2.5)
ALT: 7 U/L (ref 6–29)
AST: 13 U/L (ref 10–35)
Albumin: 4.1 g/dL (ref 3.6–5.1)
Alkaline phosphatase (APISO): 82 U/L (ref 37–153)
BUN/Creatinine Ratio: 12 (calc) (ref 6–22)
BUN: 19 mg/dL (ref 7–25)
CO2: 23 mmol/L (ref 20–32)
Calcium: 9.6 mg/dL (ref 8.6–10.4)
Chloride: 103 mmol/L (ref 98–110)
Creat: 1.56 mg/dL — ABNORMAL HIGH (ref 0.60–1.00)
Globulin: 2.7 g/dL (calc) (ref 1.9–3.7)
Glucose, Bld: 85 mg/dL (ref 65–99)
Potassium: 4.1 mmol/L (ref 3.5–5.3)
Sodium: 138 mmol/L (ref 135–146)
Total Bilirubin: 0.3 mg/dL (ref 0.2–1.2)
Total Protein: 6.8 g/dL (ref 6.1–8.1)
eGFR: 34 mL/min/{1.73_m2} — ABNORMAL LOW (ref >=60)

## 2022-03-02 LAB — URINALYSIS W MICROSCOPIC + REFLEX CULTURE
Bilirubin Urine: NEGATIVE
Glucose, UA: NEGATIVE
Hgb urine dipstick: NEGATIVE
Nitrites, Initial: NEGATIVE
Specific Gravity, Urine: 1.022 (ref 1.001–1.035)
pH: <=5 (ref 5.0–8.0)

## 2022-03-02 LAB — LIPID PANEL
Cholesterol: 133 mg/dL (ref <200)
HDL: 50 mg/dL (ref >=50)
LDL Cholesterol (Calc): 62 mg/dL (calc)
Non-HDL Cholesterol (Calc): 83 mg/dL (calc) (ref <130)
Total CHOL/HDL Ratio: 2.7 (calc) (ref <5.0)
Triglycerides: 126 mg/dL (ref <150)

## 2022-03-02 LAB — CBC WITH DIFFERENTIAL/PLATELET
Absolute Monocytes: 1061 cells/uL — ABNORMAL HIGH (ref 200–950)
Basophils Absolute: 125 cells/uL (ref 0–200)
Basophils Relative: 1.2 %
Eosinophils Absolute: 229 cells/uL (ref 15–500)
Eosinophils Relative: 2.2 %
HCT: 34.2 % — ABNORMAL LOW (ref 35.0–45.0)
Hemoglobin: 11.4 g/dL — ABNORMAL LOW (ref 11.7–15.5)
Lymphs Abs: 1664 cells/uL (ref 850–3900)
MCH: 28.7 pg (ref 27.0–33.0)
MCHC: 33.3 g/dL (ref 32.0–36.0)
MCV: 86.1 fL (ref 80.0–100.0)
MPV: 11.2 fL (ref 7.5–12.5)
Monocytes Relative: 10.2 %
Neutro Abs: 7322 cells/uL (ref 1500–7800)
Neutrophils Relative %: 70.4 %
Platelets: 362 10*3/uL (ref 140–400)
RBC: 3.97 10*6/uL (ref 3.80–5.10)
RDW: 13.6 % (ref 11.0–15.0)
Total Lymphocyte: 16 %
WBC: 10.4 10*3/uL (ref 3.8–10.8)

## 2022-03-02 LAB — MICROALBUMIN / CREATININE URINE RATIO
Creatinine, Urine: 259 mg/dL (ref 20–275)
Microalb Creat Ratio: 36 mcg/mg creat — ABNORMAL HIGH (ref <30)
Microalb, Ur: 9.2 mg/dL

## 2022-03-02 LAB — MAGNESIUM: Magnesium: 1.9 mg/dL (ref 1.5–2.5)

## 2022-03-02 LAB — HEMOGLOBIN A1C
Hgb A1c MFr Bld: 5.9 % of total Hgb — ABNORMAL HIGH (ref <5.7)
Mean Plasma Glucose: 123 mg/dL
eAG (mmol/L): 6.8 mmol/L

## 2022-03-02 LAB — URINE CULTURE
MICRO NUMBER:: 14217843
SPECIMEN QUALITY:: ADEQUATE

## 2022-03-02 LAB — CULTURE INDICATED

## 2022-03-02 LAB — VITAMIN D 25 HYDROXY (VIT D DEFICIENCY, FRACTURES): Vit D, 25-Hydroxy: 64 ng/mL (ref 30–100)

## 2022-03-02 LAB — TSH: TSH: 1.62 mIU/L (ref 0.40–4.50)

## 2022-03-15 NOTE — Progress Notes (Unsigned)
Assessment and Plan:  There are no diagnoses linked to this encounter.  1. Stage 3a chronic kidney disease ([place]) Discussed how what you eat and drink can aide in kidney protection. Stay well hydrated. Avoid high salt foods. Avoid NSAIDS. Keep BP and BG well controlled.   Take medications as prescribed. Remain active and exercise as tolerated daily. Maintain weight.  Continue to monitor. Check CMP/GFR/Microablumin  - COMPLETE METABOLIC PANEL WITH GFR  2. Candidiasis of skin Keep area free of moisture Suggest Interdry pads from [employer]. Notify office is no response to treatment or s/s fail to improve.  - nystatin cream (MYCOSTATIN); Apply 1 Application topically 2 (two) times daily.  Dispense: 30 g; Refill: 2  3. Erythema Monitor for any spreading of redness. Notify office  4. Pruritus Refrain from scratching to reduce risk of increased infection.   Notify office for further evaluation and treatment, questions or concerns if any reported s/s fail to improve.   The patient was advised to call back or seek an in-person evaluation if any symptoms worsen or if the condition fails to improve as anticipated.   Further disposition pending results of labs. Discussed med's effects and SE's.    I discussed the assessment and treatment plan with the patient. The patient was provided an opportunity to ask questions and all were answered. The patient agreed with the plan and demonstrated an understanding of the instructions.  Discussed med's effects and SE's. Screening labs and tests as requested with regular follow-up as recommended.  I provided 15 minutes of face-to-face time during this encounter including counseling, chart review, and critical decision making was preformed.   Future Appointments  Date Time Provider [place]  06/02/2022  2:30 PM Alycia Rossetti, NP [facility] None  03/02/2023  2:00 PM Alycia Rossetti, NP [facility] None     ------------------------------------------------------------------------------------------------------------------   HPI BP 130/70   Pulse 78   Temp (!) 97.1 F (36.2 C)   Ht '5\' 5"'$  (1.651 m)   Wt 202 lb (91.6 kg)   SpO2 95%   BMI 33.61 kg/m    She is on medications as noted and overall her rash has improved in some ways and worsened in others. Her rash affects  groin . She has been experiencing itching and pain with light touch. Overall, symptoms have been moderate. She has tried Betamethasone cream without relief. Denies fever, chills, N/V.   Past Medical History:  Diagnosis Date   Anemia    Anxiety    Arthritis    Chronic back pain    cyst sitting on L4-5;slipped disc   Chronic heart failure with preserved ejection fraction (HFpEF) ([place]) 03/2019   EF improved from 25 and 30% back to 50 to 65% with GR 1 DD.   Depression    takes Cymbalta daily   Diverticulosis    GERD (gastroesophageal reflux disease)    takes Omeprazole daily   Headache(784.0)    rare   History of pancreatitis 03/27/2008   History of ulcer disease 08/06/2015   Hyperlipidemia    Hypertension    takes Cardura nightly and Verapamil daily   Hypothyroidism    Insomnia    takes Restoril nightly   Malignant neoplasm of lower-inner quadrant of left breast in female, estrogen receptor positive ([place]) 05/20/2013   L Breast Cancer   Obesity (BMI 30-39.9) 06/11/2013   Paroxysmal atrial fibrillation ([place]): CHA2DS2-VASc Score - starting Eliquis 08/06/2015   This patients CHA2DS2-VASc Score and unadjusted Ischemic Stroke Rate (% per  year) is equal to 4.8 % stroke rate/year from a score of 4  Above score calculated as 1 point each if present [CHF, HTN, DM, Vascular=MI/PAD/Aortic Plaque, Age if 65-74, or Female]; 2 points each if present [Age > 75, or Stroke/TIA/TE]   Personal history of radiation therapy    2015   Pneumonia due to COVID-19 virus 04/05/2019   PONV (postoperative nausea and vomiting)     Pre-diabetes    RESOLVED: Nonischemic cardiomyopathy ([place]) 07/2015   Normal coronary arteries by cath. EF was 25-30% by echo. GR2 DD - likely related to LBBB in setting of A. fib => EF as of 03/2019: 60 to 65%, no RWMA.  GR 1 DD.   S/P radiation therapy 07/18/2013-08/28/2013   1) Left Breast / 50 Gy in 25 fractions/ 2) Left Breast Boost / 10 Gy in 5 fractions   Shingles      Allergies  Allergen Reactions   Requip [Ropinirole Hcl] Shortness Of Breath and Nausea And Vomiting   Minocycline Hives   Tetracyclines & Related Hives   Zestril [Lisinopril] Cough   Zetia [Ezetimibe] Other (See Comments)    MYALGIA JOINT PAIN   Codeine Other (See Comments)    fatigue   Morphine Sulfate Palpitations   Spironolactone Rash   Sulfa Antibiotics Other (See Comments)    Headache (pt states that a blood pressure medicine caused a severe headache but doesn't remember a reaction to sulfa)    Current Outpatient Medications on File Prior to Visit  Medication Sig   acetaminophen (TYLENOL) 500 MG tablet Take 1,000 mg by mouth every 6 (six) hours as needed for moderate pain.   buPROPion (WELLBUTRIN XL) 150 MG 24 hr tablet TAKE 1 TABLET BY MOUTH EVERY DAY IN THE MORNING (Patient taking differently: Take 150 mg by mouth daily.)   Cholecalciferol (VITAMIN D3) 250 MCG (10000 UT) TABS Take 5,000 Units by mouth daily.   COLACE 100 MG capsule Take 100 mg by mouth 2 (two) times daily.   [facility] 12 HOUR NASAL DECONGESTANT 120 MG 12 hr tablet TAKE 1 TABLET BY MOUTH TWICE DAILY (EVERY 12 HOURS) FOR HEAD/CHEST CONGESTION & EAR FLUID   DULoxetine (CYMBALTA) 30 MG capsule TAKE 1 CAPSULE BY MOUTH 3 TIMES A DAY   ELIQUIS 5 MG TABS tablet TAKE 1 TABLET BY MOUTH TWICE A DAY   furosemide (LASIX) 20 MG tablet TAKE 1 TABLET BY MOUTH TWICE A DAY   gabapentin (NEURONTIN) 300 MG capsule TAKE 1 CAPSULE BY MOUTH TWICE A DAY FOR CHRONIC PAIN   hydrOXYzine (ATARAX) 50 MG tablet TAKE 1 TO 2 TABLETS 1 TO 2 HOURS BEFORE BEDTIME AS NEEDED FOR  SLEEP   ipratropium (ATROVENT) 0.03 % nasal spray PLACE 2 SPRAYS INTO THE NOSE EVERY MONDAY, TUESDAY, WEDNESDAY, THURSDAY, AND FRIDAY.   ketoconazole (NIZORAL) 2 % shampoo USE AS DIRECTED TWICE A WEEK   levothyroxine (SYNTHROID) 50 MCG tablet TAKE 1/2-1 TABLET DAILY ON EMPTY STOMACH W/ WATER FOR 30 MINS NO ANTACID/MEDS/CALCIUM/MAG. X 4 HOURS   losartan (COZAAR) 50 MG tablet Take 1 tablet (50 mg total) by mouth daily.   meclizine (ANTIVERT) 25 MG tablet 1/2-1 pill up to 3 times daily for motion sickness/dizziness. Caution, may cause drowsiness. (Patient taking differently: Take 25 mg by mouth 3 (three) times daily as needed for dizziness.)   metoprolol succinate (TOPROL XL) 25 MG 24 hr tablet Take 1 tablet (25 mg total) by mouth daily.   mometasone (NASONEX) 50 MCG/ACT nasal spray Use 1-2 sprays  to each nostril 1 to 2 x /day (Patient taking differently: Place 1-2 sprays into the nose 2 (two) times daily as needed (allergies).)   omeprazole (PRILOSEC) 40 MG capsule Take  1 capsule  2 x /day to Prevent Heartburn & Indigestion                                    /                     TAKE                           BY                        MOUTH   ondansetron (ZOFRAN-ODT) 4 MG disintegrating tablet Take 1 tablet (4 mg total) by mouth every 8 (eight) hours as needed for up to 15 doses for nausea or vomiting.   potassium chloride SA (KLOR-CON M20) 20 MEQ tablet TAKE ONE TABLET DAILY OR AS DIRECTED BY YOUR PROVIDER. (Patient taking differently: Take 20 mEq by mouth daily.)   prochlorperazine (COMPAZINE) 5 MG tablet TAKE 1/2 TO 1 TABLET 3 TIMES A DAY TO PREVENT VERTIGO & NAUSEA   rosuvastatin (CRESTOR) 40 MG tablet TAKE 1 TABLET BY MOUTH EVERY DAY EXCEPT TAKE 2 TABLETS BY MOUTH ON MONDAYS   Semaglutide, 1 MG/DOSE, 4 MG/3ML SOPN Inject 1 mg into the skin once a week.   traMADol (ULTRAM) 50 MG tablet Take 1 tablet every 4 hours as needed for severe pain   No current facility-administered medications on file  prior to visit.    ROS: all negative except above.   Physical Exam:  BP 130/70   Pulse 78   Temp (!) 97.1 F (36.2 C)   Ht '5\' 5"'$  (1.651 m)   Wt 202 lb (91.6 kg)   SpO2 95%   BMI 33.61 kg/m   General Appearance: Well nourished, in no apparent distress. Eyes: PERRLA, EOMs, conjunctiva no swelling or erythema Sinuses: No Frontal/maxillary tenderness ENT/Mouth: Ext aud canals clear, TMs without erythema, bulging. No erythema, swelling, or exudate on post pharynx.  Tonsils not swollen or erythematous. Hearing normal.  Neck: Supple, thyroid normal.  Respiratory: Respiratory effort normal, BS equal bilaterally without rales, rhonchi, wheezing or stridor.  Cardio: RRR with no MRGs. Brisk peripheral pulses without edema.  Abdomen: Soft, + BS.  Non tender, no guarding, rebound, hernias, masses. Lymphatics: Non tender without lymphadenopathy.  Musculoskeletal: Full ROM, 5/5 strength, normal gait.  Skin: Bilateral groin with erythematous rash. Warm, dry without rashes, lesions, ecchymosis.  Neuro: Cranial nerves intact. Normal muscle tone, no cerebellar symptoms. Sensation intact.  Psych: Awake and oriented X 3, normal affect, Insight and Judgment appropriate.     Darrol Jump, NP 2:18 PM [facility]

## 2022-03-16 ENCOUNTER — Encounter: Payer: Self-pay | Admitting: Nurse Practitioner

## 2022-03-16 ENCOUNTER — Other Ambulatory Visit: Payer: Self-pay | Admitting: Nurse Practitioner

## 2022-03-16 ENCOUNTER — Ambulatory Visit: Payer: Medicare Other

## 2022-03-16 ENCOUNTER — Ambulatory Visit (INDEPENDENT_AMBULATORY_CARE_PROVIDER_SITE_OTHER): Payer: Medicare Other | Admitting: Nurse Practitioner

## 2022-03-16 VITALS — BP 130/70 | HR 78 | Temp 97.1°F | Ht 65.0 in | Wt 202.0 lb

## 2022-03-16 DIAGNOSIS — L299 Pruritus, unspecified: Secondary | ICD-10-CM | POA: Diagnosis not present

## 2022-03-16 DIAGNOSIS — N1831 Chronic kidney disease, stage 3a: Secondary | ICD-10-CM

## 2022-03-16 DIAGNOSIS — R7309 Other abnormal glucose: Secondary | ICD-10-CM

## 2022-03-16 DIAGNOSIS — L539 Erythematous condition, unspecified: Secondary | ICD-10-CM

## 2022-03-16 DIAGNOSIS — B372 Candidiasis of skin and nail: Secondary | ICD-10-CM

## 2022-03-16 MED ORDER — SEMAGLUTIDE (2 MG/DOSE) 8 MG/3ML ~~LOC~~ SOPN: 2.0000 mL | PEN_INJECTOR | SUBCUTANEOUS | 2 refills | Status: DC

## 2022-03-16 MED ORDER — NYSTATIN 100000 UNIT/GM EX CREA: 1.0000 | TOPICAL_CREAM | Freq: Two times a day (BID) | CUTANEOUS | 2 refills | Status: DC

## 2022-03-17 LAB — COMPLETE METABOLIC PANEL WITH GFR
AG Ratio: 1.7 (calc) (ref 1.0–2.5)
ALT: 7 U/L (ref 6–29)
AST: 13 U/L (ref 10–35)
Albumin: 4 g/dL (ref 3.6–5.1)
Alkaline phosphatase (APISO): 73 U/L (ref 37–153)
BUN: 21 mg/dL (ref 7–25)
CO2: 25 mmol/L (ref 20–32)
Calcium: 8.8 mg/dL (ref 8.6–10.4)
Chloride: 105 mmol/L (ref 98–110)
Creat: 0.97 mg/dL (ref 0.60–1.00)
Globulin: 2.3 g/dL (calc) (ref 1.9–3.7)
Glucose, Bld: 100 mg/dL — ABNORMAL HIGH (ref 65–99)
Potassium: 4.1 mmol/L (ref 3.5–5.3)
Sodium: 139 mmol/L (ref 135–146)
Total Bilirubin: 0.3 mg/dL (ref 0.2–1.2)
Total Protein: 6.3 g/dL (ref 6.1–8.1)
eGFR: 61 mL/min/{1.73_m2} (ref >=60)

## 2022-03-28 ENCOUNTER — Other Ambulatory Visit: Payer: Self-pay

## 2022-03-28 MED ORDER — SEMAGLUTIDE (2 MG/DOSE) 8 MG/3ML ~~LOC~~ SOPN: 2.0000 mg | PEN_INJECTOR | SUBCUTANEOUS | 2 refills | Status: DC

## 2022-03-29 ENCOUNTER — Other Ambulatory Visit: Payer: Self-pay | Admitting: Internal Medicine

## 2022-03-29 DIAGNOSIS — F5101 Primary insomnia: Secondary | ICD-10-CM

## 2022-04-13 ENCOUNTER — Ambulatory Visit (INDEPENDENT_AMBULATORY_CARE_PROVIDER_SITE_OTHER): Payer: Medicare Other | Admitting: Nurse Practitioner

## 2022-04-13 ENCOUNTER — Other Ambulatory Visit: Payer: Self-pay

## 2022-04-13 ENCOUNTER — Ambulatory Visit
Admission: RE | Admit: 2022-04-13 | Discharge: 2022-04-13 | Disposition: A | Discharge status: Home / Self Care | Payer: Medicare Other | Source: Ambulatory Visit | Attending: Nurse Practitioner | Admitting: Nurse Practitioner

## 2022-04-13 VITALS — BP 108/70 | HR 92 | Temp 97.3°F | Ht 65.0 in | Wt 192.2 lb

## 2022-04-13 DIAGNOSIS — M549 Dorsalgia, unspecified: Secondary | ICD-10-CM

## 2022-04-13 DIAGNOSIS — R058 Other specified cough: Secondary | ICD-10-CM

## 2022-04-13 DIAGNOSIS — R062 Wheezing: Secondary | ICD-10-CM

## 2022-04-13 DIAGNOSIS — R0789 Other chest pain: Secondary | ICD-10-CM

## 2022-04-13 DIAGNOSIS — Z1152 Encounter for screening for COVID-19: Secondary | ICD-10-CM

## 2022-04-13 DIAGNOSIS — B9689 Other specified bacterial agents as the cause of diseases classified elsewhere: Secondary | ICD-10-CM

## 2022-04-13 DIAGNOSIS — R6889 Other general symptoms and signs: Secondary | ICD-10-CM | POA: Diagnosis not present

## 2022-04-13 DIAGNOSIS — J988 Other specified respiratory disorders: Secondary | ICD-10-CM | POA: Diagnosis not present

## 2022-04-13 DIAGNOSIS — R051 Acute cough: Secondary | ICD-10-CM

## 2022-04-13 LAB — POC COVID19 BINAXNOW: SARS Coronavirus 2 Ag: NEGATIVE

## 2022-04-13 LAB — POCT INFLUENZA A/B
Influenza A, POC: NEGATIVE
Influenza B, POC: NEGATIVE

## 2022-04-13 MED ORDER — AZITHROMYCIN 500 MG PO TABS: 500.0000 mg | ORAL_TABLET | Freq: Every day | ORAL | 0 refills | Status: AC

## 2022-04-13 MED ORDER — IPRATROPIUM-ALBUTEROL 0.5-2.5 (3) MG/3ML IN SOLN: 3.0000 mL | Freq: Once | RESPIRATORY_TRACT | Status: AC

## 2022-04-13 MED ORDER — PROMETHAZINE-DM 6.25-15 MG/5ML PO SYRP: 5.0000 mL | ORAL_SOLUTION | Freq: Four times a day (QID) | ORAL | 0 refills | Status: DC | PRN

## 2022-04-13 NOTE — Progress Notes (Signed)
Assessment and Plan:  [name] [name] was seen today for an episodic visit.  Diagnoses and all order for this visit:  Encounter for screening for COVID-19 Negative  - POC COVID-19  Flu-like symptoms Negative  - POCT Influenza A/B  Bacterial respiratory infection Concerned for PNA Will obtain CXR Start abx as directed Stay well hydrated to keep mucus thin and productive  - DG Chest 2 View; Future - azithromycin (ZITHROMAX) 500 MG tablet; Take 1 tablet (500 mg total) by mouth daily for 10 days.  Dispense: 10 tablet; Refill: 0  Productive cough Continue to stay well hydrated to keep mucus thin and productive Zyrtec 10 mg samples provided  - promethazine-dextromethorphan (PROMETHAZINE-DM) 6.25-15 MG/5ML syrup; Take 5 mLs by mouth 4 (four) times daily as needed for cough.  Dispense: 240 mL; Refill: 0 - ipratropium-albuterol (DUONEB) 0.5-2.5 (3) MG/3ML nebulizer solution 3 mL - DG Chest 2 View; Future  Wheezing  - DG Chest 2 View; Future - ipratropium-albuterol (DUONEB) 0.5-2.5 (3) MG/3ML nebulizer solution 3 mL  Chest tightness Report to ER or call 911 for any increase in difficulty breathing.  - DG Chest 2 View; Future - ipratropium-albuterol (DUONEB) 0.5-2.5 (3) MG/3ML nebulizer solution 3 mL  Pain, upper back Continue Tylenol as directed as needed  - DG Chest 2 View; Future   Notify office for further evaluation and treatment, questions or concerns if s/s fail to improve. The risks and benefits of my recommendations, as well as other treatment options were discussed with the patient today. Questions were answered.  Further disposition pending results of labs. Discussed med's effects and SE's.    Over 20 minutes of exam, counseling, chart review, and critical decision making was performed.   Future Appointments  Date Time Provider [place]  06/02/2022  2:30 PM Alycia Rossetti, NP [facility] None  03/02/2023  2:00 PM Alycia Rossetti, NP [facility]  None    ------------------------------------------------------------------------------------------------------------------   HPI BP 108/70   Pulse 92   Temp (!) 97.3 F (36.3 C)   Ht '5\' 5"'$  (1.651 m)   Wt 192 lb 3.2 oz (87.2 kg)   SpO2 95%   BMI 31.98 kg/m    Patient complains of symptoms of a URI, possible sinusitis. Symptoms include achiness, congestion, cough described as productive, facial pain, fever-duration 3  days, nasal congestion, nausea with vomiting, shortness of breath, sinus pressure, sneezing, sore throat, wheezing, and upper right posterior back pain . Onset of symptoms was 10 days ago, and has been unchanged since that time. Treatment to date: decongestants and Tylenol.  Was unable to take Mucinex decongestant .   Past Medical History:  Diagnosis Date   Anemia    Anxiety    Arthritis    Chronic back pain    cyst sitting on L4-5;slipped disc   Chronic heart failure with preserved ejection fraction (HFpEF) ([place]) 03/2019   EF improved from 25 and 30% back to 50 to 65% with GR 1 DD.   Depression    takes Cymbalta daily   Diverticulosis    GERD (gastroesophageal reflux disease)    takes Omeprazole daily   Headache(784.0)    rare   History of pancreatitis 03/27/2008   History of ulcer disease 08/06/2015   Hyperlipidemia    Hypertension    takes Cardura nightly and Verapamil daily   Hypothyroidism    Insomnia    takes Restoril nightly   Malignant neoplasm of lower-inner quadrant of left breast in female, estrogen receptor positive ([place]) 05/20/2013  L Breast Cancer   Obesity (BMI 30-39.9) 06/11/2013   Paroxysmal atrial fibrillation ([place]): CHA2DS2-VASc Score - starting Eliquis 08/06/2015   This patients CHA2DS2-VASc Score and unadjusted Ischemic Stroke Rate (% per year) is equal to 4.8 % stroke rate/year from a score of 4  Above score calculated as 1 point each if present [CHF, HTN, DM, Vascular=MI/PAD/Aortic Plaque, Age if 65-74, or Female]; 2 points each if  present [Age > 75, or Stroke/TIA/TE]   Personal history of radiation therapy    2015   Pneumonia due to COVID-19 virus 04/05/2019   PONV (postoperative nausea and vomiting)    Pre-diabetes    RESOLVED: Nonischemic cardiomyopathy ([place]) 07/2015   Normal coronary arteries by cath. EF was 25-30% by echo. GR2 DD - likely related to LBBB in setting of A. fib => EF as of 03/2019: 60 to 65%, no RWMA.  GR 1 DD.   S/P radiation therapy 07/18/2013-08/28/2013   1) Left Breast / 50 Gy in 25 fractions/ 2) Left Breast Boost / 10 Gy in 5 fractions   Shingles      Allergies  Allergen Reactions   Requip [Ropinirole Hcl] Shortness Of Breath and Nausea And Vomiting   Minocycline Hives   Tetracyclines & Related Hives   Zestril [Lisinopril] Cough   Zetia [Ezetimibe] Other (See Comments)    MYALGIA JOINT PAIN   Codeine Other (See Comments)    fatigue   Morphine Sulfate Palpitations   Spironolactone Rash   Sulfa Antibiotics Other (See Comments)    Headache (pt states that a blood pressure medicine caused a severe headache but doesn't remember a reaction to sulfa)    Current Outpatient Medications on File Prior to Visit  Medication Sig   acetaminophen (TYLENOL) 500 MG tablet Take 1,000 mg by mouth every 6 (six) hours as needed for moderate pain.   buPROPion (WELLBUTRIN XL) 150 MG 24 hr tablet TAKE 1 TABLET BY MOUTH EVERY DAY IN THE MORNING (Patient taking differently: Take 150 mg by mouth daily.)   Cholecalciferol (VITAMIN D3) 250 MCG (10000 UT) TABS Take 5,000 Units by mouth daily.   COLACE 100 MG capsule Take 100 mg by mouth 2 (two) times daily.   [facility]S 12 HOUR NASAL DECONGESTANT 120 MG 12 hr tablet TAKE 1 TABLET BY MOUTH TWICE DAILY (EVERY 12 HOURS) FOR HEAD/CHEST CONGESTION & EAR FLUID   DULoxetine (CYMBALTA) 30 MG capsule TAKE 1 CAPSULE BY MOUTH 3 TIMES A DAY   ELIQUIS 5 MG TABS tablet TAKE 1 TABLET BY MOUTH TWICE A DAY   furosemide (LASIX) 20 MG tablet TAKE 1 TABLET BY MOUTH TWICE A DAY   gabapentin  (NEURONTIN) 300 MG capsule TAKE 1 CAPSULE BY MOUTH TWICE A DAY FOR CHRONIC PAIN   hydrOXYzine (ATARAX) 50 MG tablet TAKE 1 TO 2 TABLETS 1 TO 2 HOURS BEFORE BEDTIME AS NEEDED FOR SLEEP   ipratropium (ATROVENT) 0.03 % nasal spray PLACE 2 SPRAYS INTO THE NOSE EVERY MONDAY, TUESDAY, WEDNESDAY, THURSDAY, AND FRIDAY.   ketoconazole (NIZORAL) 2 % shampoo USE AS DIRECTED TWICE A WEEK   levothyroxine (SYNTHROID) 50 MCG tablet TAKE 1/2-1 TABLET DAILY ON EMPTY STOMACH W/ WATER FOR 30 MINS NO ANTACID/MEDS/CALCIUM/MAG. X 4 HOURS   losartan (COZAAR) 50 MG tablet Take 1 tablet (50 mg total) by mouth daily.   meclizine (ANTIVERT) 25 MG tablet 1/2-1 pill up to 3 times daily for motion sickness/dizziness. Caution, may cause drowsiness. (Patient taking differently: Take 25 mg by mouth 3 (three) times daily as needed  for dizziness.)   metoprolol succinate (TOPROL XL) 25 MG 24 hr tablet Take 1 tablet (25 mg total) by mouth daily.   mometasone (NASONEX) 50 MCG/ACT nasal spray Use 1-2 sprays  to each nostril 1 to 2 x /day (Patient taking differently: Place 1-2 sprays into the nose 2 (two) times daily as needed (allergies).)   nystatin cream (MYCOSTATIN) Apply 1 Application topically 2 (two) times daily.   omeprazole (PRILOSEC) 40 MG capsule Take  1 capsule  2 x /day to Prevent Heartburn & Indigestion                                    /                     TAKE                           BY                        MOUTH   ondansetron (ZOFRAN-ODT) 4 MG disintegrating tablet Take 1 tablet (4 mg total) by mouth every 8 (eight) hours as needed for up to 15 doses for nausea or vomiting.   potassium chloride SA (KLOR-CON M20) 20 MEQ tablet TAKE ONE TABLET DAILY OR AS DIRECTED BY YOUR PROVIDER. (Patient taking differently: Take 20 mEq by mouth daily.)   prochlorperazine (COMPAZINE) 5 MG tablet TAKE 1/2 TO 1 TABLET 3 TIMES A DAY TO PREVENT VERTIGO & NAUSEA   rosuvastatin (CRESTOR) 40 MG tablet TAKE 1 TABLET BY MOUTH EVERY DAY EXCEPT  TAKE 2 TABLETS BY MOUTH ON MONDAYS   Semaglutide, 2 MG/DOSE, 8 MG/3ML SOPN Inject 2 mg into the skin once a week.   traMADol (ULTRAM) 50 MG tablet Take 1 tablet every 4 hours as needed for severe pain   No current facility-administered medications on file prior to visit.    ROS: all negative except what is noted in the HPI.   Physical Exam:  BP 108/70   Pulse 92   Temp (!) 97.3 F (36.3 C)   Ht '5\' 5"'$  (1.651 m)   Wt 192 lb 3.2 oz (87.2 kg)   SpO2 95%   BMI 31.98 kg/m   General Appearance: NAD.  Awake, conversant and cooperative. Eyes: PERRLA, EOMs intact.  Sclera white.  Conjunctiva without erythema. Sinuses: Frontal/maxillary tenderness.  No nasal discharge. Nares not patent.  ENT/Mouth: Ext aud canals clear.  Bilateral TMs w/DOL and without erythema or bulging. Hearing intact.  Posterior pharynx without swelling or exudate.  Tonsils without swelling or erythema.  Neck: Supple.  No masses, nodules or thyromegaly. Respiratory: Effort is regular with non-labored breathing. Breath sounds are equal bilaterally with scattered rhonchi and wheezing throughput all lung fields.  Tenderness to palpation along posterior CVA and  upper back. Cardio: RRR with no MRGs. Brisk peripheral pulses without edema.  Abdomen: Active BS in all four quadrants.  Soft and non-tender without guarding, rebound tenderness, hernias or masses. Lymphatics: Non tender without lymphadenopathy.  Musculoskeletal: Full ROM, 5/5 strength, normal ambulation.  No clubbing or cyanosis. Skin: Appropriate color for ethnicity. Warm without rashes, lesions, ecchymosis, ulcers.  Neuro: CN II-XII grossly normal. Normal muscle tone without cerebellar symptoms and intact sensation.   Psych: AO X 3,  appropriate mood and affect, insight and judgment.     Bertis Hustead  Shonteria Abeln, NP 9:49 AM [facility]

## 2022-04-13 NOTE — Patient Instructions (Signed)

## 2022-04-25 ENCOUNTER — Telehealth: Payer: Self-pay

## 2022-04-25 NOTE — Telephone Encounter (Signed)
PA completed and submitted to Cover My Meds for Ozempic.

## 2022-05-04 ENCOUNTER — Telehealth: Payer: Self-pay | Admitting: Nurse Practitioner

## 2022-05-04 NOTE — Telephone Encounter (Signed)
Pt is wanting to follow up on PA for ozempic

## 2022-05-04 NOTE — Telephone Encounter (Signed)
PA for Ozempic was denied

## 2022-05-06 ENCOUNTER — Telehealth: Payer: Self-pay | Admitting: Nurse Practitioner

## 2022-05-06 ENCOUNTER — Other Ambulatory Visit: Payer: Self-pay | Admitting: Nurse Practitioner

## 2022-05-06 DIAGNOSIS — R7303 Prediabetes: Secondary | ICD-10-CM

## 2022-05-06 DIAGNOSIS — Z833 Family history of diabetes mellitus: Secondary | ICD-10-CM

## 2022-05-06 MED ORDER — TIRZEPATIDE 2.5 MG/0.5ML ~~LOC~~ SOAJ: 2.5000 mg | SUBCUTANEOUS | 2 refills | Status: DC

## 2022-05-06 NOTE — Telephone Encounter (Signed)
Patient states that she spoke with insurance and they told her they would cover the [facility] if it was listed in the P.A. that she has a family history of diabetes and that she is pre-diabetic.

## 2022-05-11 ENCOUNTER — Ambulatory Visit: Payer: Medicare Other | Admitting: Nurse Practitioner

## 2022-05-11 ENCOUNTER — Other Ambulatory Visit: Payer: Self-pay | Admitting: Internal Medicine

## 2022-05-11 ENCOUNTER — Other Ambulatory Visit: Payer: Self-pay | Admitting: Nurse Practitioner

## 2022-05-11 ENCOUNTER — Encounter: Payer: Self-pay | Admitting: Nurse Practitioner

## 2022-05-11 VITALS — BP 110/62 | HR 87 | Temp 97.7°F | Ht 65.0 in | Wt 192.0 lb

## 2022-05-11 DIAGNOSIS — E039 Hypothyroidism, unspecified: Secondary | ICD-10-CM

## 2022-05-11 DIAGNOSIS — N1831 Chronic kidney disease, stage 3a: Secondary | ICD-10-CM

## 2022-05-11 DIAGNOSIS — I10 Essential (primary) hypertension: Secondary | ICD-10-CM | POA: Diagnosis not present

## 2022-05-11 DIAGNOSIS — R7303 Prediabetes: Secondary | ICD-10-CM | POA: Diagnosis not present

## 2022-05-11 DIAGNOSIS — E669 Obesity, unspecified: Secondary | ICD-10-CM

## 2022-05-11 DIAGNOSIS — I480 Paroxysmal atrial fibrillation: Secondary | ICD-10-CM

## 2022-05-11 DIAGNOSIS — R7309 Other abnormal glucose: Secondary | ICD-10-CM

## 2022-05-11 DIAGNOSIS — E782 Mixed hyperlipidemia: Secondary | ICD-10-CM

## 2022-05-11 DIAGNOSIS — Z79899 Other long term (current) drug therapy: Secondary | ICD-10-CM

## 2022-05-11 MED ORDER — WEGOVY 0.25 MG/0.5ML ~~LOC~~ SOAJ: 0.2500 mg | SUBCUTANEOUS | 2 refills | Status: DC

## 2022-05-11 NOTE — Patient Instructions (Signed)
Semaglutide Injection (Weight Management) What is this medication? SEMAGLUTIDE (SEM a GLOO tide) promotes weight loss. It may also be used to maintain weight loss. It works by decreasing appetite. Changes to diet and exercise are often combined with this medication. This medicine may be used for other purposes; ask your health care provider or pharmacist if you have questions. COMMON BRAND NAME(S): [id number] What should I tell my care team before I take this medication? They need to know if you have any of these conditions: Endocrine tumors (MEN 2) or if someone in your family had these tumors Eye disease, vision problems Gallbladder disease History of depression or mental health disease History of pancreatitis Kidney disease Stomach or intestine problems Suicidal thoughts, plans, or attempt; a previous suicide attempt by you or a family member Thyroid cancer or if someone in your family had thyroid cancer An unusual or allergic reaction to semaglutide, other medications, foods, dyes, or preservatives Pregnant or trying to get pregnant Breast-feeding How should I use this medication? This medication is injected under the skin. You will be taught how to prepare and give it. Take it as directed on the prescription label. It is given once every week (every 7 days). Keep taking it unless your care team tells you to stop. It is important that you put your used needles and pens in a special sharps container. Do not put them in a trash can. If you do not have a sharps container, call your pharmacist or care team to get one. A special MedGuide will be given to you by the pharmacist with each prescription and refill. Be sure to read this information carefully each time. This medication comes with INSTRUCTIONS FOR USE. Ask your pharmacist for directions on how to use this medication. Read the information carefully. Talk to your pharmacist or care team if you have questions. Talk to your care team about  the use of this medication in children. While it may be prescribed for children as young as 12 years for selected conditions, precautions do apply. Overdosage: If you think you have taken too much of this medicine contact a poison control center or emergency room at once. NOTE: This medicine is only for you. Do not share this medicine with others. What if I miss a dose? If you miss a dose and the next scheduled dose is more than 2 days away, take the missed dose as soon as possible. If you miss a dose and the next scheduled dose is less than 2 days away, do not take the missed dose. Take the next dose at your regular time. Do not take double or extra doses. If you miss your dose for 2 weeks or more, take the next dose at your regular time or call your care team to talk about how to restart this medication. What may interact with this medication? Insulin and other medications for diabetes This list may not describe all possible interactions. Give your health care provider a list of all the medicines, herbs, non-prescription drugs, or dietary supplements you use. Also tell them if you smoke, drink alcohol, or use illegal drugs. Some items may interact with your medicine. What should I watch for while using this medication? Visit your care team for regular checks on your progress. It may be some time before you see the benefit from this medication. Drink plenty of fluids while taking this medication. Check with your care team if you have severe diarrhea, nausea, and vomiting, or if you sweat a  lot. The loss of too much body fluid may make it dangerous for you to take this medication. This medication may affect blood sugar levels. Ask your care team if changes in diet or medications are needed if you have diabetes. If you or your family notice any changes in your behavior, such as new or worsening depression, thoughts of harming yourself, anxiety, other unusual or disturbing thoughts, or memory loss, call  your care team right away. Women should inform their care team if they wish to become pregnant or think they might be pregnant. Losing weight while pregnant is not advised and may cause harm to the unborn child. Talk to your care team for more information. What side effects may I notice from receiving this medication? Side effects that you should report to your care team as soon as possible: Allergic reactions--skin rash, itching, hives, swelling of the face, lips, tongue, or throat Change in vision Dehydration--increased thirst, dry mouth, feeling faint or lightheaded, headache, dark yellow or brown urine Gallbladder problems--severe stomach pain, nausea, vomiting, fever Heart palpitations--rapid, pounding, or irregular heartbeat Kidney injury--decrease in the amount of urine, swelling of the ankles, hands, or feet Pancreatitis--severe stomach pain that spreads to your back or gets worse after eating or when touched, fever, nausea, vomiting Thoughts of suicide or self-harm, worsening mood, feelings of depression Thyroid cancer--new mass or lump in the neck, pain or trouble swallowing, trouble breathing, hoarseness Side effects that usually do not require medical attention (report to your care team if they continue or are bothersome): Diarrhea Loss of appetite Nausea Stomach pain Vomiting This list may not describe all possible side effects. Call your doctor for medical advice about side effects. You may report side effects to FDA at 1-800-FDA-1088. Where should I keep my medication? Keep out of the reach of children and pets. Refrigeration (preferred): Store in the refrigerator. Do not freeze. Keep this medication in the original container until you are ready to take it. Get rid of any unused medication after the expiration date. Room temperature: If needed, prior to cap removal, the pen can be stored at room temperature for up to 28 days. Protect from light. If it is stored at room  temperature, get rid of any unused medication after 28 days or after it expires, whichever is first. It is important to get rid of the medication as soon as you no longer need it or it is expired. You can do this in two ways: Take the medication to a medication take-back program. Check with your pharmacy or law enforcement to find a location. If you cannot return the medication, follow the directions in the [place]. NOTE: This sheet is a summary. It may not cover all possible information. If you have questions about this medicine, talk to your doctor, pharmacist, or health care provider.  2023 Elsevier/Gold Standard (2020-06-11 00:00:00)

## 2022-05-11 NOTE — Progress Notes (Signed)
Assessment and Plan:  [name] [name] was seen today for an episodic visit.  Diagnoses and all order for this visit:  Prediabetes/abnormal glucose Start Wegovy as directed. Education: Reviewed 'ABCs' of diabetes management  Discussed goals to be met and/or maintained include A1C (<7) Blood pressure (<130/80) Cholesterol (LDL <70) Continue Eye Exam yearly  Continue Dental Exam Q6 mo Discussed dietary recommendations Discussed Physical Activity recommendations  - Semaglutide-Weight Management (WEGOVY) 0.25 MG/0.5ML SOAJ; Inject 0.25 mg into the skin once a week.  Dispense: 2 mL; Refill: 2  Essential hypertension Discussed DASH (Dietary Approaches to Stop Hypertension) DASH diet is lower in sodium than a typical American diet. Cut back on foods that are high in saturated fat, cholesterol, and trans fats. Eat more whole-grain foods, fish, poultry, and nuts Remain active and exercise as tolerated daily.  Monitor BP at home-Call if greater than 130/80.  Check CMP/CBC   Paroxysmal atrial fibrillation ([place]): CHA2DS2-VASc Score  4 ->  Eliquis Not a candidate for Phentermine Continue anticoagulant Continue to monitor  Hypothyroidism, unspecified type TSH levels well maintained Controlled. Continue Levothyroxine. Reminded to take on an empty stomach 30-94mns before food.  Stop any Biotin Supplement 48-72 hours before next TSH level to reduce the risk of falsely low TSH levels. Continue to monitor.    Stage 3a chronic kidney disease ([place] Discussed how what you eat and drink can aide in kidney protection. Stay well hydrated. Avoid high salt foods. Avoid NSAIDS. Keep BP and BG well controlled.   Take medications as prescribed. Remain active and exercise as tolerated daily. Maintain weight.  Continue to monitor. Check CMP/GFR/Microablumin  Hyperlipidemia, mixed Discussed lifestyle modifications. Recommended diet heavy in fruits and veggies, omega 3's. Decrease  consumption of animal meats, cheeses, and dairy products. Remain active and exercise as tolerated. Continue to monitor. Check lipids/TSH  Obesity (BMI 30-39.9) Discussed appropriate BMI Goal of losing 1 lb per month. Diet modification. Physical activity. Encouraged/praised to build confidence.  - Semaglutide-Weight Management (WEGOVY) 0.25 MG/0.5ML SOAJ; Inject 0.25 mg into the skin once a week.  Dispense: 2 mL; Refill: 2  Medication management All medications discussed and reviewed in full. All questions and concerns regarding medications addressed.     Notify office for further evaluation and treatment, questions or concerns if s/s fail to improve. The risks and benefits of my recommendations, as well as other treatment options were discussed with the patient today. Questions were answered.  Further disposition pending results of labs. Discussed med's effects and SE's.    Over 20 minutes of exam, counseling, chart review, and critical decision making was performed.   Future Appointments  Date Time Provider DLingle 06/02/2022  2:30 PM WAlycia Rossetti NP [facility] None  06/17/2022  1:30 PM CDeberah Pelton NP [facility] None  03/02/2023  2:00 PM WAlycia Rossetti NP [facility] None    ------------------------------------------------------------------------------------------------------------------   HPI BP 110/62   Pulse 87   Temp 97.7 F (36.5 C)   Ht '5\' 5"'$  (1.651 m)   Wt 192 lb (87.1 kg)   SpO2 98%   BMI 31.95 kg/m   77y.o.female presents for evaluation of weight management.  Was recently on Ozempic but no longer covered.  She has a hx of hyperlipidemia, HTN, abnormal glucose/prediabetes, atrial fibrillation, CKD3, hypothyroidism.  She had successful weight loss with Ozempic and was without side effects.  BMI is Body mass index is 31.95 kg/m., she has been working on diet and exercise. Wt Readings from Last 3  Encounters:  05/11/22 192 lb (87.1 kg)   04/13/22 192 lb 3.2 oz (87.2 kg)  03/16/22 202 lb (91.6 kg)    Her blood pressure has been controlled at home, today their BP is BP: 110/62  She does not workout. She denies chest pain, shortness of breath, dizziness. BP Readings from Last 3 Encounters:  05/11/22 110/62  04/13/22 108/70  03/16/22 130/70   She is on cholesterol medication Rosuvastatin and denies myalgias. Her cholesterol is at goal. The cholesterol last visit was:   Lab Results  Component Value Date   CHOL 133 02/28/2022   HDL 50 02/28/2022   LDLCALC 62 02/28/2022   TRIG 126 02/28/2022   CHOLHDL 2.7 02/28/2022    She has been working on diet and exercise for prediabetes, and denies polydipsia and polyuria. Last A1C in the office was:  Lab Results  Component Value Date   HGBA1C 5.9 (H) 02/28/2022    Past Medical History:  Diagnosis Date   Anemia    Anxiety    Arthritis    Chronic back pain    cyst sitting on L4-5;slipped disc   Chronic heart failure with preserved ejection fraction (HFpEF) ([place]) 03/2019   EF improved from 25 and 30% back to 50 to 65% with GR 1 DD.   Depression    takes Cymbalta daily   Diverticulosis    GERD (gastroesophageal reflux disease)    takes Omeprazole daily   Headache(784.0)    rare   History of pancreatitis 03/27/2008   History of ulcer disease 08/06/2015   Hyperlipidemia    Hypertension    takes Cardura nightly and Verapamil daily   Hypothyroidism    Insomnia    takes Restoril nightly   Malignant neoplasm of lower-inner quadrant of left breast in female, estrogen receptor positive ([place]) 05/20/2013   L Breast Cancer   Obesity (BMI 30-39.9) 06/11/2013   Paroxysmal atrial fibrillation ([place]): CHA2DS2-VASc Score - starting Eliquis 08/06/2015   This patients CHA2DS2-VASc Score and unadjusted Ischemic Stroke Rate (% per year) is equal to 4.8 % stroke rate/year from a score of 4  Above score calculated as 1 point each if present [CHF, HTN, DM, Vascular=MI/PAD/Aortic Plaque,  Age if 65-74, or Female]; 2 points each if present [Age > 75, or Stroke/TIA/TE]   Personal history of radiation therapy    2015   Pneumonia due to COVID-19 virus 04/05/2019   PONV (postoperative nausea and vomiting)    Pre-diabetes    RESOLVED: Nonischemic cardiomyopathy ([place]) 07/2015   Normal coronary arteries by cath. EF was 25-30% by echo. GR2 DD - likely related to LBBB in setting of A. fib => EF as of 03/2019: 60 to 65%, no RWMA.  GR 1 DD.   S/P radiation therapy 07/18/2013-08/28/2013   1) Left Breast / 50 Gy in 25 fractions/ 2) Left Breast Boost / 10 Gy in 5 fractions   Shingles      Allergies  Allergen Reactions   Requip [Ropinirole Hcl] Shortness Of Breath and Nausea And Vomiting   Minocycline Hives   Tetracyclines & Related Hives   Zestril [Lisinopril] Cough   Zetia [Ezetimibe] Other (See Comments)    MYALGIA JOINT PAIN   Codeine Other (See Comments)    fatigue   Morphine Sulfate Palpitations   Spironolactone Rash   Sulfa Antibiotics Other (See Comments)    Headache (pt states that a blood pressure medicine caused a severe headache but doesn't remember a reaction to sulfa)    Current Outpatient  Medications on File Prior to Visit  Medication Sig   tirzepatide [facility]) 2.5 MG/0.5ML Pen Inject 2.5 mg into the skin once a week.   acetaminophen (TYLENOL) 500 MG tablet Take 1,000 mg by mouth every 6 (six) hours as needed for moderate pain.   buPROPion (WELLBUTRIN XL) 150 MG 24 hr tablet TAKE 1 TABLET BY MOUTH EVERY DAY IN THE MORNING (Patient taking differently: Take 150 mg by mouth daily.)   Cholecalciferol (VITAMIN D3) 250 MCG (10000 UT) TABS Take 5,000 Units by mouth daily.   COLACE 100 MG capsule Take 100 mg by mouth 2 (two) times daily.   [employer] 12 HOUR NASAL DECONGESTANT 120 MG 12 hr tablet TAKE 1 TABLET BY MOUTH TWICE DAILY (EVERY 12 HOURS) FOR HEAD/CHEST CONGESTION & EAR FLUID   DULoxetine (CYMBALTA) 30 MG capsule TAKE 1 CAPSULE BY MOUTH 3 TIMES A DAY   ELIQUIS 5 MG TABS  tablet TAKE 1 TABLET BY MOUTH TWICE A DAY   furosemide (LASIX) 20 MG tablet TAKE 1 TABLET BY MOUTH TWICE A DAY   gabapentin (NEURONTIN) 300 MG capsule TAKE 1 CAPSULE BY MOUTH TWICE A DAY FOR CHRONIC PAIN   hydrOXYzine (ATARAX) 50 MG tablet TAKE 1 TO 2 TABLETS 1 TO 2 HOURS BEFORE BEDTIME AS NEEDED FOR SLEEP   ipratropium (ATROVENT) 0.03 % nasal spray PLACE 2 SPRAYS INTO THE NOSE EVERY MONDAY, TUESDAY, WEDNESDAY, THURSDAY, AND FRIDAY.   ketoconazole (NIZORAL) 2 % shampoo USE AS DIRECTED TWICE A WEEK   levothyroxine (SYNTHROID) 50 MCG tablet TAKE 1/2-1 TABLET DAILY ON EMPTY STOMACH W/ WATER FOR 30 MINS NO ANTACID/MEDS/CALCIUM/MAG. X 4 HOURS   losartan (COZAAR) 50 MG tablet Take 1 tablet (50 mg total) by mouth daily.   meclizine (ANTIVERT) 25 MG tablet 1/2-1 pill up to 3 times daily for motion sickness/dizziness. Caution, may cause drowsiness. (Patient taking differently: Take 25 mg by mouth 3 (three) times daily as needed for dizziness.)   metoprolol succinate (TOPROL XL) 25 MG 24 hr tablet Take 1 tablet (25 mg total) by mouth daily.   mometasone (NASONEX) 50 MCG/ACT nasal spray Use 1-2 sprays  to each nostril 1 to 2 x /day (Patient taking differently: Place 1-2 sprays into the nose 2 (two) times daily as needed (allergies).)   nystatin cream (MYCOSTATIN) Apply 1 Application topically 2 (two) times daily.   omeprazole (PRILOSEC) 40 MG capsule Take  1 capsule  2 x /day to Prevent Heartburn & Indigestion                                    /                     TAKE                           BY                        MOUTH   ondansetron (ZOFRAN-ODT) 4 MG disintegrating tablet Take 1 tablet (4 mg total) by mouth every 8 (eight) hours as needed for up to 15 doses for nausea or vomiting.   potassium chloride SA (KLOR-CON M20) 20 MEQ tablet TAKE ONE TABLET DAILY OR AS DIRECTED BY YOUR PROVIDER. (Patient taking differently: Take 20 mEq by mouth daily.)   prochlorperazine (COMPAZINE) 5 MG tablet TAKE 1/2 TO 1  TABLET 3 TIMES A DAY TO PREVENT VERTIGO & NAUSEA   promethazine-dextromethorphan (PROMETHAZINE-DM) 6.25-15 MG/5ML syrup Take 5 mLs by mouth 4 (four) times daily as needed for cough.   rosuvastatin (CRESTOR) 40 MG tablet TAKE 1 TABLET BY MOUTH EVERY DAY EXCEPT TAKE 2 TABLETS BY MOUTH ON MONDAYS   Semaglutide, 2 MG/DOSE, 8 MG/3ML SOPN Inject 2 mg into the skin once a week.   traMADol (ULTRAM) 50 MG tablet Take 1 tablet every 4 hours as needed for severe pain   Current Facility-Administered Medications on File Prior to Visit  Medication   ipratropium-albuterol (DUONEB) 0.5-2.5 (3) MG/3ML nebulizer solution 3 mL    ROS: all negative except what is noted in the HPI.   Physical Exam:  BP 110/62   Pulse 87   Temp 97.7 F (36.5 C)   Ht '5\' 5"'$  (1.651 m)   Wt 192 lb (87.1 kg)   SpO2 98%   BMI 31.95 kg/m   General Appearance: NAD.  Awake, conversant and cooperative. Eyes: PERRLA, EOMs intact.  Sclera white.  Conjunctiva without erythema. Sinuses: No frontal/maxillary tenderness.  No nasal discharge. Nares patent.  ENT/Mouth: Ext aud canals clear.  Bilateral TMs w/DOL and without erythema or bulging. Hearing intact.  Posterior pharynx without swelling or exudate.  Tonsils without swelling or erythema.  Neck: Supple.  No masses, nodules or thyromegaly. Respiratory: Effort is regular with non-labored breathing. Breath sounds are equal bilaterally without rales, rhonchi, wheezing or stridor.  Cardio: RRR with no MRGs. Brisk peripheral pulses without edema.  Abdomen: Active BS in all four quadrants.  Soft and non-tender without guarding, rebound tenderness, hernias or masses. Lymphatics: Non tender without lymphadenopathy.  Musculoskeletal: Full ROM, 5/5 strength, normal ambulation.  No clubbing or cyanosis. Skin: Appropriate color for ethnicity. Warm without rashes, lesions, ecchymosis, ulcers.  Neuro: CN II-XII grossly normal. Normal muscle tone without cerebellar symptoms and intact sensation.    Psych: AO X 3,  appropriate mood and affect, insight and judgment.     Darrol Jump, NP 3:17 PM [facility]

## 2022-05-13 ENCOUNTER — Other Ambulatory Visit: Payer: Self-pay | Admitting: Cardiology

## 2022-05-16 NOTE — Telephone Encounter (Signed)
Prescription refill request for Eliquis received. Indication:afib Last office visit:7/23 Scr:12/23  0.9 Age: 77 Weight:87.1  kg  Prescription refilled

## 2022-06-01 ENCOUNTER — Ambulatory Visit: Payer: Medicare Other | Admitting: Nurse Practitioner

## 2022-06-01 ENCOUNTER — Other Ambulatory Visit: Payer: Self-pay | Admitting: Cardiology

## 2022-06-01 NOTE — Progress Notes (Unsigned)
MEDICARE ANNUAL WELLNESS VISIT AND FOLLOW UP  Assessment:   Encounter for annual medicare wellness visit Yearly Will schedule mammogram  Essential hypertension Continue current medications:Metoprolol 39m daily, Lasix 267mdaily with potassium 2039m Losartan 50 mg QD Monitor blood pressure at home; call if consistently over 130/80 Continue DASH diet.   Reminder to go to the ER if any CP, SOB, nausea, dizziness, severe HA, changes vision/speech, left arm numbness and tingling and jaw pain. -     CBC with Differential/Platelet -     CMP/GFR -     TSH  Paroxysmal atrial fibrillation (HCTmc HealthcareCHA2DS2-VASc Score - starting Eliquis Control blood pressure, cholesterol, glucose, increase exercise.  Continue cardio follow up  Chronic combined systolic and diastolic heart failure, NYHA class 2 ([place]- exacerbated by A. Fib Weight stable, appears euvoluemic, followed by cardiology  Non-ischemic cardiomyopathy (HCC[place]eight stable at home Continue to monitor  Paroxysmal supraventricular tachycardia (HCC[place]agwayontrol blood pressure, cholesterol, glucose, increase exercise.  Continue cardio follow up  Non-seasonal allergic rhinitis, unspecified trigger - Allegra OTC, increase H20, allergy hygiene explained.   ANXIETY DEPRESSION Continue medications: bupropion XL 150 daily, cymbalta 39m61mD Discussed stress management techniques  Discussed, increase water,intake & good sleep hygiene  Discussed increasing exercise & vegetables in diet   Gastroesophageal reflux disease without esophagitis Continue PPI/H2 blocker, diet discussed  CKD 2  Increase fluids  Avoid NSAIDS Blood pressure control Monitor sugars  Will continue to monitor  Other specified hypothyroidism Taking levothyroxine 50 mcg daily Reminder to take on an empty stomach 30-60mi51mefore first meal of the day. No antacid medications for 4 hours. -     TSH  Lumbar radiculopathy Follow up ortho/pain management  PRN  DDD (degenerative disc disease), lumbar Follow up ortho PRN  Hyperlipidemia -continue medications, check lipids, decrease fatty foods, increase activity.  -     Lipid panel  H/O left bundle branch block Continue cardio follow up  History of pancreatitis Monitor, no ETOH  Hx of  -  Malignant neoplasm of lower-inner quadrant of left breast in female, estrogen receptor positive  S/p lumpectomy and 5 year anastrozole, has been released Monitoring by annual mammograms   Obesity with co morbidities (HCC) [place] discussion about weight loss, diet, and exercise Recommended diet heavy in fruits and veggies and low in animal meats, cheeses, and dairy products, appropriate calorie intake Patient will work on decreasing saturated fats and simple carbs, increase lean protein and activity as tolerated Follow up at next visit   Abnormal Glucose Discussed general issues about diabetes pathophysiology and management., Educational material distributed., Suggested low cholesterol diet., Encouraged aerobic exercise., Discussed foot care., Reminded to get yearly retinal exam.   Vitamin D deficiency At goal at recent check; continue to recommend supplementation for goal of 60-100 Defer vitamin D level  History of ulcer disease Monitor, avoid NSAIDS, continue GERD meds   Anemia Chronic, stable, monitor; recent iron was normal, stop supplement, get on B12 sublingual which was low. UTD on colonoscopy.    Over 40 minutes of face to face interview, exam, counseling, chart review and critical decision making was performed Future Appointments  Date Time Provider [place]/2024  1:30 PM CleavDeberah PeltonCVD-NORTHLIN None  03/02/2023  2:00 PM WilkiAlycia RossettiGAAM-GAAIM None     Plan:   During the course of the visit the patient was educated and counseled about appropriate screening and preventive services including:   Pneumococcal vaccine  Prevnar 13 Influenza  vaccine Td vaccine Screening electrocardiogram Bone densitometry screening Colorectal cancer screening Diabetes screening Glaucoma screening Nutrition counseling  Advanced directives: requested   Subjective:  [name] [name] is a 77 y.o. female who presents for Medicare Annual Wellness Visit and 3 month follow up.   Continues to work in [employer]. Overall doing well.  She has hx of R breast cancer in 2015, s/p lumpectomy, completed 5 years of anastrozole and was released by Dr. Jana Hakim, now following with annual mammograms.   She follows with Dr. Verl Blalock Dr. Lynann Bologna (pain management) for mid back pain with radiculopathy and gets epidural injections though reports hasn't been recently. She also has knee pain seen by Dr. [name].   she has a diagnosis of depression/anxiety and is currently on wellbutrin 150 mg, cymbalta 30 mg bid, reports symptoms are typically well controlled with this regimen.  she has a diagnosis of GERD with history of ulcer, which is currently managed by omeprazole 40 mg and famotidine PRN breakthrough.  she reports symptoms is currently well controlled, and denies breakthrough reflux, burning in chest, hoarseness or cough.    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 Previously used Ozempic with good weight loss- unable to get ozempic or Wegovy covered Wt Readings from Last 3 Encounters:  06/02/22 197 lb (89.4 kg)  05/11/22 192 lb (87.1 kg)  04/13/22 192 lb 3.2 oz (87.2 kg)   She has hx of a. Fib/PSVT, on eliquis, non-ischemic cardiomyopathy, diastolic CHF improved on ARB/BB, most recent ECHO with EF 60-65%, followed by cardiology Dr. Ellyn Hack. She is in regular rhythm currently- she sees cardiology on 06/17/22 Had benign holter results in Feb 2021.   Her blood pressure has been controlled at home, today their BP is BP: (!) 108/58  BP  Readings from Last 3 Encounters:  06/02/22 (!) 108/58  05/11/22 110/62  04/13/22 108/70    She does not workout. She denies chest pain, shortness of breath, dizziness.   She is on cholesterol medication (rosuvastatin 40 mg daily) and denies myalgias. Her cholesterol is at goal. The cholesterol last visit was:   Lab Results  Component Value Date   CHOL 133 02/28/2022   HDL 50 02/28/2022   LDLCALC 62 02/28/2022   TRIG 126 02/28/2022   CHOLHDL 2.7 02/28/2022    She has not been working on diet and exercise for prediabetes, and denies foot ulcerations, increased appetite, nausea, paresthesia of the feet, polydipsia, polyuria, visual disturbances, vomiting and weight loss. Last A1C in the office was:  Lab Results  Component Value Date   HGBA1C 5.9 (H) 02/28/2022   She has CKD II monitored at this office. Last GFR:  Lab Results  Component Value Date   EGFR 61 03/16/2022    Patient is on Vitamin D supplement.   Lab Results  Component Value Date   VD25OH 64 02/28/2022       Medication Review: Current Outpatient Medications on File Prior to Visit  Medication Sig Dispense Refill   acetaminophen (TYLENOL) 500 MG tablet Take 1,000 mg by mouth every 6 (six) hours as needed for moderate pain.     buPROPion (WELLBUTRIN XL) 150 MG 24 hr tablet TAKE 1 TABLET BY MOUTH EVERY DAY IN THE MORNING (Patient taking differently: Take 150 mg by mouth daily.) 90 tablet 1   Cholecalciferol (VITAMIN D3)  250 MCG (10000 UT) TABS Take 5,000 Units by mouth daily.     COLACE 100 MG capsule Take 100 mg by mouth 2 (two) times daily.     [facility] 12 HOUR NASAL DECONGESTANT 120 MG 12 hr tablet TAKE 1 TABLET BY MOUTH TWICE DAILY (EVERY 12 HOURS) FOR HEAD/CHEST CONGESTION & EAR FLUID 60 tablet 3   DULoxetine (CYMBALTA) 30 MG capsule TAKE 1 CAPSULE BY MOUTH 3 TIMES A DAY 270 capsule 3   ELIQUIS 5 MG TABS tablet TAKE 1 TABLET BY MOUTH TWICE A DAY 180 tablet 3   furosemide (LASIX) 20 MG tablet Take 1 tablet 2 x /day  for  Fluid Retention & Ankle Swelling 180 tablet 3   gabapentin (NEURONTIN) 300 MG capsule TAKE 1 CAPSULE BY MOUTH TWICE A DAY FOR CHRONIC PAIN 180 capsule 1   hydrOXYzine (ATARAX) 50 MG tablet TAKE 1 TO 2 TABLETS 1 TO 2 HOURS BEFORE BEDTIME AS NEEDED FOR SLEEP 180 tablet 2   ipratropium (ATROVENT) 0.03 % nasal spray PLACE 2 SPRAYS INTO THE NOSE EVERY MONDAY, TUESDAY, WEDNESDAY, THURSDAY, AND FRIDAY. 90 mL 3   ketoconazole (NIZORAL) 2 % shampoo USE AS DIRECTED TWICE A WEEK 120 mL 0   levothyroxine (SYNTHROID) 50 MCG tablet TAKE 1/2-1 TABLET DAILY ON EMPTY STOMACH W/ WATER FOR 30 MINS NO ANTACID/MEDS/CALCIUM/MAG. X 4 HOURS 90 tablet 3   losartan (COZAAR) 50 MG tablet Take 1 tablet (50 mg total) by mouth daily. 90 tablet 3   meclizine (ANTIVERT) 25 MG tablet 1/2-1 pill up to 3 times daily for motion sickness/dizziness. Caution, may cause drowsiness. (Patient taking differently: Take 25 mg by mouth 3 (three) times daily as needed for dizziness.) 60 tablet 0   metoprolol succinate (TOPROL XL) 25 MG 24 hr tablet Take 1 tablet (25 mg total) by mouth daily. 90 tablet 3   mometasone (NASONEX) 50 MCG/ACT nasal spray Use 1-2 sprays  to each nostril 1 to 2 x /day (Patient taking differently: Place 1-2 sprays into the nose 2 (two) times daily as needed (allergies).) 51 g 3   nystatin cream (MYCOSTATIN) Apply 1 Application topically 2 (two) times daily. 30 g 2   omeprazole (PRILOSEC) 40 MG capsule Take  1 capsule  2 x /day to Prevent Heartburn & Indigestion                                    /                     TAKE                           BY                        MOUTH 180 capsule 3   potassium chloride SA (KLOR-CON M20) 20 MEQ tablet TAKE ONE TABLET DAILY OR AS DIRECTED BY YOUR PROVIDER. (Patient taking differently: Take 20 mEq by mouth daily.) 90 tablet 1   rosuvastatin (CRESTOR) 40 MG tablet TAKE 1 TABLET BY MOUTH EVERY DAY EXCEPT TAKE 2 TABLETS BY MOUTH ON MONDAYS 102 tablet 2   traMADol (ULTRAM) 50 MG tablet  Take 1 tablet every 4 hours as needed for severe pain 30 tablet 0   Current Facility-Administered Medications on File Prior to Visit  Medication Dose Route Frequency Provider Last Rate  Last Admin   ipratropium-albuterol (DUONEB) 0.5-2.5 (3) MG/3ML nebulizer solution 3 mL  3 mL Nebulization Once Cranford, Tonya, NP        Allergies  Allergen Reactions   Requip [Ropinirole Hcl] Shortness Of Breath and Nausea And Vomiting   Minocycline Hives   Tetracyclines & Related Hives   Zestril [Lisinopril] Cough   Zetia [Ezetimibe] Other (See Comments)    MYALGIA JOINT PAIN   Codeine Other (See Comments)    fatigue   Morphine Sulfate Palpitations   Spironolactone Rash   Sulfa Antibiotics Other (See Comments)    Headache (pt states that a blood pressure medicine caused a severe headache but doesn't remember a reaction to sulfa)    Current Problems (verified) Patient Active Problem List   Diagnosis Date Noted   History of breast cancer 09/06/2019   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 04/30/2019   Bradycardia    History of COVID-19    CKD (chronic kidney disease) stage 3, GFR 30-59 ml/min (HCC) 09/21/2018   Severe obesity with body mass index (BMI) of 35.0 to 39.9 with comorbidity ([place]) 06/14/2017   Primary osteoarthritis of left knee 09/23/2016   Non-ischemic cardiomyopathy ([place])    (HFpEF) heart failure with preserved ejection fraction ([place]) 08/08/2015   Paroxysmal atrial fibrillation ([place]): CHA2DS2-VASc Score  4 ->  Eliquis 08/06/2015   Hypothyroidism 08/06/2015   Severe anemia    DDD (degenerative disc disease), lumbar 06/16/2014   Abnormal glucose 12/18/2013   Vitamin D deficiency 12/18/2013   Hyperlipidemia, mixed 03/13/2008   Major depressive disorder, recurrent episode, in partial remission ([place]) 03/13/2008   Essential hypertension 03/13/2008   H/O left bundle branch block 03/13/2008   Allergic rhinitis 03/13/2008   GERD 03/13/2008    Screening Tests  Mammogram  07/02/21  Immunization History  Administered Date(s) Administered   Fluad Quad(high Dose 65+) 01/15/2020   Influenza Whole 01/01/2013   Influenza, High Dose Seasonal PF 01/29/2014, 12/30/2014, 01/17/2019, 01/14/2021, 02/11/2022   Influenza,inj,Quad PF,6+ Mos 01/18/2018   Influenza-Unspecified 12/11/2015, 02/04/2017, 01/18/2018   PFIZER(Purple Top)SARS-COV-2 Vaccination 05/18/2019, 06/10/2019, 01/31/2020   PNEUMOCOCCAL CONJUGATE-20 02/25/2022   Pfizer Covid-19 Vaccine Bivalent Booster 17yr & up 04/28/2021   Pneumococcal Conjugate-13 06/16/2014   Pneumococcal Polysaccharide-23 05/23/2011   Td 09/24/2018   Tdap 03/11/2008   Zoster Recombinat (Shingrix) 02/25/2022   Zoster, Live 02/02/2010   Health Maintenance  Topic Date Due   COVID-19 Vaccine (5 - 2023-24 season) 06/18/2022 (Originally 12/10/2021)   Zoster Vaccines- Shingrix (2 of 2) 08/31/2022 (Originally 04/22/2022)   Medicare Annual Wellness (AWV)  06/03/2023   DTaP/Tdap/Td (3 - Td or Tdap) 09/23/2028   Pneumonia Vaccine 77 Years old  Completed   INFLUENZA VACCINE  Completed   DEXA SCAN  Completed   Hepatitis C Screening  Completed   HPV VACCINES  Aged Out   COLONOSCOPY (Pts 45-455yrInsurance coverage will need to be confirmed)  Discontinued    Names of Other Physician/Practitioners you currently use: 1. [facility] here for primary care 2. Dr ScNicki Reapereye doctor, last visit 2022 3. [facility], dentist, last visit 04-20-22  Patient Care Team: McUnk PintoMD as PCP - General (Internal Medicine) HaLeonie ManMD as PCP - Cardiology (Cardiology) Magrinat, GuVirgie DadMD (Inactive) as Consulting Physician (Oncology) HaLeonie ManMD as Consulting Physician (Cardiology) GrDorna LeitzMD as Consulting Physician (Orthopedic Surgery) WaRolm BookbinderMD as Consulting Physician (General Surgery) SqEppie GibsonMD as Attending Physician (Radiation  Oncology)  SURGICAL HISTORY She  has a past surgical history that includes Cataract extraction bilateral w/ anterior vitrectomy (Bilateral); Abdominal hysterectomy; Cholecystectomy; Knee arthroscopy (Bilateral); Tonsillectomy; Colonoscopy (2014); Upper gi endoscopy; Dupuytren / palmar fasciotomy (Bilateral, 2007); Tubal ligation; epidural injections; Lumbar fusion (10/2013); Cardiac catheterization (N/A, 08/10/2015); transthoracic echocardiogram (08/07/2015); transthoracic echocardiogram (10/2015); Joint replacement; Total knee arthroplasty (Left, 09/23/2016); Breast lumpectomy (Left); Foot Tendon Surgery (Right, ~ 11/2015); Total knee arthroplasty (Left, 09/23/2016); transthoracic echocardiogram (04/05/2019); Back surgery; Anterior cervical decomp/discectomy fusion (N/A, 09/24/2020); and Breast lumpectomy (Left). FAMILY HISTORY Her family history includes Breast cancer in her sister; COPD in her father; Heart attack in her mother; Hyperlipidemia in her sister; Hypertension in her father; Stroke in her mother. SOCIAL HISTORY She  reports that she has never smoked. She has never used smokeless tobacco. She reports current alcohol use. She reports that she does not use drugs.   MEDICARE WELLNESS OBJECTIVES: Physical activity: Current Exercise Habits: Home exercise routine, Type of exercise: walking, Time (Minutes): 30, Frequency (Times/Week): 5, Weekly Exercise (Minutes/Week): 150, Intensity: Mild, Exercise limited by: None identified Cardiac risk factors: Cardiac Risk Factors include: advanced age (>87mn, >>75women);dyslipidemia;hypertension;obesity (BMI >30kg/m2) Depression/mood screen:      06/02/2022    3:05 PM  Depression screen PHQ 2/9  Decreased Interest 0  Down, Depressed, Hopeless 1  PHQ - 2 Score 1    ADLs:     06/02/2022    3:07 PM 08/09/2021    9:10 PM  In your present state of health, do you have any difficulty performing the following activities:  Hearing? 0 0  Vision? 0 0   Difficulty concentrating or making decisions? 0 0  Walking or climbing stairs? 0 0  Dressing or bathing? 0 0  Doing errands, shopping? 0 0     Cognitive Testing  Alert? Yes  Normal Appearance?Yes  Oriented to person? Yes  Place? Yes   Time? Yes  Recall of three objects?  Yes  Can perform simple calculations? Yes  Displays appropriate judgment?Yes  Can read the correct time from a watch face?Yes  EOL planning: Does Patient Have a Medical Advance Directive?: Yes Does patient want to make changes to medical advance directive?: No - Patient declined  Review of Systems  Constitutional:  Negative for malaise/fatigue and weight loss.  HENT:  Negative for hearing loss and tinnitus.   Eyes:  Negative for blurred vision and double vision.  Respiratory:  Negative for cough, sputum production, shortness of breath and wheezing.   Cardiovascular:  Negative for chest pain, palpitations, orthopnea, claudication, leg swelling and PND.  Gastrointestinal:  Positive for constipation. Negative for abdominal pain, blood in stool, diarrhea, heartburn, melena, nausea and vomiting.  Genitourinary: Negative.   Musculoskeletal:  Positive for back pain and joint pain. Negative for falls and myalgias.  Skin:  Negative for rash.  Neurological:  Negative for dizziness, tingling, sensory change, weakness and headaches.  Endo/Heme/Allergies:  Negative for polydipsia.  Psychiatric/Behavioral:  Positive for depression (controlled with meds). Negative for memory loss, substance abuse and suicidal ideas. The patient is not nervous/anxious and does not have insomnia.   All other systems reviewed and are negative.    Objective:     Today's Vitals   06/02/22 1445  BP: (!) 108/58  Pulse: 80  Temp: 97.7 F (36.5 C)  SpO2: 97%  Weight: 197 lb (89.4 kg)  Height: 5' 5"$  (1.651 m)     Body mass index is 32.78 kg/m.  General appearance: alert, no distress, WD/WN, female HEENT: normocephalic, sclerae  anicteric,  TMs pearly, nares patent, no discharge or erythema, pharynx normal Oral cavity: MMM, no lesions Neck: supple, no lymphadenopathy, no thyromegaly, no masses Heart: RRR, normal S1, S2, no murmurs  Lungs: CTA bilaterally, no wheezes, rhonchi, or rales Abdomen: +bs, soft, non tender, non distended, no masses, no hepatomegaly, no splenomegaly Musculoskeletal: nontender, no swelling, no obvious deformity. Right middle digit noticeable arthritic changes Extremities: no edema, no cyanosis, no clubbing  Pulses: 2+ symmetric, upper and lower extremities, normal cap refill Neurological: alert, oriented x 3, CN2-12 intact, strength normal upper extremities and lower extremities, sensation normal throughout, DTRs 2+ throughout, no cerebellar signs, gait normal Psychiatric: normal affect, behavior normal, pleasant   Medicare Attestation I have personally reviewed: The patient's medical and social history Their use of alcohol, tobacco or illicit drugs Their current medications and supplements The patient's functional ability including ADLs,fall risks, home safety risks, cognitive, and hearing and visual impairment Diet and physical activities Evidence for depression or mood disorders  The patient's weight, height, BMI, and visual acuity have been recorded in the chart.  I have made referrals, counseling, and provided education to the patient based on review of the above and I have provided the patient with a written personalized care plan for preventive services.     Alycia Rossetti, NP   06/02/2022

## 2022-06-02 ENCOUNTER — Encounter: Payer: Self-pay | Admitting: Nurse Practitioner

## 2022-06-02 ENCOUNTER — Ambulatory Visit: Payer: Medicare Other | Admitting: Nurse Practitioner

## 2022-06-02 VITALS — BP 108/58 | HR 80 | Temp 97.7°F | Ht 65.0 in | Wt 197.0 lb

## 2022-06-02 DIAGNOSIS — Z79899 Other long term (current) drug therapy: Secondary | ICD-10-CM

## 2022-06-02 DIAGNOSIS — E6609 Other obesity due to excess calories: Secondary | ICD-10-CM

## 2022-06-02 DIAGNOSIS — R7309 Other abnormal glucose: Secondary | ICD-10-CM

## 2022-06-02 DIAGNOSIS — Z0001 Encounter for general adult medical examination with abnormal findings: Secondary | ICD-10-CM | POA: Diagnosis not present

## 2022-06-02 DIAGNOSIS — M5416 Radiculopathy, lumbar region: Secondary | ICD-10-CM

## 2022-06-02 DIAGNOSIS — F419 Anxiety disorder, unspecified: Secondary | ICD-10-CM

## 2022-06-02 DIAGNOSIS — I10 Essential (primary) hypertension: Secondary | ICD-10-CM

## 2022-06-02 DIAGNOSIS — Z6832 Body mass index (BMI) 32.0-32.9, adult: Secondary | ICD-10-CM

## 2022-06-02 DIAGNOSIS — R6889 Other general symptoms and signs: Secondary | ICD-10-CM | POA: Diagnosis not present

## 2022-06-02 DIAGNOSIS — E039 Hypothyroidism, unspecified: Secondary | ICD-10-CM

## 2022-06-02 DIAGNOSIS — I5042 Chronic combined systolic (congestive) and diastolic (congestive) heart failure: Secondary | ICD-10-CM

## 2022-06-02 DIAGNOSIS — E782 Mixed hyperlipidemia: Secondary | ICD-10-CM

## 2022-06-02 DIAGNOSIS — Z8679 Personal history of other diseases of the circulatory system: Secondary | ICD-10-CM

## 2022-06-02 DIAGNOSIS — I471 Supraventricular tachycardia, unspecified: Secondary | ICD-10-CM

## 2022-06-02 DIAGNOSIS — Z8719 Personal history of other diseases of the digestive system: Secondary | ICD-10-CM

## 2022-06-02 DIAGNOSIS — M51369 Other intervertebral disc degeneration, lumbar region without mention of lumbar back pain or lower extremity pain: Secondary | ICD-10-CM

## 2022-06-02 DIAGNOSIS — I480 Paroxysmal atrial fibrillation: Secondary | ICD-10-CM

## 2022-06-02 DIAGNOSIS — Z853 Personal history of malignant neoplasm of breast: Secondary | ICD-10-CM

## 2022-06-02 DIAGNOSIS — Z0000 Encounter for general adult medical examination without abnormal findings: Secondary | ICD-10-CM

## 2022-06-02 DIAGNOSIS — M5136 Other intervertebral disc degeneration, lumbar region: Secondary | ICD-10-CM

## 2022-06-02 DIAGNOSIS — E559 Vitamin D deficiency, unspecified: Secondary | ICD-10-CM

## 2022-06-02 DIAGNOSIS — N1831 Chronic kidney disease, stage 3a: Secondary | ICD-10-CM

## 2022-06-02 DIAGNOSIS — I428 Other cardiomyopathies: Secondary | ICD-10-CM

## 2022-06-02 DIAGNOSIS — F3341 Major depressive disorder, recurrent, in partial remission: Secondary | ICD-10-CM | POA: Diagnosis not present

## 2022-06-02 DIAGNOSIS — F320 Major depressive disorder, single episode, mild: Secondary | ICD-10-CM

## 2022-06-02 DIAGNOSIS — N182 Chronic kidney disease, stage 2 (mild): Secondary | ICD-10-CM

## 2022-06-02 NOTE — Patient Instructions (Signed)

## 2022-06-03 ENCOUNTER — Other Ambulatory Visit: Payer: Self-pay | Admitting: Internal Medicine

## 2022-06-03 DIAGNOSIS — Z1231 Encounter for screening mammogram for malignant neoplasm of breast: Secondary | ICD-10-CM

## 2022-06-06 ENCOUNTER — Other Ambulatory Visit: Payer: Medicare Other

## 2022-06-07 LAB — COMPLETE METABOLIC PANEL WITH GFR
AG Ratio: 1.5 (calc) (ref 1.0–2.5)
ALT: 8 U/L (ref 6–29)
AST: 13 U/L (ref 10–35)
Albumin: 4 g/dL (ref 3.6–5.1)
Alkaline phosphatase (APISO): 82 U/L (ref 37–153)
BUN: 18 mg/dL (ref 7–25)
CO2: 28 mmol/L (ref 20–32)
Calcium: 9.3 mg/dL (ref 8.6–10.4)
Chloride: 103 mmol/L (ref 98–110)
Creat: 0.95 mg/dL (ref 0.60–1.00)
Globulin: 2.6 g/dL (calc) (ref 1.9–3.7)
Glucose, Bld: 111 mg/dL — ABNORMAL HIGH (ref 65–99)
Potassium: 3.6 mmol/L (ref 3.5–5.3)
Sodium: 139 mmol/L (ref 135–146)
Total Bilirubin: 0.2 mg/dL (ref 0.2–1.2)
Total Protein: 6.6 g/dL (ref 6.1–8.1)
eGFR: 62 mL/min/{1.73_m2} (ref >=60)

## 2022-06-07 LAB — CBC WITH DIFFERENTIAL/PLATELET
Absolute Monocytes: 600 cells/uL (ref 200–950)
Basophils Absolute: 90 cells/uL (ref 0–200)
Basophils Relative: 1.2 %
Eosinophils Absolute: 248 cells/uL (ref 15–500)
Eosinophils Relative: 3.3 %
HCT: 31.9 % — ABNORMAL LOW (ref 35.0–45.0)
Hemoglobin: 10.3 g/dL — ABNORMAL LOW (ref 11.7–15.5)
Lymphs Abs: 1650 cells/uL (ref 850–3900)
MCH: 28.8 pg (ref 27.0–33.0)
MCHC: 32.3 g/dL (ref 32.0–36.0)
MCV: 89.1 fL (ref 80.0–100.0)
MPV: 11 fL (ref 7.5–12.5)
Monocytes Relative: 8 %
Neutro Abs: 4913 cells/uL (ref 1500–7800)
Neutrophils Relative %: 65.5 %
Platelets: 295 10*3/uL (ref 140–400)
RBC: 3.58 10*6/uL — ABNORMAL LOW (ref 3.80–5.10)
RDW: 13.3 % (ref 11.0–15.0)
Total Lymphocyte: 22 %
WBC: 7.5 10*3/uL (ref 3.8–10.8)

## 2022-06-07 LAB — LIPID PANEL
Cholesterol: 155 mg/dL (ref <200)
HDL: 53 mg/dL (ref >=50)
LDL Cholesterol (Calc): 77 mg/dL (calc)
Non-HDL Cholesterol (Calc): 102 mg/dL (calc) (ref <130)
Total CHOL/HDL Ratio: 2.9 (calc) (ref <5.0)
Triglycerides: 145 mg/dL (ref <150)

## 2022-06-07 LAB — TSH: TSH: 1.17 mIU/L (ref 0.40–4.50)

## 2022-06-10 NOTE — Progress Notes (Signed)
Cardiology Clinic Note   Patient Name: [name] [name] Date of Encounter: 06/17/2022  Primary Care Provider:  Unk Pinto, MD Primary Cardiologist:  Glenetta Hew, MD  Patient Profile    [name] [name] 77 year old female presents to the clinic today for follow-up evaluation of her nonischemic cardiomyopathy and paroxysmal atrial fibrillation.  Past Medical History    Past Medical History:  Diagnosis Date   Anemia    Anxiety    Arthritis    Chronic back pain    cyst sitting on L4-5;slipped disc   Chronic heart failure with preserved ejection fraction (HFpEF) ([place]) 03/2019   EF improved from 25 and 30% back to 50 to 65% with GR 1 DD.   Depression    takes Cymbalta daily   Diverticulosis    GERD (gastroesophageal reflux disease)    takes Omeprazole daily   Headache(784.0)    rare   History of pancreatitis 03/27/2008   History of ulcer disease 08/06/2015   Hyperlipidemia    Hypertension    takes Cardura nightly and Verapamil daily   Hypothyroidism    Insomnia    takes Restoril nightly   Malignant neoplasm of lower-inner quadrant of left breast in female, estrogen receptor positive ([place]) 05/20/2013   L Breast Cancer   Obesity (BMI 30-39.9) 06/11/2013   Paroxysmal atrial fibrillation ([place]): CHA2DS2-VASc Score - starting Eliquis 08/06/2015   This patients CHA2DS2-VASc Score and unadjusted Ischemic Stroke Rate (% per year) is equal to 4.8 % stroke rate/year from a score of 4  Above score calculated as 1 point each if present [CHF, HTN, DM, Vascular=MI/PAD/Aortic Plaque, Age if 65-74, or Female]; 2 points each if present [Age > 75, or Stroke/TIA/TE]   Personal history of radiation therapy    2015   Pneumonia due to COVID-19 virus 04/05/2019   PONV (postoperative nausea and vomiting)    Pre-diabetes    RESOLVED: Nonischemic cardiomyopathy ([place]) 07/2015   Normal coronary arteries by cath. EF was 25-30% by echo. GR2 DD - likely related to LBBB in setting of A. fib  => EF as of 03/2019: 60 to 65%, no RWMA.  GR 1 DD.   S/P radiation therapy 07/18/2013-08/28/2013   1) Left Breast / 50 Gy in 25 fractions/ 2) Left Breast Boost / 10 Gy in 5 fractions   Shingles    Past Surgical History:  Procedure Laterality Date   ABDOMINAL HYSTERECTOMY     ANTERIOR CERVICAL DECOMP/DISCECTOMY FUSION N/A 09/24/2020   Procedure: ANTERIOR CERVICAL DECOMPRESSION FUSION CERVICAL 5- CERVICAL 6, CERVICAL 6 -  CERVICAL 7 WITH INSTRUMENTATION AND ALLOGRAFT;  Surgeon: Phylliss Bob, MD;  Location: [facility];  Service: Orthopedics;  Laterality: N/A;   BACK SURGERY     BREAST LUMPECTOMY Left    1972 neg   BREAST LUMPECTOMY Left    2015 breast cancer   CARDIAC CATHETERIZATION N/A 08/10/2015   Procedure: Right/Left Heart Cath and Coronary Angiography;  Surgeon: Burnell Blanks, MD;  Location: [facility];  Service: Cardiovascular;: Nonobstructive CAD   CATARACT EXTRACTION BILATERAL W/ ANTERIOR VITRECTOMY Bilateral    CHOLECYSTECTOMY     COLONOSCOPY  2014   DUPUYTREN / PALMAR FASCIOTOMY Bilateral 2007   x 3 to left and once to the right    epidural injections     x 3   FOOT TENDON SURGERY Right ~ 11/2015   "stepped in a hole and tore 2 tendons"   JOINT REPLACEMENT     KNEE ARTHROSCOPY Bilateral  LUMBAR FUSION  10/2013   Dr. Lynann Bologna   TONSILLECTOMY     TOTAL KNEE ARTHROPLASTY Left 09/23/2016   TOTAL KNEE ARTHROPLASTY Left 09/23/2016   Procedure: TOTAL KNEE ARTHROPLASTY;  Surgeon: Dorna Leitz, MD;  Location: [facility];  Service: Orthopedics;  Laterality: Left;   TRANSTHORACIC ECHOCARDIOGRAM  08/07/2015   Severely reduced LVEF at 25-30% with diffuse HK. GR 2 DD. Ventricular dyssynergy secondary to LBBB. Small to moderate pericardial effusion but no hemodynamic compromise   TRANSTHORACIC ECHOCARDIOGRAM  10/2015    (EF up from 25-30%): - Left ventricle: Poor acoustic windows -  difficult to see.    EF estimated at 45-50% with inferoseptal and possible posterior hypokinesis.  GR 1 DD. No pericardial effusion.   TRANSTHORACIC ECHOCARDIOGRAM  04/05/2019   EF 60 to 65% without wall motion normality.  GR 1 DD.  Mild LA dilation.  Trivial MR.  Frequent PVCs noted.   TUBAL LIGATION     UPPER GI ENDOSCOPY      Allergies  Allergies  Allergen Reactions   Requip [Ropinirole Hcl] Shortness Of Breath and Nausea And Vomiting   Minocycline Hives   Tetracyclines & Related Hives   Zestril [Lisinopril] Cough   Zetia [Ezetimibe] Other (See Comments)    MYALGIA JOINT PAIN   Codeine Other (See Comments)    fatigue   Morphine Sulfate Palpitations   Spironolactone Rash   Sulfa Antibiotics Other (See Comments)    Headache (pt states that a blood pressure medicine caused a severe headache but doesn't remember a reaction to sulfa)    History of Present Illness    [name] [name] has a PMH of nonischemic cardiomyopathy EF 40-50% improved to 60-65% on echocardiogram in 2022.  Her PMH also includes LBBB, paroxysmal atrial fibrillation [name], CKD stage III, vertigo, depression, obesity, and knee replacement 12/21.  Spironolactone intolerance (diffuse particular rash and warm type sensation).  She was seen in follow-up by Dr. Ellyn Hack on 10/15/2021.  During that time she continued on Ozempic.  She continued to lose weight.  She denied continued GI symptoms.  She denied falls, presyncope and syncope, and exertional dyspnea.  She also did not episodes of atrial fibrillation.  She did note occasional skipped beats but denied prolonged palpitations.  She presents to the clinic today for follow-up evaluation and states she continues to do well from a cardiac standpoint.  She works in [employer] at [employer].  She works mainly in [employer].  She is no longer on Ozempic.  Her EKG today shows normal sinus rhythm left bundle branch block 82 bpm.  Her blood pressure is well-controlled.  She does note occasional brief episodes of palpitations.  We reviewed triggers for  palpitations and vagal maneuvers.  She expressed understanding.  I will refill her cardiac medications, have her maintain her physical activity, and plan follow-up for 1 year.  Today she denies chest pain, shortness of breath, lower extremity edema, fatigue, palpitations, melena, hematuria, hemoptysis, diaphoresis, weakness, presyncope, syncope, orthopnea, and PND.     Home Medications    Prior to Admission medications   Medication Sig Start Date End Date Taking? Authorizing Provider  acetaminophen (TYLENOL) 500 MG tablet Take 1,000 mg by mouth every 6 (six) hours as needed for moderate pain.    [provider]  buPROPion (WELLBUTRIN XL) 150 MG 24 hr tablet TAKE 1 TABLET BY MOUTH EVERY DAY IN THE MORNING Patient taking differently: Take 150 mg by mouth daily. 10/11/18   Silverio Lay,  Uvaldo Bristle, PA-C  Cholecalciferol (VITAMIN D3) 250 MCG (10000 UT) TABS Take 5,000 Units by mouth daily.    [provider]  COLACE 100 MG capsule Take 100 mg by mouth 2 (two) times daily. 04/28/20   [provider]  [facility] 12 HOUR NASAL DECONGESTANT 120 MG 12 hr tablet TAKE 1 TABLET BY MOUTH TWICE DAILY (EVERY 12 HOURS) FOR HEAD/CHEST CONGESTION & EAR FLUID 06/01/21   Alycia Rossetti, NP  DULoxetine (CYMBALTA) 30 MG capsule TAKE 1 CAPSULE BY MOUTH 3 TIMES A DAY 10/26/21   Alycia Rossetti, NP  ELIQUIS 5 MG TABS tablet TAKE 1 TABLET BY MOUTH TWICE A DAY 05/16/22   Leonie Man, MD  furosemide (LASIX) 20 MG tablet Take 1 tablet 2 x /day  for Fluid Retention & Ankle Swelling 05/11/22   Unk Pinto, MD  gabapentin (NEURONTIN) 300 MG capsule TAKE 1 CAPSULE BY MOUTH TWICE A DAY FOR CHRONIC PAIN 12/18/21   Unk Pinto, MD  hydrOXYzine (ATARAX) 50 MG tablet TAKE 1 TO 2 TABLETS 1 TO 2 HOURS BEFORE BEDTIME AS NEEDED FOR SLEEP 03/29/22   Alycia Rossetti, NP  ipratropium (ATROVENT) 0.03 % nasal spray PLACE 2 SPRAYS INTO THE NOSE EVERY MONDAY, TUESDAY, [place], [place], AND FRIDAY. 01/26/22 01/26/23   Unk Pinto, MD  ketoconazole (NIZORAL) 2 % shampoo USE AS DIRECTED TWICE A WEEK 10/29/20   Alycia Rossetti, NP  levothyroxine (SYNTHROID) 50 MCG tablet TAKE 1/2-1 TABLET DAILY ON EMPTY STOMACH W/ WATER FOR 30 MINS NO ANTACID/MEDS/CALCIUM/MAG. X 4 HOURS 10/27/21   Alycia Rossetti, NP  losartan (COZAAR) 50 MG tablet Take 1 tablet (50 mg total) by mouth daily. 08/16/21   Leonie Man, MD  meclizine (ANTIVERT) 25 MG tablet 1/2-1 pill up to 3 times daily for motion sickness/dizziness. Caution, may cause drowsiness. Patient taking differently: Take 25 mg by mouth 3 (three) times daily as needed for dizziness. 05/24/19   Liane Comber, NP  metoprolol succinate (TOPROL XL) 25 MG 24 hr tablet Take 1 tablet (25 mg total) by mouth daily. 08/16/21   Leonie Man, MD  mometasone (NASONEX) 50 MCG/ACT nasal spray Use 1-2 sprays  to each nostril 1 to 2 x /day Patient taking differently: Place 1-2 sprays into the nose 2 (two) times daily as needed (allergies). 06/22/19   Unk Pinto, MD  nystatin cream (MYCOSTATIN) Apply 1 Application topically 2 (two) times daily. 03/16/22   Darrol Jump, NP  omeprazole (PRILOSEC) 40 MG capsule Take  1 capsule  2 x /day to Prevent Heartburn & Indigestion                                    /                     TAKE                           BY                        MOUTH 11/19/21   Unk Pinto, MD  potassium chloride SA (KLOR-CON M20) 20 MEQ tablet TAKE ONE TABLET DAILY OR AS DIRECTED BY YOUR PROVIDER. Patient taking differently: Take 20 mEq by mouth daily. 10/29/19   Leonie Man, MD  rosuvastatin (CRESTOR) 40 MG tablet TAKE 1 TABLET BY  MOUTH EVERY DAY EXCEPT TAKE 2 TABLETS BY MOUTH ON MONDAYS 12/18/21   Unk Pinto, MD  traMADol Veatrice [place]) 50 MG tablet Take 1 tablet every 4 hours as needed for severe pain 08/10/21   Unk Pinto, MD    Family History    Family History  Problem Relation Age of Onset   Heart attack Mother    Stroke Mother     COPD Father    Hypertension Father    Hyperlipidemia Sister    Breast cancer Sister        79s   Colon polyps Neg Hx    Esophageal cancer Neg Hx    Rectal cancer Neg Hx    Stomach cancer Neg Hx    She indicated that her mother is deceased. She indicated that her father is deceased. She indicated that only one of her two sisters is alive. She indicated that the status of her neg hx is unknown.  Social History    Social History   Socioeconomic History   Marital status: Widowed    Spouse name: Not on file   Number of children: Not on file   Years of education: Not on file   Highest education level: Not on file  Occupational History   Occupation: Systems analyst    Employer: [employer]: Works 5 hours a day.  Very busy  Tobacco Use   Smoking status: Never   Smokeless tobacco: Never  Vaping Use   Vaping Use: Never used  Substance and Sexual Activity   Alcohol use: Yes    Comment: wine occasionally-rarely   Drug use: No   Sexual activity: Not Currently    Birth control/protection: Surgical  Other Topics Concern   Not on file  Social History Narrative   Not on file   Social Determinants of Health   Financial Resource Strain: Not on file  Food Insecurity: Not on file  Transportation Needs: Not on file  Physical Activity: Not on file  Stress: Not on file  Social Connections: Not on file  Intimate Partner Violence: Not on file     Review of Systems    General:  No chills, fever, night sweats or weight changes.  Cardiovascular:  No chest pain, dyspnea on exertion, edema, orthopnea, palpitations, paroxysmal nocturnal dyspnea. Dermatological: No rash, lesions/masses Respiratory: No cough, dyspnea Urologic: No hematuria, dysuria Abdominal:   No nausea, vomiting, diarrhea, bright red blood per rectum, melena, or hematemesis Neurologic:  No visual changes, wkns, changes in mental status. All other systems reviewed and are otherwise negative except  as noted above.  Physical Exam    VS:  BP 113/67 (BP Location: Left Arm, Patient Position: Sitting, Cuff Size: Normal)   Pulse 82   Ht '5\' 5"'$  (1.651 m)   Wt 200 lb (90.7 kg)   SpO2 97%   BMI 33.28 kg/m  , BMI Body mass index is 33.28 kg/m. GEN: Well nourished, well developed, in no acute distress. HEENT: normal. Neck: Supple, no JVD, carotid bruits, or masses. Cardiac: RRR, no murmurs, rubs, or gallops. No clubbing, cyanosis, edema.  Radials/DP/PT 2+ and equal bilaterally.  Respiratory:  Respirations regular and unlabored, clear to auscultation bilaterally. GI: Soft, nontender, nondistended, BS + x 4. MS: no deformity or atrophy. Skin: warm and dry, no rash. Neuro:  Strength and sensation are intact. Psych: Normal affect.  Accessory Clinical Findings    Recent Labs: 02/28/2022: Magnesium 1.9 06/06/2022: ALT 8; BUN 18; Creat 0.95; Hemoglobin 10.3; Platelets  295; Potassium 3.6; Sodium 139; TSH 1.17   Recent Lipid Panel    Component Value Date/Time   CHOL 155 06/06/2022 1444   TRIG 145 06/06/2022 1444   HDL 53 06/06/2022 1444   CHOLHDL 2.9 06/06/2022 1444   VLDL 33 04/04/2019 1715   LDLCALC 77 06/06/2022 1444         ECG personally reviewed by me today-normal sinus rhythm left bundle branch block 82 bpm- No acute changes  Echocardiogram 04/05/2019  IMPRESSIONS     1. Left ventricular ejection fraction, by visual estimation, is 60 to  65%. There is no increased left ventricular wall thickness.   2. Abnormal septal motion consistent with left bundle branch block.   3. Left ventricular diastolic parameters are consistent with Grade I  diastolic dysfunction (impaired relaxation).   4. Global right ventricle has normal systolc function.The right  ventricular size is normal. no increase in right ventricular wall  thickness.   5. Left atrial size was mildly dilated.   6. The mitral valve is normal in structure. Trivial mitral valve  regurgitation.   7. The pulmonic  valve was normal in structure.   8. Normal pulmonary artery systolic pressure.   9. Frequent PVCs noted  10. The inferior vena cava is normal in size with greater than 50%  respiratory variability, suggesting right atrial pressure of 3 mmHg.  11. The tricuspid valve was normal in structure. Tricuspid valve  regurgitation is trivial.  12. Tricuspid valve regurgitation is trivial.   FINDINGS   Left Ventricle: Left ventricular ejection fraction, by visual estimation,  is 60 to 65%. There is no increased left ventricular wall thickness.  Abnormal (paradoxical) septal motion, consistent with left bundle branch  block. Left ventricular diastolic  parameters are consistent with Grade I diastolic dysfunction (impaired  relaxation).   Right Ventricle: The right ventricular size is normal. No increase in  right ventricular wall thickness. Global RV systolic function is has  normal systolic function. The tricuspid regurgitant velocity is 2.05 m/s,  and with an assumed right atrial pressure   of 3 mmHg, the estimated right ventricular systolic pressure is normal at  19.8 mmHg.   Left Atrium: Left atrial size was mildly dilated.   Pericardium: There is no evidence of pericardial effusion is seen. There  is no evidence of pericardial effusion.   Mitral Valve: The mitral valve is normal in structure. MV Area by PHT,  4.06 cm. MV PHT, 54.23 msec. Trivial mitral valve regurgitation.   Tricuspid Valve: The tricuspid valve is normal in structure. Tricuspid  valve regurgitation is trivial.   Pulmonic Valve: The pulmonic valve was normal in structure. Pulmonic valve  regurgitation is trivial by color flow Doppler. Pulmonic regurgitation is  trivial by color flow Doppler.   Venous: The inferior vena cava is normal in size with greater than 50%  respiratory variability, suggesting right atrial pressure of 3 mmHg.      Cardiac catheterization 08/10/2015  1. Non-ischemic cardiomyopathy 2. No  angiographic evidence of CAD 3. Normal filling pressures.    Recommendations: Medical management of non-ischemic cardiomyopathy. New diagnosis of atrial fibrillation. Her cardiomyopathy could be rate related.    Assessment & Plan   1.  Paroxysmal atrial fibrillation-denies recent episodes of accelerated or irregular heartbeat.  Reports compliance with apixaban and denies bleeding issues. Continue metoprolol, apixaban Avoid triggers caffeine, chocolate, EtOH, dehydration etc. Increase physical activity as tolerated  Non-Ischemic cardiomyopathy-no increased DOE or activity intolerance.  Previously felt to be related  to atrial fibrillation and LBBB.  Echocardiogram showed recovered EF.  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 Continue current medical therapy Heart healthy low-sodium diet   Essential hypertension-BP today 113/67 Continue losartan, metoprolol Heart healthy low-sodium diet-salty 6 given Increase physical activity as tolerated  Hyperlipidemia-LDL 77 on 06/06/22. Continue rosuvastatin Heart healthy low-sodium high-fiber diet  PVCs-EKG today shows normal sinus rhythm left bundle branch block 82 bpm Continue metoprolol Maintain p.o. hydration  Obesity-weight today 200 pounds.  Previously took [place]. Calorie reduced diet Increase physical activity as tolerated Follows with PCP  Disposition: Follow-up with Dr. Ellyn Hack or me in 12 months.   Jossie Ng. Windy Dudek NP-C     06/17/2022, 1:38 PM [place] [address] Office [phone] Fax [phone]    I spent 14 minutes examining this patient, reviewing medications, and using patient centered shared decision making involving her cardiac care.  Prior to her visit I spent greater than 20 minutes reviewing her past medical history,  medications, and prior cardiac tests.

## 2022-06-17 ENCOUNTER — Encounter: Payer: Self-pay | Admitting: General Practice

## 2022-06-17 ENCOUNTER — Ambulatory Visit: Payer: Medicare Other | Attending: General Practice | Admitting: General Practice

## 2022-06-17 VITALS — BP 113/67 | HR 82 | Ht 65.0 in | Wt 200.0 lb

## 2022-06-17 DIAGNOSIS — I480 Paroxysmal atrial fibrillation: Secondary | ICD-10-CM

## 2022-06-17 DIAGNOSIS — E782 Mixed hyperlipidemia: Secondary | ICD-10-CM | POA: Diagnosis not present

## 2022-06-17 DIAGNOSIS — I10 Essential (primary) hypertension: Secondary | ICD-10-CM | POA: Diagnosis not present

## 2022-06-17 DIAGNOSIS — I493 Ventricular premature depolarization: Secondary | ICD-10-CM

## 2022-06-17 DIAGNOSIS — I428 Other cardiomyopathies: Secondary | ICD-10-CM | POA: Diagnosis not present

## 2022-06-17 MED ORDER — LOSARTAN POTASSIUM 50 MG PO TABS: 50.0000 mg | ORAL_TABLET | Freq: Every day | ORAL | 3 refills | Status: DC

## 2022-06-17 MED ORDER — POTASSIUM CHLORIDE CRYS ER 20 MEQ PO TBCR: 20.0000 meq | EXTENDED_RELEASE_TABLET | Freq: Every day | ORAL | 3 refills | Status: DC

## 2022-06-17 MED ORDER — FUROSEMIDE 20 MG PO TABS: 20.0000 mg | ORAL_TABLET | Freq: Every day | ORAL | 3 refills | Status: DC

## 2022-06-17 MED ORDER — APIXABAN 5 MG PO TABS: 5.0000 mg | ORAL_TABLET | Freq: Two times a day (BID) | ORAL | 3 refills | Status: DC

## 2022-06-17 MED ORDER — ROSUVASTATIN CALCIUM 40 MG PO TABS: 40.0000 mg | ORAL_TABLET | Freq: Every day | ORAL | 3 refills | Status: DC

## 2022-06-17 MED ORDER — METOPROLOL SUCCINATE ER 25 MG PO TB24: 25.0000 mg | ORAL_TABLET | Freq: Every day | ORAL | 3 refills | Status: DC

## 2022-06-17 NOTE — Patient Instructions (Addendum)
Medication Instructions:   Your physician recommends that you continue on your current medications as directed. Please refer to the Current Medication list given to you today.  *If you need a refill on your cardiac medications before your next appointment, please call your pharmacy*  Lab Work: NONE ordered at this time of appointment   If you have labs (blood work) drawn today and your tests are completely normal, you will receive your results only by: [place] (if you have MyChart) OR A paper copy in the mail If you have any lab test that is abnormal or we need to change your treatment, we will call you to review the results.  Testing/Procedures: NONE ordered at this time of appointment   Follow-Up: At [facility], you and your health needs are our priority.  As part of our continuing mission to provide you with exceptional heart care, we have created designated Provider Care Teams.  These Care Teams include your primary Cardiologist (physician) and Advanced Practice Providers (APPs -  Physician Assistants and Nurse Practitioners) who all work together to provide you with the care you need, when you need it.   Your next appointment:   1 year(s)  Provider:   Glenetta Hew, MD     Other Instructions Maintain physical activity     High-Fiber Eating Plan Fiber, also called dietary fiber, is a type of carbohydrate. It is found foods such as fruits, vegetables, whole grains, and beans. A high-fiber diet can have many health benefits. Your health care provider may recommend a high-fiber diet to help: Prevent constipation. Fiber can make your bowel movements more regular. Lower your cholesterol. Relieve the following conditions: Inflammation of veins in the anus (hemorrhoids). Inflammation of specific areas of the digestive tract (uncomplicated diverticulosis). A problem of the large intestine, also called the colon, that sometimes causes pain and diarrhea (irritable  bowel syndrome, or IBS). Prevent overeating as part of a weight-loss plan. Prevent heart disease, type 2 diabetes, and certain cancers. What are tips for following this plan? Reading food labels  Check the nutrition facts label on food products for the amount of dietary fiber. Choose foods that have 5 grams of fiber or more per serving. The goals for recommended daily fiber intake include: Men (age 50 or younger): 34-38 g. Men (over age 25): 28-34 g. Women (age 80 or younger): 25-28 g. Women (over age 21): 22-25 g. Your daily fiber goal is _____________ g. Shopping Choose whole fruits and vegetables instead of processed forms, such as apple juice or applesauce. Choose a wide variety of high-fiber foods such as avocados, lentils, oats, and kidney beans. Read the nutrition facts label of the foods you choose. Be aware of foods with added fiber. These foods often have high sugar and sodium amounts per serving. Cooking Use whole-grain flour for baking and cooking. Cook with brown rice instead of white rice. Meal planning Start the day with a breakfast that is high in fiber, such as a cereal that contains 5 g of fiber or more per serving. Eat breads and cereals that are made with whole-grain flour instead of refined flour or white flour. Eat brown rice, bulgur wheat, or millet instead of white rice. Use beans in place of meat in soups, salads, and pasta dishes. Be sure that half of the grains you eat each day are whole grains. General information You can get the recommended daily intake of dietary fiber by: Eating a variety of fruits, vegetables, grains, nuts, and beans.  Taking a fiber supplement if you are not able to take in enough fiber in your diet. It is better to get fiber through food than from a supplement. Gradually increase how much fiber you consume. If you increase your intake of dietary fiber too quickly, you may have bloating, cramping, or gas. Drink plenty of water to help  you digest fiber. Choose high-fiber snacks, such as berries, raw vegetables, nuts, and popcorn. What foods should I eat? Fruits Berries. Pears. Apples. Oranges. Avocado. Prunes and raisins. Dried figs. Vegetables Sweet potatoes. Spinach. Kale. Artichokes. Cabbage. Broccoli. Cauliflower. Green peas. Carrots. Squash. Grains Whole-grain breads. Multigrain cereal. Oats and oatmeal. Brown rice. Barley. Bulgur wheat. [place]. Quinoa. Bran muffins. Popcorn. Rye wafer crackers. Meats and other proteins Navy beans, kidney beans, and pinto beans. Soybeans. Split peas. Lentils. Nuts and seeds. Dairy Fiber-fortified yogurt. Beverages Fiber-fortified soy milk. Fiber-fortified orange juice. Other foods Fiber bars. The items listed above may not be a complete list of recommended foods and beverages. Contact a dietitian for more information. What foods should I avoid? Fruits Fruit juice. Cooked, strained fruit. Vegetables Fried potatoes. Canned vegetables. Well-cooked vegetables. Grains White bread. Pasta made with refined flour. White rice. Meats and other proteins Fatty cuts of meat. Fried chicken or fried fish. Dairy Milk. Yogurt. Cream cheese. Sour cream. Fats and oils Butters. Beverages Soft drinks. Other foods Cakes and pastries. The items listed above may not be a complete list of foods and beverages to avoid. Talk with your dietitian about what choices are best for you. Summary Fiber is a type of carbohydrate. It is found in foods such as fruits, vegetables, whole grains, and beans. A high-fiber diet has many benefits. It can help to prevent constipation, lower blood cholesterol, aid weight loss, and reduce your risk of heart disease, diabetes, and certain cancers. Increase your intake of fiber gradually. Increasing fiber too quickly may cause cramping, bloating, and gas. Drink plenty of water while you increase the amount of fiber you consume. The best sources of fiber include whole  fruits and vegetables, whole grains, nuts, seeds, and beans. This information is not intended to replace advice given to you by your health care provider. Make sure you discuss any questions you have with your health care provider. Document Revised: 08/01/2019 Document Reviewed: 08/01/2019 Elsevier Patient Education  [place].

## 2022-06-29 ENCOUNTER — Other Ambulatory Visit: Payer: Self-pay | Admitting: Nurse Practitioner

## 2022-06-29 DIAGNOSIS — B372 Candidiasis of skin and nail: Secondary | ICD-10-CM

## 2022-07-19 ENCOUNTER — Ambulatory Visit
Admission: RE | Admit: 2022-07-19 | Discharge: 2022-07-19 | Disposition: A | Discharge status: Home / Self Care | Payer: Medicare Other | Source: Ambulatory Visit | Attending: Internal Medicine | Admitting: Internal Medicine

## 2022-07-19 DIAGNOSIS — Z1231 Encounter for screening mammogram for malignant neoplasm of breast: Secondary | ICD-10-CM

## 2022-09-04 ENCOUNTER — Other Ambulatory Visit: Payer: Self-pay | Admitting: Internal Medicine

## 2022-09-04 ENCOUNTER — Other Ambulatory Visit: Payer: Self-pay | Admitting: Nurse Practitioner

## 2022-09-04 DIAGNOSIS — N182 Chronic kidney disease, stage 2 (mild): Secondary | ICD-10-CM

## 2022-09-04 DIAGNOSIS — F3341 Major depressive disorder, recurrent, in partial remission: Secondary | ICD-10-CM

## 2022-09-04 DIAGNOSIS — B372 Candidiasis of skin and nail: Secondary | ICD-10-CM

## 2022-09-04 DIAGNOSIS — I5042 Chronic combined systolic (congestive) and diastolic (congestive) heart failure: Secondary | ICD-10-CM

## 2022-09-04 DIAGNOSIS — R7309 Other abnormal glucose: Secondary | ICD-10-CM

## 2022-09-04 DIAGNOSIS — Z0000 Encounter for general adult medical examination without abnormal findings: Secondary | ICD-10-CM

## 2022-09-04 MED ORDER — OZEMPIC (2 MG/DOSE) 8 MG/3ML ~~LOC~~ SOPN: PEN_INJECTOR | SUBCUTANEOUS | 1 refills | Status: DC

## 2022-09-05 ENCOUNTER — Encounter: Payer: Self-pay | Admitting: Internal Medicine

## 2022-09-05 NOTE — Patient Instructions (Signed)

## 2022-09-05 NOTE — Progress Notes (Unsigned)
Future Appointments  Date Time Provider Department  09/06/2022               3 mo f/u  2:30 PM [name], MD [facility]  03/02/2023                cpe  2:00 PM [name], NP [facility]    History of Present Illness:      Patient is a very nice 77 yo WWF with HTN, ASHD /pAfib /combined Ht failure (NICM), HLD, Pre-Diabetes,Vitamin D Deficiency, major Depressive Disorder and episodic vertigo. Patient's depression is in remission  on current meds.         Patient is treated for HTN (1988) & BP has been controlled at home. Today's BP is at goal - 128/78.  In Apr 2017, she was discovered in pAfib [facility] 4) and started [name].  Initial heart cath had a low EF 25-33% and subsequent echo in July 2017  found EF improved at 45-50%. 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 Patient has had no complaints of any cardiac type chest pain, palpitations, dyspnea / orthopnea /PND, dizziness, claudication or dependent edema.                                                  Hyperlipidemia is controlled with diet & meds. Patient denies myalgias or other med SE's. Last Lipids were at goal:   Lab Results  Component Value Date   CHOL 155 06/06/2022   HDL 53 06/06/2022   LDLCALC 77 06/06/2022   TRIG 145 06/06/2022   CHOLHDL 2.9 06/06/2022     Also, the patient has Morbid Obesity  (BMI 39.34+) and consequent PreDiabetes (A1c 6.3% /2017) and has had no symptoms of reactive hypoglycemia, diabetic polys, paresthesias or visual blurring.  Last A1c was still not at goal :   Lab Results  Component Value Date   HGBA1C 5.9 (H) 02/28/2022                                                  In 2016, she was discovered Hypothyroid  and initiated on replacement therapy.                                                                     Further, the patient also has history of Vitamin D Deficiency ("13" /2016) and  supplements vitamin D without any suspected side-effects. Last vitamin D was at goal:   Lab Results  Component Value Date   VD25OH 64 02/28/2022       Current Outpatient Medications  Medication Instructions   acetaminophen    1,000 mg,   Every 6 hours PRN   apixaban (ELIQUIS)   5 mg 2 times daily   buPROPion XL  150 MG  TAKE 1 TABLET BY MOUTH EVERY DAY IN THE MORNING   Colace 100 mg, Oral,  2 times daily   [employer] Decongest TAB 120 MG 12 hr tablet TAKE 1 TABLET TWICE DAILY    DULoxetine  30 mg  3 times daily   furosemide  20 mg  Daily   gabapentin  300 MG capsule TAKE 1 CAPSULE TWICE A DAY    hydrOXYzine 50 MG tablet TAKE 1 TO 2 TABS 1 TO 2 HRS BEFORE BEDTIME    ipratropium  0.03 % nasal spray 2 sprays, Nasal, Every M-T-W-Th-Fr   ketoconazole  2 % shampoo USE AS DIRECTED TWICE A WEEK   levothyroxine  50 MCG tablet Take  1 tablet  Daily     Losartan   50 mg Daily   Meclizine  25 MG tablet 1/2-1 pill up to 3 times daily for motion sickness   metoprolol succinate  XL  25 mg Daily   NASONEX  nasal spray Use 1-2 sprays  to each nostril 1 to 2 x /day   nystatin cream (MYCOSTATIN) APPLY TO AFFECTED AREA TWICE A DAY   omeprazole  40 MG capsule TAKE 1 CAPSULE 2 X /DAY \\   potassium chloride 20 MEQ tablet Daily   rosuvastatin 40 mg   Daily   Semaglutide, 2 MG Inject 2 mg into Skin every 7 days for Diabetes  (Dx: e11.29 )   traMADol 50 MG tablet Take 1 tablet every 4 hours as needed for severe pain   Vitamin D   5,000 Units  Daily     Problem list She has Hyperlipidemia, mixed; Major depressive disorder, recurrent episode, in partial remission (HCC); Essential hypertension; H/O left bundle branch block; Allergic rhinitis; GERD; Radiculopathy; Abnormal glucose; Vitamin D deficiency; Medication management; DDD (degenerative disc disease), lumbar; Encounter for Medicare annual wellness exam; Paroxysmal atrial fibrillation (HCC): CHA2DS2-VASc Score  4 ->  Eliquis; Hypothyroidism; Severe anemia;  (HFpEF) heart failure with preserved ejection fraction (HCC); Non-ischemic cardiomyopathy (HCC); Primary osteoarthritis of left knee; Severe obesity with body mass index (BMI) of 35.0 to 39.9 with comorbidity (HCC); CKD (chronic kidney disease) stage 3, GFR 30-59 ml/min (HCC); Bradycardia; History of COVID-19; 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   Observations/Objective:   There were no vitals taken for this visit.   HEENT - WNL. Neck - supple.  Chest - Clear equal BS. Cor - Nl HS. RRR w/o sig MGR. PP 1(+). No edema. MS- FROM w/o deformities.  Gait Nl. Neuro -  Nl w/o focal abnormalities.  Assessment and Plan:   1. Essential hypertension  - Continue medication, monitor blood pressure at home.  - Continue DASH diet.  Reminder to go to the ER if any CP,  SOB, nausea, dizziness, severe HA, changes vision/speech.   - CBC with Differential/Platelet - COMPLETE METABOLIC PANEL WITH GFR - Magnesium - TSH   2. Hyperlipidemia, mixed  - Continue diet/meds, exercise,& lifestyle modifications.  - Continue monitor periodic cholesterol/liver & renal functions    - Lipid panel - TSH   3. Abnormal glucose   - Continue diet, exercise  - Lifestyle modifications.  - Monitor appropriate labs.   - Hemoglobin A1c - Insulin, random   4. Vitamin D deficiency  - Continue supplementation.   - VITAMIN D 25 Hydroxy    5. Paroxysmal atrial fibrillation (HCC): CHA2DS2-VASc Score  4 ->  Eliquis  - TSH   6. Chronic anticoagulation  - CBC with Differential/Platelet   7. Chronic combined systolic (congestive) and diastolic (congestive)  heart failure (HCC)   8. Major depressive disorder, recurrent episode, in partial remission (HCC)  - TSH   9. Hypothyroidism  - TSH   10. Stage 3a chronic kidney disease (HCC)  - PTH, intact and calcium   11. Medication  management  - CBC with Differential/Platelet - COMPLETE METABOLIC PANEL WITH GFR - Magnesium - Lipid panel - TSH - Hemoglobin A1c - Insulin, random - VITAMIN D 25 Hydroxy  - PTH, intact and calcium   Follow Up Instructions:        I discussed the assessment and treatment plan with the patient.  Patient was strongly cautioned re: postural fall precautions . The patient was provided an opportunity to ask questions and all were answered. The patient agreed with the plan and demonstrated an understanding of the instructions.       The patient was advised to call back or seek an in-person evaluation if the symptoms worsen or if the condition fails to improve as anticipated.   Marinus Maw, MD

## 2022-09-06 ENCOUNTER — Ambulatory Visit (INDEPENDENT_AMBULATORY_CARE_PROVIDER_SITE_OTHER): Payer: Medicare Other | Admitting: Internal Medicine

## 2022-09-06 ENCOUNTER — Telehealth: Payer: Self-pay

## 2022-09-06 ENCOUNTER — Encounter: Payer: Self-pay | Admitting: Internal Medicine

## 2022-09-06 VITALS — BP 118/58 | HR 78 | Temp 97.3°F | Resp 16 | Ht 65.0 in | Wt 209.4 lb

## 2022-09-06 DIAGNOSIS — E039 Hypothyroidism, unspecified: Secondary | ICD-10-CM

## 2022-09-06 DIAGNOSIS — I10 Essential (primary) hypertension: Secondary | ICD-10-CM | POA: Diagnosis not present

## 2022-09-06 DIAGNOSIS — E559 Vitamin D deficiency, unspecified: Secondary | ICD-10-CM | POA: Diagnosis not present

## 2022-09-06 DIAGNOSIS — I5042 Chronic combined systolic (congestive) and diastolic (congestive) heart failure: Secondary | ICD-10-CM

## 2022-09-06 DIAGNOSIS — E782 Mixed hyperlipidemia: Secondary | ICD-10-CM | POA: Diagnosis not present

## 2022-09-06 DIAGNOSIS — N1831 Chronic kidney disease, stage 3a: Secondary | ICD-10-CM

## 2022-09-06 DIAGNOSIS — F3341 Major depressive disorder, recurrent, in partial remission: Secondary | ICD-10-CM

## 2022-09-06 DIAGNOSIS — R7309 Other abnormal glucose: Secondary | ICD-10-CM | POA: Diagnosis not present

## 2022-09-06 DIAGNOSIS — I480 Paroxysmal atrial fibrillation: Secondary | ICD-10-CM

## 2022-09-06 DIAGNOSIS — Z7901 Long term (current) use of anticoagulants: Secondary | ICD-10-CM

## 2022-09-06 DIAGNOSIS — Z79899 Other long term (current) drug therapy: Secondary | ICD-10-CM

## 2022-09-06 LAB — CBC WITH DIFFERENTIAL/PLATELET
Basophils Absolute: 86 cells/uL (ref 0–200)
Hemoglobin: 10.1 g/dL — ABNORMAL LOW (ref 11.7–15.5)
Lymphs Abs: 1498 cells/uL (ref 850–3900)
MCHC: 31.9 g/dL — ABNORMAL LOW (ref 32.0–36.0)
Monocytes Relative: 9 %
Neutro Abs: 4702 cells/uL (ref 1500–7800)
Platelets: 256 10*3/uL (ref 140–400)

## 2022-09-06 NOTE — Telephone Encounter (Signed)
Prior auth approved

## 2022-09-06 NOTE — Telephone Encounter (Signed)
Prior auth completed and submitted. 

## 2022-09-07 LAB — INSULIN, RANDOM: Insulin: 31.3 u[IU]/mL — ABNORMAL HIGH

## 2022-09-07 LAB — HEMOGLOBIN A1C: eAG (mmol/L): 7 mmol/L

## 2022-09-07 LAB — CBC WITH DIFFERENTIAL/PLATELET
Eosinophils Relative: 3.7 %
MCH: 28.8 pg (ref 27.0–33.0)
RBC: 3.51 10*6/uL — ABNORMAL LOW (ref 3.80–5.10)
Total Lymphocyte: 20.8 %

## 2022-09-07 LAB — COMPLETE METABOLIC PANEL WITH GFR
AST: 12 U/L (ref 10–35)
CO2: 25 mmol/L (ref 20–32)
Chloride: 104 mmol/L (ref 98–110)
eGFR: 57 mL/min/{1.73_m2} — ABNORMAL LOW (ref >=60)

## 2022-09-07 LAB — LIPID PANEL
HDL: 57 mg/dL (ref >=50)
Total CHOL/HDL Ratio: 2.7 (calc) (ref <5.0)
Triglycerides: 201 mg/dL — ABNORMAL HIGH (ref <150)

## 2022-09-07 LAB — PTH, INTACT AND CALCIUM: PTH: 69 pg/mL (ref 16–77)

## 2022-09-07 NOTE — Progress Notes (Signed)
^<^<^<^<^<^<^<^<^<^<^<^<^<^<^<^<^<^<^<^<^<^<^<^<^<^<^<^<^<^<^<^<^<^<^<^<^ ^>^>^>^>^>^>^>^>^>^>^>>^>^>^>^>^>^>^>^>^>^>^>^>^>^>^>^>^>^>^>^>^>^>^>^>^>  -   Mild Chronic anemia appears stable   ^<^<^<^<^<^<^<^<^<^<^<^<^<^<^<^<^<^<^<^<^<^<^<^<^<^<^<^<^<^<^<^<^<^<^<^<^ ^>^>^>^>^>^>^>^>^>^>^>^>^>^>^>^>^>^>^>^>^>^>^>^>^>^>^>^>^>^>^>^>^>^>^>^>^  -  Chol = 154   Excellent   - Very low risk for Heart Attack  / Stroke ^<^<^<^<^<^<^<^<^<^<^<^<^<^<^<^<^<^<^<^<^<^<^<^<^<^<^<^<^<^<^<^<^<^<^<^<^ ^>^>^>^>^>^>^>^>^>^>^>^>^>^>^>^>^>^>^>^>^>^>^>^>^>^>^>^>^>^>^>^>^>^>^>^>^  - But Triglycerides (  =  201 ) or fats in blood are too high                 (   Ideal or  Goal is less than 150  !  )    - Recommend avoid fried & greasy foods,  sweets / candy,   - Avoid white rice  (brown or wild rice or Quinoa is OK),   - Avoid white potatoes  (sweet potatoes are OK)   - Avoid anything made from white flour  - bagels, doughnuts, rolls, buns, biscuits, white and   wheat breads, pizza crust and traditional  pasta made of white flour & egg white  - (vegetarian pasta or spinach or wheat pasta is OK).    - Multi-grain bread is OK - like multi-grain flat bread or  sandwich thins.   - Avoid alcohol in excess.   - Exercise is also important.  ^<^<^<^<^<^<^<^<^<^<^<^<^<^<^<^<^<^<^<^<^<^<^<^<^<^<^<^<^<^<^<^<^<^<^<^<^ ^>^>^>^>^>^>^>^>^>^>^>^>^>^>^>^>^>^>^>^>^>^>^>^>^>^>^>^>^>^>^>^>^>^>^>^>^  - A1c = 6.0% is slightly elevated Blood Sugars , So . . .   Recommend  - Avoid Sweets, Candy & White Stuff   - White Rice, White Tamarac, [place] Flour  - Breads &  Pasta  ^<^<^<^<^<^<^<^<^<^<^<^<^<^<^<^<^<^<^<^<^<^<^<^<^<^<^<^<^<^<^<^<^<^<^<^<^ ^>^>^>^>^>^>^>^>^>^>^>^>^>^>^>^>^>^>^>^>^>^>^>^>^>^>^>^>^>^>^>^>^>^>^>^>^  -  All Else - CBC - Kidneys - Electrolytes - Liver - Magnesium & Thyroid    - all  Normal /  OK ^<^<^<^<^<^<^<^<^<^<^<^<^<^<^<^<^<^<^<^<^<^<^<^<^<^<^<^<^<^<^<^<^<^<^<^<^ ^>^>^>^>^>^>^>^>^>^>^>^>^>^>^>^>^>^>^>^>^>^>^>^>^>^>^>^>^>^>^>^>^>^>^>^>^  - Keep up the Great Work  !   ^<^<^<^<^<^<^<^<^<^<^<^<^<^<^<^<^<^<^<^<^<^<^<^<^<^<^<^<^<^<^<^<^<^<^<^<^ ^>^>^>^>^>^>^>^>^>^>^>^>^>^>^>^>^>^>^>^>^>^>^>^>^>^>^>^>^>^>^>^>^>^>^>^>^

## 2022-09-08 LAB — CBC WITH DIFFERENTIAL/PLATELET
Absolute Monocytes: 648 cells/uL (ref 200–950)
Basophils Relative: 1.2 %
Eosinophils Absolute: 266 cells/uL (ref 15–500)
HCT: 31.7 % — ABNORMAL LOW (ref 35.0–45.0)
MCV: 90.3 fL (ref 80.0–100.0)
MPV: 11 fL (ref 7.5–12.5)
Neutrophils Relative %: 65.3 %
RDW: 13.1 % (ref 11.0–15.0)
WBC: 7.2 10*3/uL (ref 3.8–10.8)

## 2022-09-08 LAB — COMPLETE METABOLIC PANEL WITH GFR
AG Ratio: 1.8 (calc) (ref 1.0–2.5)
ALT: 9 U/L (ref 6–29)
Albumin: 4.2 g/dL (ref 3.6–5.1)
Alkaline phosphatase (APISO): 80 U/L (ref 37–153)
BUN/Creatinine Ratio: 24 (calc) — ABNORMAL HIGH (ref 6–22)
BUN: 24 mg/dL (ref 7–25)
Calcium: 9.8 mg/dL (ref 8.6–10.4)
Creat: 1.02 mg/dL — ABNORMAL HIGH (ref 0.60–1.00)
Globulin: 2.3 g/dL (calc) (ref 1.9–3.7)
Glucose, Bld: 95 mg/dL (ref 65–99)
Potassium: 4.3 mmol/L (ref 3.5–5.3)
Sodium: 140 mmol/L (ref 135–146)
Total Bilirubin: 0.2 mg/dL (ref 0.2–1.2)
Total Protein: 6.5 g/dL (ref 6.1–8.1)

## 2022-09-08 LAB — TSH: TSH: 1.52 mIU/L (ref 0.40–4.50)

## 2022-09-08 LAB — HEMOGLOBIN A1C
Hgb A1c MFr Bld: 6 % of total Hgb — ABNORMAL HIGH (ref <5.7)
Mean Plasma Glucose: 126 mg/dL

## 2022-09-08 LAB — LIPID PANEL
Cholesterol: 154 mg/dL (ref <200)
LDL Cholesterol (Calc): 69 mg/dL (calc)
Non-HDL Cholesterol (Calc): 97 mg/dL (calc) (ref <130)

## 2022-09-08 LAB — PTH, INTACT AND CALCIUM: Calcium: 9.8 mg/dL (ref 8.6–10.4)

## 2022-09-08 LAB — VITAMIN D 25 HYDROXY (VIT D DEFICIENCY, FRACTURES): Vit D, 25-Hydroxy: 48 ng/mL (ref 30–100)

## 2022-09-08 LAB — MAGNESIUM: Magnesium: 2 mg/dL (ref 1.5–2.5)

## 2022-10-17 ENCOUNTER — Other Ambulatory Visit: Payer: Self-pay | Admitting: Internal Medicine

## 2022-10-17 DIAGNOSIS — R112 Nausea with vomiting, unspecified: Secondary | ICD-10-CM

## 2022-10-17 MED ORDER — ONDANSETRON HCL 8 MG PO TABS
ORAL_TABLET | ORAL | 0 refills | Status: DC
Start: 2022-10-17 — End: 2023-03-20

## 2022-12-13 ENCOUNTER — Ambulatory Visit (INDEPENDENT_AMBULATORY_CARE_PROVIDER_SITE_OTHER): Payer: Medicare Other | Admitting: Nurse Practitioner

## 2022-12-13 ENCOUNTER — Encounter: Payer: Self-pay | Admitting: Nurse Practitioner

## 2022-12-13 VITALS — BP 128/64 | HR 74 | Temp 97.5°F | Ht 65.0 in | Wt 219.2 lb

## 2022-12-13 DIAGNOSIS — E559 Vitamin D deficiency, unspecified: Secondary | ICD-10-CM

## 2022-12-13 DIAGNOSIS — E039 Hypothyroidism, unspecified: Secondary | ICD-10-CM

## 2022-12-13 DIAGNOSIS — I471 Supraventricular tachycardia, unspecified: Secondary | ICD-10-CM

## 2022-12-13 DIAGNOSIS — M5136 Other intervertebral disc degeneration, lumbar region: Secondary | ICD-10-CM

## 2022-12-13 DIAGNOSIS — Z79899 Other long term (current) drug therapy: Secondary | ICD-10-CM

## 2022-12-13 DIAGNOSIS — R7309 Other abnormal glucose: Secondary | ICD-10-CM

## 2022-12-13 DIAGNOSIS — I5042 Chronic combined systolic (congestive) and diastolic (congestive) heart failure: Secondary | ICD-10-CM

## 2022-12-13 DIAGNOSIS — I480 Paroxysmal atrial fibrillation: Secondary | ICD-10-CM | POA: Diagnosis not present

## 2022-12-13 DIAGNOSIS — I10 Essential (primary) hypertension: Secondary | ICD-10-CM

## 2022-12-13 DIAGNOSIS — D509 Iron deficiency anemia, unspecified: Secondary | ICD-10-CM

## 2022-12-13 DIAGNOSIS — M51369 Other intervertebral disc degeneration, lumbar region without mention of lumbar back pain or lower extremity pain: Secondary | ICD-10-CM

## 2022-12-13 DIAGNOSIS — E782 Mixed hyperlipidemia: Secondary | ICD-10-CM

## 2022-12-13 DIAGNOSIS — N1831 Chronic kidney disease, stage 3a: Secondary | ICD-10-CM

## 2022-12-13 DIAGNOSIS — F3341 Major depressive disorder, recurrent, in partial remission: Secondary | ICD-10-CM

## 2022-12-13 DIAGNOSIS — M5416 Radiculopathy, lumbar region: Secondary | ICD-10-CM

## 2022-12-13 MED ORDER — TRAMADOL HCL 50 MG PO TABS
ORAL_TABLET | ORAL | 0 refills | Status: DC
Start: 2022-12-13 — End: 2024-01-01

## 2022-12-13 MED ORDER — GABAPENTIN 300 MG PO CAPS
ORAL_CAPSULE | ORAL | 1 refills | Status: AC
Start: 2022-12-13 — End: ?

## 2022-12-13 NOTE — Patient Instructions (Signed)
Degenerative Disk Disease  Degenerative disk disease is a condition caused by changes that occur in the spinal disks as a person ages. Spinal disks are soft and compressible disks located between the bones of your spine (vertebrae). These disks act like shock absorbers. Degenerative disk disease can affect the whole spine. However, the neck and lower back are most often affected. Many changes can occur in the spinal disks with aging, such as: The spinal disks may dry and shrink. Small tears may occur in the tough, outer covering of the disk (annulus). The disk space may become smaller due to loss of water. Abnormal growths in the bone (spurs) may occur. This can put pressure on the nerve roots exiting the spinal canal, causing pain. The spinal canal may become narrowed. What are the causes? This condition may be caused by: Normal degeneration with age. Injuries. Certain activities and sports that cause damage. What increases the risk? The following factors may make you more likely to develop this condition: Being overweight. Having a family history of degenerative disk disease. Smoking and use of products that contain nicotine and tobacco. Sudden injury. Doing work that requires heavy lifting. What are the signs or symptoms? Symptoms of this condition include: Pain that varies in intensity. Some people have no pain, while others have severe pain. The location of the pain depends on the part of your backbone that is affected. You may have: Pain in your neck or arm if a disk in your neck area is affected. Pain in your back, buttocks, or legs if a disk in your lower back is affected. Pain that becomes worse while bending or reaching up, or with twisting movements. Pain that may start gradually and worsen as time passes. It may also start after a major or minor injury. Numbness or tingling in the arms or legs. How is this diagnosed? This condition may be diagnosed based on: Your symptoms  and medical history. A physical exam. Imaging tests, including: X-ray of the spine. CT scan. MRI. How is this treated? This condition may be treated with: Medicines. Injection of steroids into the back. Rehabilitation exercises. These activities aim to strengthen muscles in your back and abdomen to better support your spine. If treatments do not help to relieve your symptoms or you have severe pain, you may need surgery. Follow these instructions at home: Medicines Take over-the-counter and prescription medicines only as told by your health care provider. Ask your health care provider if the medicine prescribed to you: Requires you to avoid driving or using machinery. Can cause constipation. You may need to take these actions to prevent or treat constipation: Drink enough fluid to keep your urine pale yellow. Take over-the-counter or prescription medicines. Eat foods that are high in fiber, such as beans, whole grains, and fresh fruits and vegetables. Limit foods that are high in fat and processed sugars, such as fried or sweet foods. Activity Rest as told by your health care provider. Avoid sitting for a long time without moving. Get up to take short walks every 1-2 hours. This is important to improve blood flow and breathing. Ask for help if you feel weak or unsteady. Return to your normal activities as told by your health care provider. Ask your health care provider what activities are safe for you. Perform relaxation exercises as told by your health care provider. Maintain good posture. Do not lift anything that is heavier than 10 lb (4.5 kg), or the limit that you are told, until your health   care provider says that it is safe. Follow proper lifting and walking techniques as told by your health care provider. Managing pain, stiffness, and swelling     If directed, put ice on the painful area. Icing can help to relieve pain. To do this: Put ice in a plastic bag. Place a towel  between your skin and the bag. Leave the ice on for 20 minutes, 2-3 times a day. Remove the ice if your skin turns bright red. This is very important. If you cannot feel pain, heat, or cold, you have a greater risk of damage to the area. If directed, apply heat to the painful area as often as told by your health care provider. Heat can reduce the stiffness of your muscles. Use the heat source that your health care provider recommends, such as a moist heat pack or a heating pad. Place a towel between your skin and the heat source. Leave the heat on for 20-30 minutes. Remove the heat if your skin turns bright red. This is especially important if you are unable to feel pain, heat, or cold. You may have a greater risk of getting burned. General instructions Change your sitting, standing, and sleeping habits as told by your health care provider. Avoid sitting in the same position for long periods of time. Change positions frequently. Lose weight or maintain a healthy weight as told by your health care provider. Do not use any products that contain nicotine or tobacco, such as cigarettes, e-cigarettes, and chewing tobacco. If you need help quitting, ask your health care provider. Wear supportive footwear. Keep all follow-up visits. This is important. This may include visits for physical therapy. Contact a health care provider if you: Have pain that does not go away within 1-4 weeks. Lose your appetite. Lose weight without trying. Get help right away if you: Have severe pain. Notice weakness in your arms, hands, or legs. Begin to lose control of your bladder or bowel movements. Have fevers or night sweats. Summary Degenerative disk disease is a condition caused by changes that occur in the spinal disks as a person ages. This condition can affect the whole spine. However, the neck and lower back are most often affected. Take over-the-counter and prescription medicines only as told by your health  care provider. This information is not intended to replace advice given to you by your health care provider. Make sure you discuss any questions you have with your health care provider. Document Revised: 07/11/2019 Document Reviewed: 07/11/2019 [employer] Patient Education  2024 [employer]  

## 2022-12-13 NOTE — Progress Notes (Signed)
3 MONTH FOLLOW UP  Assessment:    Essential hypertension Continue current medications:Metoprolol 50mg  daily, Losartan 50 mg every day ,Lasix 20mg  daily with potassium Monitor blood pressure at home; call if consistently over 130/80 Continue DASH diet.   Reminder to go to the ER if any CP, SOB, nausea, dizziness, severe HA, changes vision/speech, left arm numbness and tingling and jaw pain. -     CBC with Differential/Platelet -     CMP/GFR -     TSH  Paroxysmal atrial fibrillation [facility]): CHA2DS2-VASc Score  Control blood pressure, cholesterol, glucose, increase exercise.  Continue cardio follow up Continue Eliquis and Metoprolol  Chronic combined systolic and diastolic heart failure, NYHA class 2 (HCC) -- exacerbated by A. Fib Weight stable, appears euvoluemic, followed by cardiology  Non-ischemic cardiomyopathy (HCC) Weight stable at home Continue to monitor  Paroxysmal supraventricular tachycardia (HCC) Control blood pressure, cholesterol, glucose, increase exercise.  Continue cardio follow up   ANXIETY DEPRESSION Continue medications: bupropion XL 150 daily, cymbalta 30mg  TID Discussed stress management techniques  Discussed, increase water,intake & good sleep hygiene  Discussed increasing exercise & vegetables in diet  Gastroesophageal reflux disease without esophagitis Continue PPI/H2 blocker, diet discussed  CKD3a Increase fluids  Avoid NSAIDS Blood pressure control Monitor sugars  Will continue to monitor  Other specified hypothyroidism Taking levothyroxine 50 mcg daily Reminder to take on an empty stomach 30-63mins before first meal of the day. No antacid medications for 4 hours. -     TSH  Lumbar radiculopathy Follow up ortho/pain management PRN Continue Gabapentin, rare use of Tramadol  DDD (degenerative disc disease), lumbar Follow up ortho PRN Continue Gabapentin, rare use of Tramadol  Hyperlipidemia -continue medications, check lipids,  decrease fatty foods, increase activity.  -     Lipid panel  H/O left bundle branch block Continue cardio follow up  History of pancreatitis Monitor, no ETOH  Hx of  -  Malignant neoplasm of lower-inner quadrant of left breast in female, estrogen receptor positive  S/p lumpectomy and 5 year anastrozole, has been released Monitoring by annual mammograms 07/19/22  Morbid Obesity BMI 36  with co morbidities (HCC) - PAF, CHF, HTN, HLD - long discussion about weight loss, diet, and exercise  - Continue Ozempic may need to wean back to 1 mg dose - Decrease saturated fats and simple carbs. Increase lean proteins and exercise  Abnormal Glucose Discussed general issues about diabetes pathophysiology and management., Educational material distributed., Suggested low cholesterol diet., Encouraged aerobic exercise., Discussed foot care., Reminded to get yearly retinal exam. Begin Ozempic 2 mg once a week, if not controlling appetite increase to 0.5mg  weekly   Vitamin D deficiency At goal at recent check; continue to recommend supplementation for goal of 60-100 Defer vitamin D level  History of ulcer disease Monitor, avoid NSAIDS, continue GERD meds   Anemia Chronic, stable, monitor; recent iron was normal, stop supplement, get on B12 sublingual which was low. UTD on colonoscopy.  - Iron/ferritin panel   Over 40 minutes of face to face interview, exam, counseling, chart review and critical decision making was performed Future Appointments  Date Time Provider Department Center  03/20/2023  2:00 PM [name], NP [facility] None  06/21/2023  2:30 PM [name], NP [facility] None  09/25/2023  2:00 PM [name], MD [facility] None      Subjective:  [name] [name] is a 77 y.o. female who presents for 3 month follow up.   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 Overall doing well.  She has hx of R breast cancer in 2015, s/p lumpectomy, completed 5 years of anastrozole and was  released by Dr. Darnelle Catalan, now following with annual mammograms.   She no longer follows with Dr. Daleen Squibb Dr. Yevette Edwards (pain management) for mid back pain with radiculopathy .  She is using Tramadol 50 mg q 4 hours as needed, Tylenol 550 2 tabs 4 times a day as needed and Gabapentin 300 mg BID.   she has a diagnosis of depression/anxiety and is currently on wellbutrin 150 mg, cymbalta 30 mg bid, reports symptoms are typically well controlled with this regimen.. she also takes temazepam PRN insomnia daily, hasn't done well with attempts to taper.   she has a diagnosis of GERD with history of ulcer, which is currently managed by omeprazole 40 mg and famotidine PRN breakthrough.  she reports symptoms is currently well controlled, and denies breakthrough reflux, burning in chest, hoarseness or cough.    BMI is Body mass index is 36.48 kg/m., she has not been working on diet and exercise, works all day on her feet with walking and lifting at [employer].  Wt Readings from Last 3 Encounters:  12/13/22 219 lb 3.2 oz (99.4 kg)  09/06/22 209 lb 6.4 oz (95 kg)  06/17/22 200 lb (90.7 kg)   She has hx of a. Fib/PSVT, on elequis, non-ischemic cardiomyopathy, diastolic CHF improved on ARB/BB, most recent ECHO with EF 60-65%, followed by cardiology Dr. Herbie Baltimore.  Had benign holter results in Feb 2021.   Her blood pressure has been controlled at home,currently on Metoprolol 50mg  daily, Losartan 50 mg every day ,Lasix 20mg  daily.  Today their BP is BP: 128/64  BP Readings from Last 3 Encounters:  12/13/22 128/64  09/06/22 (!) 118/58  06/17/22 113/67  She does not workout. She denies chest pain, shortness of breath, dizziness.   She is on cholesterol medication (rosuvastatin 40 mg daily) and denies myalgias. Her cholesterol is at goal. The cholesterol last visit was:   Lab Results  Component Value Date   CHOL 154 09/06/2022   HDL 57 09/06/2022   LDLCALC 69 09/06/2022   TRIG 201 (H) 09/06/2022   CHOLHDL 2.7  09/06/2022    She has not been working on diet and exercise for prediabetes, and denies foot ulcerations, increased appetite, nausea, paresthesia of the feet, polydipsia, polyuria, visual disturbances, vomiting and weight loss. She restarted Ozempic but at the 2 mg/week dose.  She had nausea and vomiting. Last A1C in the office was:  Lab Results  Component Value Date   HGBA1C 6.0 (H) 09/06/2022   She has CKD III monitored at this office. Last GFR:  Lab Results  Component Value Date   EGFR 57 (L) 09/06/2022    Patient is on Vitamin D supplement.   Lab Results  Component Value Date   VD25OH 48 09/06/2022       Medication Review: Current Outpatient Medications on File Prior to Visit  Medication Sig Dispense Refill   acetaminophen (TYLENOL) 500 MG tablet Take 1,000 mg by mouth every 6 (six) hours as needed for moderate pain.     apixaban (ELIQUIS) 5 MG TABS tablet Take 1 tablet (5 mg total) by mouth 2 (two) times daily. 180 tablet 3   buPROPion (WELLBUTRIN XL) 150 MG 24 hr tablet TAKE 1 TABLET BY MOUTH EVERY DAY IN THE MORNING (Patient taking differently: Take 150 mg by mouth daily.) 90 tablet 1   Cholecalciferol (VITAMIN D3) 250 MCG (10000  UT) TABS Take 5,000 Units by mouth daily.     COLACE 100 MG capsule Take 100 mg by mouth 2 (two) times daily.     [facility] 12 HOUR NASAL DECONGESTANT 120 MG 12 hr tablet TAKE 1 TABLET BY MOUTH TWICE DAILY (EVERY 12 HOURS) FOR HEAD/CHEST CONGESTION & EAR FLUID 60 tablet 3   DULoxetine (CYMBALTA) 30 MG capsule TAKE 1 CAPSULE BY MOUTH 3 TIMES A DAY 270 capsule 3   furosemide (LASIX) 20 MG tablet Take 1 tablet (20 mg total) by mouth daily. 90 tablet 3   gabapentin (NEURONTIN) 300 MG capsule TAKE 1 CAPSULE BY MOUTH TWICE A DAY FOR CHRONIC PAIN 180 capsule 1   hydrOXYzine (ATARAX) 50 MG tablet TAKE 1 TO 2 TABLETS 1 TO 2 HOURS BEFORE BEDTIME AS NEEDED FOR SLEEP 180 tablet 2   ipratropium (ATROVENT) 0.03 % nasal spray PLACE 2 SPRAYS INTO THE NOSE EVERY MONDAY,  TUESDAY, WEDNESDAY, THURSDAY, AND FRIDAY. 90 mL 3   ketoconazole (NIZORAL) 2 % shampoo USE AS DIRECTED TWICE A WEEK 120 mL 0   levothyroxine (SYNTHROID) 50 MCG tablet Take  1 tablet  Daily  on an empty stomach with only water for 30 minutes & no Antacid meds, Calcium or Magnesium for 4 hours & avoid Biotin                                                                   /                                             TAKE                                                             BY                                                        MOUTH 90 tablet 3   losartan (COZAAR) 50 MG tablet Take 1 tablet (50 mg total) by mouth daily. 90 tablet 3   meclizine (ANTIVERT) 25 MG tablet 1/2-1 pill up to 3 times daily for motion sickness/dizziness. Caution, may cause drowsiness. (Patient taking differently: Take 25 mg by mouth 3 (three) times daily as needed for dizziness.) 60 tablet 0   metoprolol succinate (TOPROL XL) 25 MG 24 hr tablet Take 1 tablet (25 mg total) by mouth daily. 90 tablet 3   mometasone (NASONEX) 50 MCG/ACT nasal spray Use 1-2 sprays  to each nostril 1 to 2 x /day (Patient taking differently: Place 1-2 sprays into the nose 2 (two) times daily as needed (allergies).) 51 g 3   nystatin cream (MYCOSTATIN) APPLY TO AFFECTED AREA TWICE A DAY 90 g 3   omeprazole (PRILOSEC) 40 MG capsule TAKE 1 CAPSULE 2 X /  DAY TO PREVENT HEARTBURN & INDIGESTION 180 capsule 3   ondansetron (ZOFRAN) 8 MG tablet Take 1/2 to 1 3 x /day before meals as needed for Nausea 90 tablet 0   potassium chloride SA (KLOR-CON M20) 20 MEQ tablet Take 1 tablet (20 mEq total) by mouth daily. 90 tablet 3   rosuvastatin (CRESTOR) 40 MG tablet Take 1 tablet (40 mg total) by mouth daily. 90 tablet 3   Semaglutide, 2 MG/DOSE, (OZEMPIC, 2 MG/DOSE,) 8 MG/3ML SOPN Inject 2 mg into Skin every 7 days for Diabetes  (Dx: e11.29 ) 9 mL 1   traMADol (ULTRAM) 50 MG tablet Take 1 tablet every 4 hours as needed for severe pain 30 tablet 0   Current  Facility-Administered Medications on File Prior to Visit  Medication Dose Route Frequency Provider Last Rate Last Admin   ipratropium-albuterol (DUONEB) 0.5-2.5 (3) MG/3ML nebulizer solution 3 mL  3 mL Nebulization Once Cranford, Tonya, NP        Allergies  Allergen Reactions   Requip [Ropinirole Hcl] Shortness Of Breath and Nausea And Vomiting   Minocycline Hives   Tetracyclines & Related Hives   Zestril [Lisinopril] Cough   Zetia [Ezetimibe] Other (See Comments)    MYALGIA JOINT PAIN   Codeine Other (See Comments)    fatigue   Morphine Sulfate Palpitations   Spironolactone Rash   Sulfa Antibiotics Other (See Comments)    Headache (pt states that a blood pressure medicine caused a severe headache but doesn't remember a reaction to sulfa)    Current Problems (verified) Patient Active Problem List   Diagnosis Date Noted   History of breast cancer 09/06/2019   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 04/30/2019   Bradycardia    History of COVID-19    CKD (chronic kidney disease) stage 3, GFR 30-59 ml/min (HCC) 09/21/2018   Severe obesity with body mass index (BMI) of 35.0 to 39.9 with comorbidity (HCC) 06/14/2017   Primary osteoarthritis of left knee 09/23/2016   Non-ischemic cardiomyopathy (HCC)    (HFpEF) heart failure with preserved ejection fraction (HCC) 08/08/2015   Paroxysmal atrial fibrillation (HCC): CHA2DS2-VASc Score  4 ->  Eliquis 08/06/2015   Hypothyroidism 08/06/2015   Severe anemia    DDD (degenerative disc disease), lumbar 06/16/2014   Abnormal glucose 12/18/2013   Vitamin D deficiency 12/18/2013   Hyperlipidemia, mixed 03/13/2008   Major depressive disorder, recurrent episode, in partial remission (HCC) 03/13/2008   Essential hypertension 03/13/2008   H/O left bundle branch block 03/13/2008   Allergic rhinitis 03/13/2008   GERD 03/13/2008    Screening Tests Health Maintenance  Topic Date Due   Zoster Vaccines- Shingrix (2 of 2) 04/22/2022   INFLUENZA VACCINE   11/10/2022   COVID-19 Vaccine (5 - 2023-24 season) 12/11/2022   Medicare Annual Wellness (AWV)  09/04/2023   DTaP/Tdap/Td (3 - Td or Tdap) 09/23/2028   Pneumonia Vaccine 73+ Years old  Completed   DEXA SCAN  Completed   Hepatitis C Screening  Completed   HPV VACCINES  Aged Out   Colonoscopy  Discontinued    Immunization History  Administered Date(s) Administered   Fluad Quad(high Dose 65+) 01/15/2020   Influenza Whole 01/01/2013   Influenza, High Dose Seasonal PF 01/29/2014, 12/30/2014, 01/17/2019, 01/14/2021, 02/11/2022   Influenza,inj,Quad PF,6+ Mos 01/18/2018   Influenza-Unspecified 12/11/2015, 02/04/2017, 01/18/2018   PFIZER(Purple Top)SARS-COV-2 Vaccination 05/18/2019, 06/10/2019, 01/31/2020   PNEUMOCOCCAL CONJUGATE-20 02/25/2022   Pfizer Covid-19 Vaccine Bivalent Booster 45yrs & up 04/28/2021   Pneumococcal Conjugate-13 06/16/2014   Pneumococcal Polysaccharide-23  05/23/2011   Td 09/24/2018   Tdap 03/11/2008   Zoster Recombinant(Shingrix) 02/25/2022   Zoster, Live 02/02/2010    Names of Other Physician/Practitioners you currently use: 1. [facility] here for primary care 2. Dr Lorin Picket, eye doctor, last visit 2021, encouraged to schedule 3. [facility], dentist, last visit 02/2021  Patient Care Team: [name], MD as PCP - General (Internal Medicine) Marykay Lex, MD as PCP - Cardiology (Cardiology) Magrinat, Valentino Hue, MD (Inactive) as Consulting Physician (Oncology) Marykay Lex, MD as Consulting Physician (Cardiology) Jodi Geralds, MD as Consulting Physician (Orthopedic Surgery) Emelia Loron, MD as Consulting Physician (General Surgery) Lonie Peak, MD as Attending Physician (Radiation Oncology)  SURGICAL HISTORY She  has a past surgical history that includes Cataract extraction bilateral w/ anterior vitrectomy (Bilateral); Abdominal hysterectomy; Cholecystectomy; Knee arthroscopy (Bilateral);  Tonsillectomy; Colonoscopy (2014); Upper gi endoscopy; Dupuytren / palmar fasciotomy (Bilateral, 2007); Tubal ligation; epidural injections; Lumbar fusion (10/2013); Cardiac catheterization (N/A, 08/10/2015); transthoracic echocardiogram (08/07/2015); transthoracic echocardiogram (10/2015); Joint replacement; Total knee arthroplasty (Left, 09/23/2016); Breast lumpectomy (Left); Foot Tendon Surgery (Right, ~ 11/2015); Total knee arthroplasty (Left, 09/23/2016); transthoracic echocardiogram (04/05/2019); Back surgery; Anterior cervical decomp/discectomy fusion (N/A, 09/24/2020); and Breast lumpectomy (Left). FAMILY HISTORY Her family history includes Breast cancer in her sister; COPD in her father; Heart attack in her mother; Hyperlipidemia in her sister; Hypertension in her father; Stroke in her mother. SOCIAL HISTORY She  reports that she has never smoked. She has never used smokeless tobacco. She reports current alcohol use. She reports that she does not use drugs.   Review of Systems  Constitutional:  Positive for malaise/fatigue. Negative for weight loss.  HENT:  Negative for hearing loss and tinnitus.   Eyes:  Negative for blurred vision and double vision.  Respiratory:  Negative for cough, sputum production, shortness of breath and wheezing.   Cardiovascular:  Negative for chest pain, palpitations, orthopnea, claudication, leg swelling and PND.  Gastrointestinal:  Positive for constipation. Negative for abdominal pain, blood in stool, diarrhea, heartburn, melena, nausea and vomiting.  Genitourinary: Negative.   Musculoskeletal:  Positive for back pain and joint pain. Negative for falls and myalgias.  Skin:  Negative for rash.  Neurological:  Negative for dizziness, tingling, sensory change, weakness and headaches.  Endo/Heme/Allergies:  Negative for polydipsia.  Psychiatric/Behavioral:  Positive for depression. Negative for memory loss, substance abuse and suicidal ideas. The patient is not  nervous/anxious and does not have insomnia.   All other systems reviewed and are negative.    Objective:     Today's Vitals   12/13/22 1434  BP: 128/64  Pulse: 74  Temp: (!) 97.5 F (36.4 C)  SpO2: 95%  Weight: 219 lb 3.2 oz (99.4 kg)  Height: 5\' 5"  (1.651 m)     Body mass index is 36.48 kg/m.  General appearance: alert, no distress, WD/WN, female HEENT: normocephalic, sclerae anicteric, TMs pearly, nares patent, no discharge or erythema, pharynx normal Oral cavity: MMM, no lesions Neck: supple, no lymphadenopathy, no thyromegaly, no masses Heart: RRR, normal S1, S2, no murmurs  Lungs: CTA bilaterally, no wheezes, rhonchi, or rales Abdomen: +bs, soft, non tender, non distended, no masses, no hepatomegaly, no splenomegaly Musculoskeletal: nontender, no swelling, no obvious deformity. Tenderness left thoracic area.  Extremities: no edema, no cyanosis, no clubbing Pulses: 2+ symmetric, upper and lower extremities, normal cap refill Neurological: alert, oriented x 3, CN2-12 intact, strength normal upper extremities and lower extremities, sensation normal throughout, DTRs 2+  throughout, no cerebellar signs, gait normal Psychiatric: normal affect, behavior normal, pleasant     [name], NP   12/13/2022

## 2022-12-14 LAB — CBC WITH DIFFERENTIAL/PLATELET
Absolute Monocytes: 638 {cells}/uL (ref 200–950)
Basophils Absolute: 120 {cells}/uL (ref 0–200)
Basophils Relative: 1.6 %
Eosinophils Absolute: 308 {cells}/uL (ref 15–500)
Eosinophils Relative: 4.1 %
HCT: 31.2 % — ABNORMAL LOW (ref 35.0–45.0)
Hemoglobin: 10.2 g/dL — ABNORMAL LOW (ref 11.7–15.5)
Lymphs Abs: 1800 {cells}/uL (ref 850–3900)
MCH: 28.5 pg (ref 27.0–33.0)
MCHC: 32.7 g/dL (ref 32.0–36.0)
MCV: 87.2 fL (ref 80.0–100.0)
MPV: 11.1 fL (ref 7.5–12.5)
Monocytes Relative: 8.5 %
Neutro Abs: 4635 {cells}/uL (ref 1500–7800)
Neutrophils Relative %: 61.8 %
Platelets: 299 10*3/uL (ref 140–400)
RBC: 3.58 10*6/uL — ABNORMAL LOW (ref 3.80–5.10)
RDW: 13.2 % (ref 11.0–15.0)
Total Lymphocyte: 24 %
WBC: 7.5 10*3/uL (ref 3.8–10.8)

## 2022-12-14 LAB — COMPLETE METABOLIC PANEL WITH GFR
AG Ratio: 1.9 (calc) (ref 1.0–2.5)
ALT: 9 U/L (ref 6–29)
AST: 13 U/L (ref 10–35)
Albumin: 4.3 g/dL (ref 3.6–5.1)
Alkaline phosphatase (APISO): 81 U/L (ref 37–153)
BUN/Creatinine Ratio: 15 (calc) (ref 6–22)
BUN: 18 mg/dL (ref 7–25)
CO2: 27 mmol/L (ref 20–32)
Calcium: 9.4 mg/dL (ref 8.6–10.4)
Chloride: 102 mmol/L (ref 98–110)
Creat: 1.21 mg/dL — ABNORMAL HIGH (ref 0.60–1.00)
Globulin: 2.3 g/dL (ref 1.9–3.7)
Glucose, Bld: 91 mg/dL (ref 65–99)
Potassium: 4.1 mmol/L (ref 3.5–5.3)
Sodium: 138 mmol/L (ref 135–146)
Total Bilirubin: 0.2 mg/dL (ref 0.2–1.2)
Total Protein: 6.6 g/dL (ref 6.1–8.1)
eGFR: 46 mL/min/{1.73_m2} — ABNORMAL LOW (ref >=60)

## 2022-12-14 LAB — LIPID PANEL
Cholesterol: 149 mg/dL (ref <200)
HDL: 59 mg/dL (ref >=50)
LDL Cholesterol (Calc): 68 mg/dL
Non-HDL Cholesterol (Calc): 90 mg/dL (ref <130)
Total CHOL/HDL Ratio: 2.5 (calc) (ref <5.0)
Triglycerides: 140 mg/dL (ref <150)

## 2022-12-14 LAB — IRON,TIBC AND FERRITIN PANEL
%SAT: 7 % — ABNORMAL LOW (ref 16–45)
Ferritin: 8 ng/mL — ABNORMAL LOW (ref 16–288)
Iron: 33 ug/dL — ABNORMAL LOW (ref 45–160)
TIBC: 454 ug/dL — ABNORMAL HIGH (ref 250–450)

## 2022-12-14 LAB — TSH: TSH: 2.34 m[IU]/L (ref 0.40–4.50)

## 2022-12-21 ENCOUNTER — Other Ambulatory Visit: Payer: Self-pay | Admitting: Nurse Practitioner

## 2022-12-21 DIAGNOSIS — F5101 Primary insomnia: Secondary | ICD-10-CM

## 2022-12-25 ENCOUNTER — Encounter (HOSPITAL_BASED_OUTPATIENT_CLINIC_OR_DEPARTMENT_OTHER): Payer: Self-pay

## 2022-12-25 ENCOUNTER — Emergency Department (HOSPITAL_BASED_OUTPATIENT_CLINIC_OR_DEPARTMENT_OTHER)
Admission: EM | Admit: 2022-12-25 | Discharge: 2022-12-25 | Disposition: A | Discharge status: Home / Self Care | Payer: Medicare Other

## 2022-12-25 ENCOUNTER — Other Ambulatory Visit: Payer: Self-pay

## 2022-12-25 DIAGNOSIS — R21 Rash and other nonspecific skin eruption: Secondary | ICD-10-CM | POA: Diagnosis present

## 2022-12-25 DIAGNOSIS — Z7901 Long term (current) use of anticoagulants: Secondary | ICD-10-CM | POA: Insufficient documentation

## 2022-12-25 DIAGNOSIS — L239 Allergic contact dermatitis, unspecified cause: Secondary | ICD-10-CM

## 2022-12-25 MED ORDER — HYDROCORTISONE 1 % EX CREA: TOPICAL_CREAM | CUTANEOUS | 0 refills | Status: AC

## 2022-12-25 NOTE — ED Notes (Signed)
Dc instructions reviewed with patient. Patient voiced understanding. Dc with belongings.  °

## 2022-12-25 NOTE — ED Triage Notes (Signed)
Patient arrives with complaints growing rash to her right leg. Patient states that she was cutting a bush one week ago and rash started after that time.

## 2022-12-25 NOTE — Discharge Instructions (Signed)
Please follow-up with your primary doctor.  You may use the hydrocortisone cream that we have prescribed .  Return to the ED if you develop fevers, chills, redness spreads, begins draining pus, develops severe pain or any new or worsening symptoms that are concerning to you.

## 2022-12-25 NOTE — ED Provider Notes (Signed)
[place] EMERGENCY DEPARTMENT AT [facility] Provider Note   CSN: [id number] Arrival date & time: 12/25/22  1303     History  Chief Complaint  Patient presents with   Rash    [name] [name] is a 77 y.o. female.  This is a 77 year old female presenting emergency department for a rash to her right lower extremity on the anterior shin.  She was doing yard work a week ago, the following day noticed red rash that has been itchy since that time.  She tried nystatin with no improvement.  No fevers no chills.  Does not hurt.   Rash      Home Medications Prior to Admission medications   Medication Sig Start Date End Date Taking? Authorizing Provider  acetaminophen (TYLENOL) 500 MG tablet Take 1,000 mg by mouth every 6 (six) hours as needed for moderate pain.    [provider]  apixaban (ELIQUIS) 5 MG TABS tablet Take 1 tablet (5 mg total) by mouth 2 (two) times daily. 06/17/22   Ronney Asters, NP  buPROPion (WELLBUTRIN XL) 150 MG 24 hr tablet TAKE 1 TABLET BY MOUTH EVERY DAY IN THE MORNING Patient taking differently: Take 150 mg by mouth daily. 10/11/18   Doree Albee, PA-C  Cholecalciferol (VITAMIN D3) 250 MCG (10000 UT) TABS Take 5,000 Units by mouth daily.    [provider]  COLACE 100 MG capsule Take 100 mg by mouth 2 (two) times daily. 04/28/20   [provider]  [facility] 12 HOUR NASAL DECONGESTANT 120 MG 12 hr tablet TAKE 1 TABLET BY MOUTH TWICE DAILY (EVERY 12 HOURS) FOR HEAD/CHEST CONGESTION & EAR FLUID 06/01/21   [name], NP  DULoxetine (CYMBALTA) 30 MG capsule TAKE 1 CAPSULE BY MOUTH 3 TIMES A DAY 10/26/21   [name], NP  furosemide (LASIX) 20 MG tablet Take 1 tablet (20 mg total) by mouth daily. 06/17/22   Ronney Asters, NP  gabapentin (NEURONTIN) 300 MG capsule TAKE 1 CAPSULE BY MOUTH TWICE A DAY FOR CHRONIC PAIN 12/13/22   [name], NP  hydrOXYzine (ATARAX) 50 MG tablet TAKE 1 TO 2 TABLETS 1 TO 2 HOURS BEFORE  BEDTIME AS NEEDED FOR SLEEP 12/21/22   Cranford, Archie Patten, NP  ipratropium (ATROVENT) 0.03 % nasal spray PLACE 2 SPRAYS INTO THE NOSE EVERY MONDAY, TUESDAY, WEDNESDAY, THURSDAY, AND FRIDAY. 01/26/22 01/26/23  [name], MD  ketoconazole (NIZORAL) 2 % shampoo USE AS DIRECTED TWICE A WEEK 10/29/20   [name], NP  levothyroxine (SYNTHROID) 50 MCG tablet Take  1 tablet  Daily  on an empty stomach with only water for 30 minutes & no Antacid meds, Calcium or Magnesium for 4 hours & avoid Biotin                                                                   /                                             TAKE  BY                                                        MOUTH 09/04/22   [name], MD  losartan (COZAAR) 50 MG tablet Take 1 tablet (50 mg total) by mouth daily. 06/17/22   Ronney Asters, NP  meclizine (ANTIVERT) 25 MG tablet 1/2-1 pill up to 3 times daily for motion sickness/dizziness. Caution, may cause drowsiness. Patient taking differently: Take 25 mg by mouth 3 (three) times daily as needed for dizziness. 05/24/19   Judd Gaudier, NP  metoprolol succinate (TOPROL XL) 25 MG 24 hr tablet Take 1 tablet (25 mg total) by mouth daily. 06/17/22   Ronney Asters, NP  mometasone (NASONEX) 50 MCG/ACT nasal spray Use 1-2 sprays  to each nostril 1 to 2 x /day Patient taking differently: Place 1-2 sprays into the nose 2 (two) times daily as needed (allergies). 06/22/19   [name], MD  nystatin cream (MYCOSTATIN) APPLY TO AFFECTED AREA TWICE A DAY 09/04/22   [name], MD  omeprazole (PRILOSEC) 40 MG capsule TAKE 1 CAPSULE 2 X /DAY TO PREVENT HEARTBURN & INDIGESTION 09/04/22   [name], MD  ondansetron [facility]) 8 MG tablet Take 1/2 to 1 3 x /day before meals as needed for Nausea 10/17/22   [name], MD  potassium chloride SA (KLOR-CON M20) 20 MEQ tablet Take 1 tablet (20 mEq total) by mouth daily. 06/17/22    Ronney Asters, NP  rosuvastatin (CRESTOR) 40 MG tablet Take 1 tablet (40 mg total) by mouth daily. 06/17/22   Ronney Asters, NP  Semaglutide, 2 MG/DOSE, (OZEMPIC, 2 MG/DOSE,) 8 MG/3ML SOPN Inject 2 mg into Skin every 7 days for Diabetes  (Dx: e11.29 ) 09/04/22   [name], MD  traMADol Janean Sark) 50 MG tablet Take 1 tablet every 4 hours as needed for severe pain 12/13/22   [name], NP      Allergies    Requip [ropinirole hcl], Minocycline, Tetracyclines & related, Zestril [lisinopril], Zetia [ezetimibe], Codeine, Morphine sulfate, Spironolactone, and Sulfa antibiotics    Review of Systems   Review of Systems  Skin:  Positive for rash.    Physical Exam Updated Vital Signs BP 111/66   Pulse 84   Temp 98.5 F (36.9 C) (Oral)   Resp 20   Ht 5\' 5"  (1.651 m)   Wt 94.8 kg   SpO2 97%   BMI 34.78 kg/m  Physical Exam Vitals and nursing note reviewed.  Constitutional:      General: She is not in acute distress.    Appearance: She is not toxic-appearing.  HENT:     Head: Normocephalic.     Mouth/Throat:     Mouth: Mucous membranes are moist.  Eyes:     Conjunctiva/sclera: Conjunctivae normal.  Cardiovascular:     Rate and Rhythm: Normal rate.  Musculoskeletal:        General: Normal range of motion.  Skin:    General: Skin is warm.     Comments: Patient has a 2 by 10 cm salmon-colored patch on her anterior right shin there are some small vesicles.  Slight area of central clearing  Neurological:     Mental Status: She is alert and oriented to person, place, and time.  Psychiatric:  Mood and Affect: Mood normal.        Behavior: Behavior normal.     ED Results / Procedures / Treatments   Labs (all labs ordered are listed, but only abnormal results are displayed) Labs Reviewed - No data to display  EKG None  Radiology No results found.  Procedures Procedures    Medications Ordered in ED Medications - No data to display  ED Course/ Medical  Decision Making/ A&P                                 Medical Decision Making Well-appearing 77 year old female presenting emergency department for rash to her right shin.  Afebrile vital signs reassuring.  Physical exam consistent with contact dermatitis.  Rash not consistent with cellulitis she showed picture did not seemingly improving.  It is quite itchy; will discharge with hydrocortisone.  Follow-up with primary doctor.          Final Clinical Impression(s) / ED Diagnoses Final diagnoses:  None    Rx / DC Orders ED Discharge Orders     None         Coral Spikes, DO 12/25/22 1420

## 2023-01-31 ENCOUNTER — Other Ambulatory Visit: Payer: Self-pay | Admitting: Internal Medicine

## 2023-02-15 ENCOUNTER — Other Ambulatory Visit: Payer: Self-pay | Admitting: Internal Medicine

## 2023-02-21 ENCOUNTER — Other Ambulatory Visit: Payer: Self-pay | Admitting: Internal Medicine

## 2023-03-02 ENCOUNTER — Encounter: Payer: Medicare Other | Admitting: Nurse Practitioner

## 2023-03-14 ENCOUNTER — Other Ambulatory Visit: Payer: Self-pay | Admitting: Nurse Practitioner

## 2023-03-20 ENCOUNTER — Encounter: Payer: Self-pay | Admitting: Nurse Practitioner

## 2023-03-20 ENCOUNTER — Ambulatory Visit (INDEPENDENT_AMBULATORY_CARE_PROVIDER_SITE_OTHER): Payer: Medicare Other | Admitting: Nurse Practitioner

## 2023-03-20 VITALS — BP 118/62 | HR 78 | Temp 97.3°F | Ht 65.0 in | Wt 204.0 lb

## 2023-03-20 DIAGNOSIS — E039 Hypothyroidism, unspecified: Secondary | ICD-10-CM

## 2023-03-20 DIAGNOSIS — F3341 Major depressive disorder, recurrent, in partial remission: Secondary | ICD-10-CM

## 2023-03-20 DIAGNOSIS — E559 Vitamin D deficiency, unspecified: Secondary | ICD-10-CM

## 2023-03-20 DIAGNOSIS — M79602 Pain in left arm: Secondary | ICD-10-CM

## 2023-03-20 DIAGNOSIS — Z853 Personal history of malignant neoplasm of breast: Secondary | ICD-10-CM

## 2023-03-20 DIAGNOSIS — E782 Mixed hyperlipidemia: Secondary | ICD-10-CM

## 2023-03-20 DIAGNOSIS — I480 Paroxysmal atrial fibrillation: Secondary | ICD-10-CM

## 2023-03-20 DIAGNOSIS — Z0000 Encounter for general adult medical examination without abnormal findings: Secondary | ICD-10-CM

## 2023-03-20 DIAGNOSIS — I5042 Chronic combined systolic (congestive) and diastolic (congestive) heart failure: Secondary | ICD-10-CM

## 2023-03-20 DIAGNOSIS — I471 Supraventricular tachycardia, unspecified: Secondary | ICD-10-CM

## 2023-03-20 DIAGNOSIS — Z0001 Encounter for general adult medical examination with abnormal findings: Secondary | ICD-10-CM

## 2023-03-20 DIAGNOSIS — I10 Essential (primary) hypertension: Secondary | ICD-10-CM

## 2023-03-20 DIAGNOSIS — D508 Other iron deficiency anemias: Secondary | ICD-10-CM

## 2023-03-20 DIAGNOSIS — N1831 Chronic kidney disease, stage 3a: Secondary | ICD-10-CM

## 2023-03-20 DIAGNOSIS — R7309 Other abnormal glucose: Secondary | ICD-10-CM

## 2023-03-20 DIAGNOSIS — S60415A Abrasion of left ring finger, initial encounter: Secondary | ICD-10-CM

## 2023-03-20 DIAGNOSIS — Z79899 Other long term (current) drug therapy: Secondary | ICD-10-CM

## 2023-03-20 DIAGNOSIS — Z1389 Encounter for screening for other disorder: Secondary | ICD-10-CM

## 2023-03-20 DIAGNOSIS — I428 Other cardiomyopathies: Secondary | ICD-10-CM

## 2023-03-20 DIAGNOSIS — R112 Nausea with vomiting, unspecified: Secondary | ICD-10-CM

## 2023-03-20 MED ORDER — CYCLOBENZAPRINE HCL 5 MG PO TABS: 5.0000 mg | ORAL_TABLET | Freq: Three times a day (TID) | ORAL | 0 refills | Status: AC | PRN

## 2023-03-20 MED ORDER — ONDANSETRON HCL 8 MG PO TABS: ORAL_TABLET | ORAL | 0 refills | Status: AC

## 2023-03-20 NOTE — Patient Instructions (Signed)

## 2023-03-20 NOTE — Progress Notes (Signed)
COMPLETE PHYSICAL  Assessment / Plan:  Encounter for general adult medical examination with abnormal findings Due yearly  Essential hypertension - continue medications: Losartan 50 mg and metoprolol 25 mg Qd.  She also takes Lasix 20 mg BID and Klor-Con 20 meq qd - Continue DASH diet, exercise and monitor at home. Call if greater than 130/80.   -     CBC with Differential/Platelet -     CMP/GFR -     TSH  Paroxysmal atrial fibrillation [facility]): CHA2DS2-VASc Score - starting Eliquis Control blood pressure, cholesterol, glucose, increase exercise.  Continue cardio follow up  Chronic combined systolic and diastolic heart failure, NYHA class 2 (HCC) -- exacerbated by A. Fib Weight stable, appears euvoluemic, followed by cardiology Defer EKG- followed by cardiology who performs  Non-ischemic cardiomyopathy (HCC) Weight stable at home Optimize meds  Paroxysmal supraventricular tachycardia (HCC) Control blood pressure, cholesterol, glucose, increase exercise.  Continue cardio follow up  Non-seasonal allergic rhinitis, unspecified trigger - Allegra OTC, increase H20, allergy hygiene explained.  Gastroesophageal reflux disease without esophagitis Continue PPI/H2 blocker, diet discussed Magnesium  Other specified hypothyroidism Hypothyroidism-check TSH level, continue medications the same, reminded to take on an empty stomach 30-9mins before food.  -     TSH  Lumbar radiculopathy Follow up ortho/pain management   DDD (degenerative disc disease), lumbar Follow up ortho  Hyperlipidemia -continue medications, check lipids, decrease fatty foods, increase activity.  -     Lipid panel  H/O left bundle branch block Continue cardio follow up  ANXIETY DEPRESSION Continue meds  History of pancreatitis Monitor, no ETOH  Hx of  -  Malignant neoplasm of lower-inner quadrant of left breast in female, estrogen receptor positive  S/p lumpectomy and 5 year anastrozole, has been  released Monitoring by annual mammograms   Morbid Obesity with co morbidities (HCC)  - long discussion about weight loss, diet, and exercise   Abnormal Glucose Continue Ozempic- use ondansetron for nausea as needed Discussed general issues about diabetes pathophysiology and management., Educational material distributed., Suggested low cholesterol diet., Encouraged aerobic exercise., Discussed foot care., Reminded to get yearly retinal exam. -     Hemoglobin A1c  Vitamin D deficiency At goal at recent check; continue to recommend supplementation for goal of 60-100 Check Vitamin D level  History of ulcer disease Monitor, avoid NSAIDS, continue GERD meds  CKD Stage 2 GFR 60-89 Increase fluids, avoid NSAIDS, monitor sugars, will monitor Routine urine with microscopic reflex Microalbumin/ creatinine urine ratio  Anemia Chronic, stable, monitor. UTD on colonoscopy. - CBC - Iron/ferritin panel  Screening for hematuria/proteinuria - Urine with microscopic and reflex culture - Microalbumin/creatinine urine ratio   Medication management -     CBC with Differential/Platelet -     COMPLETE METABOLIC PANEL WITH GFR -     Magnesium -     Lipid panel -     Hemoglobin A1C w/out eAG -     TSH -     VITAMIN D 25 Hydroxy (Vit-D Deficiency, Fractures) -     Urinalysis, Routine w reflex microscopic -     Microalbumin / creatinine urine ratio  Screening for hematuria or proteinuria -     Urinalysis, Routine w reflex microscopic -     Microalbumin / creatinine urine ratio  Nausea and vomiting, unspecified vomiting type -     ondansetron (ZOFRAN) 8 MG tablet; Take 1/2 to 1 3 x /day before meals as needed for Nausea  Pain of left upper extremity Use tylenol,  heat and rest Flexeril as needed -     cyclobenzaprine (FLEXERIL) 5 MG tablet; Take 1 tablet (5 mg total) by mouth 3 (three) times daily as needed for muscle spasms.  Abrasion of left ring finger, initial encounter Keep  appointment with [facility] tomorrow      Further disposition pending results if labs check today. Discussed med's effects and SE's.   Over 40 minutes of face to face interview, exam, counseling, chart review, and critical decision making was performed.   Future Appointments  Date Time Provider Department Center  06/21/2023  2:30 PM [name], NP [facility] None  03/19/2024  2:00 PM [name], NP [facility] None      Subjective:  [name] [name] is a 77 y.o. female who presents for complete physical. has Hyperlipidemia, mixed; Major depressive disorder, recurrent episode, in partial remission (HCC); Essential hypertension; H/O left bundle branch block; Allergic rhinitis; GERD; Abnormal glucose; Vitamin D deficiency; DDD (degenerative disc disease), lumbar; Paroxysmal atrial fibrillation (HCC): CHA2DS2-VASc Score  4 ->  Eliquis; Hypothyroidism; Severe anemia; (HFpEF) heart failure with preserved ejection fraction (HCC); Non-ischemic cardiomyopathy (HCC); Primary osteoarthritis of left knee; Severe obesity with body mass index (BMI) of 35.0 to 39.9 with comorbidity (HCC); CKD (chronic kidney disease) stage 3, GFR 30-59 ml/min (HCC); Bradycardia; History of COVID-19; 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  She has a lot of fatigue after work daily.   She fell Friday night, walking her dogs.  Fell up steps and has bruise on her right knee and left arm. Has appt with [facility] to look at ring finger on left hand. Her whole body aches. She has been using some Tylenol.   She has hx of R breast cancer in 2015, s/p lumpectomy, completed 5 years of anastrozole and was released by Dr. Darnelle Catalan, now following with annual mammograms. Last mammogram 07/19/22- negative  She previously followed with Dr. Rocco Serene management) for cervical and lower back pain with radiculopathy and  previously got epidural injections but did not relieve pain. She also has right knee pain seen by Dr. Luiz Blare. [facility]. Had done PT previously which did help somewhat.  Pain is the worst when she uses her left arm.   DEXA done 11/12/21 T score -2.0 osteopenia  she has a diagnosis of depression/anxiety and is currently on wellbutrin 150 mg, cymbalta 60 mg, reports symptoms are typically well controlled with this regimen.she also takes hydroxyzine for insomnia.  she has a diagnosis of GERD with history of ulcer, which is currently managed by omeprazole 40 mg and famotidine PRN breakthrough.   She had cervical fusion 09/24/20 with Dr. Yevette Edwards and since that time has been experiencing dizziness. Currently dizziness has been controlled. Occurs intermittently and Meclizine does help.   BMI is Body mass index is 33.95 kg/m., she has not been working on diet and exercise, works all day on her feet with walking and lifting at [employer]. She is up 15 pounds in the past 3 months.  Wt Readings from Last 3 Encounters:  03/20/23 204 lb (92.5 kg)  12/25/22 209 lb (94.8 kg)  12/13/22 219 lb 3.2 oz (99.4 kg)   She has hx of a. Fib/PSVT, on elequis, non-ischemic cardiomyopathy, diastolic CHF improved on ARB/BB, most recent ECHO with EF 60-65%, followed by cardiology Dr. Herbie Baltimore.   Had recent benign holter results in Feb 2021.   Her blood  pressure has been controlled at home on Losartan 50 mg and metoprolol 25 mg Qd.  She also takes Lasix 20 mg BID and Klor-Con 20 meq qd, today their BP is BP: 118/62 BP Readings from Last 3 Encounters:  03/20/23 118/62  12/25/22 111/66  12/13/22 128/64    She does not workout. She denies chest pain, shortness of breath, dizziness.   She is on cholesterol medication (rosuvastatin 40 mg daily) and denies myalgias. Her cholesterol is at goal. She is taking CoQ10 to help with myalgias from statin and it has improved that symptoms. The cholesterol last visit was:    Lab Results  Component Value Date   CHOL 149 12/13/2022   HDL 59 12/13/2022   LDLCALC 68 12/13/2022   TRIG 140 12/13/2022   CHOLHDL 2.5 12/13/2022    She has not been working on diet and exercise for prediabetes, and denies foot ulcerations, increased appetite, nausea, paresthesia of the feet, polydipsia, polyuria, visual disturbances, vomiting and weight loss.She is currently on Ozempic 2 mg SQ QW.  Last A1C in the office was:  Lab Results  Component Value Date   HGBA1C 6.0 (H) 09/06/2022   She has CKD III monitored at this office. She is drinking water and has been trying to get at least 32 ounces and sometimes 48 ounces.  Last GFR:  Lab Results  Component Value Date   EGFR 46 (L) 12/13/2022    Patient is on Vitamin D supplement.   Lab Results  Component Value Date   VD25OH 48 09/06/2022       Medication Review: Current Outpatient Medications on File Prior to Visit  Medication Sig Dispense Refill   acetaminophen (TYLENOL) 500 MG tablet Take 1,000 mg by mouth every 6 (six) hours as needed for moderate pain.     apixaban (ELIQUIS) 5 MG TABS tablet Take 1 tablet (5 mg total) by mouth 2 (two) times daily. 180 tablet 3   buPROPion (WELLBUTRIN XL) 150 MG 24 hr tablet TAKE 1 TABLET BY MOUTH EVERY DAY IN THE MORNING (Patient taking differently: Take 150 mg by mouth daily.) 90 tablet 1   Cholecalciferol (VITAMIN D3) 250 MCG (10000 UT) TABS Take 5,000 Units by mouth daily.     Coenzyme Q10 (COQ10 PO) Take by mouth.     COLACE 100 MG capsule Take 100 mg by mouth 2 (two) times daily.     [employer] 12 HOUR NASAL DECONGESTANT 120 MG 12 hr tablet TAKE 1 TABLET BY MOUTH TWICE DAILY (EVERY 12 HOURS) FOR HEAD/CHEST CONGESTION & EAR FLUID 60 tablet 3   DULoxetine (CYMBALTA) 30 MG capsule TAKE 1 CAPSULE BY MOUTH 3 TIMES A DAY 270 capsule 3   furosemide (LASIX) 20 MG tablet Take 1 tablet (20 mg total) by mouth daily. 90 tablet 3   gabapentin (NEURONTIN) 300 MG capsule TAKE 1 CAPSULE BY MOUTH TWICE A  DAY FOR CHRONIC PAIN 180 capsule 1   hydrocortisone cream 1 % Apply to affected area 2 times daily 15 g 0   hydrOXYzine (ATARAX) 50 MG tablet TAKE 1 TO 2 TABLETS 1 TO 2 HOURS BEFORE BEDTIME AS NEEDED FOR SLEEP 180 tablet 2   ipratropium (ATROVENT) 0.03 % nasal spray PLACE 2 SPRAYS INTO THE NOSE EVERY MONDAY, TUESDAY, WEDNESDAY, THURSDAY, AND FRIDAY. 30 mL 1   ketoconazole (NIZORAL) 2 % shampoo USE AS DIRECTED TWICE A WEEK 120 mL 0   levothyroxine (SYNTHROID) 50 MCG tablet 1 tab daily Please take your thyroid medication greater than 30  min before breakfast, separated by at least 4 hours  from antacids, calcium, iron, and multivitamins. 90 tablet 3   losartan (COZAAR) 50 MG tablet Take 1 tablet (50 mg total) by mouth daily. 90 tablet 3   meclizine (ANTIVERT) 25 MG tablet 1/2-1 pill up to 3 times daily for motion sickness/dizziness. Caution, may cause drowsiness. (Patient taking differently: Take 25 mg by mouth 3 (three) times daily as needed for dizziness.) 60 tablet 0   metoprolol succinate (TOPROL XL) 25 MG 24 hr tablet Take 1 tablet (25 mg total) by mouth daily. 90 tablet 3   nystatin cream (MYCOSTATIN) APPLY TO AFFECTED AREA TWICE A DAY 90 g 3   omeprazole (PRILOSEC) 40 MG capsule TAKE 1 CAPSULE 2 X /DAY TO PREVENT HEARTBURN & INDIGESTION 180 capsule 3   ondansetron (ZOFRAN) 8 MG tablet Take 1/2 to 1 3 x /day before meals as needed for Nausea 90 tablet 0   potassium chloride SA (KLOR-CON M20) 20 MEQ tablet Take 1 tablet (20 mEq total) by mouth daily. 90 tablet 3   rosuvastatin (CRESTOR) 40 MG tablet TAKE 1 TABLET BY MOUTH EVERY DAY EXCEPT TAKE 2 TABLETS BY MOUTH ON MONDAYS 102 tablet 2   Semaglutide, 2 MG/DOSE, (OZEMPIC, 2 MG/DOSE,) 8 MG/3ML SOPN Inject 2 mg into Skin every 7 days for Diabetes  (Dx: e11.29 ) 9 mL 1   traMADol (ULTRAM) 50 MG tablet Take 1 tablet every 4 hours as needed for severe pain 30 tablet 0   mometasone (NASONEX) 50 MCG/ACT nasal spray Use 1-2 sprays  to each nostril 1 to 2 x  /day (Patient not taking: Reported on 03/20/2023) 51 g 3   Current Facility-Administered Medications on File Prior to Visit  Medication Dose Route Frequency Provider Last Rate Last Admin   ipratropium-albuterol (DUONEB) 0.5-2.5 (3) MG/3ML nebulizer solution 3 mL  3 mL Nebulization Once Cranford, Tonya, NP        Allergies  Allergen Reactions   Requip [Ropinirole Hcl] Shortness Of Breath and Nausea And Vomiting   Minocycline Hives   Tetracyclines & Related Hives   Zestril [Lisinopril] Cough   Zetia [Ezetimibe] Other (See Comments)    MYALGIA JOINT PAIN   Codeine Other (See Comments)    fatigue   Morphine Sulfate Palpitations   Spironolactone Rash   Sulfa Antibiotics Other (See Comments)    Headache (pt states that a blood pressure medicine caused a severe headache but doesn't remember a reaction to sulfa)    Current Problems (verified) Patient Active Problem List   Diagnosis Date Noted   History of breast cancer 09/06/2019   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 04/30/2019   Bradycardia    History of COVID-19    CKD (chronic kidney disease) stage 3, GFR 30-59 ml/min (HCC) 09/21/2018   Severe obesity with body mass index (BMI) of 35.0 to 39.9 with comorbidity (HCC) 06/14/2017   Primary osteoarthritis of left knee 09/23/2016   Non-ischemic cardiomyopathy (HCC)    (HFpEF) heart failure with preserved ejection fraction (HCC) 08/08/2015   Paroxysmal atrial fibrillation (HCC): CHA2DS2-VASc Score  4 ->  Eliquis 08/06/2015   Hypothyroidism 08/06/2015   Severe anemia    DDD (degenerative disc disease), lumbar 06/16/2014   Abnormal glucose 12/18/2013   Vitamin D deficiency 12/18/2013   Hyperlipidemia, mixed 03/13/2008   Major depressive disorder, recurrent episode, in partial remission (HCC) 03/13/2008   Essential hypertension 03/13/2008   H/O left bundle branch block 03/13/2008   Allergic rhinitis 03/13/2008   GERD 03/13/2008  Screening Tests Health Maintenance  Topic Date Due    COVID-19 Vaccine (6 - 2023-24 season) 02/13/2023   Medicare Annual Wellness (AWV)  09/04/2023   DTaP/Tdap/Td (3 - Td or Tdap) 09/23/2028   Pneumonia Vaccine 30+ Years old  Completed   INFLUENZA VACCINE  Completed   DEXA SCAN  Completed   Hepatitis C Screening  Completed   Zoster Vaccines- Shingrix  Completed   HPV VACCINES  Aged Out   Colonoscopy  Discontinued    Immunization History  Administered Date(s) Administered   Fluad Quad(high Dose 65+) 01/15/2020   Fluad Trivalent(High Dose 65+) 12/19/2022   Influenza Whole 01/01/2013   Influenza, High Dose Seasonal PF 01/29/2014, 12/30/2014, 01/17/2019, 01/14/2021, 02/11/2022   Influenza,inj,Quad PF,6+ Mos 01/18/2018   Influenza-Unspecified 12/11/2015, 02/04/2017, 01/18/2018   Moderna Covid-19 Fall Seasonal Vaccine 40yrs & older 12/19/2022   PFIZER(Purple Top)SARS-COV-2 Vaccination 05/18/2019, 06/10/2019, 01/31/2020   PNEUMOCOCCAL CONJUGATE-20 02/25/2022   Pfizer Covid-19 Vaccine Bivalent Booster 33yrs & up 04/28/2021   Pneumococcal Conjugate-13 06/16/2014   Pneumococcal Polysaccharide-23 05/23/2011   Td 09/24/2018   Tdap 03/11/2008   Zoster Recombinant(Shingrix) 02/25/2022, 12/19/2022   Zoster, Live 02/02/2010      Names of Other Physician/Practitioners you currently use: 1. [facility] here for primary care 2. Dr Lorin Picket, eye doctor, last visit 2024 3. [facility], dentist, last visit 2024, q 6 months  Patient Care Team: [name], MD as PCP - General (Internal Medicine) Marykay Lex, MD as PCP - Cardiology (Cardiology) Magrinat, Valentino Hue, MD (Inactive) as Consulting Physician (Oncology) Marykay Lex, MD as Consulting Physician (Cardiology) Jodi Geralds, MD as Consulting Physician (Orthopedic Surgery) Emelia Loron, MD as Consulting Physician (General Surgery) Lonie Peak, MD as Attending Physician (Radiation Oncology)  SURGICAL HISTORY She  has a  past surgical history that includes Cataract extraction bilateral w/ anterior vitrectomy (Bilateral); Abdominal hysterectomy; Cholecystectomy; Knee arthroscopy (Bilateral); Tonsillectomy; Colonoscopy (2014); Upper gi endoscopy; Dupuytren / palmar fasciotomy (Bilateral, 2007); Tubal ligation; epidural injections; Lumbar fusion (10/2013); Cardiac catheterization (N/A, 08/10/2015); transthoracic echocardiogram (08/07/2015); transthoracic echocardiogram (10/2015); Joint replacement; Total knee arthroplasty (Left, 09/23/2016); Breast lumpectomy (Left); Foot Tendon Surgery (Right, ~ 11/2015); Total knee arthroplasty (Left, 09/23/2016); transthoracic echocardiogram (04/05/2019); Back surgery; Anterior cervical decomp/discectomy fusion (N/A, 09/24/2020); and Breast lumpectomy (Left). FAMILY HISTORY Her family history includes Breast cancer in her sister; COPD in her father; Heart attack in her mother; Hyperlipidemia in her sister; Hypertension in her father; Stroke in her mother. SOCIAL HISTORY She  reports that she has never smoked. She has never used smokeless tobacco. She reports current alcohol use. She reports that she does not use drugs.      Review of Systems  Constitutional:  Negative for malaise/fatigue and weight loss.  HENT:  Negative for hearing loss and tinnitus.   Eyes:  Negative for blurred vision and double vision.  Respiratory:  Negative for cough, sputum production, shortness of breath and wheezing.   Cardiovascular:  Negative for chest pain, palpitations, orthopnea, claudication, leg swelling and PND.  Gastrointestinal:  Positive for constipation (controlled with stool softener). Negative for abdominal pain, blood in stool, diarrhea, heartburn, melena, nausea and vomiting.  Genitourinary: Negative.  Negative for dysuria.  Musculoskeletal:  Positive for back pain, joint pain (right knee, left arm, left ring finger) and neck pain. Negative for falls and myalgias.  Skin:  Negative for rash.   Neurological:  Positive for dizziness. Negative for tingling, sensory change, weakness and headaches.  Endo/Heme/Allergies:  Negative for  polydipsia.  Psychiatric/Behavioral: Negative.  Negative for depression, memory loss, substance abuse and suicidal ideas. The patient is not nervous/anxious and does not have insomnia.   All other systems reviewed and are negative.    Objective:     Today's Vitals   03/20/23 1400  BP: 118/62  Pulse: 78  Temp: (!) 97.3 F (36.3 C)  SpO2: 97%  Weight: 204 lb (92.5 kg)  Height: 5\' 5"  (1.651 m)    Body mass index is 33.95 kg/m.  General appearance: alert, no distress, WD/WN, female HEENT: normocephalic, sclerae anicteric, TMs pearly, nares patent, no discharge or erythema, pharynx normal Oral cavity: MMM, no lesions Neck: supple, no lymphadenopathy, no thyromegaly, no masses Heart: RRR, normal S1, S2, no murmurs  Lungs: CTA bilaterally, no wheezes, rhonchi, or rales Abdomen: +bs, soft, non tender, non distended, no masses, no hepatomegaly, no splenomegaly Musculoskeletal: nontender, right knee slightly swollen and tender to palpation from ecchymosis. Left arm good ROM but some tenderness on back of arm. Left ring finger wrapped in bandage Extremities: no edema, no cyanosis, no clubbing Pulses: 2+ symmetric, upper and lower extremities, normal cap refill Neurological: alert, oriented x 3, CN2-12 intact, strength normal upper extremities and lower extremities, sensation normal throughout, DTRs 2+ throughout, no cerebellar signs, gait normal Psychiatric: normal affect, behavior normal, pleasant    EKG: deferred to cardiology    [facility]  03/20/2023

## 2023-03-21 LAB — URINALYSIS, ROUTINE W REFLEX MICROSCOPIC
Bilirubin Urine: NEGATIVE
Glucose, UA: NEGATIVE
Hgb urine dipstick: NEGATIVE
Ketones, ur: NEGATIVE
Leukocytes,Ua: NEGATIVE
Nitrite: NEGATIVE
Protein, ur: NEGATIVE
Specific Gravity, Urine: 1.004 (ref 1.001–1.035)
pH: 6 (ref 5.0–8.0)

## 2023-03-21 LAB — CBC WITH DIFFERENTIAL/PLATELET
Absolute Lymphocytes: 1823 {cells}/uL (ref 850–3900)
Absolute Monocytes: 698 {cells}/uL (ref 200–950)
Basophils Absolute: 113 {cells}/uL (ref 0–200)
Basophils Relative: 1.5 %
Eosinophils Absolute: 210 {cells}/uL (ref 15–500)
Eosinophils Relative: 2.8 %
HCT: 35.6 % (ref 35.0–45.0)
Hemoglobin: 11.5 g/dL — ABNORMAL LOW (ref 11.7–15.5)
MCH: 28.5 pg (ref 27.0–33.0)
MCHC: 32.3 g/dL (ref 32.0–36.0)
MCV: 88.1 fL (ref 80.0–100.0)
MPV: 11.2 fL (ref 7.5–12.5)
Monocytes Relative: 9.3 %
Neutro Abs: 4658 {cells}/uL (ref 1500–7800)
Neutrophils Relative %: 62.1 %
Platelets: 279 10*3/uL (ref 140–400)
RBC: 4.04 10*6/uL (ref 3.80–5.10)
RDW: 14.1 % (ref 11.0–15.0)
Total Lymphocyte: 24.3 %
WBC: 7.5 10*3/uL (ref 3.8–10.8)

## 2023-03-21 LAB — COMPLETE METABOLIC PANEL WITH GFR
AG Ratio: 1.7 (calc) (ref 1.0–2.5)
ALT: 11 U/L (ref 6–29)
AST: 16 U/L (ref 10–35)
Albumin: 4.5 g/dL (ref 3.6–5.1)
Alkaline phosphatase (APISO): 80 U/L (ref 37–153)
BUN/Creatinine Ratio: 14 (calc) (ref 6–22)
BUN: 16 mg/dL (ref 7–25)
CO2: 27 mmol/L (ref 20–32)
Calcium: 9.5 mg/dL (ref 8.6–10.4)
Chloride: 101 mmol/L (ref 98–110)
Creat: 1.16 mg/dL — ABNORMAL HIGH (ref 0.60–1.00)
Globulin: 2.6 g/dL (ref 1.9–3.7)
Glucose, Bld: 76 mg/dL (ref 65–99)
Potassium: 3.7 mmol/L (ref 3.5–5.3)
Sodium: 138 mmol/L (ref 135–146)
Total Bilirubin: 0.3 mg/dL (ref 0.2–1.2)
Total Protein: 7.1 g/dL (ref 6.1–8.1)
eGFR: 49 mL/min/{1.73_m2} — ABNORMAL LOW (ref >=60)

## 2023-03-21 LAB — LIPID PANEL
Cholesterol: 139 mg/dL (ref <200)
HDL: 58 mg/dL (ref >=50)
LDL Cholesterol (Calc): 60 mg/dL
Non-HDL Cholesterol (Calc): 81 mg/dL (ref <130)
Total CHOL/HDL Ratio: 2.4 (calc) (ref <5.0)
Triglycerides: 124 mg/dL (ref <150)

## 2023-03-21 LAB — MAGNESIUM: Magnesium: 2 mg/dL (ref 1.5–2.5)

## 2023-03-21 LAB — IRON,TIBC AND FERRITIN PANEL
%SAT: 10 % — ABNORMAL LOW (ref 16–45)
Ferritin: 15 ng/mL — ABNORMAL LOW (ref 16–288)
Iron: 42 ug/dL — ABNORMAL LOW (ref 45–160)
TIBC: 424 ug/dL (ref 250–450)

## 2023-03-21 LAB — MICROALBUMIN / CREATININE URINE RATIO
Creatinine, Urine: 21 mg/dL (ref 20–275)
Microalb Creat Ratio: 14 mg/g{creat} (ref <30)
Microalb, Ur: 0.3 mg/dL

## 2023-03-21 LAB — HEMOGLOBIN A1C W/OUT EAG: Hgb A1c MFr Bld: 6.1 %{Hb} — ABNORMAL HIGH (ref <5.7)

## 2023-03-21 LAB — TSH: TSH: 2.13 m[IU]/L (ref 0.40–4.50)

## 2023-03-21 LAB — VITAMIN D 25 HYDROXY (VIT D DEFICIENCY, FRACTURES): Vit D, 25-Hydroxy: 40 ng/mL (ref 30–100)

## 2023-05-23 ENCOUNTER — Other Ambulatory Visit: Payer: Self-pay

## 2023-05-23 MED ORDER — IPRATROPIUM BROMIDE 0.03 % NA SOLN: 2.0000 | NASAL | 0 refills | Status: AC

## 2023-06-19 ENCOUNTER — Other Ambulatory Visit: Payer: Self-pay

## 2023-06-19 ENCOUNTER — Other Ambulatory Visit: Payer: Self-pay | Admitting: General Practice

## 2023-06-19 ENCOUNTER — Other Ambulatory Visit: Payer: Self-pay | Admitting: Internal Medicine

## 2023-06-19 DIAGNOSIS — Z1231 Encounter for screening mammogram for malignant neoplasm of breast: Secondary | ICD-10-CM

## 2023-06-19 MED ORDER — FUROSEMIDE 20 MG PO TABS: 20.0000 mg | ORAL_TABLET | Freq: Every day | ORAL | 0 refills | Status: DC

## 2023-06-21 ENCOUNTER — Ambulatory Visit: Payer: Medicare Other | Admitting: Nurse Practitioner

## 2023-07-10 ENCOUNTER — Encounter (HOSPITAL_BASED_OUTPATIENT_CLINIC_OR_DEPARTMENT_OTHER): Payer: Self-pay

## 2023-07-11 ENCOUNTER — Ambulatory Visit: Payer: Medicare Other | Attending: Cardiology | Admitting: Cardiology

## 2023-07-11 VITALS — BP 112/62 | HR 92 | Ht 65.5 in | Wt 203.0 lb

## 2023-07-11 DIAGNOSIS — I5032 Chronic diastolic (congestive) heart failure: Secondary | ICD-10-CM | POA: Diagnosis not present

## 2023-07-11 DIAGNOSIS — I493 Ventricular premature depolarization: Secondary | ICD-10-CM

## 2023-07-11 DIAGNOSIS — I480 Paroxysmal atrial fibrillation: Secondary | ICD-10-CM

## 2023-07-11 DIAGNOSIS — I428 Other cardiomyopathies: Secondary | ICD-10-CM | POA: Diagnosis not present

## 2023-07-11 DIAGNOSIS — D6869 Other thrombophilia: Secondary | ICD-10-CM

## 2023-07-11 DIAGNOSIS — I10 Essential (primary) hypertension: Secondary | ICD-10-CM | POA: Diagnosis not present

## 2023-07-11 DIAGNOSIS — E782 Mixed hyperlipidemia: Secondary | ICD-10-CM

## 2023-07-11 DIAGNOSIS — I447 Left bundle-branch block, unspecified: Secondary | ICD-10-CM | POA: Diagnosis not present

## 2023-07-11 DIAGNOSIS — Z8679 Personal history of other diseases of the circulatory system: Secondary | ICD-10-CM

## 2023-07-11 MED ORDER — FUROSEMIDE 20 MG PO TABS: 20.0000 mg | ORAL_TABLET | Freq: Every day | ORAL | 3 refills | Status: AC

## 2023-07-11 MED ORDER — ROSUVASTATIN CALCIUM 40 MG PO TABS: ORAL_TABLET | ORAL | 2 refills | Status: DC

## 2023-07-11 MED ORDER — POTASSIUM CHLORIDE CRYS ER 20 MEQ PO TBCR: 20.0000 meq | EXTENDED_RELEASE_TABLET | Freq: Every day | ORAL | 3 refills | Status: DC

## 2023-07-11 MED ORDER — APIXABAN 5 MG PO TABS
5.0000 mg | ORAL_TABLET | Freq: Two times a day (BID) | ORAL | 2 refills | Status: AC
Start: 2023-07-11 — End: ?

## 2023-07-11 NOTE — Patient Instructions (Signed)
 Medication Instructions:  No changes  *If you need a refill on your cardiac medications before your next appointment, please call your pharmacy*   Lab Work: Not needed If you have labs (blood work) drawn today and your tests are completely normal, you will receive your results only by: MyChart Message (if you have MyChart) OR A paper copy in the mail If you have any lab test that is abnormal or we need to change your treatment, we will call you to review the results.   Testing/Procedures: Not needed   Follow-Up: At [facility], you and your health needs are our priority.  As part of our continuing mission to provide you with exceptional heart care, we have created designated Provider Care Teams.  These Care Teams include your primary Cardiologist (physician) and Advanced Practice Providers (APPs -  Physician Assistants and Nurse Practitioners) who all work together to provide you with the care you need, when you need it.     Your next appointment:   12 month(s)  The format for your next appointment:   In Person  Provider:   Bryan Lemma, MD    Other Instructions   s

## 2023-07-11 NOTE — Progress Notes (Signed)
 Cardiology Office Note:  .   Date:  07/16/2023  ID:  [name] [name], DOB 1945-11-29, MRN [id number] PCP: Avel Peace Chales Salmon, MD  [facility] Providers Cardiologist:  Bryan Lemma, MD     Chief Complaint  Patient presents with   Follow-up    No major issues.   Atrial Fibrillation    No signs of recurrence.    Patient Profile: [name] [name] is an obese [age] y.o. female  with a PMH notable for PAF, frequent PVCs, hypertension with hypertensive heart disease (resolved NICM), and hyperlipidemia who presents here for annual follow-up at the request of [name], MD.  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     [name] [name] was last seen in March 2024 by Oris Drone.  I last saw her in July 2023-at that time she was on Ozempic and try to lose weight.  No recurrent episodes of A-fib.  When she saw Verdon Cummins, she was also still doing well.  Stable.  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  No changes made.  Plan follow-up..  Subjective  Discussed the use of AI scribe software for clinical note transcription with the patient, who gave verbal consent to proceed.  History of Present Illness History of Present Illness [name] [name] is a 78 year old female with left bundle branch block and atrial fibrillation who presents for a routine cardiology follow-up.  She last had an echocardiogram in 2020, which showed an ejection fraction of 60-65%, indicating normal pump function. She has not experienced significant episodes of palpitations in the past few months, with any episodes lasting only minutes.  Her current medication regimen includes apixaban 5 mg twice daily, furosemide 20 mg daily, metoprolol tartrate 25 mg daily, losartan 50 mg  daily, and rosuvastatin 40 mg daily. She reports no bleeding issues with apixaban and no unusual swelling with furosemide. She takes her medications consistently and requires a refill for apixaban.  She has been on semaglutide for weight management, with her weight fluctuating over the past year. She weighed 204 pounds in December 2024, down from 224 pounds in May 2023. She is currently working with a new primary care physician after her previous doctor passed away.  No shortness of breath, chest pain, dizziness, or bleeding issues. She also denies any stroke-like symptoms or significant changes in her health status since her last visit.    Objective    Studies Reviewed: Marland Kitchen   EKG Interpretation Date/Time:  Tuesday July 11 2023 13:39:48 EDT Ventricular Rate:  93 PR Interval:  170 QRS Duration:  140 QT Interval:  396 QTC Calculation: 492 R Axis:   -24  Text Interpretation: Normal sinus rhythm Left bundle branch block When compared with ECG of 02-Aug-2021 12:19, No significant change since Confirmed by Bryan Lemma ([id number]) on 07/11/2023 2:04:35 PM     LABS Total cholesterol: 139 (03/20/2023) HDL: 58 (03/20/2023) LDL: 60 (03/20/2023) Triglycerides: 124 (03/20/2023) A1c: 6.1 (03/20/2023) Hemoglobin: 5 (03/20/2023) Creatinine: 1.16 (03/20/2023) Potassium: 3.2 (03/20/2023) Magnesium: 2.0 (03/20/2023) TSH: 2.13 (03/20/2023) BNP: 110 (06/13/2023) Hemoglobin: 9.0 (06/13/2023)  DIAGNOSTIC Echocardiogram: EF 60-65%, no RWMA, GL1DD, moderate dilation, mild progression of diastolic function (04/05/2019) Event monitor: Sinus rhythm with LBBB.  Heart rate range  62-130 bpm and average 80 bpm.  1 episode of 5 beat atrial run max rate 120 bpm.  Rare PACs PVCs.  (March 2021) Cardiac Catheterization May 2020: Nonischemic cardiopathy with no angiographic evidence of CAD.  Normal filling pressures.  Risk Assessment/Calculations:    CHA2DS2-VASc Score = 4   This indicates a 4.8% annual risk of  stroke. The patient's score is based upon: CHF History: 0 HTN History: 1 Diabetes History: 0 Stroke History: 0 Vascular Disease History: 0 Age Score: 2 Gender Score: 1   Stable dose DOAC      Physical Exam:   VS:  BP 112/62 (BP Location: Right Arm, Patient Position: Sitting, Cuff Size: Large)   Pulse 92   Ht 5' 5.5" (1.664 m)   Wt 203 lb (92.1 kg)   SpO2 96%   BMI 33.27 kg/m    Wt Readings from Last 3 Encounters:  07/11/23 203 lb (92.1 kg)  03/20/23 204 lb (92.5 kg)  12/25/22 209 lb (94.8 kg)    GEN: Well nourished, well developed in no acute distress; 33 NECK: No JVD; No carotid bruits CARDIAC: distant S1, S2; RRR, Can't exclude soft 1/6 SEM.  Otherwise No murmurs, rubs, gallops RESPIRATORY:  Clear to auscultation without rales, wheezing or rhonchi ; nonlabored, good air movement. ABDOMEN: Soft, non-tender, non-distended EXTREMITIES:  No edema; No deformity     ASSESSMENT AND PLAN: .    Problem List Items Addressed This Visit       Cardiology Problems   (HFpEF) heart failure with preserved ejection fraction (HCC) (Chronic)   No increased swelling or dyspnea, indicating well-managed heart failure. - Continue furosemide 20 mg daily along with 50 mg losartan and 25 mg Toprol XL -Can use additional doses of furosemide for worsening edema or dyspnea.  Has not required any.      Relevant Medications   apixaban (ELIQUIS) 5 MG TABS tablet   furosemide (LASIX) 20 MG tablet   rosuvastatin (CRESTOR) 40 MG tablet   Essential hypertension (Chronic)   Blood pressure is well-controlled on current medications. No symptoms indicating poor control. 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 -Continue Toprol 25 mg daily      Relevant Medications   apixaban (ELIQUIS) 5 MG TABS tablet   furosemide (LASIX) 20 MG tablet   rosuvastatin (CRESTOR) 40 MG tablet   Hypercoagulable state due to paroxysmal atrial fibrillation (HCC) (Chronic)   CHA2DS2-VASc score is 4.  On standing dose of Eliquis.  No bleeding issues.  Okay to hold Eliquis 2 to 3 days preop for surgical procedures.  Per protocol No bridging required      Relevant Medications   apixaban (ELIQUIS) 5 MG TABS tablet   furosemide (LASIX) 20 MG tablet   rosuvastatin (CRESTOR) 40 MG tablet   Hyperlipidemia, mixed (Chronic)   Lipid panel from December showed total cholesterol of 139 mg/dL, HDL of 58 mg/dL, LDL of 60 mg/dL, and triglycerides of 132 mg/dL, indicating good control. - Continue rosuvastatin 40 mg daily      Relevant Medications   apixaban (ELIQUIS) 5 MG TABS tablet  furosemide (LASIX) 20 MG tablet   rosuvastatin (CRESTOR) 40 MG tablet   Left bundle branch block (Chronic)   Echocardiogram from 2020 showed normal ejection fraction of 60-65%. The septum bounces due to the left bundle branch block, potentially affecting cardiac function over time. - Monitor cardiac function with periodic echocardiograms      Relevant Medications   apixaban (ELIQUIS) 5 MG TABS tablet   furosemide (LASIX) 20 MG tablet   rosuvastatin (CRESTOR) 40 MG tablet   Non-ischemic cardiomyopathy (HCC) (Chronic)   Resolved.  Continue to monitor and the setting of the LBBB and A-fib      Relevant Medications   apixaban (ELIQUIS) 5 MG TABS tablet   furosemide (LASIX) 20 MG tablet   rosuvastatin (CRESTOR) 40 MG tablet   Paroxysmal atrial fibrillation (HCC): CHA2DS2-VASc Score  4 ->  Eliquis - Primary (Chronic)   Atrial fibrillation with infrequent episodes of palpitations lasting minutes.  No recent prolonged episodes.  On apixaban for  stroke prevention without bleeding issues.  On stable dose of Toprol for rate control. - Continue apixaban 5 mg twice daily and Toprol 25 mg daily => both meds need refilling . - Monitor for prolonged episodes of atrial fibrillation      Relevant Medications   apixaban (ELIQUIS) 5 MG TABS tablet   furosemide (LASIX) 20 MG tablet   rosuvastatin (CRESTOR) 40 MG tablet     Other   Severe obesity with body mass index (BMI) of 35.0 to 39.9 with comorbidity (HCC) (Chronic)   On Ozempic for weight management with some weight fluctuation. May have plateaued on the current dose. Goal is to reduce BMI to around 25. Discussed switching to [facility] for better outcomes. - Discuss with primary care provider the possibility of switching to [facility] for weight management        Follow-Up: Return in about 1 year (around 07/10/2024) for [employer], Routine follow up with me.     Signed, Marykay Lex, MD, MS Bryan Lemma, M.D., M.S. Interventional Cardiologist  [facility]  Pager # [phone] Phone # [phone] [address] [place], [place] [address]

## 2023-07-16 ENCOUNTER — Encounter: Payer: Self-pay | Admitting: Cardiology

## 2023-07-16 DIAGNOSIS — D6869 Other thrombophilia: Secondary | ICD-10-CM | POA: Insufficient documentation

## 2023-07-16 NOTE — Assessment & Plan Note (Signed)
 On Ozempic for weight management with some weight fluctuation. May have plateaued on the current dose. Goal is to reduce BMI to around 25. Discussed switching to [facility] for better outcomes. - Discuss with primary care provider the possibility of switching to [facility] for weight management

## 2023-07-16 NOTE — Assessment & Plan Note (Addendum)
 CHA2DS2-VASc score is 4.  On standing dose of Eliquis.  No bleeding issues.  Okay to hold Eliquis 2 to 3 days preop for surgical procedures.  Per protocol No bridging required

## 2023-07-16 NOTE — Assessment & Plan Note (Signed)
 Echocardiogram from 2020 showed normal ejection fraction of 60-65%. The septum bounces due to the left bundle branch block, potentially affecting cardiac function over time. - Monitor cardiac function with periodic echocardiograms

## 2023-07-16 NOTE — Assessment & Plan Note (Addendum)
 Resolved.  Continue to monitor and the setting of the LBBB and A-fib

## 2023-07-16 NOTE — Assessment & Plan Note (Addendum)
 Atrial fibrillation with infrequent episodes of palpitations lasting minutes.  No recent prolonged episodes.  On apixaban for stroke prevention without bleeding issues.  On stable dose of Toprol for rate control. - Continue apixaban 5 mg twice daily and Toprol 25 mg daily => both meds need refilling . - Monitor for prolonged episodes of atrial fibrillation

## 2023-07-16 NOTE — Assessment & Plan Note (Signed)
 No increased swelling or dyspnea, indicating well-managed heart failure. - Continue furosemide 20 mg daily along with 50 mg losartan and 25 mg Toprol XL -Can use additional doses of furosemide for worsening edema or dyspnea.  Has not required any.

## 2023-07-16 NOTE — Assessment & Plan Note (Signed)
 Lipid panel from December showed total cholesterol of 139 mg/dL, HDL of 58 mg/dL, LDL of 60 mg/dL, and triglycerides of 161 mg/dL, indicating good control. - Continue rosuvastatin 40 mg daily

## 2023-07-16 NOTE — Assessment & Plan Note (Signed)
 Feels to be controlled with beta-blocker at current dose. -Continue Toprol 25 mg daily

## 2023-07-16 NOTE — Assessment & Plan Note (Signed)
 Blood pressure is well-controlled on current medications. No symptoms indicating poor control. - Continue current antihypertensive regimen including losartan 50 mg and metoprolol succinate (Toprol-XL) 25 mg daily -Refill Toprol, losartan

## 2023-07-20 ENCOUNTER — Ambulatory Visit
Admission: RE | Admit: 2023-07-20 | Discharge: 2023-07-20 | Discharge status: Home / Self Care | Source: Ambulatory Visit | Attending: Internal Medicine | Admitting: Internal Medicine

## 2023-07-20 DIAGNOSIS — Z1231 Encounter for screening mammogram for malignant neoplasm of breast: Secondary | ICD-10-CM

## 2023-09-25 ENCOUNTER — Encounter: Payer: Medicare Other | Admitting: Internal Medicine

## 2024-01-01 ENCOUNTER — Emergency Department (HOSPITAL_BASED_OUTPATIENT_CLINIC_OR_DEPARTMENT_OTHER)
Admission: EM | Admit: 2024-01-01 | Discharge: 2024-01-01 | Disposition: A | Discharge status: Home / Self Care | Attending: Emergency Medicine | Admitting: Emergency Medicine

## 2024-01-01 ENCOUNTER — Other Ambulatory Visit: Payer: Self-pay

## 2024-01-01 ENCOUNTER — Emergency Department (HOSPITAL_BASED_OUTPATIENT_CLINIC_OR_DEPARTMENT_OTHER)

## 2024-01-01 ENCOUNTER — Encounter (HOSPITAL_BASED_OUTPATIENT_CLINIC_OR_DEPARTMENT_OTHER): Payer: Self-pay | Admitting: Emergency Medicine

## 2024-01-01 DIAGNOSIS — M545 Low back pain, unspecified: Secondary | ICD-10-CM | POA: Diagnosis present

## 2024-01-01 DIAGNOSIS — K5730 Diverticulosis of large intestine without perforation or abscess without bleeding: Secondary | ICD-10-CM | POA: Insufficient documentation

## 2024-01-01 DIAGNOSIS — I700 Atherosclerosis of aorta: Secondary | ICD-10-CM | POA: Diagnosis not present

## 2024-01-01 DIAGNOSIS — K449 Diaphragmatic hernia without obstruction or gangrene: Secondary | ICD-10-CM | POA: Insufficient documentation

## 2024-01-01 LAB — BASIC METABOLIC PANEL WITH GFR
Anion gap: 14 (ref 5–15)
BUN: 16 mg/dL (ref 8–23)
CO2: 22 mmol/L (ref 22–32)
Calcium: 9.6 mg/dL (ref 8.9–10.3)
Chloride: 103 mmol/L (ref 98–111)
Creatinine, Ser: 0.95 mg/dL (ref 0.44–1.00)
GFR, Estimated: >60 mL/min (ref >60)
Glucose, Bld: 123 mg/dL — ABNORMAL HIGH (ref 70–99)
Potassium: 4.2 mmol/L (ref 3.5–5.1)
Sodium: 139 mmol/L (ref 135–145)

## 2024-01-01 LAB — CBC WITH DIFFERENTIAL/PLATELET
Abs Immature Granulocytes: 0.03 K/uL (ref 0.00–0.07)
Basophils Absolute: 0.1 K/uL (ref 0.0–0.1)
Basophils Relative: 1 %
Eosinophils Absolute: 0.4 K/uL (ref 0.0–0.5)
Eosinophils Relative: 4 %
HCT: 33.2 % — ABNORMAL LOW (ref 36.0–46.0)
Hemoglobin: 10.5 g/dL — ABNORMAL LOW (ref 12.0–15.0)
Immature Granulocytes: 0 %
Lymphocytes Relative: 15 %
Lymphs Abs: 1.5 K/uL (ref 0.7–4.0)
MCH: 29 pg (ref 26.0–34.0)
MCHC: 31.6 g/dL (ref 30.0–36.0)
MCV: 91.7 fL (ref 80.0–100.0)
Monocytes Absolute: 0.9 K/uL (ref 0.1–1.0)
Monocytes Relative: 9 %
Neutro Abs: 6.8 K/uL (ref 1.7–7.7)
Neutrophils Relative %: 71 %
Platelets: 336 K/uL (ref 150–400)
RBC: 3.62 MIL/uL — ABNORMAL LOW (ref 3.87–5.11)
RDW: 14.6 % (ref 11.5–15.5)
WBC: 9.7 K/uL (ref 4.0–10.5)
nRBC: 0 % (ref 0.0–0.2)

## 2024-01-01 LAB — URINALYSIS, W/ REFLEX TO CULTURE (INFECTION SUSPECTED)
Bacteria, UA: NONE SEEN
Bilirubin Urine: NEGATIVE
Glucose, UA: NEGATIVE mg/dL
Hgb urine dipstick: NEGATIVE
Ketones, ur: NEGATIVE mg/dL
Nitrite: NEGATIVE
Protein, ur: 30 mg/dL — AB
Specific Gravity, Urine: 1.025 (ref 1.005–1.030)
Trans Epithel, UA: <1
pH: 5.5 (ref 5.0–8.0)

## 2024-01-01 MED ORDER — VALACYCLOVIR HCL 1 G PO TABS: 1000.0000 mg | ORAL_TABLET | Freq: Three times a day (TID) | ORAL | 0 refills | Status: DC

## 2024-01-01 MED ORDER — HYDROCODONE-ACETAMINOPHEN 5-325 MG PO TABS: 1.0000 | ORAL_TABLET | Freq: Four times a day (QID) | ORAL | 0 refills | Status: AC | PRN

## 2024-01-01 MED ORDER — IBUPROFEN 800 MG PO TABS
800.0000 mg | ORAL_TABLET | Freq: Once | ORAL | Status: DC
  Filled 2024-01-01: qty 1

## 2024-01-01 MED ORDER — LIDOCAINE 5 % EX PTCH: 1.0000 | MEDICATED_PATCH | CUTANEOUS | 0 refills | Status: AC

## 2024-01-01 MED ORDER — NAPROXEN 500 MG PO TABS: 500.0000 mg | ORAL_TABLET | Freq: Two times a day (BID) | ORAL | 0 refills | Status: AC

## 2024-01-01 MED ORDER — LIDOCAINE 5 % EX PTCH
1.0000 | MEDICATED_PATCH | CUTANEOUS | Status: DC
Start: 2024-01-01 — End: 2024-01-01
  Administered 2024-01-01: 1 via TRANSDERMAL
  Filled 2024-01-01: qty 1

## 2024-01-01 NOTE — Discharge Instructions (Signed)
 Your symptoms of right sided flank pain that wraparound to your abdomen could certainly be due to early shingles.  There is no current rash seen today however due to your symptoms, we will treat you for presumed early shingles.  Your symptoms could also be due to a muscle strain.  Your urinalysis, labs and CT imaging were reassuring today.

## 2024-01-01 NOTE — ED Provider Notes (Signed)
 [place] EMERGENCY DEPARTMENT AT [facility] Provider Note   CSN: [id number] Arrival date & time: 01/01/24  9445     Patient presents with: Back Pain   [name] [name] is a 78 y.o. female.    Back Pain    78 year old female with medical history significant for arthritis, anxiety, HLD, shingles, HTN, GERD's, diverticulosis, depression paroxysmal atrial fibrillation [name]  presenting to the emergency department with right-sided flank pain.  The patient states that she had gradual onset of pain starting yesterday afternoon.  Pain progressed to sharp pain in the right flank.  She denies any dysuria, endorses urinary frequency, no hematuria.  She endorses mild nausea, denies any vomiting.  She denies any fevers or chills.  Prior to Admission medications   Medication Sig Start Date End Date Taking? Authorizing Provider  HYDROcodone -acetaminophen  (NORCO/VICODIN) 5-325 MG tablet Take 1 tablet by mouth every 6 (six) hours as needed. 01/01/24  Yes Jerrol Agent, MD  lidocaine  (LIDODERM ) 5 % Place 1 patch onto the skin daily. Remove & Discard patch within 12 hours or as directed by MD 01/01/24  Yes Jerrol Agent, MD  naproxen  (NAPROSYN ) 500 MG tablet Take 1 tablet (500 mg total) by mouth 2 (two) times daily. 01/01/24  Yes Jerrol Agent, MD  valACYclovir  (VALTREX ) 1000 MG tablet Take 1 tablet (1,000 mg total) by mouth 3 (three) times daily. 01/01/24  Yes Jerrol Agent, MD  acetaminophen  (TYLENOL ) 500 MG tablet Take 1,000 mg by mouth every 6 (six) hours as needed for moderate pain.    [provider]  apixaban  (ELIQUIS ) 5 MG TABS tablet Take 1 tablet (5 mg total) by mouth 2 (two) times daily. 07/11/23   Anner Alm ORN, MD  buPROPion  (WELLBUTRIN  XL) 150 MG 24 hr tablet TAKE 1 TABLET BY MOUTH EVERY DAY IN THE MORNING Patient taking differently: Take 150 mg by mouth daily. 10/11/18   Craig Alan SAUNDERS, PA-C  Cholecalciferol  (VITAMIN D3) 250 MCG (10000 UT) TABS Take 5,000 Units  by mouth daily.    [provider]  Coenzyme Q10 (COQ10 PO) Take by mouth.    [provider]  COLACE 100 MG capsule Take 100 mg by mouth 2 (two) times daily. 04/28/20   [provider]  [employer] 12 HOUR NASAL DECONGESTANT 120 MG 12 hr tablet TAKE 1 TABLET BY MOUTH TWICE DAILY (EVERY 12 HOURS) FOR HEAD/CHEST CONGESTION & EAR FLUID 06/01/21   Wilkinson, Dana E, NP  cyclobenzaprine  (FLEXERIL ) 5 MG tablet Take 1 tablet (5 mg total) by mouth 3 (three) times daily as needed for muscle spasms. 03/20/23   Wilkinson, Dana E, NP  DULoxetine  (CYMBALTA ) 30 MG capsule TAKE 1 CAPSULE BY MOUTH 3 TIMES A DAY 10/26/21   Wilkinson, Dana E, NP  furosemide  (LASIX ) 20 MG tablet Take 1 tablet (20 mg total) by mouth daily. 07/11/23   Anner Alm ORN, MD  gabapentin  (NEURONTIN ) 300 MG capsule TAKE 1 CAPSULE BY MOUTH TWICE A DAY FOR CHRONIC PAIN 12/13/22   Wilkinson, Dana E, NP  hydrocortisone  cream 1 % Apply to affected area 2 times daily 12/25/22   Neysa Caron PARAS, DO  hydrOXYzine  (ATARAX ) 50 MG tablet TAKE 1 TO 2 TABLETS 1 TO 2 HOURS BEFORE BEDTIME AS NEEDED FOR SLEEP Patient not taking: Reported on 07/11/2023 12/21/22   Cranford, Tonya, NP  ipratropium (ATROVENT ) 0.03 % nasal spray Place 2 sprays into the nose every Monday, Tuesday, Wednesday, Thursday, and Friday. 05/23/23 05/22/24  Webb, Padonda B, FNP  ketoconazole  (NIZORAL ) 2 %  shampoo USE AS DIRECTED TWICE A WEEK 10/29/20   Wilkinson, Dana E, NP  levothyroxine  (SYNTHROID ) 50 MCG tablet 1 tab daily Please take your thyroid  medication greater than 30 min before breakfast, separated by at least 4 hours  from antacids, calcium , iron, and multivitamins. 02/21/23   Wilkinson, Dana E, NP  losartan  (COZAAR ) 50 MG tablet TAKE 1 TABLET BY MOUTH EVERY DAY 06/19/23   Anner Alm ORN, MD  meclizine  (ANTIVERT ) 25 MG tablet 1/2-1 pill up to 3 times daily for motion sickness/dizziness. Caution, may cause drowsiness. Patient taking differently: Take 25 mg by mouth 3 (three)  times daily as needed for dizziness. 05/24/19   Jeanine Knee, NP  metoprolol  succinate (TOPROL -XL) 25 MG 24 hr tablet TAKE 1 TABLET (25 MG TOTAL) BY MOUTH DAILY. 06/19/23   Anner Alm ORN, MD  nystatin  cream (MYCOSTATIN ) APPLY TO AFFECTED AREA TWICE A DAY 09/04/22   Tonita Fallow, MD  omeprazole  (PRILOSEC) 40 MG capsule TAKE 1 CAPSULE 2 X /DAY TO PREVENT HEARTBURN & INDIGESTION 09/04/22   Tonita Fallow, MD  ondansetron  (ZOFRAN ) 8 MG tablet Take 1/2 to 1 3 x /day before meals as needed for Nausea 03/20/23   Wilkinson, Dana E, NP  potassium chloride  SA (KLOR-CON  M20) 20 MEQ tablet Take 1 tablet (20 mEq total) by mouth daily. 07/11/23   Anner Alm ORN, MD  rosuvastatin  (CRESTOR ) 40 MG tablet TAKE 1 TABLET BY MOUTH EVERY DAY EXCEPT TAKE 2 TABLETS BY MOUTH ON MONDAYS 07/11/23   Anner Alm ORN, MD  Semaglutide , 2 MG/DOSE, (OZEMPIC , 2 MG/DOSE,) 8 MG/3ML SOPN Inject 2 mg into Skin every 7 days for Diabetes  (Dx: e11.29 ) 09/04/22   Tonita Fallow, MD    Allergies: Requip [ropinirole hcl], Minocycline, Tetracyclines & related, Zestril [lisinopril], Zetia [ezetimibe], Codeine, Morphine sulfate, Spironolactone , and Sulfa antibiotics    Review of Systems  Musculoskeletal:  Positive for back pain.    Updated Vital Signs BP (!) 135/98   Pulse 91   Temp 97.8 F (36.6 C) (Oral)   Resp 20   Ht 5' 5 (1.651 m)   Wt 92.1 kg   SpO2 100%   BMI 33.78 kg/m   Physical Exam Vitals and nursing note reviewed.  Constitutional:      General: She is not in acute distress.    Appearance: She is well-developed.  HENT:     Head: Normocephalic and atraumatic.  Eyes:     Conjunctiva/sclera: Conjunctivae normal.     Pupils: Pupils are equal, round, and reactive to light.  Cardiovascular:     Rate and Rhythm: Normal rate and regular rhythm.     Heart sounds: No murmur heard. Pulmonary:     Effort: Pulmonary effort is normal. No respiratory distress.     Breath sounds: Normal breath sounds.  Abdominal:      General: There is no distension.     Palpations: Abdomen is soft.     Tenderness: There is no abdominal tenderness. There is right CVA tenderness. There is no left CVA tenderness or guarding.  Musculoskeletal:        General: No swelling, deformity or signs of injury.     Cervical back: Neck supple.  Skin:    General: Skin is warm and dry.     Capillary Refill: Capillary refill takes less than 2 seconds.     Findings: No lesion or rash.  Neurological:     General: No focal deficit present.     Mental Status: She is alert.  Mental status is at baseline.  Psychiatric:        Mood and Affect: Mood normal.     (all labs ordered are listed, but only abnormal results are displayed) Labs Reviewed  CBC WITH DIFFERENTIAL/PLATELET - Abnormal; Notable for the following components:      Result Value   RBC 3.62 (*)    Hemoglobin 10.5 (*)    HCT 33.2 (*)    All other components within normal limits  BASIC METABOLIC PANEL WITH GFR - Abnormal; Notable for the following components:   Glucose, Bld 123 (*)    All other components within normal limits  URINALYSIS, W/ REFLEX TO CULTURE (INFECTION SUSPECTED) - Abnormal; Notable for the following components:   APPearance HAZY (*)    Protein, ur 30 (*)    Leukocytes,Ua MODERATE (*)    All other components within normal limits    EKG: None  Radiology: CT Renal Stone Study Result Date: 01/01/2024 EXAM: CT ABDOMEN AND PELVIS WITHOUT CONTRAST 01/01/2024 06:35:25 AM TECHNIQUE: CT of the abdomen and pelvis was performed without the administration of intravenous contrast. Multiplanar reformatted images are provided for review. Automated exposure control, iterative reconstruction, and/or weight-based adjustment of the mA/kV was utilized to reduce the radiation dose to as low as reasonably achievable. COMPARISON: None available. CLINICAL HISTORY: Abdominal/flank pain, stone suspected. Table formatting from the original note was not included. Patient  reports lower back pain since yesterday. Denies pain with urination but endorses increased frequency. FINDINGS: LOWER CHEST: 3 mm right lung base nodule identified, image 20/3. LIVER: No suspicious liver abnormality. GALLBLADDER AND BILE DUCTS: Status post cholecystectomy. No bile duct dilatation. SPLEEN: No acute abnormality. PANCREAS: No acute abnormality. ADRENAL GLANDS: No acute abnormality. KIDNEYS, URETERS AND BLADDER: No stones in the kidneys or ureters. No hydronephrosis. No perinephric or periureteral stranding. Urinary bladder is unremarkable. GI AND BOWEL: Moderate-sized hiatal hernia. The appendix is visualized and appears normal. Sigmoid diverticulosis without signs of acute diverticulitis. PERITONEUM AND RETROPERITONEUM: No ascites. No free air. VASCULATURE: Aortic atherosclerotic calcification. LYMPH NODES: No signs of abdominal or pelvic adenopathy. REPRODUCTIVE ORGANS: No acute abnormality. BONES AND SOFT TISSUES: Status post posterior hardware fixation at L4-5. L5-S1 degenerative disc disease. IMPRESSION: 1. No acute findings in the abdomen or pelvis related to the clinical history of abdominal/flank pain and suspected stone. 2. Hiatal hernia, moderate-sized. 3. Aortic atherosclerotic calcification. 4. Sigmoid diverticulosis without signs of acute diverticulitis. 5. 3 mm right lung base pulmonary nodule.In a patient who was at low risk, no further follow-up is indicated. If the patient is at increased risk a follow-up CT of the chest without contrast material in 12 months may be considered Electronically signed by: Waddell Calk MD 01/01/2024 06:45 AM EDT RP Workstation: [id number]     Procedures   Medications Ordered in the ED - No data to display                                  Medical Decision Making Amount and/or Complexity of Data Reviewed Labs: ordered. Radiology: ordered.  Risk Prescription drug management.   78 year old female with medical history significant for  arthritis, anxiety, HLD, shingles, HTN, GERD's, diverticulosis, depression paroxysmal atrial fibrillation [name]  presenting to the emergency department with right-sided flank pain.  The patient states that she had gradual onset of pain starting yesterday afternoon.  Pain progressed to sharp pain in the right flank.  She denies any dysuria, endorses  urinary frequency, no hematuria.  She endorses mild nausea, denies any vomiting.  She denies any fevers or chills.  On arrival,  The patient was afebrile, not tachycardic or tachypneic, saturating well on room air, hemodynamically stable.  On exam she had right-sided CVA tenderness to palpation.  No rash was appreciated.  She does state that the pain wraps in a bandlike cross to her abdomen.  Considered pyelonephritis, UTI, nephrolithiasis, musculoskeletal strain, shingles.  Patient was administered lidocaine  patch, urinalysis was performed in the setting of frequency which was negative for UTI, negative nitrites, 0-5 WBCs, no bacteria seen.  CBC was performed without a leukocytosis, mild anemia was noted to 10.5, BMP was unremarkable  CT Stone Study: IMPRESSION:  1. No acute findings in the abdomen or pelvis related to the clinical history  of abdominal/flank pain and suspected stone.  2. Hiatal hernia, moderate-sized.  3. Aortic atherosclerotic calcification.  4. Sigmoid diverticulosis without signs of acute diverticulitis.  5. 3 mm right lung base pulmonary nodule.In a patient who was at low risk, no  further follow-up is indicated. If the patient is at increased risk a follow-up  CT of the chest without contrast material in 12 months may be considered   Patient with overall reassuring workup, no evidence of nephrolithiasis, no evidence of pyelonephritis, urinalysis without evidence of UTI.  Given patient pain in a dermatomal distribution, considered early shingles.  Will treat for the same.  Advised close follow-up with her PCP to ensure  resolution and for repeat assessment.     Final diagnoses:  Acute right-sided low back pain without sciatica    ED Discharge Orders          Ordered    valACYclovir  (VALTREX ) 1000 MG tablet  3 times daily        01/01/24 0703    naproxen  (NAPROSYN ) 500 MG tablet  2 times daily        01/01/24 0703    HYDROcodone -acetaminophen  (NORCO/VICODIN) 5-325 MG tablet  Every 6 hours PRN        01/01/24 0703    lidocaine  (LIDODERM ) 5 %  Every 24 hours        01/01/24 0703               Jerrol Agent, MD 01/01/24 1749

## 2024-01-01 NOTE — ED Triage Notes (Signed)
 Patient reports lower back pain since yesterday. Denies pain with urination but endorses increased frequency. Denies fevers.

## 2024-01-29 NOTE — Progress Notes (Signed)
 Date of service: 01/29/2024  PROCEDURE: Thoracic facet joint injections with fluoroscopy  SURGEON: Rockey Pae, MD  ANESTHESIA: Local  PREOPERATIVE DIAGNOSES:  Thoracic back pain without radiculopathy chronic. 2.  Thoracic facet arthropathy chronic 3.  Thoracic spondylosis  POSTOPERATIVE DIAGNOSES: Thoracic back pain without radiculopathy chronic. 2.  Thoracic facet arthropathy chronic 3.  Thoracic spondylosis  LEVELS TREATED: Bilateral T10-11 and T11-12 with needles at T10, 11, 12   COMPLICATIONS: None  THERAPEUTIC AGENT: 1cc 0.25% Bupivacaine  per needle   BLOOD LOSS: minimal  COMPLICATIONS: none  Procedure No: 1/2  DESCRIPTION OF PROCEDURE: Patient was identified in the exam room prior to procedure.  Informed consent including discussion of risks(including pneumothorax risk and warning signs), benefits and alternatives procedure obtained.  Patient was then positioned in the prone position.  The cervical lumbar thoracic area and prepped and draped in sterile fashion with sterilizing agent.  AP fluoroscopy was then used to identify the above treated levels.  The junction of the rib in the vertebral body was then identified and marked.  For all above-mentioned levels.  1% lidocaine  was then used for local anesthesia.  At 22-gauge 3-1/2 inch needle was advanced toward this junction using a written fluoroscopic guidance.  Once the needle was appropriately positioned adjacent to the facet joint, the procedure was repeated at the above-mentioned levels in the exact same manner list above.  Once all needles were appropriately positioned adjacent to the facet joints.  Each one was aspirated was negative for heme or air.  Each needle was injected with therapeutic agent withdrawn intact.  Patient was observed in recovery without complication.  Patient was discharged home in stable condition.  ADDENDUM: Fluoroscopy time and Exposure, mRad were recorded and scanned into chart for procedure.  Image count stored.  This patient has had moderate to severe neck/low back pain that is predominately axial.  This pain has caused functional deficits and results in pain which is >/= to 4/10 on the VAS scale.  The pain has been present for >6 weeks and has not responded to conservative therapy including physical therapy, home exercise program, medication management, rest and passage of time.  After review of the applicable imaging and physical exam we deem the current pain to be facet joint mediated.  Therefore, we will proceed with facet joint injections utilizing the medial branch technique.  If Tonda Maryruth Pouch finds benefit of >80% with two sets of medial branch blocks, we will plan to proceed with radiofrequency ablation of the previously treated nerves.

## 2024-02-01 NOTE — Progress Notes (Signed)
 Assessment and Plan   1. Dysuria (Primary) -     POCT Urinalysis Dipstick -     cephALEXin  (KEFLEX ) 500 mg capsule; Take one capsule (500 mg dose) by mouth 4 (four) times daily for 10 days., Starting Thu 02/01/2024, Until Sun 02/11/2024, Normal -     Culture, Urine - including low colony counts Urine; Future 2. Stage 3a chronic kidney disease (*) -     Albumin /Creatinine Ratio, Random Urine; Future -     Comprehensive Metabolic Panel; Future 3. Hematuria, unspecified type -     CBC And Differential; Future    Low back pain    Keflex  1 tablet twice a day for 7 days  Leg pain new since epidural on Monday   She did not stop her eliquis  for her epidural  Ct scan 01/01/2024 showed no renal stone      Risks, benefits, and alternatives of the medications and treatment plan prescribed today were discussed, and patient expressed understanding. Plan follow-up as discussed or as needed if any worsening symptoms or change in condition.      Subjective   [name] [name] is a 78 y.o. (DOB February 22, 1946) female who presents for the following:    Patient presents with  . Flank Pain    Flank Pain Pertinent negatives include no chest pain.     Reviewed and updated this visit by provider: Tobacco  Allergies  Meds  Problems  Med Hx  Surg Hx  Fam Hx  PDMP      Review of Systems  Respiratory:  Negative for shortness of breath and wheezing.   Cardiovascular:  Negative for chest pain, palpitations and leg swelling.  Genitourinary:  Positive for flank pain. Negative for difficulty urinating and urgency.  Musculoskeletal:  Negative for gait problem.  Neurological:  Negative for speech difficulty and light-headedness.  Hematological:  Does not bruise/bleed easily.          Objective   Vitals:   02/01/24 1450  BP: 124/76  Patient Position: Sitting  Pulse: 82  Temp: 98.3 F (36.8 C)  TempSrc: Oral  Resp: 16  Height: 5' 6 (1.676 m)  Weight: 219 lb (99.3 kg)  SpO2: 99%   BMI (Calculated): 35.4    Physical Exam Vitals and nursing note reviewed.  Constitutional:      General: She is not in acute distress.    Appearance: Normal appearance. She is well-developed. She is not ill-appearing, toxic-appearing or diaphoretic.  HENT:     Head: Normocephalic and atraumatic.     Right Ear: External ear normal.     Left Ear: External ear normal.     Nose: Nose normal.  Eyes:     Pupils: Pupils are equal, round, and reactive to light.  Neck:     Thyroid : No thyromegaly.     Vascular: No carotid bruit.     Trachea: No tracheal deviation.  Cardiovascular:     Rate and Rhythm: Normal rate and regular rhythm.     Heart sounds: Normal heart sounds. No murmur heard.    No friction rub. No gallop.  Musculoskeletal:     Cervical back: Normal range of motion and neck supple.  Pulmonary:     Effort: Pulmonary effort is normal. No respiratory distress.     Breath sounds: Normal breath sounds. No wheezing.  Abdominal:     General: Bowel sounds are normal. There is no distension.     Palpations: Abdomen is soft. There is no mass.  Tenderness: There is no abdominal tenderness. There is no guarding or rebound.     Hernia: No hernia is present.  Lymphadenopathy:     Cervical: No cervical adenopathy.  Skin:    General: Skin is warm and dry.     Coloration: Skin is not pale.     Findings: No erythema or rash.  Neurological:     Mental Status: She is alert and oriented to person, place, and time.     Gait: Gait normal.     Deep Tendon Reflexes: Reflexes are normal and symmetric.  Psychiatric:        Mood and Affect: Mood normal.        Behavior: Behavior normal.        Thought Content: Thought content normal.        Judgment: Judgment normal.

## 2024-02-02 ENCOUNTER — Emergency Department (HOSPITAL_COMMUNITY)

## 2024-02-02 ENCOUNTER — Encounter (HOSPITAL_COMMUNITY): Payer: Self-pay | Admitting: Emergency Medicine

## 2024-02-02 ENCOUNTER — Other Ambulatory Visit: Payer: Self-pay

## 2024-02-02 ENCOUNTER — Inpatient Hospital Stay (HOSPITAL_COMMUNITY)
Admission: EM | Admit: 2024-02-02 | Discharge: 2024-02-04 | DRG: 393 | Disposition: A | Discharge status: Home / Self Care | Source: Ambulatory Visit | Attending: Internal Medicine | Admitting: Internal Medicine

## 2024-02-02 DIAGNOSIS — I2510 Atherosclerotic heart disease of native coronary artery without angina pectoris: Secondary | ICD-10-CM | POA: Diagnosis present

## 2024-02-02 DIAGNOSIS — Z7901 Long term (current) use of anticoagulants: Secondary | ICD-10-CM | POA: Diagnosis not present

## 2024-02-02 DIAGNOSIS — K317 Polyp of stomach and duodenum: Secondary | ICD-10-CM | POA: Diagnosis present

## 2024-02-02 DIAGNOSIS — I110 Hypertensive heart disease with heart failure: Secondary | ICD-10-CM | POA: Diagnosis present

## 2024-02-02 DIAGNOSIS — K921 Melena: Principal | ICD-10-CM

## 2024-02-02 DIAGNOSIS — Z9049 Acquired absence of other specified parts of digestive tract: Secondary | ICD-10-CM | POA: Diagnosis not present

## 2024-02-02 DIAGNOSIS — M1288 Other specific arthropathies, not elsewhere classified, other specified site: Secondary | ICD-10-CM | POA: Diagnosis present

## 2024-02-02 DIAGNOSIS — E669 Obesity, unspecified: Secondary | ICD-10-CM | POA: Diagnosis present

## 2024-02-02 DIAGNOSIS — I5032 Chronic diastolic (congestive) heart failure: Secondary | ICD-10-CM | POA: Diagnosis present

## 2024-02-02 DIAGNOSIS — I480 Paroxysmal atrial fibrillation: Secondary | ICD-10-CM | POA: Diagnosis present

## 2024-02-02 DIAGNOSIS — Z888 Allergy status to other drugs, medicaments and biological substances status: Secondary | ICD-10-CM

## 2024-02-02 DIAGNOSIS — K3189 Other diseases of stomach and duodenum: Secondary | ICD-10-CM | POA: Diagnosis not present

## 2024-02-02 DIAGNOSIS — N183 Chronic kidney disease, stage 3 unspecified: Secondary | ICD-10-CM | POA: Diagnosis not present

## 2024-02-02 DIAGNOSIS — I130 Hypertensive heart and chronic kidney disease with heart failure and stage 1 through stage 4 chronic kidney disease, or unspecified chronic kidney disease: Secondary | ICD-10-CM | POA: Diagnosis not present

## 2024-02-02 DIAGNOSIS — D649 Anemia, unspecified: Secondary | ICD-10-CM | POA: Diagnosis not present

## 2024-02-02 DIAGNOSIS — K922 Gastrointestinal hemorrhage, unspecified: Secondary | ICD-10-CM | POA: Diagnosis not present

## 2024-02-02 DIAGNOSIS — Z79890 Hormone replacement therapy (postmenopausal): Secondary | ICD-10-CM | POA: Diagnosis not present

## 2024-02-02 DIAGNOSIS — K2970 Gastritis, unspecified, without bleeding: Secondary | ICD-10-CM

## 2024-02-02 DIAGNOSIS — I428 Other cardiomyopathies: Secondary | ICD-10-CM | POA: Diagnosis present

## 2024-02-02 DIAGNOSIS — D62 Acute posthemorrhagic anemia: Secondary | ICD-10-CM | POA: Diagnosis present

## 2024-02-02 DIAGNOSIS — K31A11 Gastric intestinal metaplasia without dysplasia, involving the antrum: Secondary | ICD-10-CM | POA: Diagnosis not present

## 2024-02-02 DIAGNOSIS — R7989 Other specified abnormal findings of blood chemistry: Secondary | ICD-10-CM

## 2024-02-02 DIAGNOSIS — K219 Gastro-esophageal reflux disease without esophagitis: Secondary | ICD-10-CM | POA: Diagnosis present

## 2024-02-02 DIAGNOSIS — K2289 Other specified disease of esophagus: Secondary | ICD-10-CM | POA: Diagnosis not present

## 2024-02-02 DIAGNOSIS — R195 Other fecal abnormalities: Secondary | ICD-10-CM

## 2024-02-02 DIAGNOSIS — Z923 Personal history of irradiation: Secondary | ICD-10-CM | POA: Diagnosis not present

## 2024-02-02 DIAGNOSIS — E785 Hyperlipidemia, unspecified: Secondary | ICD-10-CM | POA: Diagnosis present

## 2024-02-02 DIAGNOSIS — F32A Depression, unspecified: Secondary | ICD-10-CM | POA: Diagnosis present

## 2024-02-02 DIAGNOSIS — Z83438 Family history of other disorder of lipoprotein metabolism and other lipidemia: Secondary | ICD-10-CM

## 2024-02-02 DIAGNOSIS — K7200 Acute and subacute hepatic failure without coma: Secondary | ICD-10-CM | POA: Diagnosis present

## 2024-02-02 DIAGNOSIS — Z9842 Cataract extraction status, left eye: Secondary | ICD-10-CM

## 2024-02-02 DIAGNOSIS — Z823 Family history of stroke: Secondary | ICD-10-CM

## 2024-02-02 DIAGNOSIS — Z825 Family history of asthma and other chronic lower respiratory diseases: Secondary | ICD-10-CM | POA: Diagnosis not present

## 2024-02-02 DIAGNOSIS — Z8249 Family history of ischemic heart disease and other diseases of the circulatory system: Secondary | ICD-10-CM | POA: Diagnosis not present

## 2024-02-02 DIAGNOSIS — Z96652 Presence of left artificial knee joint: Secondary | ICD-10-CM | POA: Diagnosis present

## 2024-02-02 DIAGNOSIS — E876 Hypokalemia: Secondary | ICD-10-CM | POA: Diagnosis present

## 2024-02-02 DIAGNOSIS — E039 Hypothyroidism, unspecified: Secondary | ICD-10-CM | POA: Diagnosis present

## 2024-02-02 DIAGNOSIS — G8929 Other chronic pain: Secondary | ICD-10-CM | POA: Diagnosis present

## 2024-02-02 DIAGNOSIS — Z8616 Personal history of COVID-19: Secondary | ICD-10-CM

## 2024-02-02 DIAGNOSIS — Z882 Allergy status to sulfonamides status: Secondary | ICD-10-CM

## 2024-02-02 DIAGNOSIS — Z853 Personal history of malignant neoplasm of breast: Secondary | ICD-10-CM

## 2024-02-02 DIAGNOSIS — Z880 Allergy status to penicillin: Secondary | ICD-10-CM

## 2024-02-02 DIAGNOSIS — G4700 Insomnia, unspecified: Secondary | ICD-10-CM | POA: Diagnosis present

## 2024-02-02 DIAGNOSIS — I5030 Unspecified diastolic (congestive) heart failure: Secondary | ICD-10-CM | POA: Diagnosis not present

## 2024-02-02 DIAGNOSIS — Z885 Allergy status to narcotic agent status: Secondary | ICD-10-CM

## 2024-02-02 DIAGNOSIS — R7303 Prediabetes: Secondary | ICD-10-CM | POA: Diagnosis present

## 2024-02-02 DIAGNOSIS — Z9841 Cataract extraction status, right eye: Secondary | ICD-10-CM

## 2024-02-02 DIAGNOSIS — F419 Anxiety disorder, unspecified: Secondary | ICD-10-CM | POA: Diagnosis present

## 2024-02-02 DIAGNOSIS — Z79899 Other long term (current) drug therapy: Secondary | ICD-10-CM

## 2024-02-02 DIAGNOSIS — K2971 Gastritis, unspecified, with bleeding: Secondary | ICD-10-CM | POA: Diagnosis present

## 2024-02-02 DIAGNOSIS — D500 Iron deficiency anemia secondary to blood loss (chronic): Secondary | ICD-10-CM

## 2024-02-02 DIAGNOSIS — Z981 Arthrodesis status: Secondary | ICD-10-CM

## 2024-02-02 DIAGNOSIS — K449 Diaphragmatic hernia without obstruction or gangrene: Secondary | ICD-10-CM | POA: Diagnosis not present

## 2024-02-02 LAB — HEMOGLOBIN AND HEMATOCRIT, BLOOD
HCT: 23.2 % — ABNORMAL LOW (ref 36.0–46.0)
Hemoglobin: 7 g/dL — ABNORMAL LOW (ref 12.0–15.0)

## 2024-02-02 LAB — URINALYSIS, ROUTINE W REFLEX MICROSCOPIC
Bilirubin Urine: NEGATIVE
Glucose, UA: NEGATIVE mg/dL
Ketones, ur: NEGATIVE mg/dL
Leukocytes,Ua: NEGATIVE
Nitrite: NEGATIVE
Protein, ur: NEGATIVE mg/dL
Specific Gravity, Urine: 1.01 (ref 1.005–1.030)
pH: 6 (ref 5.0–8.0)

## 2024-02-02 LAB — PREPARE RBC (CROSSMATCH)

## 2024-02-02 LAB — CBC
HCT: 19.8 % — ABNORMAL LOW (ref 36.0–46.0)
Hemoglobin: 5.9 g/dL — CL (ref 12.0–15.0)
MCH: 27.8 pg (ref 26.0–34.0)
MCHC: 29.8 g/dL — ABNORMAL LOW (ref 30.0–36.0)
MCV: 93.4 fL (ref 80.0–100.0)
Platelets: 340 K/uL (ref 150–400)
RBC: 2.12 MIL/uL — ABNORMAL LOW (ref 3.87–5.11)
RDW: 15.1 % (ref 11.5–15.5)
WBC: 7.4 K/uL (ref 4.0–10.5)
nRBC: 0 % (ref 0.0–0.2)

## 2024-02-02 LAB — COMPREHENSIVE METABOLIC PANEL WITH GFR
ALT: 65 U/L — ABNORMAL HIGH (ref 0–44)
AST: 135 U/L — ABNORMAL HIGH (ref 15–41)
Albumin: 3.6 g/dL (ref 3.5–5.0)
Alkaline Phosphatase: 72 U/L (ref 38–126)
Anion gap: 9 (ref 5–15)
BUN: 21 mg/dL (ref 8–23)
CO2: 24 mmol/L (ref 22–32)
Calcium: 8.8 mg/dL — ABNORMAL LOW (ref 8.9–10.3)
Chloride: 107 mmol/L (ref 98–111)
Creatinine, Ser: 1.19 mg/dL — ABNORMAL HIGH (ref 0.44–1.00)
GFR, Estimated: 47 mL/min — ABNORMAL LOW (ref >60)
Glucose, Bld: 88 mg/dL (ref 70–99)
Potassium: 3.1 mmol/L — ABNORMAL LOW (ref 3.5–5.1)
Sodium: 141 mmol/L (ref 135–145)
Total Bilirubin: <0.2 mg/dL (ref 0.0–1.2)
Total Protein: 6.4 g/dL — ABNORMAL LOW (ref 6.5–8.1)

## 2024-02-02 LAB — HEPATITIS PANEL, ACUTE
HCV Ab: NONREACTIVE
Hep A IgM: NONREACTIVE
Hep B C IgM: NONREACTIVE
Hepatitis B Surface Ag: NONREACTIVE

## 2024-02-02 LAB — ACETAMINOPHEN LEVEL: Acetaminophen (Tylenol), Serum: <10 ug/mL — ABNORMAL LOW (ref 10–30)

## 2024-02-02 MED ORDER — ROSUVASTATIN CALCIUM 20 MG PO TABS
40.0000 mg | ORAL_TABLET | Freq: Every day | ORAL | Status: DC
  Administered 2024-02-02 – 2024-02-03 (×2): 40 mg via ORAL
  Filled 2024-02-02 (×2): qty 2

## 2024-02-02 MED ORDER — LEVOTHYROXINE SODIUM 50 MCG PO TABS
50.0000 ug | ORAL_TABLET | Freq: Every day | ORAL | Status: DC
  Administered 2024-02-03 – 2024-02-04 (×2): 50 ug via ORAL
  Filled 2024-02-02 (×2): qty 1

## 2024-02-02 MED ORDER — ALBUTEROL SULFATE (2.5 MG/3ML) 0.083% IN NEBU: 2.5000 mg | INHALATION_SOLUTION | RESPIRATORY_TRACT | Status: DC | PRN

## 2024-02-02 MED ORDER — CYCLOBENZAPRINE HCL 5 MG PO TABS: 5.0000 mg | ORAL_TABLET | Freq: Three times a day (TID) | ORAL | Status: DC | PRN

## 2024-02-02 MED ORDER — GABAPENTIN 300 MG PO CAPS
300.0000 mg | ORAL_CAPSULE | Freq: Two times a day (BID) | ORAL | Status: DC
  Administered 2024-02-02 – 2024-02-04 (×4): 300 mg via ORAL
  Filled 2024-02-02 (×4): qty 1

## 2024-02-02 MED ORDER — SODIUM CHLORIDE 0.9% IV SOLUTION: Freq: Once | INTRAVENOUS | Status: AC

## 2024-02-02 MED ORDER — ONDANSETRON HCL 4 MG PO TABS: 4.0000 mg | ORAL_TABLET | Freq: Four times a day (QID) | ORAL | Status: DC | PRN

## 2024-02-02 MED ORDER — PANTOPRAZOLE SODIUM 40 MG IV SOLR
40.0000 mg | Freq: Two times a day (BID) | INTRAVENOUS | Status: DC
  Administered 2024-02-03: 40 mg via INTRAVENOUS
  Filled 2024-02-02: qty 10

## 2024-02-02 MED ORDER — ONDANSETRON HCL 4 MG/2ML IJ SOLN: 4.0000 mg | Freq: Four times a day (QID) | INTRAMUSCULAR | Status: DC | PRN

## 2024-02-02 MED ORDER — MECLIZINE HCL 25 MG PO TABS: 12.5000 mg | ORAL_TABLET | Freq: Three times a day (TID) | ORAL | Status: DC | PRN

## 2024-02-02 MED ORDER — HYDROCODONE-ACETAMINOPHEN 5-325 MG PO TABS
1.0000 | ORAL_TABLET | Freq: Four times a day (QID) | ORAL | Status: DC | PRN
Start: 2024-02-02 — End: 2024-02-04

## 2024-02-02 MED ORDER — LOSARTAN POTASSIUM 50 MG PO TABS
50.0000 mg | ORAL_TABLET | Freq: Every day | ORAL | Status: DC
  Administered 2024-02-02 – 2024-02-04 (×2): 50 mg via ORAL
  Filled 2024-02-02 (×2): qty 1

## 2024-02-02 MED ORDER — PANTOPRAZOLE SODIUM 40 MG IV SOLR
40.0000 mg | INTRAVENOUS | Status: DC
  Administered 2024-02-02: 40 mg via INTRAVENOUS
  Filled 2024-02-02: qty 10

## 2024-02-02 MED ORDER — TRAZODONE HCL 50 MG PO TABS
25.0000 mg | ORAL_TABLET | Freq: Every evening | ORAL | Status: DC | PRN
  Administered 2024-02-03: 25 mg via ORAL
  Filled 2024-02-02: qty 1

## 2024-02-02 MED ORDER — ACETAMINOPHEN 650 MG RE SUPP: 650.0000 mg | Freq: Four times a day (QID) | RECTAL | Status: DC | PRN

## 2024-02-02 MED ORDER — POTASSIUM CHLORIDE CRYS ER 20 MEQ PO TBCR
40.0000 meq | EXTENDED_RELEASE_TABLET | Freq: Once | ORAL | Status: AC
  Administered 2024-02-02: 40 meq via ORAL
  Filled 2024-02-02: qty 2

## 2024-02-02 MED ORDER — IOHEXOL 350 MG/ML SOLN
80.0000 mL | Freq: Once | INTRAVENOUS | Status: AC | PRN
  Administered 2024-02-02: 100 mL via INTRAVENOUS

## 2024-02-02 MED ORDER — METOPROLOL SUCCINATE ER 25 MG PO TB24
25.0000 mg | ORAL_TABLET | Freq: Every day | ORAL | Status: DC
  Administered 2024-02-02 – 2024-02-04 (×2): 25 mg via ORAL
  Filled 2024-02-02 (×2): qty 1

## 2024-02-02 MED ORDER — SODIUM CHLORIDE 0.9 % IV SOLN: INTRAVENOUS | Status: AC

## 2024-02-02 MED ORDER — ACETAMINOPHEN 325 MG PO TABS: 650.0000 mg | ORAL_TABLET | Freq: Four times a day (QID) | ORAL | Status: DC | PRN

## 2024-02-02 NOTE — H&P (Signed)
 History and Physical  [name] [name] FMW:[id number] DOB: 1945-05-29 DOA: 02/02/2024  PCP: Dorcus Lamar POUR, MD   Chief Complaint: Dark stools, weakness  HPI: [name] [name] is a 78 y.o. female with medical history significant for paroxysmal atrial fibrillation [name] , hypothyroidism, hypertension, hyperlipidemia, GERD and heart failure with preserved EF being admitted to the hospital with acute blood loss anemia.  Patient states that starting about 3 to 4 weeks ago, she had some vague abdominal discomfort and bloating, and also started noticing that her stools were dark.  She had some vague nausea, but no vomiting.  No frank bleeding.  She did not make much of the dark stools, since she takes iron.  However she had not noticed that her stools were very dark, until most recently.  In any case, she has also been feeling weak for the last few days, finally went to see her PCP yesterday.  Lab work there apparently revealed significant anemia, she was called this morning and told to come to the ER for evaluation.  Though she has not seen anything in her urine, her PCP also told her that she has a significant amount of blood in her urine as well.  Review of Systems: Please see HPI for pertinent positives and negatives. A complete 10 system review of systems are otherwise negative.  Past Medical History:  Diagnosis Date   Anemia    Anxiety    Arthritis    Chronic back pain    cyst sitting on L4-5;slipped disc   Chronic heart failure with preserved ejection fraction (HFpEF) (HCC) 03/2019   EF improved from 25 and 30% back to 50 to 65% with GR 1 DD.   Depression    takes Cymbalta  daily   Diverticulosis    GERD (gastroesophageal reflux disease)    takes Omeprazole  daily   Headache(784.0)    rare   History of pancreatitis 03/27/2008   History of ulcer disease 08/06/2015   Hyperlipidemia    Hypertension    takes Cardura  nightly and Verapamil  daily   Hypothyroidism    Insomnia     takes Restoril  nightly   Malignant neoplasm of lower-inner quadrant of left breast in female, estrogen receptor positive (HCC) 05/20/2013   L Breast Cancer   Obesity (BMI 30-39.9) 06/11/2013   Paroxysmal atrial fibrillation (HCC): CHA2DS2-VASc Score - starting Eliquis  08/06/2015   This patients CHA2DS2-VASc Score and unadjusted Ischemic Stroke Rate (% per year) is equal to 4.8 % stroke rate/year from a score of 4  Above score calculated as 1 point each if present [CHF, HTN, DM, Vascular=MI/PAD/Aortic Plaque, Age if 65-74, or Female]; 2 points each if present [Age > 75, or Stroke/TIA/TE]   Personal history of radiation therapy    2015   Pneumonia due to COVID-19 virus 04/05/2019   PONV (postoperative nausea and vomiting)    Pre-diabetes    RESOLVED: Nonischemic cardiomyopathy (HCC) 07/2015   Normal coronary arteries by cath. EF was 25-30% by echo. GR2 DD - likely related to LBBB in setting of A. fib => EF as of 03/2019: 60 to 65%, no RWMA.  GR 1 DD.   S/P radiation therapy 07/18/2013-08/28/2013   1) Left Breast / 50 Gy in 25 fractions/ 2) Left Breast Boost / 10 Gy in 5 fractions   Shingles    Past Surgical History:  Procedure Laterality Date   ABDOMINAL HYSTERECTOMY     ANTERIOR CERVICAL DECOMP/DISCECTOMY FUSION N/A 09/24/2020   Procedure: ANTERIOR CERVICAL DECOMPRESSION FUSION CERVICAL 5-  CERVICAL 6, CERVICAL 6 -  CERVICAL 7 WITH INSTRUMENTATION AND ALLOGRAFT;  Surgeon: Beuford Anes, MD;  Location: [facility] OR;  Service: Orthopedics;  Laterality: N/A;   BACK SURGERY     BREAST LUMPECTOMY Left    1972 neg   BREAST LUMPECTOMY Left    2015 breast cancer   CARDIAC CATHETERIZATION N/A 08/10/2015   Procedure: Right/Left Heart Cath and Coronary Angiography;  Surgeon: Lonni JONETTA Cash, MD;  Location: [facility] INVASIVE CV LAB;  Service: Cardiovascular;: Nonobstructive CAD   CATARACT EXTRACTION BILATERAL W/ ANTERIOR VITRECTOMY Bilateral    CHOLECYSTECTOMY     COLONOSCOPY  2014   DUPUYTREN / PALMAR  FASCIOTOMY Bilateral 2007   x 3 to left and once to the right    epidural injections     x 3   FOOT TENDON SURGERY Right ~ 11/2015   stepped in a hole and tore 2 tendons   JOINT REPLACEMENT     KNEE ARTHROSCOPY Bilateral    LUMBAR FUSION  10/2013   Dr. Beuford   TONSILLECTOMY     TOTAL KNEE ARTHROPLASTY Left 09/23/2016   TOTAL KNEE ARTHROPLASTY Left 09/23/2016   Procedure: TOTAL KNEE ARTHROPLASTY;  Surgeon: Yvone Rush, MD;  Location: [facility] OR;  Service: Orthopedics;  Laterality: Left;   TRANSTHORACIC ECHOCARDIOGRAM  08/07/2015   Severely reduced LVEF at 25-30% with diffuse HK. GR 2 DD. Ventricular dyssynergy secondary to LBBB. Small to moderate pericardial effusion but no hemodynamic compromise   TRANSTHORACIC ECHOCARDIOGRAM  10/2015    (EF up from 25-30%): - Left ventricle: Poor acoustic windows -  difficult to see.    EF estimated at 45-50% with inferoseptal and possible posterior hypokinesis. GR 1 DD. No pericardial effusion.   TRANSTHORACIC ECHOCARDIOGRAM  04/05/2019   EF 60 to 65% without wall motion normality.  GR 1 DD.  Mild LA dilation.  Trivial MR.  Frequent PVCs noted.   TUBAL LIGATION     UPPER GI ENDOSCOPY     Social History:  reports that she has never smoked. She has never used smokeless tobacco. She reports current alcohol use. She reports that she does not use drugs.  Allergies  Allergen Reactions   Morphine Sulfate Palpitations   Requip [Ropinirole Hcl] Shortness Of Breath and Nausea And Vomiting   Amoxicillin Dermatitis and Rash   Codeine Other (See Comments)    Fatigue    Ezetimibe Other (See Comments)    MYALGIAS and JOINT PAIN   Minocycline Hives   Spironolactone  Rash and Dermatitis   Sulfa Antibiotics Other (See Comments)    Headache (pt states that a blood pressure medicine caused a severe headache but doesn't remember a reaction to sulfa)   Tetracyclines & Related Hives   Zestril [Lisinopril] Cough    Family History  Problem Relation Age of Onset    Heart attack Mother    Stroke Mother    COPD Father    Hypertension Father    Hyperlipidemia Sister    Breast cancer Sister        61s   Colon polyps Neg Hx    Esophageal cancer Neg Hx    Rectal cancer Neg Hx    Stomach cancer Neg Hx      Prior to Admission medications   Medication Sig Start Date End Date Taking? Authorizing Provider  acetaminophen  (TYLENOL ) 500 MG tablet Take 500-1,000 mg by mouth every 8 (eight) hours as needed for mild pain (pain score 1-3) (or headaches).   Yes [provider]  apixaban  (  ELIQUIS ) 5 MG TABS tablet Take 1 tablet (5 mg total) by mouth 2 (two) times daily. 07/11/23  Yes Anner Alm ORN, MD  Cholecalciferol  (VITAMIN D3) 250 MCG (10000 UT) TABS Take 5,000 Units by mouth daily.   Yes [provider]  Coenzyme Q10 (COQ10 PO) Take 1 capsule by mouth See admin instructions. Take 1 capsule by mouth once a day ON ALL DAYS CRESTOR /ROSUVASTATIN  is taken   Yes [provider]  cyclobenzaprine  (FLEXERIL ) 5 MG tablet Take 1 tablet (5 mg total) by mouth 3 (three) times daily as needed for muscle spasms. 03/20/23  Yes Wilkinson, Dana E, NP  DULoxetine  (CYMBALTA ) 60 MG capsule Take 60 mg by mouth in the morning.   Yes [provider]  furosemide  (LASIX ) 20 MG tablet Take 1 tablet (20 mg total) by mouth daily. 07/11/23  Yes Anner Alm ORN, MD  gabapentin  (NEURONTIN ) 300 MG capsule TAKE 1 CAPSULE BY MOUTH TWICE A DAY FOR CHRONIC PAIN Patient taking differently: Take 300 mg by mouth in the morning and at bedtime. 12/13/22  Yes Wilkinson, Dana E, NP  HYDROcodone -acetaminophen  (NORCO/VICODIN) 5-325 MG tablet Take 1 tablet by mouth every 6 (six) hours as needed. Patient taking differently: Take 1 tablet by mouth every 6 (six) hours as needed (for pain). 01/01/24  Yes Jerrol Agent, MD  hydrocortisone  cream 1 % Apply to affected area 2 times daily Patient taking differently: Apply 1 Application topically 2 (two) times daily as needed for  itching. 12/25/22  Yes Young, Caron PARAS, DO  hydrOXYzine  (ATARAX ) 50 MG tablet TAKE 1 TO 2 TABLETS 1 TO 2 HOURS BEFORE BEDTIME AS NEEDED FOR SLEEP Patient taking differently: Take 0.5-1 mg by mouth at bedtime as needed (for sleep). 12/21/22  Yes Cranford, Tonya, NP  ipratropium (ATROVENT ) 0.03 % nasal spray Place 2 sprays into the nose every Monday, Tuesday, Wednesday, Thursday, and Friday. Patient taking differently: Place 2 sprays into the nose in the morning. 05/23/23 05/22/24 Yes Webb, Padonda B, FNP  ketoconazole  (NIZORAL ) 2 % shampoo USE AS DIRECTED TWICE A WEEK Patient taking differently: Apply 1 Application topically See admin instructions. Shampoo 2 times a week 10/29/20  Yes Wilkinson, Dana E, NP  levothyroxine  (SYNTHROID ) 50 MCG tablet 1 tab daily Please take your thyroid  medication greater than 30 min before breakfast, separated by at least 4 hours  from antacids, calcium , iron, and multivitamins. Patient taking differently: Take 50 mcg by mouth daily before breakfast. Take 50 mcg by mouth at least 30 minutes before breakfast- separated by at least 4 hours  from antacids, calcium , iron, and multivitamins 02/21/23  Yes Wilkinson, Dana E, NP  losartan  (COZAAR ) 50 MG tablet TAKE 1 TABLET BY MOUTH EVERY DAY 06/19/23  Yes Anner Alm ORN, MD  meclizine  (ANTIVERT ) 25 MG tablet 1/2-1 pill up to 3 times daily for motion sickness/dizziness. Caution, may cause drowsiness. Patient taking differently: Take 12.5-25 mg by mouth 3 (three) times daily as needed for dizziness (or motion sickness- causes drowsiness). 05/24/19  Yes Jeanine Knee, NP  metoprolol  succinate (TOPROL -XL) 25 MG 24 hr tablet TAKE 1 TABLET (25 MG TOTAL) BY MOUTH DAILY. 06/19/23  Yes Anner Alm ORN, MD  naproxen  (NAPROSYN ) 500 MG tablet Take 1 tablet (500 mg total) by mouth 2 (two) times daily. Patient taking differently: Take 500 mg by mouth 2 (two) times daily as needed for mild pain (pain score 1-3) (TAKE WITH FOOD). 01/01/24  Yes  Jerrol Agent, MD  nystatin  cream (MYCOSTATIN ) APPLY TO AFFECTED AREA TWICE  A DAY Patient taking differently: Apply 1 Application topically 2 (two) times daily as needed (for Candidiasis of the skin). 09/04/22  Yes Tonita Fallow, MD  omeprazole  (PRILOSEC) 40 MG capsule TAKE 1 CAPSULE 2 X /DAY TO PREVENT HEARTBURN & INDIGESTION Patient taking differently: Take 40 mg by mouth in the morning and at bedtime. 09/04/22  Yes Tonita Fallow, MD  ondansetron  (ZOFRAN ) 8 MG tablet Take 1/2 to 1 3 x /day before meals as needed for Nausea Patient taking differently: Take 4-8 mg by mouth See admin instructions. Take 4-8 mg by mouth three times a day before meals as needed for nausea 03/20/23  Yes Wilkinson, Dana E, NP  rosuvastatin  (CRESTOR ) 40 MG tablet TAKE 1 TABLET BY MOUTH EVERY DAY EXCEPT TAKE 2 TABLETS BY MOUTH ON MONDAYS Patient taking differently: Take 40 mg by mouth at bedtime. 07/11/23  Yes Anner Alm ORN, MD  buPROPion  (WELLBUTRIN  XL) 150 MG 24 hr tablet TAKE 1 TABLET BY MOUTH EVERY DAY IN THE MORNING Patient not taking: Reported on 02/02/2024 10/11/18   Craig Alan SAUNDERS, PA-C  [facility] 12 HOUR NASAL DECONGESTANT 120 MG 12 hr tablet TAKE 1 TABLET BY MOUTH TWICE DAILY (EVERY 12 HOURS) FOR HEAD/CHEST CONGESTION & EAR FLUID Patient not taking: Reported on 02/02/2024 06/01/21   Wilkinson, Dana E, NP  DULoxetine  (CYMBALTA ) 30 MG capsule TAKE 1 CAPSULE BY MOUTH 3 TIMES A DAY Patient not taking: Reported on 02/02/2024 10/26/21   Wilkinson, Dana E, NP  lidocaine  (LIDODERM ) 5 % Place 1 patch onto the skin daily. Remove & Discard patch within 12 hours or as directed by MD Patient not taking: Reported on 02/02/2024 01/01/24   Jerrol Agent, MD  potassium chloride  SA (KLOR-CON  M20) 20 MEQ tablet Take 1 tablet (20 mEq total) by mouth daily. Patient not taking: Reported on 02/02/2024 07/11/23   Anner Alm ORN, MD  Semaglutide , 2 MG/DOSE, (OZEMPIC , 2 MG/DOSE,) 8 MG/3ML SOPN Inject 2 mg into Skin every 7 days for  Diabetes  (Dx: e11.29 ) Patient not taking: Reported on 02/02/2024 09/04/22   Tonita Fallow, MD  valACYclovir  (VALTREX ) 1000 MG tablet Take 1 tablet (1,000 mg total) by mouth 3 (three) times daily. Patient not taking: Reported on 02/02/2024 01/01/24   Jerrol Agent, MD    Physical Exam: BP (!) 145/69   Pulse 76   Temp 97.9 F (36.6 C) (Oral)   Resp 16   SpO2 100%  General:  Alert, oriented, calm, in no acute distress, looks pale Cardiovascular: RRR, no murmurs or rubs, no peripheral edema  Respiratory: clear to auscultation bilaterally, no wheezes, no crackles  Abdomen: soft, nontender, nondistended, normal bowel tones heard  Skin: dry, no rashes  Musculoskeletal: no joint effusions, normal range of motion  Psychiatric: appropriate affect, normal speech  Neurologic: extraocular muscles intact, clear speech, moving all extremities with intact sensorium         Labs on Admission:  Basic Metabolic Panel: Recent Labs  Lab 02/02/24 1046  NA 141  K 3.1*  CL 107  CO2 24  GLUCOSE 88  BUN 21  CREATININE 1.19*  CALCIUM  8.8*   Liver Function Tests: Recent Labs  Lab 02/02/24 1046  AST 135*  ALT 65*  ALKPHOS 72  BILITOT <0.2  PROT 6.4*  ALBUMIN  3.6   No results for input(s): LIPASE, AMYLASE in the last 168 hours. No results for input(s): AMMONIA in the last 168 hours. CBC: Recent Labs  Lab 02/02/24 1046  WBC 7.4  HGB 5.9*  HCT  19.8*  MCV 93.4  PLT 340   Cardiac Enzymes: No results for input(s): CKTOTAL, CKMB, CKMBINDEX, TROPONINI in the last 168 hours. BNP (last 3 results) No results for input(s): BNP in the last 8760 hours.  ProBNP (last 3 results) No results for input(s): PROBNP in the last 8760 hours.  CBG: No results for input(s): GLUCAP in the last 168 hours.  Radiological Exams on Admission: CT Angio Abd/Pel W and/or Wo Contrast Result Date: 02/02/2024 EXAM: CTA ABDOMEN AND PELVIS WITH AND WITHOUT CONTRAST 02/02/2024 02:08:48 PM  TECHNIQUE: CTA images of the abdomen and pelvis without and with intravenous contrast. Three-dimensional MIP/volume rendered formations were performed. Automated exposure control, iterative reconstruction, and/or weight based adjustment of the mA/kV was utilized to reduce the radiation dose to as low as reasonably achievable. COMPARISON: 01/01/2024 CLINICAL HISTORY: FINDINGS: VASCULATURE: AORTA: Aortic atherosclerotic calcifications. No acute finding. No abdominal aortic aneurysm. No dissection. CELIAC TRUNK: No acute finding. No occlusion or significant stenosis. SUPERIOR MESENTERIC ARTERY: No acute finding. No occlusion or significant stenosis. RENAL ARTERIES: No acute finding. No occlusion or significant stenosis. ILIAC ARTERIES: No acute finding. No occlusion or significant stenosis. LIVER: There are a few small scattered low attenuation foci within the liver which appears similar in multiplicity and distribution compared with the previous exam. The largest is in the dome measuring 7 mm, image 16/9. Unchanged from previous study. GALLBLADDER AND BILE DUCTS: Status post cholecystectomy. Common bile duct measures up to 9 mm proximally, image 92/23. No obstructing stone identified. In the absence of clinical signs or symptoms of biliary obstruction, these findings likely reflect post-cholecystectomy physiology. SPLEEN: The spleen is within normal limits in size and appearance. PANCREAS: Pancreas is normal in size and contour without focal lesion or ductal dilatation. ADRENAL GLANDS: Normal size and morphology bilaterally. No nodule, thickening, or hemorrhage. No periadrenal stranding. KIDNEYS, URETERS AND BLADDER: No stones in the kidneys or ureters. No hydronephrosis. No perinephric or periureteral stranding. Urinary bladder is unremarkable. GI AND BOWEL: Sigmoid colon demonstrates diverticulosis without evidence of diverticulitis. No bowel wall thickening, pericolonic stranding, abscess, or free air.  REPRODUCTIVE: Reproductive organs are unremarkable. PERITONEUM AND RETROPERITONEUM: No ascites or free air. LUNG BASE: Similar appearance of small scattered lower lobe lung nodules compared with 01/01/2024. These all measure less than 5 mm. Index nodule in the posteromedial left base measures 3 mm, image 52/7. LYMPH NODES: No lymphadenopathy. BONES AND SOFT TISSUES: Colon status post hardware fixation of the L4-L5 vertebra. Lumbar spondylosis. No acute soft tissue abnormality. IMPRESSION: 1. No acute findings. 2. No signs of active GI bleed. 3. Sigmoid diverticulosis without signs of acute diverticulitis. 4. Small, less than 5 mm lung nodules identified within the lung bases. In a patient who was at low risk, no further follow-up is indicated. If the patient is at increased risk a follow-up CT of the chest without contrast material in 12 months may be considered 5. Status post cholecystectomy. 6. Aortic atherosclerotic calcifications. 7. Moderate size hiatal hernia. Electronically signed by: Waddell Calk MD 02/02/2024 03:12 PM EDT RP Workstation: [id number]   Assessment/Plan [name] [name] is a 78 y.o. female with medical history significant for paroxysmal atrial fibrillation [name] , hypothyroidism, hypertension, hyperlipidemia, GERD and heart failure with preserved EF being admitted to the hospital with acute blood loss anemia.   Acute blood loss anemia-with abdominal discomfort, nausea, dark stools, hemoglobin has fallen significantly from 10.5 on 9/22 to now 5.9. -Inpatient admission -Monitor closely on progressive -IV PPI -Clear liquid diet -Transfused 2  unit PRBC -Monitor every 8 hours hemoglobin -[facility] consulted  Paroxysmal atrial fibrillation-continue Toprol -XL, holding Eliquis   Abnormal LFT-no prior history of this, unclear etiology. -Check Tylenol  level -Check acute hepatitis panel -Trend with morning labs, consider right upper quadrant ultrasound  Heart failure with  preserved EF-patient appears euvolemic, without evidence of acute exacerbation of heart failure -Continue home beta-blocker -Continue losartan   Hypothyroidism-Synthroid   Hypokalemia-p.o. replacement  Lumbar arthropathy-gabapentin , Flexeril   Hyperlipidemia-Crestor   DVT prophylaxis: SCDs only    Code Status: Full Code  Consults called: [facility]  Admission status: The appropriate patient status for this patient is INPATIENT. Inpatient status is judged to be reasonable and necessary in order to provide the required intensity of service to ensure the patient's safety. The patient's presenting symptoms, physical exam findings, and initial radiographic and laboratory data in the context of their chronic comorbidities is felt to place them at high risk for further clinical deterioration. Furthermore, it is not anticipated that the patient will be medically stable for discharge from the hospital within 2 midnights of admission.    I certify that at the point of admission it is my clinical judgment that the patient will require inpatient hospital care spanning beyond 2 midnights from the point of admission due to high intensity of service, high risk for further deterioration and high frequency of surveillance required  Time spent: 59 minutes  Zahraa Bhargava CHRISTELLA Gail MD [facility] Pager [phone]  If 7PM-7AM, please contact night-coverage [email or web address] Password [email or web address]  02/02/2024, 3:50 PM

## 2024-02-02 NOTE — ED Notes (Signed)
  Blood bank has 2 units of blood ready for this patient.  Informed Jennifer,RN>.

## 2024-02-02 NOTE — ED Notes (Signed)
 US  PIV placed, 20g 1.88 L AC

## 2024-02-02 NOTE — ED Triage Notes (Addendum)
 Pt reports Hgb of 6.3, blood in urine, and elevated liver function. Pt denies history of this. Was told to come by her PCP.

## 2024-02-02 NOTE — Consult Note (Addendum)
 Referring Provider: NDr. Zella, TRH Primary Care Physician:  Dorcus Lamar POUR, MD Primary Gastroenterologist:  Dr. Debrah  Reason for Consultation:  Anemia, GI bleed  HPI: [name] [name] is a 78 y.o. female with medical history significant for paroxysmal atrial fibrillation [name] , hypothyroidism, hypertension, hyperlipidemia, GERD, and heart failure with preserved EF being admitted to the hospital with acute blood loss anemia.  Patient states that starting about 3 to 4 weeks ago, she had some vague abdominal discomfort and bloating, and also started noticing that her stools were dark.  She had some vague nausea, but no vomiting.  No red rectal bleeding.  She did not make much of the dark stools, since she takes iron.  In any case, she has also been feeling weak for the last few days, finally went to see her PCP yesterday.  Lab work there apparently revealed significant anemia, she was called this morning and told to come to the ER for evaluation.  Though she has not seen anything in her urine, her PCP also told her that she has a significant amount of blood in her urine as well.  She does have a history of reflux and takes omeprazole  40 mg twice daily for years.  Feels overall her reflux is well-controlled.  No dysphagia, no recent unintentional weight loss.  Appetite is good.  No NSAID use, only uses Tylenol .  Her last dose of her Eliquis  was 10/23 PM.  She is getting 1 unit of packed red blood cells.  Hgb 5.9 grams down form 10.5 grams one month ago.  BUN normal.  LFTs elevated with AST 135, ALT 65, ALP 72, total bili <0.2.  She is s/p cholecystectomy.  CT angio of the abdomen and pelvis with contrast: IMPRESSION: 1. No acute findings. 2. No signs of active GI bleed. 3. Sigmoid diverticulosis without signs of acute diverticulitis. 4. Small, less than 5 mm lung nodules identified within the lung bases. In a patient who was at low risk, no further follow-up is indicated. If the  patient is at increased risk a follow-up CT of the chest without contrast material in 12 months may be considered 5. Status post cholecystectomy. 6. Aortic atherosclerotic calcifications. 7. Moderate size hiatal hernia.  Last colonoscopy 2014 and any endoscopy would have been prior to that.   Past Medical History:  Diagnosis Date   Anemia    Anxiety    Arthritis    Chronic back pain    cyst sitting on L4-5;slipped disc   Chronic heart failure with preserved ejection fraction (HFpEF) (HCC) 03/2019   EF improved from 25 and 30% back to 50 to 65% with GR 1 DD.   Depression    takes Cymbalta  daily   Diverticulosis    GERD (gastroesophageal reflux disease)    takes Omeprazole  daily   Headache(784.0)    rare   History of pancreatitis 03/27/2008   History of ulcer disease 08/06/2015   Hyperlipidemia    Hypertension    takes Cardura  nightly and Verapamil  daily   Hypothyroidism    Insomnia    takes Restoril  nightly   Malignant neoplasm of lower-inner quadrant of left breast in female, estrogen receptor positive (HCC) 05/20/2013   L Breast Cancer   Obesity (BMI 30-39.9) 06/11/2013   Paroxysmal atrial fibrillation (HCC): CHA2DS2-VASc Score - starting Eliquis  08/06/2015   This patients CHA2DS2-VASc Score and unadjusted Ischemic Stroke Rate (% per year) is equal to 4.8 % stroke rate/year from a score of 4  Above  score calculated as 1 point each if present [CHF, HTN, DM, Vascular=MI/PAD/Aortic Plaque, Age if 65-74, or Female]; 2 points each if present [Age > 75, or Stroke/TIA/TE]   Personal history of radiation therapy    2015   Pneumonia due to COVID-19 virus 04/05/2019   PONV (postoperative nausea and vomiting)    Pre-diabetes    RESOLVED: Nonischemic cardiomyopathy (HCC) 07/2015   Normal coronary arteries by cath. EF was 25-30% by echo. GR2 DD - likely related to LBBB in setting of A. fib => EF as of 03/2019: 60 to 65%, no RWMA.  GR 1 DD.   S/P radiation therapy 07/18/2013-08/28/2013    1) Left Breast / 50 Gy in 25 fractions/ 2) Left Breast Boost / 10 Gy in 5 fractions   Shingles     Past Surgical History:  Procedure Laterality Date   ABDOMINAL HYSTERECTOMY     ANTERIOR CERVICAL DECOMP/DISCECTOMY FUSION N/A 09/24/2020   Procedure: ANTERIOR CERVICAL DECOMPRESSION FUSION CERVICAL 5- CERVICAL 6, CERVICAL 6 -  CERVICAL 7 WITH INSTRUMENTATION AND ALLOGRAFT;  Surgeon: Beuford Anes, MD;  Location: [facility] OR;  Service: Orthopedics;  Laterality: N/A;   BACK SURGERY     BREAST LUMPECTOMY Left    1972 neg   BREAST LUMPECTOMY Left    2015 breast cancer   CARDIAC CATHETERIZATION N/A 08/10/2015   Procedure: Right/Left Heart Cath and Coronary Angiography;  Surgeon: Lonni JONETTA Cash, MD;  Location: [facility] INVASIVE CV LAB;  Service: Cardiovascular;: Nonobstructive CAD   CATARACT EXTRACTION BILATERAL W/ ANTERIOR VITRECTOMY Bilateral    CHOLECYSTECTOMY     COLONOSCOPY  2014   DUPUYTREN / PALMAR FASCIOTOMY Bilateral 2007   x 3 to left and once to the right    epidural injections     x 3   FOOT TENDON SURGERY Right ~ 11/2015   stepped in a hole and tore 2 tendons   JOINT REPLACEMENT     KNEE ARTHROSCOPY Bilateral    LUMBAR FUSION  10/2013   Dr. Beuford   TONSILLECTOMY     TOTAL KNEE ARTHROPLASTY Left 09/23/2016   TOTAL KNEE ARTHROPLASTY Left 09/23/2016   Procedure: TOTAL KNEE ARTHROPLASTY;  Surgeon: Yvone Rush, MD;  Location: [facility] OR;  Service: Orthopedics;  Laterality: Left;   TRANSTHORACIC ECHOCARDIOGRAM  08/07/2015   Severely reduced LVEF at 25-30% with diffuse HK. GR 2 DD. Ventricular dyssynergy secondary to LBBB. Small to moderate pericardial effusion but no hemodynamic compromise   TRANSTHORACIC ECHOCARDIOGRAM  10/2015    (EF up from 25-30%): - Left ventricle: Poor acoustic windows -  difficult to see.    EF estimated at 45-50% with inferoseptal and possible posterior hypokinesis. GR 1 DD. No pericardial effusion.   TRANSTHORACIC ECHOCARDIOGRAM  04/05/2019   EF 60 to 65%  without wall motion normality.  GR 1 DD.  Mild LA dilation.  Trivial MR.  Frequent PVCs noted.   TUBAL LIGATION     UPPER GI ENDOSCOPY      Prior to Admission medications   Medication Sig Start Date End Date Taking? Authorizing Provider  acetaminophen  (TYLENOL ) 500 MG tablet Take 500-1,000 mg by mouth every 8 (eight) hours as needed for mild pain (pain score 1-3) (or headaches).   Yes [provider]  apixaban  (ELIQUIS ) 5 MG TABS tablet Take 1 tablet (5 mg total) by mouth 2 (two) times daily. 07/11/23  Yes Anner Alm ORN, MD  Cholecalciferol  (VITAMIN D3) 250 MCG (10000 UT) TABS Take 5,000 Units by mouth daily.   Yes [provider]  Coenzyme Q10 (COQ10 PO) Take 1 capsule by mouth See admin instructions. Take 1 capsule by mouth once a day ON ALL DAYS CRESTOR /ROSUVASTATIN  is taken   Yes [provider]  cyclobenzaprine  (FLEXERIL ) 5 MG tablet Take 1 tablet (5 mg total) by mouth 3 (three) times daily as needed for muscle spasms. 03/20/23  Yes Wilkinson, Dana E, NP  DULoxetine  (CYMBALTA ) 60 MG capsule Take 60 mg by mouth in the morning.   Yes [provider]  furosemide  (LASIX ) 20 MG tablet Take 1 tablet (20 mg total) by mouth daily. 07/11/23  Yes Anner Alm ORN, MD  gabapentin  (NEURONTIN ) 300 MG capsule TAKE 1 CAPSULE BY MOUTH TWICE A DAY FOR CHRONIC PAIN Patient taking differently: Take 300 mg by mouth in the morning and at bedtime. 12/13/22  Yes Wilkinson, Dana E, NP  HYDROcodone -acetaminophen  (NORCO/VICODIN) 5-325 MG tablet Take 1 tablet by mouth every 6 (six) hours as needed. Patient taking differently: Take 1 tablet by mouth every 6 (six) hours as needed (for pain). 01/01/24  Yes Jerrol Agent, MD  hydrocortisone  cream 1 % Apply to affected area 2 times daily Patient taking differently: Apply 1 Application topically 2 (two) times daily as needed for itching. 12/25/22  Yes Young, Caron J, DO  hydrOXYzine  (ATARAX ) 50 MG tablet TAKE 1 TO 2 TABLETS 1 TO 2 HOURS  BEFORE BEDTIME AS NEEDED FOR SLEEP Patient taking differently: Take 0.5-1 mg by mouth at bedtime as needed (for sleep). 12/21/22  Yes Cranford, Tonya, NP  ipratropium (ATROVENT ) 0.03 % nasal spray Place 2 sprays into the nose every Monday, Tuesday, Wednesday, Thursday, and Friday. Patient taking differently: Place 2 sprays into the nose in the morning. 05/23/23 05/22/24 Yes Webb, Padonda B, FNP  ketoconazole  (NIZORAL ) 2 % shampoo USE AS DIRECTED TWICE A WEEK Patient taking differently: Apply 1 Application topically See admin instructions. Shampoo 2 times a week 10/29/20  Yes Wilkinson, Dana E, NP  levothyroxine  (SYNTHROID ) 50 MCG tablet 1 tab daily Please take your thyroid  medication greater than 30 min before breakfast, separated by at least 4 hours  from antacids, calcium , iron, and multivitamins. Patient taking differently: Take 50 mcg by mouth daily before breakfast. Take 50 mcg by mouth at least 30 minutes before breakfast- separated by at least 4 hours  from antacids, calcium , iron, and multivitamins 02/21/23  Yes Wilkinson, Dana E, NP  losartan  (COZAAR ) 50 MG tablet TAKE 1 TABLET BY MOUTH EVERY DAY 06/19/23  Yes Anner Alm ORN, MD  meclizine  (ANTIVERT ) 25 MG tablet 1/2-1 pill up to 3 times daily for motion sickness/dizziness. Caution, may cause drowsiness. Patient taking differently: Take 12.5-25 mg by mouth 3 (three) times daily as needed for dizziness (or motion sickness- causes drowsiness). 05/24/19  Yes Jeanine Knee, NP  metoprolol  succinate (TOPROL -XL) 25 MG 24 hr tablet TAKE 1 TABLET (25 MG TOTAL) BY MOUTH DAILY. 06/19/23  Yes Anner Alm ORN, MD  naproxen  (NAPROSYN ) 500 MG tablet Take 1 tablet (500 mg total) by mouth 2 (two) times daily. Patient taking differently: Take 500 mg by mouth 2 (two) times daily as needed for mild pain (pain score 1-3) (TAKE WITH FOOD). 01/01/24  Yes Jerrol Agent, MD  nystatin  cream (MYCOSTATIN ) APPLY TO AFFECTED AREA TWICE A DAY Patient taking differently:  Apply 1 Application topically 2 (two) times daily as needed (for Candidiasis of the skin). 09/04/22  Yes Tonita Fallow, MD  omeprazole  (PRILOSEC) 40 MG capsule TAKE 1 CAPSULE 2 X /DAY TO PREVENT HEARTBURN & INDIGESTION  Patient taking differently: Take 40 mg by mouth in the morning and at bedtime. 09/04/22  Yes Tonita Fallow, MD  ondansetron  (ZOFRAN ) 8 MG tablet Take 1/2 to 1 3 x /day before meals as needed for Nausea Patient taking differently: Take 4-8 mg by mouth See admin instructions. Take 4-8 mg by mouth three times a day before meals as needed for nausea 03/20/23  Yes Wilkinson, Dana E, NP  rosuvastatin  (CRESTOR ) 40 MG tablet TAKE 1 TABLET BY MOUTH EVERY DAY EXCEPT TAKE 2 TABLETS BY MOUTH ON MONDAYS Patient taking differently: Take 40 mg by mouth at bedtime. 07/11/23  Yes Anner Alm ORN, MD  buPROPion  (WELLBUTRIN  XL) 150 MG 24 hr tablet TAKE 1 TABLET BY MOUTH EVERY DAY IN THE MORNING Patient not taking: Reported on 02/02/2024 10/11/18   Craig Alan SAUNDERS, PA-C  [facility] 12 HOUR NASAL DECONGESTANT 120 MG 12 hr tablet TAKE 1 TABLET BY MOUTH TWICE DAILY (EVERY 12 HOURS) FOR HEAD/CHEST CONGESTION & EAR FLUID Patient not taking: Reported on 02/02/2024 06/01/21   Wilkinson, Dana E, NP  DULoxetine  (CYMBALTA ) 30 MG capsule TAKE 1 CAPSULE BY MOUTH 3 TIMES A DAY Patient not taking: Reported on 02/02/2024 10/26/21   Wilkinson, Dana E, NP  lidocaine  (LIDODERM ) 5 % Place 1 patch onto the skin daily. Remove & Discard patch within 12 hours or as directed by MD Patient not taking: Reported on 02/02/2024 01/01/24   Jerrol Agent, MD  potassium chloride  SA (KLOR-CON  M20) 20 MEQ tablet Take 1 tablet (20 mEq total) by mouth daily. Patient not taking: Reported on 02/02/2024 07/11/23   Anner Alm ORN, MD  Semaglutide , 2 MG/DOSE, (OZEMPIC , 2 MG/DOSE,) 8 MG/3ML SOPN Inject 2 mg into Skin every 7 days for Diabetes  (Dx: e11.29 ) Patient not taking: Reported on 02/02/2024 09/04/22   Tonita Fallow, MD  valACYclovir   (VALTREX ) 1000 MG tablet Take 1 tablet (1,000 mg total) by mouth 3 (three) times daily. Patient not taking: Reported on 02/02/2024 01/01/24   Jerrol Agent, MD    Current Facility-Administered Medications  Medication Dose Route Frequency Provider Last Rate Last Admin   acetaminophen  (TYLENOL ) tablet 650 mg  650 mg Oral Q6H PRN Zella, Mir M, MD       Or   acetaminophen  (TYLENOL ) suppository 650 mg  650 mg Rectal Q6H PRN Zella, Mir M, MD       albuterol  (PROVENTIL ) (2.5 MG/3ML) 0.083% nebulizer solution 2.5 mg  2.5 mg Nebulization Q2H PRN Zella, Mir M, MD       cyclobenzaprine  (FLEXERIL ) tablet 5 mg  5 mg Oral TID PRN Zella, Mir M, MD       gabapentin  (NEURONTIN ) capsule 300 mg  300 mg Oral BID Zella, Mir M, MD   300 mg at 02/02/24 1559   HYDROcodone -acetaminophen  (NORCO/VICODIN) 5-325 MG per tablet 1 tablet  1 tablet Oral Q6H PRN Zella, Mir M, MD       ipratropium-albuterol  (DUONEB) 0.5-2.5 (3) MG/3ML nebulizer solution 3 mL  3 mL Nebulization Once Cranford, Tonya, NP       [START ON 02/03/2024] levothyroxine  (SYNTHROID ) tablet 50 mcg  50 mcg Oral Q0600 Zella, Mir M, MD       losartan  (COZAAR ) tablet 50 mg  50 mg Oral Daily Ikramullah, Mir M, MD   50 mg at 02/02/24 1558   meclizine  (ANTIVERT ) tablet 12.5-25 mg  12.5-25 mg Oral TID PRN Zella Katha HERO, MD       metoprolol  succinate (TOPROL -XL) 24 hr tablet 25 mg  25  mg Oral Daily Ikramullah, Mir M, MD   25 mg at 02/02/24 1558   ondansetron  (ZOFRAN ) tablet 4 mg  4 mg Oral Q6H PRN Zella Katha HERO, MD       Or   ondansetron  (ZOFRAN ) injection 4 mg  4 mg Intravenous Q6H PRN Zella, Mir M, MD       pantoprazole  (PROTONIX ) injection 40 mg  40 mg Intravenous Q24H Zella, Mir M, MD   40 mg at 02/02/24 1559   rosuvastatin  (CRESTOR ) tablet 40 mg  40 mg Oral QHS Zella, Mir M, MD       traZODone  (DESYREL ) tablet 25 mg  25 mg Oral QHS PRN Zella Katha HERO, MD       Current Outpatient Medications   Medication Sig Dispense Refill   acetaminophen  (TYLENOL ) 500 MG tablet Take 500-1,000 mg by mouth every 8 (eight) hours as needed for mild pain (pain score 1-3) (or headaches).     apixaban  (ELIQUIS ) 5 MG TABS tablet Take 1 tablet (5 mg total) by mouth 2 (two) times daily. 180 tablet 2   Cholecalciferol  (VITAMIN D3) 250 MCG (10000 UT) TABS Take 5,000 Units by mouth daily.     Coenzyme Q10 (COQ10 PO) Take 1 capsule by mouth See admin instructions. Take 1 capsule by mouth once a day ON ALL DAYS CRESTOR /ROSUVASTATIN  is taken     cyclobenzaprine  (FLEXERIL ) 5 MG tablet Take 1 tablet (5 mg total) by mouth 3 (three) times daily as needed for muscle spasms. 60 tablet 0   DULoxetine  (CYMBALTA ) 60 MG capsule Take 60 mg by mouth in the morning.     furosemide  (LASIX ) 20 MG tablet Take 1 tablet (20 mg total) by mouth daily. 90 tablet 3   gabapentin  (NEURONTIN ) 300 MG capsule TAKE 1 CAPSULE BY MOUTH TWICE A DAY FOR CHRONIC PAIN (Patient taking differently: Take 300 mg by mouth in the morning and at bedtime.) 180 capsule 1   HYDROcodone -acetaminophen  (NORCO/VICODIN) 5-325 MG tablet Take 1 tablet by mouth every 6 (six) hours as needed. (Patient taking differently: Take 1 tablet by mouth every 6 (six) hours as needed (for pain).) 10 tablet 0   hydrocortisone  cream 1 % Apply to affected area 2 times daily (Patient taking differently: Apply 1 Application topically 2 (two) times daily as needed for itching.) 15 g 0   hydrOXYzine  (ATARAX ) 50 MG tablet TAKE 1 TO 2 TABLETS 1 TO 2 HOURS BEFORE BEDTIME AS NEEDED FOR SLEEP (Patient taking differently: Take 0.5-1 mg by mouth at bedtime as needed (for sleep).) 180 tablet 2   ipratropium (ATROVENT ) 0.03 % nasal spray Place 2 sprays into the nose every Monday, Tuesday, Wednesday, Thursday, and Friday. (Patient taking differently: Place 2 sprays into the nose in the morning.) 30 mL 0   ketoconazole  (NIZORAL ) 2 % shampoo USE AS DIRECTED TWICE A WEEK (Patient taking differently:  Apply 1 Application topically See admin instructions. Shampoo 2 times a week) 120 mL 0   levothyroxine  (SYNTHROID ) 50 MCG tablet 1 tab daily Please take your thyroid  medication greater than 30 min before breakfast, separated by at least 4 hours  from antacids, calcium , iron, and multivitamins. (Patient taking differently: Take 50 mcg by mouth daily before breakfast. Take 50 mcg by mouth at least 30 minutes before breakfast- separated by at least 4 hours  from antacids, calcium , iron, and multivitamins) 90 tablet 3   losartan  (COZAAR ) 50 MG tablet TAKE 1 TABLET BY MOUTH EVERY DAY 90 tablet 3   meclizine  (ANTIVERT ) 25  MG tablet 1/2-1 pill up to 3 times daily for motion sickness/dizziness. Caution, may cause drowsiness. (Patient taking differently: Take 12.5-25 mg by mouth 3 (three) times daily as needed for dizziness (or motion sickness- causes drowsiness).) 60 tablet 0   metoprolol  succinate (TOPROL -XL) 25 MG 24 hr tablet TAKE 1 TABLET (25 MG TOTAL) BY MOUTH DAILY. 90 tablet 3   naproxen  (NAPROSYN ) 500 MG tablet Take 1 tablet (500 mg total) by mouth 2 (two) times daily. (Patient taking differently: Take 500 mg by mouth 2 (two) times daily as needed for mild pain (pain score 1-3) (TAKE WITH FOOD).) 30 tablet 0   nystatin  cream (MYCOSTATIN ) APPLY TO AFFECTED AREA TWICE A DAY (Patient taking differently: Apply 1 Application topically 2 (two) times daily as needed (for Candidiasis of the skin).) 90 g 3   omeprazole  (PRILOSEC) 40 MG capsule TAKE 1 CAPSULE 2 X /DAY TO PREVENT HEARTBURN & INDIGESTION (Patient taking differently: Take 40 mg by mouth in the morning and at bedtime.) 180 capsule 3   ondansetron  (ZOFRAN ) 8 MG tablet Take 1/2 to 1 3 x /day before meals as needed for Nausea (Patient taking differently: Take 4-8 mg by mouth See admin instructions. Take 4-8 mg by mouth three times a day before meals as needed for nausea) 90 tablet 0   rosuvastatin  (CRESTOR ) 40 MG tablet TAKE 1 TABLET BY MOUTH EVERY DAY  EXCEPT TAKE 2 TABLETS BY MOUTH ON MONDAYS (Patient taking differently: Take 40 mg by mouth at bedtime.) 102 tablet 2   buPROPion  (WELLBUTRIN  XL) 150 MG 24 hr tablet TAKE 1 TABLET BY MOUTH EVERY DAY IN THE MORNING (Patient not taking: Reported on 02/02/2024) 90 tablet 1   [facility] 12 HOUR NASAL DECONGESTANT 120 MG 12 hr tablet TAKE 1 TABLET BY MOUTH TWICE DAILY (EVERY 12 HOURS) FOR HEAD/CHEST CONGESTION & EAR FLUID (Patient not taking: Reported on 02/02/2024) 60 tablet 3   DULoxetine  (CYMBALTA ) 30 MG capsule TAKE 1 CAPSULE BY MOUTH 3 TIMES A DAY (Patient not taking: Reported on 02/02/2024) 270 capsule 3   lidocaine  (LIDODERM ) 5 % Place 1 patch onto the skin daily. Remove & Discard patch within 12 hours or as directed by MD (Patient not taking: Reported on 02/02/2024) 30 patch 0   potassium chloride  SA (KLOR-CON  M20) 20 MEQ tablet Take 1 tablet (20 mEq total) by mouth daily. (Patient not taking: Reported on 02/02/2024) 90 tablet 3   Semaglutide , 2 MG/DOSE, (OZEMPIC , 2 MG/DOSE,) 8 MG/3ML SOPN Inject 2 mg into Skin every 7 days for Diabetes  (Dx: e11.29 ) (Patient not taking: Reported on 02/02/2024) 9 mL 1   valACYclovir  (VALTREX ) 1000 MG tablet Take 1 tablet (1,000 mg total) by mouth 3 (three) times daily. (Patient not taking: Reported on 02/02/2024) 21 tablet 0    Allergies as of 02/02/2024 - Review Complete 02/02/2024  Allergen Reaction Noted   Morphine sulfate Palpitations 03/13/2008   Requip [ropinirole hcl] Shortness Of Breath and Nausea And Vomiting 03/27/2013   Amoxicillin Dermatitis and Rash 03/27/2013   Codeine Other (See Comments) 03/27/2013   Ezetimibe Other (See Comments) 03/27/2013   Minocycline Hives 06/06/2008   Spironolactone  Rash and Dermatitis 10/15/2021   Sulfa antibiotics Other (See Comments) 03/27/2013   Tetracyclines & related Hives 03/28/2013   Zestril [lisinopril] Cough 03/27/2013    Family History  Problem Relation Age of Onset   Heart attack Mother    Stroke Mother     COPD Father    Hypertension Father    Hyperlipidemia Sister  Breast cancer Sister        53s   Colon polyps Neg Hx    Esophageal cancer Neg Hx    Rectal cancer Neg Hx    Stomach cancer Neg Hx     Social History   Socioeconomic History   Marital status: Widowed    Spouse name: Not on file   Number of children: Not on file   Years of education: Not on file   Highest education level: Not on file  Occupational History   Occupation: Engineer, petroleum    Employer: [employer]    Comment: Works 5 hours a day.  Very busy  Tobacco Use   Smoking status: Never   Smokeless tobacco: Never  Vaping Use   Vaping status: Never Used  Substance and Sexual Activity   Alcohol use: Yes    Comment: wine occasionally-rarely   Drug use: No   Sexual activity: Not Currently    Birth control/protection: Surgical  Other Topics Concern   Not on file  Social History Narrative   Not on file   Social Drivers of Health   Financial Resource Strain: Low Risk  (11/29/2023)   Received from [facility]   Overall Financial Resource Strain (CARDIA)    How hard is it for you to pay for the very basics like food, housing, medical care, and heating?: Not hard at all  Food Insecurity: No Food Insecurity (11/29/2023)   Received from [facility]   Hunger Vital Sign    Within the past 12 months, you worried that your food would run out before you got the money to buy more.: Never true    Within the past 12 months, the food you bought just didn't last and you didn't have money to get more.: Never true  Transportation Needs: No Transportation Needs (11/29/2023)   Received from [facility] - Transportation    In the past 12 months, has lack of transportation kept you from medical appointments or from getting medications?: No    In the past 12 months, has lack of transportation kept you from meetings, work, or from getting things needed for daily living?: No  Physical Activity: Inactive  (11/29/2023)   Received from [facility]   Exercise Vital Sign    On average, how many days per week do you engage in moderate to strenuous exercise (like a brisk walk)?: 0 days    Minutes of Exercise per Session: Not on file  Stress: No Stress Concern Present (11/29/2023)   Received from [facility] - Occupational Stress Questionnaire    Do you feel stress - tense, restless, nervous, or anxious, or unable to sleep at night because your mind is troubled all the time - these days?: Not at all  Social Connections: Moderately Integrated (11/29/2023)   Received from [facility]   Social Network    How would you rate your social network (family, work, friends)?: Adequate participation with social networks  Intimate Partner Violence: Not At Risk (11/29/2023)   Received from [facility]   HITS    Over the last 12 months how often did your partner physically hurt you?: Never    Over the last 12 months how often did your partner insult you or talk down to you?: Never    Over the last 12 months how often did your partner threaten you with physical harm?: Never    Over the last 12 months how  often did your partner scream or curse at you?: Never    Review of Systems: ROS is O/W negative except as mentioned in HPI.  Physical Exam: Vital signs in last 24 hours: Temp:  [97.9 F (36.6 C)-98.3 F (36.8 C)] 97.9 F (36.6 C) (10/24 1427) Pulse Rate:  [70-89] 72 (10/24 1557) Resp:  [16-17] 16 (10/24 1557) BP: (133-151)/(61-77) 151/77 (10/24 1557) SpO2:  [92 %-100 %] 100 % (10/24 1557)   General:  Alert, Well-developed, well-nourished, pleasant and cooperative in NAD Head:  Normocephalic and atraumatic. Eyes:  Sclera clear, no icterus.  Conjunctiva pink. Ears:  Normal auditory acuity. Mouth:  No deformity or lesions.   Lungs:  Clear throughout to auscultation.  No wheezes, crackles, or rhonchi.  Heart:  Regular rate and rhythm; no murmurs, clicks,  rubs, or gallops. Abdomen:  Soft, non-distended.  BS present.  Minimal epigastric TTP. Msk:  Symmetrical without gross deformities. Pulses:  Normal pulses noted. Extremities:  Without clubbing or edema. Neurologic:  Alert and oriented x 4;  grossly normal neurologically. Skin:  Intact without significant lesions or rashes. Psych:  Alert and cooperative. Normal mood and affect.  Lab Results: Recent Labs    02/02/24 1046  WBC 7.4  HGB 5.9*  HCT 19.8*  PLT 340   BMET Recent Labs    02/02/24 1046  NA 141  K 3.1*  CL 107  CO2 24  GLUCOSE 88  BUN 21  CREATININE 1.19*  CALCIUM  8.8*   LFT Recent Labs    02/02/24 1046  PROT 6.4*  ALBUMIN  3.6  AST 135*  ALT 65*  ALKPHOS 72  BILITOT <0.2   Studies/Results: CT Angio Abd/Pel W and/or Wo Contrast Result Date: 02/02/2024 EXAM: CTA ABDOMEN AND PELVIS WITH AND WITHOUT CONTRAST 02/02/2024 02:08:48 PM TECHNIQUE: CTA images of the abdomen and pelvis without and with intravenous contrast. Three-dimensional MIP/volume rendered formations were performed. Automated exposure control, iterative reconstruction, and/or weight based adjustment of the mA/kV was utilized to reduce the radiation dose to as low as reasonably achievable. COMPARISON: 01/01/2024 CLINICAL HISTORY: FINDINGS: VASCULATURE: AORTA: Aortic atherosclerotic calcifications. No acute finding. No abdominal aortic aneurysm. No dissection. CELIAC TRUNK: No acute finding. No occlusion or significant stenosis. SUPERIOR MESENTERIC ARTERY: No acute finding. No occlusion or significant stenosis. RENAL ARTERIES: No acute finding. No occlusion or significant stenosis. ILIAC ARTERIES: No acute finding. No occlusion or significant stenosis. LIVER: There are a few small scattered low attenuation foci within the liver which appears similar in multiplicity and distribution compared with the previous exam. The largest is in the dome measuring 7 mm, image 16/9. Unchanged from previous study.  GALLBLADDER AND BILE DUCTS: Status post cholecystectomy. Common bile duct measures up to 9 mm proximally, image 92/23. No obstructing stone identified. In the absence of clinical signs or symptoms of biliary obstruction, these findings likely reflect post-cholecystectomy physiology. SPLEEN: The spleen is within normal limits in size and appearance. PANCREAS: Pancreas is normal in size and contour without focal lesion or ductal dilatation. ADRENAL GLANDS: Normal size and morphology bilaterally. No nodule, thickening, or hemorrhage. No periadrenal stranding. KIDNEYS, URETERS AND BLADDER: No stones in the kidneys or ureters. No hydronephrosis. No perinephric or periureteral stranding. Urinary bladder is unremarkable. GI AND BOWEL: Sigmoid colon demonstrates diverticulosis without evidence of diverticulitis. No bowel wall thickening, pericolonic stranding, abscess, or free air. REPRODUCTIVE: Reproductive organs are unremarkable. PERITONEUM AND RETROPERITONEUM: No ascites or free air. LUNG BASE: Similar appearance of small scattered lower lobe lung nodules compared with 01/01/2024.  These all measure less than 5 mm. Index nodule in the posteromedial left base measures 3 mm, image 52/7. LYMPH NODES: No lymphadenopathy. BONES AND SOFT TISSUES: Colon status post hardware fixation of the L4-L5 vertebra. Lumbar spondylosis. No acute soft tissue abnormality. IMPRESSION: 1. No acute findings. 2. No signs of active GI bleed. 3. Sigmoid diverticulosis without signs of acute diverticulitis. 4. Small, less than 5 mm lung nodules identified within the lung bases. In a patient who was at low risk, no further follow-up is indicated. If the patient is at increased risk a follow-up CT of the chest without contrast material in 12 months may be considered 5. Status post cholecystectomy. 6. Aortic atherosclerotic calcifications. 7. Moderate size hiatal hernia. Electronically signed by: Waddell Calk MD 02/02/2024 03:12 PM EDT RP  Workstation: [id number]   IMPRESSION:  *Anemia with black stools suspicious for UGI bleed, subacute.  Hgb 5.9 grams down form 10.5 grams one month ago.  BUN normal.  Was having very dark/black stools for the past 3 weeks or so.  No NSAIDs.  CT angio negative for bleeding source.  Getting one unit of PRBCs. *LFTs elevated with AST 135, ALT 65, ALP 72, total bili <0.2.  She is s/p cholecystectomy.  CT scan shows 9 mm common bile duct but no obstructing stone.  Says that the bile duct dilation could be due to postcholecystectomy physiology.  Hepatitis panel pending. *Atrial fibrillation [name]  with last dose 10/23 PM  PLAN: -Will allow clear liquids then NPO after midnight for EGD 10/25. -Agree with transfusion monitor Hgb. -Pantoprazole  40 mg IV BID.   Harlene BIRCH. Zehr  02/02/2024, 4:01 PM    Attending Physician's Attestation   I have taken an interval history, reviewed the chart and examined the patient.   This is a patient that the GI service is asked to evaluate in the setting of chronic anticoagulation with progressive acute blood loss anemia in the setting of dark stools.  She has a history of atrial fibrillation [name] .  Over the course of the last month she has had some intermittent abdominal discomfort and bloating with noticing her bowels becoming darker.  She had not been taking any Pepto-Bismol.  She does take iron chronically but had not noticed her stools being that color previously.  She saw her primary care provider yesterday in the setting of progressive fatigue and as a result of the blood work was told to come to the emergency department.  She has longer standing GERD symptoms for which she has been on PPI and denies overt dysphagia symptoms at this time.  She underwent a CT abdomen and pelvis which showed no signs of active GI bleeding and found diverticulosis without diverticulitis.  Her hemogram was 5.9 with some elevation in her transferase is in a slightly dilated bile  duct on CT (postcholecystectomy at this time).  She is not having abdominal pain currently.  Examination shows a comfortable appearing patient without hemodynamic compromise.  She is breathing normally without wheezing and has no abdominal pain on examination.  I think she would benefit from both an upper and lower endoscopy, though upper endoscopy seems more likely in the setting of her more recent symptoms to find an etiology.  She is hesitant about colonoscopy.  Will plan upper endoscopy for tomorrow, pending anesthesia availability.  If EGD is negative then colonoscopy +/- video capsule endoscopy will be recommended.  The risks of an EUS including intestinal perforation, bleeding, infection, aspiration, and medication effects were discussed  as was the possibility it may not give a definitive diagnosis if a biopsy is performed.  When a biopsy of the pancreas is done as part of the EUS, there is an additional risk of pancreatitis at the rate of about 1-2%.  It was explained that procedure related pancreatitis is typically mild, although it can be severe and even life threatening, which is why we do not perform random pancreatic biopsies and only biopsy a lesion/area we feel is concerning enough to warrant the risk.  All patient questions were answered to the best of my ability, and the patient agrees to the aforementioned plan of action with follow-up as indicated.  In regards to her liver biochemical testing abnormality, would see how she does and how these are affected after she is transfused.  If LFTs continue to trend upwards, more significant liver serological workup as well as MRCP will be considered.  I have formally reviewed her imaging as well as her prior endoscopic evaluation.  I agree with the Advanced Practitioner's note, impression, and recommendations with updates and my documentation as noted above.  The majority of the medical decision making/process, formulation of the impression/plan of action  for the patient were performed by me with substantive portion of this encounter (>50% time spent including complete performance of at least one of the key components of MDM, History, and/or Exam).   Aloha Finner, MD [facility] Office # [phone]

## 2024-02-02 NOTE — ED Provider Notes (Signed)
  EMERGENCY DEPARTMENT AT [facility] Provider Note   CSN: [id number] Arrival date & time: 02/02/24  9045     History Chief Complaint  Patient presents with   Abnormal Labs    HPI: [name] [name] is a 78 y.o. female with history pertinent for hypertension, hyperlipidemia, A-fib [name] , HFpEF, nonischemic cardiomyopathy, elevated BMI, CKD who presents complaining of anemia.  Patient arrived via POV accompanied by her grandson, [name] [name].  History provided by patient.  No interpreter required during this encounter.  Patient reports that she has had intermittent back pain for approximately a month as well as malaise and fatigue.  Reports that she was seen about a month ago for this at [facility] and cleared.  Outpatient management.  Reports that she has continued to feel poorly since then, and one of her coworkers thought her symptoms could be related to a UTI (patient denies any urinary symptoms) and encouraged patient to follow-up with her PCP.  Patient reports that she went to her PCP yesterday had routine labs drawn, and received a call today that her hemoglobin was diminished and that she should come to the emergency department.  Patient denies fever, chills, chest pain, shortness of breath, nausea, vomiting, diarrhea.  Reports that her back pain has been in variable location, denies any recent trauma, falls.  Patient does note that she has a history of iron deficiency anemia, has been on iron supplementation for some time, has had approximately 1 month of dark stools which she attributed to her iron supplementation.  Patient's recorded medical, surgical, social, medication list and allergies were reviewed in the Snapshot window as part of the initial history.   Prior to Admission medications   Medication Sig Start Date End Date Taking? Authorizing Provider  acetaminophen  (TYLENOL ) 500 MG tablet Take 500-1,000 mg by mouth every 8 (eight) hours as needed for mild pain  (pain score 1-3) (or headaches).   Yes [provider]  apixaban  (ELIQUIS ) 5 MG TABS tablet Take 1 tablet (5 mg total) by mouth 2 (two) times daily. 07/11/23  Yes Anner Alm ORN, MD  Cholecalciferol  (VITAMIN D3) 250 MCG (10000 UT) TABS Take 5,000 Units by mouth daily.   Yes [provider]  Coenzyme Q10 (COQ10 PO) Take 1 capsule by mouth See admin instructions. Take 1 capsule by mouth once a day ON ALL DAYS CRESTOR /ROSUVASTATIN  is taken   Yes [provider]  cyclobenzaprine  (FLEXERIL ) 5 MG tablet Take 1 tablet (5 mg total) by mouth 3 (three) times daily as needed for muscle spasms. 03/20/23  Yes Wilkinson, Dana E, NP  DULoxetine  (CYMBALTA ) 60 MG capsule Take 60 mg by mouth in the morning.   Yes [provider]  furosemide  (LASIX ) 20 MG tablet Take 1 tablet (20 mg total) by mouth daily. 07/11/23  Yes Anner Alm ORN, MD  gabapentin  (NEURONTIN ) 300 MG capsule TAKE 1 CAPSULE BY MOUTH TWICE A DAY FOR CHRONIC PAIN Patient taking differently: Take 300 mg by mouth in the morning and at bedtime. 12/13/22  Yes Wilkinson, Dana E, NP  HYDROcodone -acetaminophen  (NORCO/VICODIN) 5-325 MG tablet Take 1 tablet by mouth every 6 (six) hours as needed. Patient taking differently: Take 1 tablet by mouth every 6 (six) hours as needed (for pain). 01/01/24  Yes Jerrol Agent, MD  hydrocortisone  cream 1 % Apply to affected area 2 times daily Patient taking differently: Apply 1 Application topically 2 (two) times daily as needed for itching. 12/25/22  Yes Neysa Caron PARAS, DO  hydrOXYzine  (ATARAX ) 50 MG tablet TAKE 1 TO 2 TABLETS 1 TO 2 HOURS BEFORE BEDTIME AS NEEDED FOR SLEEP Patient taking differently: Take 0.5-1 mg by mouth at bedtime as needed (for sleep). 12/21/22  Yes Cranford, Tonya, NP  ipratropium (ATROVENT ) 0.03 % nasal spray Place 2 sprays into the nose every Monday, Tuesday, Wednesday, Thursday, and Friday. Patient taking differently: Place 2 sprays into the nose in the morning.  05/23/23 05/22/24 Yes Webb, Padonda B, FNP  ketoconazole  (NIZORAL ) 2 % shampoo USE AS DIRECTED TWICE A WEEK Patient taking differently: Apply 1 Application topically See admin instructions. Shampoo 2 times a week 10/29/20  Yes Wilkinson, Dana E, NP  levothyroxine  (SYNTHROID ) 50 MCG tablet 1 tab daily Please take your thyroid  medication greater than 30 min before breakfast, separated by at least 4 hours  from antacids, calcium , iron, and multivitamins. Patient taking differently: Take 50 mcg by mouth daily before breakfast. Take 50 mcg by mouth at least 30 minutes before breakfast- separated by at least 4 hours  from antacids, calcium , iron, and multivitamins 02/21/23  Yes Wilkinson, Dana E, NP  losartan  (COZAAR ) 50 MG tablet TAKE 1 TABLET BY MOUTH EVERY DAY 06/19/23  Yes Anner Alm ORN, MD  meclizine  (ANTIVERT ) 25 MG tablet 1/2-1 pill up to 3 times daily for motion sickness/dizziness. Caution, may cause drowsiness. Patient taking differently: Take 12.5-25 mg by mouth 3 (three) times daily as needed for dizziness (or motion sickness- causes drowsiness). 05/24/19  Yes Jeanine Knee, NP  metoprolol  succinate (TOPROL -XL) 25 MG 24 hr tablet TAKE 1 TABLET (25 MG TOTAL) BY MOUTH DAILY. 06/19/23  Yes Anner Alm ORN, MD  naproxen  (NAPROSYN ) 500 MG tablet Take 1 tablet (500 mg total) by mouth 2 (two) times daily. Patient taking differently: Take 500 mg by mouth 2 (two) times daily as needed for mild pain (pain score 1-3) (TAKE WITH FOOD). 01/01/24  Yes Jerrol Agent, MD  nystatin  cream (MYCOSTATIN ) APPLY TO AFFECTED AREA TWICE A DAY Patient taking differently: Apply 1 Application topically 2 (two) times daily as needed (for Candidiasis of the skin). 09/04/22  Yes Tonita Fallow, MD  omeprazole  (PRILOSEC) 40 MG capsule TAKE 1 CAPSULE 2 X /DAY TO PREVENT HEARTBURN & INDIGESTION Patient taking differently: Take 40 mg by mouth in the morning and at bedtime. 09/04/22  Yes Tonita Fallow, MD  ondansetron  (ZOFRAN ) 8  MG tablet Take 1/2 to 1 3 x /day before meals as needed for Nausea Patient taking differently: Take 4-8 mg by mouth See admin instructions. Take 4-8 mg by mouth three times a day before meals as needed for nausea 03/20/23  Yes Wilkinson, Dana E, NP  rosuvastatin  (CRESTOR ) 40 MG tablet TAKE 1 TABLET BY MOUTH EVERY DAY EXCEPT TAKE 2 TABLETS BY MOUTH ON MONDAYS Patient taking differently: Take 40 mg by mouth at bedtime. 07/11/23  Yes Anner Alm ORN, MD  buPROPion  (WELLBUTRIN  XL) 150 MG 24 hr tablet TAKE 1 TABLET BY MOUTH EVERY DAY IN THE MORNING Patient not taking: Reported on 02/02/2024 10/11/18   Craig Alan SAUNDERS, PA-C  [facility] 12 HOUR NASAL DECONGESTANT 120 MG 12 hr tablet TAKE 1 TABLET BY MOUTH TWICE DAILY (EVERY 12 HOURS) FOR HEAD/CHEST CONGESTION & EAR FLUID Patient not taking: Reported on 02/02/2024 06/01/21   Wilkinson, Dana E, NP  DULoxetine  (CYMBALTA ) 30 MG capsule TAKE 1 CAPSULE BY MOUTH 3 TIMES A DAY Patient not taking: Reported on 02/02/2024 10/26/21   Wilkinson, Dana E, NP  lidocaine  (LIDODERM ) 5 % Place 1 patch onto  the skin daily. Remove & Discard patch within 12 hours or as directed by MD Patient not taking: Reported on 02/02/2024 01/01/24   Jerrol Agent, MD  potassium chloride  SA (KLOR-CON  M20) 20 MEQ tablet Take 1 tablet (20 mEq total) by mouth daily. Patient not taking: Reported on 02/02/2024 07/11/23   Anner Alm ORN, MD  Semaglutide , 2 MG/DOSE, (OZEMPIC , 2 MG/DOSE,) 8 MG/3ML SOPN Inject 2 mg into Skin every 7 days for Diabetes  (Dx: e11.29 ) Patient not taking: Reported on 02/02/2024 09/04/22   Tonita Fallow, MD  valACYclovir  (VALTREX ) 1000 MG tablet Take 1 tablet (1,000 mg total) by mouth 3 (three) times daily. Patient not taking: Reported on 02/02/2024 01/01/24   Jerrol Agent, MD     Allergies: Morphine sulfate, Requip [ropinirole hcl], Amoxicillin, Codeine, Ezetimibe, Minocycline, Spironolactone , Sulfa antibiotics, Tetracyclines & related, and Zestril [lisinopril]   Review of  Systems   ROS as per HPI  Physical Exam Updated Vital Signs BP (!) 151/77   Pulse 72   Temp 97.9 F (36.6 C) (Oral)   Resp 16   SpO2 100%  Physical Exam Vitals and nursing note reviewed.  Constitutional:      General: She is not in acute distress.    Appearance: She is well-developed.  HENT:     Head: Normocephalic and atraumatic.  Eyes:     Conjunctiva/sclera: Conjunctivae normal.  Cardiovascular:     Rate and Rhythm: Normal rate and regular rhythm.     Heart sounds: No murmur heard. Pulmonary:     Effort: Pulmonary effort is normal. No respiratory distress.     Breath sounds: Normal breath sounds.  Abdominal:     Palpations: Abdomen is soft.     Tenderness: There is no abdominal tenderness.  Musculoskeletal:        General: No swelling.     Cervical back: Neck supple. No bony tenderness.     Thoracic back: No bony tenderness.     Lumbar back: No bony tenderness.  Skin:    General: Skin is warm and dry.     Capillary Refill: Capillary refill takes less than 2 seconds.  Neurological:     Mental Status: She is alert.  Psychiatric:        Mood and Affect: Mood normal.     ED Course/ Medical Decision Making/ A&P    Procedures .Critical Care  Performed by: Rogelia Jerilynn RAMAN, MD Authorized by: Rogelia Jerilynn RAMAN, MD   Critical care provider statement:    Critical care time (minutes):  30   Critical care was necessary to treat or prevent imminent or life-threatening deterioration of the following conditions: Ssevere anemia requiring blood transfusion.   Critical care was time spent personally by me on the following activities:  Development of treatment plan with patient or surrogate, discussions with consultants, evaluation of patient's response to treatment, examination of patient, ordering and review of laboratory studies, ordering and review of radiographic studies, ordering and performing treatments and interventions, pulse oximetry, re-evaluation of patient's  condition and review of old charts   I assumed direction of critical care for this patient from another provider in my specialty: no     Care discussed with: admitting provider      Medications Ordered in ED Medications  HYDROcodone -acetaminophen  (NORCO/VICODIN) 5-325 MG per tablet 1 tablet (has no administration in time range)  losartan  (COZAAR ) tablet 50 mg (50 mg Oral Given 02/02/24 1558)  metoprolol  succinate (TOPROL -XL) 24 hr tablet 25 mg (25 mg Oral Given  02/02/24 1558)  rosuvastatin  (CRESTOR ) tablet 40 mg (has no administration in time range)  levothyroxine  (SYNTHROID ) tablet 50 mcg (has no administration in time range)  meclizine  (ANTIVERT ) tablet 12.5-25 mg (has no administration in time range)  gabapentin  (NEURONTIN ) capsule 300 mg (300 mg Oral Given 02/02/24 1559)  cyclobenzaprine  (FLEXERIL ) tablet 5 mg (has no administration in time range)  pantoprazole  (PROTONIX ) injection 40 mg (40 mg Intravenous Given 02/02/24 1559)  acetaminophen  (TYLENOL ) tablet 650 mg (has no administration in time range)    Or  acetaminophen  (TYLENOL ) suppository 650 mg (has no administration in time range)  traZODone  (DESYREL ) tablet 25 mg (has no administration in time range)  ondansetron  (ZOFRAN ) tablet 4 mg (has no administration in time range)    Or  ondansetron  (ZOFRAN ) injection 4 mg (has no administration in time range)  albuterol  (PROVENTIL ) (2.5 MG/3ML) 0.083% nebulizer solution 2.5 mg (has no administration in time range)  0.9 %  sodium chloride  infusion (Manually program via Guardrails IV Fluids) ( Intravenous New Bag/Given 02/02/24 1409)  iohexol  (OMNIPAQUE ) 350 MG/ML injection 80 mL (100 mLs Intravenous Contrast Given 02/02/24 1403)  potassium chloride  SA (KLOR-CON  M) CR tablet 40 mEq (40 mEq Oral Given 02/02/24 1559)    Medical Decision Making:   SIERA BEYERSDORF is a 78 y.o. female who presents for anemia as per above.  Physical exam is pertinent for no focal abnormalities.   The  differential includes but is not limited to anemia, lab error, blood loss anemia, lower GI bleed, upper GI bleed.  Independent historian: None  External data reviewed: Labs and Notes: Reviewed patient's prior labs and notes, patient with baseline hemoglobin of 11-12, most recently 10.5 on 01/01/2024 when patient was seen at [facility] for chest pain consistent with her history  Labs: Ordered, Independent interpretation, and Details: CMP with mild elevation of creatinine to 1.19 from baseline of 1-1.1.  Does not rise to the level of AKI.  No emergent electrolyte derangement, mild LFT elevation which is not previously demonstrated on her prior labs.  CBC with no leukocytosis or thrombocytopenia, patient does have significant anemia 5.9.  Radiology: Ordered, Independent interpretation, Details: Personally reviewed CTA of the abdomen pelvis, do not appreciate focal fluid collection or active extravasation, and All images reviewed independently.  Agree with radiology report at this time.   CT Angio Abd/Pel W and/or Wo Contrast Result Date: 02/02/2024 EXAM: CTA ABDOMEN AND PELVIS WITH AND WITHOUT CONTRAST 02/02/2024 02:08:48 PM TECHNIQUE: CTA images of the abdomen and pelvis without and with intravenous contrast. Three-dimensional MIP/volume rendered formations were performed. Automated exposure control, iterative reconstruction, and/or weight based adjustment of the mA/kV was utilized to reduce the radiation dose to as low as reasonably achievable. COMPARISON: 01/01/2024 CLINICAL HISTORY: FINDINGS: VASCULATURE: AORTA: Aortic atherosclerotic calcifications. No acute finding. No abdominal aortic aneurysm. No dissection. CELIAC TRUNK: No acute finding. No occlusion or significant stenosis. SUPERIOR MESENTERIC ARTERY: No acute finding. No occlusion or significant stenosis. RENAL ARTERIES: No acute finding. No occlusion or significant stenosis. ILIAC ARTERIES: No acute finding. No occlusion or significant stenosis.  LIVER: There are a few small scattered low attenuation foci within the liver which appears similar in multiplicity and distribution compared with the previous exam. The largest is in the dome measuring 7 mm, image 16/9. Unchanged from previous study. GALLBLADDER AND BILE DUCTS: Status post cholecystectomy. Common bile duct measures up to 9 mm proximally, image 92/23. No obstructing stone identified. In the absence of clinical signs or symptoms of biliary obstruction, these  findings likely reflect post-cholecystectomy physiology. SPLEEN: The spleen is within normal limits in size and appearance. PANCREAS: Pancreas is normal in size and contour without focal lesion or ductal dilatation. ADRENAL GLANDS: Normal size and morphology bilaterally. No nodule, thickening, or hemorrhage. No periadrenal stranding. KIDNEYS, URETERS AND BLADDER: No stones in the kidneys or ureters. No hydronephrosis. No perinephric or periureteral stranding. Urinary bladder is unremarkable. GI AND BOWEL: Sigmoid colon demonstrates diverticulosis without evidence of diverticulitis. No bowel wall thickening, pericolonic stranding, abscess, or free air. REPRODUCTIVE: Reproductive organs are unremarkable. PERITONEUM AND RETROPERITONEUM: No ascites or free air. LUNG BASE: Similar appearance of small scattered lower lobe lung nodules compared with 01/01/2024. These all measure less than 5 mm. Index nodule in the posteromedial left base measures 3 mm, image 52/7. LYMPH NODES: No lymphadenopathy. BONES AND SOFT TISSUES: Colon status post hardware fixation of the L4-L5 vertebra. Lumbar spondylosis. No acute soft tissue abnormality. IMPRESSION: 1. No acute findings. 2. No signs of active GI bleed. 3. Sigmoid diverticulosis without signs of acute diverticulitis. 4. Small, less than 5 mm lung nodules identified within the lung bases. In a patient who was at low risk, no further follow-up is indicated. If the patient is at increased risk a follow-up CT of the  chest without contrast material in 12 months may be considered 5. Status post cholecystectomy. 6. Aortic atherosclerotic calcifications. 7. Moderate size hiatal hernia. Electronically signed by: Waddell Calk MD 02/02/2024 03:12 PM EDT RP Workstation: [id number]    EKG/Medicine tests: Not indicated EKG Interpretation:                  Interventions: Potassium, Protonix , PRBCs  See the EMR for full details regarding lab and imaging results.  Patient overall well-appearing on exam, hemodynamically stable, reports that she has had a month of ongoing melena.  Repeat labs obtained in the emergency department which do demonstrate significant anemia consistent with patient's reported history of abnormal labs drawn yesterday.  Patient has baseline hemoglobin of 10-11, therefore this represents significant blood loss, this incrementation with patient's Eliquis  use does warrant that patient is admitted for further workup.  Patient hemodynamically stable, therefore do not feel that she requires anticoagulant reversal at this time.  CTA obtained to evaluate for possible source of bleed, and unfortunately does not reveal target.  Given patient continues to be hemodynamically stable, will not involve GI emergently in the ED at this time.  Patient was transfused 1 unit PRBCs while in the ED.  Medicine consulted for admission, discussed with Dr. Zella who accepted the patient to his service.  Presentation is most consistent with acute life/limb-threatening illness  Discussion of management or test interpretations with external provider(s): Dr. Zella the hospitalists  Risk Drugs:Prescription drug management Treatment: Decision regarding hospitalization Critical Care: 31  Disposition: ADMIT: I believe the patient requires admission for further care and management. The patient was admitted to hospitalists. Please see inpatient provider note for additional treatment plan details.   MDM generated using  voice dictation software and may contain dictation errors.  Please contact me for any clarification or with any questions.  Clinical Impression:  1. Melena   2. Blood loss anemia      Admit   Final Clinical Impression(s) / ED Diagnoses Final diagnoses:  Melena  Blood loss anemia    Rx / DC Orders ED Discharge Orders     None        Rogelia Jerilynn RAMAN, MD 02/02/24 [phone]

## 2024-02-02 NOTE — Progress Notes (Signed)
 I have left a message for this patient to call our office immediately for critical results

## 2024-02-02 NOTE — Progress Notes (Signed)
 I have spoken to this patient's son and told him she needs to go to the ER for a work up of her critical anemia with abnormal liver and kidney functions especially because we do not know why she is bleeding and she is on a blood thinner

## 2024-02-03 ENCOUNTER — Inpatient Hospital Stay (HOSPITAL_COMMUNITY): Admitting: Anesthesiology

## 2024-02-03 ENCOUNTER — Encounter (HOSPITAL_COMMUNITY)
Admission: EM | Disposition: A | Discharge status: Home / Self Care | Payer: Self-pay | Source: Ambulatory Visit | Attending: Internal Medicine

## 2024-02-03 ENCOUNTER — Encounter (HOSPITAL_COMMUNITY): Payer: Self-pay | Admitting: Internal Medicine

## 2024-02-03 ENCOUNTER — Inpatient Hospital Stay (HOSPITAL_COMMUNITY)

## 2024-02-03 DIAGNOSIS — Z7901 Long term (current) use of anticoagulants: Secondary | ICD-10-CM

## 2024-02-03 DIAGNOSIS — I130 Hypertensive heart and chronic kidney disease with heart failure and stage 1 through stage 4 chronic kidney disease, or unspecified chronic kidney disease: Secondary | ICD-10-CM

## 2024-02-03 DIAGNOSIS — I5030 Unspecified diastolic (congestive) heart failure: Secondary | ICD-10-CM | POA: Diagnosis not present

## 2024-02-03 DIAGNOSIS — K922 Gastrointestinal hemorrhage, unspecified: Secondary | ICD-10-CM | POA: Diagnosis not present

## 2024-02-03 DIAGNOSIS — K2970 Gastritis, unspecified, without bleeding: Secondary | ICD-10-CM

## 2024-02-03 DIAGNOSIS — K317 Polyp of stomach and duodenum: Principal | ICD-10-CM

## 2024-02-03 DIAGNOSIS — D500 Iron deficiency anemia secondary to blood loss (chronic): Secondary | ICD-10-CM

## 2024-02-03 DIAGNOSIS — K449 Diaphragmatic hernia without obstruction or gangrene: Secondary | ICD-10-CM

## 2024-02-03 DIAGNOSIS — K2289 Other specified disease of esophagus: Secondary | ICD-10-CM

## 2024-02-03 DIAGNOSIS — N183 Chronic kidney disease, stage 3 unspecified: Secondary | ICD-10-CM | POA: Diagnosis not present

## 2024-02-03 DIAGNOSIS — K31A11 Gastric intestinal metaplasia without dysplasia, involving the antrum: Secondary | ICD-10-CM

## 2024-02-03 DIAGNOSIS — K3189 Other diseases of stomach and duodenum: Secondary | ICD-10-CM

## 2024-02-03 DIAGNOSIS — D62 Acute posthemorrhagic anemia: Secondary | ICD-10-CM

## 2024-02-03 DIAGNOSIS — K921 Melena: Principal | ICD-10-CM

## 2024-02-03 HISTORY — PX: ESOPHAGOGASTRODUODENOSCOPY: SHX5428

## 2024-02-03 LAB — CBC
HCT: 25.1 % — ABNORMAL LOW (ref 36.0–46.0)
HCT: 28.4 % — ABNORMAL LOW (ref 36.0–46.0)
Hemoglobin: 7.9 g/dL — ABNORMAL LOW (ref 12.0–15.0)
Hemoglobin: 8.3 g/dL — ABNORMAL LOW (ref 12.0–15.0)
MCH: 28.7 pg (ref 26.0–34.0)
MCH: 28.4 pg (ref 26.0–34.0)
MCHC: 31.5 g/dL (ref 30.0–36.0)
MCHC: 29.2 g/dL — ABNORMAL LOW (ref 30.0–36.0)
MCV: 91.3 fL (ref 80.0–100.0)
MCV: 97.3 fL (ref 80.0–100.0)
Platelets: 302 K/uL (ref 150–400)
Platelets: 299 K/uL (ref 150–400)
RBC: 2.75 MIL/uL — ABNORMAL LOW (ref 3.87–5.11)
RBC: 2.92 MIL/uL — ABNORMAL LOW (ref 3.87–5.11)
RDW: 14.7 % (ref 11.5–15.5)
RDW: 15 % (ref 11.5–15.5)
WBC: 7.6 K/uL (ref 4.0–10.5)
WBC: 7.8 K/uL (ref 4.0–10.5)
nRBC: 0 % (ref 0.0–0.2)
nRBC: 0 % (ref 0.0–0.2)

## 2024-02-03 LAB — COMPREHENSIVE METABOLIC PANEL WITH GFR
ALT: 57 U/L — ABNORMAL HIGH (ref 0–44)
ALT: 57 U/L — ABNORMAL HIGH (ref 0–44)
AST: 104 U/L — ABNORMAL HIGH (ref 15–41)
AST: 97 U/L — ABNORMAL HIGH (ref 15–41)
Albumin: 3.3 g/dL — ABNORMAL LOW (ref 3.5–5.0)
Albumin: 3.5 g/dL (ref 3.5–5.0)
Alkaline Phosphatase: 67 U/L (ref 38–126)
Alkaline Phosphatase: 74 U/L (ref 38–126)
Anion gap: 9 (ref 5–15)
Anion gap: 10 (ref 5–15)
BUN: 14 mg/dL (ref 8–23)
BUN: 14 mg/dL (ref 8–23)
CO2: 25 mmol/L (ref 22–32)
CO2: 21 mmol/L — ABNORMAL LOW (ref 22–32)
Calcium: 8.6 mg/dL — ABNORMAL LOW (ref 8.9–10.3)
Calcium: 8.5 mg/dL — ABNORMAL LOW (ref 8.9–10.3)
Chloride: 109 mmol/L (ref 98–111)
Chloride: 109 mmol/L (ref 98–111)
Creatinine, Ser: 1.05 mg/dL — ABNORMAL HIGH (ref 0.44–1.00)
Creatinine, Ser: 1 mg/dL (ref 0.44–1.00)
GFR, Estimated: 54 mL/min — ABNORMAL LOW (ref >60)
GFR, Estimated: 58 mL/min — ABNORMAL LOW (ref >60)
Glucose, Bld: 104 mg/dL — ABNORMAL HIGH (ref 70–99)
Glucose, Bld: 101 mg/dL — ABNORMAL HIGH (ref 70–99)
Potassium: 3.1 mmol/L — ABNORMAL LOW (ref 3.5–5.1)
Potassium: 3.7 mmol/L (ref 3.5–5.1)
Sodium: 142 mmol/L (ref 135–145)
Sodium: 140 mmol/L (ref 135–145)
Total Bilirubin: 0.4 mg/dL (ref 0.0–1.2)
Total Bilirubin: 0.4 mg/dL (ref 0.0–1.2)
Total Protein: 5.9 g/dL — ABNORMAL LOW (ref 6.5–8.1)
Total Protein: 6.1 g/dL — ABNORMAL LOW (ref 6.5–8.1)

## 2024-02-03 LAB — HEMOGLOBIN AND HEMATOCRIT, BLOOD
HCT: 25.7 % — ABNORMAL LOW (ref 36.0–46.0)
HCT: 25.6 % — ABNORMAL LOW (ref 36.0–46.0)
Hemoglobin: 8 g/dL — ABNORMAL LOW (ref 12.0–15.0)
Hemoglobin: 8 g/dL — ABNORMAL LOW (ref 12.0–15.0)

## 2024-02-03 SURGERY — EGD (ESOPHAGOGASTRODUODENOSCOPY)
Anesthesia: Monitor Anesthesia Care

## 2024-02-03 MED ORDER — HYDROMORPHONE HCL 1 MG/ML IJ SOLN
1.0000 mg | INTRAMUSCULAR | Status: DC | PRN
  Administered 2024-02-03: 1 mg via INTRAVENOUS
  Filled 2024-02-03: qty 1

## 2024-02-03 MED ORDER — PANTOPRAZOLE SODIUM 40 MG PO TBEC
40.0000 mg | DELAYED_RELEASE_TABLET | Freq: Two times a day (BID) | ORAL | Status: DC
  Administered 2024-02-03 – 2024-02-04 (×2): 40 mg via ORAL
  Filled 2024-02-03 (×2): qty 1

## 2024-02-03 MED ORDER — HYDROMORPHONE HCL 1 MG/ML IJ SOLN: 1.0000 mg | Freq: Once | INTRAMUSCULAR | Status: DC

## 2024-02-03 MED ORDER — SODIUM CHLORIDE 0.9 % IV SOLN
12.5000 mg | Freq: Once | INTRAVENOUS | Status: AC
  Administered 2024-02-03: 12.5 mg via INTRAVENOUS
  Filled 2024-02-03: qty 12.5

## 2024-02-03 MED ORDER — ONDANSETRON HCL 4 MG/2ML IJ SOLN
4.0000 mg | Freq: Once | INTRAMUSCULAR | Status: AC
  Administered 2024-02-03: 4 mg via INTRAVENOUS

## 2024-02-03 MED ORDER — EPINEPHRINE 1 MG/10ML IV SOSY
PREFILLED_SYRINGE | INTRAVENOUS | Status: AC
  Filled 2024-02-03: qty 10

## 2024-02-03 MED ORDER — POTASSIUM CHLORIDE 10 MEQ/100ML IV SOLN
10.0000 meq | INTRAVENOUS | Status: AC
  Administered 2024-02-03 (×3): 10 meq via INTRAVENOUS
  Filled 2024-02-03 (×4): qty 100

## 2024-02-03 MED ORDER — FENTANYL CITRATE (PF) 100 MCG/2ML IJ SOLN: 25.0000 ug | INTRAMUSCULAR | Status: DC | PRN

## 2024-02-03 MED ORDER — PROPOFOL 1000 MG/100ML IV EMUL
INTRAVENOUS | Status: AC
  Filled 2024-02-03: qty 100

## 2024-02-03 MED ORDER — SUCRALFATE 1 G PO TABS
1.0000 g | ORAL_TABLET | Freq: Two times a day (BID) | ORAL | Status: DC
  Administered 2024-02-03 – 2024-02-04 (×3): 1 g via ORAL
  Filled 2024-02-03 (×3): qty 1

## 2024-02-03 MED ORDER — LIDOCAINE 2% (20 MG/ML) 5 ML SYRINGE
INTRAMUSCULAR | Status: DC | PRN
  Administered 2024-02-03: 60 mg via INTRAVENOUS
  Administered 2024-02-03: 40 mg via INTRAVENOUS

## 2024-02-03 MED ORDER — SODIUM CHLORIDE 0.9 % IV SOLN: INTRAVENOUS | Status: DC | PRN

## 2024-02-03 MED ORDER — PROPOFOL 500 MG/50ML IV EMUL
INTRAVENOUS | Status: AC
Start: 2024-02-03 — End: 2024-02-03
  Filled 2024-02-03: qty 100

## 2024-02-03 MED ORDER — PROPOFOL 10 MG/ML IV BOLUS
INTRAVENOUS | Status: AC
  Filled 2024-02-03: qty 20

## 2024-02-03 MED ORDER — FENTANYL CITRATE (PF) 100 MCG/2ML IJ SOLN
INTRAMUSCULAR | Status: DC | PRN
  Administered 2024-02-03: 25 ug via INTRAVENOUS

## 2024-02-03 MED ORDER — HYOSCYAMINE SULFATE 0.125 MG SL SUBL
0.2500 mg | SUBLINGUAL_TABLET | Freq: Once | SUBLINGUAL | Status: AC
  Administered 2024-02-03: 0.25 mg via SUBLINGUAL
  Filled 2024-02-03: qty 2

## 2024-02-03 MED ORDER — FENTANYL CITRATE (PF) 100 MCG/2ML IJ SOLN
25.0000 ug | Freq: Once | INTRAMUSCULAR | Status: AC
  Administered 2024-02-03: 25 ug via INTRAVENOUS

## 2024-02-03 MED ORDER — ONDANSETRON HCL 4 MG/2ML IJ SOLN
INTRAMUSCULAR | Status: AC
  Filled 2024-02-03: qty 2

## 2024-02-03 MED ORDER — ONDANSETRON HCL 4 MG/2ML IJ SOLN
INTRAMUSCULAR | Status: DC | PRN
  Administered 2024-02-03: 4 mg via INTRAVENOUS

## 2024-02-03 MED ORDER — FENTANYL CITRATE (PF) 100 MCG/2ML IJ SOLN
INTRAMUSCULAR | Status: AC
  Filled 2024-02-03: qty 2

## 2024-02-03 MED ORDER — ALUM & MAG HYDROXIDE-SIMETH 200-200-20 MG/5ML PO SUSP
30.0000 mL | Freq: Once | ORAL | Status: AC
  Administered 2024-02-03: 30 mL via ORAL
  Filled 2024-02-03: qty 30

## 2024-02-03 MED ORDER — SODIUM CHLORIDE (PF) 0.9 % IJ SOLN
PREFILLED_SYRINGE | INTRAVENOUS | Status: DC | PRN
  Administered 2024-02-03: 3.5 mL

## 2024-02-03 MED ORDER — PROPOFOL 10 MG/ML IV BOLUS
INTRAVENOUS | Status: DC | PRN
  Administered 2024-02-03: 30 mg via INTRAVENOUS
  Administered 2024-02-03: 125 ug/kg/min via INTRAVENOUS
  Administered 2024-02-03: 70 mg via INTRAVENOUS

## 2024-02-03 NOTE — Anesthesia Preprocedure Evaluation (Addendum)
 Anesthesia Evaluation  Patient identified by MRN, date of birth, ID band Patient awake    Reviewed: Allergy & Precautions, NPO status , Patient's Chart, lab work & pertinent test results  History of Anesthesia Complications (+) PONV and history of anesthetic complications  Airway Mallampati: II  TM Distance: >3 FB     Dental no notable dental hx. (+) Teeth Intact, Missing, Implants, Caps, Dental Advisory Given   Pulmonary pneumonia, resolved   Pulmonary exam normal breath sounds clear to auscultation       Cardiovascular hypertension, Pt. on medications +CHF  Normal cardiovascular exam+ dysrhythmias Atrial Fibrillation  Rhythm:Regular Rate:Normal  EKG 07/11/23 NSR, LBBB pattern  Echo 04/05/19 1. Left ventricular ejection fraction, by visual estimation, is 60 to  65%. There is no increased left ventricular wall thickness.   2. Abnormal septal motion consistent with left bundle branch block.   3. Left ventricular diastolic parameters are consistent with Grade I  diastolic dysfunction (impaired relaxation).   4. Global right ventricle has normal systolc function.The right  ventricular size is normal. no increase in right ventricular wall  thickness.   5. Left atrial size was mildly dilated.   6. The mitral valve is normal in structure. Trivial mitral valve  regurgitation.   7. The pulmonic valve was normal in structure.   8. Normal pulmonary artery systolic pressure.   9. Frequent PVCs noted  10. The inferior vena cava is normal in size with greater than 50%  respiratory variability, suggesting right atrial pressure of 3 mmHg.  11. The tricuspid valve was normal in structure. Tricuspid valve  regurgitation is trivial.  12. Tricuspid valve regurgitation is trivial.   Cardiac cath 08/10/15 Normal coronaries    Neuro/Psych  Headaches PSYCHIATRIC DISORDERS Anxiety Depression       GI/Hepatic Neg liver ROS,GERD   Medicated,,Black stools   Endo/Other  Hypothyroidism  GLP-1 RA therapy- last dose ( states she's not taking ) Obesity HLD  Renal/GU Renal InsufficiencyRenal disease  negative genitourinary   Musculoskeletal  (+) Arthritis , Osteoarthritis,    Abdominal   Peds  Hematology  (+) Blood dyscrasia, anemia Eliquis  therapy- last dose 10/23    Anesthesia Other Findings   Reproductive/Obstetrics                              Anesthesia Physical Anesthesia Plan  ASA: 2  Anesthesia Plan:    Post-op Pain Management: Minimal or no pain anticipated   Induction: Intravenous  PONV Risk Score and Plan: Treatment may vary due to age or medical condition and Propofol  infusion  Airway Management Planned: Natural Airway and Nasal Cannula  Additional Equipment: None  Intra-op Plan:   Post-operative Plan:   Informed Consent: I have reviewed the patients History and Physical, chart, labs and discussed the procedure including the risks, benefits and alternatives for the proposed anesthesia with the patient or authorized representative who has indicated his/her understanding and acceptance.     Dental advisory given  Plan Discussed with: CRNA and Anesthesiologist  Anesthesia Plan Comments:          Anesthesia Quick Evaluation

## 2024-02-03 NOTE — Op Note (Signed)
 [facility] Patient Name: [name] [name] Procedure Date: 02/03/2024 MRN: [id number] Attending MD: Aloha Finner , MD, [id number] Date of Birth: 01-Jan-1946 CSN: [id number] Age: 78 Admit Type: Inpatient Procedure:                Upper GI endoscopy Indications:              Acute post hemorrhagic anemia, Iron deficiency                            anemia Providers:                Aloha Finner, MD, Collene Edu, RN, Haskel Chris, Technician Referring MD:              Medicines:                Monitored Anesthesia Care Complications:            No immediate complications. Estimated Blood Loss:     Estimated blood loss was minimal. Procedure:                Pre-Anesthesia Assessment:                           - Prior to the procedure, a History and Physical                            was performed, and patient medications and                            allergies were reviewed. The patient's tolerance of                            previous anesthesia was also reviewed. The risks                            and benefits of the procedure and the sedation                            options and risks were discussed with the patient.                            All questions were answered, and informed consent                            was obtained. Prior Anticoagulants: The patient has                            taken Eliquis  (apixaban ), last dose was 1 day prior                            to procedure. ASA Grade Assessment: III - A patient  with severe systemic disease. After reviewing the                            risks and benefits, the patient was deemed in                            satisfactory condition to undergo the procedure.                           After obtaining informed consent, the endoscope was                            passed under direct vision. Throughout the                             procedure, the patient's blood pressure, pulse, and                            oxygen saturations were monitored continuously. The                            GIF-H190 ([id number]) Olympus endoscope was introduced                            through the mouth, and advanced to the second part                            of duodenum. The GIF-H190 ([id number]) Olympus                            endoscope was introduced through the mouth, and                            advanced to the second part of duodenum. The upper                            GI endoscopy was technically difficult and complex                            due to olympus polyloop malfunction. Successful                            completion of the procedure was aided by performing                            the maneuvers documented (below) in this report.                            The patient tolerated the procedure. Scope In: Scope Out: Findings:      No gross lesions were noted in the entire esophagus.      The Z-line was irregular and was found 35 cm from the incisors.      A 5 cm hiatal hernia was present.  Patchy mildly erythematous mucosa without bleeding was found in the       entire examined stomach. Biopsies were taken with a cold forceps for       histology and Helicobacter pylori testing.      Multiple small semi-sessile fundic gland polyps were found in the       gastric fundus and in the gastric body.      A single greater than 50 mm semi-pedunculated polyp with no bleeding and       stigmata of recent bleeding was found in the gastric antrum/prepyloric       region of the stomach and it had prolapsed into the duodenum sweep. We       had to use multiple devices to pull the lesion into the stomach for more       appropriate positioning/visualization. Preparations were made for       attempt at mucosal resection. Demarcation of the lesion was performed       with high-definition white light and narrow band imaging to  clearly       identify the boundaries of the lesion. A 1:100000 solution of       epinephrine  (3.5 mL total) was injected to raise the lesion and to       decrease risk of bleeding. Olympus polyloop was attempted to be placed.       Unfortunately, the polyloop did not disengage and the device itself       broke. I had the device still attached to the polyloop ligator and it       could not be removed. I subsequently placed another EGD scope alongside       the other and went into the antral region. I used Endoscissors to cut       the polyloop tail end which allowed the other EGD scope/ligator catheter       to be removed safely. Subsequently, snare mucosal resection was       performed on top of the remaining polyloop. Resection and retrieval were       complete. Resected tissue margins were examined and clear of polyp       tissue. To prevent bleeding after mucosal resection, two hemostatic       clips were successfully placed. Clip manufacturer: [employer].       Unfortunately, the region is so thick stalked/broad-based that completel       closure was not possible (though the cinch of the polyloop was       underneath the resection cut/site by more than 1 cm. There was no       bleeding during, or at the end, of the procedure. Area was successfully       injected with 3 mL PuraStat for additional hemostasis needs.      No gross lesions were noted in the duodenal bulb, in the first portion       of the duodenum and in the second portion of the duodenum. Impression:               - No gross lesions in the entire esophagus. Z-line                            irregular, 35 cm from the incisors.                           -  5 cm hiatal hernia.                           - Erythematous mucosa in the stomach. Biopsied.                           - Multiple gastric polyps (fundic gland in                            appearance).                           - A single gastric polyp in  antrum/prepylorus that                            was prolapsed into the duodenum (>50 mm in size and                            semipedunculated and broad-based). Resected and                            retrieved with polyloop aid (see above for full                            details). Clips were placed. Clip manufacturer:                            [employer]. Injected Purastat for additional                            hemostasis.                           - No gross lesions in the duodenal bulb, in the                            first portion of the duodenum and in the second                            portion of the duodenum. Moderate Sedation:      Not Applicable - Patient had care per Anesthesia. Recommendation:           - The patient will be observed post-procedure,                            until all discharge criteria are met.                           - Return patient to hospital ward for ongoing care.                           - FLD recommended today.                           - Observe patient's clinical course.                           -  Await pathology results.                           - Hold Eliquis  for next 72 hours to decrease risk                            of bleeding post-intervention.                           - I will discuss with patient but I think with                            findings on EGD today, we can hold on Colonoscopy                            for now but consider this as outpatient overall                            based on clinical status.                           - If concern for active recurrent GI bleeding, then                            repeat EGD is recommended.                           - The findings and recommendations were discussed                            with the patient.                           - The findings and recommendations were discussed                            with the referring physician. Procedure Code(s):         --- Professional ---                           [id number], Esophagogastroduodenoscopy, flexible,                            transoral; with endoscopic mucosal resection                           43255, 59, Esophagogastroduodenoscopy, flexible,                            transoral; with control of bleeding, any method                           43239, 59, Esophagogastroduodenoscopy, flexible,                            transoral; with biopsy, single or  multiple Diagnosis Code(s):        --- Professional ---                           K22.89, Other specified disease of esophagus                           K44.9, Diaphragmatic hernia without obstruction or                            gangrene                           K31.89, Other diseases of stomach and duodenum                           K31.7, Polyp of stomach and duodenum                           D62, Acute posthemorrhagic anemia                           D50.9, Iron deficiency anemia, unspecified CPT copyright 2022 [employer]. All rights reserved. The codes documented in this report are preliminary and upon coder review may  be revised to meet current compliance requirements. Aloha Finner, MD 02/03/2024 1:50:24 PM Number of Addenda: 0

## 2024-02-03 NOTE — Anesthesia Postprocedure Evaluation (Signed)
 Anesthesia Post Note  Patient: [name] [name]  Procedure(s) Performed: EGD (ESOPHAGOGASTRODUODENOSCOPY)     Patient location during evaluation: PACU Anesthesia Type: MAC Level of consciousness: awake and alert and oriented Pain management: pain level controlled Vital Signs Assessment: post-procedure vital signs reviewed and stable Respiratory status: spontaneous breathing, nonlabored ventilation and respiratory function stable Cardiovascular status: blood pressure returned to baseline and stable Postop Assessment: no apparent nausea or vomiting Anesthetic complications: no   No notable events documented.  Last Vitals:  Vitals:   02/03/24 1410 02/03/24 1419  BP: (!) 175/69   Pulse: 69 67  Resp: 12 14  Temp:    SpO2: 96% 96%    Last Pain:  Vitals:   02/03/24 1419  TempSrc:   PainSc: 9                  Loy Mccartt A.

## 2024-02-03 NOTE — Progress Notes (Signed)
 PROGRESS NOTE    [name] [name]  FMW:[id number] DOB: 08-23-1945 DOA: 02/02/2024 PCP: Dorcus Lamar POUR, MD    Brief Narrative:   [name] [name] is a 78 y.o. female with past medical history significant for chronic diastolic congestive heart failure, paroxysmal atrial fibrillation [name] , HTN, HLD, hypothyroidism, GERD who presented to [facility] ED on 02/02/2024 as directed by PCP for lab abnormality.  Reported outpatient hemoglobin of 6.3.  Patient reports 3-4 weeks ago, vague abdominal pain with bloating and the stools became dark.  Also endorsed nausea without vomiting.  She reports did not think of the dark stools of concern since she takes iron.  Also becoming more weak; and sought further care by her PCP.  Labs revealing significant anemia and patient was directed to the ED for further evaluation.  In the ED, temperature 98.0 F, HR 70, RR 17, BP 140/72, SpO2 100% on room air.  WBC 7.4, hemoglobin 5.9, platelet count 340.  Sodium 141, potassium 3.1, chloride 107, CO2 24, glucose 88, BUN 21, creat 1.19.  AST 135, ALT 65, total Ruben 0.2.  Tylenol  level less than 10.  Urinalysis with large hemoglobin, negative leukocytes/nitrite, rare bacteria, 0-5 RBCs, 0-5 WBCs.  CT angiogram abdomen/pelvis with contrast with no acute findings, no signs of active GI bleed, noted sigmoid diverticulosis without signs of acute diverticulitis, small less than 5 mm lung nodules within lung bases, status post cholecystectomy, aortic atherosclerotic calcifications, moderate size hiatal hernia.  1 unit PRBC was ordered.  GI was consulted.  TRH consulted for admission for further evaluation and management of symptomatic anemia; rule out upper GI bleed.  Assessment & Plan:   Symptomatic anemia Concern for upper GI bleed Patient presenting with progressive weakness, fatigue in the setting of dark stools.  Does take iron supplements, also [name] .  Denies NSAID abuse.  Seen by PCP with hemoglobin  noted to be 6.3.  On arrival to the ED hemoglobin down to 5.9.  FOBT positive.  CT angiogram abdomen/pelvis negative for identification of a bleeding source. -- [facility] following, appreciate assistance -- Hgb 5.9>7.0>8.0>7.9>8.0, stable following transfusion -- s/p 2u pRBC on 10/24 -- Holding home Eliquis  -- Protonix  40 mg PO BID -- Carafate  1 g p.o. twice daily -- Continue to monitor hemoglobin -- EGD planned this afternoon, n.p.o.  Elevated LFTs: Improving On admission, patient with notable elevated LFTs with AST 135, ALT 65, ALP 72, total bilirubin less than 0.2.  She is s/p cholecystectomy.  CT abdomen/pelvis with 9 mm common bile duct with no obstructing stone.  Acute hepatitis panel negative.  Suspect elevated LFTs likely secondary to mild shock liver in the setting of anemia as above. -- AST 135>104 -- ALT 65>57 -- Tbili <0.2>0.4 -- CMP in am  Hypokalemia Potassium 3.1, will continue repletion. --Repeat electrolytes in a.m.  Chronic diastolic congestive heart failure, compensated Paroxysmal atrial fibrillation [name]  HTN -- Holding Eliquis  as above -- Metoprolol  succinate 25 mg p.o. daily -- Losartan  50 mg p.o. daily  HLD -- Crestor  40 mg p.o. daily  Hypothyroidism -- Levothyroxine  50 mcg p.o. daily  GERD -- On PPI as above     DVT prophylaxis: SCDs Start: 02/02/24 1538    Code Status: Full Code Family Communication: No family present at bedside this morning  Disposition Plan:  Level of care: Progressive Status is: Inpatient Remains inpatient appropriate because: EGD    Consultants:   [facility]   Procedures:  EGD  Antimicrobials:  None  Subjective: Patient seen examined bedside, lying in bed.  Waiting for EGD planned for this afternoon.  Hemoglobin stable, 8.0 this morning.  Transfused 2 unit PRBCs yesterday.  No complaints this morning.  Fatigue/weakness much improved.  Denies headache, no visual changes, no chest pain, no  palpitations, no shortness of breath, no abdominal pain, no fever/chills/night sweats, no nausea/vomiting/diarrhea, no focal weakness, no paresthesias.  No acute events overnight per nursing staff.  Objective: Vitals:   02/03/24 0130 02/03/24 0151 02/03/24 0555 02/03/24 1154  BP: 139/70 133/68 134/63 (!) 143/76  Pulse: 72 69 73 73  Resp: 16 18 12 11   Temp: 98.4 F (36.9 C) 98.3 F (36.8 C) 98.8 F (37.1 C) (!) 97.1 F (36.2 C)  TempSrc: Oral Oral Oral Temporal  SpO2: 99% 97% 97% 98%  Weight:    99.8 kg  Height:    5' 5 (1.651 m)    Intake/Output Summary (Last 24 hours) at 02/03/2024 1313 Last data filed at 02/03/2024 1004 Gross per 24 hour  Intake 784.17 ml  Output --  Net 784.17 ml   Filed Weights   02/02/24 1731 02/03/24 1154  Weight: 99.8 kg 99.8 kg    Examination:  Physical Exam: GEN: NAD, alert and oriented x 3, obese HEENT: NCAT, PERRL, EOMI, sclera clear, MMM PULM: CTAB w/o wheezes/crackles, normal respiratory effort, on room air CV: RRR GI: abd soft, NTND, + BS MSK: no peripheral edema, moves all extremity dependently NEURO: No focal neurological deficit PSYCH: normal mood/affect Integumentary: dry/intact, no rashes or wounds    Data Reviewed: I have personally reviewed following labs and imaging studies  CBC: Recent Labs  Lab 02/02/24 1046 02/02/24 1919 02/03/24 0328 02/03/24 0332 02/03/24 1045  WBC 7.4  --   --  7.6  --   HGB 5.9* 7.0* 8.0* 7.9* 8.0*  HCT 19.8* 23.2* 25.7* 25.1* 25.6*  MCV 93.4  --   --  91.3  --   PLT 340  --   --  302  --    Basic Metabolic Panel: Recent Labs  Lab 02/02/24 1046 02/03/24 0332  NA 141 142  K 3.1* 3.1*  CL 107 109  CO2 24 25  GLUCOSE 88 104*  BUN 21 14  CREATININE 1.19* 1.05*  CALCIUM  8.8* 8.6*   GFR: Estimated Creatinine Clearance: 52.5 mL/min (A) (by C-G formula based on SCr of 1.05 mg/dL (H)). Liver Function Tests: Recent Labs  Lab 02/02/24 1046 02/03/24 0332  AST 135* 104*  ALT 65* 57*   ALKPHOS 72 67  BILITOT <0.2 0.4  PROT 6.4* 5.9*  ALBUMIN  3.6 3.3*   No results for input(s): LIPASE, AMYLASE in the last 168 hours. No results for input(s): AMMONIA in the last 168 hours. Coagulation Profile: No results for input(s): INR, PROTIME in the last 168 hours. Cardiac Enzymes: No results for input(s): CKTOTAL, CKMB, CKMBINDEX, TROPONINI in the last 168 hours. BNP (last 3 results) No results for input(s): PROBNP in the last 8760 hours. HbA1C: No results for input(s): HGBA1C in the last 72 hours. CBG: No results for input(s): GLUCAP in the last 168 hours. Lipid Profile: No results for input(s): CHOL, HDL, LDLCALC, TRIG, CHOLHDL, LDLDIRECT in the last 72 hours. Thyroid  Function Tests: No results for input(s): TSH, T4TOTAL, FREET4, T3FREE, THYROIDAB in the last 72 hours. Anemia Panel: No results for input(s): VITAMINB12, FOLATE, FERRITIN, TIBC, IRON, RETICCTPCT in the last 72 hours. Sepsis Labs: No results for input(s): PROCALCITON, LATICACIDVEN in the last 168 hours.  No results  found for this or any previous visit (from the past 240 hours).       Radiology Studies: CT Angio Abd/Pel W and/or Wo Contrast Result Date: 02/02/2024 EXAM: CTA ABDOMEN AND PELVIS WITH AND WITHOUT CONTRAST 02/02/2024 02:08:48 PM TECHNIQUE: CTA images of the abdomen and pelvis without and with intravenous contrast. Three-dimensional MIP/volume rendered formations were performed. Automated exposure control, iterative reconstruction, and/or weight based adjustment of the mA/kV was utilized to reduce the radiation dose to as low as reasonably achievable. COMPARISON: 01/01/2024 CLINICAL HISTORY: FINDINGS: VASCULATURE: AORTA: Aortic atherosclerotic calcifications. No acute finding. No abdominal aortic aneurysm. No dissection. CELIAC TRUNK: No acute finding. No occlusion or significant stenosis. SUPERIOR MESENTERIC ARTERY: No acute finding. No  occlusion or significant stenosis. RENAL ARTERIES: No acute finding. No occlusion or significant stenosis. ILIAC ARTERIES: No acute finding. No occlusion or significant stenosis. LIVER: There are a few small scattered low attenuation foci within the liver which appears similar in multiplicity and distribution compared with the previous exam. The largest is in the dome measuring 7 mm, image 16/9. Unchanged from previous study. GALLBLADDER AND BILE DUCTS: Status post cholecystectomy. Common bile duct measures up to 9 mm proximally, image 92/23. No obstructing stone identified. In the absence of clinical signs or symptoms of biliary obstruction, these findings likely reflect post-cholecystectomy physiology. SPLEEN: The spleen is within normal limits in size and appearance. PANCREAS: Pancreas is normal in size and contour without focal lesion or ductal dilatation. ADRENAL GLANDS: Normal size and morphology bilaterally. No nodule, thickening, or hemorrhage. No periadrenal stranding. KIDNEYS, URETERS AND BLADDER: No stones in the kidneys or ureters. No hydronephrosis. No perinephric or periureteral stranding. Urinary bladder is unremarkable. GI AND BOWEL: Sigmoid colon demonstrates diverticulosis without evidence of diverticulitis. No bowel wall thickening, pericolonic stranding, abscess, or free air. REPRODUCTIVE: Reproductive organs are unremarkable. PERITONEUM AND RETROPERITONEUM: No ascites or free air. LUNG BASE: Similar appearance of small scattered lower lobe lung nodules compared with 01/01/2024. These all measure less than 5 mm. Index nodule in the posteromedial left base measures 3 mm, image 52/7. LYMPH NODES: No lymphadenopathy. BONES AND SOFT TISSUES: Colon status post hardware fixation of the L4-L5 vertebra. Lumbar spondylosis. No acute soft tissue abnormality. IMPRESSION: 1. No acute findings. 2. No signs of active GI bleed. 3. Sigmoid diverticulosis without signs of acute diverticulitis. 4. Small, less than  5 mm lung nodules identified within the lung bases. In a patient who was at low risk, no further follow-up is indicated. If the patient is at increased risk a follow-up CT of the chest without contrast material in 12 months may be considered 5. Status post cholecystectomy. 6. Aortic atherosclerotic calcifications. 7. Moderate size hiatal hernia. Electronically signed by: Waddell Calk MD 02/02/2024 03:12 PM EDT RP Workstation: [id number]        Scheduled Meds:  [MAR Hold] gabapentin   300 mg Oral BID   [MAR Hold] levothyroxine   50 mcg Oral Q0600   [MAR Hold] losartan   50 mg Oral Daily   [MAR Hold] metoprolol  succinate  25 mg Oral Daily   [MAR Hold] pantoprazole  (PROTONIX ) IV  40 mg Intravenous Q12H   [MAR Hold] rosuvastatin   40 mg Oral QHS   Continuous Infusions:  sodium chloride        LOS: 1 day    Time spent: 52 minutes spent on 02/03/2024 caring for this patient face-to-face including chart review, ordering labs/tests, documenting, discussion with nursing staff, consultants, updating family and interview/physical exam    Camellia PARAS Neyah Ellerman, DO [facility] Available via Epic secure chat 7am-7pm After these hours, please refer to coverage provider listed on [email or web address] 02/03/2024, 1:13 PM

## 2024-02-03 NOTE — OR Nursing (Signed)
 X-ray at bedside. [name] [name]

## 2024-02-04 DIAGNOSIS — Z7901 Long term (current) use of anticoagulants: Secondary | ICD-10-CM

## 2024-02-04 DIAGNOSIS — K922 Gastrointestinal hemorrhage, unspecified: Secondary | ICD-10-CM | POA: Diagnosis not present

## 2024-02-04 LAB — COMPREHENSIVE METABOLIC PANEL WITH GFR
ALT: 53 U/L — ABNORMAL HIGH (ref 0–44)
AST: 86 U/L — ABNORMAL HIGH (ref 15–41)
Albumin: 3.3 g/dL — ABNORMAL LOW (ref 3.5–5.0)
Alkaline Phosphatase: 67 U/L (ref 38–126)
Anion gap: 8 (ref 5–15)
BUN: 10 mg/dL (ref 8–23)
CO2: 26 mmol/L (ref 22–32)
Calcium: 8.7 mg/dL — ABNORMAL LOW (ref 8.9–10.3)
Chloride: 110 mmol/L (ref 98–111)
Creatinine, Ser: 1.01 mg/dL — ABNORMAL HIGH (ref 0.44–1.00)
GFR, Estimated: 57 mL/min — ABNORMAL LOW (ref >60)
Glucose, Bld: 96 mg/dL (ref 70–99)
Potassium: 3.6 mmol/L (ref 3.5–5.1)
Sodium: 143 mmol/L (ref 135–145)
Total Bilirubin: 0.3 mg/dL (ref 0.0–1.2)
Total Protein: 5.9 g/dL — ABNORMAL LOW (ref 6.5–8.1)

## 2024-02-04 LAB — CBC
HCT: 26.1 % — ABNORMAL LOW (ref 36.0–46.0)
Hemoglobin: 7.8 g/dL — ABNORMAL LOW (ref 12.0–15.0)
MCH: 28.1 pg (ref 26.0–34.0)
MCHC: 29.9 g/dL — ABNORMAL LOW (ref 30.0–36.0)
MCV: 93.9 fL (ref 80.0–100.0)
Platelets: 261 K/uL (ref 150–400)
RBC: 2.78 MIL/uL — ABNORMAL LOW (ref 3.87–5.11)
RDW: 14.8 % (ref 11.5–15.5)
WBC: 7.4 K/uL (ref 4.0–10.5)
nRBC: 0 % (ref 0.0–0.2)

## 2024-02-04 LAB — MAGNESIUM: Magnesium: 2.4 mg/dL (ref 1.7–2.4)

## 2024-02-04 LAB — HEMOGLOBIN AND HEMATOCRIT, BLOOD
HCT: 27.2 % — ABNORMAL LOW (ref 36.0–46.0)
Hemoglobin: 8.3 g/dL — ABNORMAL LOW (ref 12.0–15.0)

## 2024-02-04 MED ORDER — SUCRALFATE 1 G PO TABS: 1.0000 g | ORAL_TABLET | Freq: Three times a day (TID) | ORAL | 0 refills | Status: AC

## 2024-02-04 MED ORDER — PANTOPRAZOLE SODIUM 40 MG PO TBEC: DELAYED_RELEASE_TABLET | ORAL | 0 refills | Status: DC

## 2024-02-04 MED ORDER — ACETAMINOPHEN 325 MG PO TABS: 650.0000 mg | ORAL_TABLET | Freq: Four times a day (QID) | ORAL | Status: DC | PRN

## 2024-02-04 NOTE — Transfer of Care (Signed)
 Immediate Anesthesia Transfer of Care Note  Patient: [name] [name]  Procedure(s) Performed: EGD (ESOPHAGOGASTRODUODENOSCOPY)  Patient Location: Endoscopy Unit  Anesthesia Type:MAC  Level of Consciousness: drowsy and patient cooperative  Airway & Oxygen Therapy: Patient Spontanous Breathing and Patient connected to face mask oxygen  Post-op Assessment: Report given to RN and Post -op Vital signs reviewed and stable  Post vital signs: Reviewed and stable  Last Vitals:  Vitals Value Taken Time  BP 168/59 02/03/24 13:42  Temp 36.4 02/03/24 13:42  Pulse 62 02/03/24 13:42  Resp 16 02/03/24 13:42  SpO2 100% 02/03/24 13:42  Vitals shown include unfiled device data.  Last Pain:  Vitals:   02/04/24 0510  TempSrc: Oral  PainSc:       Patients Stated Pain Goal: 4 (02/03/24 1419)  Complications: No notable events documented.

## 2024-02-04 NOTE — Progress Notes (Signed)
 Gastroenterology Inpatient Follow-up Note   PATIENT IDENTIFICATION  [name] [name] is a 78 y.o. female Hospital Day: 3  SUBJECTIVE  The patient's chart has been reviewed. The patient's labs have been reviewed. Today, the patient is feeling better than postprocedure.  She still has a abdominal upset/acid indigestion that she is feeling today but certainly not to the degree of pain that she had post polypectomy yesterday. The patient denies fevers or chills. The patient is hopeful that she will be able to go home but also advance her diet as quickly. She wonders if a colonoscopy may be required before she leaves the hospital.   OBJECTIVE   Scheduled Inpatient Medications:   gabapentin   300 mg Oral BID   levothyroxine   50 mcg Oral Q0600   losartan   50 mg Oral Daily   metoprolol  succinate  25 mg Oral Daily   pantoprazole   40 mg Oral BID   rosuvastatin   40 mg Oral QHS   sucralfate   1 g Oral BID   Continuous Inpatient Infusions:  PRN Inpatient Medications: acetaminophen  **OR** acetaminophen , albuterol , cyclobenzaprine , HYDROcodone -acetaminophen , HYDROmorphone  (DILAUDID ) injection, meclizine , ondansetron  **OR** ondansetron  (ZOFRAN ) IV, traZODone    Physical Examination   Temp:  [97.1 F (36.2 C)-99.1 F (37.3 C)] 99.1 F (37.3 C) (10/26 0510) Pulse Rate:  [64-77] 77 (10/26 0510) Resp:  [11-20] 20 (10/26 0510) BP: (128-208)/(59-137) 133/62 (10/26 0510) SpO2:  [93 %-100 %] 99 % (10/26 0510) Weight:  [99.8 kg] 99.8 kg (10/25 1154) Temp (24hrs), Avg:98.3 F (36.8 C), Min:97.1 F (36.2 C), Max:99.1 F (37.3 C)  Weight: 99.8 kg GEN: NAD, appears stated age, doesn't appear chronically ill, son at bedside PSYCH: Cooperative, without pressured speech EYE: Conjunctivae pink, sclerae anicteric ENT: MMM CV: Nontachycardic RESP: No audible wheezing GI: NABS, soft, minimal tenderness to palpation in midepigastrium, no rebound or guarding  MSK/EXT: No significant lower extremity  edema SKIN: No jaundice NEURO:  Alert & Oriented x 3, no focal deficits   Review of Data   Laboratory Studies   Recent Labs  Lab 02/04/24 0414  NA 143  K 3.6  CL 110  CO2 26  BUN 10  CREATININE 1.01*  GLUCOSE 96  CALCIUM  8.7*  MG 2.4   Recent Labs  Lab 02/04/24 0414  AST 86*  ALT 53*  ALKPHOS 67    Recent Labs  Lab 02/03/24 0332 02/03/24 1045 02/03/24 1710 02/04/24 0414  WBC 7.6  --  7.8 7.4  HGB 7.9*   < > 8.3* 7.8*  HCT 25.1*   < > 28.4* 26.1*  PLT 302  --  299 261   < > = values in this interval not displayed.   No results for input(s): APTT, INR in the last 168 hours.  Imaging Studies   DG Abd Portable 2V Result Date: 02/03/2024 CLINICAL DATA:  Chest and upper abdominal pain.  EGD done today. EXAM: PORTABLE ABDOMEN - 2 VIEW; PORTABLE CHEST - 1 VIEW COMPARISON:  04/13/2022, 01/01/2024. FINDINGS: Chest: Heart size is normal and mediastinal contours within normal limits. A small hiatal hernia is present. There is mild airspace disease in the mid left lung and left lung base. No effusion or pneumothorax is seen. Cervical spinal fusion hardware is present. No acute osseous abnormality. Abdomen: The bowel gas pattern is normal. There is no evidence of free air. Cholecystectomy clips are noted in the right upper quadrant. Lumbar spinal fusion hardware is present. No radio-opaque calculi or other acute radiographic abnormality is seen. IMPRESSION: 1. Mild  airspace disease in the mid to lower left lung, possible atelectasis or infiltrate. 2. No evidence of bowel obstruction or free air. 3. Small hiatal hernia. Electronically Signed   By: Leita Birmingham M.D.   On: 02/03/2024 15:14   DG Chest Port 1 View Result Date: 02/03/2024 CLINICAL DATA:  Chest and upper abdominal pain.  EGD done today. EXAM: PORTABLE ABDOMEN - 2 VIEW; PORTABLE CHEST - 1 VIEW COMPARISON:  04/13/2022, 01/01/2024. FINDINGS: Chest: Heart size is normal and mediastinal contours within normal limits. A  small hiatal hernia is present. There is mild airspace disease in the mid left lung and left lung base. No effusion or pneumothorax is seen. Cervical spinal fusion hardware is present. No acute osseous abnormality. Abdomen: The bowel gas pattern is normal. There is no evidence of free air. Cholecystectomy clips are noted in the right upper quadrant. Lumbar spinal fusion hardware is present. No radio-opaque calculi or other acute radiographic abnormality is seen. IMPRESSION: 1. Mild airspace disease in the mid to lower left lung, possible atelectasis or infiltrate. 2. No evidence of bowel obstruction or free air. 3. Small hiatal hernia. Electronically Signed   By: Leita Birmingham M.D.   On: 02/03/2024 15:14   CT Angio Abd/Pel W and/or Wo Contrast Result Date: 02/02/2024 EXAM: CTA ABDOMEN AND PELVIS WITH AND WITHOUT CONTRAST 02/02/2024 02:08:48 PM TECHNIQUE: CTA images of the abdomen and pelvis without and with intravenous contrast. Three-dimensional MIP/volume rendered formations were performed. Automated exposure control, iterative reconstruction, and/or weight based adjustment of the mA/kV was utilized to reduce the radiation dose to as low as reasonably achievable. COMPARISON: 01/01/2024 CLINICAL HISTORY: FINDINGS: VASCULATURE: AORTA: Aortic atherosclerotic calcifications. No acute finding. No abdominal aortic aneurysm. No dissection. CELIAC TRUNK: No acute finding. No occlusion or significant stenosis. SUPERIOR MESENTERIC ARTERY: No acute finding. No occlusion or significant stenosis. RENAL ARTERIES: No acute finding. No occlusion or significant stenosis. ILIAC ARTERIES: No acute finding. No occlusion or significant stenosis. LIVER: There are a few small scattered low attenuation foci within the liver which appears similar in multiplicity and distribution compared with the previous exam. The largest is in the dome measuring 7 mm, image 16/9. Unchanged from previous study. GALLBLADDER AND BILE DUCTS: Status post  cholecystectomy. Common bile duct measures up to 9 mm proximally, image 92/23. No obstructing stone identified. In the absence of clinical signs or symptoms of biliary obstruction, these findings likely reflect post-cholecystectomy physiology. SPLEEN: The spleen is within normal limits in size and appearance. PANCREAS: Pancreas is normal in size and contour without focal lesion or ductal dilatation. ADRENAL GLANDS: Normal size and morphology bilaterally. No nodule, thickening, or hemorrhage. No periadrenal stranding. KIDNEYS, URETERS AND BLADDER: No stones in the kidneys or ureters. No hydronephrosis. No perinephric or periureteral stranding. Urinary bladder is unremarkable. GI AND BOWEL: Sigmoid colon demonstrates diverticulosis without evidence of diverticulitis. No bowel wall thickening, pericolonic stranding, abscess, or free air. REPRODUCTIVE: Reproductive organs are unremarkable. PERITONEUM AND RETROPERITONEUM: No ascites or free air. LUNG BASE: Similar appearance of small scattered lower lobe lung nodules compared with 01/01/2024. These all measure less than 5 mm. Index nodule in the posteromedial left base measures 3 mm, image 52/7. LYMPH NODES: No lymphadenopathy. BONES AND SOFT TISSUES: Colon status post hardware fixation of the L4-L5 vertebra. Lumbar spondylosis. No acute soft tissue abnormality. IMPRESSION: 1. No acute findings. 2. No signs of active GI bleed. 3. Sigmoid diverticulosis without signs of acute diverticulitis. 4. Small, less than 5 mm lung nodules identified within the  lung bases. In a patient who was at low risk, no further follow-up is indicated. If the patient is at increased risk a follow-up CT of the chest without contrast material in 12 months may be considered 5. Status post cholecystectomy. 6. Aortic atherosclerotic calcifications. 7. Moderate size hiatal hernia. Electronically signed by: Waddell Calk MD 02/02/2024 03:12 PM EDT RP Workstation: [id number]    GI Procedures and  Studies  EGD - No gross lesions in the entire esophagus. Z-line irregular, 35 cm from the incisors. - 5 cm hiatal hernia. - Erythematous mucosa in the stomach. Biopsied. 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 - A single gastric polyp in antrum/prepylorus that was prolapsed into the duodenum (>50 mm in size and semipedunculated and broad-based). Resected and retrieved with polyloop aid (see above for full details). Clips were placed. Clip manufacturer: [employer]. Injected Purastat for additional hemostasis. - No gross lesions in the duodenal bulb, in the first portion of the duodenum and in the second portion of the duodenum.   ASSESSMENT  [name] [name] is a 78 y.o. female with a pmh significant for hypertension, hyperlipidemia, hypothyroidism, prior breast cancer, A-fib ([name] ), chronic back pain, GERD.  Patient admitted with heme positive acute on chronic blood loss anemia on anticoagulation and found to have large gastric polypoid lesion postresection.  The patient is clinically and hemodynamically stable at this time.  The most likely etiology of her heme positive melanic appearing dark stools is the large gastric polyp that we resected yesterday.  Certainly it is possible she could have other issues in the colon or small bowel, but common things being common will go ahead and work through that this is the most likely etiology for now.  We will hold on inpatient colonoscopy.  Outpatient colonoscopy could be required at some point.  I will follow-up the pathology when it returns this week and update the patient accordingly.  I suspect this will be an inflammatory polyp, but time will tell.  I will advance the patient's diet, if she is doing well, she could potentially be discharged soon.  All patient questions were answered to the best of my ability, and the patient agrees to the aforementioned plan of action with follow-up as indicated.   PLAN/RECOMMENDATIONS  Advance diet  as tolerated PPI 40 mg twice daily - Continue this for 2 months and then go to once daily Carafate  1 g before every meal plus nightly - Continue this for 1 month Follow-up GI pathology Inpatient colonoscopy is not necessary at this time, though if patient continues to have issues with anemia and iron deficiency we may need to perform this as an outpatient I will arrange follow-up CBC later this week in my office I will arrange follow-up after all pathology is back If patient can receive IV iron as outpatient expeditiously, that may be helpful for her P.o. iron to start in 1 week once daily ferrous gluconate or ferrous sulfate  Eliquis  may restart on 10/28 PM to give a full 72 hours off of anticoagulation Okay for discharge if patient is doing well this afternoon   Please page/call with questions or concerns.   Aloha Finner, MD  [facility] Office # [phone]    LOS: 2 days  Aloha Finner Raddle  02/04/2024, 6:07 AM

## 2024-02-04 NOTE — Discharge Summary (Signed)
 Physician Discharge Summary  [name] FMW:[id number] DOB: 04-20-1945 DOA: 02/02/2024  PCP: Dorcus Lamar POUR, MD  Admit date: 02/02/2024 Discharge date: 02/04/2024  Admitted From: Home Disposition: Home  Recommendations for Outpatient Follow-up:  Follow up with PCP in 1-2 weeks  Follow-up with gastroenterology as scheduled; will need outpatient colonoscopy Referral placed for IV iron infusion Continue Protonix  40 mg p.o. twice daily x 2 months followed by once daily thereafter Continue Carafate  1 g p.o. 3 times daily AC with meals x 1 month May resume Eliquis  on 02/06/2024 per GI Please obtain CMP/CBC in one week to ensure hemoglobin stable, 8.3 at time of discharge Please follow up on the following pending results: Pathology from biopsy results from EGD they are pending at time of discharge  Home Health: No Equipment/Devices: None  Discharge Condition: Stable CODE STATUS: Full code Diet recommendation: Heart healthy diet  History of present illness:  [name] [name] is a 78 y.o. female with past medical history significant for chronic diastolic congestive heart failure, paroxysmal atrial fibrillation [name] , HTN, HLD, hypothyroidism, GERD who presented to [facility] ED on 02/02/2024 as directed by PCP for lab abnormality.  Reported outpatient hemoglobin of 6.3.  Patient reports 3-4 weeks ago, vague abdominal pain with bloating and the stools became dark.  Also endorsed nausea without vomiting.  She reports did not think of the dark stools of concern since she takes iron.  Also becoming more weak; and sought further care by her PCP.  Labs revealing significant anemia and patient was directed to the ED for further evaluation.   In the ED, temperature 98.0 F, HR 70, RR 17, BP 140/72, SpO2 100% on room air.  WBC 7.4, hemoglobin 5.9, platelet count 340.  Sodium 141, potassium 3.1, chloride 107, CO2 24, glucose 88, BUN 21, creat 1.19.  AST 135, ALT 65, total Ruben  0.2.  Tylenol  level less than 10.  Urinalysis with large hemoglobin, negative leukocytes/nitrite, rare bacteria, 0-5 RBCs, 0-5 WBCs.  CT angiogram abdomen/pelvis with contrast with no acute findings, no signs of active GI bleed, noted sigmoid diverticulosis without signs of acute diverticulitis, small less than 5 mm lung nodules within lung bases, status post cholecystectomy, aortic atherosclerotic calcifications, moderate size hiatal hernia.  1 unit PRBC was ordered.  GI was consulted.  TRH consulted for admission for further evaluation and management of symptomatic anemia; rule out upper GI bleed.  Hospital course:  Symptomatic anemia Multiple gastric polyps s/p resection Patient presenting with progressive weakness, fatigue in the setting of dark stools.  Does take iron supplements, also [name] .  Denies NSAID abuse.  Seen by PCP with hemoglobin noted to be 6.3.  On arrival to the ED hemoglobin down to 5.9.  FOBT positive.  CT angiogram abdomen/pelvis negative for identification of a bleeding source.  She was transfused 2 unit PBC's during hospitalization.  GI was consulted and patient underwent EGD with findings of multiple gastric polyps (fundic gland and appearance), a single gastric polyp in the antrum/prepylorus that was prolapsed into the duodenum that was resected with clips placed, no gross lesions in the duodenal bulb, first portion of duodenum and second portion of duodenum.  Hemogram mains stable, 8.3 Thomson discharge.  Continue to hold Eliquis  and may resume on 02/06/2024 per GI.  Continue Protonix  40 mg p.o. twice daily x 2 months followed by once daily thereafter.  Carafate  1 g p.o. 3 times daily AC x 1 month.  GI plans outpatient colonoscopy.  Referral placed  for outpatient IV iron infusion.  Follow-up pathology from polyp biopsy that was pending at time of discharge.   Elevated LFTs: Improving On admission, patient with notable elevated LFTs with AST 135, ALT 65, ALP 72, total  bilirubin less than 0.2.  She is s/p cholecystectomy.  CT abdomen/pelvis with 9 mm common bile duct with no obstructing stone.  Acute hepatitis panel negative.  Suspect elevated LFTs likely secondary to mild shock liver in the setting of anemia as above.  LFTs were monitored on hospitalization with improvement.  Recommend repeat CMP 1 week.   Hypokalemia Repleted   Chronic diastolic congestive heart failure, compensated Paroxysmal atrial fibrillation [name]  HTN Continue metoprolol  succinate 25 mg p.o. daily, losartan  50 mg p.o. daily, furosemide  20 mg p.o. daily.  May resume Eliquis  on 02/06/2024.   HLD Crestor  40 mg p.o. daily   Hypothyroidism Levothyroxine  50 mcg p.o. daily   GERD PPI as above   Discharge Diagnoses:  Principal Problem:   Lower GI bleed Active Problems:   Gastritis without bleeding   Melena   Gastric polyp   Blood loss anemia   Chronic anticoagulation    Discharge Instructions  Discharge Instructions     Amb Referral to Intravenous Iron Therapy   Complete by: As directed    You have been referred to [facility] Infusion team for IV Iron Infusions. The infusion pharmacy team will reach out to you with appointment information.    Primary Diagnosis Code for IV Iron: D63.8 - Anemia in other chronic diseases classified elsewhere   Secondary diagnosis code for IV iron: I50.xx - Heart failure related dx codes   Call MD for:  difficulty breathing, headache or visual disturbances   Complete by: As directed    Call MD for:  extreme fatigue   Complete by: As directed    Call MD for:  persistant dizziness or light-headedness   Complete by: As directed    Call MD for:  persistant nausea and vomiting   Complete by: As directed    Call MD for:  severe uncontrolled pain   Complete by: As directed    Call MD for:  temperature >100.4   Complete by: As directed    Diet - low sodium heart healthy   Complete by: As directed    Increase activity slowly   Complete  by: As directed       Allergies as of 02/04/2024       Reactions   Morphine Sulfate Palpitations   Requip [ropinirole Hcl] Shortness Of Breath, Nausea And Vomiting   Amoxicillin Dermatitis, Rash   Codeine Other (See Comments)   Fatigue   Ezetimibe Other (See Comments)   MYALGIAS and JOINT PAIN   Minocycline Hives   Spironolactone  Rash, Dermatitis   Sulfa Antibiotics Other (See Comments)   Headache (pt states that a blood pressure medicine caused a severe headache but doesn't remember a reaction to sulfa)   Tetracyclines & Related Hives   Zestril [lisinopril] Cough        Medication List     PAUSE taking these medications    apixaban  5 MG Tabs tablet Wait to take this until: February 06, 2024 Morning Commonly known as: Eliquis  Take 1 tablet (5 mg total) by mouth 2 (two) times daily.       STOP taking these medications    buPROPion  150 MG 24 hr tablet Commonly known as: WELLBUTRIN  XL   omeprazole  40 MG capsule Commonly known as: PRILOSEC   Ozempic  (  2 MG/DOSE) 8 MG/3ML Sopn Generic drug: Semaglutide  (2 MG/DOSE)   potassium chloride  SA 20 MEQ tablet Commonly known as: Klor-Con  M20   valACYclovir  1000 MG tablet Commonly known as: VALTREX        TAKE these medications    acetaminophen  500 MG tablet Commonly known as: TYLENOL  Take 500-1,000 mg by mouth every 8 (eight) hours as needed for mild pain (pain score 1-3) (or headaches).   COQ10 PO Take 1 capsule by mouth See admin instructions. Take 1 capsule by mouth once a day ON ALL DAYS CRESTOR /ROSUVASTATIN  is taken   [employer] 12 Hour Nasal Decongestant 120 MG 12 hr tablet Generic drug: pseudoephedrine  TAKE 1 TABLET BY MOUTH TWICE DAILY (EVERY 12 HOURS) FOR HEAD/CHEST CONGESTION & EAR FLUID   cyclobenzaprine  5 MG tablet Commonly known as: FLEXERIL  Take 1 tablet (5 mg total) by mouth 3 (three) times daily as needed for muscle spasms.   DULoxetine  60 MG capsule Commonly known as: CYMBALTA  Take 60 mg by mouth  in the morning. What changed: Another medication with the same name was removed. Continue taking this medication, and follow the directions you see here.   furosemide  20 MG tablet Commonly known as: LASIX  Take 1 tablet (20 mg total) by mouth daily.   gabapentin  300 MG capsule Commonly known as: NEURONTIN  TAKE 1 CAPSULE BY MOUTH TWICE A DAY FOR CHRONIC PAIN What changed:  how much to take how to take this when to take this additional instructions   HYDROcodone -acetaminophen  5-325 MG tablet Commonly known as: NORCO/VICODIN Take 1 tablet by mouth every 6 (six) hours as needed. What changed: reasons to take this   hydrocortisone  cream 1 % Apply to affected area 2 times daily What changed:  how much to take how to take this when to take this reasons to take this additional instructions   hydrOXYzine  50 MG tablet Commonly known as: ATARAX  TAKE 1 TO 2 TABLETS 1 TO 2 HOURS BEFORE BEDTIME AS NEEDED FOR SLEEP What changed: See the new instructions.   ipratropium 0.03 % nasal spray Commonly known as: ATROVENT  Place 2 sprays into the nose every Monday, Tuesday, Wednesday, Thursday, and Friday. What changed: when to take this   ketoconazole  2 % shampoo Commonly known as: NIZORAL  USE AS DIRECTED TWICE A WEEK What changed: See the new instructions.   levothyroxine  50 MCG tablet Commonly known as: SYNTHROID  1 tab daily Please take your thyroid  medication greater than 30 min before breakfast, separated by at least 4 hours  from antacids, calcium , iron, and multivitamins. What changed:  how much to take how to take this when to take this additional instructions   lidocaine  5 % Commonly known as: Lidoderm  Place 1 patch onto the skin daily. Remove & Discard patch within 12 hours or as directed by MD   losartan  50 MG tablet Commonly known as: COZAAR  TAKE 1 TABLET BY MOUTH EVERY DAY   meclizine  25 MG tablet Commonly known as: ANTIVERT  1/2-1 pill up to 3 times daily for  motion sickness/dizziness. Caution, may cause drowsiness. What changed:  how much to take how to take this when to take this reasons to take this additional instructions   metoprolol  succinate 25 MG 24 hr tablet Commonly known as: TOPROL -XL TAKE 1 TABLET (25 MG TOTAL) BY MOUTH DAILY.   naproxen  500 MG tablet Commonly known as: NAPROSYN  Take 1 tablet (500 mg total) by mouth 2 (two) times daily. What changed:  when to take this reasons to take this   nystatin   cream Commonly known as: MYCOSTATIN  APPLY TO AFFECTED AREA TWICE A DAY What changed: See the new instructions.   ondansetron  8 MG tablet Commonly known as: Zofran  Take 1/2 to 1 3 x /day before meals as needed for Nausea What changed:  how much to take how to take this when to take this additional instructions   pantoprazole  40 MG tablet Commonly known as: Protonix  Take 1 tablet (40 mg total) by mouth 2 (two) times daily for 60 days, THEN 1 tablet (40 mg total) daily. Start taking on: February 04, 2024   rosuvastatin  40 MG tablet Commonly known as: CRESTOR  TAKE 1 TABLET BY MOUTH EVERY DAY EXCEPT TAKE 2 TABLETS BY MOUTH ON MONDAYS What changed:  how much to take how to take this when to take this additional instructions   sucralfate  1 g tablet Commonly known as: Carafate  Take 1 tablet (1 g total) by mouth 4 (four) times daily -  with meals and at bedtime.   Vitamin D3 250 MCG (10000 UT) Tabs Take 5,000 Units by mouth daily.        Follow-up Information     Beam, Lamar POUR, MD. Schedule an appointment as soon as possible for a visit in 1 week(s).   Specialty: Family Medicine Contact information: [address] [place] [address] [phone]         Mansouraty, Aloha Raddle., MD Follow up.   Specialties: Gastroenterology, Internal Medicine Contact information: [address] [place] [address] [phone]                Allergies  Allergen Reactions   Morphine Sulfate  Palpitations   Requip [Ropinirole Hcl] Shortness Of Breath and Nausea And Vomiting   Amoxicillin Dermatitis and Rash   Codeine Other (See Comments)    Fatigue    Ezetimibe Other (See Comments)    MYALGIAS and JOINT PAIN   Minocycline Hives   Spironolactone  Rash and Dermatitis   Sulfa Antibiotics Other (See Comments)    Headache (pt states that a blood pressure medicine caused a severe headache but doesn't remember a reaction to sulfa)   Tetracyclines & Related Hives   Zestril [Lisinopril] Cough    Consultations: [facility], Dr. Wilhelmenia   Procedures/Studies: DG Abd Portable 2V Result Date: 02/03/2024 CLINICAL DATA:  Chest and upper abdominal pain.  EGD done today. EXAM: PORTABLE ABDOMEN - 2 VIEW; PORTABLE CHEST - 1 VIEW COMPARISON:  04/13/2022, 01/01/2024. FINDINGS: Chest: Heart size is normal and mediastinal contours within normal limits. A small hiatal hernia is present. There is mild airspace disease in the mid left lung and left lung base. No effusion or pneumothorax is seen. Cervical spinal fusion hardware is present. No acute osseous abnormality. Abdomen: The bowel gas pattern is normal. There is no evidence of free air. Cholecystectomy clips are noted in the right upper quadrant. Lumbar spinal fusion hardware is present. No radio-opaque calculi or other acute radiographic abnormality is seen. IMPRESSION: 1. Mild airspace disease in the mid to lower left lung, possible atelectasis or infiltrate. 2. No evidence of bowel obstruction or free air. 3. Small hiatal hernia. Electronically Signed   By: Leita Birmingham M.D.   On: 02/03/2024 15:14   DG Chest Port 1 View Result Date: 02/03/2024 CLINICAL DATA:  Chest and upper abdominal pain.  EGD done today. EXAM: PORTABLE ABDOMEN - 2 VIEW; PORTABLE CHEST - 1 VIEW COMPARISON:  04/13/2022, 01/01/2024. FINDINGS: Chest: Heart size is normal and mediastinal contours within normal limits.  A small hiatal hernia is present. There is mild  airspace disease in the mid left lung and left lung base. No effusion or pneumothorax is seen. Cervical spinal fusion hardware is present. No acute osseous abnormality. Abdomen: The bowel gas pattern is normal. There is no evidence of free air. Cholecystectomy clips are noted in the right upper quadrant. Lumbar spinal fusion hardware is present. No radio-opaque calculi or other acute radiographic abnormality is seen. IMPRESSION: 1. Mild airspace disease in the mid to lower left lung, possible atelectasis or infiltrate. 2. No evidence of bowel obstruction or free air. 3. Small hiatal hernia. Electronically Signed   By: Leita Birmingham M.D.   On: 02/03/2024 15:14   CT Angio Abd/Pel W and/or Wo Contrast Result Date: 02/02/2024 EXAM: CTA ABDOMEN AND PELVIS WITH AND WITHOUT CONTRAST 02/02/2024 02:08:48 PM TECHNIQUE: CTA images of the abdomen and pelvis without and with intravenous contrast. Three-dimensional MIP/volume rendered formations were performed. Automated exposure control, iterative reconstruction, and/or weight based adjustment of the mA/kV was utilized to reduce the radiation dose to as low as reasonably achievable. COMPARISON: 01/01/2024 CLINICAL HISTORY: FINDINGS: VASCULATURE: AORTA: Aortic atherosclerotic calcifications. No acute finding. No abdominal aortic aneurysm. No dissection. CELIAC TRUNK: No acute finding. No occlusion or significant stenosis. SUPERIOR MESENTERIC ARTERY: No acute finding. No occlusion or significant stenosis. RENAL ARTERIES: No acute finding. No occlusion or significant stenosis. ILIAC ARTERIES: No acute finding. No occlusion or significant stenosis. LIVER: There are a few small scattered low attenuation foci within the liver which appears similar in multiplicity and distribution compared with the previous exam. The largest is in the dome measuring 7 mm, image 16/9. Unchanged from previous study. GALLBLADDER AND BILE DUCTS: Status post cholecystectomy. Common bile duct measures up  to 9 mm proximally, image 92/23. No obstructing stone identified. In the absence of clinical signs or symptoms of biliary obstruction, these findings likely reflect post-cholecystectomy physiology. SPLEEN: The spleen is within normal limits in size and appearance. PANCREAS: Pancreas is normal in size and contour without focal lesion or ductal dilatation. ADRENAL GLANDS: Normal size and morphology bilaterally. No nodule, thickening, or hemorrhage. No periadrenal stranding. KIDNEYS, URETERS AND BLADDER: No stones in the kidneys or ureters. No hydronephrosis. No perinephric or periureteral stranding. Urinary bladder is unremarkable. GI AND BOWEL: Sigmoid colon demonstrates diverticulosis without evidence of diverticulitis. No bowel wall thickening, pericolonic stranding, abscess, or free air. REPRODUCTIVE: Reproductive organs are unremarkable. PERITONEUM AND RETROPERITONEUM: No ascites or free air. LUNG BASE: Similar appearance of small scattered lower lobe lung nodules compared with 01/01/2024. These all measure less than 5 mm. Index nodule in the posteromedial left base measures 3 mm, image 52/7. LYMPH NODES: No lymphadenopathy. BONES AND SOFT TISSUES: Colon status post hardware fixation of the L4-L5 vertebra. Lumbar spondylosis. No acute soft tissue abnormality. IMPRESSION: 1. No acute findings. 2. No signs of active GI bleed. 3. Sigmoid diverticulosis without signs of acute diverticulitis. 4. Small, less than 5 mm lung nodules identified within the lung bases. In a patient who was at low risk, no further follow-up is indicated. If the patient is at increased risk a follow-up CT of the chest without contrast material in 12 months may be considered 5. Status post cholecystectomy. 6. Aortic atherosclerotic calcifications. 7. Moderate size hiatal hernia. Electronically signed by: Birmingham Calk MD 02/02/2024 03:12 PM EDT RP Workstation: [id number]     Subjective:   Discharge Exam: Vitals:   02/04/24 0943  02/04/24 1359  BP: 127/61 124/64  Pulse: 79 89  Resp:  16  Temp:  98.9 F (37.2 C)  SpO2:  99%   Vitals:   02/03/24 2003 02/04/24 0510 02/04/24 0943 02/04/24 1359  BP: 128/60 133/62 127/61 124/64  Pulse: 77 77 79 89  Resp: 20 20  16   Temp: 98.9 F (37.2 C) 99.1 F (37.3 C)  98.9 F (37.2 C)  TempSrc: Oral Oral  Oral  SpO2: 98% 99%  99%  Weight:      Height:        Physical Exam: GEN: NAD, alert and oriented x 3, wd/wn HEENT: NCAT, PERRL, EOMI, sclera clear, MMM PULM: CTAB w/o wheezes/crackles, normal respiratory effort CV: RRR w/o M/G/R GI: abd soft, NTND, NABS, no R/G/M MSK: no peripheral edema, muscle strength globally intact 5/5 bilateral upper/lower extremities NEURO: CN II-XII intact, no focal deficits, sensation to light touch intact PSYCH: normal mood/affect Integumentary: dry/intact, no rashes or wounds    The results of significant diagnostics from this hospitalization (including imaging, microbiology, ancillary and laboratory) are listed below for reference.     Microbiology: No results found for this or any previous visit (from the past 240 hours).   Labs: BNP (last 3 results) No results for input(s): BNP in the last 8760 hours. Basic Metabolic Panel: Recent Labs  Lab 02/02/24 1046 02/03/24 0332 02/03/24 1710 02/04/24 0414  NA 141 142 140 143  K 3.1* 3.1* 3.7 3.6  CL 107 109 109 110  CO2 24 25 21* 26  GLUCOSE 88 104* 101* 96  BUN 21 14 14 10   CREATININE 1.19* 1.05* 1.00 1.01*  CALCIUM  8.8* 8.6* 8.5* 8.7*  MG  --   --   --  2.4   Liver Function Tests: Recent Labs  Lab 02/02/24 1046 02/03/24 0332 02/03/24 1710 02/04/24 0414  AST 135* 104* 97* 86*  ALT 65* 57* 57* 53*  ALKPHOS 72 67 74 67  BILITOT <0.2 0.4 0.4 0.3  PROT 6.4* 5.9* 6.1* 5.9*  ALBUMIN  3.6 3.3* 3.5 3.3*   No results for input(s): LIPASE, AMYLASE in the last 168 hours. No results for input(s): AMMONIA in the last 168 hours. CBC: Recent Labs  Lab 02/02/24 1046  02/02/24 1919 02/03/24 0332 02/03/24 1045 02/03/24 1710 02/04/24 0414 02/04/24 1339  WBC 7.4  --  7.6  --  7.8 7.4  --   HGB 5.9*   < > 7.9* 8.0* 8.3* 7.8* 8.3*  HCT 19.8*   < > 25.1* 25.6* 28.4* 26.1* 27.2*  MCV 93.4  --  91.3  --  97.3 93.9  --   PLT 340  --  302  --  299 261  --    < > = values in this interval not displayed.   Cardiac Enzymes: No results for input(s): CKTOTAL, CKMB, CKMBINDEX, TROPONINI in the last 168 hours. BNP: Invalid input(s): POCBNP CBG: No results for input(s): GLUCAP in the last 168 hours. D-Dimer No results for input(s): DDIMER in the last 72 hours. Hgb A1c No results for input(s): HGBA1C in the last 72 hours. Lipid Profile No results for input(s): CHOL, HDL, LDLCALC, TRIG, CHOLHDL, LDLDIRECT in the last 72 hours. Thyroid  function studies No results for input(s): TSH, T4TOTAL, T3FREE, THYROIDAB in the last 72 hours.  Invalid input(s): FREET3 Anemia work up No results for input(s): VITAMINB12, FOLATE, FERRITIN, TIBC, IRON, RETICCTPCT in the last 72 hours. Urinalysis    Component Value Date/Time   COLORURINE COLORLESS (A) 02/02/2024 2318   APPEARANCEUR CLEAR 02/02/2024 2318   LABSPEC 1.010 02/02/2024 2318  PHURINE 6.0 02/02/2024 2318   GLUCOSEU NEGATIVE 02/02/2024 2318   HGBUR LARGE (A) 02/02/2024 2318   BILIRUBINUR NEGATIVE 02/02/2024 2318   KETONESUR NEGATIVE 02/02/2024 2318   PROTEINUR NEGATIVE 02/02/2024 2318   UROBILINOGEN 0.2 06/16/2014 1523   NITRITE NEGATIVE 02/02/2024 2318   LEUKOCYTESUR NEGATIVE 02/02/2024 2318   Sepsis Labs Recent Labs  Lab 02/02/24 1046 02/03/24 0332 02/03/24 1710 02/04/24 0414  WBC 7.4 7.6 7.8 7.4   Microbiology No results found for this or any previous visit (from the past 240 hours).   Time coordinating discharge: Over 30 minutes  SIGNED:   Camellia PARAS Vila Dory, DO  [facility] 02/04/2024, 2:51 PM

## 2024-02-04 NOTE — Progress Notes (Addendum)
 PROGRESS NOTE    [name] [name]  FMW:[id number] DOB: [birth year] DOA: 02/02/2024 PCP: Dorcus Lamar POUR, MD    Brief Narrative:   [name] [name] is a 78 y.o. female with past medical history significant for chronic diastolic congestive heart failure, paroxysmal atrial fibrillation [name] , HTN, HLD, hypothyroidism, GERD who presented to [facility] ED on 02/02/2024 as directed by PCP for lab abnormality.  Reported outpatient hemoglobin of 6.3.  Patient reports 3-4 weeks ago, vague abdominal pain with bloating and the stools became dark.  Also endorsed nausea without vomiting.  She reports did not think of the dark stools of concern since she takes iron.  Also becoming more weak; and sought further care by her PCP.  Labs revealing significant anemia and patient was directed to the ED for further evaluation.  In the ED, temperature 98.0 F, HR 70, RR 17, BP 140/72, SpO2 100% on room air.  WBC 7.4, hemoglobin 5.9, platelet count 340.  Sodium 141, potassium 3.1, chloride 107, CO2 24, glucose 88, BUN 21, creat 1.19.  AST 135, ALT 65, total Ruben 0.2.  Tylenol  level less than 10.  Urinalysis with large hemoglobin, negative leukocytes/nitrite, rare bacteria, 0-5 RBCs, 0-5 WBCs.  CT angiogram abdomen/pelvis with contrast with no acute findings, no signs of active GI bleed, noted sigmoid diverticulosis without signs of acute diverticulitis, small less than 5 mm lung nodules within lung bases, status post cholecystectomy, aortic atherosclerotic calcifications, moderate size hiatal hernia.  1 unit PRBC was ordered.  GI was consulted.  TRH consulted for admission for further evaluation and management of symptomatic anemia; rule out upper GI bleed.  Assessment & Plan:   Symptomatic anemia Multiple gastric polyps s/p resection Patient presenting with progressive weakness, fatigue in the setting of dark stools.  Does take iron supplements, also [name] .  Denies NSAID abuse.  Seen by PCP with  hemoglobin noted to be 6.3.  On arrival to the ED hemoglobin down to 5.9.  FOBT positive.  CT angiogram abdomen/pelvis negative for identification of a bleeding source.  GI was consulted and patient underwent EGD with findings of multiple gastric polyps (fundic gland and appearance), a single gastric polyp in the antrum/prepylorus that was prolapsed into the duodenum that was resected with clips placed, no gross lesions in the duodenal bulb, first portion of duodenum and second portion of duodenum. -- [facility] following, appreciate assistance -- Hgb 5.9>7.0>8.0>7.9>8.0>8.3>7.8, stable following transfusion -- s/p 2u pRBC on 10/24 -- Pathology pending -- Holding home Eliquis ; holding for 72 hours following EGD -- Protonix  40 mg PO BID -- Carafate  1 g p.o. twice daily -- Continue to monitor hemoglobin -- GI plans outpatient colonoscopy -- Pharmacy referral for outpatient IV iron infusion as requested by GI  Elevated LFTs: Improving On admission, patient with notable elevated LFTs with AST 135, ALT 65, ALP 72, total bilirubin less than 0.2.  She is s/p cholecystectomy.  CT abdomen/pelvis with 9 mm common bile duct with no obstructing stone.  Acute hepatitis panel negative.  Suspect elevated LFTs likely secondary to mild shock liver in the setting of anemia as above. -- AST 135>104>86 -- ALT 65>57>53 -- Tbili <0.2>0.4>0.3 -- CMP in am  Hypokalemia Repleted, potassium 3.6 this morning --Repeat electrolytes in a.m.  Chronic diastolic congestive heart failure, compensated Paroxysmal atrial fibrillation [name]  HTN -- Holding Eliquis  as above -- Metoprolol  succinate 25 mg p.o. daily -- Losartan  50 mg p.o. daily  HLD -- Crestor  40 mg p.o. daily  Hypothyroidism --  Levothyroxine  50 mcg p.o. daily  GERD -- On PPI as above     DVT prophylaxis: SCDs Start: 02/02/24 1538    Code Status: Full Code Family Communication: Updated patient's son at bedside  Disposition Plan:  Level of  care: Progressive Status is: Inpatient Remains inpatient appropriate because: EGD    Consultants:   [facility]   Procedures:  EGD  Antimicrobials:  None   Subjective: Patient seen examined bedside, lying in bed.  Waiting for EGD planned for this afternoon.  Hemoglobin stable, 8.0 this morning.  Transfused 2 unit PRBCs yesterday.  No complaints this morning.  Fatigue/weakness much improved.  Denies headache, no visual changes, no chest pain, no palpitations, no shortness of breath, no abdominal pain, no fever/chills/night sweats, no nausea/vomiting/diarrhea, no focal weakness, no paresthesias.  No acute events overnight per nursing staff.  Objective: Vitals:   02/03/24 1554 02/03/24 2003 02/04/24 0510 02/04/24 0943  BP: (!) 152/77 128/60 133/62 127/61  Pulse: 75 77 77 79  Resp:  20 20   Temp: 98.7 F (37.1 C) 98.9 F (37.2 C) 99.1 F (37.3 C)   TempSrc: Oral Oral Oral   SpO2: 97% 98% 99%   Weight:      Height:        Intake/Output Summary (Last 24 hours) at 02/04/2024 1139 Last data filed at 02/04/2024 1000 Gross per 24 hour  Intake 1530 ml  Output --  Net 1530 ml   Filed Weights   02/02/24 1731 02/03/24 1154  Weight: 99.8 kg 99.8 kg    Examination:  Physical Exam: GEN: NAD, alert and oriented x 3, obese HEENT: NCAT, PERRL, EOMI, sclera clear, MMM PULM: CTAB w/o wheezes/crackles, normal respiratory effort, on room air CV: RRR GI: abd soft, NTND, + BS MSK: no peripheral edema, moves all extremity dependently NEURO: No focal neurological deficit PSYCH: normal mood/affect Integumentary: dry/intact, no rashes or wounds    Data Reviewed: I have personally reviewed following labs and imaging studies  CBC: Recent Labs  Lab 02/02/24 1046 02/02/24 1919 02/03/24 0328 02/03/24 0332 02/03/24 1045 02/03/24 1710 02/04/24 0414  WBC 7.4  --   --  7.6  --  7.8 7.4  HGB 5.9*   < > 8.0* 7.9* 8.0* 8.3* 7.8*  HCT 19.8*   < > 25.7* 25.1* 25.6* 28.4*  26.1*  MCV 93.4  --   --  91.3  --  97.3 93.9  PLT 340  --   --  302  --  299 261   < > = values in this interval not displayed.   Basic Metabolic Panel: Recent Labs  Lab 02/02/24 1046 02/03/24 0332 02/03/24 1710 02/04/24 0414  NA 141 142 140 143  K 3.1* 3.1* 3.7 3.6  CL 107 109 109 110  CO2 24 25 21* 26  GLUCOSE 88 104* 101* 96  BUN 21 14 14 10   CREATININE 1.19* 1.05* 1.00 1.01*  CALCIUM  8.8* 8.6* 8.5* 8.7*  MG  --   --   --  2.4   GFR: Estimated Creatinine Clearance: 54.6 mL/min (A) (by C-G formula based on SCr of 1.01 mg/dL (H)). Liver Function Tests: Recent Labs  Lab 02/02/24 1046 02/03/24 0332 02/03/24 1710 02/04/24 0414  AST 135* 104* 97* 86*  ALT 65* 57* 57* 53*  ALKPHOS 72 67 74 67  BILITOT <0.2 0.4 0.4 0.3  PROT 6.4* 5.9* 6.1* 5.9*  ALBUMIN  3.6 3.3* 3.5 3.3*   No results for input(s): LIPASE, AMYLASE in the last 168  hours. No results for input(s): AMMONIA in the last 168 hours. Coagulation Profile: No results for input(s): INR, PROTIME in the last 168 hours. Cardiac Enzymes: No results for input(s): CKTOTAL, CKMB, CKMBINDEX, TROPONINI in the last 168 hours. BNP (last 3 results) No results for input(s): PROBNP in the last 8760 hours. HbA1C: No results for input(s): HGBA1C in the last 72 hours. CBG: No results for input(s): GLUCAP in the last 168 hours. Lipid Profile: No results for input(s): CHOL, HDL, LDLCALC, TRIG, CHOLHDL, LDLDIRECT in the last 72 hours. Thyroid  Function Tests: No results for input(s): TSH, T4TOTAL, FREET4, T3FREE, THYROIDAB in the last 72 hours. Anemia Panel: No results for input(s): VITAMINB12, FOLATE, FERRITIN, TIBC, IRON, RETICCTPCT in the last 72 hours. Sepsis Labs: No results for input(s): PROCALCITON, LATICACIDVEN in the last 168 hours.  No results found for this or any previous visit (from the past 240 hours).       Radiology Studies: DG Abd Portable  2V Result Date: 02/03/2024 CLINICAL DATA:  Chest and upper abdominal pain.  EGD done today. EXAM: PORTABLE ABDOMEN - 2 VIEW; PORTABLE CHEST - 1 VIEW COMPARISON:  04/13/2022, 01/01/2024. FINDINGS: Chest: Heart size is normal and mediastinal contours within normal limits. A small hiatal hernia is present. There is mild airspace disease in the mid left lung and left lung base. No effusion or pneumothorax is seen. Cervical spinal fusion hardware is present. No acute osseous abnormality. Abdomen: The bowel gas pattern is normal. There is no evidence of free air. Cholecystectomy clips are noted in the right upper quadrant. Lumbar spinal fusion hardware is present. No radio-opaque calculi or other acute radiographic abnormality is seen. IMPRESSION: 1. Mild airspace disease in the mid to lower left lung, possible atelectasis or infiltrate. 2. No evidence of bowel obstruction or free air. 3. Small hiatal hernia. Electronically Signed   By: Leita Birmingham M.D.   On: 02/03/2024 15:14   DG Chest Port 1 View Result Date: 02/03/2024 CLINICAL DATA:  Chest and upper abdominal pain.  EGD done today. EXAM: PORTABLE ABDOMEN - 2 VIEW; PORTABLE CHEST - 1 VIEW COMPARISON:  04/13/2022, 01/01/2024. FINDINGS: Chest: Heart size is normal and mediastinal contours within normal limits. A small hiatal hernia is present. There is mild airspace disease in the mid left lung and left lung base. No effusion or pneumothorax is seen. Cervical spinal fusion hardware is present. No acute osseous abnormality. Abdomen: The bowel gas pattern is normal. There is no evidence of free air. Cholecystectomy clips are noted in the right upper quadrant. Lumbar spinal fusion hardware is present. No radio-opaque calculi or other acute radiographic abnormality is seen. IMPRESSION: 1. Mild airspace disease in the mid to lower left lung, possible atelectasis or infiltrate. 2. No evidence of bowel obstruction or free air. 3. Small hiatal hernia. Electronically  Signed   By: Leita Birmingham M.D.   On: 02/03/2024 15:14   CT Angio Abd/Pel W and/or Wo Contrast Result Date: 02/02/2024 EXAM: CTA ABDOMEN AND PELVIS WITH AND WITHOUT CONTRAST 02/02/2024 02:08:48 PM TECHNIQUE: CTA images of the abdomen and pelvis without and with intravenous contrast. Three-dimensional MIP/volume rendered formations were performed. Automated exposure control, iterative reconstruction, and/or weight based adjustment of the mA/kV was utilized to reduce the radiation dose to as low as reasonably achievable. COMPARISON: 01/01/2024 CLINICAL HISTORY: FINDINGS: VASCULATURE: AORTA: Aortic atherosclerotic calcifications. No acute finding. No abdominal aortic aneurysm. No dissection. CELIAC TRUNK: No acute finding. No occlusion or significant stenosis. SUPERIOR MESENTERIC ARTERY: No acute finding. No occlusion  or significant stenosis. RENAL ARTERIES: No acute finding. No occlusion or significant stenosis. ILIAC ARTERIES: No acute finding. No occlusion or significant stenosis. LIVER: There are a few small scattered low attenuation foci within the liver which appears similar in multiplicity and distribution compared with the previous exam. The largest is in the dome measuring 7 mm, image 16/9. Unchanged from previous study. GALLBLADDER AND BILE DUCTS: Status post cholecystectomy. Common bile duct measures up to 9 mm proximally, image 92/23. No obstructing stone identified. In the absence of clinical signs or symptoms of biliary obstruction, these findings likely reflect post-cholecystectomy physiology. SPLEEN: The spleen is within normal limits in size and appearance. PANCREAS: Pancreas is normal in size and contour without focal lesion or ductal dilatation. ADRENAL GLANDS: Normal size and morphology bilaterally. No nodule, thickening, or hemorrhage. No periadrenal stranding. KIDNEYS, URETERS AND BLADDER: No stones in the kidneys or ureters. No hydronephrosis. No perinephric or periureteral stranding. Urinary  bladder is unremarkable. GI AND BOWEL: Sigmoid colon demonstrates diverticulosis without evidence of diverticulitis. No bowel wall thickening, pericolonic stranding, abscess, or free air. REPRODUCTIVE: Reproductive organs are unremarkable. PERITONEUM AND RETROPERITONEUM: No ascites or free air. LUNG BASE: Similar appearance of small scattered lower lobe lung nodules compared with 01/01/2024. These all measure less than 5 mm. Index nodule in the posteromedial left base measures 3 mm, image 52/7. LYMPH NODES: No lymphadenopathy. BONES AND SOFT TISSUES: Colon status post hardware fixation of the L4-L5 vertebra. Lumbar spondylosis. No acute soft tissue abnormality. IMPRESSION: 1. No acute findings. 2. No signs of active GI bleed. 3. Sigmoid diverticulosis without signs of acute diverticulitis. 4. Small, less than 5 mm lung nodules identified within the lung bases. In a patient who was at low risk, no further follow-up is indicated. If the patient is at increased risk a follow-up CT of the chest without contrast material in 12 months may be considered 5. Status post cholecystectomy. 6. Aortic atherosclerotic calcifications. 7. Moderate size hiatal hernia. Electronically signed by: Waddell Calk MD 02/02/2024 03:12 PM EDT RP Workstation: [id number]        Scheduled Meds:  gabapentin   300 mg Oral BID   levothyroxine   50 mcg Oral Q0600   losartan   50 mg Oral Daily   metoprolol  succinate  25 mg Oral Daily   pantoprazole   40 mg Oral BID   rosuvastatin   40 mg Oral QHS   sucralfate   1 g Oral BID   Continuous Infusions:     LOS: 2 days    Time spent: 47 minutes spent on 02/04/2024 caring for this patient face-to-face including chart review, ordering labs/tests, documenting, discussion with nursing staff, consultants, updating family and interview/physical exam    Camellia PARAS Drayton Tieu, DO [facility] Available via Epic secure chat 7am-7pm After these hours, please refer to coverage provider listed  on [email or web address] 02/04/2024, 11:39 AM

## 2024-02-05 ENCOUNTER — Telehealth: Payer: Self-pay | Admitting: Pharmacy Technician

## 2024-02-05 ENCOUNTER — Telehealth (HOSPITAL_COMMUNITY): Payer: Self-pay

## 2024-02-05 ENCOUNTER — Telehealth (HOSPITAL_COMMUNITY): Payer: Self-pay | Admitting: Internal Medicine

## 2024-02-05 ENCOUNTER — Encounter (HOSPITAL_COMMUNITY): Payer: Self-pay | Admitting: Gastroenterology

## 2024-02-05 ENCOUNTER — Telehealth: Payer: Self-pay

## 2024-02-05 DIAGNOSIS — D509 Iron deficiency anemia, unspecified: Secondary | ICD-10-CM | POA: Insufficient documentation

## 2024-02-05 DIAGNOSIS — K922 Gastrointestinal hemorrhage, unspecified: Secondary | ICD-10-CM

## 2024-02-05 NOTE — Telephone Encounter (Signed)
-----   Message from [facility] sent at 02/04/2024  1:30 PM EDT ----- Regarding: Follow-up [name] [name], This patient needs a CBC under my name on Thursday of this week.  Bari, This patient needs follow-up with APP or myself in 2 months. Thanks. GM

## 2024-02-05 NOTE — Telephone Encounter (Signed)
 Auth Submission: NO AUTH NEEDED Site of care: Site of care: [facility] Payer: [employer] MEDICARE Medication & CPT/J Code(s) submitted: Feraheme (ferumoxytol) [id number] Diagnosis Code: D50.9 Route of submission (phone, fax, portal):  Phone # Fax # Auth type: Buy/Bill PB Units/visits requested: X2 DOSES Reference number:  Approval from: 02/05/24 to 04/10/24

## 2024-02-05 NOTE — Telephone Encounter (Signed)
Lab order has been entered as requested.  

## 2024-02-05 NOTE — Telephone Encounter (Signed)
 Auth Submission: NO AUTH NEEDED Site of care: Site of care: [facility] Payer: [employer] Medicare Medication & CPT/J Code(s) submitted: Feraheme (ferumoxytol) [id number] Diagnosis Code: D50.9, D50.0 Route of submission (phone, fax, portal): portal Phone # Fax # Auth type: Buy/Bill HB Units/visits requested: 510mg  x 2 doses Reference number: [id number] Approval from: 02/05/24 to 04/10/24

## 2024-02-05 NOTE — Telephone Encounter (Signed)
 Patient referred to infusion pharmacy team for ambulatory infusion of IV iron.  Insurance - [employer] Medicare Site of care - Site of care: [facility] Dx code - D50.0/D50.9  IV Iron Therapy - Feraheme 510 mg IV x 2  Infusion appointments - Scheduling team will schedule patient as soon as possible.    [name] [name] D. Olivette Beckmann, PharmD

## 2024-02-06 ENCOUNTER — Ambulatory Visit: Payer: Self-pay | Admitting: Gastroenterology

## 2024-02-06 LAB — TYPE AND SCREEN
ABO/RH(D): A POS
Antibody Screen: NEGATIVE
Unit division: 0
Unit division: 0
Unit division: 0

## 2024-02-06 LAB — BPAM RBC
Blood Product Expiration Date: 202511142359
Blood Product Expiration Date: 202511142359
Blood Product Expiration Date: 202511152359
ISSUE DATE / TIME: 202510241412
ISSUE DATE / TIME: 202510242248
Unit Type and Rh: 6200
Unit Type and Rh: 6200
Unit Type and Rh: 6200

## 2024-02-06 LAB — SURGICAL PATHOLOGY

## 2024-02-08 ENCOUNTER — Ambulatory Visit (HOSPITAL_COMMUNITY)
Admission: RE | Admit: 2024-02-08 | Discharge: 2024-02-08 | Disposition: A | Discharge status: Home / Self Care | Source: Ambulatory Visit | Attending: Cardiology | Admitting: Cardiology

## 2024-02-08 VITALS — BP 132/69 | HR 70 | Temp 97.7°F | Resp 16

## 2024-02-08 DIAGNOSIS — D509 Iron deficiency anemia, unspecified: Secondary | ICD-10-CM | POA: Insufficient documentation

## 2024-02-08 DIAGNOSIS — D500 Iron deficiency anemia secondary to blood loss (chronic): Secondary | ICD-10-CM | POA: Diagnosis present

## 2024-02-08 MED ORDER — SODIUM CHLORIDE 0.9 % IV SOLN
510.0000 mg | Freq: Once | INTRAVENOUS | Status: AC
  Administered 2024-02-08: 510 mg via INTRAVENOUS
  Filled 2024-02-08: qty 17

## 2024-02-08 MED ORDER — SODIUM CHLORIDE 0.9 % IV SOLN: INTRAVENOUS | Status: DC | PRN

## 2024-02-08 NOTE — Progress Notes (Signed)
 PATIENT CARE CENTER NOTE   Diagnosis: Iron deficiency anemia, unspecified iron deficiency anemia type [D50.9]    Provider: Austria, Eric, DO   Procedure: Feraheme infusion    Note: Patient received Feraheme 510 mg infusion (dose # 1 of 2) via PIV. Patient tolerated infusion well with no adverse reaction. Observed patient for 30 minutes post infusion. Vital signs stable. Discharge instructions given. Patient to come back in 1 week for next infusion and will schedule next appointment at the front desk. Patient alert, oriented and ambulatory at discharge.

## 2024-02-15 ENCOUNTER — Ambulatory Visit (HOSPITAL_COMMUNITY)
Admission: RE | Admit: 2024-02-15 | Discharge: 2024-02-15 | Disposition: A | Discharge status: Home / Self Care | Source: Ambulatory Visit | Attending: Family Medicine | Admitting: Family Medicine

## 2024-02-15 VITALS — BP 151/72 | HR 66 | Temp 97.6°F | Resp 18

## 2024-02-15 DIAGNOSIS — D509 Iron deficiency anemia, unspecified: Secondary | ICD-10-CM | POA: Insufficient documentation

## 2024-02-15 DIAGNOSIS — D500 Iron deficiency anemia secondary to blood loss (chronic): Secondary | ICD-10-CM | POA: Diagnosis present

## 2024-02-15 MED ORDER — SODIUM CHLORIDE 0.9 % IV SOLN: INTRAVENOUS | Status: DC | PRN

## 2024-02-15 MED ORDER — SODIUM CHLORIDE 0.9 % IV SOLN
510.0000 mg | Freq: Once | INTRAVENOUS | Status: AC
  Administered 2024-02-15: 510 mg via INTRAVENOUS
  Filled 2024-02-15: qty 17

## 2024-02-15 NOTE — Progress Notes (Signed)
 PATIENT CARE CENTER NOTE:  Diagnosis: Iron deficiency anemia, unspecified iron deficiency anemia type [D50.9]   Provider: Austria, Eric, DO  Procedure: Feraheme Infusion  Patient received Feraheme 510 mg infusion (dose # 2 of 2) via PIV. Patient tolerated infusion well with no adverse reaction. Observed patient for 30 minutes post infusion. Vital signs stable. Discharge instructions declined. Patient alert, oriented and ambulatory at discharge.

## 2024-03-04 ENCOUNTER — Ambulatory Visit: Admitting: Podiatry

## 2024-03-04 ENCOUNTER — Encounter: Payer: Self-pay | Admitting: Podiatry

## 2024-03-04 DIAGNOSIS — L600 Ingrowing nail: Secondary | ICD-10-CM | POA: Diagnosis not present

## 2024-03-04 NOTE — Patient Instructions (Signed)

## 2024-03-05 NOTE — Progress Notes (Signed)
 Subjective:   Patient ID: [name] [name], female   DOB: 78 y.o.   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  I infiltrated each big toe 60 mg Xylocaine  Marcaine  mixture sterile prep done using sterile instrumentation removed but nail tissue exposed matrix applied phenol for applications 30 seconds followed by alcohol lavage sterile dressing gave instructions on soaks wear dressing 24 hours take off earlier if throbbing were to occur and call with questions which may come up

## 2024-03-08 ENCOUNTER — Other Ambulatory Visit: Payer: Self-pay | Admitting: Cardiology

## 2024-03-08 ENCOUNTER — Other Ambulatory Visit: Payer: Self-pay | Admitting: Nurse Practitioner

## 2024-03-19 ENCOUNTER — Encounter: Payer: Medicare Other | Admitting: Nurse Practitioner

## 2024-03-22 ENCOUNTER — Other Ambulatory Visit

## 2024-03-22 ENCOUNTER — Encounter: Payer: Self-pay | Admitting: Gastroenterology

## 2024-03-22 ENCOUNTER — Ambulatory Visit: Admitting: Gastroenterology

## 2024-03-22 VITALS — BP 120/64 | HR 77 | Ht 65.0 in | Wt 214.2 lb

## 2024-03-22 DIAGNOSIS — K449 Diaphragmatic hernia without obstruction or gangrene: Secondary | ICD-10-CM

## 2024-03-22 DIAGNOSIS — R748 Abnormal levels of other serum enzymes: Secondary | ICD-10-CM

## 2024-03-22 DIAGNOSIS — K219 Gastro-esophageal reflux disease without esophagitis: Secondary | ICD-10-CM

## 2024-03-22 DIAGNOSIS — K921 Melena: Secondary | ICD-10-CM | POA: Diagnosis not present

## 2024-03-22 DIAGNOSIS — R194 Change in bowel habit: Secondary | ICD-10-CM

## 2024-03-22 DIAGNOSIS — K317 Polyp of stomach and duodenum: Secondary | ICD-10-CM

## 2024-03-22 DIAGNOSIS — D509 Iron deficiency anemia, unspecified: Secondary | ICD-10-CM | POA: Diagnosis not present

## 2024-03-22 LAB — CBC WITH DIFFERENTIAL/PLATELET
Basophils Absolute: 0 K/uL (ref 0.0–0.1)
Basophils Relative: 0.2 % (ref 0.0–3.0)
Eosinophils Absolute: 0.4 K/uL (ref 0.0–0.7)
Eosinophils Relative: 5 % (ref 0.0–5.0)
HCT: 35.7 % — ABNORMAL LOW (ref 36.0–46.0)
Hemoglobin: 11.9 g/dL — ABNORMAL LOW (ref 12.0–15.0)
Lymphocytes Relative: 16.5 % (ref 12.0–46.0)
Lymphs Abs: 1.2 K/uL (ref 0.7–4.0)
MCHC: 33.3 g/dL (ref 30.0–36.0)
MCV: 88.1 fl (ref 78.0–100.0)
Monocytes Absolute: 0.7 K/uL (ref 0.1–1.0)
Monocytes Relative: 9.7 % (ref 3.0–12.0)
Neutro Abs: 5 K/uL (ref 1.4–7.7)
Neutrophils Relative %: 68.6 % (ref 43.0–77.0)
Platelets: 212 K/uL (ref 150.0–400.0)
RBC: 4.05 Mil/uL (ref 3.87–5.11)
RDW: 17.2 % — ABNORMAL HIGH (ref 11.5–15.5)
WBC: 7.3 K/uL (ref 4.0–10.5)

## 2024-03-22 LAB — COMPREHENSIVE METABOLIC PANEL WITH GFR
ALT: 10 U/L (ref 0–35)
AST: 16 U/L (ref 0–37)
Albumin: 4.4 g/dL (ref 3.5–5.2)
Alkaline Phosphatase: 73 U/L (ref 39–117)
BUN: 14 mg/dL (ref 6–23)
CO2: 28 meq/L (ref 19–32)
Calcium: 9.5 mg/dL (ref 8.4–10.5)
Chloride: 101 meq/L (ref 96–112)
Creatinine, Ser: 0.95 mg/dL (ref 0.40–1.20)
GFR: 57.6 mL/min — ABNORMAL LOW (ref >60.00)
Glucose, Bld: 87 mg/dL (ref 70–99)
Potassium: 3.3 meq/L — ABNORMAL LOW (ref 3.5–5.1)
Sodium: 140 meq/L (ref 135–145)
Total Bilirubin: 0.4 mg/dL (ref 0.2–1.2)
Total Protein: 7.3 g/dL (ref 6.0–8.3)

## 2024-03-22 LAB — IBC + FERRITIN
Ferritin: 96.9 ng/mL (ref 10.0–291.0)
Iron: 58 ug/dL (ref 42–145)
Saturation Ratios: 15.2 % — ABNORMAL LOW (ref 20.0–50.0)
TIBC: 380.8 ug/dL (ref 250.0–450.0)
Transferrin: 272 mg/dL (ref 212.0–360.0)

## 2024-03-22 NOTE — Patient Instructions (Addendum)
 Your provider has requested that you go to the basement level for lab work before leaving today. Press B on the elevator. The lab is located at the first door on the left as you exit the elevator.  Please purchase the following medications over the counter and take as directed: Start Benefiber 1 tablespoon once daily.   Increase water intake.   Follow-up with your Primary Care Physician regarding Chest Wall Pain.   Continue Pantoprazole  as instructed.   Follow-up in 6-8 weeks -05/03/24 at  2:30 pm   _______________________________________________________  If your blood pressure at your visit was 140/90 or greater, please contact your primary care physician to follow up on this.  _______________________________________________________  If you are age 78 or older, your body mass index should be between 23-30. Your Body mass index is 35.65 kg/m. If this is out of the aforementioned range listed, please consider follow up with your Primary Care Provider.  If you are age 39 or younger, your body mass index should be between 19-25. Your Body mass index is 35.65 kg/m. If this is out of the aformentioned range listed, please consider follow up with your Primary Care Provider.   ________________________________________________________  The [facility] providers would like to encourage you to use MYCHART to communicate with providers for non-urgent requests or questions.  Due to long hold times on the telephone, sending your provider a message by [facility] may be a faster and more efficient way to get a response.  Please allow 48 business hours for a response.  Please remember that this is for non-urgent requests.  _______________________________________________________  [facility] is using a team-based approach to care.  Your team is made up of your doctor and two to three APPS. Our APPS (Nurse Practitioners and Physician Assistants) work with your physician to ensure care continuity  for you. They are fully qualified to address your health concerns and develop a treatment plan. They communicate directly with your gastroenterologist to care for you. Seeing the Advanced Practice Practitioners on your physician's team can help you by facilitating care more promptly, often allowing for earlier appointments, access to diagnostic testing, procedures, and other specialty referrals.    Thank you for choosing me and [facility].  Camie Furbish, PA-C

## 2024-03-22 NOTE — Progress Notes (Signed)
 [name] [name] [id number] 03-11-1946   Chief Complaint: Abdominal pain, alternating diarrhea and constipation  Referring Provider: Beam, Lamar POUR, MD Primary GI MD: Dr. Wilhelmenia  HPI: [name] [name] is a 78 y.o. female with past medical history of anemia, anxiety/depression, arthritis, CHF, GERD, previous pancreatitis, PUD, HLD, HTN, hypothyroidism, left breast cancer 2015, obesity, paroxysmal A-fib [name] , hysterectomy, cholecystectomy who presents today for hospital follow-up.    Patient was admitted to the hospital 02/02/2024 to 02/04/2024 for GI bleed.  She presented to the ED as directed by PCP for lab abnormality with outpatient hemoglobin of 6.3.  3 to 4 weeks prior, endorsed having vague abdominal pain with bloating and dark stools, nausea without vomiting.  She was on an iron supplement so did not think much of the dark stools at that time.  Had progressively become weaker and sought further care with PCP.  Vital stable in the ED.  Found to have hemoglobin of 5.9, elevated liver enzymes.  FOBT positive.  Denied NSAID abuse.  CTA abdomen/pelvis showed no acute findings, no signs of active GI bleeding, noted to have sigmoid diverticulosis without evidence of acute diverticulitis, moderate size hiatal hernia.  1 unit PRBCs was ordered, GI was consulted.  Patient underwent EGD with findings of multiple gastric polyps, a single gastric polyp in the antrum/prepylorus that was prolapsed into the duodenum which was resected with clips placed.  She was advised to continue Protonix  40 mg twice daily for 2 months followed by once daily thereafter, Carafate  3 times daily for a month.  Plan was for outpatient colonoscopy.  Referral was placed for outpatient IV iron infusion.  Elevated LFTs thought secondary to mild shock liver in the setting of anemia.  There was improvement during hospitalization and advised have repeat CMP outpatient.  Labs 02/14/2024: mild elevation in ALT to 43 and  otherwise normal CMP  Labs 03/15/2024: Hemoglobin 12, normal MCV  On follow-up with PCP on 03/18/2024.  Endorsed continued GI issues.  Urgency of bowel movements with near incontinence, frequent flatulence, no melena or BRBPR.  Improved energy.   Discussed the use of AI scribe software for clinical note transcription with the patient, who gave verbal consent to proceed.  History of Present Illness [name] [name] is a 78 year old female who presents for follow-up after recent hospitalization for gastrointestinal bleeding.  Gastrointestinal bleeding and polyp removal - Hospitalized in October for gastrointestinal bleeding - Upper endoscopy revealed a large gastric polyp, which was removed - Receiving iron infusions; two completed with improved blood counts - No further signs of bleeding, including hematochezia or melena  Altered bowel habits - New onset of alternating constipation and diarrhea since hospitalization - Symptoms described as urgent and 'very new' - Bowel movements every two days, formed but loose stools - Prior to hospitalization, had daily bowel movements that were dark, thought due to iron supplementation - No use of laxatives; previously used a stool softener  Abdominal pain - New intermittent left upper abdominal pain since hospitalization - Pain lacks a clear pattern - Additional pain over ribs, which improves with Tylenol   Gastroesophageal reflux and gastric healing - Currently taking pantoprazole  twice daily for reflux and gastric healing post-polyp removal - No reflux or heartburn symptoms - Taking Carafate  for stomach healing   Previous GI Procedures/Imaging   EGD 02/03/2024 - No gross lesions in the entire esophagus. Z- line irregular, 35 cm from the incisors.  - 5 cm hiatal hernia.  - Erythematous mucosa in  the stomach. Biopsied.  - Multiple gastric polyps ( fundic gland in appearance) .  - A single gastric polyp in antrum/ prepylorus that was  prolapsed into the duodenum ( > 50 mm in size and semipedunculated and broad- based) . Resected and retrieved with polyloop aid ( see above for full details) . Clips were placed. Clip manufacturer: [employer]. Injected Purastat for additional hemostasis.  - No gross lesions in the duodenal bulb, in the first portion of the duodenum and in the second portion of the duodenum. Path: FINAL MICROSCOPIC DIAGNOSIS:   A. GASTRIC, RANDOM, BIOPSY:  - Benign gastric mucosa with mild reactive changes.  - No evidence of H. pylori on HE stain.   B. GASTRIC, EMR, POLYPECTOMY:  - Gastric hyperplastic polyp with intestinal metaplasia.  - Negative for dysplasia.   CT angio abdomen/pelvis 02/02/2024 IMPRESSION: 1. No acute findings. 2. No signs of active GI bleed. 3. Sigmoid diverticulosis without signs of acute diverticulitis. 4. Small, less than 5 mm lung nodules identified within the lung bases. In a patient who was at low risk, no further follow-up is indicated. If the patient is at increased risk a follow-up CT of the chest without contrast material in 12 months may be considered 5. Status post cholecystectomy. 6. Aortic atherosclerotic calcifications. 7. Moderate size hiatal hernia.  Colonoscopy 05/20/2012 Normal mucosa in the terminal ileum Moderate diverticulosis in the sigmoid colon Otherwise normal Recall 10 years  Past Medical History:  Diagnosis Date   Anemia    Anxiety    Arthritis    Chronic back pain    cyst sitting on L4-5;slipped disc   Chronic heart failure with preserved ejection fraction (HFpEF) (HCC) 03/2019   EF improved from 25 and 30% back to 50 to 65% with GR 1 DD.   Depression    takes Cymbalta  daily   Diverticulosis    GERD (gastroesophageal reflux disease)    takes Omeprazole  daily   Headache(784.0)    rare   History of pancreatitis 03/27/2008   History of ulcer disease 08/06/2015   Hyperlipidemia    Hypertension    takes Cardura  nightly and Verapamil   daily   Hypothyroidism    Insomnia    takes Restoril  nightly   Malignant neoplasm of lower-inner quadrant of left breast in female, estrogen receptor positive (HCC) 05/20/2013   L Breast Cancer   Obesity (BMI 30-39.9) 06/11/2013   Paroxysmal atrial fibrillation (HCC): CHA2DS2-VASc Score - starting Eliquis  08/06/2015   This patients CHA2DS2-VASc Score and unadjusted Ischemic Stroke Rate (% per year) is equal to 4.8 % stroke rate/year from a score of 4  Above score calculated as 1 point each if present [CHF, HTN, DM, Vascular=MI/PAD/Aortic Plaque, Age if 65-74, or Female]; 2 points each if present [Age > 75, or Stroke/TIA/TE]   Personal history of radiation therapy    2015   Pneumonia due to COVID-19 virus 04/05/2019   PONV (postoperative nausea and vomiting)    Pre-diabetes    RESOLVED: Nonischemic cardiomyopathy (HCC) 07/2015   Normal coronary arteries by cath. EF was 25-30% by echo. GR2 DD - likely related to LBBB in setting of A. fib => EF as of 03/2019: 60 to 65%, no RWMA.  GR 1 DD.   S/P radiation therapy 07/18/2013-08/28/2013   1) Left Breast / 50 Gy in 25 fractions/ 2) Left Breast Boost / 10 Gy in 5 fractions   Shingles     Past Surgical History:  Procedure Laterality Date   ABDOMINAL HYSTERECTOMY  ANTERIOR CERVICAL DECOMP/DISCECTOMY FUSION N/A 09/24/2020   Procedure: ANTERIOR CERVICAL DECOMPRESSION FUSION CERVICAL 5- CERVICAL 6, CERVICAL 6 -  CERVICAL 7 WITH INSTRUMENTATION AND ALLOGRAFT;  Surgeon: Beuford Anes, MD;  Location: [facility] OR;  Service: Orthopedics;  Laterality: N/A;   BACK SURGERY     BREAST LUMPECTOMY Left    1972 neg   BREAST LUMPECTOMY Left    2015 breast cancer   CARDIAC CATHETERIZATION N/A 08/10/2015   Procedure: Right/Left Heart Cath and Coronary Angiography;  Surgeon: Lonni JONETTA Cash, MD;  Location: [facility] INVASIVE CV LAB;  Service: Cardiovascular;: Nonobstructive CAD   CATARACT EXTRACTION BILATERAL W/ ANTERIOR VITRECTOMY Bilateral    CHOLECYSTECTOMY      COLONOSCOPY  2014   DUPUYTREN / PALMAR FASCIOTOMY Bilateral 2007   x 3 to left and once to the right    epidural injections     x 3   ESOPHAGOGASTRODUODENOSCOPY N/A 02/03/2024   Procedure: EGD (ESOPHAGOGASTRODUODENOSCOPY);  Surgeon: Wilhelmenia Aloha Raddle., MD;  Location: [facility];  Service: Gastroenterology;  Laterality: N/A;   FOOT TENDON SURGERY Right ~ 11/2015   stepped in a hole and tore 2 tendons   JOINT REPLACEMENT     KNEE ARTHROSCOPY Bilateral    LUMBAR FUSION  10/2013   Dr. Beuford   TONSILLECTOMY     TOTAL KNEE ARTHROPLASTY Left 09/23/2016   TOTAL KNEE ARTHROPLASTY Left 09/23/2016   Procedure: TOTAL KNEE ARTHROPLASTY;  Surgeon: Yvone Rush, MD;  Location: [facility] OR;  Service: Orthopedics;  Laterality: Left;   TRANSTHORACIC ECHOCARDIOGRAM  08/07/2015   Severely reduced LVEF at 25-30% with diffuse HK. GR 2 DD. Ventricular dyssynergy secondary to LBBB. Small to moderate pericardial effusion but no hemodynamic compromise   TRANSTHORACIC ECHOCARDIOGRAM  10/2015    (EF up from 25-30%): - Left ventricle: Poor acoustic windows -  difficult to see.    EF estimated at 45-50% with inferoseptal and possible posterior hypokinesis. GR 1 DD. No pericardial effusion.   TRANSTHORACIC ECHOCARDIOGRAM  04/05/2019   EF 60 to 65% without wall motion normality.  GR 1 DD.  Mild LA dilation.  Trivial MR.  Frequent PVCs noted.   TUBAL LIGATION     UPPER GI ENDOSCOPY      Current Outpatient Medications  Medication Sig Dispense Refill   acetaminophen  (TYLENOL ) 500 MG tablet Take 500-1,000 mg by mouth every 8 (eight) hours as needed for mild pain (pain score 1-3) (or headaches).     apixaban  (ELIQUIS ) 5 MG TABS tablet Take 1 tablet (5 mg total) by mouth 2 (two) times daily. 180 tablet 2   Cholecalciferol  (VITAMIN D3) 250 MCG (10000 UT) TABS Take 5,000 Units by mouth daily.     Coenzyme Q10 (COQ10 PO) Take 1 capsule by mouth See admin instructions. Take 1 capsule by mouth once a day ON ALL DAYS  CRESTOR /ROSUVASTATIN  is taken     cyclobenzaprine  (FLEXERIL ) 5 MG tablet Take 1 tablet (5 mg total) by mouth 3 (three) times daily as needed for muscle spasms. 60 tablet 0   DULoxetine  (CYMBALTA ) 60 MG capsule Take 60 mg by mouth in the morning.     furosemide  (LASIX ) 20 MG tablet Take 1 tablet (20 mg total) by mouth daily. 90 tablet 3   gabapentin  (NEURONTIN ) 300 MG capsule TAKE 1 CAPSULE BY MOUTH TWICE A DAY FOR CHRONIC PAIN (Patient taking differently: Take 300 mg by mouth in the morning and at bedtime.) 180 capsule 1   HYDROcodone -acetaminophen  (NORCO/VICODIN) 5-325 MG tablet Take 1 tablet by mouth every  6 (six) hours as needed. (Patient taking differently: Take 1 tablet by mouth every 6 (six) hours as needed (for pain).) 10 tablet 0   hydrocortisone  cream 1 % Apply to affected area 2 times daily (Patient taking differently: Apply 1 Application topically 2 (two) times daily as needed for itching.) 15 g 0   hydrOXYzine  (ATARAX ) 50 MG tablet TAKE 1 TO 2 TABLETS 1 TO 2 HOURS BEFORE BEDTIME AS NEEDED FOR SLEEP (Patient taking differently: Take 0.5-1 mg by mouth at bedtime as needed (for sleep).) 180 tablet 2   ipratropium (ATROVENT ) 0.03 % nasal spray Place 2 sprays into the nose every Monday, Tuesday, Wednesday, Thursday, and Friday. (Patient taking differently: Place 2 sprays into the nose in the morning.) 30 mL 0   ketoconazole  (NIZORAL ) 2 % shampoo USE AS DIRECTED TWICE A WEEK (Patient taking differently: Apply 1 Application topically See admin instructions. Shampoo 2 times a week) 120 mL 0   levothyroxine  (SYNTHROID ) 50 MCG tablet 1 tab daily Please take your thyroid  medication greater than 30 min before breakfast, separated by at least 4 hours  from antacids, calcium , iron, and multivitamins. (Patient taking differently: Take 50 mcg by mouth daily before breakfast. Take 50 mcg by mouth at least 30 minutes before breakfast- separated by at least 4 hours  from antacids, calcium , iron, and  multivitamins) 90 tablet 3   lidocaine  (LIDODERM ) 5 % Place 1 patch onto the skin daily. Remove & Discard patch within 12 hours or as directed by MD 30 patch 0   losartan  (COZAAR ) 50 MG tablet TAKE 1 TABLET BY MOUTH EVERY DAY 90 tablet 3   meclizine  (ANTIVERT ) 25 MG tablet 1/2-1 pill up to 3 times daily for motion sickness/dizziness. Caution, may cause drowsiness. (Patient taking differently: Take 12.5-25 mg by mouth 3 (three) times daily as needed for dizziness (or motion sickness- causes drowsiness).) 60 tablet 0   metoprolol  succinate (TOPROL -XL) 25 MG 24 hr tablet TAKE 1 TABLET (25 MG TOTAL) BY MOUTH DAILY. 90 tablet 3   naproxen  (NAPROSYN ) 500 MG tablet Take 1 tablet (500 mg total) by mouth 2 (two) times daily. (Patient taking differently: Take 500 mg by mouth 2 (two) times daily as needed for mild pain (pain score 1-3) (TAKE WITH FOOD).) 30 tablet 0   nystatin  cream (MYCOSTATIN ) APPLY TO AFFECTED AREA TWICE A DAY (Patient taking differently: Apply 1 Application topically 2 (two) times daily as needed (for Candidiasis of the skin).) 90 g 3   ondansetron  (ZOFRAN ) 8 MG tablet Take 1/2 to 1 3 x /day before meals as needed for Nausea (Patient taking differently: Take 4-8 mg by mouth See admin instructions. Take 4-8 mg by mouth three times a day before meals as needed for nausea) 90 tablet 0   pantoprazole  (PROTONIX ) 40 MG tablet Take 1 tablet (40 mg total) by mouth 2 (two) times daily for 60 days, THEN 1 tablet (40 mg total) daily. 210 tablet 0   rosuvastatin  (CRESTOR ) 40 MG tablet TAKE 1 TABLET BY MOUTH EVERY DAY EXCEPT TAKE 2 TABLETS ON MONDAYS 102 tablet 2   sucralfate  (CARAFATE ) 1 g tablet Take 1 tablet (1 g total) by mouth 4 (four) times daily -  with meals and at bedtime. 120 tablet 0   Current Facility-Administered Medications  Medication Dose Route Frequency Provider Last Rate Last Admin   ipratropium-albuterol  (DUONEB) 0.5-2.5 (3) MG/3ML nebulizer solution 3 mL  3 mL Nebulization Once  Cranford, Tonya, NP        Allergies  as of 03/22/2024 - Review Complete 03/22/2024  Allergen Reaction Noted   Morphine sulfate Palpitations 03/13/2008   Requip [ropinirole hcl] Shortness Of Breath and Nausea And Vomiting 03/27/2013   Amoxicillin Dermatitis and Rash 03/27/2013   Codeine Other (See Comments) 03/27/2013   Ezetimibe Other (See Comments) 03/27/2013   Minocycline Hives 06/06/2008   Spironolactone  Rash and Dermatitis 10/15/2021   Sulfa antibiotics Other (See Comments) 03/27/2013   Tetracyclines & related Hives 03/28/2013   Zestril [lisinopril] Cough 03/27/2013    Family History  Problem Relation Age of Onset   Heart attack Mother    Stroke Mother    COPD Father    Hypertension Father    Hyperlipidemia Sister    Breast cancer Sister        56s   Colon polyps Neg Hx    Esophageal cancer Neg Hx    Rectal cancer Neg Hx    Stomach cancer Neg Hx     Social History[1]   Review of Systems:    Constitutional: No fever, chills Cardiovascular: No chest pain Respiratory: No SOB  Gastrointestinal: See HPI and otherwise negative   Physical Exam:  Vital signs: BP 120/64   Pulse 77   Ht 5' 5 (1.651 m)   Wt 214 lb 4 oz (97.2 kg)   BMI 35.65 kg/m   Wt Readings from Last 3 Encounters:  03/22/24 214 lb 4 oz (97.2 kg)  02/03/24 220 lb 0.3 oz (99.8 kg)  01/01/24 203 lb (92.1 kg)     Constitutional: Pleasant, obese female in NAD, alert and cooperative Head:  Normocephalic and atraumatic.  Respiratory: Respirations even and unlabored. Lungs clear to auscultation bilaterally.  No wheezes, crackles, or rhonchi.  Cardiovascular:  Regular rate and rhythm. No murmurs. No peripheral edema. Gastrointestinal:  Soft, nondistended, tender to palpation over lower chest wall bilaterally, more so than upper abdomen. No rebound or guarding. Normal bowel sounds. No appreciable masses or hepatomegaly. Rectal:  Not performed.  Neurologic:  Alert and oriented x4;  grossly normal  neurologically.  Skin:   Dry and intact without significant lesions or rashes. Psychiatric: Oriented to person, place and time. Demonstrates good judgement and reason without abnormal affect or behaviors.   RELEVANT LABS AND IMAGING: CBC    Component Value Date/Time   WBC 7.4 02/04/2024 0414   RBC 2.78 (L) 02/04/2024 0414   HGB 8.3 (L) 02/04/2024 1339   HGB 10.1 (L) 07/13/2017 1321   HGB 12.0 07/13/2016 1005   HCT 27.2 (L) 02/04/2024 1339   HCT 34.9 07/13/2016 1005   PLT 261 02/04/2024 0414   PLT 245 07/13/2017 1321   PLT 279 07/13/2016 1005   MCV 93.9 02/04/2024 0414   MCV 88.2 07/13/2016 1005   MCH 28.1 02/04/2024 0414   MCHC 29.9 (L) 02/04/2024 0414   RDW 14.8 02/04/2024 0414   RDW 13.9 07/13/2016 1005   LYMPHSABS 1.5 01/01/2024 0616   LYMPHSABS 1.0 07/13/2016 1005   MONOABS 0.9 01/01/2024 0616   MONOABS 0.5 07/13/2016 1005   EOSABS 0.4 01/01/2024 0616   EOSABS 0.2 07/13/2016 1005   BASOSABS 0.1 01/01/2024 0616   BASOSABS 0.1 07/13/2016 1005    CMP     Component Value Date/Time   NA 143 02/04/2024 0414   NA 139 08/30/2021 1250   NA 139 07/13/2016 1005   K 3.6 02/04/2024 0414   K 3.7 07/13/2016 1005   CL 110 02/04/2024 0414   CO2 26 02/04/2024 0414   CO2 25 07/13/2016 1005  GLUCOSE 96 02/04/2024 0414   GLUCOSE 135 07/13/2016 1005   BUN 10 02/04/2024 0414   BUN 16 08/30/2021 1250   BUN 15.2 07/13/2016 1005   CREATININE 1.01 (H) 02/04/2024 0414   CREATININE 1.16 (H) 03/20/2023 1446   CREATININE 0.8 07/13/2016 1005   CALCIUM  8.7 (L) 02/04/2024 0414   CALCIUM  9.7 07/13/2016 1005   PROT 5.9 (L) 02/04/2024 0414   PROT 7.2 07/13/2016 1005   ALBUMIN  3.3 (L) 02/04/2024 0414   ALBUMIN  3.6 07/13/2016 1005   AST 86 (H) 02/04/2024 0414   AST 15 07/13/2017 1321   AST 17 07/13/2016 1005   ALT 53 (H) 02/04/2024 0414   ALT 13 07/13/2017 1321   ALT 20 07/13/2016 1005   ALKPHOS 67 02/04/2024 0414   ALKPHOS 150 07/13/2016 1005   BILITOT 0.3 02/04/2024 0414    BILITOT 0.2 07/13/2017 1321   BILITOT 0.54 07/13/2016 1005   GFRNONAA 57 (L) 02/04/2024 0414   GFRNONAA 60 09/02/2020 1433   GFRAA 69 09/02/2020 1433   Echocardiogram 04/05/2019 1. Left ventricular ejection fraction, by visual estimation, is 60 to 65% . There is no increased left ventricular wall thickness.  2. Abnormal septal motion consistent with left bundle branch block.  3. Left ventricular diastolic parameters are consistent with Grade I diastolic dysfunction ( impaired relaxation) .  4. Global right ventricle has normal systolc function. The right ventricular size is normal. no increase in right ventricular wall thickness.  5. Left atrial size was mildly dilated.  6. The mitral valve is normal in structure. Trivial mitral valve regurgitation.  7. The pulmonic valve was normal in structure.  8. Normal pulmonary artery systolic pressure.  9. Frequent PVCs noted  10. The inferior vena cava is normal in size with greater than 50% respiratory variability, suggesting right atrial pressure of 3 mmHg.  11. The tricuspid valve was normal in structure. Tricuspid valve regurgitation is trivial.  12. Tricuspid valve regurgitation is trivial.  Assessment/Plan:   Assessment & Plan Gastric polyp with intestinal metaplasia Large gastric polyp removed. Pathology showed intestinal metaplasia, increasing future stomach cancer risk. No cancer detected.  Advised to have repeat EGD with gastric mapping in 6 months.  Recall is placed.  Patient denies any evidence of GI bleeding.  - Repeat upper endoscopy in April 2026 for gastric mapping, sooner if signs of recurrent GI bleeding - Ensured recall for endoscopy is placed in her chart. - Will send pathology report letter to primary care physician. - Printed copies of path report letter for patient as requested - Continue pantoprazole  40 mg twice daily for total of 60 days, then decrease to once daily  Iron deficiency anemia, post-GI bleed Anemia  secondary to GI bleed, suspected from gastric polyp. Blood counts improved with iron infusions. No current bleeding.  - Labs today: CBC, CMP, iron/ferritin - Will consider additional iron infusions if anemia persists.  Altered bowel habits Alternating constipation and diarrhea with urgency. No blood in stool. Stools formed but loose.  Discussed diagnostic colonoscopy, she is not interested in colonoscopy at this time.  Can revisit at follow-up.  - Start fiber supplement to regulate bowel movements. - Monitor bowel habits and pain patterns. - Consider colonoscopy if bowel habits do not improve or if anemia persists. - Follow up 6-8 weeks  Gastroesophageal reflux disease GERD managed with pantoprazole .   - Continue pantoprazole  twice daily for 60 days, then reduce to once daily. - Monitor for symptoms of reflux or heartburn.  Elevated liver enzymes  Liver enzymes elevated in hospital, likely due to shock liver. Improvement noted, not yet normalized.  - Repeat liver enzyme tests today.  Chest wall pain/upper abdominal pain Patient reports LUQ abdominal pain which occurs intermittently since hospitalization with no clear pattern.  On exam has some minimal tenderness to palpation of the upper abdomen but increased tenderness to palpation over lower ribs/chest wall bilaterally.  Suspect this is the source of her pain.  She does notice improvement with Tylenol .    - Advised to monitor, follow-up with PCP.   Camie Furbish, PA-C [facility] 03/22/2024, 1:30 PM  Patient Care Team: Beam, Lamar POUR, MD as PCP - General (Family Medicine) Anner Alm ORN, MD as PCP - Cardiology (Cardiology) Anner Alm ORN, MD as Consulting Physician (Cardiology) Yvone Rush, MD as Consulting Physician (Orthopedic Surgery) Ebbie Cough, MD as Consulting Physician (General Surgery) Izell Domino, MD as Attending Physician (Radiation Oncology)       [1]  Social History Tobacco Use    Smoking status: Never   Smokeless tobacco: Never  Vaping Use   Vaping status: Never Used  Substance Use Topics   Alcohol use: Yes    Comment: wine occasionally-rarely   Drug use: No

## 2024-03-23 NOTE — Progress Notes (Signed)
 Attending Physician's Attestation   I have reviewed the chart.   I agree with the Advanced Practitioner's note, impression, and recommendations with any updates as below. Agree with repeat EGD as planned.  If issues from lower perspective persist then colonoscopy at same time makes sense.   Aloha Finner, MD [facility] Advanced Endoscopy Office # [phone]

## 2024-03-25 ENCOUNTER — Ambulatory Visit: Payer: Self-pay | Admitting: Gastroenterology

## 2024-03-27 ENCOUNTER — Other Ambulatory Visit: Payer: Self-pay | Admitting: Cardiology

## 2024-03-27 NOTE — Telephone Encounter (Signed)
 Prescription refill request for Eliquis  received. Indication:afib Last office visit:4/25 Scr: 0.95  12/25 Age:78 Weight:97.2  kg  Prescription refilled

## 2024-03-27 NOTE — Telephone Encounter (Signed)
Patient returning call. Please advise, thank you

## 2024-05-03 ENCOUNTER — Other Ambulatory Visit

## 2024-05-03 ENCOUNTER — Encounter: Payer: Self-pay | Admitting: Gastroenterology

## 2024-05-03 ENCOUNTER — Ambulatory Visit: Admitting: Gastroenterology

## 2024-05-03 VITALS — BP 118/68 | HR 79 | Ht 65.0 in | Wt 212.1 lb

## 2024-05-03 DIAGNOSIS — K449 Diaphragmatic hernia without obstruction or gangrene: Secondary | ICD-10-CM | POA: Diagnosis not present

## 2024-05-03 DIAGNOSIS — K219 Gastro-esophageal reflux disease without esophagitis: Secondary | ICD-10-CM | POA: Diagnosis not present

## 2024-05-03 DIAGNOSIS — R140 Abdominal distension (gaseous): Secondary | ICD-10-CM

## 2024-05-03 DIAGNOSIS — R152 Fecal urgency: Secondary | ICD-10-CM

## 2024-05-03 DIAGNOSIS — R143 Flatulence: Secondary | ICD-10-CM

## 2024-05-03 DIAGNOSIS — R194 Change in bowel habit: Secondary | ICD-10-CM | POA: Diagnosis not present

## 2024-05-03 DIAGNOSIS — K317 Polyp of stomach and duodenum: Secondary | ICD-10-CM

## 2024-05-03 DIAGNOSIS — D509 Iron deficiency anemia, unspecified: Secondary | ICD-10-CM | POA: Diagnosis not present

## 2024-05-03 DIAGNOSIS — K31A0 Gastric intestinal metaplasia, unspecified: Secondary | ICD-10-CM | POA: Diagnosis not present

## 2024-05-03 LAB — CBC WITH DIFFERENTIAL/PLATELET
Basophils Absolute: 0.1 K/uL (ref 0.0–0.1)
Basophils Relative: 0.6 % (ref 0.0–3.0)
Eosinophils Absolute: 0.3 K/uL (ref 0.0–0.7)
Eosinophils Relative: 2.6 % (ref 0.0–5.0)
HCT: 37.5 % (ref 36.0–46.0)
Hemoglobin: 12.6 g/dL (ref 12.0–15.0)
Lymphocytes Relative: 15.2 % (ref 12.0–46.0)
Lymphs Abs: 1.9 K/uL (ref 0.7–4.0)
MCHC: 33.5 g/dL (ref 30.0–36.0)
MCV: 86.5 fl (ref 78.0–100.0)
Monocytes Absolute: 1.3 K/uL — ABNORMAL HIGH (ref 0.1–1.0)
Monocytes Relative: 10.3 % (ref 3.0–12.0)
Neutro Abs: 9.1 K/uL — ABNORMAL HIGH (ref 1.4–7.7)
Neutrophils Relative %: 71.3 % (ref 43.0–77.0)
Platelets: 282 K/uL (ref 150.0–400.0)
RBC: 4.34 Mil/uL (ref 3.87–5.11)
RDW: 17.1 % — ABNORMAL HIGH (ref 11.5–15.5)
WBC: 12.8 K/uL — ABNORMAL HIGH (ref 4.0–10.5)

## 2024-05-03 MED ORDER — PANTOPRAZOLE SODIUM 40 MG PO TBEC: 40.0000 mg | DELAYED_RELEASE_TABLET | Freq: Two times a day (BID) | ORAL | 3 refills | Status: AC

## 2024-05-03 NOTE — Patient Instructions (Addendum)
 Abdominal bloating and discomfort may be due to intestinal sensitivity or symptoms of irritable bowel syndrome. To relieve symptoms, avoid:  Broccoli  Baked beans  Cabbage  Carbonated drinks  Cauliflower  Chewing gum  Hard candy Abdominal distention resulting from weak abdominal muscles:  Is better in the morning  Gets worse as the day progresses  Is relieved by lying down Flatulence is gas created through bacterial action in the bowel and passed rectally. Keep in mind that:  10-18 passages per day are normal  Primary gases are harmless and odorless  Noticeable smells are trace gases related to food intake Foods to AVOID that are likely to form gas include:  Milk, dairy products, and medications that contain lactose--If your body doesn't produce the enzyme (lactase) to break it down.  Certain vegetables--baked beans, cauliflower, broccoli, cabbage  Certain starches--wheat, oats, corn, potatoes. Rice is a good substitute. Identify offending foods. Reduce or eliminate these gas-forming foods from your diet. Can look at the FODMAP diet.    You have been given a testing kit to check for small intestine bacterial overgrowth (SIBO) which is completed by a company named [employer]. Make sure to return your test in the mail using the return mailing label given to you along with the kit. The test order, your demographic and insurance information have all already been sent to the company. [facility] will collect an upfront charge of $109.00 for commercial insurance plans and $229.00 if you are paying cash. The potential remaining total after claim submission and review is $120.00. Make sure to discuss with [facility] PRIOR to having the test to see if they have gotten information from your insurance company as to how much your testing will cost out of pocket, if any. Please contact [facility] at phone number [phone] to get instructions regarding how to perform the test as our  office is unable to give specific testing instructions.   Please go to the lab in the basement of our building to have lab work done as you leave today. Hit B for basement when you get on the elevator.  When the doors open the lab is on your left.  We will call you with the results. Thank you.  We have sent the following medications to your pharmacy for you to pick up at your convenience: Protonix  40 mg  _______________________________________________________  If your blood pressure at your visit was 140/90 or greater, please contact your primary care physician to follow up on this.  _______________________________________________________  If you are age 79 or older, your body mass index should be between 23-30. Your Body mass index is 35.3 kg/m. If this is out of the aforementioned range listed, please consider follow up with your Primary Care Provider.  If you are age 79 or younger, your body mass index should be between 19-25. Your Body mass index is 35.3 kg/m. If this is out of the aformentioned range listed, please consider follow up with your Primary Care Provider.   ________________________________________________________  The [facility] providers would like to encourage you to use MYCHART to communicate with providers for non-urgent requests or questions.  Due to long hold times on the telephone, sending your provider a message by [facility] may be a faster and more efficient way to get a response.  Please allow 48 business hours for a response.  Please remember that this is for non-urgent requests.  _______________________________________________________  [facility] is using a team-based approach to care.  Your team is made up of your  doctor and two to three APPS. Our APPS (Nurse Practitioners and Physician Assistants) work with your physician to ensure care continuity for you. They are fully qualified to address your health concerns and develop a treatment plan. They  communicate directly with your gastroenterologist to care for you. Seeing the Advanced Practice Practitioners on your physician's team can help you by facilitating care more promptly, often allowing for earlier appointments, access to diagnostic testing, procedures, and other specialty referrals.   Due to recent changes in healthcare laws, you may see the results of your imaging and laboratory studies on MyChart before your provider has had a chance to review them.  We understand that in some cases there may be results that are confusing or concerning to you. Not all laboratory results come back in the same time frame and the provider may be waiting for multiple results in order to interpret others.  Please give us  48 hours in order for your provider to thoroughly review all the results before contacting the office for clarification of your results.

## 2024-05-03 NOTE — Progress Notes (Signed)
 "  [name] [name] [id number] 08-Oct-1945   Chief Complaint: Heartburn, gas  Referring Provider: Dorcus Lamar POUR, MD Primary GI MD: Dr. Wilhelmenia   HPI: [name] [name] is a 79 y.o. female with past medical history of anemia, anxiety/depression, arthritis, CHF, GERD, previous pancreatitis, PUD, HLD, HTN, hypothyroidism, left breast cancer 2015, obesity, paroxysmal A-fib [name] , hysterectomy, cholecystectomy who presents today for a complaint of heartburn and gas.    Patient was admitted to the hospital 02/02/2024 to 02/04/2024 for GI bleed.  She presented to the ED as directed by PCP for lab abnormality with outpatient hemoglobin of 6.3.  3 to 4 weeks prior, endorsed having vague abdominal pain with bloating and dark stools, nausea without vomiting.  She was on an iron supplement so did not think much of the dark stools at that time.  Had progressively become weaker and sought further care with PCP.   Vital stable in the ED.  Found to have hemoglobin of 5.9, elevated liver enzymes.  FOBT positive.  Denied NSAID abuse.  CTA abdomen/pelvis showed no acute findings, no signs of active GI bleeding, noted to have sigmoid diverticulosis without evidence of acute diverticulitis, moderate size hiatal hernia.  1 unit PRBCs was ordered, GI was consulted.   Patient underwent EGD with findings of multiple gastric polyps, a single gastric polyp in the antrum/prepylorus that was prolapsed into the duodenum which was resected with clips placed.  She was advised to continue Protonix  40 mg twice daily for 2 months followed by once daily thereafter, Carafate  3 times daily for a month.  Plan was for outpatient colonoscopy.  Referral was placed for outpatient IV iron infusion.   Elevated LFTs thought secondary to mild shock liver in the setting of anemia.  There was improvement during hospitalization and advised have repeat CMP outpatient.   Labs 02/14/2024: mild elevation in ALT to 43 and otherwise normal  CMP   Labs 03/15/2024: Hemoglobin 12, normal MCV   On follow-up with PCP on 03/18/2024.  Endorsed continued GI issues.  Urgency of bowel movements with near incontinence, frequent flatulence, no melena or BRBPR.  Improved energy.  I saw patient in office 03/22/2024.  Patient doing well at that time.  No evidence of GI bleeding.  Anemia improving with IV iron.  Was having alternating constipation and diarrhea with associated urgency.  Discussed diagnostic colonoscopy, she was not interested at that time.  Advised fiber supplementation and continued monitoring of bowel habits and pain patterns and if persistent symptoms or if persistent anemia plan for reconsideration of colonoscopy.   She is due for repeat upper endoscopy in April for gastric mapping, sooner if signs of recurrent GI bleeding. Had elevated liver enzymes during recent hospitalization which was thought likely due to shock liver.  There had been improvement but not normalization and liver enzymes were repeated at last visit. She reported some left upper quadrant abdominal pain intermittently since hospitalization.  On exam had tenderness to palpation of lower ribs/chest wall bilaterally which was suspected to be the source of her pain, advised to monitor and follow-up with PCP.  He did notice improvement with Tylenol .  Labs showed improvement in anemia with hemoglobin nearly in normal range, improving iron studies.  Liver enzymes normalized.  Slightly low potassium and was advised to increase potassium rich foods and follow-up with PCP.   Discussed the use of AI scribe software for clinical note transcription with the patient, who gave verbal consent to proceed.  History of Present Illness  [name] [name] is a 79 year old female with GERD, hiatal hernia, and iron deficiency anemia post-GI bleed who presents with worsening heartburn.  Gastroesophageal Reflux Symptoms: - Worsening heartburn and chest pain over the past two weeks -  Heartburn described as severe - Pain localized to the chest, accompanied by a sensation of trapped gas - Upper abdominal discomfort present intermittently - Symptoms worsened after Protonix  dose was reduced from twice daily to once daily - Previously used omeprazole  twice daily before switching to Protonix  - Completed a course of Carafate , now discontinued - Maintains hydration and occasionally drinks sodas to relieve trapped gas - Does use straws - No vomiting or melena  Altered Bowel Habits: - Change in bowel habits since hospitalization - Previously had daily, formed, normal bowel movements - Currently experiences loose stools with occasional urgency - Typically two bowel movements in one day followed by up to two days without a bowel movement - No improvement with dietary fiber supplementation - No sensation of constipation - Occasional watery stools but this occurs less frequently - No red blood observed in stool - Prior colonoscopies normal without polyps  Iron Deficiency Anemia and GI Bleed History: - Not currently taking oral iron and has not received iron since last infusion - Anemia has improved, with last blood count about a month ago showing near normalization - Prefers to avoid iron therapy if possible  Gastric Polyp Surveillance: - History of gastric polyp with intestinal metaplasia, under surveillance   Previous GI Procedures/Imaging   EGD 02/03/2024 - No gross lesions in the entire esophagus. Z- line irregular, 35 cm from the incisors.  - 5 cm hiatal hernia.  - Erythematous mucosa in the stomach. Biopsied.  - Multiple gastric polyps ( fundic gland in appearance) .  - A single gastric polyp in antrum/ prepylorus that was prolapsed into the duodenum ( > 50 mm in size and semipedunculated and broad- based) . Resected and retrieved with polyloop aid ( see above for full details) . Clips were placed. Clip manufacturer: [employer]. Injected Purastat for  additional hemostasis.  - No gross lesions in the duodenal bulb, in the first portion of the duodenum and in the second portion of the duodenum. Path: FINAL MICROSCOPIC DIAGNOSIS:   A. GASTRIC, RANDOM, BIOPSY:  - Benign gastric mucosa with mild reactive changes.  - No evidence of H. pylori on HE stain.    B. GASTRIC, EMR, POLYPECTOMY:  - Gastric hyperplastic polyp with intestinal metaplasia.  - Negative for dysplasia.    CT angio abdomen/pelvis 02/02/2024 IMPRESSION: 1. No acute findings. 2. No signs of active GI bleed. 3. Sigmoid diverticulosis without signs of acute diverticulitis. 4. Small, less than 5 mm lung nodules identified within the lung bases. In a patient who was at low risk, no further follow-up is indicated. If the patient is at increased risk a follow-up CT of the chest without contrast material in 12 months may be considered 5. Status post cholecystectomy. 6. Aortic atherosclerotic calcifications. 7. Moderate size hiatal hernia.   Colonoscopy 05/20/2012 Normal mucosa in the terminal ileum Moderate diverticulosis in the sigmoid colon Otherwise normal Recall 10 years   Past Medical History:  Diagnosis Date   Anemia    Anxiety    Arthritis    Chronic back pain    cyst sitting on L4-5;slipped disc   Chronic heart failure with preserved ejection fraction (HFpEF) (HCC) 03/2019   EF improved from 25 and 30% back to 50 to 65% with GR 1  DD.   Depression    takes Cymbalta  daily   Diverticulosis    GERD (gastroesophageal reflux disease)    takes Omeprazole  daily   Headache(784.0)    rare   History of pancreatitis 03/27/2008   History of ulcer disease 08/06/2015   Hyperlipidemia    Hypertension    takes Cardura  nightly and Verapamil  daily   Hypothyroidism    Insomnia    takes Restoril  nightly   Malignant neoplasm of lower-inner quadrant of left breast in female, estrogen receptor positive (HCC) 05/20/2013   L Breast Cancer   Obesity (BMI 30-39.9)  06/11/2013   Paroxysmal atrial fibrillation (HCC): CHA2DS2-VASc Score - starting Eliquis  08/06/2015   This patients CHA2DS2-VASc Score and unadjusted Ischemic Stroke Rate (% per year) is equal to 4.8 % stroke rate/year from a score of 4  Above score calculated as 1 point each if present [CHF, HTN, DM, Vascular=MI/PAD/Aortic Plaque, Age if 65-74, or Female]; 2 points each if present [Age > 75, or Stroke/TIA/TE]   Personal history of radiation therapy    2015   Pneumonia due to COVID-19 virus 04/05/2019   PONV (postoperative nausea and vomiting)    Pre-diabetes    RESOLVED: Nonischemic cardiomyopathy (HCC) 07/2015   Normal coronary arteries by cath. EF was 25-30% by echo. GR2 DD - likely related to LBBB in setting of A. fib => EF as of 03/2019: 60 to 65%, no RWMA.  GR 1 DD.   S/P radiation therapy 07/18/2013-08/28/2013   1) Left Breast / 50 Gy in 25 fractions/ 2) Left Breast Boost / 10 Gy in 5 fractions   Shingles     Past Surgical History:  Procedure Laterality Date   ABDOMINAL HYSTERECTOMY     ANTERIOR CERVICAL DECOMP/DISCECTOMY FUSION N/A 09/24/2020   Procedure: ANTERIOR CERVICAL DECOMPRESSION FUSION CERVICAL 5- CERVICAL 6, CERVICAL 6 -  CERVICAL 7 WITH INSTRUMENTATION AND ALLOGRAFT;  Surgeon: Beuford Anes, MD;  Location: [facility] OR;  Service: Orthopedics;  Laterality: N/A;   BACK SURGERY     BREAST LUMPECTOMY Left    1972 neg   BREAST LUMPECTOMY Left    2015 breast cancer   CARDIAC CATHETERIZATION N/A 08/10/2015   Procedure: Right/Left Heart Cath and Coronary Angiography;  Surgeon: Lonni JONETTA Cash, MD;  Location: [facility] INVASIVE CV LAB;  Service: Cardiovascular;: Nonobstructive CAD   CATARACT EXTRACTION BILATERAL W/ ANTERIOR VITRECTOMY Bilateral    CHOLECYSTECTOMY     COLONOSCOPY  2014   DUPUYTREN / PALMAR FASCIOTOMY Bilateral 2007   x 3 to left and once to the right    epidural injections     x 3   ESOPHAGOGASTRODUODENOSCOPY N/A 02/03/2024   Procedure: EGD  (ESOPHAGOGASTRODUODENOSCOPY);  Surgeon: Wilhelmenia Aloha Raddle., MD;  Location: [facility];  Service: Gastroenterology;  Laterality: N/A;   FOOT TENDON SURGERY Right ~ 11/2015   stepped in a hole and tore 2 tendons   JOINT REPLACEMENT     KNEE ARTHROSCOPY Bilateral    LUMBAR FUSION  10/2013   Dr. Beuford   TONSILLECTOMY     TOTAL KNEE ARTHROPLASTY Left 09/23/2016   TOTAL KNEE ARTHROPLASTY Left 09/23/2016   Procedure: TOTAL KNEE ARTHROPLASTY;  Surgeon: Yvone Rush, MD;  Location: [facility] OR;  Service: Orthopedics;  Laterality: Left;   TRANSTHORACIC ECHOCARDIOGRAM  08/07/2015   Severely reduced LVEF at 25-30% with diffuse HK. GR 2 DD. Ventricular dyssynergy secondary to LBBB. Small to moderate pericardial effusion but no hemodynamic compromise   TRANSTHORACIC ECHOCARDIOGRAM  10/2015    (EF up from  25-30%): - Left ventricle: Poor acoustic windows -  difficult to see.    EF estimated at 45-50% with inferoseptal and possible posterior hypokinesis. GR 1 DD. No pericardial effusion.   TRANSTHORACIC ECHOCARDIOGRAM  04/05/2019   EF 60 to 65% without wall motion normality.  GR 1 DD.  Mild LA dilation.  Trivial MR.  Frequent PVCs noted.   TUBAL LIGATION     UPPER GI ENDOSCOPY      Current Outpatient Medications  Medication Sig Dispense Refill   acetaminophen  (TYLENOL ) 500 MG tablet Take 500-1,000 mg by mouth every 8 (eight) hours as needed for mild pain (pain score 1-3) (or headaches).     Cholecalciferol  (VITAMIN D3) 250 MCG (10000 UT) TABS Take 5,000 Units by mouth daily.     Coenzyme Q10 (COQ10 PO) Take 1 capsule by mouth See admin instructions. Take 1 capsule by mouth once a day ON ALL DAYS CRESTOR /ROSUVASTATIN  is taken     cyclobenzaprine  (FLEXERIL ) 5 MG tablet Take 1 tablet (5 mg total) by mouth 3 (three) times daily as needed for muscle spasms. 60 tablet 0   DULoxetine  (CYMBALTA ) 60 MG capsule Take 60 mg by mouth in the morning.     ELIQUIS  5 MG TABS tablet TAKE 1 TABLET BY MOUTH TWICE A DAY  180 tablet 2   furosemide  (LASIX ) 20 MG tablet Take 1 tablet (20 mg total) by mouth daily. 90 tablet 3   gabapentin  (NEURONTIN ) 300 MG capsule TAKE 1 CAPSULE BY MOUTH TWICE A DAY FOR CHRONIC PAIN 180 capsule 1   hydrocortisone  cream 1 % Apply to affected area 2 times daily 15 g 0   ipratropium (ATROVENT ) 0.03 % nasal spray Place 2 sprays into the nose every Monday, Tuesday, Wednesday, Thursday, and Friday. 30 mL 0   ketoconazole  (NIZORAL ) 2 % shampoo USE AS DIRECTED TWICE A WEEK 120 mL 0   levothyroxine  (SYNTHROID ) 50 MCG tablet 1 tab daily Please take your thyroid  medication greater than 30 min before breakfast, separated by at least 4 hours  from antacids, calcium , iron, and multivitamins. 90 tablet 3   lidocaine  (LIDODERM ) 5 % Place 1 patch onto the skin daily. Remove & Discard patch within 12 hours or as directed by MD 30 patch 0   losartan  (COZAAR ) 50 MG tablet TAKE 1 TABLET BY MOUTH EVERY DAY 90 tablet 3   metoprolol  succinate (TOPROL -XL) 25 MG 24 hr tablet TAKE 1 TABLET (25 MG TOTAL) BY MOUTH DAILY. 90 tablet 3   naproxen  (NAPROSYN ) 500 MG tablet Take 1 tablet (500 mg total) by mouth 2 (two) times daily. 30 tablet 0   nystatin  cream (MYCOSTATIN ) APPLY TO AFFECTED AREA TWICE A DAY 90 g 3   ondansetron  (ZOFRAN ) 8 MG tablet Take 1/2 to 1 3 x /day before meals as needed for Nausea 90 tablet 0   pantoprazole  (PROTONIX ) 40 MG tablet Take 1 tablet (40 mg total) by mouth 2 (two) times daily for 60 days, THEN 1 tablet (40 mg total) daily. 210 tablet 0   rosuvastatin  (CRESTOR ) 40 MG tablet TAKE 1 TABLET BY MOUTH EVERY DAY EXCEPT TAKE 2 TABLETS ON MONDAYS 102 tablet 2   HYDROcodone -acetaminophen  (NORCO/VICODIN) 5-325 MG tablet Take 1 tablet by mouth every 6 (six) hours as needed. (Patient not taking: Reported on 05/03/2024) 10 tablet 0   hydrOXYzine  (ATARAX ) 50 MG tablet TAKE 1 TO 2 TABLETS 1 TO 2 HOURS BEFORE BEDTIME AS NEEDED FOR SLEEP (Patient not taking: Reported on 05/03/2024) 180 tablet 2  meclizine  (ANTIVERT ) 25 MG tablet 1/2-1 pill up to 3 times daily for motion sickness/dizziness. Caution, may cause drowsiness. (Patient not taking: Reported on 05/03/2024) 60 tablet 0   omeprazole  (PRILOSEC) 40 MG capsule TAKE ONE CAPSULE BY MOUTH 2 TIMES DAILY.     potassium chloride  SA (KLOR-CON  M) 20 MEQ tablet Take 20 mEq by mouth daily.     promethazine -dextromethorphan (PROMETHAZINE -DM) 6.25-15 MG/5ML syrup TAKE 5 ML BY MOUTH EVERY 4 HOURS     sucralfate  (CARAFATE ) 1 g tablet Take 1 tablet (1 g total) by mouth 4 (four) times daily -  with meals and at bedtime. (Patient not taking: Reported on 05/03/2024) 120 tablet 0   Current Facility-Administered Medications  Medication Dose Route Frequency Provider Last Rate Last Admin   ipratropium-albuterol  (DUONEB) 0.5-2.5 (3) MG/3ML nebulizer solution 3 mL  3 mL Nebulization Once Cranford, Tonya, NP        Allergies as of 05/03/2024 - Review Complete 05/03/2024  Allergen Reaction Noted   Morphine sulfate Palpitations 03/13/2008   Requip [ropinirole hcl] Shortness Of Breath and Nausea And Vomiting 03/27/2013   Amoxicillin Dermatitis and Rash 03/27/2013   Codeine Other (See Comments) 03/27/2013   Ezetimibe Other (See Comments) 03/27/2013   Minocycline Hives 06/06/2008   Spironolactone  Rash and Dermatitis 10/15/2021   Sulfa antibiotics Other (See Comments) 03/27/2013   Tetracyclines & related Hives 03/28/2013   Zestril [lisinopril] Cough 03/27/2013    Family History  Problem Relation Age of Onset   Heart attack Mother    Stroke Mother    COPD Father    Hypertension Father    Hyperlipidemia Sister    Breast cancer Sister        53s   Colon polyps Neg Hx    Esophageal cancer Neg Hx    Rectal cancer Neg Hx    Stomach cancer Neg Hx     Social History[1]   Review of Systems:    Constitutional: No fever, chills Cardiovascular: Chest discomfort during heartburn episodes Respiratory: No SOB Gastrointestinal: See HPI and otherwise  negative   Physical Exam:  Vital signs: BP 118/68   Pulse 79   Ht 5' 5 (1.651 m)   Wt 212 lb 2 oz (96.2 kg)   BMI 35.30 kg/m   Wt Readings from Last 3 Encounters:  05/03/24 212 lb 2 oz (96.2 kg)  03/22/24 214 lb 4 oz (97.2 kg)  02/03/24 220 lb 0.3 oz (99.8 kg)     Constitutional: Pleasant, obese female in NAD, alert and cooperative Head:  Normocephalic and atraumatic.  Respiratory: Respirations even and unlabored. Lungs clear to auscultation bilaterally.  No wheezes, crackles, or rhonchi.  Cardiovascular:  Regular rate and rhythm. No murmurs. No peripheral edema. Gastrointestinal:  Soft, nondistended, minimally tender to palpation of epigastrium. No rebound or guarding. Normal bowel sounds. No appreciable masses or hepatomegaly. Rectal:  Not performed.  Neurologic:  Alert and oriented x4;  grossly normal neurologically.  Skin:   Dry and intact without significant lesions or rashes. Psychiatric: Oriented to person, place and time. Demonstrates good judgement and reason without abnormal affect or behaviors.  Echocardiogram 04/05/2019 1. Left ventricular ejection fraction, by visual estimation, is 60 to 65% . There is no increased left ventricular wall thickness.  2. Abnormal septal motion consistent with left bundle branch block.  3. Left ventricular diastolic parameters are consistent with Grade I diastolic dysfunction ( impaired relaxation) .  4. Global right ventricle has normal systolc function. The right ventricular size is normal. no increase  in right ventricular wall thickness.  5. Left atrial size was mildly dilated.  6. The mitral valve is normal in structure. Trivial mitral valve regurgitation.  7. The pulmonic valve was normal in structure.  8. Normal pulmonary artery systolic pressure.  9. Frequent PVCs noted  10. The inferior vena cava is normal in size with greater than 50% respiratory variability, suggesting right atrial pressure of 3 mmHg.  11. The tricuspid valve  was normal in structure. Tricuspid valve regurgitation is trivial.  12. Tricuspid valve regurgitation is trivial.  Assessment/Plan:   Assessment & Plan Gastroesophageal reflux disease  Hiatal hernia Gastric polyp with intestinal metaplasia Patient with worsening heartburn/GERD symptoms since last office visit.  Had been on Protonix  twice daily at that time, but after reducing to once daily dosing 2 weeks ago started having breakthrough symptoms.  Heartburn can be severe at times with associated chest discomfort and occasionally intermittent epigastric pain as well.  Has not noticed any hematemesis, melena, overt GI bleeding.  Reports that prior to hospitalization was on omeprazole  twice a day.  - Increased pantoprazole  to 40 mg twice daily. - Discussed use of famotidine  at night for breakthrough symptoms. - Scheduled follow-up in 6 weeks - If symptoms persist consider earlier repeat EGD  Altered bowel habits with loose stools and urgency Gas and bloating Flatulence Persistent loose stools and urgency post-hospitalization.  No noticeable improvement with fiber supplementation.  Will have 2 episodes of loose stools in a day, rarely watery stools, and then may go a couple days without any bowel movements.  Does not feel constipated.  Denies rectal bleeding.  Has some associated gas and bloating/increased flatulence.  Prior to hospitalization was having fairly regular bowel movements daily.   Discussed possibility of SIBO, patient interested in breath testing so we will order this today.  Did advise her that if breath test is unrevealing and/or if symptoms do not improve by follow-up would recommend proceeding with colonoscopy.  There is currently a recall in for colonoscopy along with an upper endoscopy in April.  - Ordered breath test for small intestinal bacterial overgrowth. - Provided dietary recommendations to reduce gas and bloating, including avoidance of carbonated beverages and behaviors  that increase aerophagia. - Advised to report any new onset of hematochezia. - If symptoms persist plan for colonoscopy to be done with EGD in April  Iron deficiency anemia, post-GI bleed Iron deficiency anemia post-GI bleed, currently off iron therapy. Recent blood count near normal. She prefers to avoid iron supplementation.  - Ordered repeat blood count today to monitor for recurrence of anemia.  Gastric polyp with intestinal metaplasia Surveillance planned for gastric polyp with intestinal metaplasia.  - Confirmed recall for repeat upper endoscopy in April.    Camie Furbish, PA-C [facility] 05/03/2024, 2:46 PM  Patient Care Team: Beam, Lamar POUR, MD as PCP - General (Family Medicine) Anner Alm ORN, MD as PCP - Cardiology (Cardiology) Anner Alm ORN, MD as Consulting Physician (Cardiology) Yvone Rush, MD as Consulting Physician (Orthopedic Surgery) Ebbie Cough, MD as Consulting Physician (General Surgery) Izell Domino, MD as Attending Physician (Radiation Oncology)       [1]  Social History Tobacco Use   Smoking status: Never   Smokeless tobacco: Never  Vaping Use   Vaping status: Never Used  Substance Use Topics   Alcohol use: Yes    Comment: wine occasionally-rarely   Drug use: No   "

## 2024-05-04 ENCOUNTER — Ambulatory Visit: Payer: Self-pay | Admitting: Gastroenterology

## 2024-05-06 NOTE — Progress Notes (Signed)
"   Attending Physician's Attestation   I have reviewed the chart.   I agree with the Advanced Practitioner's note, impression, and recommendations with any updates as below. Consider updated colonoscopy for completion iron deficiency anemia work.  Can schedule this separately from upper endoscopy unless pain issues progressed where in which upper endoscopy may need to be repeated before colonoscopy is considered.   Aloha Finner, MD  Gastroenterology Advanced Endoscopy Office # [phone]  "

## 2024-05-07 ENCOUNTER — Telehealth: Payer: Self-pay | Admitting: Gastroenterology

## 2024-05-07 DIAGNOSIS — D72829 Elevated white blood cell count, unspecified: Secondary | ICD-10-CM

## 2024-05-07 NOTE — Telephone Encounter (Signed)
 The pt has been advised of the results.  She agrees to repeat labs- order has been entered.  She prefers to discuss endo colon at upcoming office visit.   She has been educated on signs and symptoms to watch for and when to go to the ED.  The pt has been advised of the information and verbalized understanding.

## 2024-05-07 NOTE — Telephone Encounter (Signed)
 Please call patient, does not appear that she has seen the message I sent regarding her lab results. Her CBC showed an elevated white blood cell count of 12.8 with elevated neutrophil count.  Can be a sign of infection or inflammation, however it  looks like she recently received a joint injection, and steroid injections can also cause this. If she is having any signs of infection such as fever, chills, shortness of breath, weakness, worsening joint pain at the site of the injection, etc., she should go to the ER.  Otherwise would like to have her come back for repeat labs this week to make sure that white blood cell count is downtrending. Recommend we check a CBC, CMP, lactic acid.  Additionally, please let her know that Dr. Wilhelmenia has recommended we consider a colonoscopy to complete her workup for iron deficiency anemia.  This would also allow us  to evaluate for other causes of her ongoing gas and bloating, and altered bowel habits with intermittent loose stools.  We can plan to do this together as scheduled in April, however if she continues to have issues with worsening reflux and associated pain from this, we should do the upper endoscopy separately and earlier than we originally planned.

## 2024-05-10 NOTE — Telephone Encounter (Signed)
 [name] [name] is it ok to give pt a work note for 05/08/24? She needed the day off to complete the SIBO testing.

## 2024-05-10 NOTE — Telephone Encounter (Signed)
 Work note completed and sent to the pt via My Chart

## 2024-05-16 ENCOUNTER — Other Ambulatory Visit

## 2024-05-16 DIAGNOSIS — D72829 Elevated white blood cell count, unspecified: Secondary | ICD-10-CM

## 2024-05-16 LAB — CBC WITH DIFFERENTIAL/PLATELET
Basophils Absolute: 0.1 10*3/uL (ref 0.0–0.1)
Basophils Relative: 0.8 % (ref 0.0–3.0)
Eosinophils Absolute: 0.3 10*3/uL (ref 0.0–0.7)
Eosinophils Relative: 5.3 % — ABNORMAL HIGH (ref 0.0–5.0)
HCT: 34.8 % — ABNORMAL LOW (ref 36.0–46.0)
Hemoglobin: 11.8 g/dL — ABNORMAL LOW (ref 12.0–15.0)
Lymphocytes Relative: 19.9 % (ref 12.0–46.0)
Lymphs Abs: 1.3 10*3/uL (ref 0.7–4.0)
MCHC: 33.8 g/dL (ref 30.0–36.0)
MCV: 87.2 fl (ref 78.0–100.0)
Monocytes Absolute: 0.6 10*3/uL (ref 0.1–1.0)
Monocytes Relative: 9 % (ref 3.0–12.0)
Neutro Abs: 4.3 10*3/uL (ref 1.4–7.7)
Neutrophils Relative %: 65 % (ref 43.0–77.0)
Platelets: 220 10*3/uL (ref 150.0–400.0)
RBC: 3.99 Mil/uL (ref 3.87–5.11)
RDW: 16.2 % — ABNORMAL HIGH (ref 11.5–15.5)
WBC: 6.6 10*3/uL (ref 4.0–10.5)

## 2024-05-16 LAB — COMPREHENSIVE METABOLIC PANEL WITH GFR
ALT: 10 U/L (ref 3–35)
AST: 13 U/L (ref 5–37)
Albumin: 4.2 g/dL (ref 3.5–5.2)
Alkaline Phosphatase: 83 U/L (ref 39–117)
BUN: 14 mg/dL (ref 6–23)
CO2: 27 meq/L (ref 19–32)
Calcium: 9.5 mg/dL (ref 8.4–10.5)
Chloride: 102 meq/L (ref 96–112)
Creatinine, Ser: 1.03 mg/dL (ref 0.40–1.20)
GFR: 52.22 mL/min — ABNORMAL LOW
Glucose, Bld: 111 mg/dL — ABNORMAL HIGH (ref 70–99)
Potassium: 3.5 meq/L (ref 3.5–5.1)
Sodium: 139 meq/L (ref 135–145)
Total Bilirubin: 0.4 mg/dL (ref 0.2–1.2)
Total Protein: 6.8 g/dL (ref 6.0–8.3)

## 2024-05-17 ENCOUNTER — Ambulatory Visit: Payer: Self-pay | Admitting: Gastroenterology

## 2024-06-14 ENCOUNTER — Ambulatory Visit: Admitting: Gastroenterology
